# Patient Record
Sex: Female | Born: 1945 | Race: White | Hispanic: No | Smoking: Former smoker
Health system: Southern US, Community
[De-identification: ages and names within clinical notes are randomized; demographics above are authoritative.]

## PROBLEM LIST (undated history)

## (undated) DIAGNOSIS — T7840XA Allergy, unspecified, initial encounter: Secondary | ICD-10-CM

## (undated) DIAGNOSIS — M8580 Other specified disorders of bone density and structure, unspecified site: Secondary | ICD-10-CM

## (undated) DIAGNOSIS — IMO0001 Reserved for inherently not codable concepts without codable children: Secondary | ICD-10-CM

## (undated) DIAGNOSIS — E559 Vitamin D deficiency, unspecified: Secondary | ICD-10-CM

## (undated) DIAGNOSIS — C801 Malignant (primary) neoplasm, unspecified: Secondary | ICD-10-CM

## (undated) DIAGNOSIS — Z880 Allergy status to penicillin: Secondary | ICD-10-CM

## (undated) DIAGNOSIS — Z5189 Encounter for other specified aftercare: Secondary | ICD-10-CM

## (undated) DIAGNOSIS — E039 Hypothyroidism, unspecified: Secondary | ICD-10-CM

## (undated) DIAGNOSIS — E785 Hyperlipidemia, unspecified: Secondary | ICD-10-CM

## (undated) DIAGNOSIS — R809 Proteinuria, unspecified: Secondary | ICD-10-CM

## (undated) DIAGNOSIS — S9305XA Dislocation of left ankle joint, initial encounter: Secondary | ICD-10-CM

## (undated) DIAGNOSIS — I6522 Occlusion and stenosis of left carotid artery: Secondary | ICD-10-CM

## (undated) DIAGNOSIS — G629 Polyneuropathy, unspecified: Secondary | ICD-10-CM

## (undated) DIAGNOSIS — E079 Disorder of thyroid, unspecified: Secondary | ICD-10-CM

## (undated) DIAGNOSIS — Z860100 Personal history of colon polyps, unspecified: Secondary | ICD-10-CM

## (undated) DIAGNOSIS — Z87442 Personal history of urinary calculi: Secondary | ICD-10-CM

## (undated) DIAGNOSIS — B351 Tinea unguium: Secondary | ICD-10-CM

## (undated) DIAGNOSIS — L570 Actinic keratosis: Secondary | ICD-10-CM

## (undated) DIAGNOSIS — G4730 Sleep apnea, unspecified: Secondary | ICD-10-CM

## (undated) DIAGNOSIS — R112 Nausea with vomiting, unspecified: Secondary | ICD-10-CM

## (undated) DIAGNOSIS — M1990 Unspecified osteoarthritis, unspecified site: Secondary | ICD-10-CM

## (undated) DIAGNOSIS — Z9889 Other specified postprocedural states: Secondary | ICD-10-CM

## (undated) HISTORY — DX: Disorder of thyroid, unspecified: E07.9

## (undated) HISTORY — DX: Other specified disorders of bone density and structure, unspecified site: M85.80

## (undated) HISTORY — DX: Actinic keratosis: L57.0

## (undated) HISTORY — PX: COLONOSCOPY: SHX174

## (undated) HISTORY — PX: POLYPECTOMY: SHX149

## (undated) HISTORY — PX: EYE SURGERY: SHX253

## (undated) HISTORY — DX: Personal history of colon polyps, unspecified: Z86.0100

## (undated) HISTORY — DX: Unspecified osteoarthritis, unspecified site: M19.90

## (undated) HISTORY — DX: Vitamin D deficiency, unspecified: E55.9

## (undated) HISTORY — DX: Proteinuria, unspecified: R80.9

## (undated) HISTORY — DX: Hyperlipidemia, unspecified: E78.5

## (undated) HISTORY — PX: TOTAL ABDOMINAL HYSTERECTOMY W/ BILATERAL SALPINGOOPHORECTOMY: SHX83

## (undated) HISTORY — DX: Occlusion and stenosis of left carotid artery: I65.22

## (undated) HISTORY — DX: Tinea unguium: B35.1

## (undated) HISTORY — DX: Polyneuropathy, unspecified: G62.9

## (undated) HISTORY — DX: Allergy status to penicillin: Z88.0

## (undated) HISTORY — PX: LAPAROSCOPIC GASTRIC BANDING: SHX1100

## (undated) HISTORY — DX: Reserved for inherently not codable concepts without codable children: IMO0001

## (undated) HISTORY — DX: Allergy, unspecified, initial encounter: T78.40XA

## (undated) HISTORY — DX: Encounter for other specified aftercare: Z51.89

## (undated) HISTORY — PX: WISDOM TOOTH EXTRACTION: SHX21

---

## 1978-10-13 HISTORY — PX: TOTAL ABDOMINAL HYSTERECTOMY W/ BILATERAL SALPINGOOPHORECTOMY: SHX83

## 1998-08-13 ENCOUNTER — Encounter: Admission: RE | Admit: 1998-08-13 | Discharge: 1998-11-11 | Payer: Self-pay | Admitting: Internal Medicine

## 1998-09-03 ENCOUNTER — Ambulatory Visit (HOSPITAL_COMMUNITY): Admission: RE | Admit: 1998-09-03 | Discharge: 1998-09-03 | Payer: Self-pay | Admitting: Internal Medicine

## 1999-05-16 ENCOUNTER — Ambulatory Visit (HOSPITAL_COMMUNITY): Admission: RE | Admit: 1999-05-16 | Discharge: 1999-05-16 | Payer: Self-pay | Admitting: Internal Medicine

## 1999-05-16 ENCOUNTER — Encounter: Payer: Self-pay | Admitting: Internal Medicine

## 1999-09-03 ENCOUNTER — Encounter (INDEPENDENT_AMBULATORY_CARE_PROVIDER_SITE_OTHER): Payer: Self-pay

## 1999-09-03 ENCOUNTER — Ambulatory Visit (HOSPITAL_COMMUNITY): Admission: RE | Admit: 1999-09-03 | Discharge: 1999-09-03 | Payer: Self-pay | Admitting: *Deleted

## 1999-09-24 ENCOUNTER — Other Ambulatory Visit: Admission: RE | Admit: 1999-09-24 | Discharge: 1999-09-24 | Payer: Self-pay | Admitting: Internal Medicine

## 2001-08-19 ENCOUNTER — Ambulatory Visit (HOSPITAL_COMMUNITY): Admission: RE | Admit: 2001-08-19 | Discharge: 2001-08-19 | Payer: Self-pay | Admitting: Internal Medicine

## 2001-08-19 ENCOUNTER — Encounter: Payer: Self-pay | Admitting: Internal Medicine

## 2001-11-13 ENCOUNTER — Emergency Department (HOSPITAL_COMMUNITY): Admission: EM | Admit: 2001-11-13 | Discharge: 2001-11-14 | Payer: Self-pay | Admitting: Emergency Medicine

## 2001-11-14 ENCOUNTER — Emergency Department (HOSPITAL_COMMUNITY): Admission: EM | Admit: 2001-11-14 | Discharge: 2001-11-14 | Payer: Self-pay | Admitting: Emergency Medicine

## 2001-11-14 ENCOUNTER — Encounter: Payer: Self-pay | Admitting: Emergency Medicine

## 2001-11-17 ENCOUNTER — Encounter: Admission: RE | Admit: 2001-11-17 | Discharge: 2001-11-17 | Payer: Self-pay | Admitting: Urology

## 2001-11-17 ENCOUNTER — Encounter: Payer: Self-pay | Admitting: Urology

## 2001-11-18 ENCOUNTER — Ambulatory Visit (HOSPITAL_BASED_OUTPATIENT_CLINIC_OR_DEPARTMENT_OTHER): Admission: RE | Admit: 2001-11-18 | Discharge: 2001-11-18 | Payer: Self-pay | Admitting: Urology

## 2001-12-29 ENCOUNTER — Encounter: Admission: RE | Admit: 2001-12-29 | Discharge: 2002-01-03 | Payer: Self-pay | Admitting: Pediatrics

## 2002-08-30 ENCOUNTER — Ambulatory Visit (HOSPITAL_COMMUNITY): Admission: RE | Admit: 2002-08-30 | Discharge: 2002-08-30 | Payer: Self-pay | Admitting: Internal Medicine

## 2002-08-30 ENCOUNTER — Encounter: Payer: Self-pay | Admitting: Internal Medicine

## 2003-03-15 ENCOUNTER — Emergency Department (HOSPITAL_COMMUNITY): Admission: EM | Admit: 2003-03-15 | Discharge: 2003-03-15 | Payer: Self-pay | Admitting: Emergency Medicine

## 2003-03-15 ENCOUNTER — Encounter: Payer: Self-pay | Admitting: Emergency Medicine

## 2005-03-19 ENCOUNTER — Encounter: Admission: RE | Admit: 2005-03-19 | Discharge: 2005-06-17 | Payer: Self-pay | Admitting: Internal Medicine

## 2005-04-09 ENCOUNTER — Encounter: Admission: RE | Admit: 2005-04-09 | Discharge: 2005-04-09 | Payer: Self-pay | Admitting: Internal Medicine

## 2005-04-09 IMAGING — RF DG ESOPHAGUS
11 of 16 series · 15 of 24 positions shown · non-contrast
Comparison: none

CLINICAL DATA: Dysphagia.
 DOUBLE CONTRAST ESOPHAGRAM ? [DATE]:
 No prior studies.

[Series 1: run · 3 of 8 slices shown (1 of 11)]
[im 1/8]
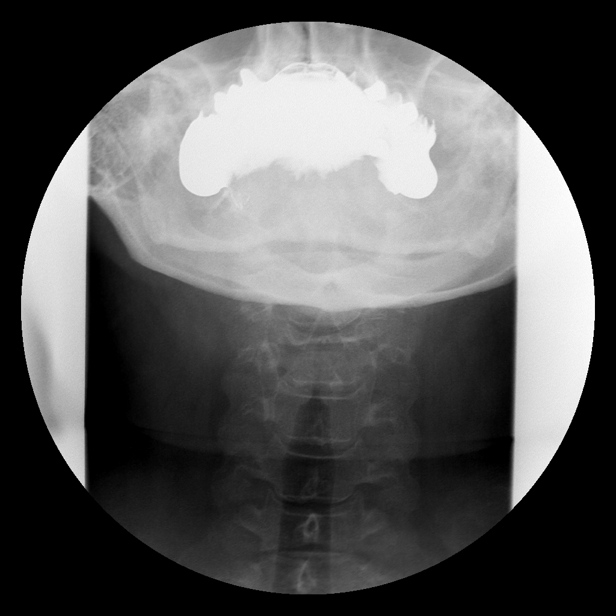
[im 4/8]
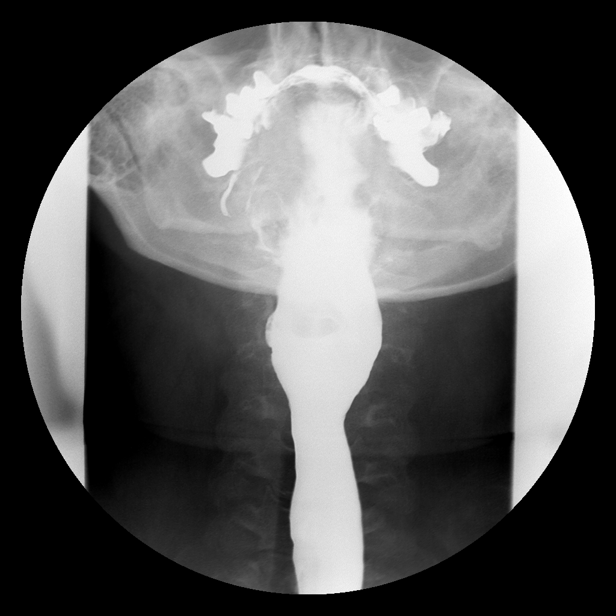
[im 8/8]
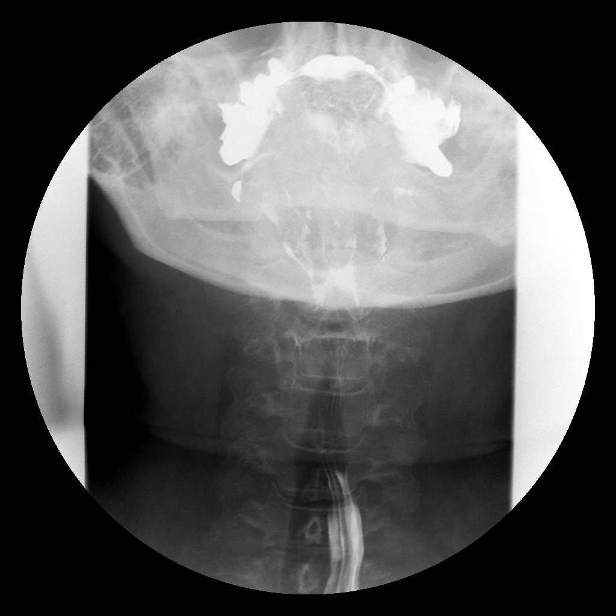

[Series 2: run · 3 of 8 slices shown (2 of 11)]
[im 1/8]
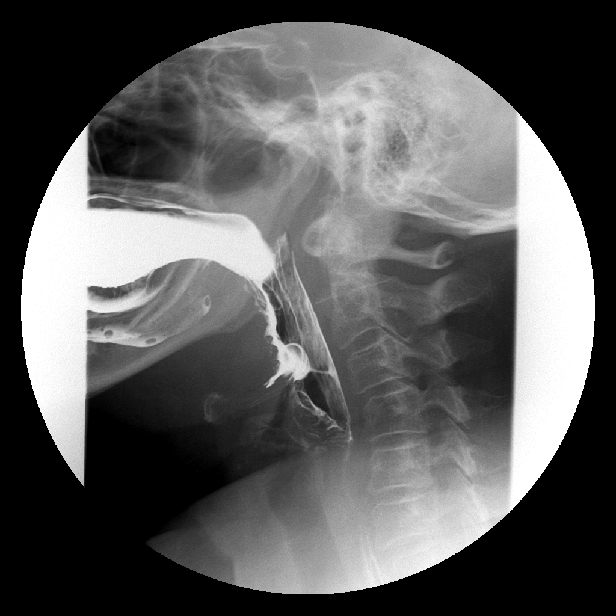
[im 4/8]
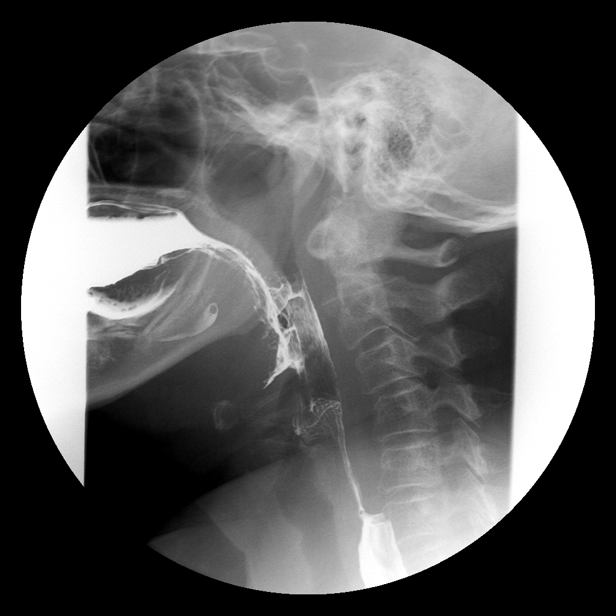
[im 6/8]
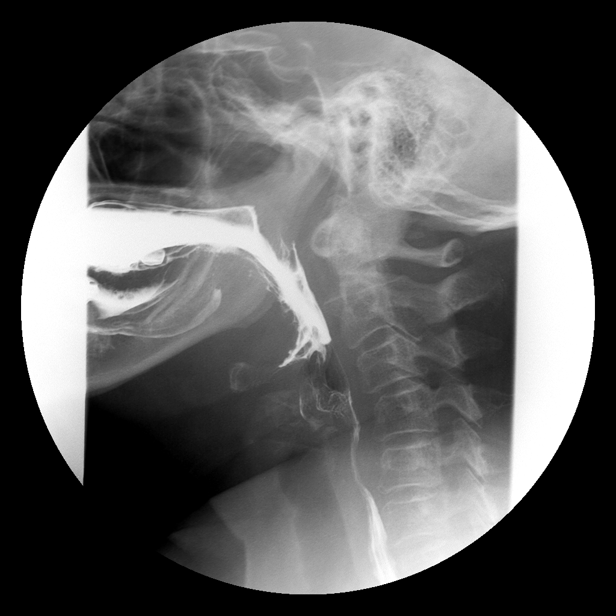

[Series 3: run · 1 of 1 slices shown (3 of 11)]
[im 1/1]
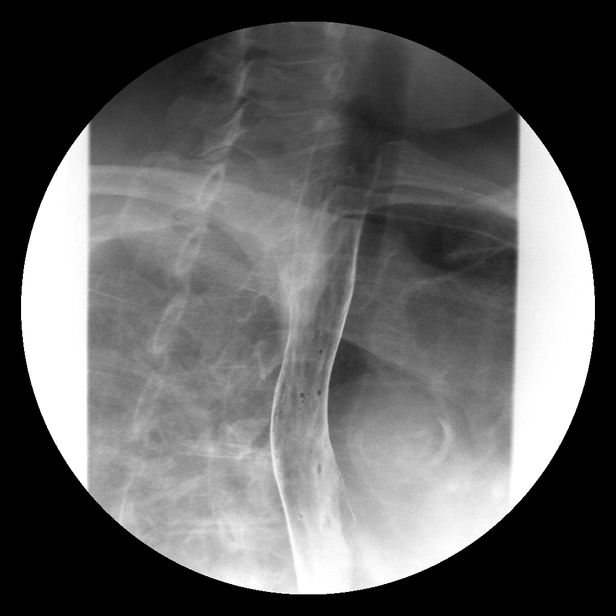

[Series 5: run · 1 of 1 slices shown (4 of 11)]
[im 1/1]
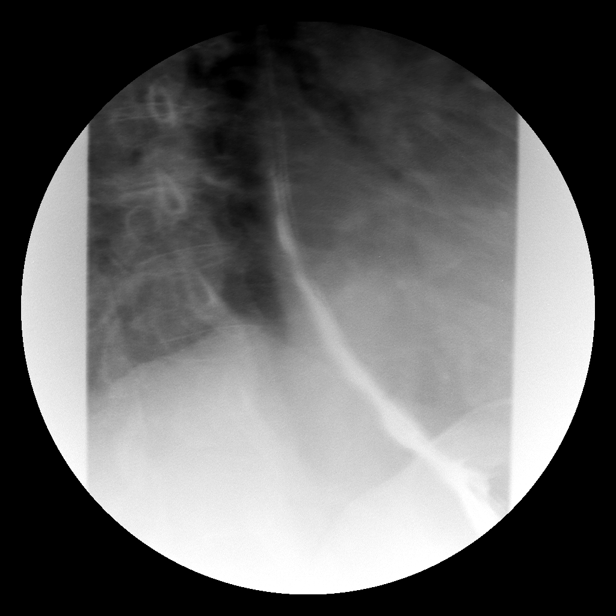

[Series 6: run · 1 of 1 slices shown (5 of 11)]
[im 1/1]
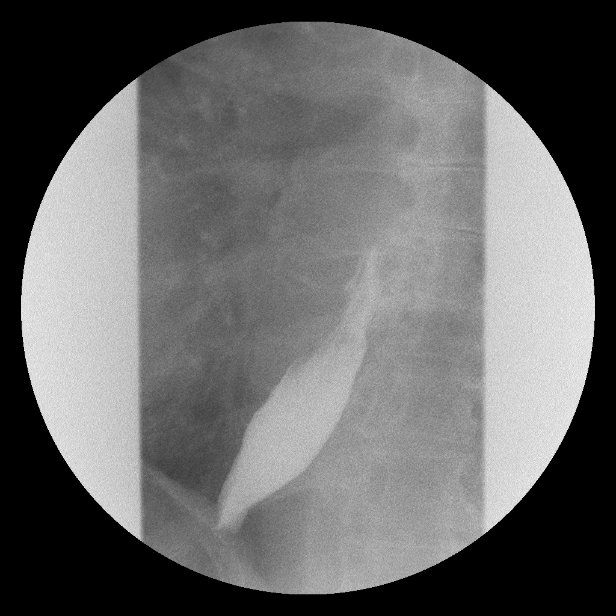

[Series 8: run · 1 of 1 slices shown (6 of 11)]
[im 1/1]
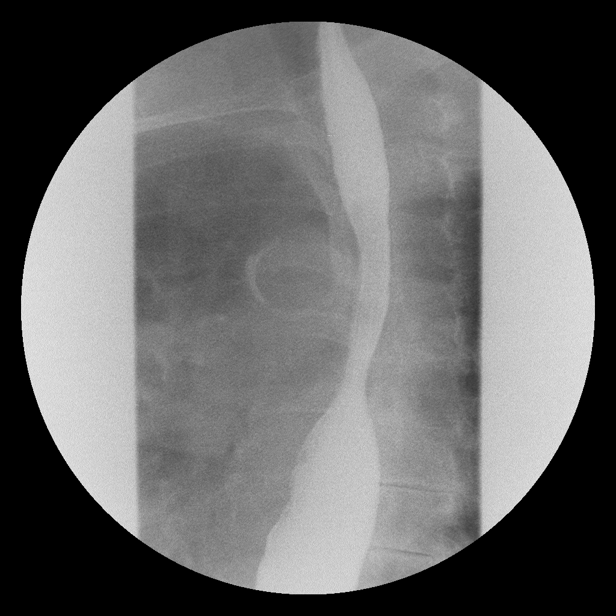

[Series 9: run · 1 of 1 slices shown (7 of 11)]
[im 1/1]
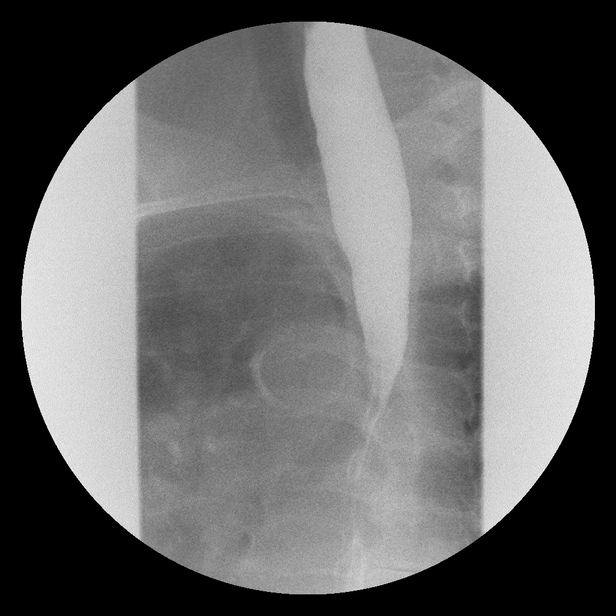

[Series 11: run · 1 of 1 slices shown (8 of 11)]
[im 1/1]
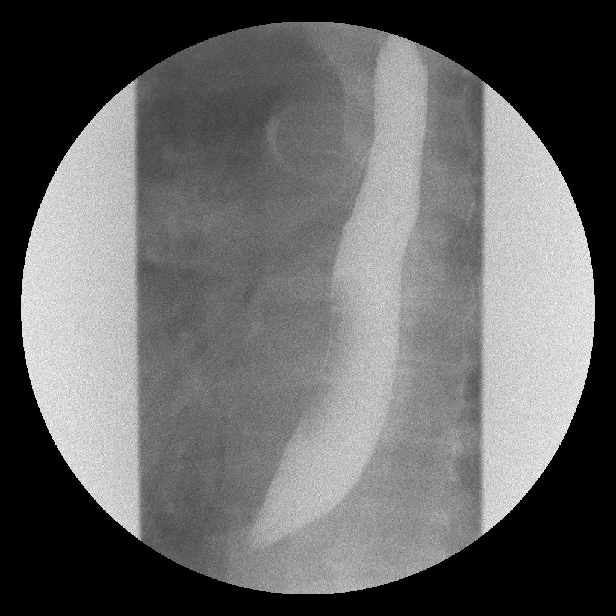

[Series 13: run · 1 of 1 slices shown (9 of 11)]
[im 1/1]
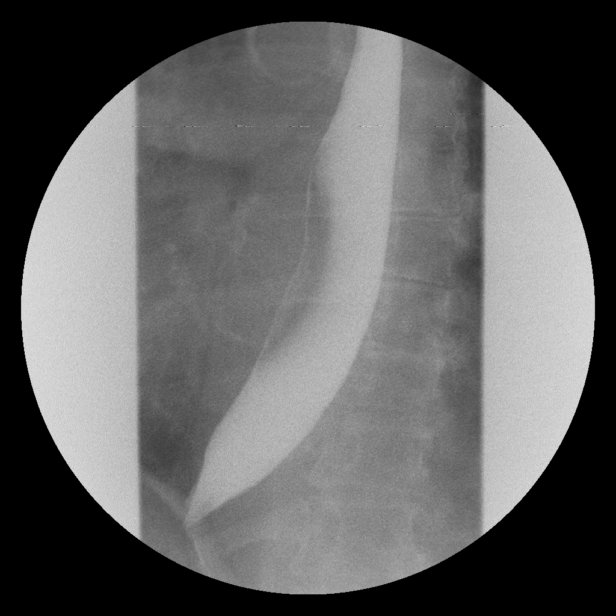

[Series 14: run · 1 of 1 slices shown (10 of 11)]
[im 1/1]
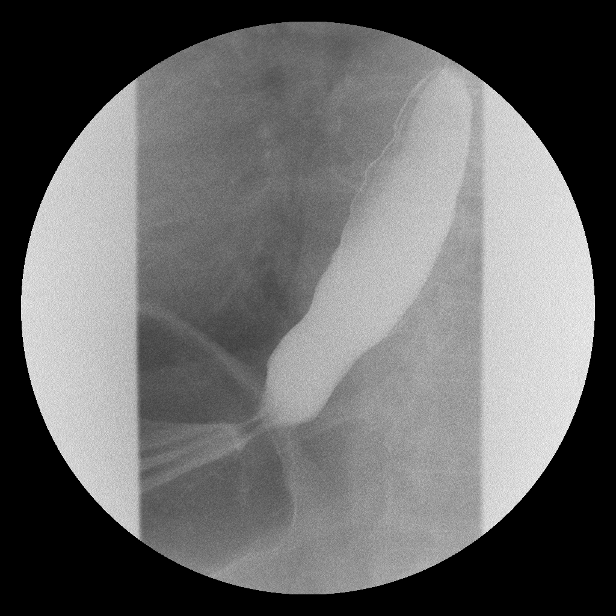

[Series 16: run · 1 of 1 slices shown (11 of 11)]
[im 1/1]
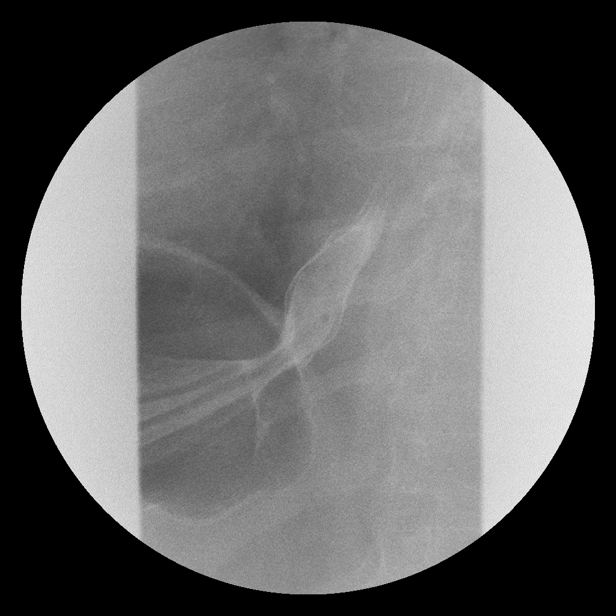

[15 of 24 positions shown; findings below may reference images not displayed]

FINDINGS: There is a moderately prominent cricopharyngeus.  The pharyngeal phase of swallowing appears otherwise unremarkable.  
 Atherosclerotic calcification of the thoracic aorta is noted.  No significant narrowing of the thoracic esophagus. Primary peristaltic waves in the esophagus were normal on four out of four swallows.  There is a very small sliding type hiatal hernia.
IMPRESSION: 1.  Very small transient hiatal hernia.

## 2005-05-19 ENCOUNTER — Ambulatory Visit (HOSPITAL_COMMUNITY): Admission: RE | Admit: 2005-05-19 | Discharge: 2005-05-19 | Payer: Self-pay | Admitting: *Deleted

## 2005-05-30 ENCOUNTER — Encounter: Admission: RE | Admit: 2005-05-30 | Discharge: 2005-08-28 | Payer: Self-pay | Admitting: *Deleted

## 2005-09-16 ENCOUNTER — Encounter (INDEPENDENT_AMBULATORY_CARE_PROVIDER_SITE_OTHER): Payer: Self-pay | Admitting: *Deleted

## 2005-09-16 ENCOUNTER — Ambulatory Visit (HOSPITAL_COMMUNITY): Admission: RE | Admit: 2005-09-16 | Discharge: 2005-09-17 | Payer: Self-pay | Admitting: *Deleted

## 2005-09-17 IMAGING — CR DG UGI W/ GASTROGRAFIN
11 series · 11 of 11 positions shown · non-contrast
Comparison: none

CLINICAL DATA: Morbid obesity. Gastric banding.  
UPPER GI WITH GASTROGRAFIN:

[run (1 of 9)]
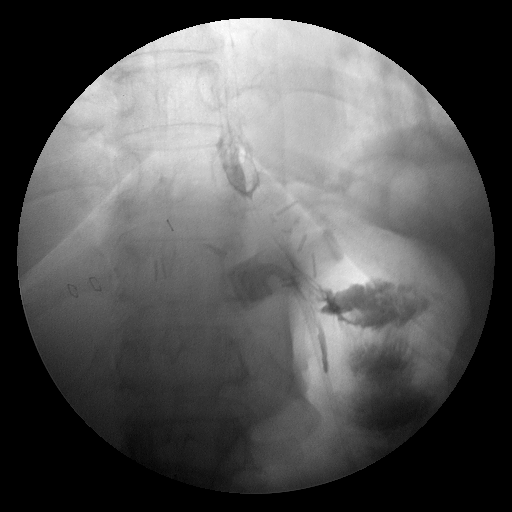

[run (2 of 9)]
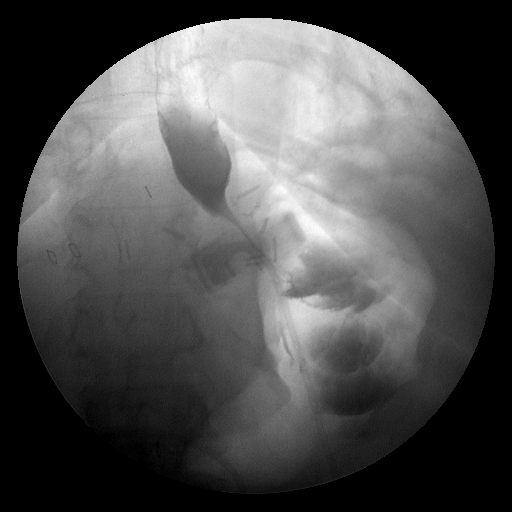

[run (3 of 9)]
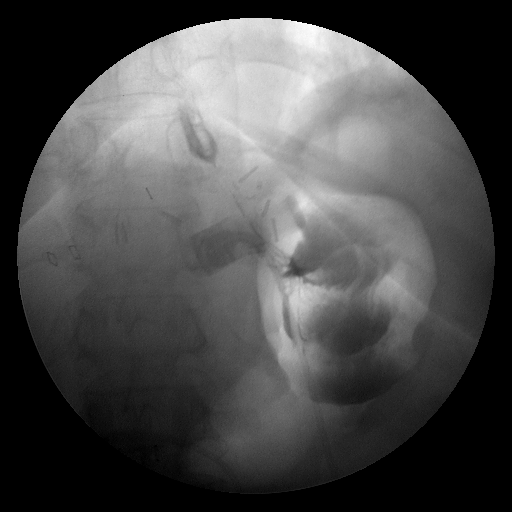

[run (4 of 9)]
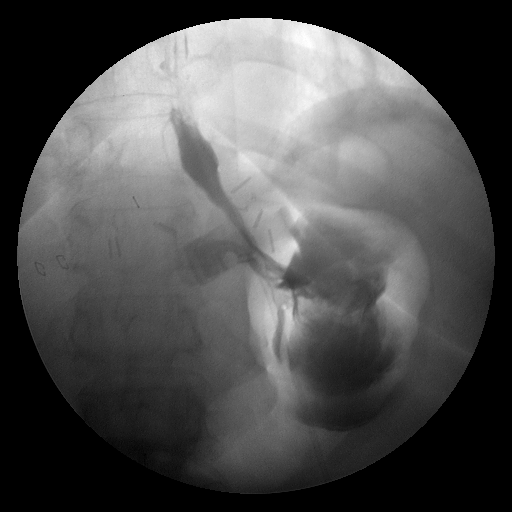

[run (5 of 9)]
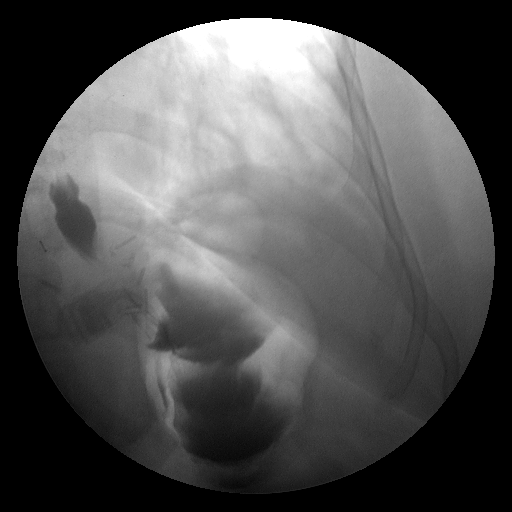

[run (6 of 9)]
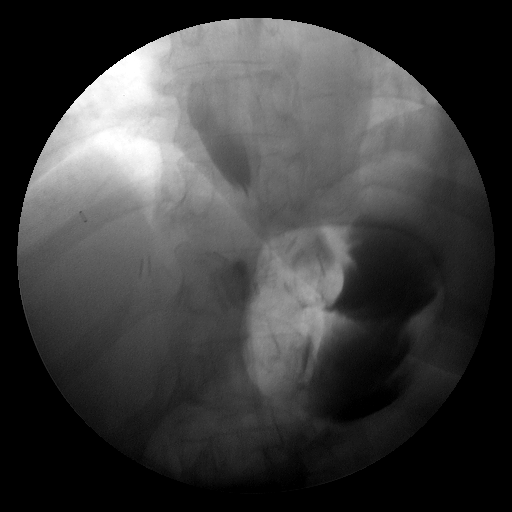

[run (7 of 9)]
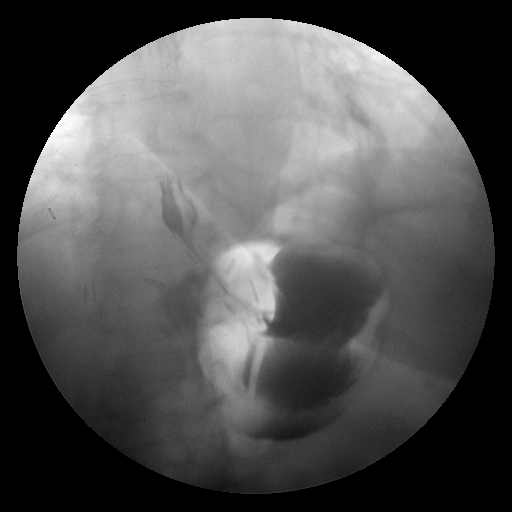

[run (8 of 9)]
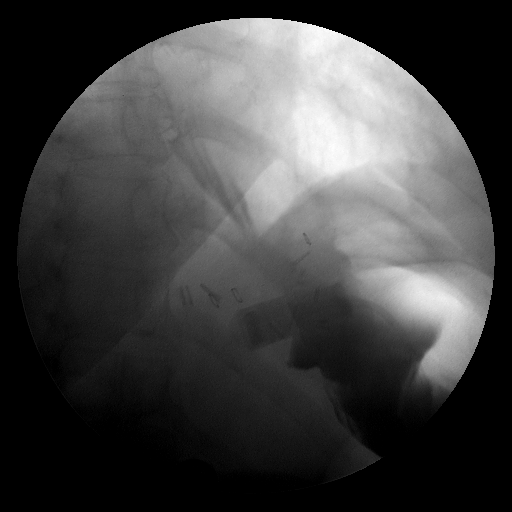

[run (9 of 9)]
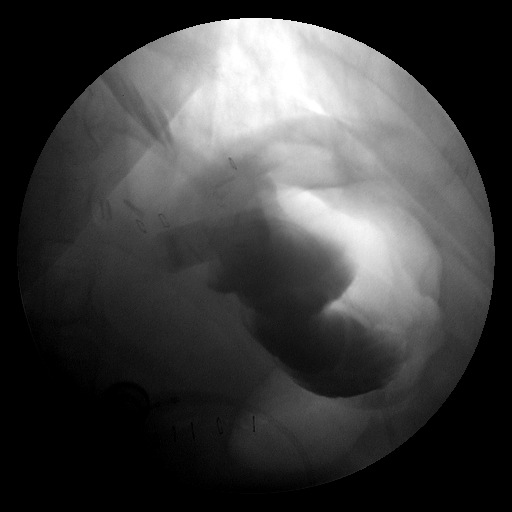

[view not recorded (1 of 2)]
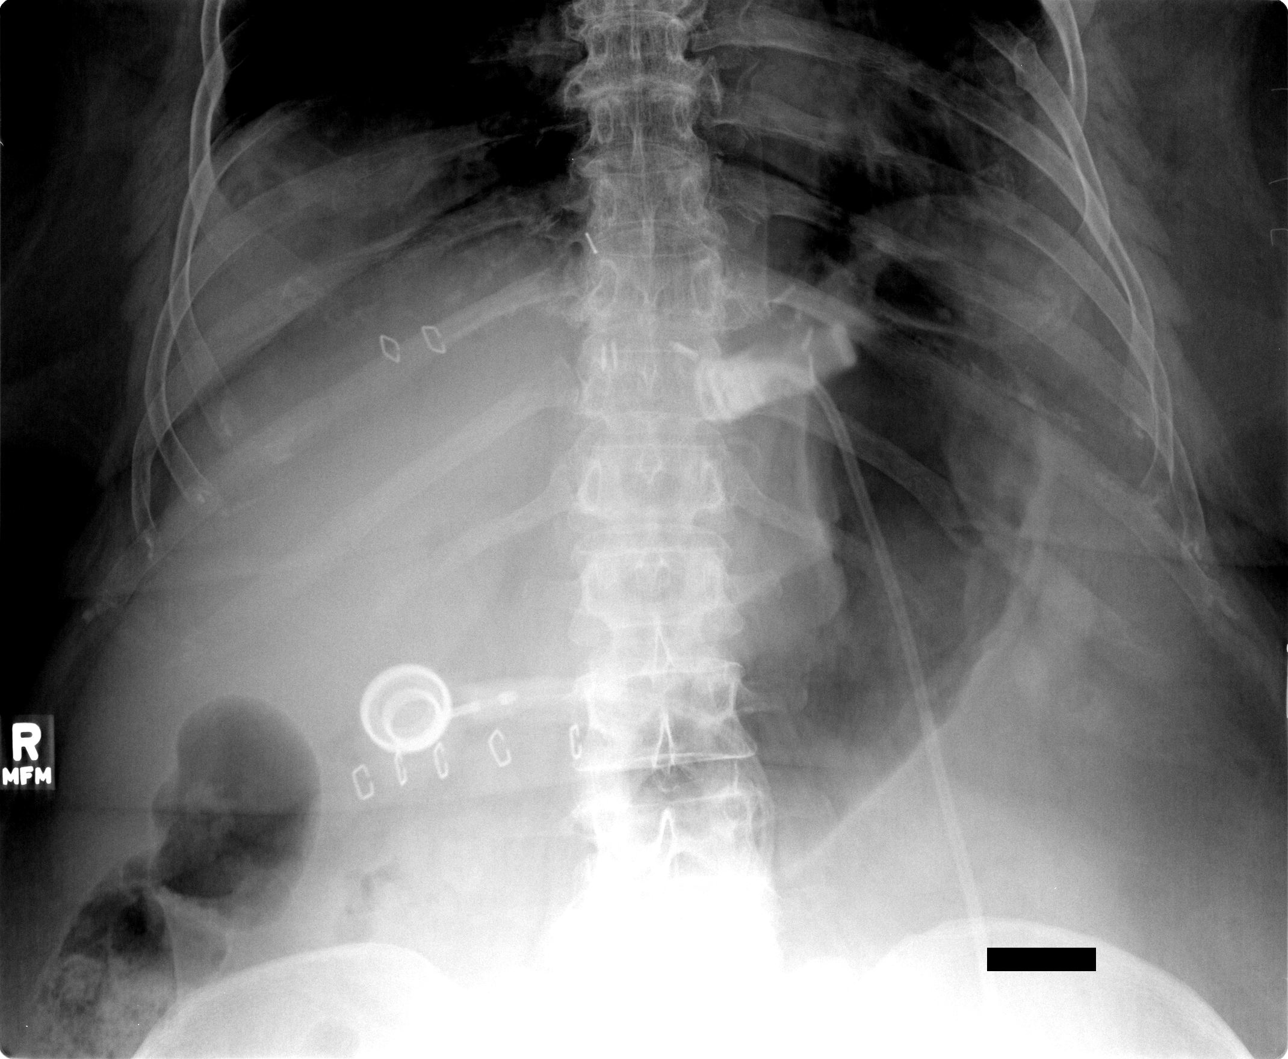

[view not recorded (2 of 2)]
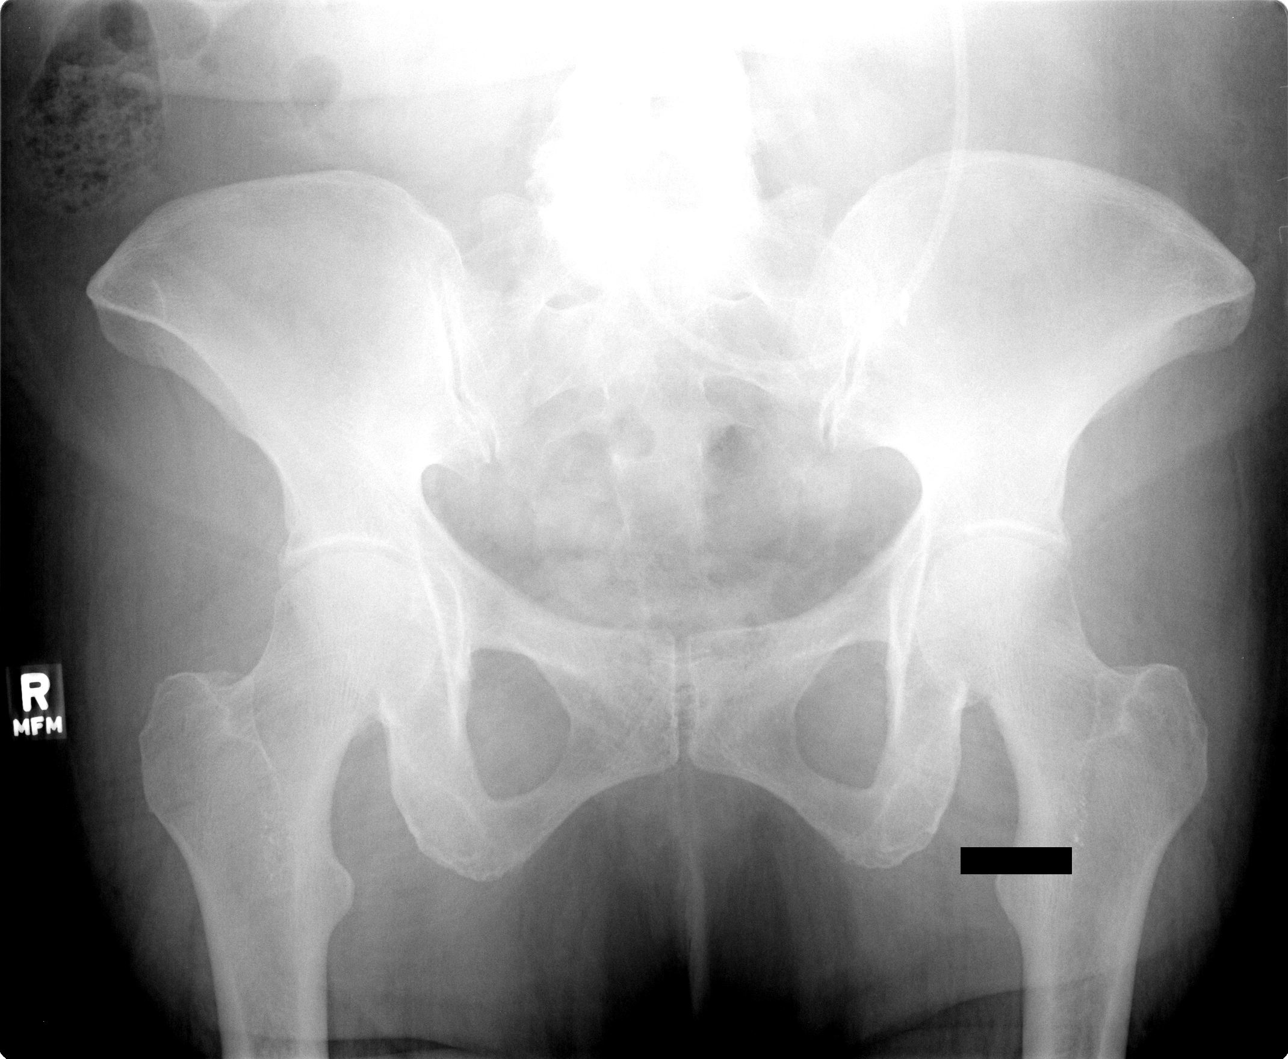

[11 of 11 positions shown; findings below may reference images not displayed]

FINDINGS: The patient was studied in semierect position under fluoroscopic control.     She is status post laparoscopic banding of the stomach.  Small and large gastric pouches are separated by gastric band.  There is no evidence of contrast extravasation or significant delay in transit of Gastrografin.
IMPRESSION: Status post gastric banding with no complications noted.

## 2005-09-26 ENCOUNTER — Encounter: Admission: RE | Admit: 2005-09-26 | Discharge: 2005-12-25 | Payer: Self-pay | Admitting: *Deleted

## 2005-10-20 ENCOUNTER — Ambulatory Visit: Payer: Self-pay | Admitting: Internal Medicine

## 2005-12-22 ENCOUNTER — Ambulatory Visit: Payer: Self-pay | Admitting: Internal Medicine

## 2006-02-10 ENCOUNTER — Encounter: Admission: RE | Admit: 2006-02-10 | Discharge: 2006-02-10 | Payer: Self-pay | Admitting: *Deleted

## 2006-07-30 ENCOUNTER — Encounter: Admission: RE | Admit: 2006-07-30 | Discharge: 2006-10-28 | Payer: Self-pay | Admitting: Internal Medicine

## 2007-11-25 ENCOUNTER — Encounter: Admission: RE | Admit: 2007-11-25 | Discharge: 2007-11-25 | Payer: Self-pay | Admitting: Internal Medicine

## 2007-11-25 IMAGING — US US SOFT TISSUE HEAD/NECK
1 series · 14 of 25 positions shown · non-contrast
Comparison: none

CLINICAL DATA: Goiter.
THYROID ULTRASOUND:
TECHNIQUE: Ultrasound examination of the thyroid gland and adjacent soft tissue structures was performed.
Right and left thyroid lobes measure 3.6 cm and 3.4 cm in length, respectively.  Right thyroid lobe measures 0.8 x 1.0 cm in transverse dimensions.  Left thyroid lobe measures 0.8 x 1.3 cm in transverse dimension.  The thyroid isthmus measures 3 mm in thickness.  Mild diffuse echotextural inhomogeneity of the thyroid gland.  The gland does not appear enlarged.  There is a small (5 x 4 x 5 mm) solid appearing nodule in the posterior aspect of the mid right thyroid lobe.  It is nearly isoechoic with the adjacent parenchyma.

[Series 1: us soft tissue head/neck · 0.08mm/px · 14 of 26 slices shown]
[im 1/26]
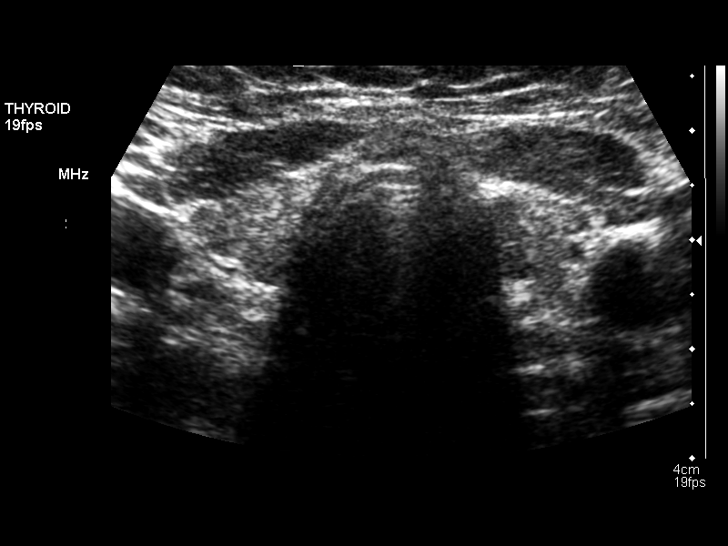
[im 3/26]
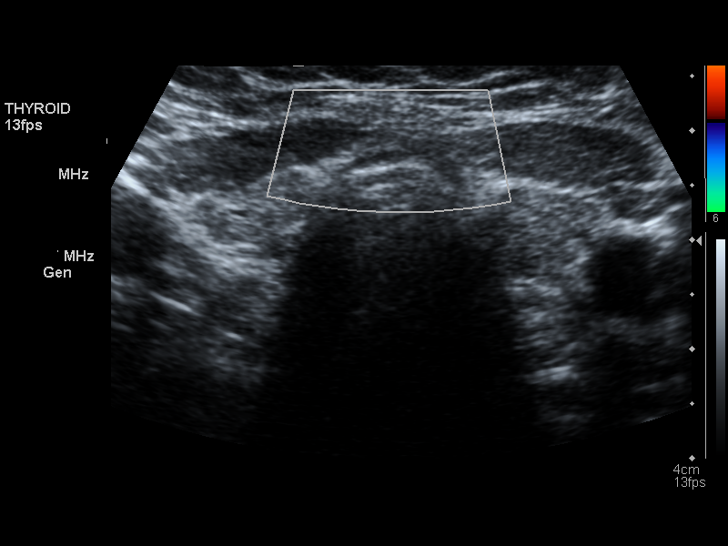
[im 5/26]
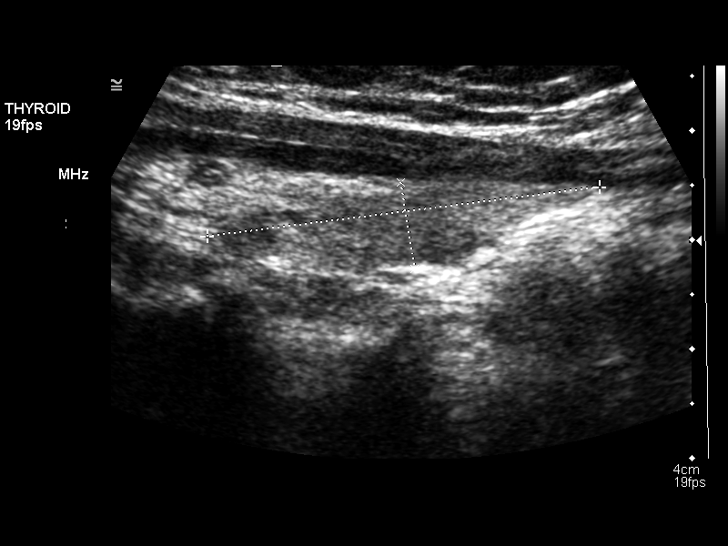
[im 7/26]
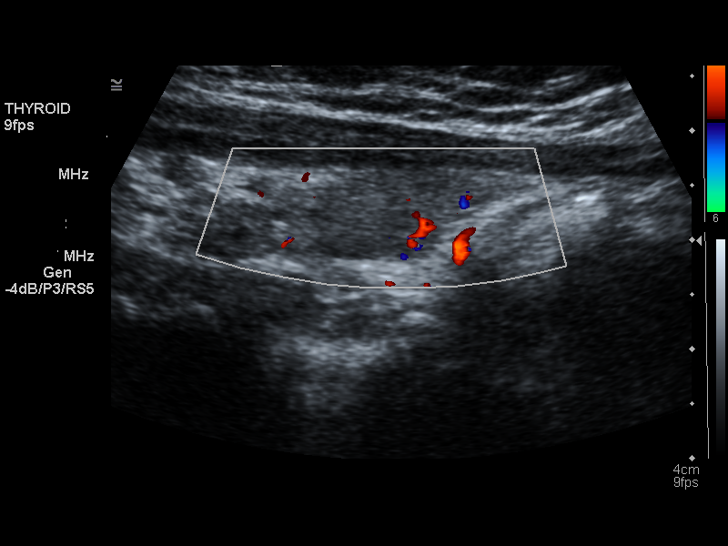
[im 9/26]
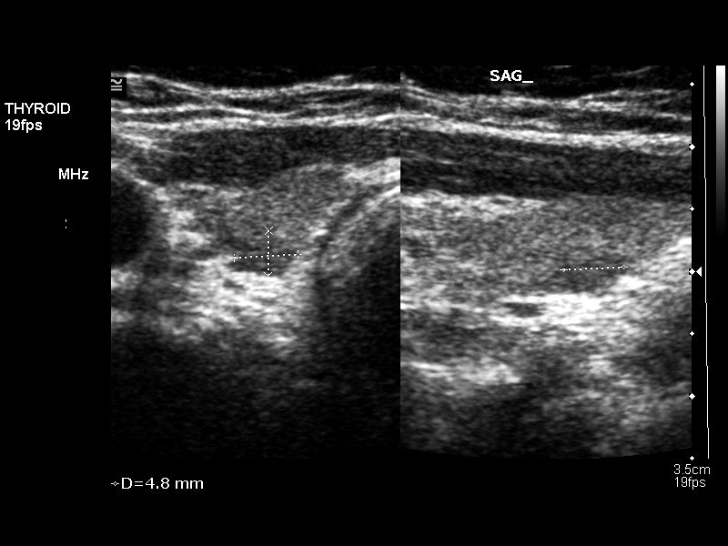
[im 10/26]
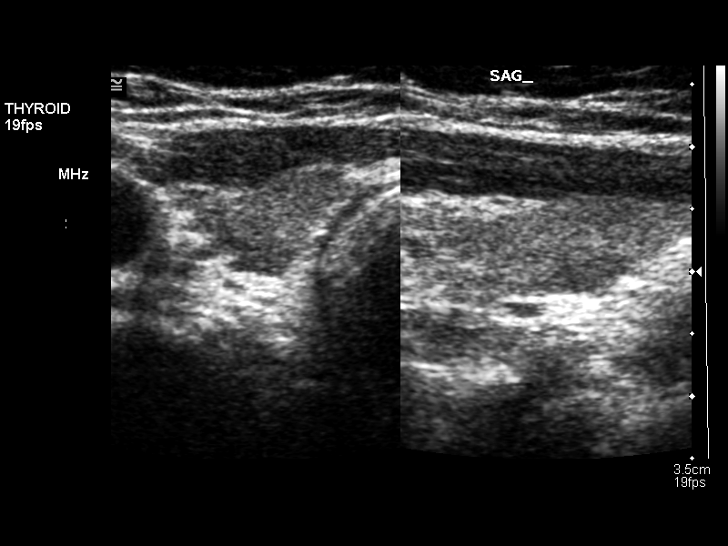
[im 12/26]
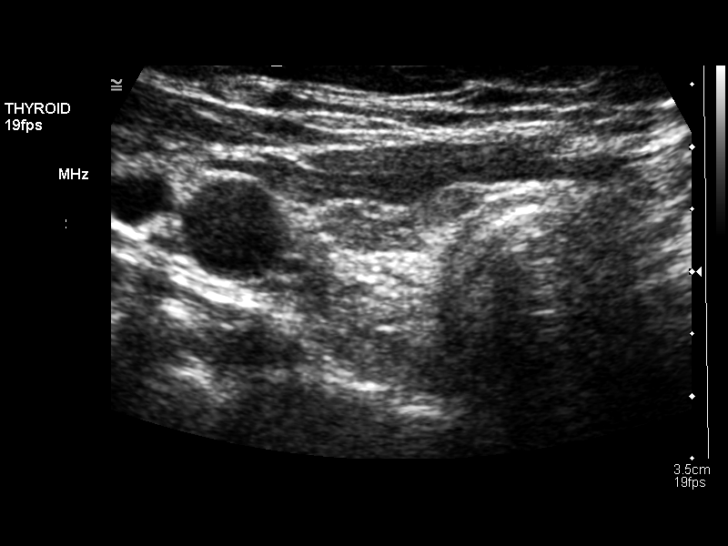
[im 14/26]
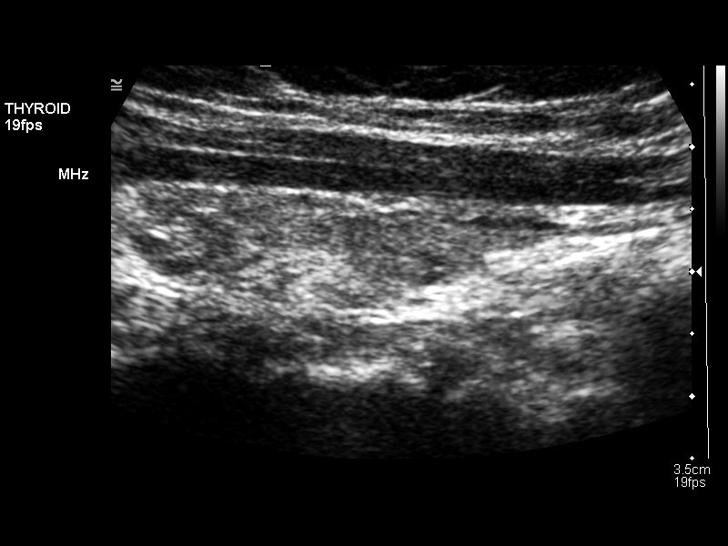
[im 16/26]
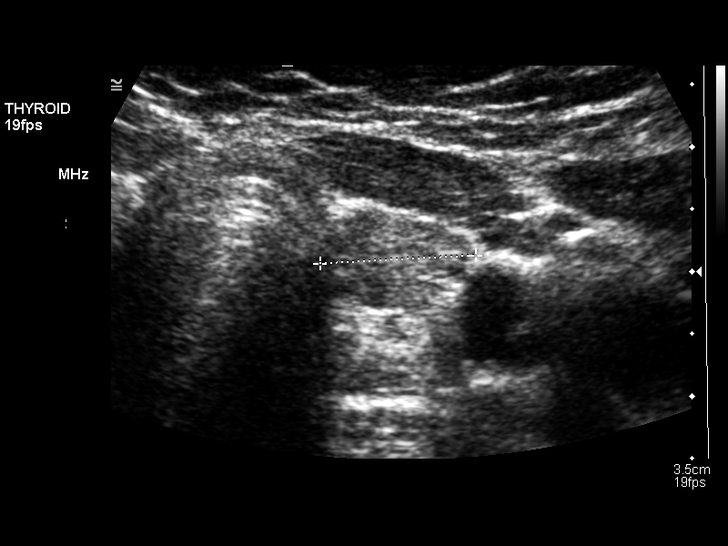
[im 17/26]
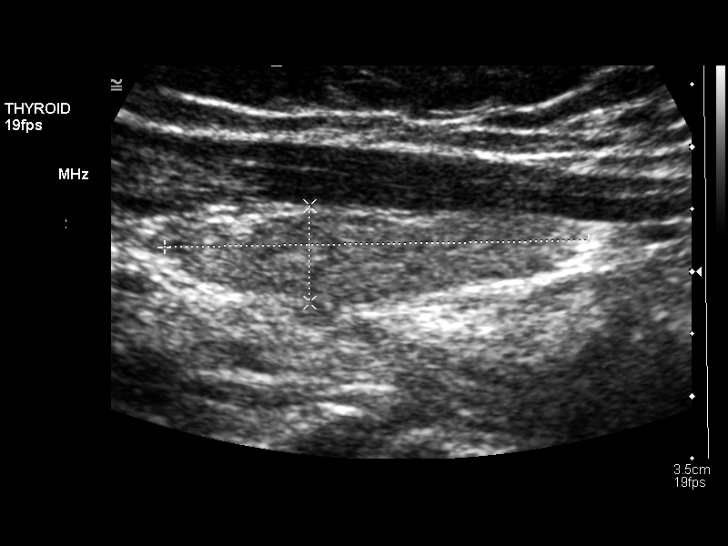
[im 19/26]
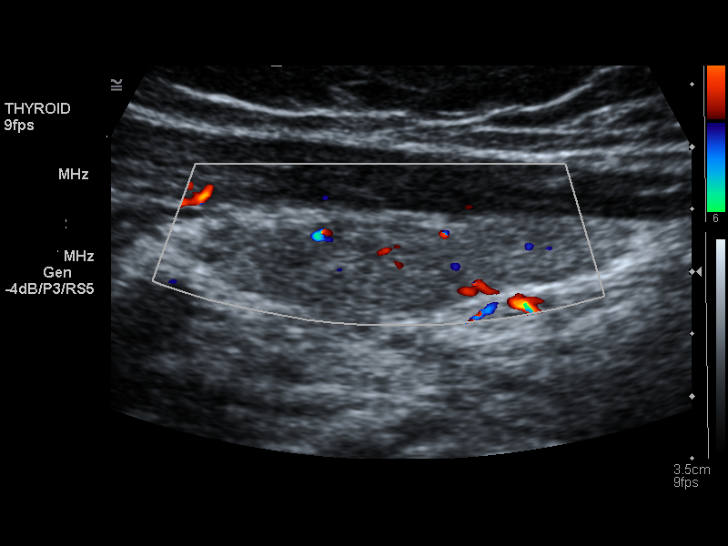
[im 21/26]
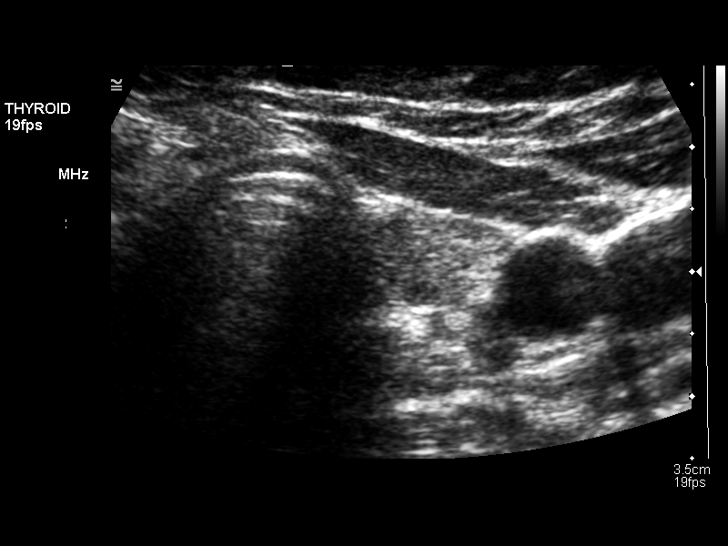
[im 23/26]
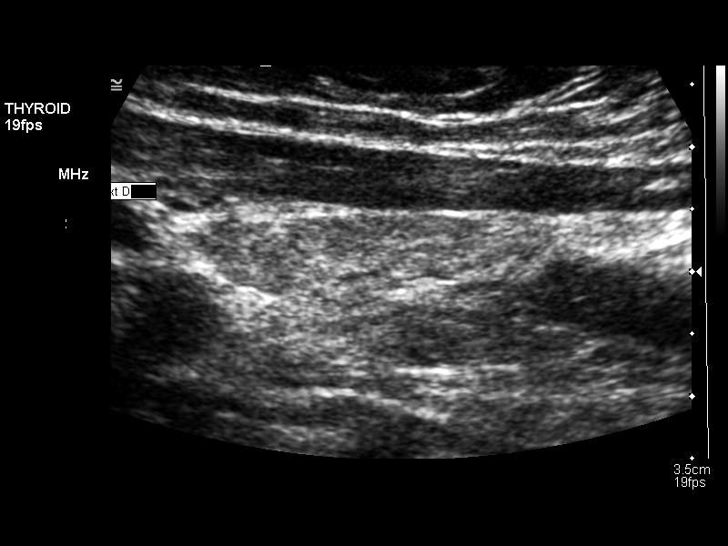
[im 26/26]
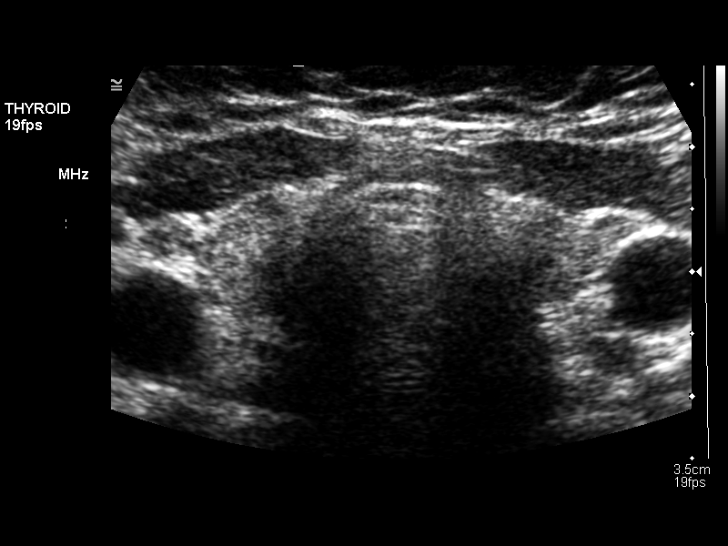

[14 of 25 positions shown; findings below may reference images not displayed]

IMPRESSION: The thyroid gland does not appear enlarged, but has a mildly diffuse inhomogeneous echotextural pattern plus there is a small right lobe nodular focus.  This may simply represent a focus of minimally altered echotexture/echogenicity as opposed to representing a true nodule.

## 2008-10-13 HISTORY — PX: LAPAROSCOPIC GASTRIC BANDING: SHX1100

## 2008-10-24 ENCOUNTER — Encounter: Admission: RE | Admit: 2008-10-24 | Discharge: 2008-10-24 | Payer: Self-pay | Admitting: Internal Medicine

## 2008-10-24 IMAGING — US US SOFT TISSUE HEAD/NECK
1 series · 14 of 25 positions shown · non-contrast
Comparison: [DATE].

CLINICAL DATA: Follow-up thyroid nodule

THYROID ULTRASOUND
TECHNIQUE: Ultrasound examination of the thyroid gland and
adjacent soft tissues was performed.

[Series 1: us soft tissue head/neck · 0.08mm/px · 14 of 31 slices shown]
[im 1/31]
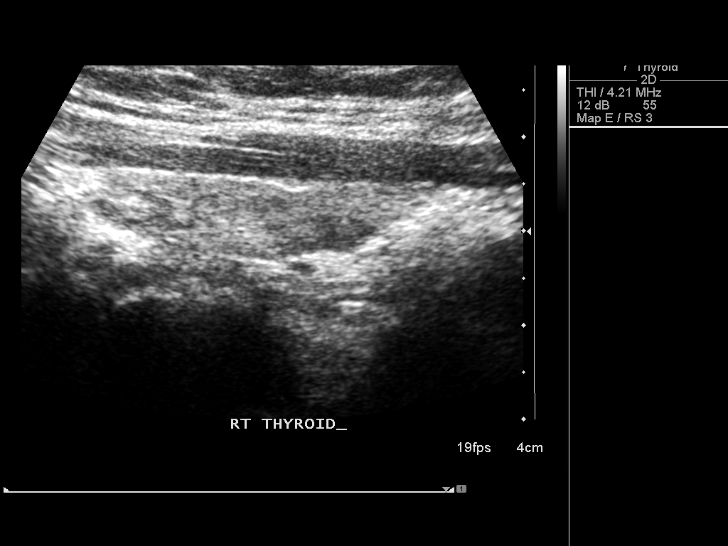
[im 3/31]
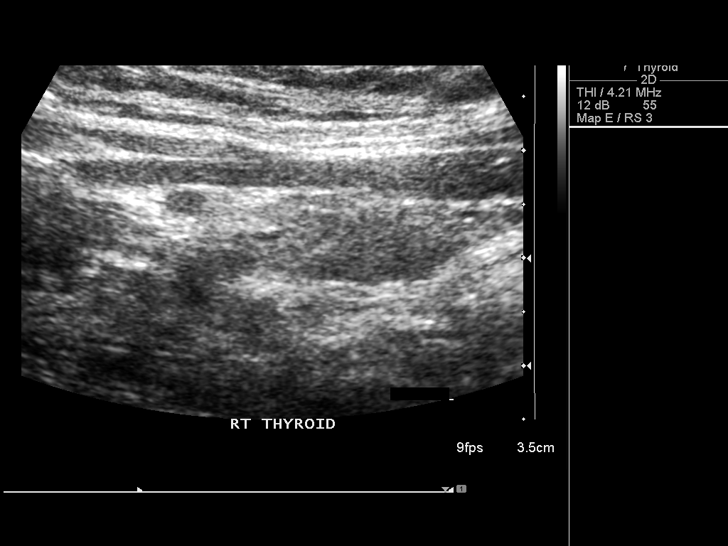
[im 6/31]
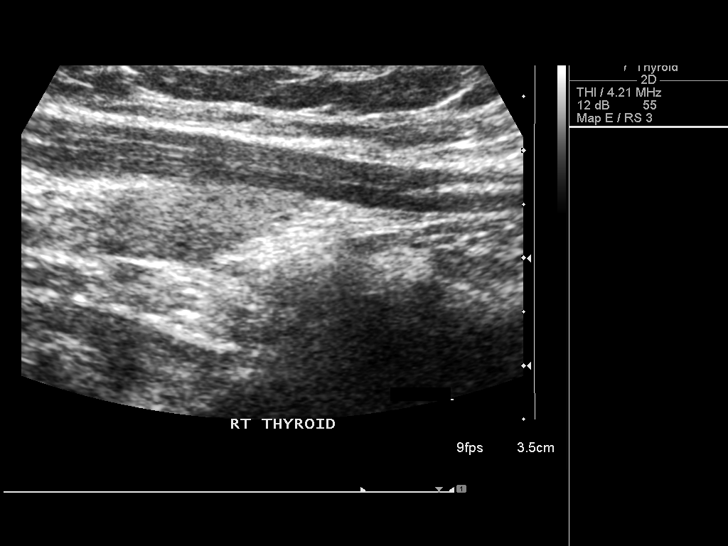
[im 8/31]
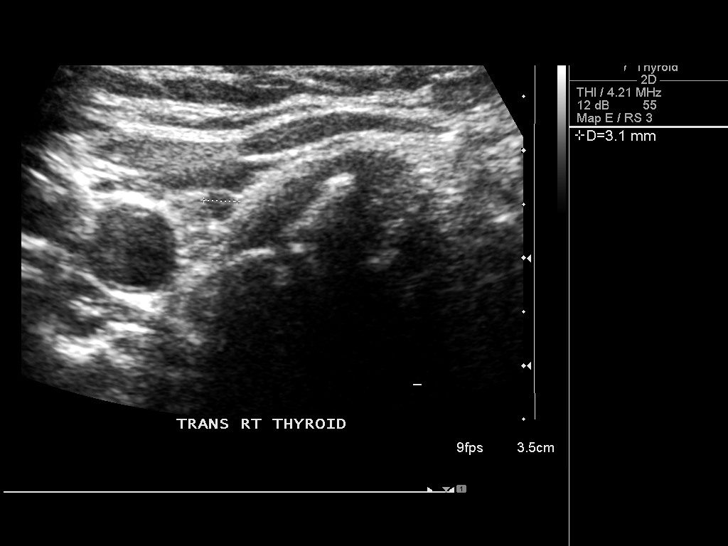
[im 11/31]
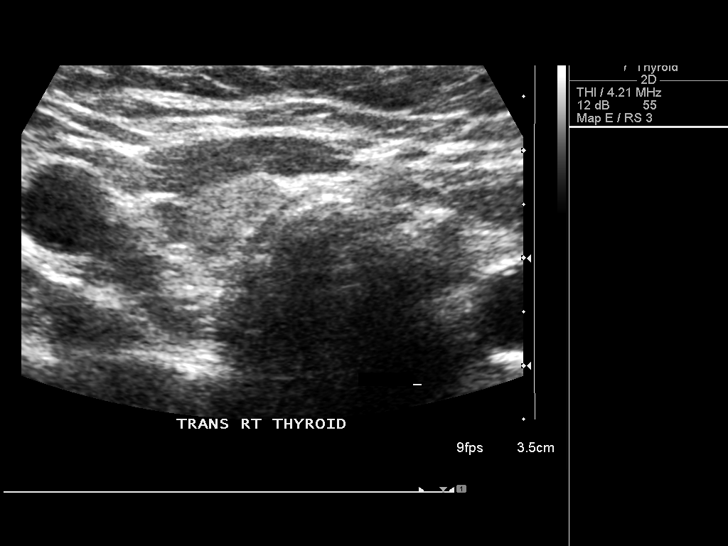
[im 12/31]
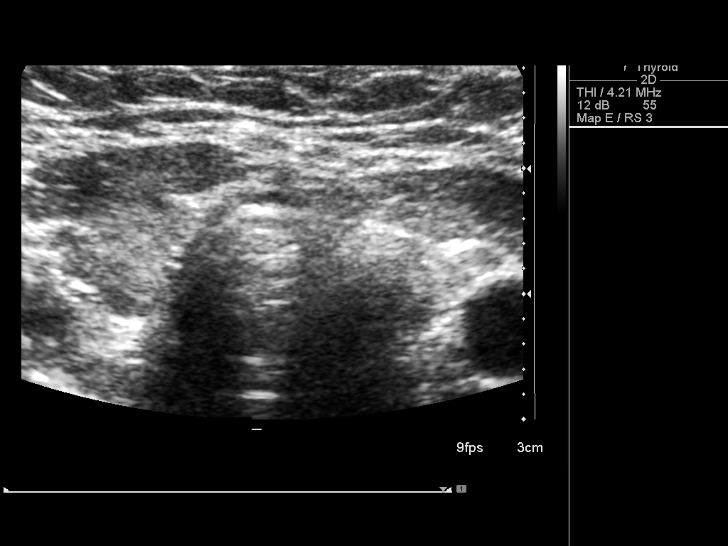
[im 14/31]
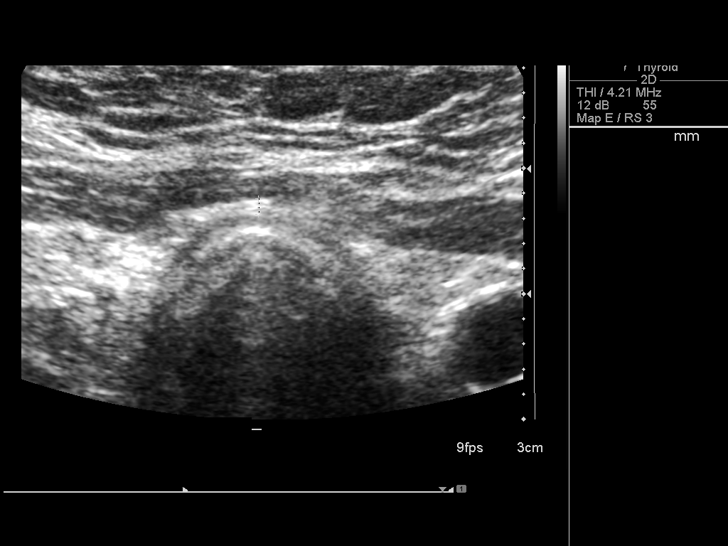
[im 17/31]
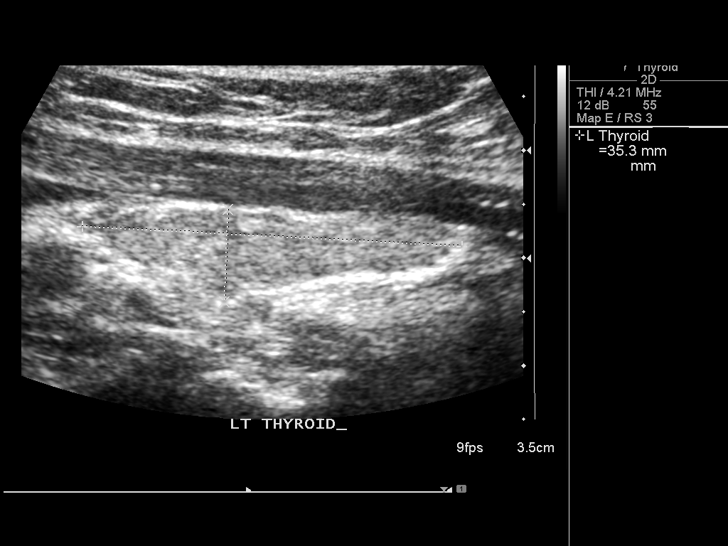
[im 19/31]
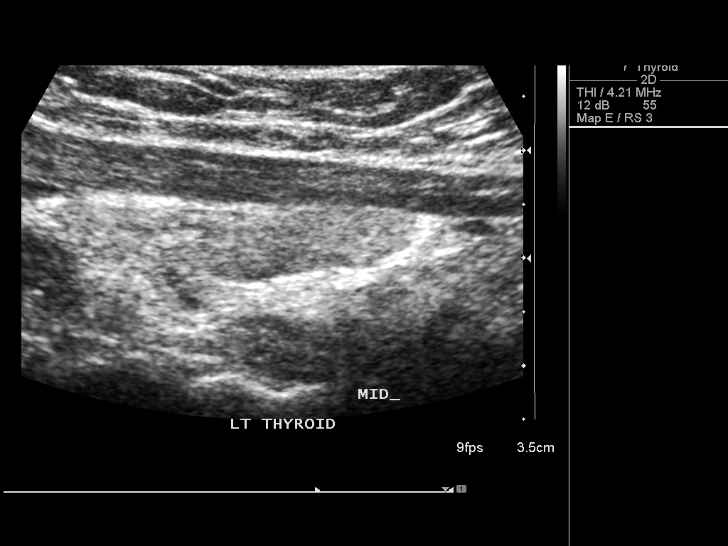
[im 21/31]
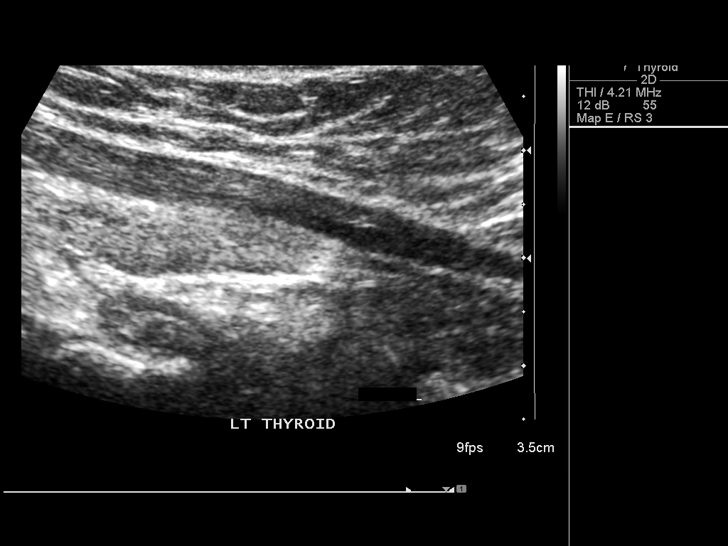
[im 23/31]
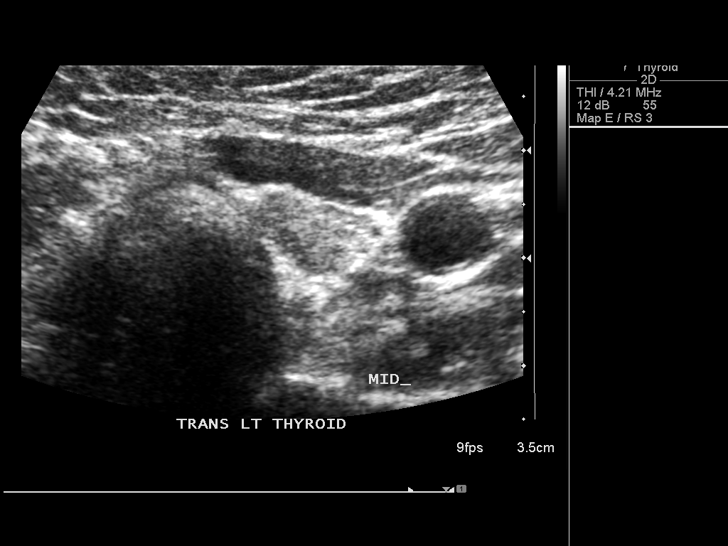
[im 26/31]
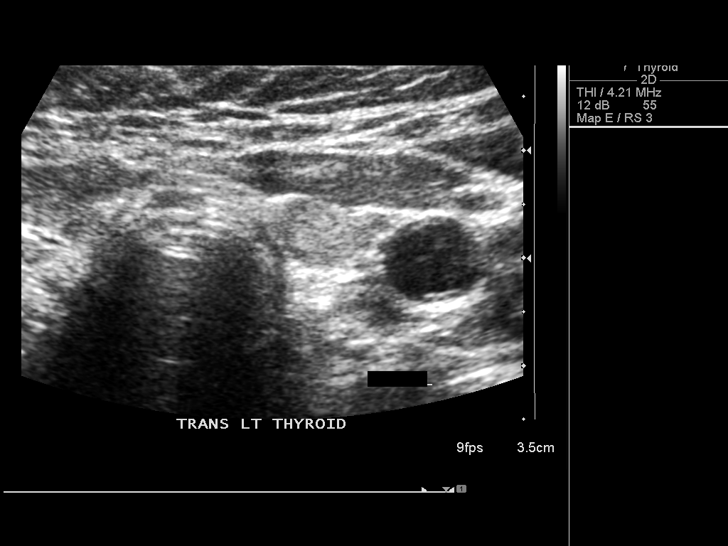
[im 28/31]
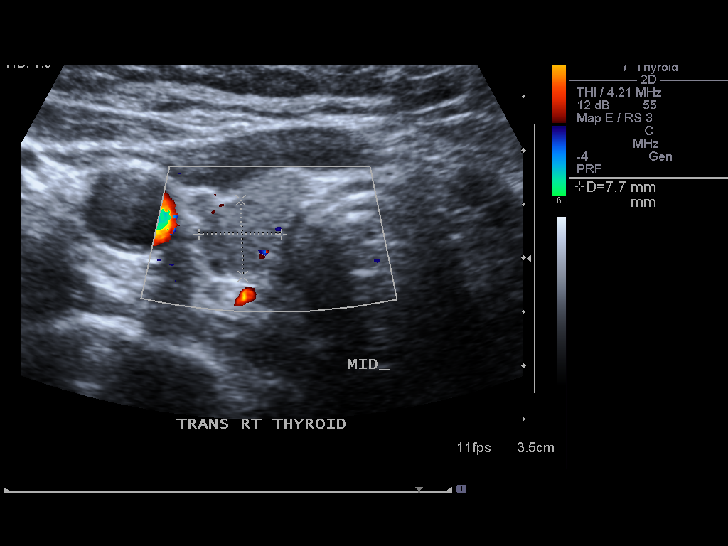
[im 31/31]
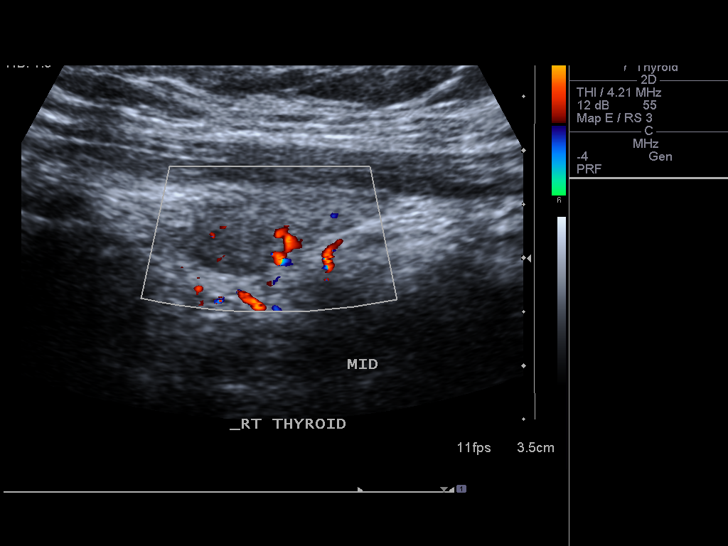

[14 of 25 positions shown; findings below may reference images not displayed]

FINDINGS: The right thyroid lobe measures 3.6 x 1.0 x 1.3 cm.  The left lobe
measures 3.50019 x 1.2 cm.  The isthmus is 2 mm in thickness.

Thyroid parenchyma is heterogeneous.  Two discrete nodules are
identified in the right thyroid lobe.  The larger of the two
measures 8 mm.  The smaller of the two is only about 4 mm, in the
upper pole.  No discrete nodules seen on the left.
IMPRESSION: Slight increase in size of the small right thyroid nodule seen
previously, measuring 8 mm today compared 5 mm previously.
Continued ultrasound surveillance could be used to assess for
continued growth.

## 2009-10-17 ENCOUNTER — Encounter: Admission: RE | Admit: 2009-10-17 | Discharge: 2009-10-17 | Payer: Self-pay | Admitting: Internal Medicine

## 2009-10-17 IMAGING — US US SOFT TISSUE HEAD/NECK
1 series · 14 of 22 positions shown · non-contrast
Comparison: Ultrasound of the thyroid of [DATE]

CLINICAL DATA: Follow up of thyroid nodules

THYROID ULTRASOUND
TECHNIQUE: Ultrasound examination of the thyroid gland and
adjacent soft tissues was performed.

[Series 1: us soft tissue head/neck · 0.05mm/px · 14 of 22 slices shown]
[im 1/22]
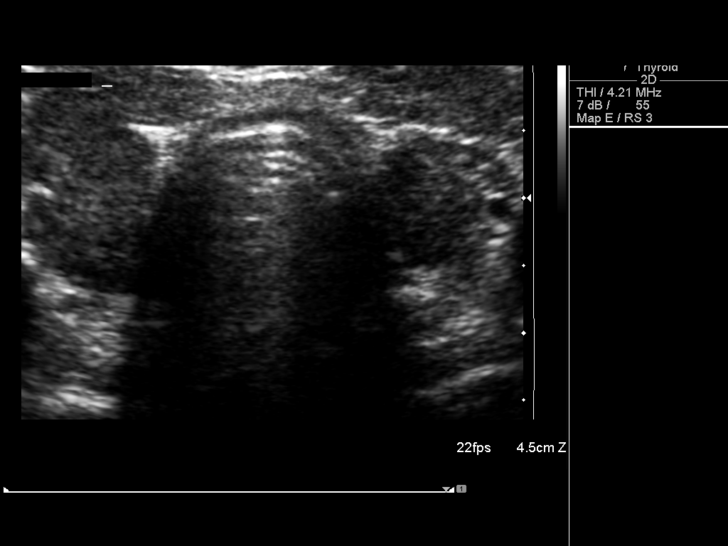
[im 3/22]
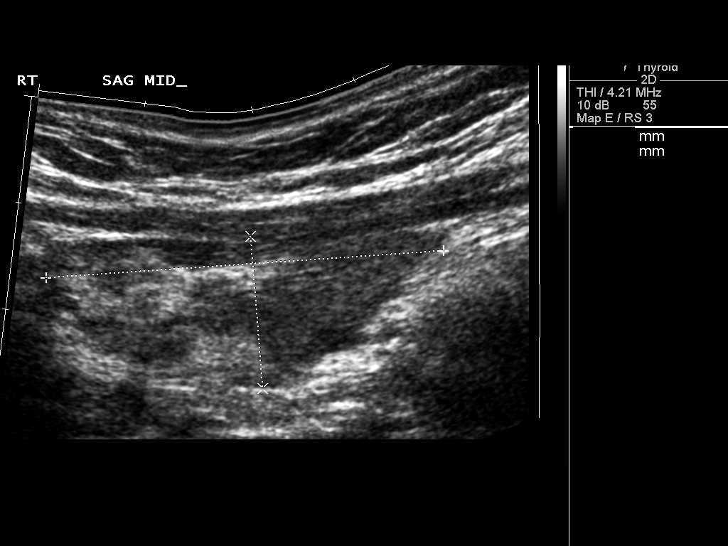
[im 4/22]
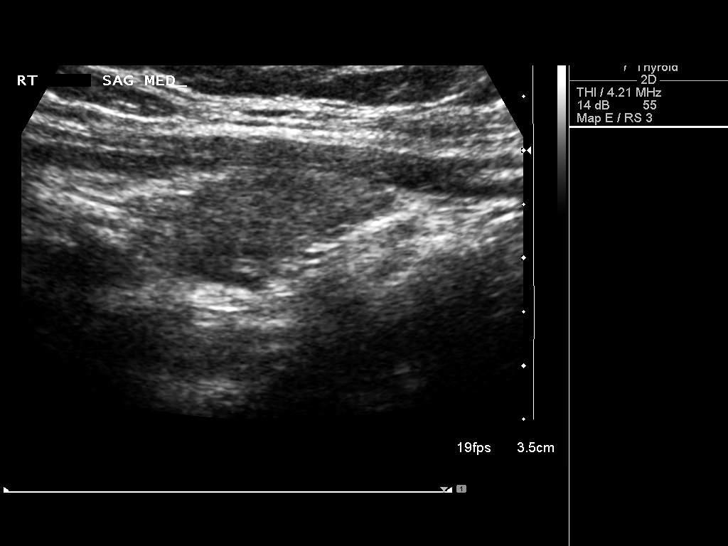
[im 6/22]
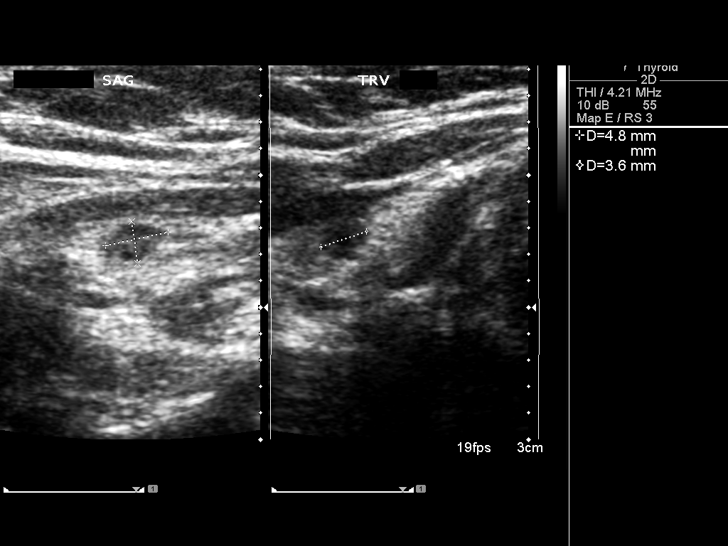
[im 8/22]
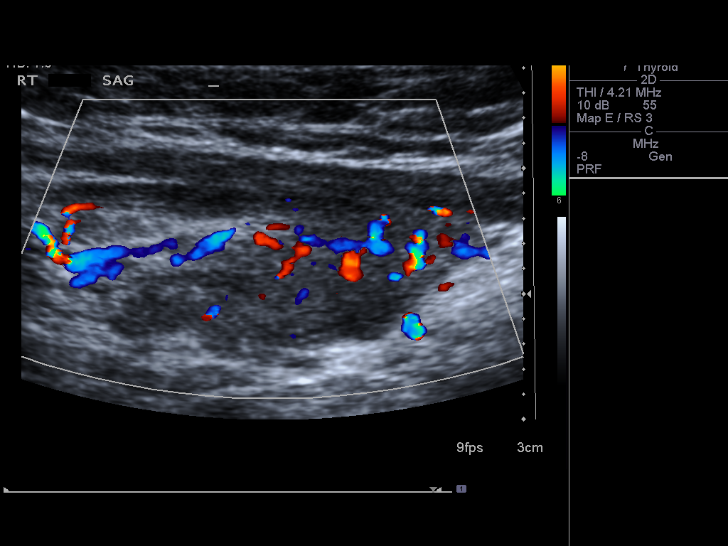
[im 9/22]
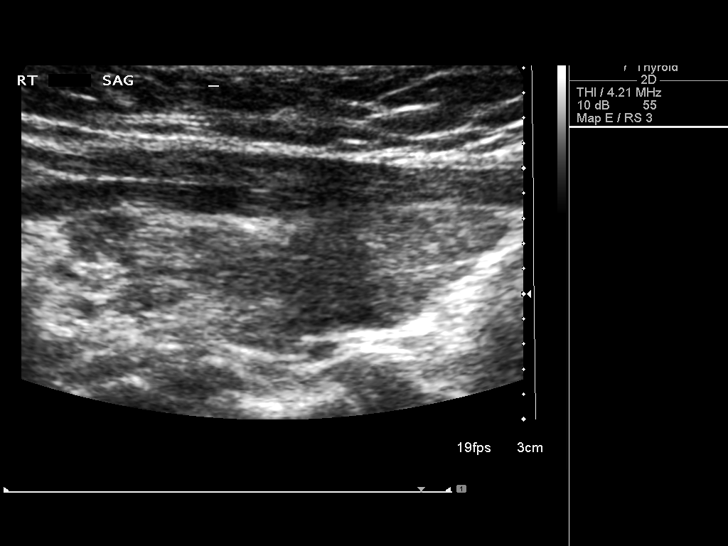
[im 11/22]
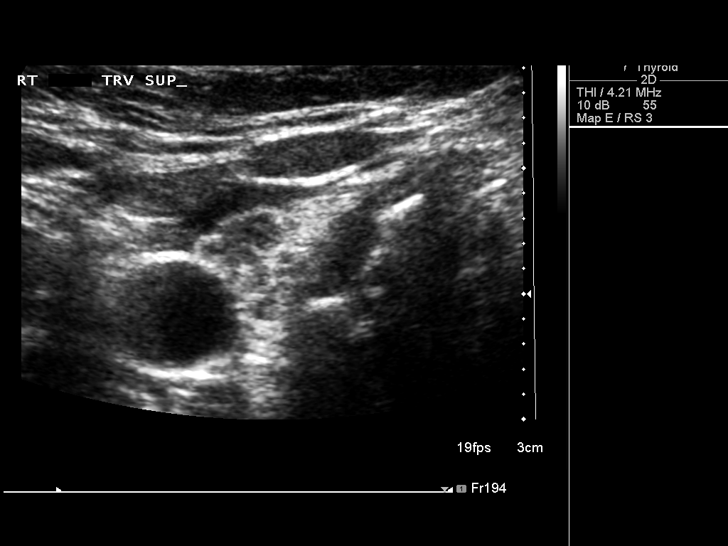
[im 12/22]
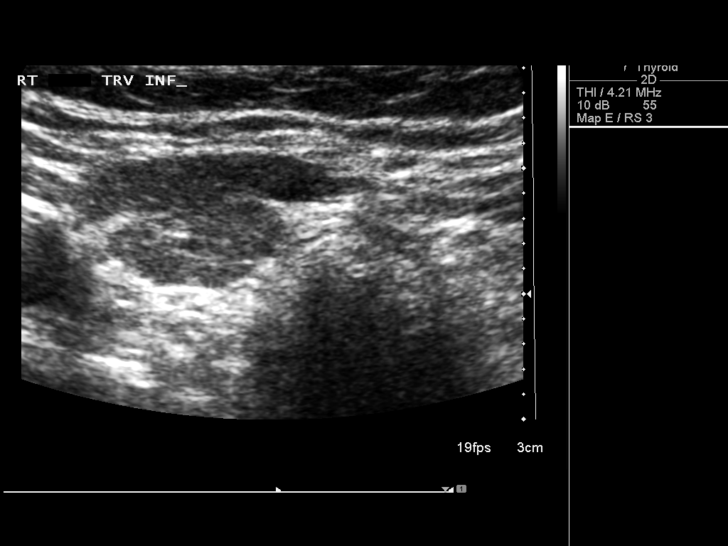
[im 14/22]
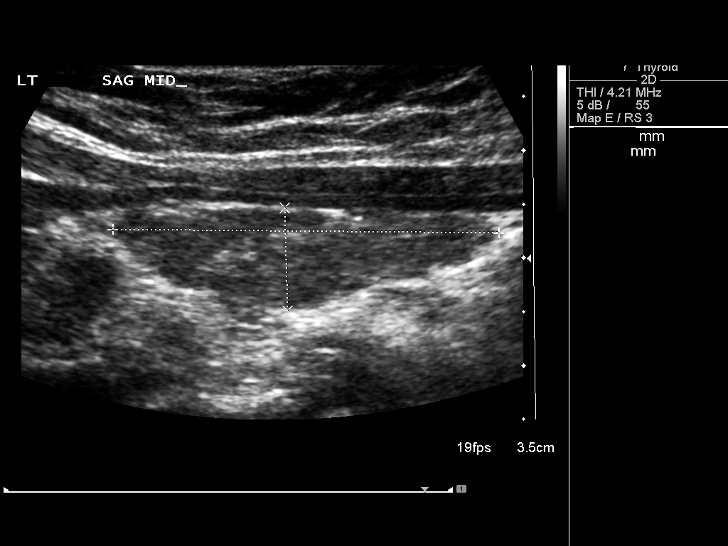
[im 15/22]
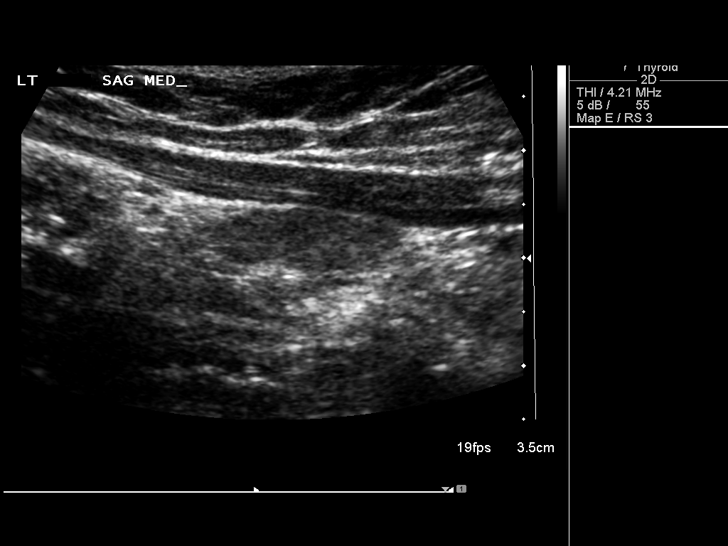
[im 17/22]
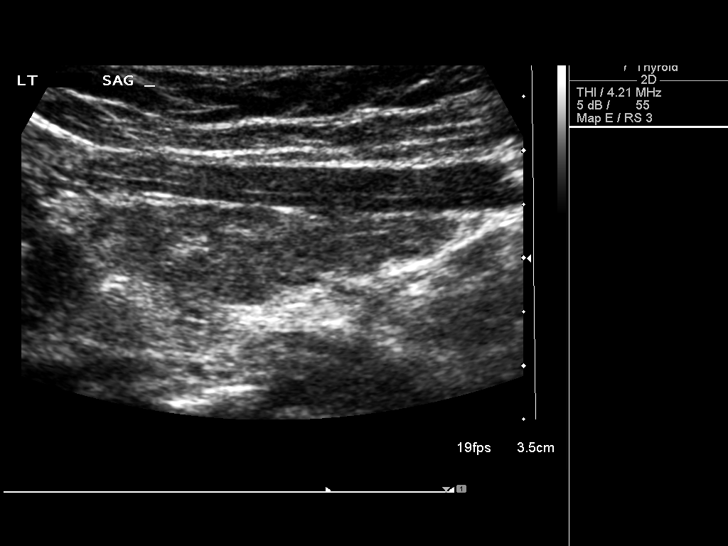
[im 19/22]
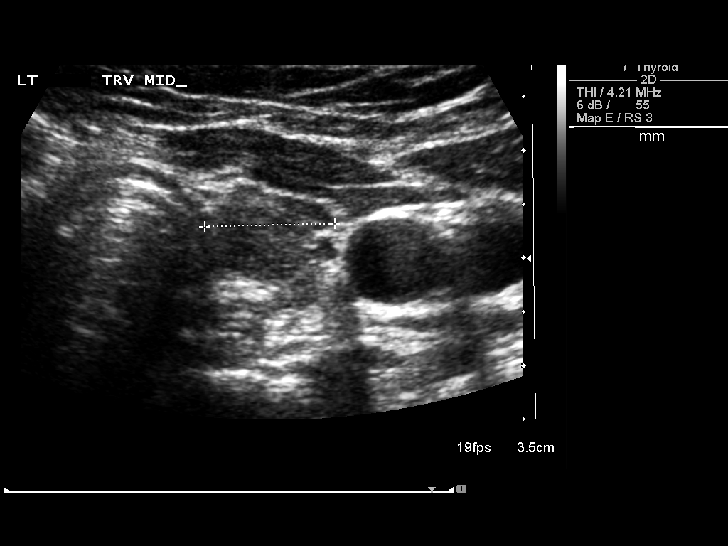
[im 20/22]
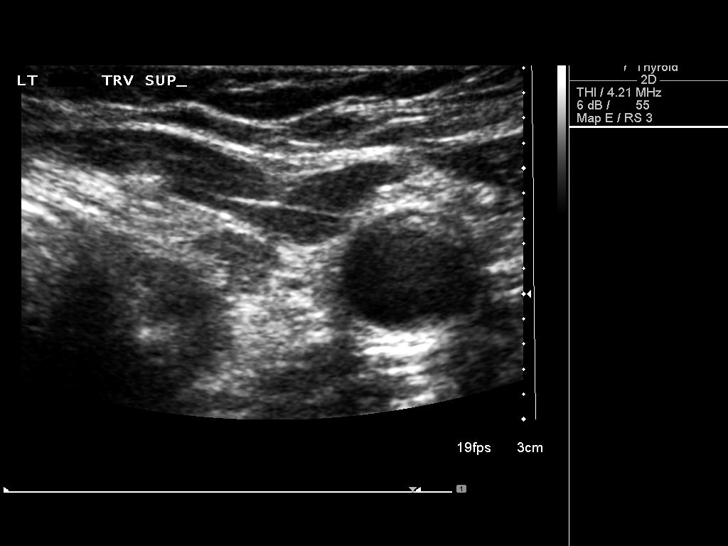
[im 22/22]
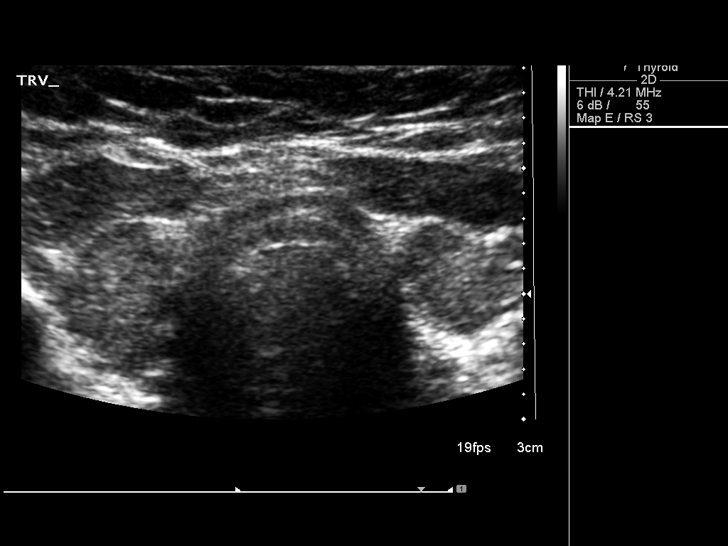

[14 of 22 positions shown; findings below may reference images not displayed]

FINDINGS: The thyroid gland is stable in size.  The right lobe
measures 3.7 cm sagittally, with a depth of 1.4 cm and width of
cm.  (Prior measurements were 3.6 x 1.0 x 1.3 cm).  The left lobe
measures 3.6 x 1.0 x 1.2 cm, with the isthmus measuring 2.1 mm.
(Prior measurements were 3.5 x 0.9 x 1.2 cm, in the isthmus
measuring 1.2 mm).

The gland remains diffusely inhomogeneous.  Only a single solid
nodule is noted in the upper pole of 5 x 3 x 4 mm.  The previously
noted indistinct nodule in the right upper lobe of thyroid is not
currently seen.
IMPRESSION: Stable ultrasound of the thyroid with only a single 5 mm nodule in
the upper pole noted.  The prior nodule in the right upper lobe is
not definitely seen on the current study.

## 2010-11-02 ENCOUNTER — Encounter: Payer: Self-pay | Admitting: Surgery

## 2010-12-27 ENCOUNTER — Other Ambulatory Visit: Payer: Self-pay | Admitting: Internal Medicine

## 2010-12-27 DIAGNOSIS — I6529 Occlusion and stenosis of unspecified carotid artery: Secondary | ICD-10-CM

## 2011-01-01 ENCOUNTER — Ambulatory Visit
Admission: RE | Admit: 2011-01-01 | Discharge: 2011-01-01 | Disposition: A | Discharge status: Home / Self Care | Payer: Medicare Other | Source: Ambulatory Visit | Attending: Internal Medicine | Admitting: Internal Medicine

## 2011-01-01 DIAGNOSIS — I6529 Occlusion and stenosis of unspecified carotid artery: Secondary | ICD-10-CM

## 2011-01-01 IMAGING — US US CAROTID DUPLEX BILAT
1 series · 13 of 24 positions shown · non-contrast
Comparison: None.

CLINICAL DATA: Evaluate for carotid stenosis.

BILATERAL CAROTID DUPLEX ULTRASOUND
TECHNIQUE: Gray scale imaging, color Doppler and duplex ultrasound
was performed of bilateral carotid and vertebral arteries in the
neck.

[Series 1: us carotid duplex bilat · 0.08mm/px · 13 of 60 slices shown]
[im 1/60]
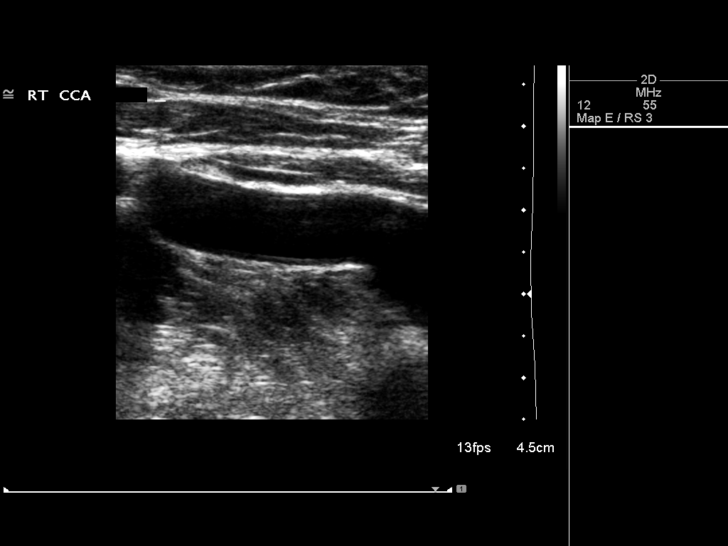
[im 6/60]
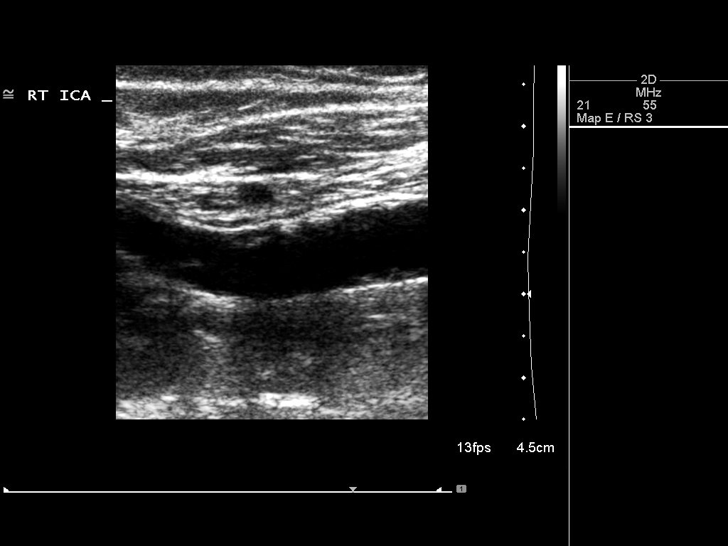
[im 11/60]
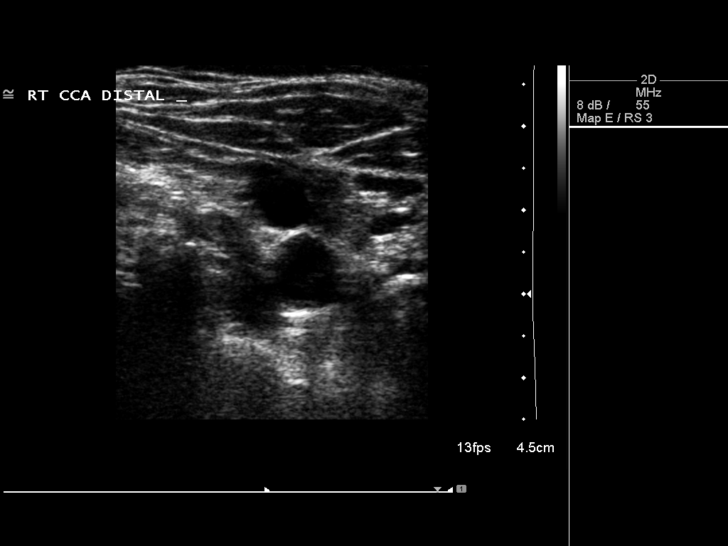
[im 16/60]
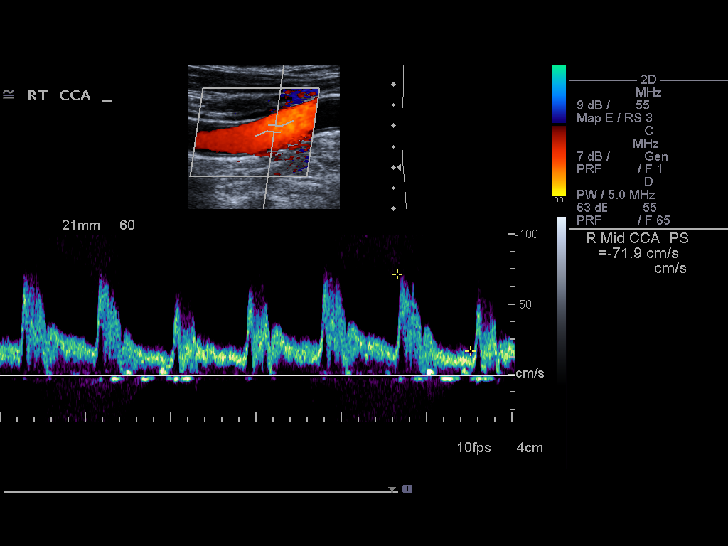
[im 21/60]
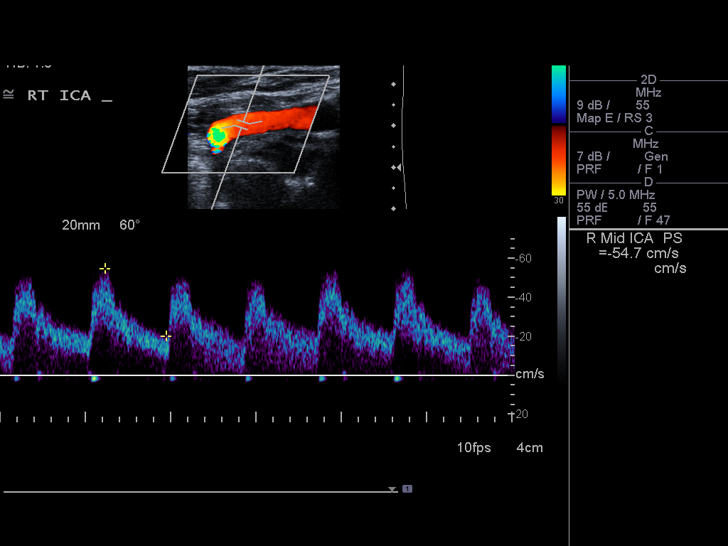
[im 26/60]
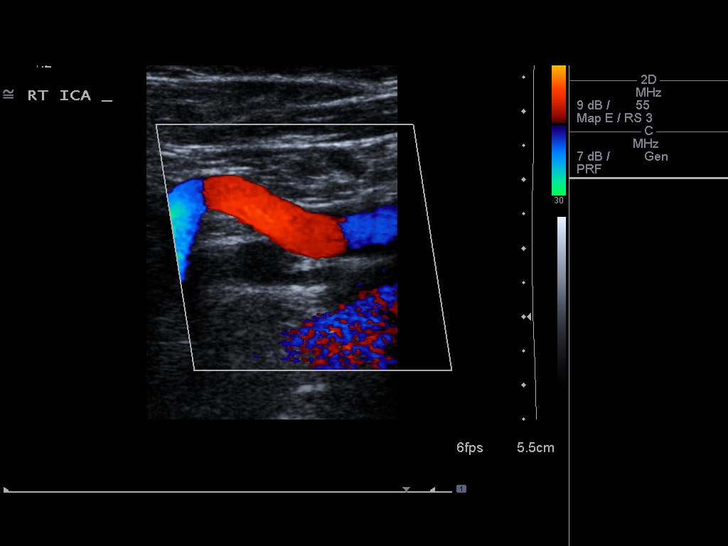
[im 31/60]
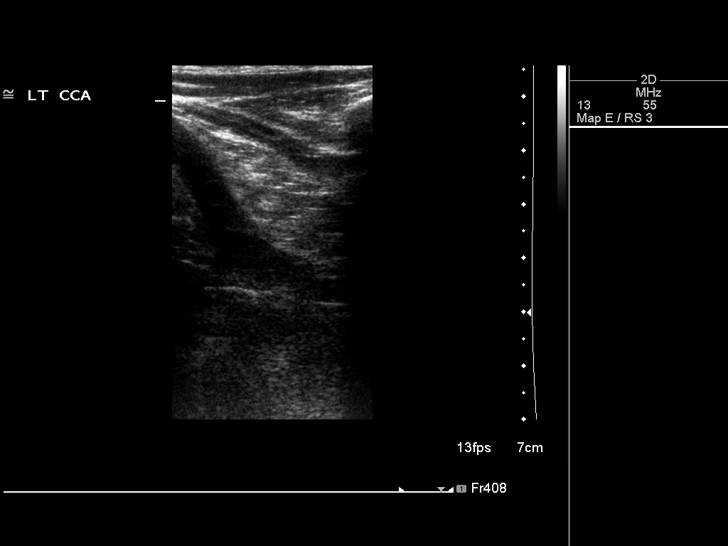
[im 34/60]
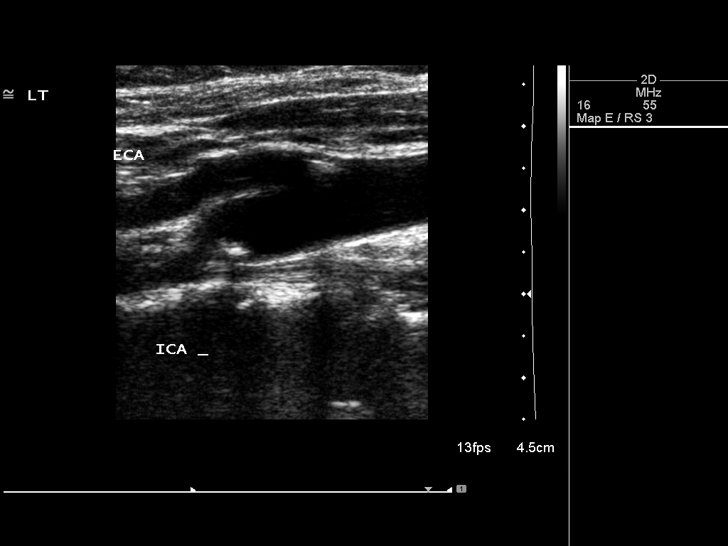
[im 39/60]
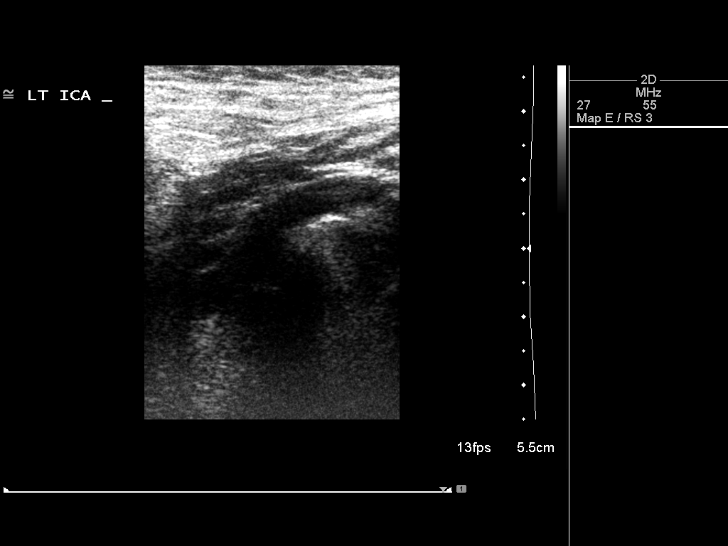
[im 44/60]
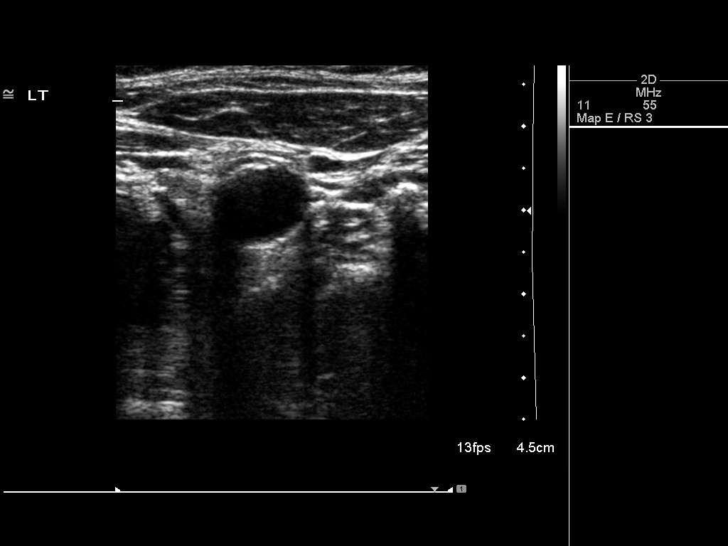
[im 49/60]
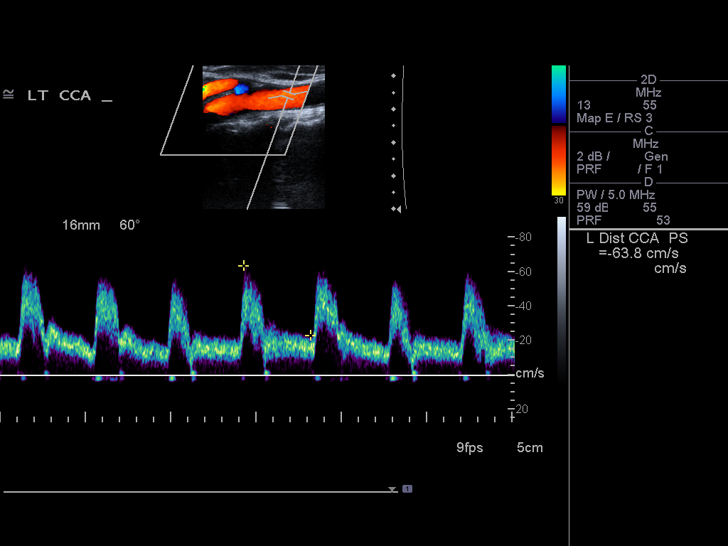
[im 54/60]
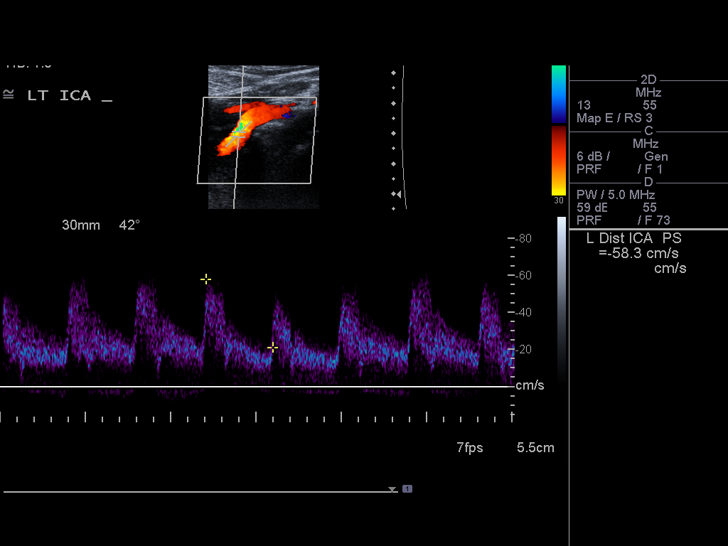
[im 60/60]
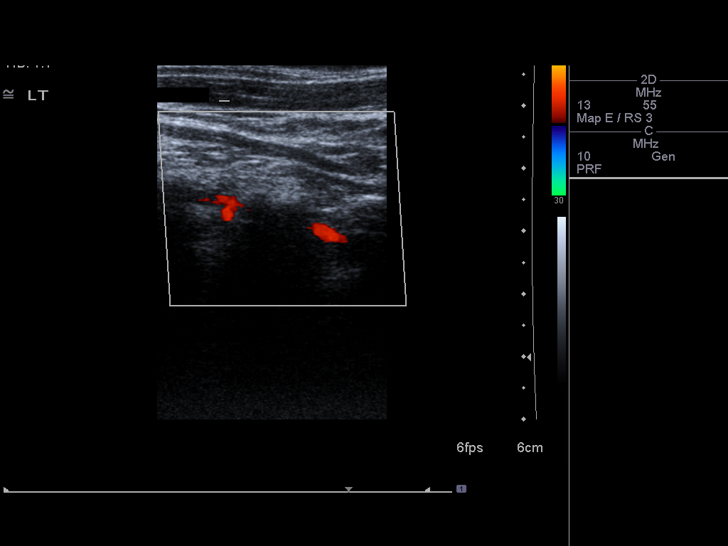

[13 of 24 positions shown; findings below may reference images not displayed]

Criteria:  Quantification of carotid stenosis is based on velocity
parameters that correlate the residual internal carotid diameter
with NASCET-based stenosis levels, using the diameter of the distal
internal carotid lumen as the denominator for stenosis measurement.

The following velocity measurements were obtained:

                 PEAK SYSTOLIC/END DIASTOLIC
RIGHT
ICA:                        72cm/sec
CCA:                        119cm/sec
SYSTOLIC ICA/CCA RATIO:
DIASTOLIC ICA/CCA RATIO:
ECA:                        80cm/sec

LEFT
ICA:                        73cm/sec
CCA:                        75cm/sec
SYSTOLIC ICA/CCA RATIO:
DIASTOLIC ICA/CCA RATIO:
ECA:                        84cm/sec
FINDINGS: RIGHT CAROTID ARTERY: Small amount of plaque and intimal thickening
in the right internal carotid artery.  No significant stenosis.
Antegrade flow in the right carotid arteries.

RIGHT VERTEBRAL ARTERY:  Antegrade flow and normal waveform in the
right vertebral artery.

LEFT CAROTID ARTERY: Small amount of echogenic plaque in the distal
left common carotid artery and proximal left internal carotid
artery. A small eccentric calcified plaque in the left internal
carotid artery without significant stenosis.  Antegrade flow in the
left carotid arteries.

LEFT VERTEBRAL ARTERY:  Antegrade flow and normal waveform in the
left vertebral artery.
IMPRESSION: Atherosclerotic disease involving the carotid arteries (left side
greater than right) without significant stenosis.  Estimated degree
of stenosis in the internal carotid arteries is less than 50%.

## 2011-02-10 ENCOUNTER — Ambulatory Visit (AMBULATORY_SURGERY_CENTER): Payer: Medicare Other | Admitting: *Deleted

## 2011-02-10 VITALS — Ht 64.0 in | Wt 250.1 lb

## 2011-02-10 DIAGNOSIS — Z1211 Encounter for screening for malignant neoplasm of colon: Secondary | ICD-10-CM

## 2011-02-10 DIAGNOSIS — Z86010 Personal history of colonic polyps: Secondary | ICD-10-CM

## 2011-02-10 MED ORDER — PEG-KCL-NACL-NASULF-NA ASC-C 100 G PO SOLR: 1.0000 | Freq: Once | ORAL | Status: AC

## 2011-02-10 NOTE — Patient Instructions (Signed)
Please pick up prep at pharmacy Please review prep instructions

## 2011-02-12 ENCOUNTER — Encounter: Payer: Self-pay | Admitting: Gastroenterology

## 2011-02-21 ENCOUNTER — Encounter: Payer: Self-pay | Admitting: Gastroenterology

## 2011-02-24 ENCOUNTER — Encounter: Payer: Self-pay | Admitting: Gastroenterology

## 2011-02-24 ENCOUNTER — Ambulatory Visit (AMBULATORY_SURGERY_CENTER): Payer: Medicare Other | Admitting: Gastroenterology

## 2011-02-24 DIAGNOSIS — Z1211 Encounter for screening for malignant neoplasm of colon: Secondary | ICD-10-CM

## 2011-02-24 DIAGNOSIS — Z86010 Personal history of colonic polyps: Secondary | ICD-10-CM

## 2011-02-24 DIAGNOSIS — K5730 Diverticulosis of large intestine without perforation or abscess without bleeding: Secondary | ICD-10-CM

## 2011-02-24 DIAGNOSIS — D126 Benign neoplasm of colon, unspecified: Secondary | ICD-10-CM

## 2011-02-24 MED ORDER — SODIUM CHLORIDE 0.9 % IV SOLN: 500.0000 mL | INTRAVENOUS | Status: DC

## 2011-02-24 NOTE — Progress Notes (Signed)
PT  STATES SHE HAS A LOW OXYGEN SATURATION ALL THE TIME, HAS SLEEP APNEA AND USES IT AT TIMES BUT NOT NIGHTLY. PT PINK WITHOUT DISTRESS AND SATS 91-89 %. EWM RN

## 2011-02-24 NOTE — Patient Instructions (Signed)
HANDOUTS GIVEN ON POLYPS, DIVERTICULOSIS, HIGH FIBER DIET  REPEAT COLONOSCOPY IN 5-10 YEARS DEPENDING ON THE PATHOLOGY REPORT- WE WILL MAIL YOU A LETTER IN 1-2 WEEKS WITH THESE RESULTS AND DR PATTERSON'S RECOMMENDATIONS  CAN USE METAMUCIL OR BENEFIBER DAILY OVER THE COUNTER TO INCREASE FIBER IN YOUR DIET AS WELL

## 2011-02-25 ENCOUNTER — Emergency Department (HOSPITAL_COMMUNITY)
Admission: EM | Admit: 2011-02-25 | Discharge: 2011-02-25 | Disposition: A | Discharge status: Home / Self Care | Payer: Medicare Other | Attending: Emergency Medicine | Admitting: Emergency Medicine

## 2011-02-25 ENCOUNTER — Telehealth: Payer: Self-pay | Admitting: *Deleted

## 2011-02-25 ENCOUNTER — Telehealth: Payer: Self-pay | Admitting: Gastroenterology

## 2011-02-25 DIAGNOSIS — Z79899 Other long term (current) drug therapy: Secondary | ICD-10-CM | POA: Insufficient documentation

## 2011-02-25 DIAGNOSIS — R1013 Epigastric pain: Secondary | ICD-10-CM | POA: Insufficient documentation

## 2011-02-25 DIAGNOSIS — I10 Essential (primary) hypertension: Secondary | ICD-10-CM | POA: Insufficient documentation

## 2011-02-25 DIAGNOSIS — R112 Nausea with vomiting, unspecified: Secondary | ICD-10-CM | POA: Insufficient documentation

## 2011-02-25 DIAGNOSIS — Z9884 Bariatric surgery status: Secondary | ICD-10-CM | POA: Insufficient documentation

## 2011-02-25 DIAGNOSIS — E119 Type 2 diabetes mellitus without complications: Secondary | ICD-10-CM | POA: Insufficient documentation

## 2011-02-25 DIAGNOSIS — N390 Urinary tract infection, site not specified: Secondary | ICD-10-CM | POA: Insufficient documentation

## 2011-02-25 DIAGNOSIS — E039 Hypothyroidism, unspecified: Secondary | ICD-10-CM | POA: Insufficient documentation

## 2011-02-25 LAB — DIFFERENTIAL
Basophils Relative: 0 % (ref 0–1)
Eosinophils Absolute: 0 10*3/uL (ref 0.0–0.7)
Eosinophils Relative: 0 % (ref 0–5)
Monocytes Relative: 4 % (ref 3–12)
Neutrophils Relative %: 69 % (ref 43–77)

## 2011-02-25 LAB — COMPREHENSIVE METABOLIC PANEL
ALT: 30 U/L (ref 0–35)
AST: 36 U/L (ref 0–37)
Albumin: 4.1 g/dL (ref 3.5–5.2)
Alkaline Phosphatase: 94 U/L (ref 39–117)
CO2: 28 mEq/L (ref 19–32)
Chloride: 98 mEq/L (ref 96–112)
GFR calc Af Amer: >60 mL/min (ref >60)
GFR calc non Af Amer: >60 mL/min (ref >60)
Potassium: 3.9 mEq/L (ref 3.5–5.1)
Sodium: 137 mEq/L (ref 135–145)
Total Bilirubin: 0.4 mg/dL (ref 0.3–1.2)

## 2011-02-25 LAB — URINALYSIS, ROUTINE W REFLEX MICROSCOPIC
Bilirubin Urine: NEGATIVE
Hgb urine dipstick: NEGATIVE
Nitrite: POSITIVE — AB
Specific Gravity, Urine: 1.02 (ref 1.005–1.030)
pH: 6.5 (ref 5.0–8.0)

## 2011-02-25 LAB — URINE MICROSCOPIC-ADD ON

## 2011-02-25 LAB — CBC
MCH: 28.4 pg (ref 26.0–34.0)
Platelets: 311 10*3/uL (ref 150–400)
RBC: 4.76 MIL/uL (ref 3.87–5.11)
RDW: 13.5 % (ref 11.5–15.5)

## 2011-02-25 NOTE — Telephone Encounter (Signed)
She paged at 1:30 this AM with abd pain that has gradually worsened since colonoscopy yesterday with DRP.  +fevers (subjectively), + nausea and vomiting.  abd feels tender to her.  I told her to go to ER for evaluation, check for complication.

## 2011-02-25 NOTE — Telephone Encounter (Signed)
Message left at home phone #.

## 2011-02-28 ENCOUNTER — Encounter: Payer: Self-pay | Admitting: Gastroenterology

## 2011-02-28 NOTE — Op Note (Signed)
[facility]  Patient:    DEALIE, KOELZER Visit Number: [id number] MRN: [id number]          Service Type: NES Location: [facility] Attending Physician:  Evlyn Clines Dictated by:   Excell Seltzer. Annabell Howells, M.D. Proc. Date: 11/18/01 Admit Date:  11/18/2001   CC:         Soyla Murphy. Renne Crigler, M.D.   Operative Report  PROCEDURE:  1. Right ureteroscopy.  2. Cystoscopy.  3. Right retrograde pyelogram with interpretation.  PREOPERATIVE DIAGNOSIS:  Right ureteral stone ______.  POSTOPERATIVE DIAGNOSIS:  Right ureteral stone ______ with interval passage of stone.  SURGEON:  Excell Seltzer. Annabell Howells, M.D.  ANESTHESIA:  General.  COMPLICATIONS:  None.  INDICATIONS FOR PROCEDURE:  Ms. Coote is a 65 year old white female who initially presented to the emergency room with severe right flank pain. She was found to have urinary infection as well as CT scan that demonstrated a small right distal ureteral stone with hydro. She had a KUB in our office the first of the week. The stone was not well visualized but she was obese and was still having symptoms at that point. We elected to schedule her for a ureteroscopy if her stone did not pass. She did not notice it passing and today presents for ureteroscopy.  FINDINGS AND PROCEDURE:  The patient was taken to the operating room where a general anesthetic was induced. She had received p.o. tequin preoperatively and was on antibiotics perioperatively. She was placed in lithotomy position, her perineum and genitalia were prepped with Betadine solution and she was draped in the usual sterile fashion. A 6 French short ureteroscope was passed per urethra under visual guidance. The right ureteral orifice was identified and was cannulated with the ureteroscope. There were some inflammatory changes of the meatus and just proximal to the meatus was a bit of scar and proximal to that there was some dilation; however, I was unable to  identify a stone. I was able to advance the scope to the level of the vessels and no stone was seen. At this point, the ureteroscope was removed, the cystoscope was inserted and the bladder was examined with the 12 and 70 degree lenses. Examination revealed a normal urethra, the bladder wall had mild trabeculation. There were some inflammatory changes around the right ureteral orifice but no other significant abnormalities were noted. The left ureteral orifice was unremarkable. She then underwent right retrograde pyelogram to confirm no proximal stones. There was narrowing of the distal ureter with mild dilation proximal to the proximal ureteral level, no filling defects suggestive of a stone were seen. Upon removal of the open end catheter, contrast drained readily into the bladder. It is my impression that she had a stone but passed it prior to the procedure. At this point, the bladder was drained, the patient was taken down from lithotomy position, her anesthetic was reversed and she was moved to the recovery room in stable condition. There were no complications. Dictated by:   Excell Seltzer. Annabell Howells, M.D. Attending Physician:  Evlyn Clines DD:  11/18/01 TD:  11/19/01 Job: [id number]

## 2011-02-28 NOTE — Op Note (Signed)
Destiny [name], Destiny [name]              ACCOUNT NO.:  [id number]   MEDICAL RECORD NO.:  [id number]          PATIENT TYPE:  OIB   LOCATION:  1516                         FACILITY:  [facility]   PHYSICIAN:  Vikki Ports, MDDATE OF BIRTH:  02-07-1946   DATE OF PROCEDURE:  09/16/2005  DATE OF DISCHARGE:                                 OPERATIVE REPORT   PREOPERATIVE DIAGNOSIS:  Morbid obesity.   POSTOPERATIVE DIAGNOSIS:  Morbid obesity.   PROCEDURE:  Laparoscopic adjustable gastric banding with truncal vagotomy.   SURGEON:  Dr. Danna Hefty   ASSISTANT:  Dr. Thornton Park. Martin   ANESTHESIA:  General.   DESCRIPTION OF PROCEDURE:  The patient was taken to the operating room,  placed in supine position.  After adequate general anesthesia was induced  using endotracheal tube, the abdomen was prepped and draped in the normal  sterile fashion.  Using an 11 mm incision in the left upper quadrant, I used  direct vision in an OptiVu technique to obtain peritoneal access.  Pneumoperitoneum was obtained.  Under direct vision, 12 mm and 11 mm trocars  were placed in the right upper quadrant.  Additional 11 mm trocar was placed  in the mid abdomen, and 5 mm trocar was placed in the left abdomen.  Nathanson liver retractor was placed in the abdomen, and the left lateral  segment liver was retracted anteriorly.  The angle of His was sharply and  bluntly dissected.  The lesser omentum was incised.  The right crus was  identified, and the retroesophageal space was identified.  Using blunt  dissection, the posterior vagus nerve was identified, clipped, and a 1 cm  segment was resected.  I then incised the peritoneum on the anterior aspect  of the esophagus and identified the anterior vagus nerve.  This was clipped  and resected.  The stomach had previously been irrigated with simethicone  and water.  Upper endoscopy was then performed, and [place] red was instilled  into the stomach.  There was  no change, and the esophageal mucosa remained a  red color.  This verified the presence of a complete truncal vagotomy.  The  endoscope was then removed.   I then resected the gastric fat pad which was overlying the distal esophagus  and stomach.  This was accomplished with Bovie electrocautery.  The lap band  passer was then passed in the retroesophageal space without any trauma and  brought out at the angle of His.  A 10 cm Innomed adjustable gastric band  was then placed through the 12 mm trocar site, passed in the retroesophageal  space by the gastric band passer and brought out.  This was closed along the  sizing balloon which was passed transorally.  It was snapped in place.  This  was fixed in place with interrupted 2-0 Ethibond sutures from the stomach to  the esophagus.  It moved easily and was in nice position.  The tubing was  brought out and connected to the port.  Nathanson liver retractor was  removed.  Pneumoperitoneum was released.  Trocars were removed.  The port was  fixed to the anterior abdominal fascia  with interrupted 2-0 Prolene sutures.  Subcutaneous tissue was closed with  interrupted 2-0 Vicryl sutures.  Skin incisions were closed with staples.  The patient tolerated the procedure well and went to PACU in good condition.      Vikki Ports, MD  Electronically Signed     KRH/MEDQ  D:  09/16/2005  T:  09/16/2005  Job:  [id number]   cc:   Soyla Murphy. Renne Crigler, M.D.  Fax: [phone]

## 2012-02-18 DIAGNOSIS — H353190 Nonexudative age-related macular degeneration, unspecified eye, stage unspecified: Secondary | ICD-10-CM | POA: Insufficient documentation

## 2012-07-23 ENCOUNTER — Other Ambulatory Visit: Payer: Self-pay | Admitting: Otolaryngology

## 2012-07-23 DIAGNOSIS — E0590 Thyrotoxicosis, unspecified without thyrotoxic crisis or storm: Secondary | ICD-10-CM

## 2012-07-27 ENCOUNTER — Ambulatory Visit
Admission: RE | Admit: 2012-07-27 | Discharge: 2012-07-27 | Disposition: A | Discharge status: Home / Self Care | Payer: Medicare Other | Source: Ambulatory Visit | Attending: Otolaryngology | Admitting: Otolaryngology

## 2012-07-27 DIAGNOSIS — E0590 Thyrotoxicosis, unspecified without thyrotoxic crisis or storm: Secondary | ICD-10-CM

## 2012-07-27 IMAGING — CT CT NECK W/ CM
4 of 5 series · 16 of 33 positions shown, 19 images · IV contrast (75CC OMNI 300)
Comparison: None.

CLINICAL DATA: Thyrotoxicosis.  Right neck mass

BUN and creatinine were obtained on site at [HOSPITAL] at
[HOSPITAL]..
Results:  BUN 14 mg/dL,  Creatinine 0.9 mg/dL.
CT NECK WITH CONTRAST
TECHNIQUE: Multidetector CT imaging of the neck was performed with
intravenous contrast.
Contrast: 75mL OMNIPAQUE IOHEXOL 300 MG/ML  SOLN

[Series 2: axial neck · axial · 0.43mm/px · z∈[-202,-139]mm · 2 of 102 slices shown]
[im 26/102  bone]
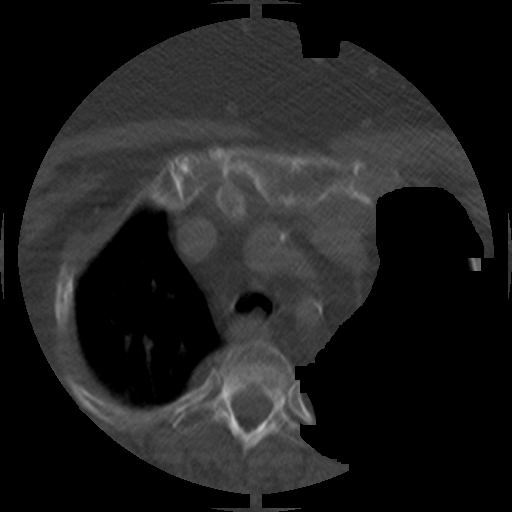
[im 51/102  bone]
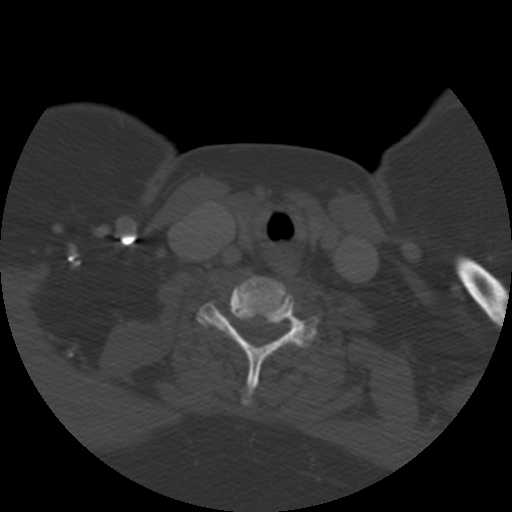

[Series 400: cor · coronal · 0.51mm/px · 3 of 123 slices shown]
[im 35/123  bone]
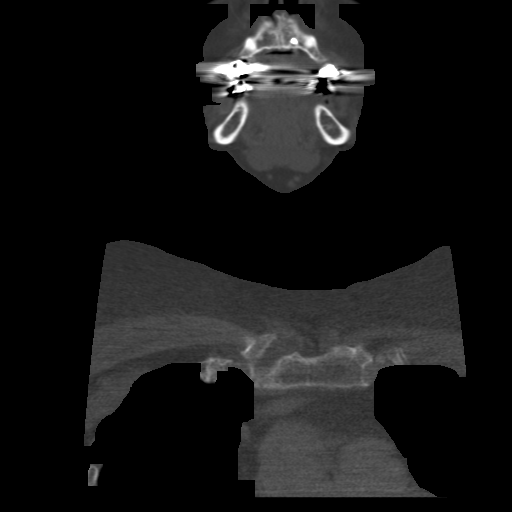
[im 53/123  bone]
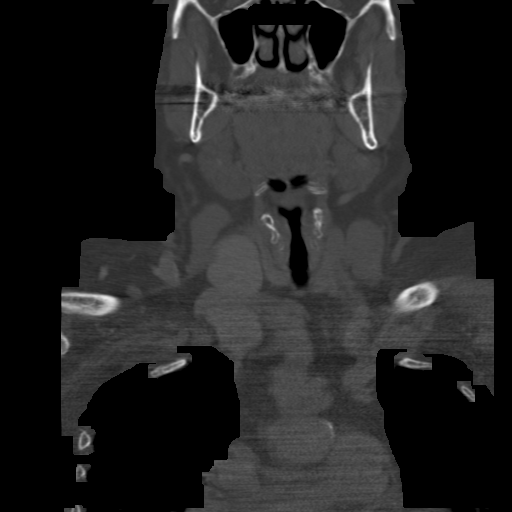
[im 71/123  bone]
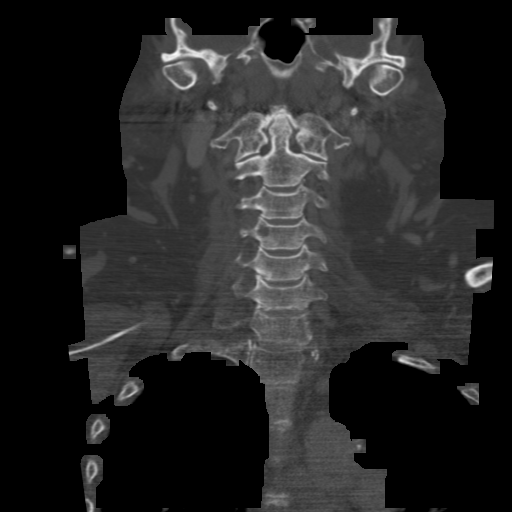

[Series 401: sag · sagittal · 0.51mm/px · 5 of 111 slices shown, 6 images]
[im 37/111  bone]
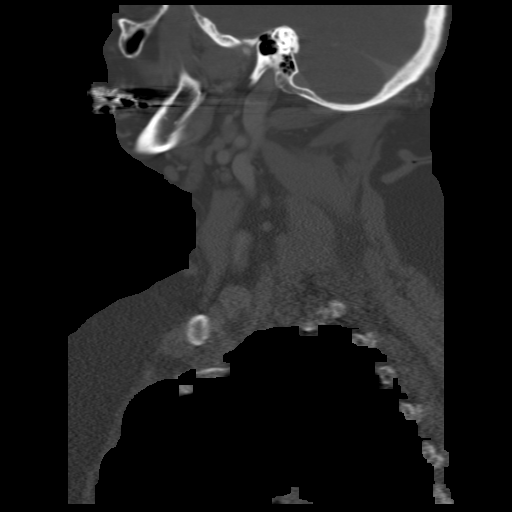
[im 46/111  bone]
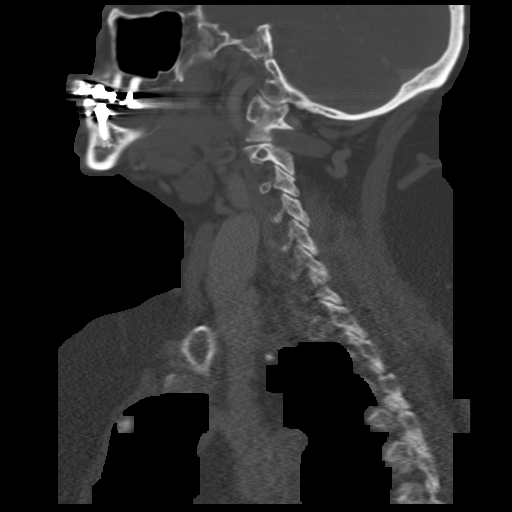
[im 56/111  soft-tissue]
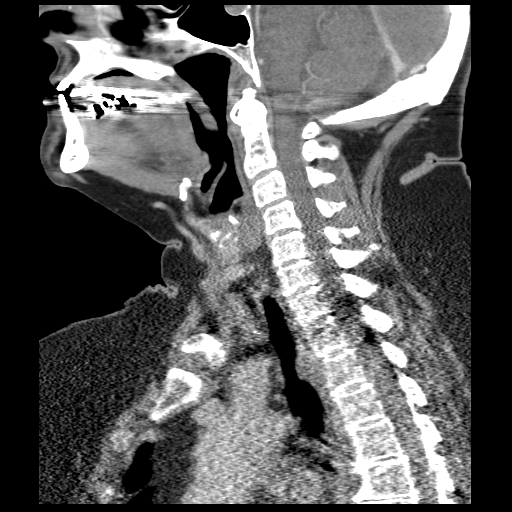
[im 56/111  bone]
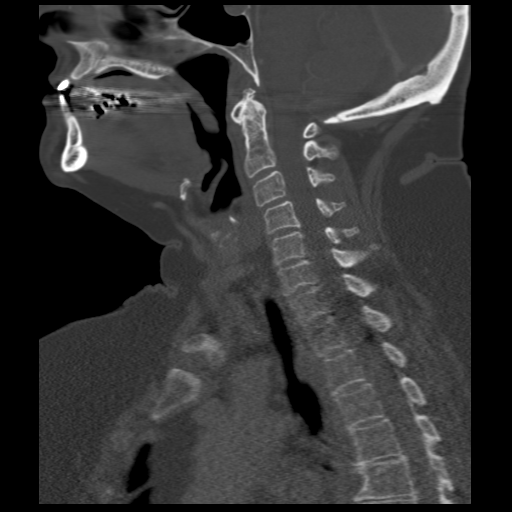
[im 65/111  bone]
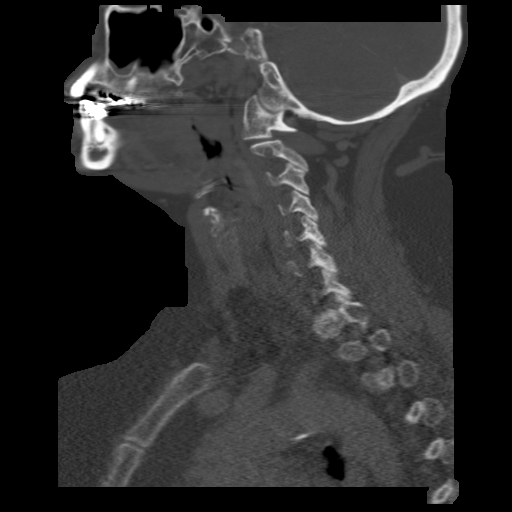
[im 74/111  bone]
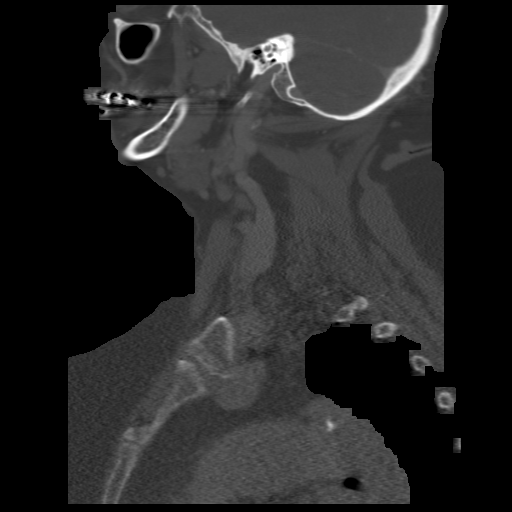

[Series 402: axial · axial · 0.43mm/px · z∈[-267,-65]mm · 6 of 146 slices shown, 8 images]
[im 21/146  soft-tissue]
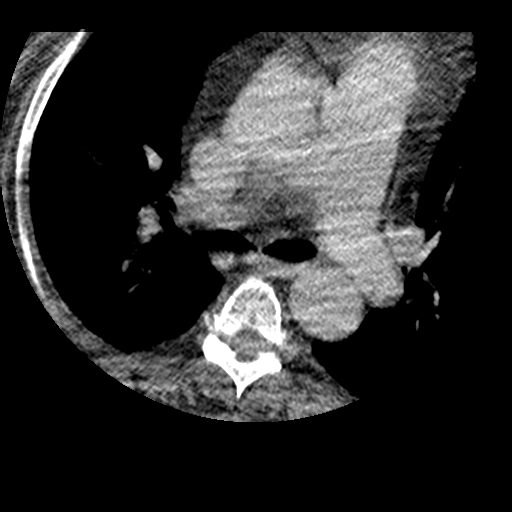
[im 21/146  bone]
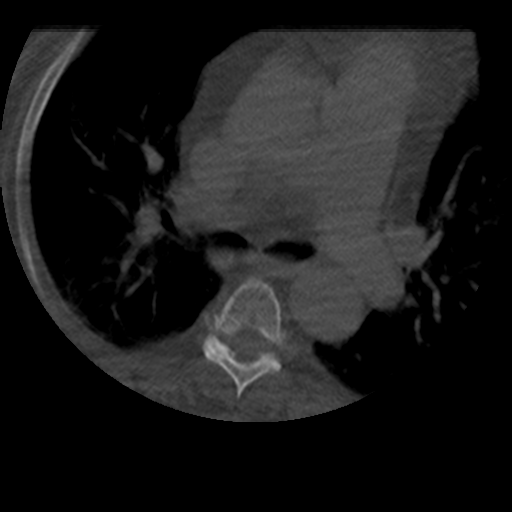
[im 42/146  bone]
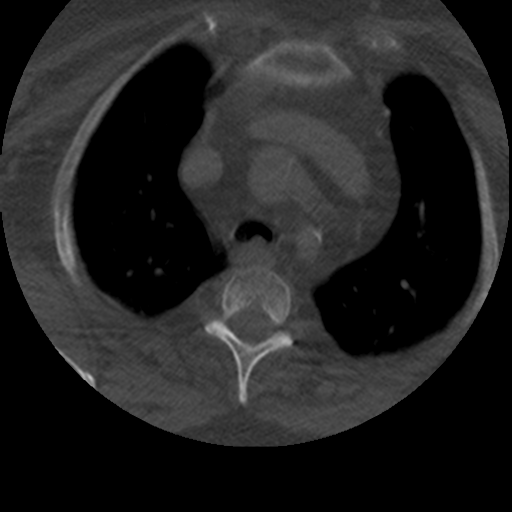
[im 63/146  bone]
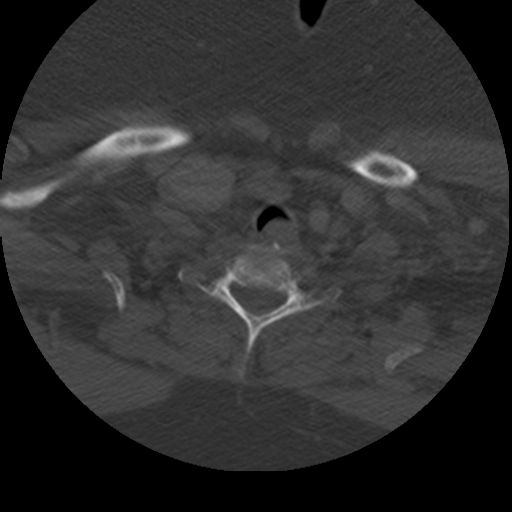
[im 83/146  bone]
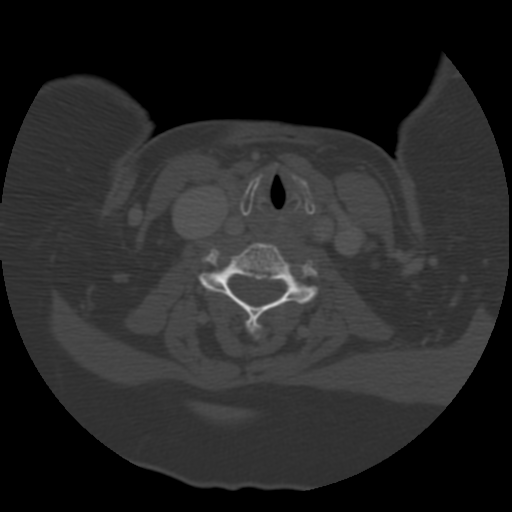
[im 104/146  soft-tissue]
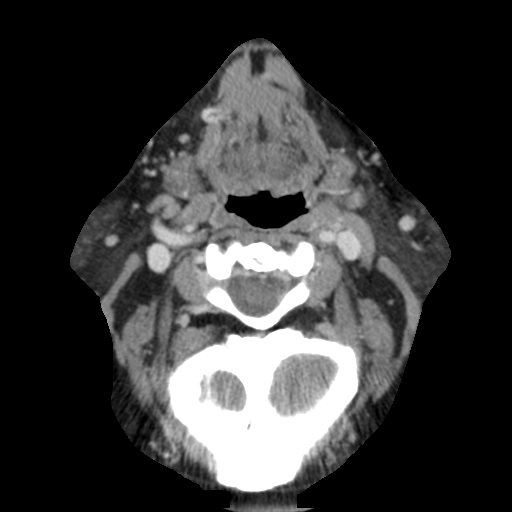
[im 104/146  bone]
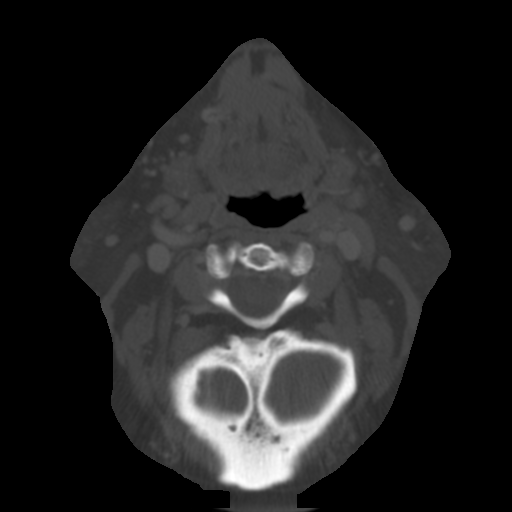
[im 125/146  bone]
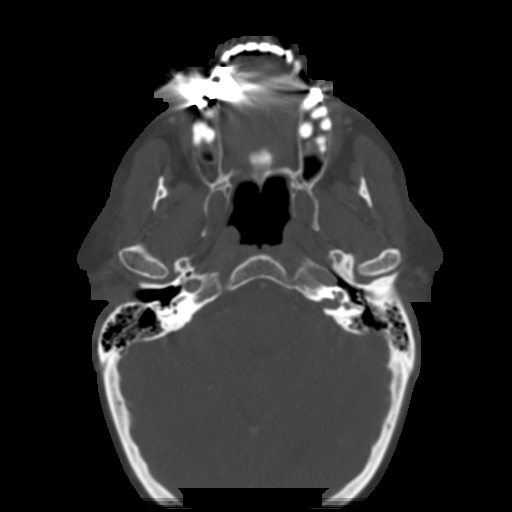

[16 of 33 positions shown; findings below may reference images not displayed]

FINDINGS: Vitamin E capsule marks a palpable abnormality in the
right mid neck at the level of the hyoid.  No mass lesion is seen
in this area.  There is no pathologic adenopathy in the neck.

Paranasal sinuses are clear.  Skull base   shows no focal bony
lesion.  The tongue is normal.  Tonsils and para pharyngeal soft
tissues are symmetric.  Epiglottis and larynx are normal.  Parotid
and submandibular glands are normal bilaterally.  Thyroid is
relatively small without focal lesion.  Lung apices are clear.
Mild cervical disc degeneration and spondylosis without focal bony
abnormality.
IMPRESSION: Negative for mass or adenopathy.  No abnormality is seen
corresponding to the palpable area on the right.

## 2012-07-27 MED ORDER — IOHEXOL 300 MG/ML  SOLN
75.0000 mL | Freq: Once | INTRAMUSCULAR | Status: AC | PRN
  Administered 2012-07-27: 75 mL via INTRAVENOUS

## 2013-11-09 ENCOUNTER — Encounter: Payer: Self-pay | Admitting: Gastroenterology

## 2013-12-16 ENCOUNTER — Encounter: Payer: Self-pay | Admitting: Gastroenterology

## 2013-12-16 ENCOUNTER — Ambulatory Visit (INDEPENDENT_AMBULATORY_CARE_PROVIDER_SITE_OTHER): Payer: Medicare Other | Admitting: Gastroenterology

## 2013-12-16 VITALS — BP 118/72 | HR 88 | Ht 64.0 in

## 2013-12-16 DIAGNOSIS — K621 Rectal polyp: Principal | ICD-10-CM

## 2013-12-16 DIAGNOSIS — K620 Anal polyp: Secondary | ICD-10-CM | POA: Insufficient documentation

## 2013-12-16 NOTE — Progress Notes (Signed)
_                                                                                                                History of Present Illness: Destiny [name] 68 year old white female referred for evaluation of possible rectal polyp.  This was noted on routine physical exam.Dr. Shelia Media was unsure whether he palpated a small polyp or an internal hemorrhoids.  The patient has no GI complaints including abdominal or rectal pain, change of bowel habits, rectal bleeding, melena or hematochezia.  Colonoscopy in 2012 demonstrated diverticulosis and a small hyperplastic polyp.    Past Medical History  Diagnosis Date  . Allergy   . Arthritis   . Blood transfusion     in 1980  . Diabetes mellitus   . Hypertension     takes Benicar as a "preventative"  . Osteoporosis     osteopenia  . Thyroid disease     hypothyroidism   Past Surgical History  Procedure Laterality Date  . Total abdominal hysterectomy w/ bilateral salpingoophorectomy    . Colonoscopy    . Polypectomy    . Wisdom tooth extraction    . Laparoscopic gastric banding     family history includes Lung cancer in her sister; Ovarian cancer in her sister; Throat cancer in her brother. Current Outpatient Prescriptions  Medication Sig Dispense Refill  . Ergocalciferol (VITAMIN D2 PO) Take 1 tablet by mouth once a week.        . ezetimibe (ZETIA) 10 MG tablet Take 10 mg by mouth daily.        Marland Kitchen levothyroxine (SYNTHROID, LEVOTHROID) 137 MCG tablet Take 137 mcg by mouth daily.        . metFORMIN (GLUCOPHAGE) 500 MG tablet Take 500 mg by mouth 2 (two) times daily at 8 am and 10 pm.       . Multiple Vitamin (MULTIVITAMIN PO) Take 1 tablet by mouth daily.        . Multiple Vitamins-Minerals (OCUVITE PO) Take 1 tablet by mouth daily.        . NON FORMULARY Inject 2 mLs as directed every 7 (seven) days. buydureon shot every week      . Olmesartan Medoxomil (BENICAR PO) Take 10 mg by mouth daily.        Marland Kitchen Specialty Vitamins Products  (VITAMINS FOR HAIR PO) Take 1 tablet by mouth daily.         No current facility-administered medications for this visit.   Allergies as of 12/16/2013  . (No Known Allergies)    reports that she has quit smoking. She does not have any smokeless tobacco history on file. She reports that she does not drink alcohol or use illicit drugs.     Review of Systems: Pertinent positive and negative review of systems were noted in the above HPI section. All other review of systems were otherwise negative.  Vital signs were reviewed in today's medical record Physical Exam: General: Well developed , well nourished, no acute distress Skin: anicteric Head:  Normocephalic and atraumatic Eyes:  sclerae anicteric, EOMI Ears: Normal auditory acuity Mouth: No deformity or lesions Neck: Supple, no masses or thyromegaly Lungs: Clear throughout to auscultation Heart: Regular rate and rhythm; no rubs or bruits.  There is a 1/6 early systolic murmur at the left sternal border Abdomen: Soft, non tender and non distended. No masses, hepatosplenomegaly or hernias noted. Normal Bowel sounds Rectal: There is a nontender nodule just inside the rectal vault in the right anterior position.  Stool is Hemoccult negative Musculoskeletal: Symmetrical with no gross deformities  Skin: No lesions on visible extremities Pulses:  Normal pulses noted Extremities: No clubbing, cyanosis, edema or deformities noted Neurological: Alert oriented x 4, grossly nonfocal Cervical Nodes:  No significant cervical adenopathy Inguinal Nodes: No significant inguinal adenopathy Psychological:  Alert and cooperative. Normal mood and affect  See Assessment and Plan under Problem List

## 2013-12-16 NOTE — Patient Instructions (Signed)
You have been scheduled for an un sedated flex sigmoidoscopy on Monday Separate instructions given

## 2013-12-16 NOTE — Assessment & Plan Note (Signed)
The small nodular area that is palpated may be do to an adenomas polyp versus a prominent rectal papilla or hemorrhoid  Recommendations #1 sigmoidoscopy

## 2013-12-19 ENCOUNTER — Encounter: Payer: Self-pay | Admitting: Gastroenterology

## 2013-12-19 ENCOUNTER — Ambulatory Visit (AMBULATORY_SURGERY_CENTER): Payer: Medicare Other | Admitting: Gastroenterology

## 2013-12-19 VITALS — BP 117/78 | HR 67 | Temp 98.1°F | Resp 16 | Ht 64.0 in | Wt 230.0 lb

## 2013-12-19 DIAGNOSIS — K620 Anal polyp: Secondary | ICD-10-CM

## 2013-12-19 DIAGNOSIS — K648 Other hemorrhoids: Secondary | ICD-10-CM

## 2013-12-19 DIAGNOSIS — K621 Rectal polyp: Principal | ICD-10-CM

## 2013-12-19 LAB — GLUCOSE, CAPILLARY
Glucose-Capillary: 106 mg/dL — ABNORMAL HIGH (ref 70–99)
Glucose-Capillary: 101 mg/dL — ABNORMAL HIGH (ref 70–99)

## 2013-12-19 MED ORDER — SODIUM CHLORIDE 0.9 % IV SOLN: 500.0000 mL | INTRAVENOUS | Status: DC

## 2013-12-19 NOTE — Progress Notes (Signed)
Transfer to recovery. Report given

## 2013-12-19 NOTE — Patient Instructions (Addendum)
YOU HAD AN ENDOSCOPIC PROCEDURE TODAY AT [facility]: Refer to the procedure report that was given to you for any specific questions about what was found during the examination.  If the procedure report does not answer your questions, please call your gastroenterologist to clarify.  If you requested that your care partner not be given the details of your procedure findings, then the procedure report has been included in a sealed envelope for you to review at your convenience later.  YOU SHOULD EXPECT: Some feelings of bloating in the abdomen. Passage of more gas than usual.  Walking can help get rid of the air that was put into your GI tract during the procedure and reduce the bloating. If you had a lower endoscopy (such as a colonoscopy or flexible sigmoidoscopy) you may notice spotting of blood in your stool or on the toilet paper. If you underwent a bowel prep for your procedure, then you may not have a normal bowel movement for a few days.  DIET: Your first meal following the procedure should be a light meal and then it is ok to progress to your normal diet.  A half-sandwich or bowl of soup is an example of a good first meal.  Heavy or fried foods are harder to digest and may make you feel nauseous or bloated.  Likewise meals heavy in dairy and vegetables can cause extra gas to form and this can also increase the bloating.  Drink plenty of fluids but you should avoid alcoholic beverages for 24 hours.  ACTIVITY: You had no sedation for your flex sigmoidoscoy today.  No restrictions on activity today.   SYMPTOMS TO REPORT IMMEDIATELY: A gastroenterologist can be reached at any hour.  During normal business hours, 8:30 AM to 5:00 PM Monday through Friday, call [phone].  After hours and on weekends, please call the GI answering service at [phone] who will take a message and have the physician on call contact you.   Following lower endoscopy (colonoscopy or flexible  sigmoidoscopy):  Excessive amounts of blood in the stool  Significant tenderness or worsening of abdominal pains  Swelling of the abdomen that is new, acute  Fever of 100F or higher    FOLLOW UP: If any biopsies were taken you will be contacted by phone or by letter within the next 1-3 weeks.  Call your gastroenterologist if you have not heard about the biopsies in 3 weeks.  Our staff will call the home number listed on your records the next business day following your procedure to check on you and address any questions or concerns that you may have at that time regarding the information given to you following your procedure. This is a courtesy call and so if there is no answer at the home number and we have not heard from you through the emergency physician on call, we will assume that you have returned to your regular daily activities without incident.  SIGNATURES/CONFIDENTIALITY: You and/or your care partner have signed paperwork which will be entered into your electronic medical record.  These signatures attest to the fact that that the information above on your After Visit Summary has been reviewed and is understood.  Full responsibility of the confidentiality of this discharge information lies with you and/or your care-partner.   A handout was given to the pt on hemorrhoids and a high fiber diet. You may resume you current medications today. Please call if any questions or concerns. Blood sugar was 101 in  the recovery room.

## 2013-12-19 NOTE — Op Note (Signed)
[facility]  [address] [place], [address]   FLEXIBLE SIGMOIDOSCOPY PROCEDURE REPORT  PATIENT: Destiny [name], Destiny [name]  MR#: [id number] BIRTHDATE: 1945-12-24 , 67  yrs. old GENDER: Female ENDOSCOPIST: Inda Castle, MD REFERRED BY: PROCEDURE DATE:  12/19/2013 PROCEDURE:   Sigmoidoscopy, diagnostic ASA CLASS:   Class II INDICATIONS: possible rectal polyp on digital exam MEDICATIONS:  DESCRIPTION OF PROCEDURE:   After the risks benefits and alternatives of the procedure were thoroughly explained, informed consent was obtained.  A small nontender nodule was palpated just inside the anal canal      The LB [id number]  endoscope was introduced through the anus  and advanced to the sigmoid colon , limited by No adverse events experienced.   The quality of the prep was    .  The instrument was then slowly withdrawn as the mucosa was fully examined.         COLON FINDINGS: Internal hemorrhoids were found. Retroflexed views revealed no abnormalities.    The scope was then withdrawn from the patient and the procedure terminated.  COMPLICATIONS: There were no complications.  ENDOSCOPIC IMPRESSION: Internal hemorrhoids  RECOMMENDATIONS: no further GI workup or therapy  REPEAT EXAM:   _______________________________ eSignedInda Castle, MD 12/19/2013 8:38 AM   CC:

## 2013-12-19 NOTE — Progress Notes (Signed)
Pt did not have IV for the flex sig.  maw

## 2013-12-19 NOTE — Progress Notes (Signed)
Per Dr Kelby Fam order, pt is not to have an IV for procedure.

## 2013-12-19 NOTE — Progress Notes (Signed)
No sewdation for flex sig.  Pt able to walk out on her feet.  No complaints noted. maw

## 2013-12-20 ENCOUNTER — Telehealth: Payer: Self-pay | Admitting: *Deleted

## 2013-12-20 NOTE — Telephone Encounter (Signed)
No answer, message left for the patient. 

## 2015-12-06 DIAGNOSIS — E785 Hyperlipidemia, unspecified: Secondary | ICD-10-CM | POA: Diagnosis not present

## 2015-12-06 DIAGNOSIS — I10 Essential (primary) hypertension: Secondary | ICD-10-CM | POA: Diagnosis not present

## 2015-12-06 DIAGNOSIS — N390 Urinary tract infection, site not specified: Secondary | ICD-10-CM | POA: Diagnosis not present

## 2015-12-06 DIAGNOSIS — E11610 Type 2 diabetes mellitus with diabetic neuropathic arthropathy: Secondary | ICD-10-CM | POA: Diagnosis not present

## 2015-12-06 DIAGNOSIS — E7800 Pure hypercholesterolemia, unspecified: Secondary | ICD-10-CM | POA: Diagnosis not present

## 2015-12-06 DIAGNOSIS — Z0000 Encounter for general adult medical examination without abnormal findings: Secondary | ICD-10-CM | POA: Diagnosis not present

## 2015-12-06 DIAGNOSIS — E039 Hypothyroidism, unspecified: Secondary | ICD-10-CM | POA: Diagnosis not present

## 2015-12-06 DIAGNOSIS — E1140 Type 2 diabetes mellitus with diabetic neuropathy, unspecified: Secondary | ICD-10-CM | POA: Diagnosis not present

## 2015-12-11 DIAGNOSIS — Z0000 Encounter for general adult medical examination without abnormal findings: Secondary | ICD-10-CM | POA: Diagnosis not present

## 2015-12-11 DIAGNOSIS — I10 Essential (primary) hypertension: Secondary | ICD-10-CM | POA: Diagnosis not present

## 2015-12-11 DIAGNOSIS — I6529 Occlusion and stenosis of unspecified carotid artery: Secondary | ICD-10-CM | POA: Diagnosis not present

## 2015-12-11 DIAGNOSIS — E7800 Pure hypercholesterolemia, unspecified: Secondary | ICD-10-CM | POA: Diagnosis not present

## 2016-01-01 DIAGNOSIS — E1140 Type 2 diabetes mellitus with diabetic neuropathy, unspecified: Secondary | ICD-10-CM | POA: Diagnosis not present

## 2016-02-12 DIAGNOSIS — E11610 Type 2 diabetes mellitus with diabetic neuropathic arthropathy: Secondary | ICD-10-CM | POA: Diagnosis not present

## 2016-04-08 DIAGNOSIS — E11610 Type 2 diabetes mellitus with diabetic neuropathic arthropathy: Secondary | ICD-10-CM | POA: Diagnosis not present

## 2016-04-10 DIAGNOSIS — E785 Hyperlipidemia, unspecified: Secondary | ICD-10-CM | POA: Diagnosis not present

## 2016-04-10 DIAGNOSIS — I10 Essential (primary) hypertension: Secondary | ICD-10-CM | POA: Diagnosis not present

## 2016-04-10 DIAGNOSIS — E11610 Type 2 diabetes mellitus with diabetic neuropathic arthropathy: Secondary | ICD-10-CM | POA: Diagnosis not present

## 2016-04-10 DIAGNOSIS — E7800 Pure hypercholesterolemia, unspecified: Secondary | ICD-10-CM | POA: Diagnosis not present

## 2016-07-01 DIAGNOSIS — E11610 Type 2 diabetes mellitus with diabetic neuropathic arthropathy: Secondary | ICD-10-CM | POA: Diagnosis not present

## 2016-07-05 ENCOUNTER — Encounter (HOSPITAL_COMMUNITY): Payer: Self-pay

## 2016-07-05 ENCOUNTER — Emergency Department (HOSPITAL_COMMUNITY)
Admission: EM | Admit: 2016-07-05 | Discharge: 2016-07-05 | Disposition: A | Discharge status: Home / Self Care | Payer: PPO | Attending: Emergency Medicine | Admitting: Emergency Medicine

## 2016-07-05 ENCOUNTER — Emergency Department (HOSPITAL_COMMUNITY): Payer: PPO

## 2016-07-05 DIAGNOSIS — Z87891 Personal history of nicotine dependence: Secondary | ICD-10-CM | POA: Insufficient documentation

## 2016-07-05 DIAGNOSIS — Y939 Activity, unspecified: Secondary | ICD-10-CM | POA: Diagnosis not present

## 2016-07-05 DIAGNOSIS — Y929 Unspecified place or not applicable: Secondary | ICD-10-CM | POA: Insufficient documentation

## 2016-07-05 DIAGNOSIS — S52571A Other intraarticular fracture of lower end of right radius, initial encounter for closed fracture: Secondary | ICD-10-CM | POA: Insufficient documentation

## 2016-07-05 DIAGNOSIS — Y999 Unspecified external cause status: Secondary | ICD-10-CM | POA: Diagnosis not present

## 2016-07-05 DIAGNOSIS — W010XXA Fall on same level from slipping, tripping and stumbling without subsequent striking against object, initial encounter: Secondary | ICD-10-CM | POA: Diagnosis not present

## 2016-07-05 DIAGNOSIS — E119 Type 2 diabetes mellitus without complications: Secondary | ICD-10-CM | POA: Diagnosis not present

## 2016-07-05 DIAGNOSIS — E039 Hypothyroidism, unspecified: Secondary | ICD-10-CM | POA: Diagnosis not present

## 2016-07-05 DIAGNOSIS — S6991XA Unspecified injury of right wrist, hand and finger(s), initial encounter: Secondary | ICD-10-CM | POA: Diagnosis not present

## 2016-07-05 DIAGNOSIS — S62101A Fracture of unspecified carpal bone, right wrist, initial encounter for closed fracture: Secondary | ICD-10-CM

## 2016-07-05 DIAGNOSIS — I10 Essential (primary) hypertension: Secondary | ICD-10-CM | POA: Diagnosis not present

## 2016-07-05 DIAGNOSIS — Z7984 Long term (current) use of oral hypoglycemic drugs: Secondary | ICD-10-CM | POA: Insufficient documentation

## 2016-07-05 DIAGNOSIS — S52501A Unspecified fracture of the lower end of right radius, initial encounter for closed fracture: Secondary | ICD-10-CM | POA: Diagnosis not present

## 2016-07-05 IMAGING — CR DG WRIST COMPLETE 3+V*R*
4 series · 4 of 4 positions shown · non-contrast
Comparison: None.

CLINICAL DATA: Right wrist pain after fall.

EXAM:
RIGHT WRIST - COMPLETE 3+ VIEW

[wrist pa]
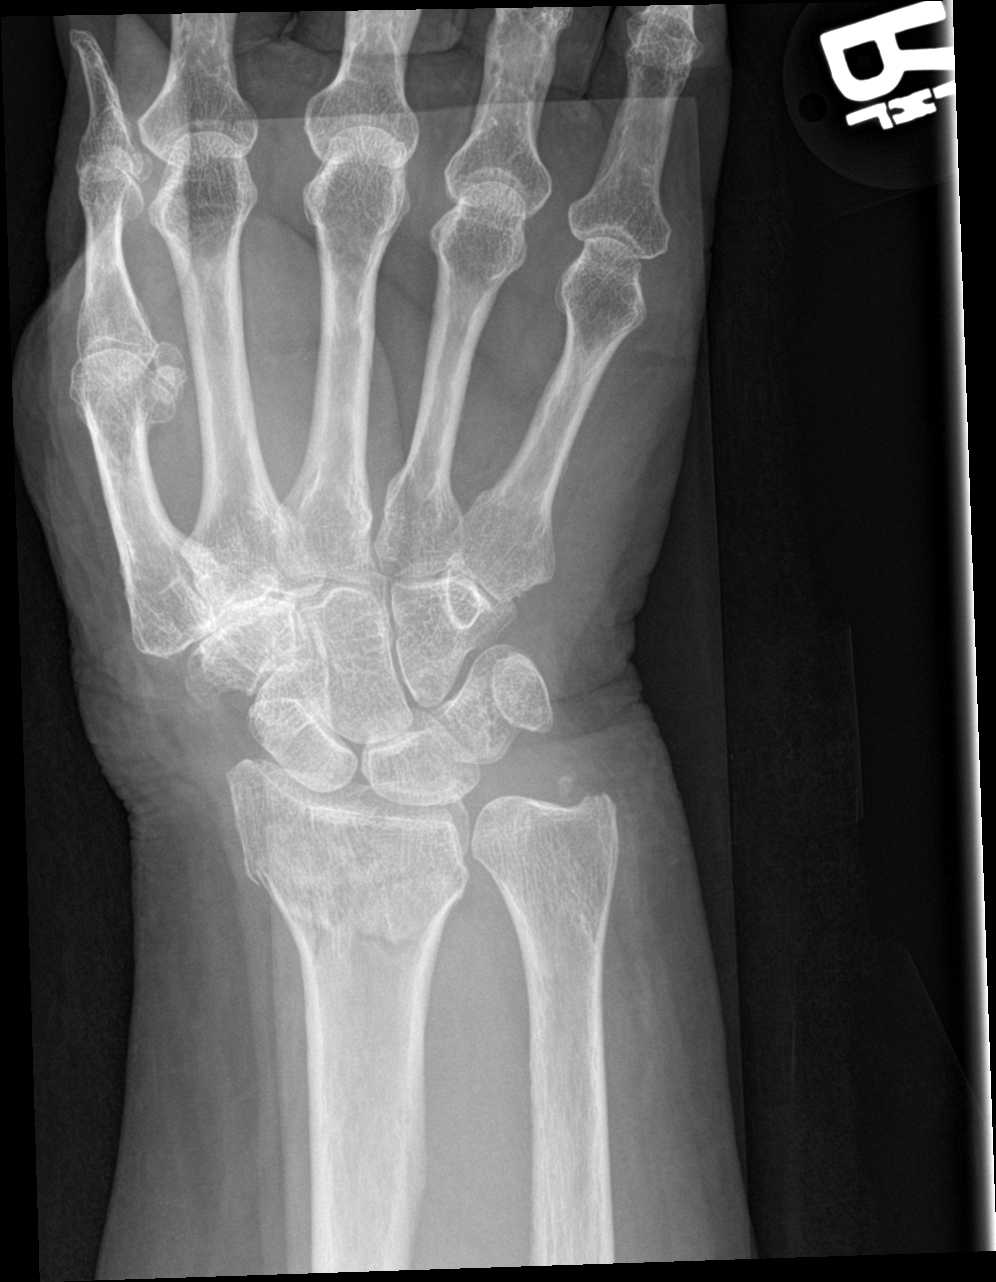

[wrist obl]
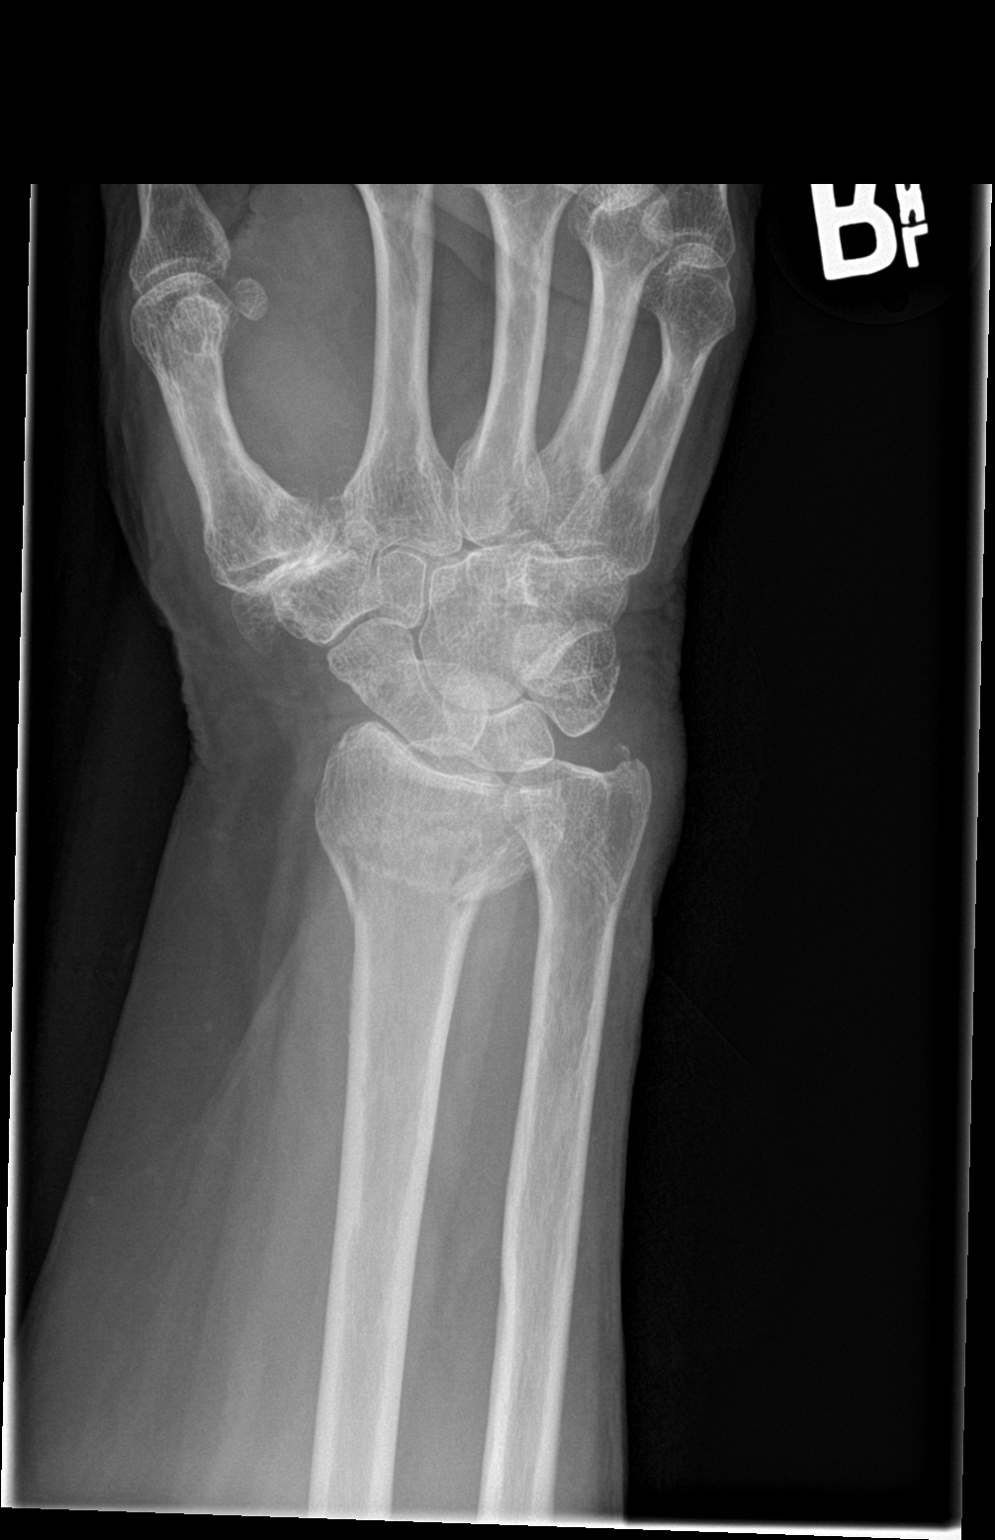

[wrist lat]
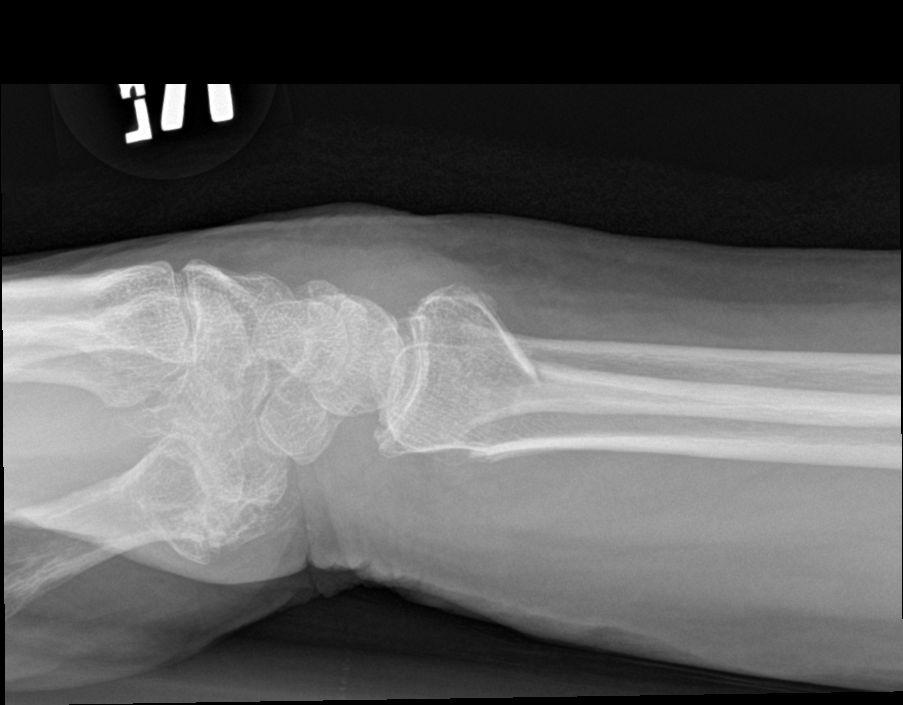

[wrist navicular]
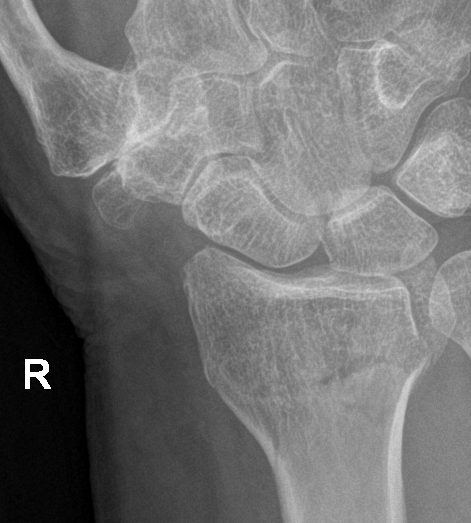

[4 of 4 positions shown; findings below may reference images not displayed]

FINDINGS: There is an acute, closed, dorsally angulated, nondisplaced,
comminuted intra-articular fracture of the distal right radial
metaphysis with extension into the distal radioulnar joint. There is
a fracture of the ulnar styloid with radial displacement of the
fracture fragment. Osteoarthritic joint space narrowing is seen at
the base of the thumb metacarpal and triscaphe articulations. Soft
tissue swelling is noted about the fracture.
IMPRESSION: Acute, closed, dorsally angulated, nondisplaced, comminuted
intra-articular fracture of the distal right radial metaphysis with
extension into the distal radioulnar joint.

Acute displaced ulnar styloid fracture.

Osteoarthritis of the first [facility] and triscaphe articulations.

## 2016-07-05 MED ORDER — ACETAMINOPHEN 500 MG PO TABS
1000.0000 mg | ORAL_TABLET | Freq: Once | ORAL | Status: AC
  Administered 2016-07-05: 1000 mg via ORAL
  Filled 2016-07-05: qty 2

## 2016-07-05 NOTE — ED Notes (Signed)
Pt A&OX4, ambulatory at d/c with steady gait, NAD 

## 2016-07-05 NOTE — Progress Notes (Signed)
Orthopedic Tech Progress Note Patient Details:  Destiny [name] August 22, 1946 [id number]  Ortho Devices Type of Ortho Device: Ace wrap, Thumb spica splint Ortho Device/Splint Interventions: Application   [name] 07/05/2016, 2:01 PM

## 2016-07-05 NOTE — ED Triage Notes (Signed)
Patient here with right wrist pain after slipping and falling this am, arrived with splint to wrist. Obvious deformity, positive distal pulse

## 2016-07-05 NOTE — ED Provider Notes (Signed)
[facility] DEPT Provider Note   CSN: [id number] Arrival date & time: 07/05/16  1009     History   Chief Complaint Chief Complaint  Patient presents with  . Fall  . Wrist Injury    HPI Destiny [name] is a 70 y.o. female with no significant past medical history who presents emergency Department with chief complaint of fall and wrist pain. Patient states that this morning she was picking peas in the field when she slipped and fell onto an outstretched right hand. She is right-hand dominant. She had immediate severe pain, deformity and swelling of the right wrist. She states that she felt like she was going to throw up. She called her friend to bring her board and the splinted the wrist. She came to the emergency department directly by personal vehicle. She denies any numbness or tingling. She has pain with movement of the fingers. She has never hurt this wrist before. In consciousness. She is not on any blood thinners  HPI  Past Medical History:  Diagnosis Date  . Allergy   . Arthritis   . Blood transfusion    in 1980  . Diabetes mellitus   . Hypertension    takes Benicar as a "preventative"  . Osteoporosis    osteopenia  . Thyroid disease    hypothyroidism    Patient Active Problem List   Diagnosis Date Noted  . Anal and rectal polyp 12/16/2013    Past Surgical History:  Procedure Laterality Date  . COLONOSCOPY    . LAPAROSCOPIC GASTRIC BANDING    . POLYPECTOMY    . TOTAL ABDOMINAL HYSTERECTOMY W/ BILATERAL SALPINGOOPHORECTOMY    . WISDOM TOOTH EXTRACTION      OB History    No data available       Home Medications    Prior to Admission medications   Medication Sig Start Date End Date Taking? Authorizing Provider  Ergocalciferol (VITAMIN D2 PO) Take 1 tablet by mouth once a week.      Historical Provider, MD  ezetimibe (ZETIA) 10 MG tablet Take 10 mg by mouth daily.      Historical Provider, MD  levothyroxine (SYNTHROID, LEVOTHROID) 137 MCG tablet Take  137 mcg by mouth daily.      Historical Provider, MD  metFORMIN (GLUCOPHAGE) 500 MG tablet Take 500 mg by mouth 2 (two) times daily at 8 am and 10 pm.     Historical Provider, MD  Multiple Vitamin (MULTIVITAMIN PO) Take 1 tablet by mouth daily.      Historical Provider, MD  Multiple Vitamins-Minerals (OCUVITE PO) Take 1 tablet by mouth daily.      Historical Provider, MD  NON FORMULARY Inject 2 mLs as directed every 7 (seven) days. buydureon shot every week    Historical Provider, MD  Olmesartan Medoxomil (BENICAR PO) Take 10 mg by mouth daily.      Historical Provider, MD  Specialty Vitamins Products (VITAMINS FOR HAIR PO) Take 1 tablet by mouth daily.      Historical Provider, MD    Family History Family History  Problem Relation Age of Onset  . Lung cancer Sister   . Ovarian cancer Sister   . Throat cancer Brother     Social History Social History  Substance Use Topics  . Smoking status: Former Research scientist (life sciences)  . Smokeless tobacco: Never Used  . Alcohol use No     Allergies   Review of patient's allergies indicates no known allergies.   Review of Systems Review of  Systems  Ten systems reviewed and are negative for acute change, except as noted in the HPI.   Physical Exam Updated Vital Signs BP 141/74   Pulse 64   Temp 98.1 F (36.7 C) (Oral)   Resp 16   SpO2 94%   Physical Exam  Constitutional: She is oriented to person, place, and time. She appears well-developed and well-nourished. No distress.  HENT:  Head: Normocephalic and atraumatic.  Eyes: Conjunctivae are normal. No scleral icterus.  Neck: Normal range of motion.  Cardiovascular: Normal rate, regular rhythm and normal heart sounds.  Exam reveals no gallop and no friction rub.   No murmur heard. Pulmonary/Chest: Effort normal and breath sounds normal. No respiratory distress.  Abdominal: Soft. Bowel sounds are normal. She exhibits no distension and no mass. There is no tenderness. There is no guarding.    Musculoskeletal:  Wrist exam: soft tissue tenderness and swelling at the wrist, deformity of wrist, sensation normal, Radial pulse is present with but thready.      Neurological: She is alert and oriented to person, place, and time.  Skin: Skin is warm and dry. She is not diaphoretic.     ED Treatments / Results  Labs (all labs ordered are listed, but only abnormal results are displayed) Labs Reviewed - No data to display  EKG  EKG Interpretation None       Radiology Dg Wrist Complete Right  Result Date: 07/05/2016 CLINICAL DATA:  Right wrist pain after fall. EXAM: RIGHT WRIST - COMPLETE 3+ VIEW COMPARISON:  None. FINDINGS: There is an acute, closed, dorsally angulated, nondisplaced, comminuted intra-articular fracture of the distal right radial metaphysis with extension into the distal radioulnar joint. There is a fracture of the ulnar styloid with radial displacement of the fracture fragment. Osteoarthritic joint space narrowing is seen at the base of the thumb metacarpal and triscaphe articulations. Soft tissue swelling is noted about the fracture. IMPRESSION: Acute, closed, dorsally angulated, nondisplaced, comminuted intra-articular fracture of the distal right radial metaphysis with extension into the distal radioulnar joint. Acute displaced ulnar styloid fracture. 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 Electronically Signed   By: Ashley Royalty M.D.   On: 07/05/2016 11:48    Procedures Procedures (including critical care time)  Medications Ordered in ED Medications  acetaminophen (TYLENOL) tablet 1,000 mg (1,000 mg Oral Given 07/05/16 [id number])   SPLINT APPLICATION Date/Time: 0000000 PM Authorized by: Margarita Mail Consent: Verbal consent obtained. Risks and benefits: risks, benefits and alternatives were discussed Consent given by: patient Splint applied by: orthopedic technician Location details: R wrist Splint type: thumb spica Supplies used:  ortho glass Post-procedure: The splinted body part was neurovascularly unchanged following the procedure. Patient tolerance: Patient tolerated the procedure well with no immediate complications.     Initial Impression / Assessment and Plan / ED Course  I have reviewed the triage vital signs and the nursing notes.  Pertinent labs & imaging results that were available during my care of the patient were reviewed by me and considered in my medical decision making (see chart for details).  Clinical Course  Comment By Time  Patient with suspected fracture. Placed in splint and sling. Xr ordered Margarita Mail, PA-C 09/23 1050    Patient with distal radius fracture. Unable to easily assess the scaphoid secondary to fracture and swelling. She'll be placed in a thumb spica splint and given a sling for comfort. She is neurovascularly intact after splint application. She is to follow with Dr. Lenon Curt on  Monday. Discussed reasons to seek immediate medical care. Tylenol for pain appears safe for discharge at this time  Final Clinical Impressions(s) / ED Diagnoses   Final diagnoses:  Wrist fracture, right, closed, initial encounter    New Prescriptions Discharge Medication List as of 07/05/2016  1:24 PM       Margarita Mail, PA-C 07/05/16 2115    Leo Grosser, MD 07/06/16 [phone]

## 2016-07-05 NOTE — ED Notes (Signed)
Pt taken to xray at this time.

## 2016-07-05 NOTE — ED Notes (Signed)
Ortho paged for thumb spica 

## 2016-07-05 NOTE — Discharge Instructions (Signed)
YOu will need to call Dr. Brennan Bailey off first thing on Monday morning. If you have an orthopedist or hand surgeon with which you have an established relationship, you may also call them.  SEEK IMMEDIATE MEDICAL CARE IF: Your hand or fingernails on the injured arm turn blue or gray, or feel cold or numb. You have decreased feeling in the fingers of your injured arm.

## 2016-07-05 NOTE — ED Notes (Signed)
Ortho at bedside.

## 2016-07-05 NOTE — Progress Notes (Signed)
Orthopedic Tech Progress Note Patient Details:  Destiny [name] [birth year] [id number]  Ortho Devices Type of Ortho Device: Arm sling Ortho Device/Splint Interventions: Application   [name] 07/05/2016, 11:06 AM

## 2016-07-07 DIAGNOSIS — S52501A Unspecified fracture of the lower end of right radius, initial encounter for closed fracture: Secondary | ICD-10-CM | POA: Diagnosis not present

## 2016-07-07 DIAGNOSIS — M25531 Pain in right wrist: Secondary | ICD-10-CM | POA: Diagnosis not present

## 2016-07-10 DIAGNOSIS — Z23 Encounter for immunization: Secondary | ICD-10-CM | POA: Diagnosis not present

## 2016-07-10 DIAGNOSIS — E11610 Type 2 diabetes mellitus with diabetic neuropathic arthropathy: Secondary | ICD-10-CM | POA: Diagnosis not present

## 2016-07-10 DIAGNOSIS — E785 Hyperlipidemia, unspecified: Secondary | ICD-10-CM | POA: Diagnosis not present

## 2016-07-14 DIAGNOSIS — S52501D Unspecified fracture of the lower end of right radius, subsequent encounter for closed fracture with routine healing: Secondary | ICD-10-CM | POA: Diagnosis not present

## 2016-07-24 DIAGNOSIS — S52501D Unspecified fracture of the lower end of right radius, subsequent encounter for closed fracture with routine healing: Secondary | ICD-10-CM | POA: Diagnosis not present

## 2016-07-25 ENCOUNTER — Other Ambulatory Visit: Payer: Self-pay

## 2016-07-25 ENCOUNTER — Encounter (HOSPITAL_BASED_OUTPATIENT_CLINIC_OR_DEPARTMENT_OTHER)
Admission: RE | Admit: 2016-07-25 | Discharge: 2016-07-25 | Disposition: A | Discharge status: Home / Self Care | Payer: PPO | Source: Ambulatory Visit | Attending: Orthopedic Surgery | Admitting: Orthopedic Surgery

## 2016-07-25 ENCOUNTER — Encounter (HOSPITAL_BASED_OUTPATIENT_CLINIC_OR_DEPARTMENT_OTHER): Payer: Self-pay | Admitting: *Deleted

## 2016-07-25 ENCOUNTER — Other Ambulatory Visit: Payer: Self-pay | Admitting: Orthopedic Surgery

## 2016-07-25 DIAGNOSIS — M1990 Unspecified osteoarthritis, unspecified site: Secondary | ICD-10-CM | POA: Diagnosis not present

## 2016-07-25 DIAGNOSIS — G4730 Sleep apnea, unspecified: Secondary | ICD-10-CM | POA: Diagnosis not present

## 2016-07-25 DIAGNOSIS — Z87891 Personal history of nicotine dependence: Secondary | ICD-10-CM | POA: Diagnosis not present

## 2016-07-25 DIAGNOSIS — M810 Age-related osteoporosis without current pathological fracture: Secondary | ICD-10-CM | POA: Diagnosis not present

## 2016-07-25 DIAGNOSIS — M8580 Other specified disorders of bone density and structure, unspecified site: Secondary | ICD-10-CM | POA: Diagnosis not present

## 2016-07-25 DIAGNOSIS — Z800 Family history of malignant neoplasm of digestive organs: Secondary | ICD-10-CM | POA: Diagnosis not present

## 2016-07-25 DIAGNOSIS — W19XXXA Unspecified fall, initial encounter: Secondary | ICD-10-CM | POA: Diagnosis not present

## 2016-07-25 DIAGNOSIS — Z801 Family history of malignant neoplasm of trachea, bronchus and lung: Secondary | ICD-10-CM | POA: Diagnosis not present

## 2016-07-25 DIAGNOSIS — Z6839 Body mass index (BMI) 39.0-39.9, adult: Secondary | ICD-10-CM | POA: Diagnosis not present

## 2016-07-25 DIAGNOSIS — S52571A Other intraarticular fracture of lower end of right radius, initial encounter for closed fracture: Secondary | ICD-10-CM | POA: Diagnosis not present

## 2016-07-25 DIAGNOSIS — Z880 Allergy status to penicillin: Secondary | ICD-10-CM | POA: Diagnosis not present

## 2016-07-25 DIAGNOSIS — Z7984 Long term (current) use of oral hypoglycemic drugs: Secondary | ICD-10-CM | POA: Diagnosis not present

## 2016-07-25 DIAGNOSIS — Z8041 Family history of malignant neoplasm of ovary: Secondary | ICD-10-CM | POA: Diagnosis not present

## 2016-07-25 DIAGNOSIS — E119 Type 2 diabetes mellitus without complications: Secondary | ICD-10-CM | POA: Diagnosis not present

## 2016-07-25 DIAGNOSIS — E039 Hypothyroidism, unspecified: Secondary | ICD-10-CM | POA: Diagnosis not present

## 2016-07-25 LAB — BASIC METABOLIC PANEL
ANION GAP: 10 (ref 5–15)
BUN: 16 mg/dL (ref 6–20)
CALCIUM: 9.1 mg/dL (ref 8.9–10.3)
CO2: 23 mmol/L (ref 22–32)
Chloride: 107 mmol/L (ref 101–111)
Creatinine, Ser: 0.85 mg/dL (ref 0.44–1.00)
Glucose, Bld: 144 mg/dL — ABNORMAL HIGH (ref 65–99)
Potassium: 4.3 mmol/L (ref 3.5–5.1)
SODIUM: 140 mmol/L (ref 135–145)

## 2016-07-29 ENCOUNTER — Ambulatory Visit (HOSPITAL_BASED_OUTPATIENT_CLINIC_OR_DEPARTMENT_OTHER): Payer: PPO | Admitting: Certified Registered"

## 2016-07-29 ENCOUNTER — Ambulatory Visit (HOSPITAL_BASED_OUTPATIENT_CLINIC_OR_DEPARTMENT_OTHER)
Admission: RE | Admit: 2016-07-29 | Discharge: 2016-07-30 | Disposition: A | Discharge status: Home / Self Care | Payer: PPO | Source: Ambulatory Visit | Attending: Orthopedic Surgery | Admitting: Orthopedic Surgery

## 2016-07-29 ENCOUNTER — Encounter (HOSPITAL_BASED_OUTPATIENT_CLINIC_OR_DEPARTMENT_OTHER): Payer: Self-pay | Admitting: *Deleted

## 2016-07-29 ENCOUNTER — Encounter (HOSPITAL_BASED_OUTPATIENT_CLINIC_OR_DEPARTMENT_OTHER)
Admission: RE | Disposition: A | Discharge status: Home / Self Care | Payer: Self-pay | Source: Ambulatory Visit | Attending: Orthopedic Surgery

## 2016-07-29 DIAGNOSIS — Z801 Family history of malignant neoplasm of trachea, bronchus and lung: Secondary | ICD-10-CM | POA: Insufficient documentation

## 2016-07-29 DIAGNOSIS — Z6839 Body mass index (BMI) 39.0-39.9, adult: Secondary | ICD-10-CM | POA: Insufficient documentation

## 2016-07-29 DIAGNOSIS — Z880 Allergy status to penicillin: Secondary | ICD-10-CM | POA: Insufficient documentation

## 2016-07-29 DIAGNOSIS — M8580 Other specified disorders of bone density and structure, unspecified site: Secondary | ICD-10-CM | POA: Diagnosis not present

## 2016-07-29 DIAGNOSIS — E039 Hypothyroidism, unspecified: Secondary | ICD-10-CM | POA: Insufficient documentation

## 2016-07-29 DIAGNOSIS — Z87891 Personal history of nicotine dependence: Secondary | ICD-10-CM | POA: Insufficient documentation

## 2016-07-29 DIAGNOSIS — M1990 Unspecified osteoarthritis, unspecified site: Secondary | ICD-10-CM | POA: Diagnosis not present

## 2016-07-29 DIAGNOSIS — Z7984 Long term (current) use of oral hypoglycemic drugs: Secondary | ICD-10-CM | POA: Insufficient documentation

## 2016-07-29 DIAGNOSIS — E119 Type 2 diabetes mellitus without complications: Secondary | ICD-10-CM | POA: Insufficient documentation

## 2016-07-29 DIAGNOSIS — M810 Age-related osteoporosis without current pathological fracture: Secondary | ICD-10-CM | POA: Diagnosis not present

## 2016-07-29 DIAGNOSIS — E139 Other specified diabetes mellitus without complications: Secondary | ICD-10-CM | POA: Diagnosis not present

## 2016-07-29 DIAGNOSIS — G4730 Sleep apnea, unspecified: Secondary | ICD-10-CM | POA: Insufficient documentation

## 2016-07-29 DIAGNOSIS — Z8041 Family history of malignant neoplasm of ovary: Secondary | ICD-10-CM | POA: Insufficient documentation

## 2016-07-29 DIAGNOSIS — S52571A Other intraarticular fracture of lower end of right radius, initial encounter for closed fracture: Secondary | ICD-10-CM | POA: Diagnosis not present

## 2016-07-29 DIAGNOSIS — W19XXXA Unspecified fall, initial encounter: Secondary | ICD-10-CM | POA: Insufficient documentation

## 2016-07-29 DIAGNOSIS — S52502A Unspecified fracture of the lower end of left radius, initial encounter for closed fracture: Secondary | ICD-10-CM | POA: Diagnosis not present

## 2016-07-29 DIAGNOSIS — G8918 Other acute postprocedural pain: Secondary | ICD-10-CM | POA: Diagnosis not present

## 2016-07-29 DIAGNOSIS — Z800 Family history of malignant neoplasm of digestive organs: Secondary | ICD-10-CM | POA: Insufficient documentation

## 2016-07-29 DIAGNOSIS — M25531 Pain in right wrist: Secondary | ICD-10-CM | POA: Diagnosis not present

## 2016-07-29 HISTORY — PX: OPEN REDUCTION INTERNAL FIXATION (ORIF) DISTAL RADIAL FRACTURE: SHX5989

## 2016-07-29 HISTORY — DX: Sleep apnea, unspecified: G47.30

## 2016-07-29 LAB — GLUCOSE, CAPILLARY
GLUCOSE-CAPILLARY: 93 mg/dL (ref 65–99)
Glucose-Capillary: 102 mg/dL — ABNORMAL HIGH (ref 65–99)

## 2016-07-29 SURGERY — OPEN REDUCTION INTERNAL FIXATION (ORIF) DISTAL RADIUS FRACTURE
Anesthesia: General | Site: Wrist | Laterality: Right

## 2016-07-29 MED ORDER — HYDROCODONE-ACETAMINOPHEN 5-325 MG PO TABS: ORAL_TABLET | ORAL | 0 refills | Status: DC

## 2016-07-29 MED ORDER — VANCOMYCIN HCL IN DEXTROSE 1-5 GM/200ML-% IV SOLN
1000.0000 mg | INTRAVENOUS | Status: AC
  Administered 2016-07-29: 1000 mg via INTRAVENOUS

## 2016-07-29 MED ORDER — DEXAMETHASONE SODIUM PHOSPHATE 10 MG/ML IJ SOLN
INTRAMUSCULAR | Status: DC | PRN
  Administered 2016-07-29: 10 mg via INTRAVENOUS

## 2016-07-29 MED ORDER — FENTANYL CITRATE (PF) 100 MCG/2ML IJ SOLN
50.0000 ug | INTRAMUSCULAR | Status: DC | PRN
  Administered 2016-07-29 (×2): 100 ug via INTRAVENOUS

## 2016-07-29 MED ORDER — METOCLOPRAMIDE HCL 5 MG/ML IJ SOLN
INTRAMUSCULAR | Status: AC
  Filled 2016-07-29: qty 2

## 2016-07-29 MED ORDER — FENTANYL CITRATE (PF) 100 MCG/2ML IJ SOLN
25.0000 ug | INTRAMUSCULAR | Status: DC | PRN
  Administered 2016-07-29: 25 ug via INTRAVENOUS

## 2016-07-29 MED ORDER — LACTATED RINGERS IV SOLN
INTRAVENOUS | Status: DC
  Administered 2016-07-29 (×2): via INTRAVENOUS

## 2016-07-29 MED ORDER — CHLORHEXIDINE GLUCONATE 4 % EX LIQD: 60.0000 mL | Freq: Once | CUTANEOUS | Status: DC

## 2016-07-29 MED ORDER — PRAVASTATIN SODIUM 20 MG PO TABS: 20.0000 mg | ORAL_TABLET | Freq: Every day | ORAL | Status: DC

## 2016-07-29 MED ORDER — MIDAZOLAM HCL 2 MG/2ML IJ SOLN
1.0000 mg | INTRAMUSCULAR | Status: DC | PRN
  Administered 2016-07-29: 1 mg via INTRAVENOUS
  Administered 2016-07-29: 2 mg via INTRAVENOUS

## 2016-07-29 MED ORDER — VANCOMYCIN HCL IN DEXTROSE 1-5 GM/200ML-% IV SOLN
1000.0000 mg | Freq: Two times a day (BID) | INTRAVENOUS | Status: AC
  Administered 2016-07-30: 1000 mg via INTRAVENOUS

## 2016-07-29 MED ORDER — MIDAZOLAM HCL 2 MG/2ML IJ SOLN
INTRAMUSCULAR | Status: AC
  Filled 2016-07-29: qty 2

## 2016-07-29 MED ORDER — FENTANYL CITRATE (PF) 100 MCG/2ML IJ SOLN
INTRAMUSCULAR | Status: AC
  Filled 2016-07-29: qty 2

## 2016-07-29 MED ORDER — SCOPOLAMINE 1 MG/3DAYS TD PT72: 1.0000 | MEDICATED_PATCH | Freq: Once | TRANSDERMAL | Status: DC | PRN

## 2016-07-29 MED ORDER — ONDANSETRON HCL 4 MG PO TABS: 4.0000 mg | ORAL_TABLET | Freq: Four times a day (QID) | ORAL | Status: DC | PRN

## 2016-07-29 MED ORDER — METOCLOPRAMIDE HCL 5 MG/ML IJ SOLN
5.0000 mg | Freq: Three times a day (TID) | INTRAMUSCULAR | Status: DC | PRN
  Administered 2016-07-29: 5 mg via INTRAVENOUS
  Filled 2016-07-29: qty 2

## 2016-07-29 MED ORDER — METOCLOPRAMIDE HCL 5 MG PO TABS: 5.0000 mg | ORAL_TABLET | Freq: Three times a day (TID) | ORAL | Status: DC | PRN

## 2016-07-29 MED ORDER — ONDANSETRON HCL 4 MG/2ML IJ SOLN: 4.0000 mg | Freq: Four times a day (QID) | INTRAMUSCULAR | Status: DC | PRN

## 2016-07-29 MED ORDER — GLYCOPYRROLATE 0.2 MG/ML IJ SOLN: 0.2000 mg | Freq: Once | INTRAMUSCULAR | Status: DC | PRN

## 2016-07-29 MED ORDER — TRAMADOL HCL 50 MG PO TABS
50.0000 mg | ORAL_TABLET | Freq: Four times a day (QID) | ORAL | Status: DC
  Administered 2016-07-29 – 2016-07-30 (×3): 50 mg via ORAL
  Filled 2016-07-29 (×3): qty 1

## 2016-07-29 MED ORDER — ZOLPIDEM TARTRATE 5 MG PO TABS: 5.0000 mg | ORAL_TABLET | Freq: Every evening | ORAL | Status: DC | PRN

## 2016-07-29 MED ORDER — LACTATED RINGERS IV SOLN
INTRAVENOUS | Status: DC
  Administered 2016-07-29 – 2016-07-30 (×2): via INTRAVENOUS

## 2016-07-29 MED ORDER — METFORMIN HCL 500 MG PO TABS: 500.0000 mg | ORAL_TABLET | Freq: Two times a day (BID) | ORAL | Status: DC

## 2016-07-29 MED ORDER — BUPIVACAINE-EPINEPHRINE (PF) 0.5% -1:200000 IJ SOLN
INTRAMUSCULAR | Status: DC | PRN
  Administered 2016-07-29: 25 mL via PERINEURAL

## 2016-07-29 MED ORDER — ONDANSETRON HCL 4 MG/2ML IJ SOLN
INTRAMUSCULAR | Status: DC | PRN
  Administered 2016-07-29: 4 mg via INTRAVENOUS

## 2016-07-29 MED ORDER — LEVOTHYROXINE SODIUM 137 MCG PO TABS: 137.0000 ug | ORAL_TABLET | Freq: Every day | ORAL | Status: DC

## 2016-07-29 MED ORDER — MEPERIDINE HCL 25 MG/ML IJ SOLN: 6.2500 mg | INTRAMUSCULAR | Status: DC | PRN

## 2016-07-29 MED ORDER — TRAMADOL HCL 50 MG PO TABS: 50.0000 mg | ORAL_TABLET | Freq: Four times a day (QID) | ORAL | 0 refills | Status: DC | PRN

## 2016-07-29 MED ORDER — PROPOFOL 10 MG/ML IV BOLUS
INTRAVENOUS | Status: DC | PRN
  Administered 2016-07-29: 150 mg via INTRAVENOUS

## 2016-07-29 MED ORDER — VANCOMYCIN HCL IN DEXTROSE 1-5 GM/200ML-% IV SOLN
INTRAVENOUS | Status: AC
  Filled 2016-07-29: qty 200

## 2016-07-29 SURGICAL SUPPLY — 72 items
BANDAGE ACE 3X5.8 VEL STRL LF (GAUZE/BANDAGES/DRESSINGS) ×2 IMPLANT
BIT DRILL 2.0 LNG QUCK RELEASE (BIT) IMPLANT
BIT DRILL 2.8 QUICK RELEASE (BIT) IMPLANT
BLADE SURG 15 STRL LF DISP TIS (BLADE) ×2 IMPLANT
BLADE SURG 15 STRL SS (BLADE) ×4
BNDG CMPR 9X4 STRL LF SNTH (GAUZE/BANDAGES/DRESSINGS) ×1
BNDG ESMARK 4X9 LF (GAUZE/BANDAGES/DRESSINGS) ×2 IMPLANT
BNDG GAUZE ELAST 4 BULKY (GAUZE/BANDAGES/DRESSINGS) ×2 IMPLANT
BNDG PLASTER X FAST 3X3 WHT LF (CAST SUPPLIES) ×20 IMPLANT
BNDG PLSTR 9X3 FST ST WHT (CAST SUPPLIES) ×10
BONE CHIP PRESERV 5CC PCAN5 (Bone Implant) ×2 IMPLANT
CHLORAPREP W/TINT 26ML (MISCELLANEOUS) ×2 IMPLANT
CORDS BIPOLAR (ELECTRODE) ×2 IMPLANT
COVER BACK TABLE 60X90IN (DRAPES) ×2 IMPLANT
COVER MAYO STAND STRL (DRAPES) ×2 IMPLANT
CUFF TOURNIQUET SINGLE 18IN (TOURNIQUET CUFF) IMPLANT
CUFF TOURNIQUET SINGLE 24IN (TOURNIQUET CUFF) ×1 IMPLANT
DRAPE EXTREMITY T 121X128X90 (DRAPE) ×2 IMPLANT
DRAPE OEC MINIVIEW 54X84 (DRAPES) ×2 IMPLANT
DRAPE SURG 17X23 STRL (DRAPES) ×2 IMPLANT
DRILL 2.0 LNG QUICK RELEASE (BIT) ×2
DRILL 2.8 QUICK RELEASE (BIT) ×2
GAUZE SPONGE 4X4 12PLY STRL (GAUZE/BANDAGES/DRESSINGS) ×2 IMPLANT
GAUZE XEROFORM 1X8 LF (GAUZE/BANDAGES/DRESSINGS) ×2 IMPLANT
GLOVE BIO SURGEON STRL SZ 6.5 (GLOVE) ×1 IMPLANT
GLOVE BIO SURGEON STRL SZ7.5 (GLOVE) ×2 IMPLANT
GLOVE BIOGEL PI IND STRL 7.0 (GLOVE) IMPLANT
GLOVE BIOGEL PI IND STRL 8 (GLOVE) ×1 IMPLANT
GLOVE BIOGEL PI IND STRL 8.5 (GLOVE) IMPLANT
GLOVE BIOGEL PI INDICATOR 7.0 (GLOVE) ×2
GLOVE BIOGEL PI INDICATOR 8 (GLOVE) ×2
GLOVE BIOGEL PI INDICATOR 8.5 (GLOVE) ×1
GLOVE SURG ORTHO 8.0 STRL STRW (GLOVE) ×1 IMPLANT
GLOVE SURG SS PI 8.0 STRL IVOR (GLOVE) ×1 IMPLANT
GOWN STRL REUS W/ TWL LRG LVL3 (GOWN DISPOSABLE) ×1 IMPLANT
GOWN STRL REUS W/ TWL XL LVL3 (GOWN DISPOSABLE) IMPLANT
GOWN STRL REUS W/TWL LRG LVL3 (GOWN DISPOSABLE) ×2
GOWN STRL REUS W/TWL XL LVL3 (GOWN DISPOSABLE) ×5 IMPLANT
GRAFT BNE CANC CHIPS 1-8 5CC (Bone Implant) IMPLANT
GUIDEWIRE ORTHO 0.054X6 (WIRE) ×3 IMPLANT
NDL HYPO 25X1 1.5 SAFETY (NEEDLE) IMPLANT
NEEDLE HYPO 25X1 1.5 SAFETY (NEEDLE) IMPLANT
NS IRRIG 1000ML POUR BTL (IV SOLUTION) ×2 IMPLANT
PACK BASIN DAY SURGERY FS (CUSTOM PROCEDURE TRAY) ×2 IMPLANT
PAD CAST 3X4 CTTN HI CHSV (CAST SUPPLIES) ×1 IMPLANT
PADDING CAST ABS 4INX4YD NS (CAST SUPPLIES) ×1
PADDING CAST ABS COTTON 4X4 ST (CAST SUPPLIES) ×1 IMPLANT
PADDING CAST COTTON 3X4 STRL (CAST SUPPLIES) ×2
PLATE R NARROW PROC VDR (Plate) ×1 IMPLANT
SCREW ACTK 2 NL HEX 3.5.11 (Screw) ×1 IMPLANT
SCREW CORT FT 20X2.3XLCK HEX (Screw) IMPLANT
SCREW CORTICAL LOCKING 2.3X20M (Screw) ×12 IMPLANT
SCREW FX20X2.3XSMTH LCK NS CRT (Screw) IMPLANT
SCREW NONLOCK HEX 3.5X12 (Screw) ×2 IMPLANT
SLEEVE SCD COMPRESS KNEE MED (MISCELLANEOUS) IMPLANT
SLING ARM FOAM STRAP LRG (SOFTGOODS) ×1 IMPLANT
STOCKINETTE 4X48 STRL (DRAPES) ×2 IMPLANT
SUCTION FRAZIER HANDLE 10FR (MISCELLANEOUS)
SUCTION TUBE FRAZIER 10FR DISP (MISCELLANEOUS) IMPLANT
SUT ETHILON 3 0 PS 1 (SUTURE) IMPLANT
SUT ETHILON 4 0 PS 2 18 (SUTURE) ×3 IMPLANT
SUT VIC AB 2-0 SH 27 (SUTURE)
SUT VIC AB 2-0 SH 27XBRD (SUTURE) IMPLANT
SUT VIC AB 3-0 PS1 18 (SUTURE)
SUT VIC AB 3-0 PS1 18XBRD (SUTURE) IMPLANT
SUT VICRYL 4-0 PS2 18IN ABS (SUTURE) ×2 IMPLANT
SYR BULB 3OZ (MISCELLANEOUS) ×2 IMPLANT
SYR CONTROL 10ML LL (SYRINGE) IMPLANT
TOWEL OR 17X24 6PK STRL BLUE (TOWEL DISPOSABLE) ×3 IMPLANT
TOWEL OR NON WOVEN STRL DISP B (DISPOSABLE) ×2 IMPLANT
TUBE CONNECTING 20X1/4 (TUBING) IMPLANT
UNDERPAD 30X30 (UNDERPADS AND DIAPERS) ×1 IMPLANT

## 2016-07-29 NOTE — Anesthesia Postprocedure Evaluation (Signed)
Anesthesia Post Note  Patient: Destiny [name]  Procedure(s) Performed: Procedure(s) (LRB): RIGHT OPEN REDUCTION INTERNAL FIXATION (ORIF) DISTAL RADIAL FRACTURE (Right)  Patient location during evaluation: PACU Anesthesia Type: General Level of consciousness: awake and alert Pain management: pain level controlled Vital Signs Assessment: post-procedure vital signs reviewed and stable Respiratory status: spontaneous breathing, nonlabored ventilation, respiratory function stable and patient connected to nasal cannula oxygen Cardiovascular status: blood pressure returned to baseline and stable Postop Assessment: no signs of nausea or vomiting Anesthetic complications: no    Last Vitals:  Vitals:   07/29/16 1630 07/29/16 1645  BP: (!) 156/94 (!) 149/90  Pulse: 83 76  Resp: 17 16  Temp:      Last Pain:  Vitals:   07/29/16 1645  TempSrc:   PainSc: Asleep                 Reginal Lutes

## 2016-07-29 NOTE — Progress Notes (Signed)
Assisted Dr. Crews with right, ultrasound guided, infraclavicular block. Side rails up, monitors on throughout procedure. See vital signs in flow sheet. Tolerated Procedure well. 

## 2016-07-29 NOTE — Op Note (Signed)
I assisted Surgeon(s) and Role:    * Leanora Cover, MD - Primary    * Daryll Brod, MD - Assisting on the Procedure(s): RIGHT OPEN REDUCTION INTERNAL FIXATION (ORIF) DISTAL RADIAL FRACTURE on 07/29/2016.  I provided assistance on this case as follows: approach, identification and debridement of the fracture, manipulation and reduction of the fracture, stabilization and bone grafting of the fracture, application of the plate and screws, closure of the wound and application of the dressing and splint. I was present for the entire case.  Electronically signed by: Wynonia Sours, MD Date: 07/29/2016 Time: 4:09 PM

## 2016-07-29 NOTE — Anesthesia Procedure Notes (Addendum)
Anesthesia Regional Block:  Infraclavicular brachial plexus block  Pre-Anesthetic Checklist: ,, timeout performed, Correct Patient, Correct Site, Correct Laterality, Correct Procedure, Correct Position, site marked, Risks and benefits discussed,  Surgical consent,  Pre-op evaluation,  At surgeon's request and post-op pain management  Laterality: Right and Upper  Prep: chloraprep       Needles:  Injection technique: Single-shot  Needle Type: Echogenic Stimulator Needle     Needle Length: 5cm 5 cm Needle Gauge: 21 and 21 G    Additional Needles:  Procedures: ultrasound guided (picture in chart) Infraclavicular brachial plexus block Narrative:  Start time: 07/29/2016 2:27 PM End time: 07/29/2016 2:32 PM Injection made incrementally with aspirations every 5 mL.  Performed by: Personally  Anesthesiologist: Akaysha Cobern      Right infraclavicular block image

## 2016-07-29 NOTE — Brief Op Note (Signed)
07/29/2016  4:10 PM  PATIENT:  Destiny [name]  70 y.o. female  PRE-OPERATIVE DIAGNOSIS:  Left Distal Radius Fracture  POST-OPERATIVE DIAGNOSIS:  Left Distal Radius Fracture  PROCEDURE:  Procedure(s): RIGHT OPEN REDUCTION INTERNAL FIXATION (ORIF) DISTAL RADIAL FRACTURE (Right)  SURGEON:  Surgeon(s) and Role:    * Leanora Cover, MD - Primary    * Daryll Brod, MD - Assisting  PHYSICIAN ASSISTANT:   ASSISTANTS: Daryll Brod, MD   ANESTHESIA:   regional and general  EBL:  Total I/O In: 1000 [I.V.:1000] Out: 5 [Blood:5]  BLOOD ADMINISTERED:none  DRAINS: none   LOCAL MEDICATIONS USED:  NONE  SPECIMEN:  No Specimen  DISPOSITION OF SPECIMEN:  N/A  COUNTS:  YES  TOURNIQUET:   Total Tourniquet Time Documented: Upper Arm (Right) - 57 minutes Total: Upper Arm (Right) - 57 minutes   DICTATION: .Note written in EPIC  PLAN OF CARE: Discharge to home after PACU  PATIENT DISPOSITION:  PACU - hemodynamically stable.

## 2016-07-29 NOTE — Anesthesia Preprocedure Evaluation (Signed)
Anesthesia Evaluation  Patient identified by MRN, date of birth, ID band Patient awake    Reviewed: Allergy & Precautions, NPO status , Patient's Chart, lab work & pertinent test results  Airway Mallampati: I  TM Distance: >3 FB Neck ROM: Full    Dental  (+) Teeth Intact, Partial Upper, Dental Advisory Given   Pulmonary sleep apnea , former smoker,    breath sounds clear to auscultation       Cardiovascular  Rhythm:Regular Rate:Normal     Neuro/Psych    GI/Hepatic   Endo/Other  diabetesMorbid obesity  Renal/GU      Musculoskeletal   Abdominal   Peds  Hematology   Anesthesia Other Findings   Reproductive/Obstetrics                             Anesthesia Physical Anesthesia Plan  ASA: III  Anesthesia Plan: General   Post-op Pain Management: GA combined w/ Regional for post-op pain   Induction: Intravenous  Airway Management Planned: LMA  Additional Equipment:   Intra-op Plan:   Post-operative Plan: Extubation in OR  Informed Consent: I have reviewed the patients History and Physical, chart, labs and discussed the procedure including the risks, benefits and alternatives for the proposed anesthesia with the patient or authorized representative who has indicated his/her understanding and acceptance.   Dental advisory given  Plan Discussed with: CRNA, Anesthesiologist and Surgeon  Anesthesia Plan Comments:         Anesthesia Quick Evaluation

## 2016-07-29 NOTE — Anesthesia Procedure Notes (Signed)
Procedure Name: LMA Insertion Date/Time: 07/29/2016 2:53 PM Performed by: Melynda Ripple D Pre-anesthesia Checklist: Patient identified, Emergency Drugs available, Suction available and Patient being monitored Patient Re-evaluated:Patient Re-evaluated prior to inductionOxygen Delivery Method: Circle system utilized Preoxygenation: Pre-oxygenation with 100% oxygen Intubation Type: IV induction Ventilation: Mask ventilation without difficulty LMA: LMA inserted LMA Size: 4.0 Number of attempts: 1 Airway Equipment and Method: Bite block Placement Confirmation: positive ETCO2 Tube secured with: Tape Dental Injury: Teeth and Oropharynx as per pre-operative assessment

## 2016-07-29 NOTE — Op Note (Signed)
07/29/2016 [facility]  Operative Note  Pre Op Diagnosis: Right comminuted intraarticular distal radius fracture  Post Op Diagnosis: Right comminuted intraarticular distal radius fracture  Procedure: ORIF Right comminuted intraarticular distal radius fracture, 2 intraarticular fragments  Surgeon: Leanora Cover, MD  Assistant: Daryll Brod, MD  Anesthesia: General and Regional  Fluids: Per anesthesia flow sheet  EBL: minimal  Complications: None  Specimen: None  Tourniquet Time:  Total Tourniquet Time Documented: Upper Arm (Right) - 57 minutes Total: Upper Arm (Right) - 57 minutes   Disposition: Stable to PACU  INDICATIONS:  Destiny [name] is a 70 y.o. female who fell and injured her right wrist 3 weeks ago.  She was initially treated non operatively in a cast and was referred to me for further care.  We discussed nonoperative and operative treatment options.  She wished to proceed with operative fixation.  Risks, benefits, and alternatives of surgery were discussed including the risk of blood loss; infection; damage to nerves, vessels, tendons, ligaments, bone; failure of surgery; need for additional surgery; complications with wound healing; continued pain; nonunion; malunion; stiffness.  We also discussed the possible need for bone graft and the benefits and risks including the possibility of disease transmission.  She voiced understanding of these risks and elected to proceed.   OPERATIVE COURSE:  After being identified preoperatively by myself, the patient and I agreed upon the procedure and site of procedure.  Surgical site was marked.  The risks, benefits and alternatives of the surgery were reviewed and she wished to proceed.  Surgical consent had been signed.  She was given IV Ancef as preoperative antibiotic prophylaxis.  She was transferred to the operating room and placed on the operating room table in supine position with the Right upper extremity on an  armboard. General and Regional anesthesia was induced by the anesthesiologist.  The Right upper extremity was prepped and draped in normal sterile orthopedic fashion.  A surgical pause was performed between the surgeons, anesthesia and operating room staff, and all were in agreement as to the patient, procedure and site of procedure.  Tourniquet at the proximal aspect of the extremity was inflated to 250 mmHg after exsanguination of the limb with an Esmarch bandage.  Standard volar Mallie Mussel approach was used.  The bipolar electrocautery was used to obtain hemostasis.  The superficial and deep portions of the FCR tendon sheath were incised, and the FCR and FPL were swept ulnarly to protect the palmar cutaneous branch of the median nerve.  The brachioradialis was released at the radial side of the radius.  The pronator quadratus was released and elevated with the periosteal elevator.  The fracture site was identified and cleared of soft tissue interposition.  There was callus volarly that was removed.  It was reduced under direct visualization.  There was intraarticular extension at the radial styloid.   The fracture site was packed with cancellous bone chips.  An AcuMed volar distal radial locking plate was selected.  It was secured to the bone with the guidepins.  C-arm was used in AP and lateral projections to ensure appropriate reduction and position of the hardware and adjustments made as necessary.  Standard AO drilling and measuring technique was used.  A single screw was placed in the slotted hole in the shaft of the plate.  The distal holes were filled with locking pegs with the exception of the styloid holes, which were filled with locking screws.  The remaining holes in the shaft of  the plate were filled with nonlocking screws.  Good purchase was obtained.  C-arm was used in AP, lateral and oblique projections to ensure appropriate reduction and position of hardware, which was the case.  There was no  intra-articular penetration.  The wound was copiously irrigated with sterile saline.  Pronator quadratus was repaired back over top of the plate using 4-0 Vicryl suture.  Vicryl suture was placed in the subcutaneous tissues in an inverted interrupted fashion and the skin was closed with 4-0 nylon in a horizontal mattress fashion.  There was good pronation and supination of the wrist without crepitance.  The wound was then dressed with sterile Xeroform, 4x4s, and wrapped with a Kerlix bandage.  A volar splint was placed and wrapped with Kerlix and Ace bandage.  Tourniquet was deflated at 57 minutes.  Fingertips were pink with brisk capillary refill after deflation of the tourniquet.  Operative drapes were broken down.  The patient was awoken from anesthesia safely.  He was transferred back to the stretcher and taken to the PACU in stable condition.  I will see him back in the office in one week for postoperative followup.  I will give her a prescription for Norco 5/325 1-2 tabs PO q6 hours prn pain, dispense #20.    Tennis Must, MD Electronically signed, 07/29/16

## 2016-07-29 NOTE — H&P (Signed)
  Destiny [name] is an 70 y.o. female.   Chief Complaint: right distal radius fracture HPI: 70 yo female states she fell approximately 3-4 weeks ago injuring right wrist.  Initially treated in a cast with subsequent displacement of fracture.  Referred to me for further care.    Allergies:  Allergies  Allergen Reactions  . Penicillins Swelling    Past Medical History:  Diagnosis Date  . Allergy   . Arthritis   . Blood transfusion    in 1980  . Diabetes mellitus   . Osteoporosis    osteopenia  . Sleep apnea    does not use cpap  . Thyroid disease    hypothyroidism    Past Surgical History:  Procedure Laterality Date  . COLONOSCOPY    . LAPAROSCOPIC GASTRIC BANDING    . POLYPECTOMY    . TOTAL ABDOMINAL HYSTERECTOMY W/ BILATERAL SALPINGOOPHORECTOMY    . WISDOM TOOTH EXTRACTION      Family History: Family History  Problem Relation Age of Onset  . Lung cancer Sister   . Ovarian cancer Sister   . Throat cancer Brother     Social History:   reports that she has quit smoking. She has never used smokeless tobacco. She reports that she does not drink alcohol or use drugs.  Medications: Medications Prior to Admission  Medication Sig Dispense Refill  . Ergocalciferol (VITAMIN D2 PO) Take 1 tablet by mouth once a week.      . levothyroxine (SYNTHROID, LEVOTHROID) 137 MCG tablet Take 137 mcg by mouth daily.      . metFORMIN (GLUCOPHAGE) 500 MG tablet Take 500 mg by mouth 2 (two) times daily at 8 am and 10 pm.     . Multiple Vitamin (MULTIVITAMIN PO) Take 1 tablet by mouth daily.      . Multiple Vitamins-Minerals (OCUVITE PO) Take 1 tablet by mouth daily.      . NON FORMULARY Inject 2 mLs as directed every 7 (seven) days. buydureon shot every week    . pravastatin (PRAVACHOL) 20 MG tablet Take 20 mg by mouth daily.      No results found for this or any previous visit (from the past 48 hour(s)).  No results found.   A comprehensive review of systems was  negative.  Blood pressure (!) 145/63, pulse 73, temperature 98.2 F (36.8 C), temperature source Oral, resp. rate 18, height 5\' 4"  (1.626 m), weight 104.8 kg (231 lb), SpO2 96 %.  General appearance: alert, cooperative and appears stated age Head: Normocephalic, without obvious abnormality, atraumatic Neck: supple, symmetrical, trachea midline Resp: clear to auscultation bilaterally Cardio: regular rate and rhythm GI: non-tender Extremities: Intact sensation and capillary refill all digits.  +epl/fpl/io.  No wounds.  Pulses: 2+ and symmetric Skin: Skin color, texture, turgor normal. No rashes or lesions Neurologic: Grossly normal Incision/Wound:none  Assessment/Plan Right distal radius fracture.  Non operative and operative treatment options were discussed with the patient and patient wishes to proceed with operative treatment. Risks, benefits, and alternatives of surgery were discussed and the patient agrees with the plan of care.   Lossie Kalp R 07/29/2016, 1:41 PM

## 2016-07-29 NOTE — Discharge Instructions (Addendum)

## 2016-07-29 NOTE — Transfer of Care (Signed)
Immediate Anesthesia Transfer of Care Note  Patient: Destiny [name]  Procedure(s) Performed: Procedure(s): RIGHT OPEN REDUCTION INTERNAL FIXATION (ORIF) DISTAL RADIAL FRACTURE (Right)  Patient Location: PACU  Anesthesia Type:General and GA combined with regional for post-op pain  Level of Consciousness: awake and alert   Airway & Oxygen Therapy: Patient Spontanous Breathing and Patient connected to face mask oxygen  Post-op Assessment: Report given to RN and Post -op Vital signs reviewed and stable  Post vital signs: Reviewed and stable  Last Vitals:  Vitals:   07/29/16 1618 07/29/16 1619  BP:    Pulse:  98  Resp: (!) 23 17  Temp:      Last Pain:  Vitals:   07/29/16 1311  TempSrc: Oral  PainSc: 3          Complications: No apparent anesthesia complications

## 2016-07-30 ENCOUNTER — Encounter (HOSPITAL_BASED_OUTPATIENT_CLINIC_OR_DEPARTMENT_OTHER): Payer: Self-pay | Admitting: Orthopedic Surgery

## 2016-07-30 DIAGNOSIS — S52571A Other intraarticular fracture of lower end of right radius, initial encounter for closed fracture: Secondary | ICD-10-CM | POA: Diagnosis not present

## 2016-07-30 MED ORDER — VANCOMYCIN HCL IN DEXTROSE 1-5 GM/200ML-% IV SOLN
INTRAVENOUS | Status: AC
  Filled 2016-07-30: qty 200

## 2016-07-30 NOTE — Addendum Note (Signed)
Addendum  created 07/30/16 [id number] by Tawni Millers, CRNA   Charge Capture section accepted

## 2016-08-06 DIAGNOSIS — S52571D Other intraarticular fracture of lower end of right radius, subsequent encounter for closed fracture with routine healing: Secondary | ICD-10-CM | POA: Diagnosis not present

## 2016-08-27 DIAGNOSIS — S52571D Other intraarticular fracture of lower end of right radius, subsequent encounter for closed fracture with routine healing: Secondary | ICD-10-CM | POA: Diagnosis not present

## 2016-09-24 DIAGNOSIS — S52571D Other intraarticular fracture of lower end of right radius, subsequent encounter for closed fracture with routine healing: Secondary | ICD-10-CM | POA: Diagnosis not present

## 2016-10-21 DIAGNOSIS — E119 Type 2 diabetes mellitus without complications: Secondary | ICD-10-CM | POA: Diagnosis not present

## 2016-10-21 DIAGNOSIS — H02831 Dermatochalasis of right upper eyelid: Secondary | ICD-10-CM | POA: Diagnosis not present

## 2016-10-21 DIAGNOSIS — H02834 Dermatochalasis of left upper eyelid: Secondary | ICD-10-CM | POA: Diagnosis not present

## 2016-10-21 DIAGNOSIS — H353131 Nonexudative age-related macular degeneration, bilateral, early dry stage: Secondary | ICD-10-CM | POA: Diagnosis not present

## 2016-10-21 DIAGNOSIS — Z961 Presence of intraocular lens: Secondary | ICD-10-CM | POA: Diagnosis not present

## 2016-10-21 DIAGNOSIS — H04123 Dry eye syndrome of bilateral lacrimal glands: Secondary | ICD-10-CM | POA: Diagnosis not present

## 2016-10-22 DIAGNOSIS — S52571D Other intraarticular fracture of lower end of right radius, subsequent encounter for closed fracture with routine healing: Secondary | ICD-10-CM | POA: Diagnosis not present

## 2016-12-16 DIAGNOSIS — E785 Hyperlipidemia, unspecified: Secondary | ICD-10-CM | POA: Diagnosis not present

## 2016-12-16 DIAGNOSIS — E039 Hypothyroidism, unspecified: Secondary | ICD-10-CM | POA: Diagnosis not present

## 2016-12-16 DIAGNOSIS — E11610 Type 2 diabetes mellitus with diabetic neuropathic arthropathy: Secondary | ICD-10-CM | POA: Diagnosis not present

## 2016-12-16 DIAGNOSIS — I10 Essential (primary) hypertension: Secondary | ICD-10-CM | POA: Diagnosis not present

## 2016-12-16 DIAGNOSIS — E559 Vitamin D deficiency, unspecified: Secondary | ICD-10-CM | POA: Diagnosis not present

## 2016-12-16 DIAGNOSIS — M8580 Other specified disorders of bone density and structure, unspecified site: Secondary | ICD-10-CM | POA: Diagnosis not present

## 2016-12-16 DIAGNOSIS — Z0000 Encounter for general adult medical examination without abnormal findings: Secondary | ICD-10-CM | POA: Diagnosis not present

## 2016-12-18 DIAGNOSIS — M810 Age-related osteoporosis without current pathological fracture: Secondary | ICD-10-CM | POA: Diagnosis not present

## 2016-12-19 DIAGNOSIS — E1140 Type 2 diabetes mellitus with diabetic neuropathy, unspecified: Secondary | ICD-10-CM | POA: Diagnosis not present

## 2016-12-19 DIAGNOSIS — Z0001 Encounter for general adult medical examination with abnormal findings: Secondary | ICD-10-CM | POA: Diagnosis not present

## 2016-12-19 DIAGNOSIS — Z880 Allergy status to penicillin: Secondary | ICD-10-CM | POA: Diagnosis not present

## 2016-12-19 DIAGNOSIS — Z1212 Encounter for screening for malignant neoplasm of rectum: Secondary | ICD-10-CM | POA: Diagnosis not present

## 2016-12-19 DIAGNOSIS — L570 Actinic keratosis: Secondary | ICD-10-CM | POA: Diagnosis not present

## 2016-12-19 DIAGNOSIS — E039 Hypothyroidism, unspecified: Secondary | ICD-10-CM | POA: Diagnosis not present

## 2017-01-12 DIAGNOSIS — M810 Age-related osteoporosis without current pathological fracture: Secondary | ICD-10-CM | POA: Diagnosis not present

## 2017-04-01 DIAGNOSIS — E11610 Type 2 diabetes mellitus with diabetic neuropathic arthropathy: Secondary | ICD-10-CM | POA: Diagnosis not present

## 2017-04-01 DIAGNOSIS — E1140 Type 2 diabetes mellitus with diabetic neuropathy, unspecified: Secondary | ICD-10-CM | POA: Diagnosis not present

## 2017-04-27 DIAGNOSIS — E1140 Type 2 diabetes mellitus with diabetic neuropathy, unspecified: Secondary | ICD-10-CM | POA: Diagnosis not present

## 2017-04-30 DIAGNOSIS — E1121 Type 2 diabetes mellitus with diabetic nephropathy: Secondary | ICD-10-CM | POA: Diagnosis not present

## 2017-08-27 DIAGNOSIS — Z23 Encounter for immunization: Secondary | ICD-10-CM | POA: Diagnosis not present

## 2017-09-23 ENCOUNTER — Inpatient Hospital Stay (HOSPITAL_COMMUNITY)
Admission: EM | Admit: 2017-09-23 | Discharge: 2017-09-30 | DRG: 493 | Disposition: A | Discharge status: Skilled Nursing Facility | Payer: PPO | Attending: Student | Admitting: Student

## 2017-09-23 ENCOUNTER — Other Ambulatory Visit: Payer: Self-pay

## 2017-09-23 ENCOUNTER — Encounter (HOSPITAL_COMMUNITY): Payer: Self-pay

## 2017-09-23 ENCOUNTER — Emergency Department (HOSPITAL_COMMUNITY): Payer: PPO

## 2017-09-23 DIAGNOSIS — S82892A Other fracture of left lower leg, initial encounter for closed fracture: Secondary | ICD-10-CM | POA: Diagnosis present

## 2017-09-23 DIAGNOSIS — S82202D Unspecified fracture of shaft of left tibia, subsequent encounter for closed fracture with routine healing: Secondary | ICD-10-CM | POA: Diagnosis not present

## 2017-09-23 DIAGNOSIS — T148XXA Other injury of unspecified body region, initial encounter: Secondary | ICD-10-CM

## 2017-09-23 DIAGNOSIS — S82402A Unspecified fracture of shaft of left fibula, initial encounter for closed fracture: Secondary | ICD-10-CM

## 2017-09-23 DIAGNOSIS — Z6841 Body Mass Index (BMI) 40.0 and over, adult: Secondary | ICD-10-CM

## 2017-09-23 DIAGNOSIS — S82409A Unspecified fracture of shaft of unspecified fibula, initial encounter for closed fracture: Secondary | ICD-10-CM

## 2017-09-23 DIAGNOSIS — L89629 Pressure ulcer of left heel, unspecified stage: Secondary | ICD-10-CM | POA: Diagnosis present

## 2017-09-23 DIAGNOSIS — S82202A Unspecified fracture of shaft of left tibia, initial encounter for closed fracture: Secondary | ICD-10-CM | POA: Diagnosis present

## 2017-09-23 DIAGNOSIS — G8911 Acute pain due to trauma: Secondary | ICD-10-CM | POA: Diagnosis not present

## 2017-09-23 DIAGNOSIS — Z7982 Long term (current) use of aspirin: Secondary | ICD-10-CM | POA: Diagnosis not present

## 2017-09-23 DIAGNOSIS — G4730 Sleep apnea, unspecified: Secondary | ICD-10-CM | POA: Diagnosis present

## 2017-09-23 DIAGNOSIS — D62 Acute posthemorrhagic anemia: Secondary | ICD-10-CM | POA: Diagnosis not present

## 2017-09-23 DIAGNOSIS — E039 Hypothyroidism, unspecified: Secondary | ICD-10-CM | POA: Diagnosis present

## 2017-09-23 DIAGNOSIS — Z111 Encounter for screening for respiratory tuberculosis: Secondary | ICD-10-CM | POA: Diagnosis not present

## 2017-09-23 DIAGNOSIS — Z87891 Personal history of nicotine dependence: Secondary | ICD-10-CM | POA: Diagnosis not present

## 2017-09-23 DIAGNOSIS — M25572 Pain in left ankle and joints of left foot: Secondary | ICD-10-CM | POA: Diagnosis not present

## 2017-09-23 DIAGNOSIS — W1830XA Fall on same level, unspecified, initial encounter: Secondary | ICD-10-CM | POA: Diagnosis present

## 2017-09-23 DIAGNOSIS — S82852A Displaced trimalleolar fracture of left lower leg, initial encounter for closed fracture: Principal | ICD-10-CM | POA: Diagnosis present

## 2017-09-23 DIAGNOSIS — E079 Disorder of thyroid, unspecified: Secondary | ICD-10-CM | POA: Diagnosis present

## 2017-09-23 DIAGNOSIS — Z7984 Long term (current) use of oral hypoglycemic drugs: Secondary | ICD-10-CM

## 2017-09-23 DIAGNOSIS — S82852D Displaced trimalleolar fracture of left lower leg, subsequent encounter for closed fracture with routine healing: Secondary | ICD-10-CM | POA: Diagnosis not present

## 2017-09-23 DIAGNOSIS — Z79899 Other long term (current) drug therapy: Secondary | ICD-10-CM

## 2017-09-23 DIAGNOSIS — W19XXXA Unspecified fall, initial encounter: Secondary | ICD-10-CM | POA: Diagnosis not present

## 2017-09-23 DIAGNOSIS — E669 Obesity, unspecified: Secondary | ICD-10-CM | POA: Diagnosis not present

## 2017-09-23 DIAGNOSIS — R110 Nausea: Secondary | ICD-10-CM | POA: Diagnosis not present

## 2017-09-23 DIAGNOSIS — S8292XA Unspecified fracture of left lower leg, initial encounter for closed fracture: Secondary | ICD-10-CM | POA: Diagnosis not present

## 2017-09-23 DIAGNOSIS — M810 Age-related osteoporosis without current pathological fracture: Secondary | ICD-10-CM | POA: Diagnosis present

## 2017-09-23 DIAGNOSIS — T148XXD Other injury of unspecified body region, subsequent encounter: Secondary | ICD-10-CM | POA: Diagnosis not present

## 2017-09-23 DIAGNOSIS — S9305XA Dislocation of left ankle joint, initial encounter: Secondary | ICD-10-CM | POA: Diagnosis present

## 2017-09-23 DIAGNOSIS — K621 Rectal polyp: Secondary | ICD-10-CM | POA: Diagnosis not present

## 2017-09-23 DIAGNOSIS — M6281 Muscle weakness (generalized): Secondary | ICD-10-CM | POA: Diagnosis not present

## 2017-09-23 DIAGNOSIS — K5900 Constipation, unspecified: Secondary | ICD-10-CM | POA: Diagnosis not present

## 2017-09-23 DIAGNOSIS — S82209A Unspecified fracture of shaft of unspecified tibia, initial encounter for closed fracture: Secondary | ICD-10-CM | POA: Insufficient documentation

## 2017-09-23 DIAGNOSIS — S82402D Unspecified fracture of shaft of left fibula, subsequent encounter for closed fracture with routine healing: Secondary | ICD-10-CM | POA: Diagnosis not present

## 2017-09-23 DIAGNOSIS — R52 Pain, unspecified: Secondary | ICD-10-CM | POA: Diagnosis not present

## 2017-09-23 DIAGNOSIS — Z419 Encounter for procedure for purposes other than remedying health state, unspecified: Secondary | ICD-10-CM

## 2017-09-23 DIAGNOSIS — A498 Other bacterial infections of unspecified site: Secondary | ICD-10-CM | POA: Diagnosis not present

## 2017-09-23 DIAGNOSIS — E119 Type 2 diabetes mellitus without complications: Secondary | ICD-10-CM

## 2017-09-23 DIAGNOSIS — E785 Hyperlipidemia, unspecified: Secondary | ICD-10-CM | POA: Diagnosis not present

## 2017-09-23 DIAGNOSIS — S92909A Unspecified fracture of unspecified foot, initial encounter for closed fracture: Secondary | ICD-10-CM | POA: Diagnosis not present

## 2017-09-23 DIAGNOSIS — S82842D Displaced bimalleolar fracture of left lower leg, subsequent encounter for closed fracture with routine healing: Secondary | ICD-10-CM | POA: Diagnosis not present

## 2017-09-23 DIAGNOSIS — E569 Vitamin deficiency, unspecified: Secondary | ICD-10-CM | POA: Diagnosis not present

## 2017-09-23 DIAGNOSIS — G8918 Other acute postprocedural pain: Secondary | ICD-10-CM | POA: Diagnosis not present

## 2017-09-23 HISTORY — DX: Dislocation of left ankle joint, initial encounter: S93.05XA

## 2017-09-23 LAB — CBC
HEMATOCRIT: 38.7 % (ref 36.0–46.0)
HEMOGLOBIN: 12.2 g/dL (ref 12.0–15.0)
MCH: 28.2 pg (ref 26.0–34.0)
MCHC: 31.5 g/dL (ref 30.0–36.0)
MCV: 89.4 fL (ref 78.0–100.0)
Platelets: 291 10*3/uL (ref 150–400)
RBC: 4.33 MIL/uL (ref 3.87–5.11)
RDW: 14.3 % (ref 11.5–15.5)
WBC: 9.8 10*3/uL (ref 4.0–10.5)

## 2017-09-23 LAB — CBC WITH DIFFERENTIAL/PLATELET
Basophils Absolute: 0 10*3/uL (ref 0.0–0.1)
Basophils Relative: 0 %
EOS ABS: 0.2 10*3/uL (ref 0.0–0.7)
Eosinophils Relative: 1 %
HCT: 38.6 % (ref 36.0–46.0)
Hemoglobin: 12.5 g/dL (ref 12.0–15.0)
LYMPHS ABS: 2.1 10*3/uL (ref 0.7–4.0)
Lymphocytes Relative: 18 %
MCH: 28.9 pg (ref 26.0–34.0)
MCHC: 32.4 g/dL (ref 30.0–36.0)
MCV: 89.1 fL (ref 78.0–100.0)
MONOS PCT: 4 %
Monocytes Absolute: 0.5 10*3/uL (ref 0.1–1.0)
Neutro Abs: 9 10*3/uL — ABNORMAL HIGH (ref 1.7–7.7)
Neutrophils Relative %: 77 %
PLATELETS: 282 10*3/uL (ref 150–400)
RBC: 4.33 MIL/uL (ref 3.87–5.11)
RDW: 14.4 % (ref 11.5–15.5)
WBC: 11.7 10*3/uL — ABNORMAL HIGH (ref 4.0–10.5)

## 2017-09-23 LAB — HEMOGLOBIN A1C
Hgb A1c MFr Bld: 6.4 % — ABNORMAL HIGH (ref 4.8–5.6)
MEAN PLASMA GLUCOSE: 136.98 mg/dL

## 2017-09-23 LAB — GLUCOSE, CAPILLARY: Glucose-Capillary: 95 mg/dL (ref 65–99)

## 2017-09-23 LAB — COMPREHENSIVE METABOLIC PANEL
ALT: 13 U/L — ABNORMAL LOW (ref 14–54)
ANION GAP: 8 (ref 5–15)
AST: 19 U/L (ref 15–41)
Albumin: 4 g/dL (ref 3.5–5.0)
Alkaline Phosphatase: 68 U/L (ref 38–126)
BUN: 24 mg/dL — ABNORMAL HIGH (ref 6–20)
CALCIUM: 9.1 mg/dL (ref 8.9–10.3)
CHLORIDE: 104 mmol/L (ref 101–111)
CO2: 27 mmol/L (ref 22–32)
Creatinine, Ser: 0.92 mg/dL (ref 0.44–1.00)
GFR calc non Af Amer: >60 mL/min (ref >60)
Glucose, Bld: 141 mg/dL — ABNORMAL HIGH (ref 65–99)
POTASSIUM: 4.2 mmol/L (ref 3.5–5.1)
SODIUM: 139 mmol/L (ref 135–145)
Total Bilirubin: 0.6 mg/dL (ref 0.3–1.2)
Total Protein: 6.8 g/dL (ref 6.5–8.1)

## 2017-09-23 LAB — CREATININE, SERUM
Creatinine, Ser: 0.8 mg/dL (ref 0.44–1.00)
GFR calc Af Amer: >60 mL/min (ref >60)

## 2017-09-23 IMAGING — DX DG ANKLE COMPLETE 3+V*L*
3 series · 3 of 3 positions shown · non-contrast
Comparison: None.

CLINICAL DATA: Status post reduction

EXAM:
LEFT ANKLE COMPLETE - 3+ VIEW

[ankle ap]
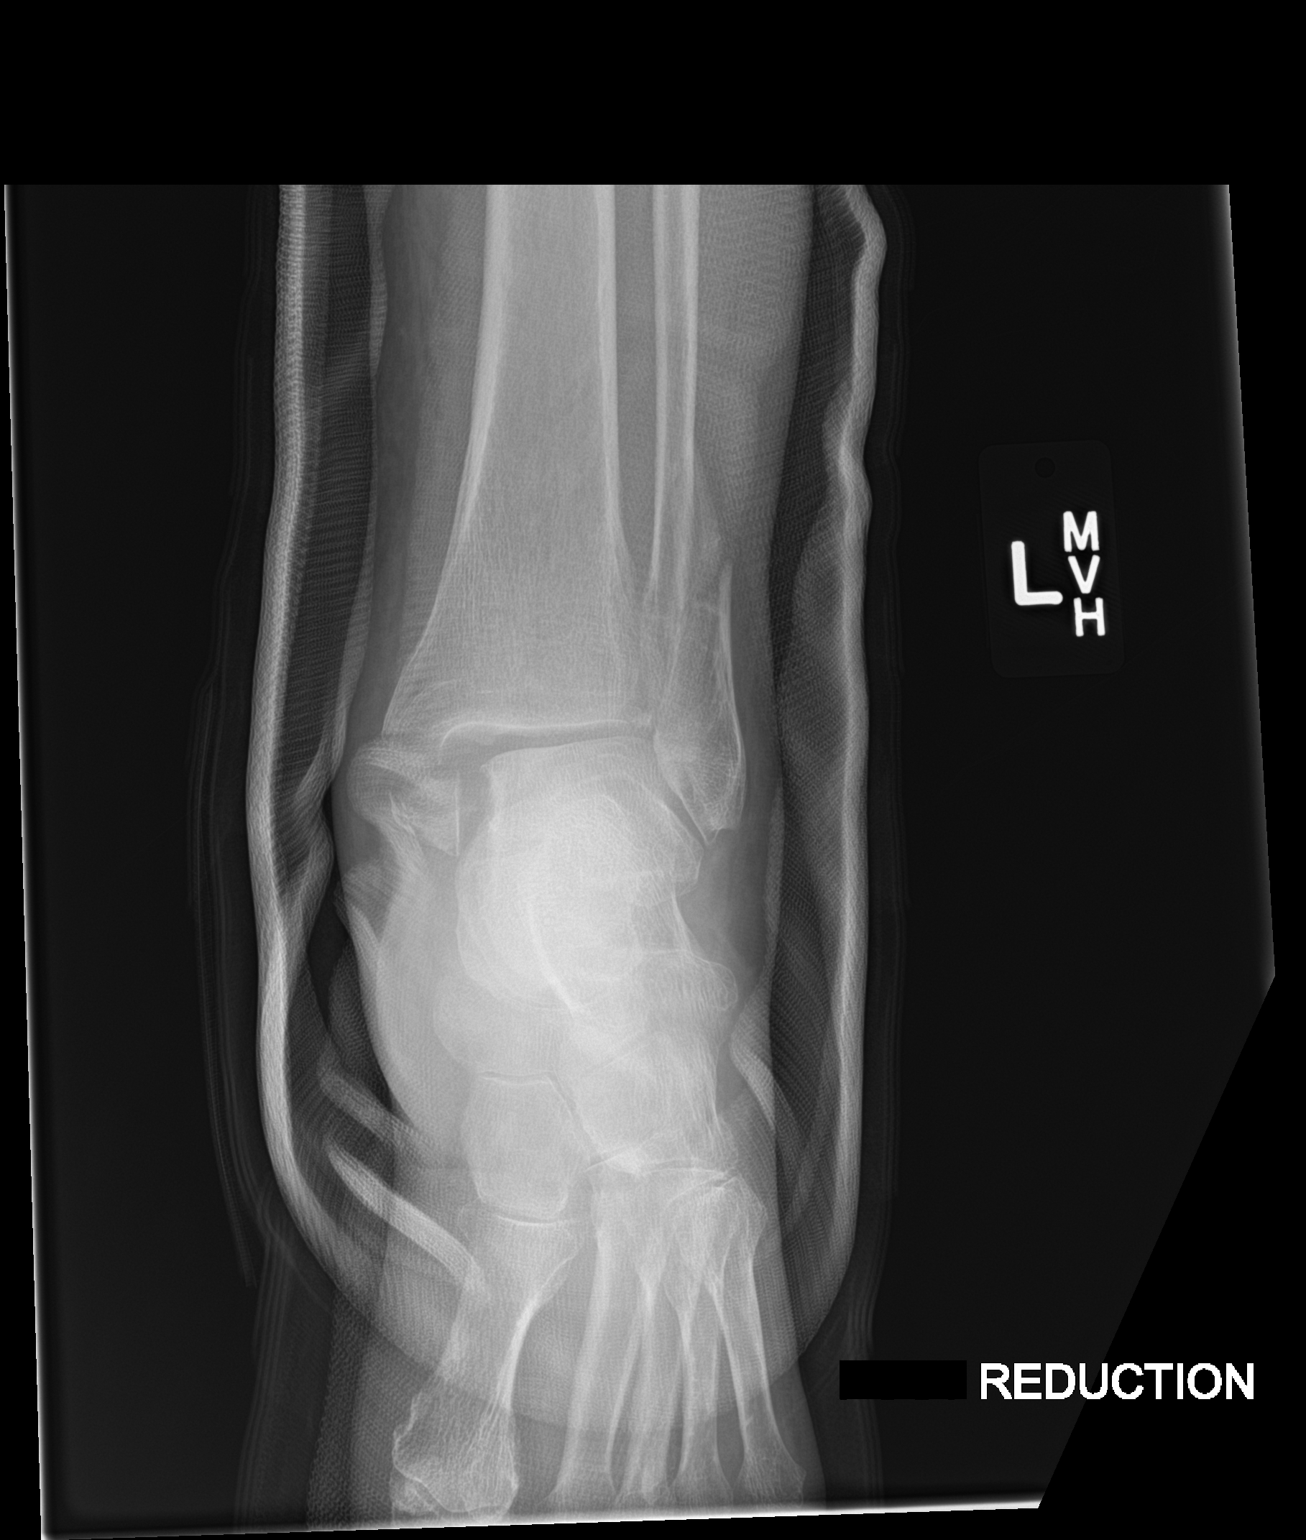

[ankle obl]
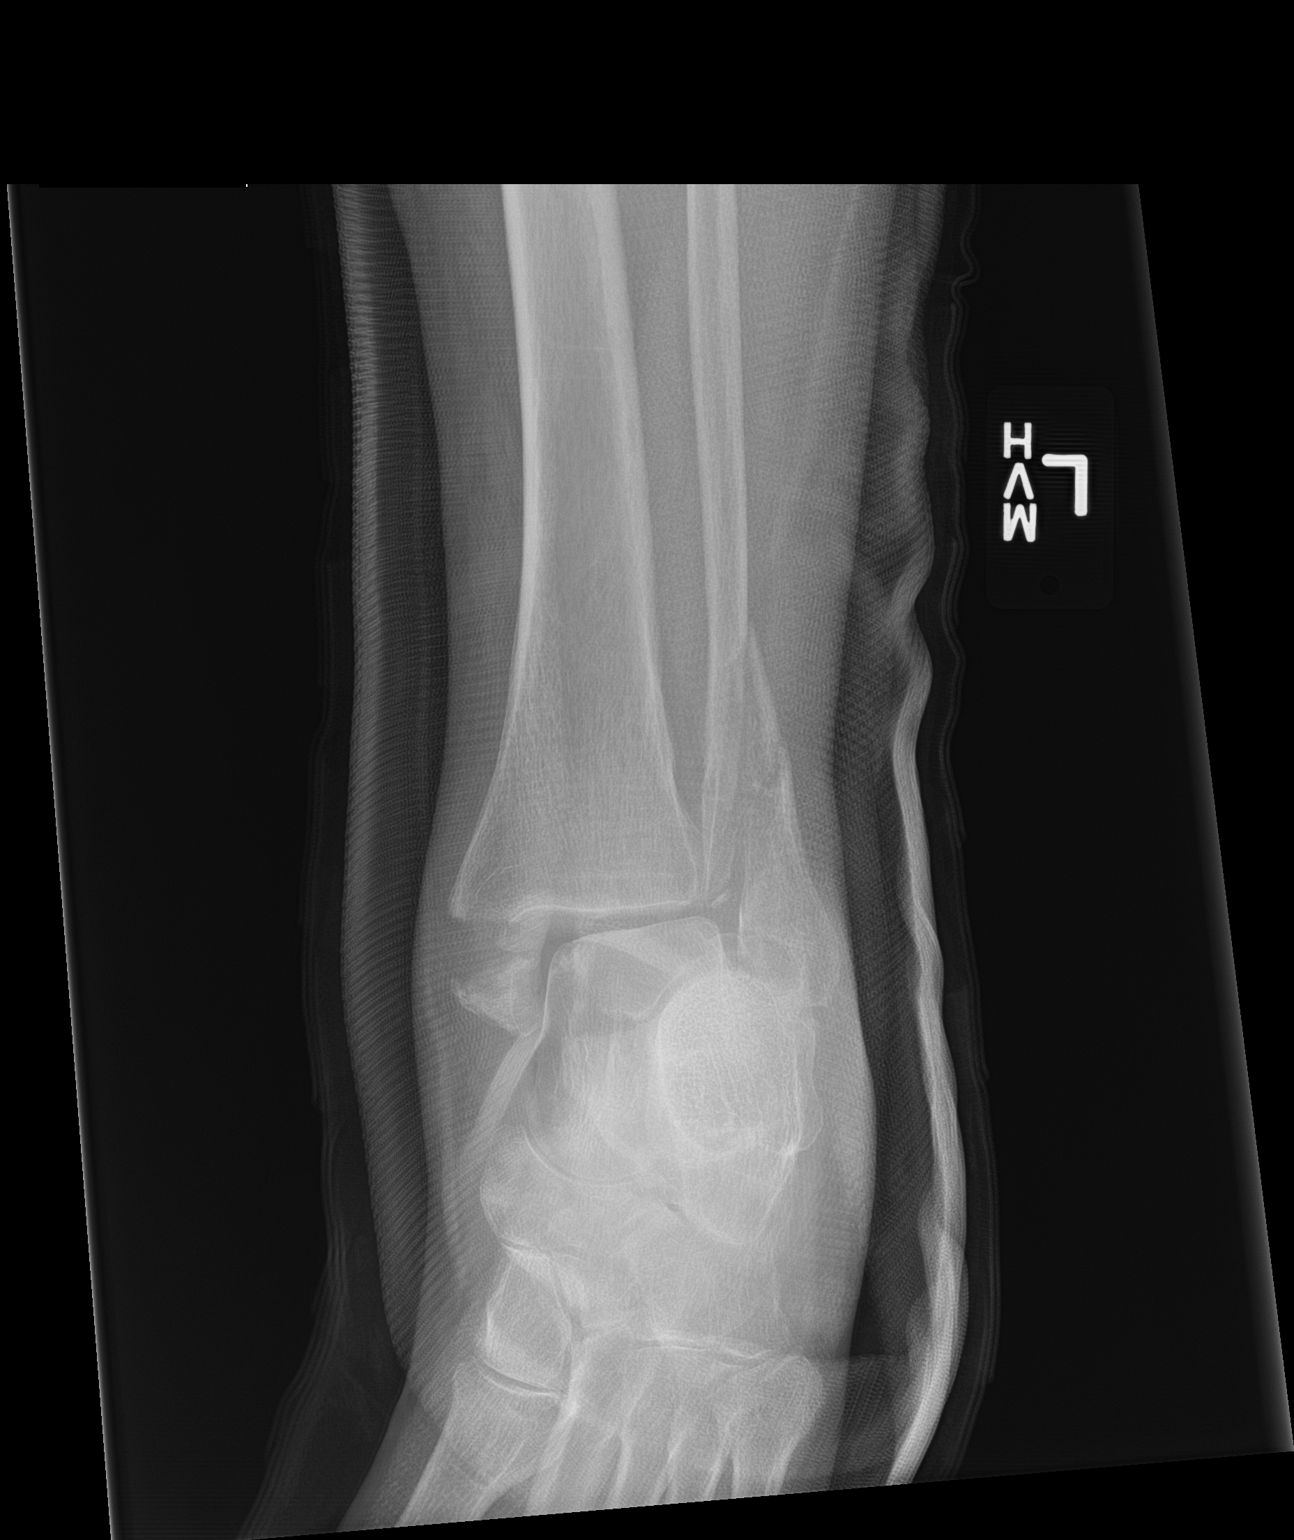

[ankle lat]
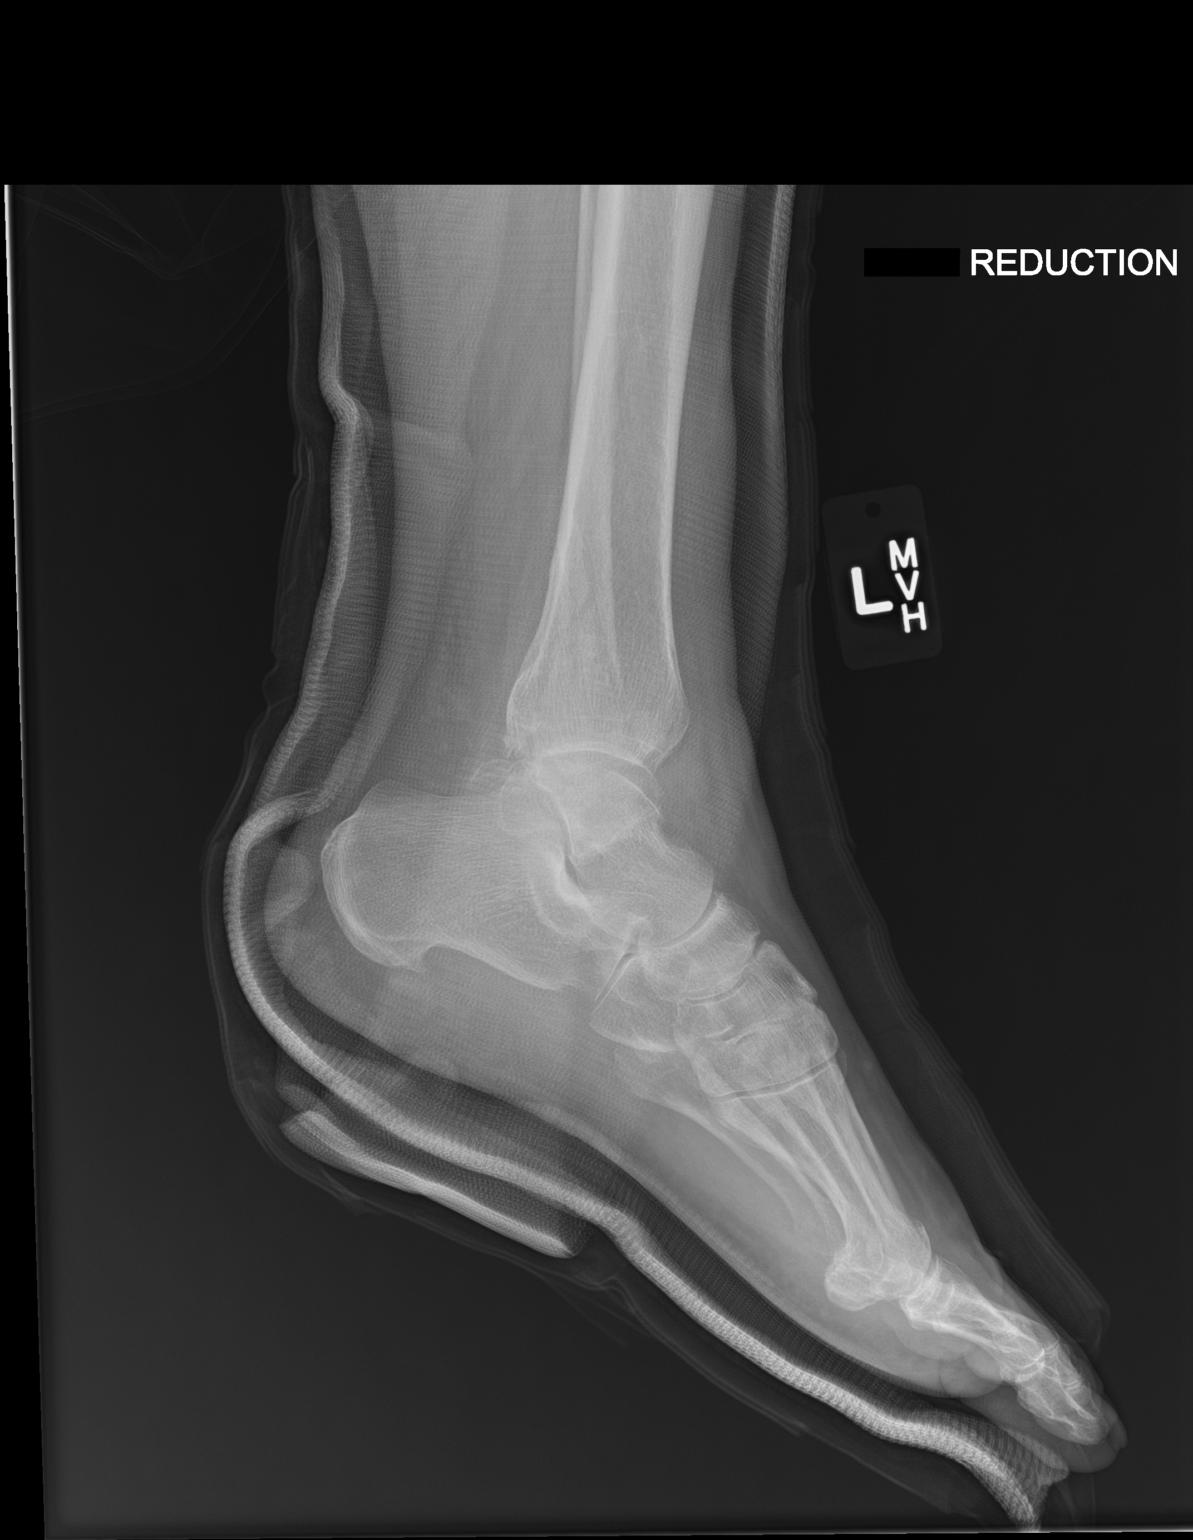

[3 of 3 positions shown; findings below may reference images not displayed]

FINDINGS: There are fractures of the distal tibia and fibula identified. The
talus is somewhat laterally displaced with respect to the distal
tibia although no anterior or posterior displacement is seen. Soft
tissue swelling is noted. No other abnormality is seen.
IMPRESSION: Distal tibial and fibular fractures. Some lateral displacement of
the talus is noted.

## 2017-09-23 MED ORDER — ENOXAPARIN SODIUM 40 MG/0.4ML ~~LOC~~ SOLN: 40.0000 mg | SUBCUTANEOUS | Status: DC

## 2017-09-23 MED ORDER — DOCUSATE SODIUM 100 MG PO CAPS
100.0000 mg | ORAL_CAPSULE | Freq: Two times a day (BID) | ORAL | Status: DC
  Administered 2017-09-24 – 2017-09-30 (×12): 100 mg via ORAL
  Filled 2017-09-23 (×12): qty 1

## 2017-09-23 MED ORDER — MORPHINE SULFATE (PF) 2 MG/ML IV SOLN
1.0000 mg | INTRAVENOUS | Status: DC | PRN
Start: 2017-09-23 — End: 2017-09-23
  Administered 2017-09-23: 2 mg via INTRAVENOUS
  Filled 2017-09-23: qty 1

## 2017-09-23 MED ORDER — MORPHINE SULFATE (PF) 4 MG/ML IV SOLN
1.0000 mg | INTRAVENOUS | Status: DC | PRN
  Administered 2017-09-23 – 2017-09-25 (×3): 1 mg via INTRAVENOUS
  Administered 2017-09-26 – 2017-09-28 (×4): 2 mg via INTRAVENOUS
  Filled 2017-09-23 (×9): qty 1

## 2017-09-23 MED ORDER — ACETAMINOPHEN 10 MG/ML IV SOLN
1000.0000 mg | Freq: Four times a day (QID) | INTRAVENOUS | Status: DC
  Administered 2017-09-23 – 2017-09-24 (×2): 1000 mg via INTRAVENOUS
  Filled 2017-09-23 (×4): qty 100

## 2017-09-23 MED ORDER — ONDANSETRON HCL 4 MG/2ML IJ SOLN: 4.0000 mg | Freq: Three times a day (TID) | INTRAMUSCULAR | Status: AC | PRN

## 2017-09-23 MED ORDER — ONDANSETRON HCL 4 MG/2ML IJ SOLN
4.0000 mg | Freq: Once | INTRAMUSCULAR | Status: AC
  Administered 2017-09-23: 4 mg via INTRAVENOUS
  Filled 2017-09-23: qty 2

## 2017-09-23 MED ORDER — OXYCODONE HCL 5 MG PO TABS: 5.0000 mg | ORAL_TABLET | ORAL | Status: DC | PRN

## 2017-09-23 MED ORDER — LEVOTHYROXINE SODIUM 25 MCG PO TABS
137.0000 ug | ORAL_TABLET | Freq: Every day | ORAL | Status: DC
  Administered 2017-09-25 – 2017-09-30 (×6): 137 ug via ORAL
  Filled 2017-09-23 (×7): qty 1

## 2017-09-23 MED ORDER — KETAMINE HCL 10 MG/ML IJ SOLN
INTRAMUSCULAR | Status: AC | PRN
  Administered 2017-09-23: 103 mg via INTRAVENOUS

## 2017-09-23 MED ORDER — HYDROCODONE-ACETAMINOPHEN 5-325 MG PO TABS: 1.0000 | ORAL_TABLET | ORAL | 0 refills | Status: DC | PRN

## 2017-09-23 MED ORDER — PRAVASTATIN SODIUM 10 MG PO TABS
20.0000 mg | ORAL_TABLET | Freq: Every day | ORAL | Status: DC
  Administered 2017-09-24 – 2017-09-30 (×7): 20 mg via ORAL
  Filled 2017-09-23 (×8): qty 2

## 2017-09-23 MED ORDER — ONDANSETRON 4 MG PO TBDP: 4.0000 mg | ORAL_TABLET | Freq: Three times a day (TID) | ORAL | 0 refills | Status: DC | PRN

## 2017-09-23 MED ORDER — INSULIN ASPART 100 UNIT/ML ~~LOC~~ SOLN
0.0000 [IU] | Freq: Three times a day (TID) | SUBCUTANEOUS | Status: DC
  Administered 2017-09-24: 2 [IU] via SUBCUTANEOUS
  Administered 2017-09-24: 4 [IU] via SUBCUTANEOUS

## 2017-09-23 MED ORDER — KETAMINE HCL-SODIUM CHLORIDE 100-0.9 MG/10ML-% IV SOSY
1.0000 mg/kg | PREFILLED_SYRINGE | Freq: Once | INTRAVENOUS | Status: AC
Start: 2017-09-23 — End: 2017-09-23
  Administered 2017-09-23: 55 mg via INTRAVENOUS
  Filled 2017-09-23: qty 20

## 2017-09-23 MED ORDER — SODIUM CHLORIDE 0.9 % IV SOLN
INTRAVENOUS | Status: AC | PRN
  Administered 2017-09-23: 1000 mL via INTRAVENOUS

## 2017-09-23 MED ORDER — PROPOFOL 10 MG/ML IV BOLUS
1.0000 mg/kg | Freq: Once | INTRAVENOUS | Status: DC
  Filled 2017-09-23: qty 20

## 2017-09-23 MED ORDER — HYDROMORPHONE HCL 1 MG/ML IJ SOLN: 0.5000 mg | INTRAMUSCULAR | Status: DC | PRN

## 2017-09-23 NOTE — ED Notes (Addendum)
Pt was up for discharge, however pt has changed mind about being discharged and would now like to be admitted. Waiting to hear back from the ED Provider.

## 2017-09-23 NOTE — Progress Notes (Signed)
Orthopedic Tech Progress Note Patient Details:  Destiny [name] May 31, 1946 [id number] Patient exceeds age limit for use of crutches.  Patient ID: LEMOYNE SCARPATI, female   DOB: [birth year], 71 y.o.   MRN: [id number]   Braulio Bosch 09/23/2017, 9:18 PM

## 2017-09-23 NOTE — Progress Notes (Signed)
Orthopaedic Trauma Service Progress Note  Discussed case with Dr. Thomasene Lot Initially we were going to see the pt in our office directly from ED. However, pt could not mobilize in ED and it was decided that admission would be best  Would anticipate that pt will need to go to OR tomorrow for External fixation   Pt will be transferred to [facility] tonight as our OR time is at the main Trauma center   Will see pt once she arrives at [facility]. We have [facility] cases [facility] which prevent [place] from getting over to [facility] at this time   Jari Pigg, PA-C [facility] [phone] (C) [phone] (O) 09/23/2017 5:45 PM

## 2017-09-23 NOTE — ED Triage Notes (Signed)
Pt brought in via [facility], Pt slipped on ice in driveway getting out of car.

## 2017-09-23 NOTE — ED Notes (Signed)
Ortho Tech called and is in route to ED.

## 2017-09-23 NOTE — H&P (Signed)
Orthopaedic Trauma Service (OTS) Consult/H&P  Patient ID: Destiny [name] MRN: [id number] DOB/AGE: 01-21-1946 71 y.o.    Reason for Consult: left ankle fracture dislocation  Referring Physician:  Zenovia Jarred, MD    HPI: Destiny [name] is an 71 y.o. white female with history of diabetes, osteoporosis, thyroid disease who sustained a ground-level fall earlier this morning resulting in an injury to her left ankle.  Patient was unable to get up and bear weight on her left leg.  She was taken to [facility] long hospital where she was found to have ankle fracture.  EDPreports that there was a fracture dislocation.  No injury films obtained initially due to gross deformity.  X-rays obtained after reduction.  We were contacted regarding patient's injury.  Initially it was thought that the patient would be able to discharge home with assistance however after a trial with therapy in the emergency department she was unable to mobilize necessitating admission.  The patient was transferred to [facility] for admission to the orthopedic trauma service.  Postreduction films showed persistent subluxation of her ankle.  Patient admitted to [address] to the orthopedic trauma service no other complaints other than left ankle pain  States that her diabetes is fairly well controlled.  Sugars typically run in the 100s  Patient denies any additional acute injuries  Past Medical History:  Diagnosis Date  . Allergy   . Ankle dislocation, left, initial encounter 09/23/2017  . Arthritis   . Blood transfusion    in 1980  . Diabetes mellitus   . Osteoporosis    osteopenia  . Sleep apnea    does not use cpap  . Thyroid disease    hypothyroidism    Past Surgical History:  Procedure Laterality Date  . COLONOSCOPY    . LAPAROSCOPIC GASTRIC BANDING    . OPEN REDUCTION INTERNAL FIXATION (ORIF) DISTAL RADIAL FRACTURE Right 07/29/2016   Procedure: RIGHT OPEN REDUCTION INTERNAL FIXATION (ORIF) DISTAL RADIAL  FRACTURE;  Surgeon: Leanora Cover, MD;  Location: [place];  Service: Orthopedics;  Laterality: Right;  . POLYPECTOMY    . TOTAL ABDOMINAL HYSTERECTOMY W/ BILATERAL SALPINGOOPHORECTOMY    . WISDOM TOOTH EXTRACTION      Family History  Problem Relation Age of Onset  . Lung cancer Sister   . Ovarian cancer Sister   . Throat cancer Brother     Social History:  reports that she has quit smoking. she has never used smokeless tobacco. She reports that she does not drink alcohol or use drugs.  Allergies:  Allergies  Allergen Reactions  . Penicillins Swelling    Has patient had a PCN reaction causing immediate rash, facial/tongue/throat swelling, SOB or lightheadedness with hypotension: Yes Has patient had a PCN reaction causing severe rash involving mucus membranes or skin necrosis: unknown Has patient had a PCN reaction that required hospitalization: No Has patient had a PCN reaction occurring within the last 10 years: No If all of the above answers are "NO", then may proceed with Cephalosporin use.     Medications: I have reviewed the patient's current medications.  Current Meds  Medication Sig  . aspirin EC 81 MG tablet Take 81 mg by mouth daily.  . Cholecalciferol (VITAMIN D-1000 MAX ST) 1000 units tablet Take 1,000 mg by mouth daily.  . Ergocalciferol (VITAMIN D2 PO) Take 1 tablet by mouth once a week.    . levothyroxine (SYNTHROID, LEVOTHROID) 137 MCG tablet Take 137 mcg by mouth daily.    Marland Kitchen  metFORMIN (GLUCOPHAGE) 500 MG tablet Take 500 mg by mouth 2 (two) times daily at 8 am and 10 pm.   . metFORMIN (GLUCOPHAGE-XR) 500 MG 24 hr tablet Take 1,000 mg by mouth 2 (two) times daily.  . Multiple Vitamins-Minerals (OCUVITE PO) Take 1 tablet by mouth daily.    . pravastatin (PRAVACHOL) 20 MG tablet Take 20 mg by mouth daily.     Results for orders placed or performed during the hospital encounter of 09/23/17 (from the past 48 hour(s))  CBC with Differential/Platelet      Status: Abnormal   Collection Time: 09/23/17 11:36 AM  Result Value Ref Range   WBC 11.7 (H) 4.0 - 10.5 K/uL   RBC 4.33 3.87 - 5.11 MIL/uL   Hemoglobin 12.5 12.0 - 15.0 g/dL   HCT 38.6 36.0 - 46.0 %   MCV 89.1 78.0 - 100.0 fL   MCH 28.9 26.0 - 34.0 pg   MCHC 32.4 30.0 - 36.0 g/dL   RDW 14.4 11.5 - 15.5 %   Platelets 282 150 - 400 K/uL   Neutrophils Relative % 77 %   Neutro Abs 9.0 (H) 1.7 - 7.7 K/uL   Lymphocytes Relative 18 %   Lymphs Abs 2.1 0.7 - 4.0 K/uL   Monocytes Relative 4 %   Monocytes Absolute 0.5 0.1 - 1.0 K/uL   Eosinophils Relative 1 %   Eosinophils Absolute 0.2 0.0 - 0.7 K/uL   Basophils Relative 0 %   Basophils Absolute 0.0 0.0 - 0.1 K/uL  Comprehensive metabolic panel     Status: Abnormal   Collection Time: 09/23/17 11:36 AM  Result Value Ref Range   Sodium 139 135 - 145 mmol/L   Potassium 4.2 3.5 - 5.1 mmol/L   Chloride 104 101 - 111 mmol/L   CO2 27 22 - 32 mmol/L   Glucose, Bld 141 (H) 65 - 99 mg/dL   BUN 24 (H) 6 - 20 mg/dL   Creatinine, Ser 0.92 0.44 - 1.00 mg/dL   Calcium 9.1 8.9 - 10.3 mg/dL   Total Protein 6.8 6.5 - 8.1 g/dL   Albumin 4.0 3.5 - 5.0 g/dL   AST 19 15 - 41 U/L   ALT 13 (L) 14 - 54 U/L   Alkaline Phosphatase 68 38 - 126 U/L   Total Bilirubin 0.6 0.3 - 1.2 mg/dL   GFR calc non Af Amer >60 >60 mL/min   GFR calc Af Amer >60 >60 mL/min    Comment: (NOTE) The eGFR has been calculated using the CKD EPI equation. This calculation has not been validated in all clinical situations. eGFR's persistently <60 mL/min signify possible Chronic Kidney Disease.    Anion gap 8 5 - 15  CBC     Status: None   Collection Time: 09/23/17  6:48 PM  Result Value Ref Range   WBC 9.8 4.0 - 10.5 K/uL   RBC 4.33 3.87 - 5.11 MIL/uL   Hemoglobin 12.2 12.0 - 15.0 g/dL   HCT 38.7 36.0 - 46.0 %   MCV 89.4 78.0 - 100.0 fL   MCH 28.2 26.0 - 34.0 pg   MCHC 31.5 30.0 - 36.0 g/dL   RDW 14.3 11.5 - 15.5 %   Platelets 291 150 - 400 K/uL  Creatinine, serum      Status: None   Collection Time: 09/23/17  6:48 PM  Result Value Ref Range   Creatinine, Ser 0.80 0.44 - 1.00 mg/dL   GFR calc non Af Amer >60 >60 mL/min  GFR calc Af Amer >60 >60 mL/min    Comment: (NOTE) The eGFR has been calculated using the CKD EPI equation. This calculation has not been validated in all clinical situations. eGFR's persistently <60 mL/min signify possible Chronic Kidney Disease.   Glucose, capillary     Status: None   Collection Time: 09/23/17  8:49 PM  Result Value Ref Range   Glucose-Capillary 95 65 - 99 mg/dL    Dg Ankle Complete Left  Result Date: 09/23/2017 CLINICAL DATA:  Status post reduction EXAM: LEFT ANKLE COMPLETE - 3+ VIEW COMPARISON:  None. FINDINGS: There are fractures of the distal tibia and fibula identified. The talus is somewhat laterally displaced with respect to the distal tibia although no anterior or posterior displacement is seen. Soft tissue swelling is noted. No other abnormality is seen. IMPRESSION: Distal tibial and fibular fractures. Some lateral displacement of the talus is noted. Electronically Signed   By: Inez Catalina M.D.   On: 09/23/2017 13:08    Review of Systems  Constitutional: Negative for chills and fever.  Respiratory: Negative for shortness of breath and wheezing.   Cardiovascular: Negative for chest pain and palpitations.  Gastrointestinal: Negative for abdominal pain, nausea and vomiting.  Musculoskeletal: Positive for joint pain (L ankle ).  Neurological: Negative for tingling and sensory change.   Blood pressure (!) 96/54, pulse (!) 54, temperature 98.1 F (36.7 C), temperature source Oral, resp. rate 18, height '5\' 3"'$  (1.6 m), weight 115.2 kg (254 lb), SpO2 94 %. Physical Exam  Constitutional: She is oriented to person, place, and time. She is cooperative.  Obese white female  Cardiovascular: Normal rate, regular rhythm, S1 normal and S2 normal.  Pulmonary/Chest: Effort normal and breath sounds normal. No  respiratory distress.  Abdominal: Soft.  Obese, + BS, NT  Musculoskeletal:  Left Lower Extremity  Inspection: Short leg posterior and stirrup splint applied Lower leg is externally rotated No acute findings about the hip   Bony eval: Knee nontender Hip nontender, no pain will axial load or log rolling of L leg  Did not remove splint to evaluate L ankle Soft tissue: Did not remove splint to evaluate L ankle soft tissue, pt is currently comfortable and plan for ex fix in am  Sensation: DPN, SPN, TN sensation grossly intact Motor: EHL, FHL, lesser toe motor intact  Vascular:  + DP pulse No pain with passive stretching of toes  Ext warm   Right Lower Extremity   No traumatic wounds, ecchymosis, or rash  Nontender  No knee or ankle effusion             Hip nontender, no pain axial loading or log rolling of hip   Knee stable to varus/ valgus and anterior/posterior stress             Ankle nontender   Sens DPN, SPN, TN intact  Motor EHL, ext, flex, evers 5/5  DP 2+, PT 2+, No significant edema  Bilateral Upper extremitis              shoulder, elbow, wrist, digits- no skin wounds, nontender, no instability, no blocks to motion  Sens  Ax/R/M/U intact  Mot   Ax/ R/ PIN/ M/ AIN/ U intact  Rad 2+    Neurological: She is alert and oriented to person, place, and time.  Psychiatric: She has a normal mood and affect. Her speech is normal. Cognition and memory are normal.  Nursing note and vitals reviewed.    Assessment/Plan:  71  y/o female s/p ground level fall with L ankle fracture dislocation   -L trimalleolar ankle fracture dislocation with persistent subluxation   OR tomorrow for Ex fix   NWB L leg   Ice and elevate to level of heart tonight    - Pain management:  Scheduled tylenol  Low dose narcotics   - ABL anemia/Hemodynamics  Stable  - Medical issues   DM   SSI    Resume metformin post op    Hypothyroidism   Home meds  - DVT/PE prophylaxis:  lovenox  post op   - ID:   periop abx  - Metabolic Bone Disease:  Check vitamin D and metabolic bone labs    - Activity:  Bed rest for now  NWB L leg   - FEN/GI prophylaxis/Foley/Lines:  NPO after MN  CHO mod diet    - Dispo:  OR tomorrow for Ex Fix L ankle    Jari Pigg, PA-C [facility] [phone] (C) [phone] (O) 09/23/2017, 10:04 PM
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
## 2017-09-23 NOTE — Anesthesia Preprocedure Evaluation (Addendum)
Anesthesia Evaluation  Patient identified by MRN, date of birth, ID band Patient awake    Reviewed: Allergy & Precautions, NPO status , Patient's Chart, lab work & pertinent test results  History of Anesthesia Complications (+) PONVNegative for: history of anesthetic complications  Airway Mallampati: II  TM Distance: >3 FB     Dental  (+) Edentulous Upper, Dental Advisory Given   Pulmonary neg pulmonary ROS, sleep apnea , former smoker,    Pulmonary exam normal breath sounds clear to auscultation       Cardiovascular negative cardio ROS Normal cardiovascular exam     Neuro/Psych negative neurological ROS  negative psych ROS   GI/Hepatic negative GI ROS, Neg liver ROS,   Endo/Other  diabetesMorbid obesity  Renal/GU negative Renal ROS     Musculoskeletal   Abdominal   Peds  Hematology   Anesthesia Other Findings   Reproductive/Obstetrics                            Anesthesia Physical  Anesthesia Plan  ASA: III  Anesthesia Plan: General   Post-op Pain Management: GA combined w/ Regional for post-op pain   Induction: Intravenous  PONV Risk Score and Plan: 4 or greater and Ondansetron, Dexamethasone, Diphenhydramine and Scopolamine patch - Pre-op  Airway Management Planned: LMA  Additional Equipment:   Intra-op Plan:   Post-operative Plan: Extubation in OR  Informed Consent: I have reviewed the patients History and Physical, chart, labs and discussed the procedure including the risks, benefits and alternatives for the proposed anesthesia with the patient or authorized representative who has indicated his/her understanding and acceptance.   Dental advisory given  Plan Discussed with: CRNA, Anesthesiologist and Surgeon  Anesthesia Plan Comments:        Anesthesia Quick Evaluation

## 2017-09-23 NOTE — ED Notes (Signed)
Called [facility] for Transfer to [facility], ride is in route.

## 2017-09-23 NOTE — Progress Notes (Signed)
Patient seen and evaluated provisionally with Ainsley Spinner, PA-C, after transfer from [facility] ED to [facility] as unable to tolerate NWB safely.  Admitted to Dr. Doreatha Martin with his full H&P attestation to follow tomorrow. Preliminary plan for external fixation, delayed ORIF, and rehabilitation plan reviewed with the patient and her son, [name], who was at the bedside.  [place], MD [facility] [phone] (p)

## 2017-09-24 ENCOUNTER — Encounter (HOSPITAL_COMMUNITY)
Admission: EM | Disposition: A | Discharge status: Skilled Nursing Facility | Payer: Self-pay | Source: Home / Self Care | Attending: Student

## 2017-09-24 ENCOUNTER — Inpatient Hospital Stay (HOSPITAL_COMMUNITY): Payer: PPO

## 2017-09-24 ENCOUNTER — Inpatient Hospital Stay (HOSPITAL_COMMUNITY): Payer: PPO | Admitting: Anesthesiology

## 2017-09-24 DIAGNOSIS — S82852A Displaced trimalleolar fracture of left lower leg, initial encounter for closed fracture: Secondary | ICD-10-CM

## 2017-09-24 HISTORY — PX: EXTERNAL FIXATION LEG: SHX1549

## 2017-09-24 LAB — CBC
HEMATOCRIT: 38 % (ref 36.0–46.0)
Hemoglobin: 11.7 g/dL — ABNORMAL LOW (ref 12.0–15.0)
MCH: 28 pg (ref 26.0–34.0)
MCHC: 30.8 g/dL (ref 30.0–36.0)
MCV: 90.9 fL (ref 78.0–100.0)
Platelets: 253 10*3/uL (ref 150–400)
RBC: 4.18 MIL/uL (ref 3.87–5.11)
RDW: 14.9 % (ref 11.5–15.5)
WBC: 8.8 10*3/uL (ref 4.0–10.5)

## 2017-09-24 LAB — COMPREHENSIVE METABOLIC PANEL
ALBUMIN: 3.6 g/dL (ref 3.5–5.0)
ALK PHOS: 76 U/L (ref 38–126)
ALT: 13 U/L — AB (ref 14–54)
ANION GAP: 8 (ref 5–15)
AST: 18 U/L (ref 15–41)
BILIRUBIN TOTAL: 0.8 mg/dL (ref 0.3–1.2)
BUN: 15 mg/dL (ref 6–20)
CALCIUM: 8.4 mg/dL — AB (ref 8.9–10.3)
CO2: 26 mmol/L (ref 22–32)
CREATININE: 0.95 mg/dL (ref 0.44–1.00)
Chloride: 104 mmol/L (ref 101–111)
GFR calc Af Amer: >60 mL/min (ref >60)
GFR calc non Af Amer: 58 mL/min — ABNORMAL LOW (ref >60)
GLUCOSE: 159 mg/dL — AB (ref 65–99)
Potassium: 4.4 mmol/L (ref 3.5–5.1)
Sodium: 138 mmol/L (ref 135–145)
TOTAL PROTEIN: 6.9 g/dL (ref 6.5–8.1)

## 2017-09-24 LAB — GLUCOSE, CAPILLARY
GLUCOSE-CAPILLARY: 136 mg/dL — AB (ref 65–99)
GLUCOSE-CAPILLARY: 170 mg/dL — AB (ref 65–99)
GLUCOSE-CAPILLARY: 150 mg/dL — AB (ref 65–99)
Glucose-Capillary: 119 mg/dL — ABNORMAL HIGH (ref 65–99)
Glucose-Capillary: 161 mg/dL — ABNORMAL HIGH (ref 65–99)

## 2017-09-24 LAB — MAGNESIUM: MAGNESIUM: 1.4 mg/dL — AB (ref 1.7–2.4)

## 2017-09-24 LAB — SURGICAL PCR SCREEN
MRSA, PCR: NEGATIVE
Staphylococcus aureus: NEGATIVE

## 2017-09-24 LAB — PREALBUMIN: Prealbumin: 23.1 mg/dL (ref 18–38)

## 2017-09-24 LAB — PHOSPHORUS: Phosphorus: 3.7 mg/dL (ref 2.5–4.6)

## 2017-09-24 LAB — TSH: TSH: 0.28 u[IU]/mL — ABNORMAL LOW (ref 0.350–4.500)

## 2017-09-24 IMAGING — RF DG C-ARM 61-120 MIN
1 series · 8 of 8 positions shown · non-contrast
Comparison: [DATE]

CLINICAL DATA: External fixation

EXAM:
LEFT ANKLE - 2 VIEW; DG C-ARM 61-120 MIN

[Series 1: run · 8 of 8 slices shown]
[im 1/8]
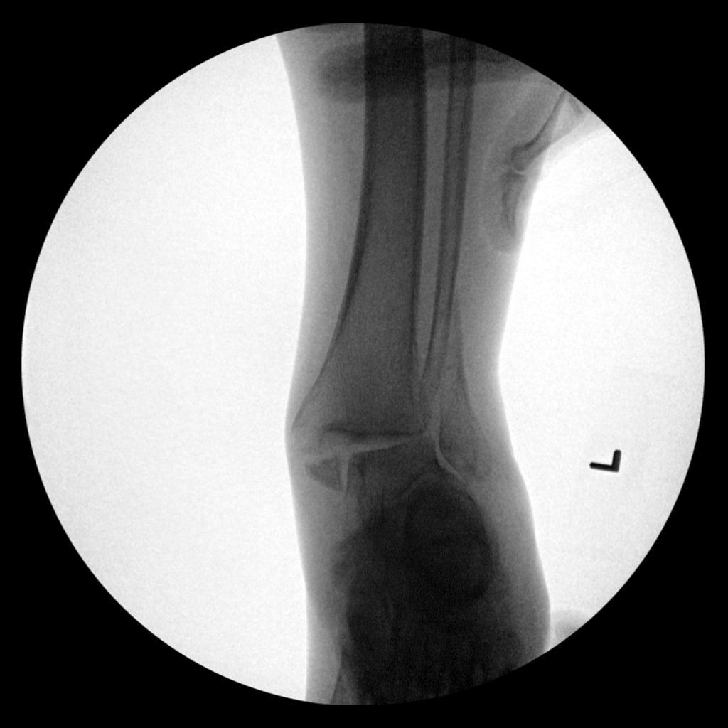
[im 2/8]
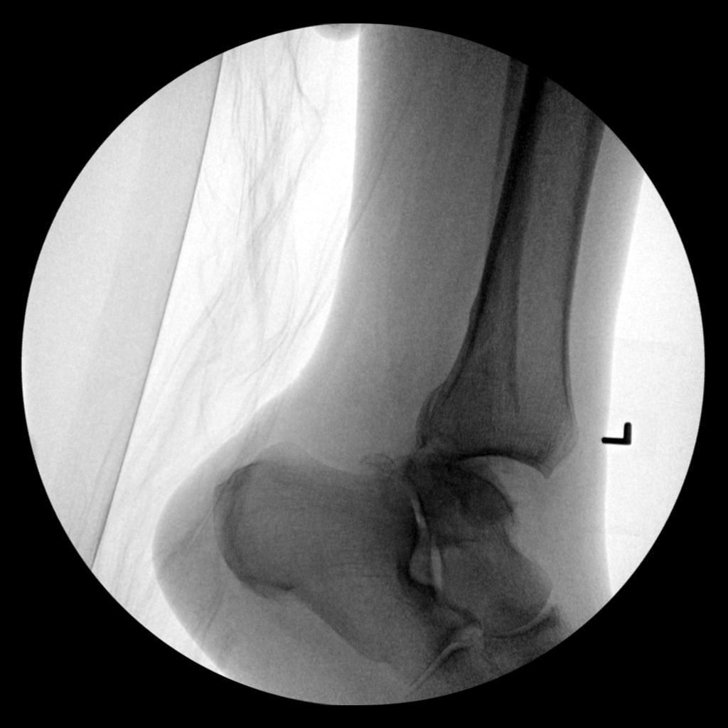
[im 3/8]
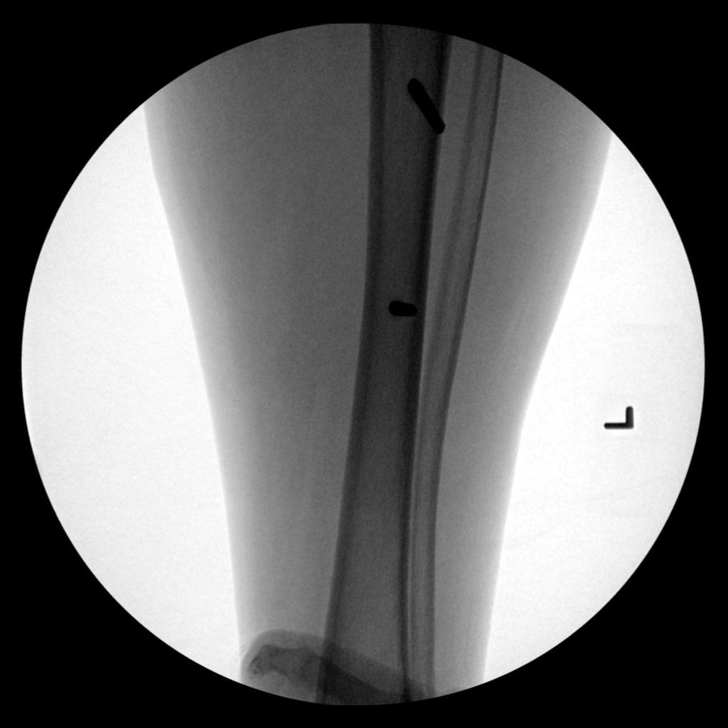
[im 4/8]
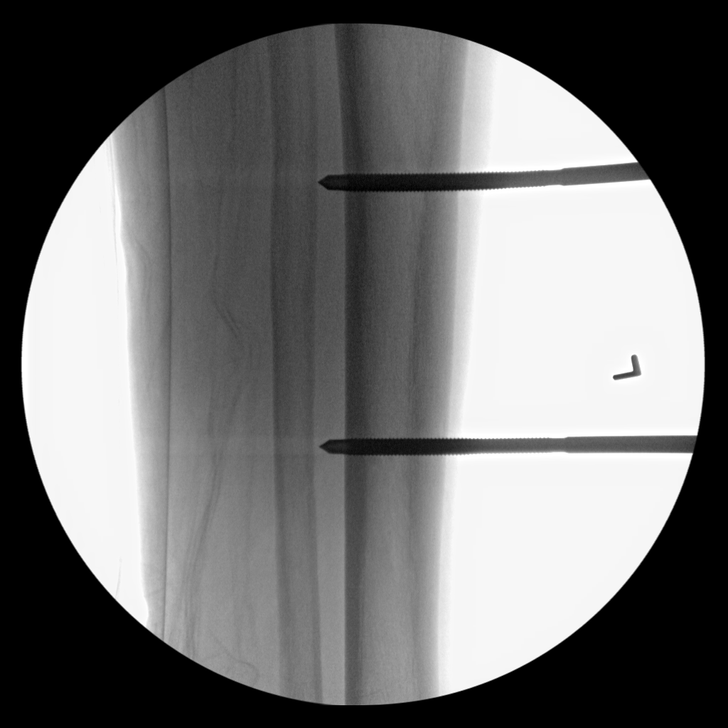
[im 5/8]
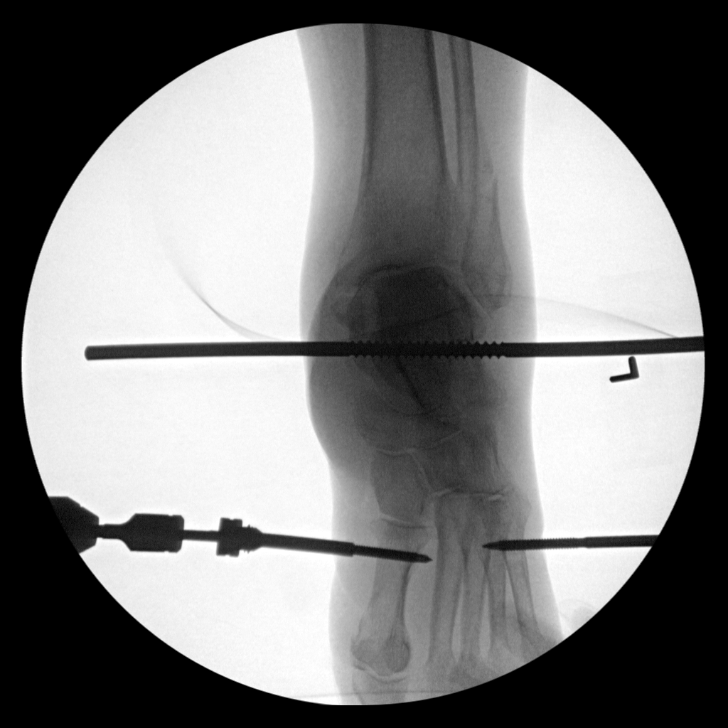
[im 6/8]
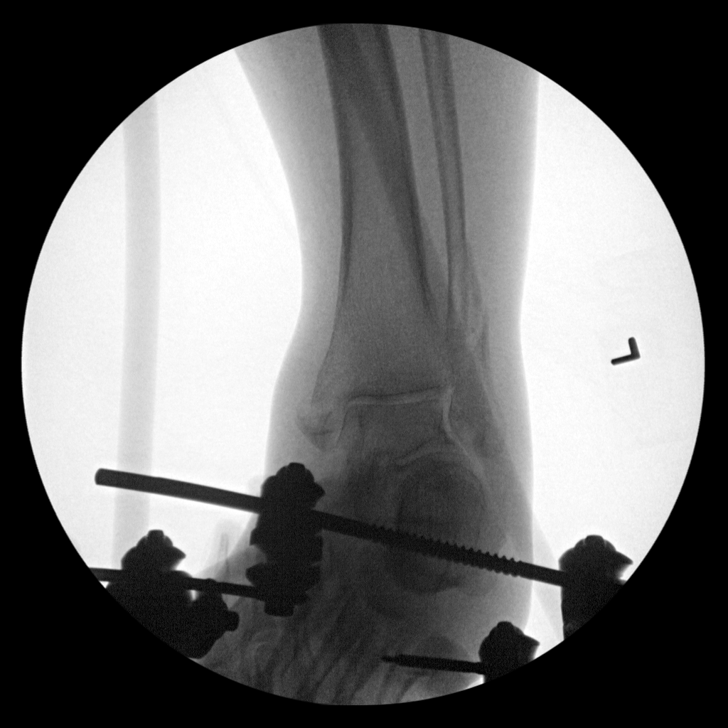
[im 7/8]
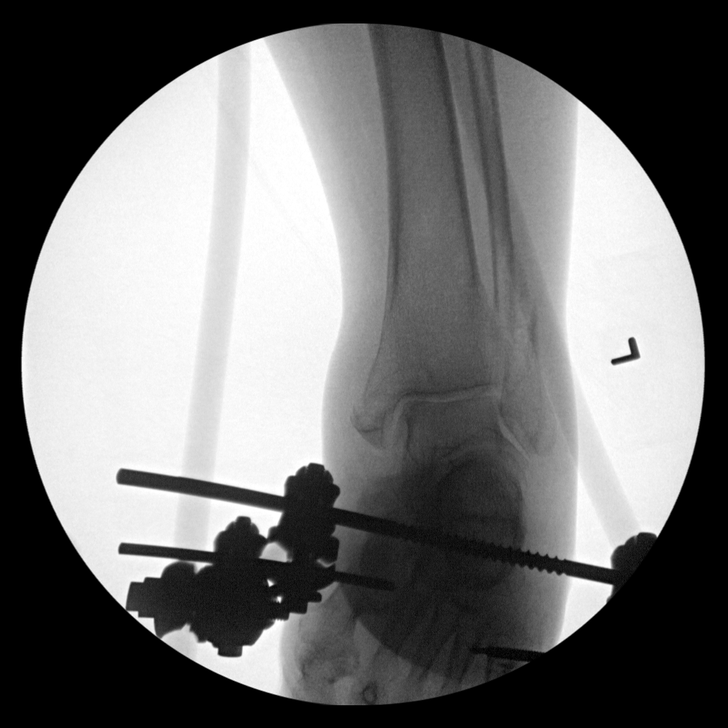
[im 8/8]
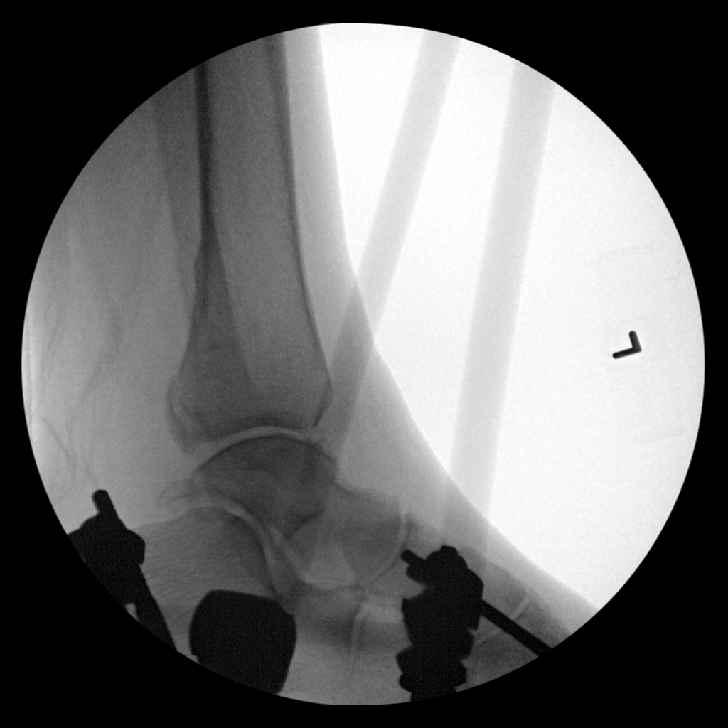

[8 of 8 positions shown; findings below may reference images not displayed]

FINDINGS: Multiple intraoperative spot images demonstrate placement of
external fixator across the left ankle fracture. Final images
demonstrate near anatomic alignment.
IMPRESSION: External fixator placement.

## 2017-09-24 SURGERY — EXTERNAL FIXATION, LOWER EXTREMITY
Anesthesia: General | Site: Ankle | Laterality: Left

## 2017-09-24 MED ORDER — 0.9 % SODIUM CHLORIDE (POUR BTL) OPTIME
TOPICAL | Status: DC | PRN
  Administered 2017-09-24: 1000 mL

## 2017-09-24 MED ORDER — MIDAZOLAM HCL 5 MG/5ML IJ SOLN
INTRAMUSCULAR | Status: DC | PRN
  Administered 2017-09-24: 1 mg via INTRAVENOUS

## 2017-09-24 MED ORDER — ENOXAPARIN SODIUM 40 MG/0.4ML ~~LOC~~ SOLN
40.0000 mg | SUBCUTANEOUS | Status: DC
  Administered 2017-09-24 – 2017-09-29 (×5): 40 mg via SUBCUTANEOUS
  Filled 2017-09-24 (×5): qty 0.4

## 2017-09-24 MED ORDER — PHENYLEPHRINE 40 MCG/ML (10ML) SYRINGE FOR IV PUSH (FOR BLOOD PRESSURE SUPPORT)
PREFILLED_SYRINGE | INTRAVENOUS | Status: AC
  Filled 2017-09-24: qty 10

## 2017-09-24 MED ORDER — OXYCODONE-ACETAMINOPHEN 5-325 MG PO TABS
2.0000 | ORAL_TABLET | ORAL | Status: DC | PRN
  Administered 2017-09-26 – 2017-09-30 (×7): 2 via ORAL
  Filled 2017-09-24 (×9): qty 2

## 2017-09-24 MED ORDER — CLINDAMYCIN PHOSPHATE 900 MG/50ML IV SOLN
900.0000 mg | INTRAVENOUS | Status: AC
  Administered 2017-09-24: 900 mg via INTRAVENOUS
  Filled 2017-09-24: qty 50

## 2017-09-24 MED ORDER — VANCOMYCIN HCL 1000 MG IV SOLR
INTRAVENOUS | Status: AC
  Filled 2017-09-24: qty 1000

## 2017-09-24 MED ORDER — SCOPOLAMINE 1 MG/3DAYS TD PT72
MEDICATED_PATCH | TRANSDERMAL | Status: AC
  Filled 2017-09-24: qty 1

## 2017-09-24 MED ORDER — PHENYLEPHRINE 40 MCG/ML (10ML) SYRINGE FOR IV PUSH (FOR BLOOD PRESSURE SUPPORT)
PREFILLED_SYRINGE | INTRAVENOUS | Status: DC | PRN
  Administered 2017-09-24 (×2): 120 ug via INTRAVENOUS

## 2017-09-24 MED ORDER — DIPHENHYDRAMINE HCL 50 MG/ML IJ SOLN
INTRAMUSCULAR | Status: DC | PRN
  Administered 2017-09-24: 25 mg via INTRAVENOUS

## 2017-09-24 MED ORDER — PHENYLEPHRINE HCL 10 MG/ML IJ SOLN
INTRAMUSCULAR | Status: DC | PRN
  Administered 2017-09-24: 50 ug/min via INTRAVENOUS

## 2017-09-24 MED ORDER — DEXAMETHASONE SODIUM PHOSPHATE 10 MG/ML IJ SOLN
INTRAMUSCULAR | Status: AC
  Filled 2017-09-24: qty 1

## 2017-09-24 MED ORDER — DEXAMETHASONE SODIUM PHOSPHATE 10 MG/ML IJ SOLN
INTRAMUSCULAR | Status: DC | PRN
  Administered 2017-09-24: 10 mg via INTRAVENOUS

## 2017-09-24 MED ORDER — LACTATED RINGERS IV SOLN
INTRAVENOUS | Status: DC | PRN
  Administered 2017-09-24: 07:00:00 via INTRAVENOUS

## 2017-09-24 MED ORDER — FENTANYL CITRATE (PF) 100 MCG/2ML IJ SOLN
INTRAMUSCULAR | Status: DC | PRN
  Administered 2017-09-24: 50 ug via INTRAVENOUS

## 2017-09-24 MED ORDER — BUPIVACAINE-EPINEPHRINE (PF) 0.5% -1:200000 IJ SOLN
INTRAMUSCULAR | Status: DC | PRN
  Administered 2017-09-24: 15 mL via PERINEURAL
  Administered 2017-09-24: 25 mL via PERINEURAL

## 2017-09-24 MED ORDER — HYDROMORPHONE HCL 1 MG/ML IJ SOLN: 0.2500 mg | INTRAMUSCULAR | Status: DC | PRN

## 2017-09-24 MED ORDER — ONDANSETRON HCL 4 MG/2ML IJ SOLN
INTRAMUSCULAR | Status: AC
  Filled 2017-09-24: qty 2

## 2017-09-24 MED ORDER — SCOPOLAMINE 1 MG/3DAYS TD PT72
MEDICATED_PATCH | TRANSDERMAL | Status: DC | PRN
  Administered 2017-09-24: 1 via TRANSDERMAL

## 2017-09-24 MED ORDER — PROPOFOL 10 MG/ML IV BOLUS
INTRAVENOUS | Status: DC | PRN
Start: 2017-09-24 — End: 2017-09-24
  Administered 2017-09-24: 50 mg via INTRAVENOUS
  Administered 2017-09-24: 150 mg via INTRAVENOUS

## 2017-09-24 MED ORDER — PROPOFOL 10 MG/ML IV BOLUS
INTRAVENOUS | Status: AC
  Filled 2017-09-24: qty 40

## 2017-09-24 MED ORDER — ARTIFICIAL TEARS OPHTHALMIC OINT
TOPICAL_OINTMENT | OPHTHALMIC | Status: AC
  Filled 2017-09-24: qty 3.5

## 2017-09-24 MED ORDER — MIDAZOLAM HCL 2 MG/2ML IJ SOLN
INTRAMUSCULAR | Status: AC
  Filled 2017-09-24: qty 2

## 2017-09-24 MED ORDER — OXYCODONE-ACETAMINOPHEN 5-325 MG PO TABS
1.0000 | ORAL_TABLET | ORAL | Status: DC | PRN
  Administered 2017-09-25: 1 via ORAL
  Administered 2017-09-25: 0.5 via ORAL
  Administered 2017-09-26 – 2017-09-30 (×5): 1 via ORAL
  Filled 2017-09-24 (×7): qty 1

## 2017-09-24 MED ORDER — DIPHENHYDRAMINE HCL 50 MG/ML IJ SOLN
INTRAMUSCULAR | Status: AC
  Filled 2017-09-24: qty 1

## 2017-09-24 MED ORDER — FENTANYL CITRATE (PF) 250 MCG/5ML IJ SOLN
INTRAMUSCULAR | Status: AC
  Filled 2017-09-24: qty 5

## 2017-09-24 MED ORDER — PROMETHAZINE HCL 25 MG/ML IJ SOLN: 6.2500 mg | INTRAMUSCULAR | Status: DC | PRN

## 2017-09-24 SURGICAL SUPPLY — 50 items
BANDAGE ACE 4X5 VEL STRL LF (GAUZE/BANDAGES/DRESSINGS) ×2 IMPLANT
BANDAGE ACE 6X5 VEL STRL LF (GAUZE/BANDAGES/DRESSINGS) ×1 IMPLANT
BAR EXFX 220X4 CNCT MRI (Rod) ×1 IMPLANT
BIT DRILL QC 3.5X195 (BIT) ×1 IMPLANT
BNDG COHESIVE 4X5 TAN STRL (GAUZE/BANDAGES/DRESSINGS) ×2 IMPLANT
BNDG GAUZE ELAST 4 BULKY (GAUZE/BANDAGES/DRESSINGS) ×3 IMPLANT
BRUSH SCRUB SURG 4.25 DISP (MISCELLANEOUS) ×4 IMPLANT
CHLORAPREP W/TINT 26ML (MISCELLANEOUS) ×2 IMPLANT
CLAMP COMBI 8.0/11.0 LRG/MED (Clamp) ×1 IMPLANT
CLAMP COMBO MED CLIP-ON SELF (Clamp) ×4 IMPLANT
CLAMP LG MULTI PIN (Clamp) ×1 IMPLANT
CLAMP ROD ATTACHMENT (Clamp) ×1 IMPLANT
COVER SURGICAL LIGHT HANDLE (MISCELLANEOUS) ×3 IMPLANT
DRAPE C-ARM 42X72 X-RAY (DRAPES) IMPLANT
DRAPE C-ARMOR (DRAPES) ×2 IMPLANT
DRAPE IMP U-DRAPE 54X76 (DRAPES) ×4 IMPLANT
DRAPE ORTHO SPLIT 77X108 STRL (DRAPES) ×4
DRAPE SURG ORHT 6 SPLT 77X108 (DRAPES) ×2 IMPLANT
DRAPE U-SHAPE 47X51 STRL (DRAPES) ×2 IMPLANT
DRSG MEPITEL 4X7.2 (GAUZE/BANDAGES/DRESSINGS) IMPLANT
ELECT REM PT RETURN 9FT ADLT (ELECTROSURGICAL) ×2
ELECTRODE REM PT RTRN 9FT ADLT (ELECTROSURGICAL) ×1 IMPLANT
GAUZE SPONGE 4X4 12PLY STRL (GAUZE/BANDAGES/DRESSINGS) ×2 IMPLANT
GLOVE BIO SURGEON STRL SZ7.5 (GLOVE) ×8 IMPLANT
GLOVE BIOGEL PI IND STRL 7.5 (GLOVE) ×1 IMPLANT
GLOVE BIOGEL PI INDICATOR 7.5 (GLOVE) ×1
GOWN STRL REUS W/ TWL LRG LVL3 (GOWN DISPOSABLE) ×2 IMPLANT
GOWN STRL REUS W/TWL LRG LVL3 (GOWN DISPOSABLE) ×4
KIT BASIN OR (CUSTOM PROCEDURE TRAY) ×2 IMPLANT
KIT ROOM TURNOVER OR (KITS) ×2 IMPLANT
MANIFOLD NEPTUNE II (INSTRUMENTS) ×2 IMPLANT
NEEDLE 22X1 1/2 (OR ONLY) (NEEDLE) IMPLANT
NS IRRIG 1000ML POUR BTL (IV SOLUTION) ×2 IMPLANT
PACK ORTHO EXTREMITY (CUSTOM PROCEDURE TRAY) ×2 IMPLANT
PAD ARMBOARD 7.5X6 YLW CONV (MISCELLANEOUS) ×4 IMPLANT
PADDING CAST COTTON 6X4 STRL (CAST SUPPLIES) ×4 IMPLANT
PIN SHANZ TRANSFIX 6.0X225 (PIN) ×1 IMPLANT
ROD CARBON FIBER 8.0X160MM (Rod) ×1 IMPLANT
ROD CRBN FBR 8.0X120 (Rod) ×1 IMPLANT
ROD CRBN FBR LRG EX-FX 11X350 (Rod) ×2 IMPLANT
SCREW SCHNZ SD 5.0 60 THRD/150 (Screw) IMPLANT
SCREW SHANZ 4.0X100 (Screw) ×1 IMPLANT
SCRW SCHANZ SD 5.0 60 THRD/150 (Screw) ×4 IMPLANT
SPONGE LAP 18X18 X RAY DECT (DISPOSABLE) ×2 IMPLANT
STAPLER VISISTAT 35W (STAPLE) ×2 IMPLANT
STRIP CLOSURE SKIN 1/2X4 (GAUZE/BANDAGES/DRESSINGS) IMPLANT
SUT PROLENE 0 CT (SUTURE) IMPLANT
TOWEL OR 17X24 6PK STRL BLUE (TOWEL DISPOSABLE) ×4 IMPLANT
TOWEL OR 17X26 10 PK STRL BLUE (TOWEL DISPOSABLE) ×4 IMPLANT
UNDERPAD 30X30 (UNDERPADS AND DIAPERS) ×2 IMPLANT

## 2017-09-24 NOTE — Anesthesia Procedure Notes (Signed)
Procedure Name: LMA Insertion Date/Time: 09/24/2017 7:35 AM Performed by: Valda Favia, CRNA Pre-anesthesia Checklist: Patient identified, Emergency Drugs available, Suction available, Patient being monitored and Timeout performed Patient Re-evaluated:Patient Re-evaluated prior to induction Oxygen Delivery Method: Circle system utilized Preoxygenation: Pre-oxygenation with 100% oxygen Induction Type: IV induction LMA: LMA inserted LMA Size: 4.0 Number of attempts: 1 Placement Confirmation: positive ETCO2 and breath sounds checked- equal and bilateral Tube secured with: Tape Dental Injury: Teeth and Oropharynx as per pre-operative assessment

## 2017-09-24 NOTE — Progress Notes (Signed)
OT Cancellation Note  Patient Details Name: Destiny [name] MRN: [id number] DOB: May 03, 1946   Cancelled Treatment:    Reason Eval/Treat Not Completed: Patient at procedure or test/ unavailable. Pt currently in surgery.  Almon Register [phone] 09/24/2017, 7:52 AM

## 2017-09-24 NOTE — Transfer of Care (Signed)
Immediate Anesthesia Transfer of Care Note  Patient: Destiny [name]  Procedure(s) Performed: EXTERNAL FIXATION ankle (Left Ankle)  Patient Location: PACU  Anesthesia Type:GA combined with regional for post-op pain  Level of Consciousness: awake and alert   Airway & Oxygen Therapy: Patient Spontanous Breathing and Patient connected to nasal cannula oxygen  Post-op Assessment: Report given to RN and Post -op Vital signs reviewed and stable  Post vital signs: Reviewed and stable  Last Vitals:  Vitals:   09/24/17 0720 09/24/17 0837  BP:    Pulse: 72 83  Resp: 13   Temp:  36.8 C  SpO2: 93%     Last Pain:  Vitals:   09/24/17 0321  TempSrc: Oral  PainSc:       Patients Stated Pain Goal: 3 (62/94/76 [id number])  Complications: No apparent anesthesia complications

## 2017-09-24 NOTE — Op Note (Signed)
[facility]edic[facility] ([id number]) Date of Surgery: 09/24/2017  Admit Date: 09/23/2017   Diagnoses: Pre-Op Diagnoses: Left trimalleolar ankle fracture dislocation  Post-Op Diagnosis: Same  Procedures: 1. CPT 20692-External fixation of left ankle 2. CPT 27818-Closed reduction of left trimalleolar ankle fracture  Surgeons: Primary: Boyce Keltner, Thomasene Lot, MD   Location:[facility] OR ROOM 12   Anesthesia: Regional and general   Antibiotics:Clindamycin 900mg   Tourniquettime:None  [id number]:57 mL   Complications:None  Specimens:None  Implants: Implant Name Type Inv. Item Serial No. Manufacturer Lot No. LRB No. Used Action  SCREW SHANZ [id number] - [id number] Screw SCREW SHANZ [id number]  [employer]  Left 1 Implanted    IndicationsforSurgery: 71 year old female with osteoporosis and type 2 diabetes.  She sustained a ground-level fall with a left trimalleolar ankle fracture dislocation.  She was brought to the emergency room where a reduction was attempted.  She was unable to mobilize and thus we were contacted for admission.  I felt that with her unstable nature of her fracture as well as potential swelling that she would be best served with an external fixation with delayed ORIF.  I discussed the risks and benefits with the patient and her son.  They agreed to proceed with surgery.  As is Risks discussed included bleeding requiring blood transfusion, bleeding causing a hematoma, infection, malunion, nonunion, damage to surrounding nerves and blood vessels, pain, hardware prominence or irritation, hardware failure, stiffness, post-traumatic arthritis, DVT/PE, compartment syndrome, and even death. Risks and benefits were extensively discussed as noted above and the patient and their family agreed to proceed with surgery and consent was obtained.  Operative Findings: 1. Unstable left trimalleolar ankle fracture s/p closed reduction and external fixation of ankle  using Synthes large and medium external fixation  Procedure: The patient was identified in the preoperative holding area. Consent was confirmed with the patient and their family and all questions were answered. The operative extremity was marked after confirmation with the patient. They were then brought back to the operating room by our anesthesia colleagues. They were carefully transferred over to a radiolucent flat top table and placed under general anesthesia.The splint was cut down and there was significant swelling and I felt that an incision would be inappropriate at this point and that external fixation would be most appropriate as how unstable his ankle joint was. The operative extremity was then prepped and draped in usual sterile fashion. A preoperative timeout was performed to verify the patient, the procedure, and the extremity. Preoperative antibiotics were dosed.  Using a 6-hole pin clamp as a guide I made two percutaneous incisions and predrilled the tibia and placed 5.2mm threaded half pins, gaining excellent purchase. A lateral fluoroscopic view was obtained to confirm adequate length through the far cortex. A small incision was made over the medial calcaneus a 6.52mm calcaneal transfixation pin was placed through the heel and lateral fluoro was used to confirm placement. Fluoroscopy was then used to visualize the base of the 1st and 5th metatarsals and a percutaneous incision was made over each. A 2.7mm drill bit was used to predrill both cortices of each bone and a 4.67mm pin was placed. Pin to bar clamps were placed and a medium ex-fix bar was placed connecting the metatarsal pins to the transfixion pin in the calcaneus on both the medial and lateral side. Large bars were used to connect the tibial pin clamp to the medium bars and a reduction maneuver was performed.   The talus was reduced underneath the plafond and all  of the clamps were tightened. A lateral view was obtained to show  adequate reduction of the talus and the clamps were final tightened. The pin sites were dressed with kerlix and the drapes were broken down. The patient was then awoken from anesthesia and taken to the PACU in stable condition.  Post Op Plan/Instructions: The patient will be nonweightbearing to the left leg. We will keep her elevated and attempted to perform her ORIF during this hospitalization. She will mobilize with physical and occupational therapy. She will be on lovenox for VTE prophylaxis.  I was present and performed the entire surgery.  Katha Hamming, MD [facility]

## 2017-09-24 NOTE — Anesthesia Procedure Notes (Signed)
Anesthesia Regional Block: Popliteal block   Pre-Anesthetic Checklist: ,, timeout performed, Correct Patient, Correct Site, Correct Laterality, Correct Procedure, Correct Position, site marked, Risks and benefits discussed,  Surgical consent,  Pre-op evaluation,  At surgeon's request and post-op pain management  Laterality: Left  Prep: chloraprep       Needles:  Injection technique: Single-shot  Needle Type: Echogenic Stimulator Needle          Additional Needles:   Narrative:  Start time: 09/24/2017 7:01 AM End time: 09/24/2017 7:14 AM Injection made incrementally with aspirations every 5 mL.  Performed by: Personally  Anesthesiologist: Duane Boston, MD  Additional Notes: A functioning IV was confirmed and monitors were applied.  Sterile prep and drape, hand hygiene and sterile gloves were used.  Negative aspiration and test dose prior to incremental administration of local anesthetic. The patient tolerated the procedure well.Ultrasound  guidance: relevant anatomy identified, needle position confirmed, local anesthetic spread visualized around nerve(s), vascular puncture avoided.  Image printed for medical record. ADC supplementation.

## 2017-09-24 NOTE — Anesthesia Postprocedure Evaluation (Signed)
Anesthesia Post Note  Patient: Destiny [name]  Procedure(s) Performed: EXTERNAL FIXATION ankle (Left Ankle)     Patient location during evaluation: PACU Anesthesia Type: General Level of consciousness: sedated Pain management: pain level controlled Vital Signs Assessment: post-procedure vital signs reviewed and stable Respiratory status: spontaneous breathing and respiratory function stable Cardiovascular status: stable Postop Assessment: no apparent nausea or vomiting Anesthetic complications: no    Last Vitals:  Vitals:   09/24/17 0926 09/24/17 0928  BP:  111/71  Pulse: 73 73  Resp: 13 12  Temp: 37 C   SpO2: 95% 95%    Last Pain:  Vitals:   09/24/17 0926  TempSrc:   PainSc: 0-No pain    LLE Motor Response: No movement due to regional block (09/24/17 0926) LLE Sensation: Decreased (09/24/17 0926)          Duane Boston DANIEL

## 2017-09-25 ENCOUNTER — Encounter (HOSPITAL_COMMUNITY): Payer: Self-pay | Admitting: Student

## 2017-09-25 LAB — GLUCOSE, CAPILLARY
GLUCOSE-CAPILLARY: 138 mg/dL — AB (ref 65–99)
GLUCOSE-CAPILLARY: 109 mg/dL — AB (ref 65–99)
GLUCOSE-CAPILLARY: 144 mg/dL — AB (ref 65–99)
Glucose-Capillary: 143 mg/dL — ABNORMAL HIGH (ref 65–99)

## 2017-09-25 LAB — VITAMIN D 25 HYDROXY (VIT D DEFICIENCY, FRACTURES): Vit D, 25-Hydroxy: 30.4 ng/mL (ref 30.0–100.0)

## 2017-09-25 LAB — CALCIUM, IONIZED: CALCIUM, IONIZED, SERUM: 4.5 mg/dL (ref 4.5–5.6)

## 2017-09-25 LAB — CALCITRIOL (1,25 DI-OH VIT D): VIT D 1 25 DIHYDROXY: 40.1 pg/mL (ref 19.9–79.3)

## 2017-09-25 MED ORDER — VITAMIN D 1000 UNITS PO TABS
1000.0000 [IU] | ORAL_TABLET | Freq: Every day | ORAL | Status: DC
  Administered 2017-09-25 – 2017-09-30 (×6): 1000 [IU] via ORAL
  Filled 2017-09-25 (×6): qty 1

## 2017-09-25 MED ORDER — METFORMIN HCL ER 500 MG PO TB24
1000.0000 mg | ORAL_TABLET | Freq: Two times a day (BID) | ORAL | Status: DC
Start: 2017-09-25 — End: 2017-09-30
  Administered 2017-09-25 – 2017-09-30 (×9): 1000 mg via ORAL
  Filled 2017-09-25 (×9): qty 2

## 2017-09-25 MED ORDER — PROSIGHT PO TABS
1.0000 | ORAL_TABLET | Freq: Every day | ORAL | Status: DC
  Administered 2017-09-25 – 2017-09-30 (×6): 1 via ORAL
  Filled 2017-09-25 (×6): qty 1

## 2017-09-25 NOTE — Evaluation (Signed)
Physical Therapy Evaluation Patient Details Name: Destiny [name] MRN: [id number] DOB: [birth year] Today's Date: 09/25/2017   History of Present Illness  ARDYN FORGE is an 71 y.o. white female with history of diabetes, osteoporosis, thyroid disease who sustained a ground-level fall earlier this morning resulting in an injury to her left ankle. Pt s/p Closed reduction of left trimalleolar ankle fracture and External fixation of left ankle. She is for ORIF of left ankle next week.    Clinical Impression  Pt admitted with above diagnosis. Pt currently with functional limitations due to the deficits listed below (see PT Problem List). PTA, patient is independent. Upon eval, pt presenting with limitations in ankle strength due to pain and is currently NWB status in preparation for surgery scheduled next week.  Educated patient on safety and practiced OOB mobility with transfers, patient currently min A x2 to protect ex fix at this time. Patient impulsive with motion but states she understands precautions at ankle. Pt will benefit from skilled PT to increase their independence and safety with mobility to allow discharge to the venue listed below.       Follow Up Recommendations SNF;DC plan and follow up therapy as arranged by surgeon;Supervision/Assistance - 24 hour    Equipment Recommendations  None recommended by PT    Recommendations for Other Services       Precautions / Restrictions Precautions Precautions: Fall Precaution Comments: external fixator Restrictions Weight Bearing Restrictions: Yes LLE Weight Bearing: Non weight bearing      Mobility  Bed Mobility Overal bed mobility: Needs Assistance Bed Mobility: Supine to Sit     Supine to sit: Min assist;HOB elevated     General bed mobility comments:  A for LLE  Transfers Overall transfer level: Needs assistance Equipment used: Rolling walker (2 wheeled) Transfers: Sit to/from [facility] Sit to  Stand: Min assist;+2 safety/equipment Stand pivot transfers: +2 safety/equipment;Min assist       General transfer comment: +2 needed to watch LLE so she did not put weight on it  Ambulation/Gait                Stairs            Wheelchair Mobility    Modified Rankin (Stroke Patients Only)       Balance Overall balance assessment: Needs assistance Sitting-balance support: Feet supported;No upper extremity supported Sitting balance-Leahy Scale: Good     Standing balance support: Bilateral upper extremity supported Standing balance-Leahy Scale: Poor Standing balance comment: Needs hands on RW at this time for balance due to [place] on LLE                             Pertinent Vitals/Pain Pain Assessment: Faces Faces Pain Scale: Hurts a little bit Pain Location: LLE (starting to feel it, block wearing off) Pain Descriptors / Indicators: Aching;Sore    Home Living Family/patient expects to be discharged to:: Skilled nursing facility Living Arrangements: Alone                    Prior Function Level of Independence: Independent               Hand Dominance   Dominant Hand: Right    Extremity/Trunk Assessment   Upper Extremity Assessment Upper Extremity Assessment: Overall WFL for tasks assessed    Lower Extremity Assessment Lower Extremity Assessment: Overall WFL for tasks assessed;LLE deficits/detail(LLE leg ankle strength limited by  ex fix)       Communication   Communication: No difficulties  Cognition Arousal/Alertness: Awake/alert Behavior During Therapy: Impulsive Overall Cognitive Status: Within Functional Limits for tasks assessed                                        General Comments General comments (skin integrity, edema, etc.): Discussion with patient and family over precautions and plan of care with rehab in light of scheduled surgery on monday.     Exercises General Exercises - Lower  Extremity Ankle Circles/Pumps: AROM;Right;15 reps Hip ABduction/ADduction: AROM;Both;10 reps Straight Leg Raises: AROM;Right;Both;10 reps   Assessment/Plan    PT Assessment Patient needs continued PT services  PT Problem List Decreased strength;Decreased range of motion;Decreased activity tolerance;Decreased balance;Decreased mobility;Decreased knowledge of use of DME;Decreased safety awareness;Pain       PT Treatment Interventions DME instruction;Gait training;Stair training;Functional mobility training;Therapeutic activities;Therapeutic exercise    PT Goals (Current goals can be found in the Care Plan section)  Acute Rehab PT Goals Patient Stated Goal: to go to rehab then home PT Goal Formulation: With patient/family Time For Goal Achievement: 10/09/17 Potential to Achieve Goals: Good    Frequency Min 5X/week   Barriers to discharge Decreased caregiver support lives alone    Co-evaluation PT/OT/SLP Co-Evaluation/Treatment: Yes Reason for Co-Treatment: For patient/therapist safety PT goals addressed during session: Mobility/safety with mobility;Balance;Proper use of DME;Strengthening/ROM         AM-PAC PT "6 Clicks" Daily Activity  Outcome Measure Difficulty turning over in bed (including adjusting bedclothes, sheets and blankets)?: Unable Difficulty moving from lying on back to sitting on the side of the bed? : Unable Difficulty sitting down on and standing up from a chair with arms (e.g., wheelchair, bedside commode, etc,.)?: Unable Help needed moving to and from a bed to chair (including a wheelchair)?: A Little Help needed walking in hospital room?: A Little Help needed climbing 3-5 steps with a railing? : A Lot 6 Click Score: 11    End of Session Equipment Utilized During Treatment: Gait belt Activity Tolerance: Patient tolerated treatment well Patient left: in chair;with call bell/phone within reach;with family/visitor present Nurse Communication: Mobility  status PT Visit Diagnosis: Unsteadiness on feet (R26.81);Other abnormalities of gait and mobility (R26.89);Pain Pain - Right/Left: Left Pain - part of body: Leg    Time: 9485-4627 PT Time Calculation (min) (ACUTE ONLY): 30 min   Charges:   PT Evaluation $PT Eval Low Complexity: 1 Low PT Treatments $Therapeutic Activity: 8-22 mins   PT G Codes:        Reinaldo Berber, PT, DPT Acute Rehab Services Pager: [phone]    Reinaldo Berber 09/25/2017, 6:59 PM

## 2017-09-25 NOTE — NC FL2 (Signed)
[place] MEDICAID FL2 LEVEL OF CARE SCREENING TOOL     IDENTIFICATION  Patient Name: Destiny [name] Birthdate: 1946-04-23 Sex: female Admission Date (Current Location): 09/23/2017  [facility] and [place] Number:  Herbalist and Address:  [facility] [place], [place] [address] [place], [place] [address]      Provider Number: [id number]  Attending Physician Name and Address:  Shona Needles, MD  Relative Name and Phone Number:  Destiny [name], [phone]    Current Level of Care: Hospital Recommended Level of Care: [place] Prior Approval Number:    Date Approved/Denied:   PASRR Number: [id number]  Discharge Plan: SNF    Current Diagnoses: Patient Active Problem List   Diagnosis Date Noted  . Trimalleolar fracture of ankle, closed, left, initial encounter 09/24/2017  . Tibia/fibula fracture 09/23/2017  . Closed left ankle fracture 09/23/2017  . Ankle dislocation, left, initial encounter 09/23/2017  . Thyroid disease   . Sleep apnea   . Osteoporosis   . Type 2 diabetes mellitus ([place])   . Oth intartic fracture of lower end of right radius, init 07/29/2016  . Anal and rectal polyp 12/16/2013    Orientation RESPIRATION BLADDER Height & Weight     Self, Time, Situation, Place  Normal External catheter Weight: 254 lb (115.2 kg) Height:  5\' 3"  (160 cm)  BEHAVIORAL SYMPTOMS/MOOD NEUROLOGICAL BOWEL NUTRITION STATUS      Continent Diet  AMBULATORY STATUS COMMUNICATION OF NEEDS Skin   Extensive Assist Verbally Surgical wounds(L Ankle )                       Personal Care Assistance Level of Assistance  Bathing, Feeding, Dressing Bathing Assistance: Maximum assistance Feeding assistance: Independent Dressing Assistance: Maximum assistance     Functional Limitations Info  Sight, Hearing, Speech Sight Info: Adequate(wears glasses) Hearing Info: Adequate Speech Info: Adequate    SPECIAL CARE FACTORS FREQUENCY  PT (By  licensed PT), OT (By licensed OT)     PT Frequency: 5x week OT Frequency: 5x week            Contractures Contractures Info: Not present    Additional Factors Info  Code Status, Allergies Code Status Info: Full Code Allergies Info: Penicillins           Current Medications (09/25/2017):  This is the current hospital active medication list Current Facility-Administered Medications  Medication Dose Route Frequency Provider Last Rate Last Dose  . cholecalciferol (VITAMIN D) tablet 1,000 Units  1,000 Units Oral Daily Haddix, Thomasene Lot, MD   1,000 Units at 09/25/17 1627  . docusate sodium (COLACE) capsule 100 mg  100 mg Oral BID Ainsley Spinner, PA-C   100 mg at 09/25/17 2032  . enoxaparin (LOVENOX) injection 40 mg  40 mg Subcutaneous Q24H Haddix, Thomasene Lot, MD   40 mg at 09/25/17 2032  . insulin aspart (novoLOG) injection 0-20 Units  0-20 Units Subcutaneous TID WC Ainsley Spinner, PA-C   2 Units at 09/24/17 1739  . levothyroxine (SYNTHROID, LEVOTHROID) tablet 137 mcg  137 mcg Oral QAC breakfast Ainsley Spinner, PA-C   137 mcg at 09/25/17 7564  . metFORMIN (GLUCOPHAGE-XR) 24 hr tablet 1,000 mg  1,000 mg Oral BID WC Haddix, Thomasene Lot, MD   1,000 mg at 09/25/17 1627  . morphine 4 MG/ML injection 1-2 mg  1-2 mg Intravenous Q2H PRN Haddix, Thomasene Lot, MD   1 mg at 09/25/17 2025  . multivitamin (PROSIGHT)  tablet 1 tablet  1 tablet Oral Daily Haddix, Thomasene Lot, MD   1 tablet at 09/25/17 1626  . oxyCODONE-acetaminophen (PERCOCET/ROXICET) 5-325 MG per tablet 1 tablet  1 tablet Oral Q4H PRN Haddix, Thomasene Lot, MD   0.5 tablet at 09/25/17 1627  . oxyCODONE-acetaminophen (PERCOCET/ROXICET) 5-325 MG per tablet 2 tablet  2 tablet Oral Q4H PRN Haddix, Thomasene Lot, MD      . pravastatin (PRAVACHOL) tablet 20 mg  20 mg Oral Daily Ainsley Spinner, PA-C   20 mg at 09/25/17 2038  . propofol (DIPRIVAN) 10 mg/mL bolus/IV push 103 mg  1 mg/kg Intravenous Once Mackuen, Courteney Lyn, MD         Discharge Medications: Please see discharge  summary for a list of discharge medications.  Relevant Imaging Results:  Relevant Lab Results:   Additional Information SS# [place], [place]

## 2017-09-25 NOTE — Plan of Care (Signed)
  Education: Knowledge of General Education information will improve 09/25/2017 0106 - Progressing by Anson Fret, RN Note POC reviewed with pt.
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
## 2017-09-25 NOTE — Evaluation (Signed)
Occupational Therapy Evaluation Patient Details Name: Destiny [name] MRN: [id number] DOB: Jan 09, 1946 Today's Date: 09/25/2017    History of Present Illness Destiny [name] is an 71 y.o. white female with history of diabetes, osteoporosis, thyroid disease who sustained a ground-level fall earlier this morning resulting in an injury to her left ankle. Pt s/p Closed reduction of left trimalleolar ankle fracture and External fixation of left ankle. She is for ORIF of left ankle next week.   Clinical Impression   This 71 yo female admitted and underwent above presents to acute OT with decreased balance, decreased WB'ing LLE, external fixator LLE, and increased pain all affecting her PLOF of being totally independent with all basic and IADLs. She will benefit from acute OT with follow up OT at SNF to get back to PLOF.     Follow Up Recommendations  SNF;Supervision/Assistance - 24 hour    Equipment Recommendations  Other (comment)(TBD at next venue)       Precautions / Restrictions Precautions Precautions: Fall Precaution Comments: external fixator Restrictions Weight Bearing Restrictions: Yes LLE Weight Bearing: Non weight bearing      Mobility Bed Mobility Overal bed mobility: Needs Assistance Bed Mobility: Supine to Sit     Supine to sit: Min assist;HOB elevated     General bed mobility comments:  A for LLE  Transfers Overall transfer level: Needs assistance Equipment used: Rolling walker (2 wheeled) Transfers: Sit to/from [facility] Sit to Stand: Min assist;+2 safety/equipment Stand pivot transfers: +2 safety/equipment;Min assist       General transfer comment: +2 needed to watch LLE so she did not put weight on it    Balance Overall balance assessment: Needs assistance Sitting-balance support: Feet supported;No upper extremity supported Sitting balance-Leahy Scale: Good     Standing balance support: Bilateral upper extremity  supported Standing balance-Leahy Scale: Poor Standing balance comment: Needs hands on RW at this time for balance due to [place] on LLE                           ADL either performed or assessed with clinical judgement   ADL Overall ADL's : Needs assistance/impaired Eating/Feeding: Independent;Sitting   Grooming: Set up;Sitting   Upper Body Bathing: Set up;Sitting   Lower Body Bathing: Minimal assistance;+2 for safety/equipment;Sit to/from stand Lower Body Bathing Details (indicate cue type and reason): one to watch LLE Upper Body Dressing : Set up;Sitting   Lower Body Dressing: Moderate assistance Lower Body Dressing Details (indicate cue type and reason): min A +2 safety sit to stand with one to watch LLE Toilet Transfer: Minimal assistance;+2 for safety/equipment;Stand-pivot Toilet Transfer Details (indicate cue type and reason): bed>recliner going to her right side Toileting- Clothing Manipulation and Hygiene: Moderate assistance Toileting - Clothing Manipulation Details (indicate cue type and reason): min  A+2 for safety (with one of those to watch LLE)             Vision Patient Visual Report: No change from baseline              Pertinent Vitals/Pain Pain Assessment: Faces Faces Pain Scale: Hurts a little bit Pain Location: LLE (starting to feel it, block wearing off) Pain Descriptors / Indicators: Aching;Sore Pain Intervention(s): Limited activity within patient's tolerance;Monitored during session;Repositioned     Hand Dominance Right   Extremity/Trunk Assessment Upper Extremity Assessment Upper Extremity Assessment: Overall WFL for tasks assessed   Lower Extremity Assessment Lower Extremity Assessment: Defer to PT  evaluation       Communication Communication Communication: No difficulties   Cognition Arousal/Alertness: Awake/alert Behavior During Therapy: Impulsive Overall Cognitive Status: Within Functional Limits for tasks assessed                                                 Home Living Family/patient expects to be discharged to:: Skilled nursing facility Living Arrangements: Alone                                      Prior Functioning/Environment Level of Independence: Independent                 OT Problem List: Decreased range of motion;Impaired balance (sitting and/or standing);Pain      OT Treatment/Interventions: Self-care/ADL training;Balance training;DME and/or AE instruction;Patient/family education    OT Goals(Current goals can be found in the care plan section) Acute Rehab OT Goals Patient Stated Goal: to go to rehab then home OT Goal Formulation: With patient Time For Goal Achievement: 10/09/17 Potential to Achieve Goals: Good  OT Frequency: Min 2X/week   Barriers to D/C: Decreased caregiver support          Co-evaluation PT/OT/SLP Co-Evaluation/Treatment: Yes Reason for Co-Treatment: For patient/therapist safety   OT goals addressed during session: ADL's and self-care;Strengthening/ROM      AM-PAC PT "6 Clicks" Daily Activity     Outcome Measure Help from another person eating meals?: None Help from another person taking care of personal grooming?: A Little Help from another person toileting, which includes using toliet, bedpan, or urinal?: A Lot Help from another person bathing (including washing, rinsing, drying)?: A Lot Help from another person to put on and taking off regular upper body clothing?: A Little Help from another person to put on and taking off regular lower body clothing?: A Lot 6 Click Score: 16   End of Session Equipment Utilized During Treatment: Gait belt;Rolling walker Nurse Communication: Mobility status(IF pt needs A with picking up LLE pick up by external fixator on both sides)  Activity Tolerance: Patient tolerated treatment well Patient left: in chair;with call bell/phone within reach;with family/visitor  present  OT Visit Diagnosis: Other abnormalities of gait and mobility (R26.89);Unsteadiness on feet (R26.81) Pain - Right/Left: Left Pain - part of body: Ankle and joints of foot                Time: 1410-1432 OT Time Calculation (min): 22 min Charges:  OT General Charges $OT Visit: 1 Visit OT Evaluation $OT Eval Moderate Complexity: [address], [place] [phone] 09/25/2017

## 2017-09-26 LAB — GLUCOSE, CAPILLARY
GLUCOSE-CAPILLARY: 127 mg/dL — AB (ref 65–99)
GLUCOSE-CAPILLARY: 152 mg/dL — AB (ref 65–99)
Glucose-Capillary: 122 mg/dL — ABNORMAL HIGH (ref 65–99)
Glucose-Capillary: 122 mg/dL — ABNORMAL HIGH (ref 65–99)

## 2017-09-26 MED ORDER — PREGABALIN 75 MG PO CAPS
75.0000 mg | ORAL_CAPSULE | Freq: Two times a day (BID) | ORAL | Status: DC
  Administered 2017-09-26 – 2017-09-30 (×9): 75 mg via ORAL
  Filled 2017-09-26 (×9): qty 1

## 2017-09-26 MED ORDER — KETOROLAC TROMETHAMINE 15 MG/ML IJ SOLN
15.0000 mg | Freq: Four times a day (QID) | INTRAMUSCULAR | Status: DC
  Administered 2017-09-26 – 2017-09-30 (×15): 15 mg via INTRAVENOUS
  Filled 2017-09-26 (×15): qty 1

## 2017-09-26 NOTE — Progress Notes (Signed)
Orthopaedic Trauma Progress Note  S: Block wore off overnight and complaining of burning pain  O:  Vitals:   09/26/17 0249 09/26/17 0500  BP: 112/64 (!) 105/50  Pulse: 76 71  Resp: 18 18  Temp: 99.2 F (37.3 C) 98.2 F (36.8 C)  SpO2: 93% 94%   Gen: NAD, awake and alert LLE: Pin sites with minimal drainage, swelling appropriate, comaprtments soft, Sensation intact and motor function intact  Labs:  CBC    Component Value Date/Time   WBC 8.8 09/24/2017 1047   RBC 4.18 09/24/2017 1047   HGB 11.7 (L) 09/24/2017 1047   HCT 38.0 09/24/2017 1047   PLT 253 09/24/2017 1047   MCV 90.9 09/24/2017 1047   MCH 28.0 09/24/2017 1047   MCHC 30.8 09/24/2017 1047   RDW 14.9 09/24/2017 1047   LYMPHSABS 2.1 09/23/2017 1136   MONOABS 0.5 09/23/2017 1136   EOSABS 0.2 09/23/2017 1136   BASOSABS 0.0 09/23/2017 11319    A/P: 71 year old female with left ankle fracture dislocation s.p external fixation  -Recommend NWB -PT/OT -Will add toradol and lyrica to pain regimen -Plan for ORIF Monday -NPO past midnight Sunday  Shona Needles, MD [facility] [phone] (phone)

## 2017-09-27 LAB — GLUCOSE, CAPILLARY
GLUCOSE-CAPILLARY: 109 mg/dL — AB (ref 65–99)
GLUCOSE-CAPILLARY: 105 mg/dL — AB (ref 65–99)
GLUCOSE-CAPILLARY: 111 mg/dL — AB (ref 65–99)
Glucose-Capillary: 106 mg/dL — ABNORMAL HIGH (ref 65–99)

## 2017-09-27 NOTE — Plan of Care (Signed)
Pt resting quietly in bed, no acute issues overnight.

## 2017-09-27 NOTE — Progress Notes (Signed)
SPORTS MEDICINE AND JOINT REPLACEMENT  Lara Mulch, MD    Carlyon Shadow, PA-C [place] [address], [place], [place]  [address]                             [phone]   PROGRESS NOTE  Subjective:  negative for Chest Pain  negative for Shortness of Breath  negative for Nausea/Vomiting   negative for Calf Pain  negative for Bowel Movement   Tolerating Diet: yes         Patient reports pain as 3 on 0-10 scale.    Objective: Vital signs in last 24 hours:    Patient Vitals for the past 24 hrs:  BP Temp Temp src Pulse Resp SpO2  09/27/17 0427 102/68 98.3 F (36.8 C) Oral 66 16 92 %  09/26/17 2112 (!) 119/49 98.8 F (37.1 C) Axillary 71 18 90 %  09/26/17 1621 (!) 116/53 97.9 F (36.6 C) Oral 63 18 93 %    @flow {1959:LAST@   Intake/Output from previous day:   12/15 0701 - 12/16 0700 In: 240 [P.O.:240] Out: -    Intake/Output this shift:   No intake/output data recorded.   Intake/Output      12/15 0701 - 12/16 0700 12/16 0701 - 12/17 0700   P.O. 240    Total Intake(mL/kg) 240 (2.1)    Urine (mL/kg/hr)     Total Output     Net +240         Urine Occurrence 4 x       LABORATORY DATA: Recent Labs    09/23/17 1136 09/23/17 1848 09/24/17 1047  WBC 11.7* 9.8 8.8  HGB 12.5 12.2 11.7*  HCT 38.6 38.7 38.0  PLT 282 291 253   Recent Labs    09/23/17 1136 09/23/17 1848 09/24/17 1047  NA 139  --  138  K 4.2  --  4.4  CL 104  --  104  CO2 27  --  26  BUN 24*  --  15  CREATININE 0.92 0.80 0.95  GLUCOSE 141*  --  159*  CALCIUM 9.1  --  8.4*   No results found for: INR, PROTIME  Examination:  General appearance: alert, cooperative and no distress Extremities: extremities normal, atraumatic, no cyanosis or edema  Wound Exam: clean, dry, intact   Drainage:  None: wound tissue dry  Motor Exam: Anterior Tibial, Posterior Tibial, Quadriceps and Hamstrings Intact  Sensory Exam: Superficial Peroneal, Deep Peroneal and Tibial normal   Assessment:    3  Days Post-Op  Procedure(s) (LRB): EXTERNAL FIXATION ankle (Left)  ADDITIONAL DIAGNOSIS:  Principal Problem:   Closed left ankle fracture Active Problems:   Ankle dislocation, left, initial encounter   Thyroid disease   Sleep apnea   Osteoporosis   Type 2 diabetes mellitus ([facility])   Trimalleolar fracture of ankle, closed, left, initial encounter     Plan: Physical Therapy as ordered Non Weight Bearing (NWB)  Patient doing very well and pain is under control. Plan for ankle ORIF tomorrow with Dr Doreatha Martin. NPO after midnight tonight        Donia Ast 09/27/2017, 9:14 AM

## 2017-09-28 ENCOUNTER — Encounter (HOSPITAL_COMMUNITY)
Admission: EM | Disposition: A | Discharge status: Skilled Nursing Facility | Payer: Self-pay | Source: Home / Self Care | Attending: Student

## 2017-09-28 ENCOUNTER — Inpatient Hospital Stay (HOSPITAL_COMMUNITY): Payer: PPO | Admitting: Certified Registered Nurse Anesthetist

## 2017-09-28 ENCOUNTER — Encounter (HOSPITAL_COMMUNITY): Payer: Self-pay | Admitting: Certified Registered Nurse Anesthetist

## 2017-09-28 ENCOUNTER — Inpatient Hospital Stay (HOSPITAL_COMMUNITY): Payer: PPO

## 2017-09-28 HISTORY — PX: EXTERNAL FIXATION REMOVAL: SHX5040

## 2017-09-28 HISTORY — PX: ORIF ANKLE FRACTURE: SHX5408

## 2017-09-28 LAB — GLUCOSE, CAPILLARY
GLUCOSE-CAPILLARY: 116 mg/dL — AB (ref 65–99)
GLUCOSE-CAPILLARY: 114 mg/dL — AB (ref 65–99)
GLUCOSE-CAPILLARY: 178 mg/dL — AB (ref 65–99)
Glucose-Capillary: 101 mg/dL — ABNORMAL HIGH (ref 65–99)
Glucose-Capillary: 84 mg/dL (ref 65–99)

## 2017-09-28 LAB — PTH, INTACT AND CALCIUM
Calcium, Total (PTH): 9.2 mg/dL (ref 8.7–10.3)
PTH: 52 pg/mL (ref 15–65)

## 2017-09-28 IMAGING — RF DG C-ARM 61-120 MIN
1 series · 4 of 4 positions shown · non-contrast
Comparison: Fluoro spot radiographs of [DATE] and in
plaster views of the left ankle dated [DATE].

CLINICAL DATA: Removal of external fixation devices and subsequent
ORIF of ankle fractures.

EXAM:
DG C-ARM 61-120 MIN

[Series 1: run · 4 of 4 slices shown]
[im 1/4]
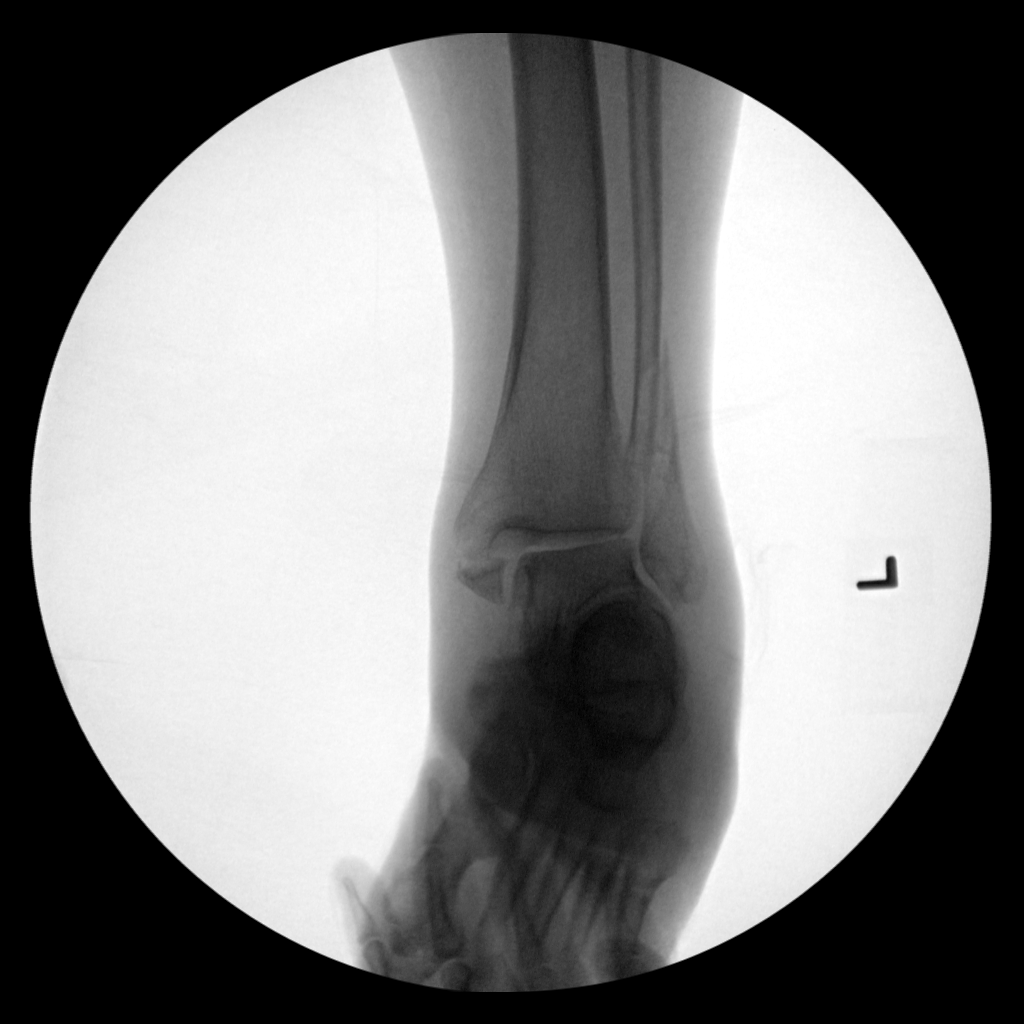
[im 2/4]
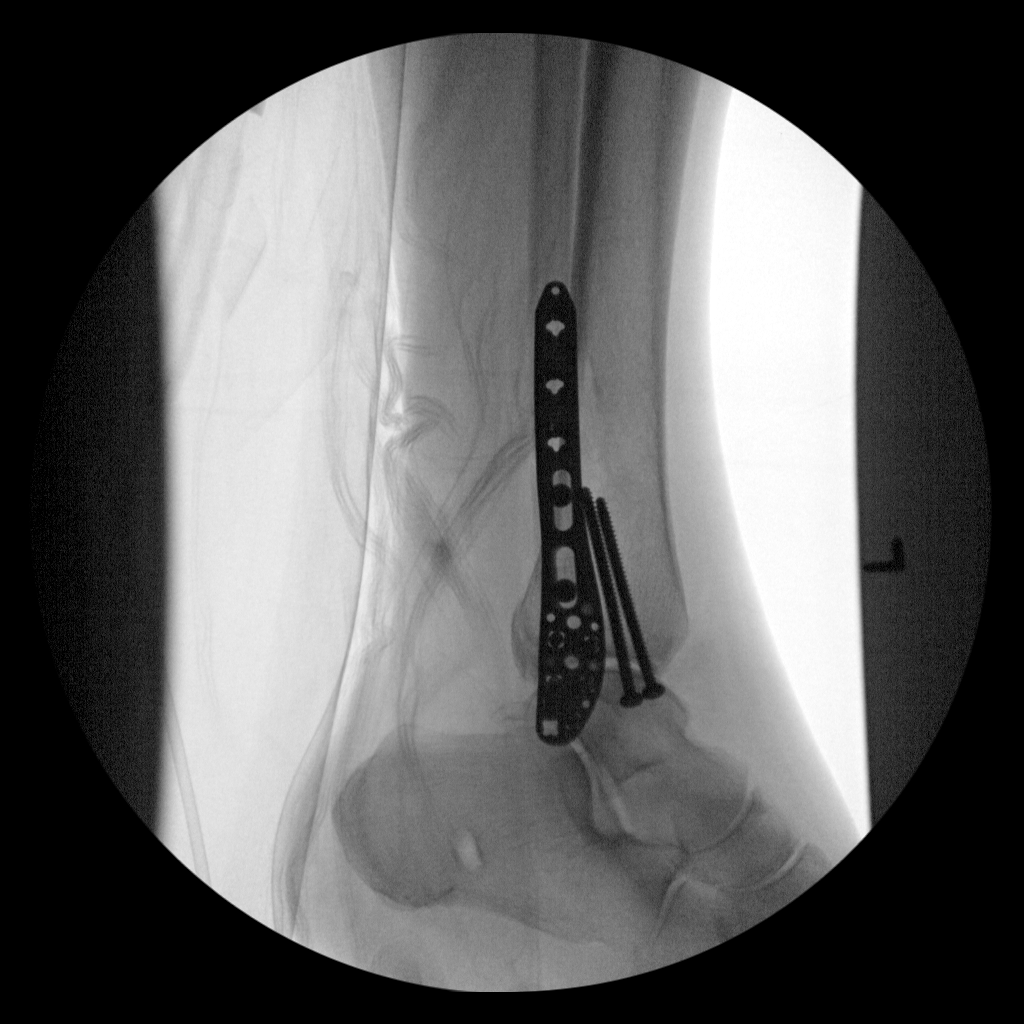
[im 3/4]
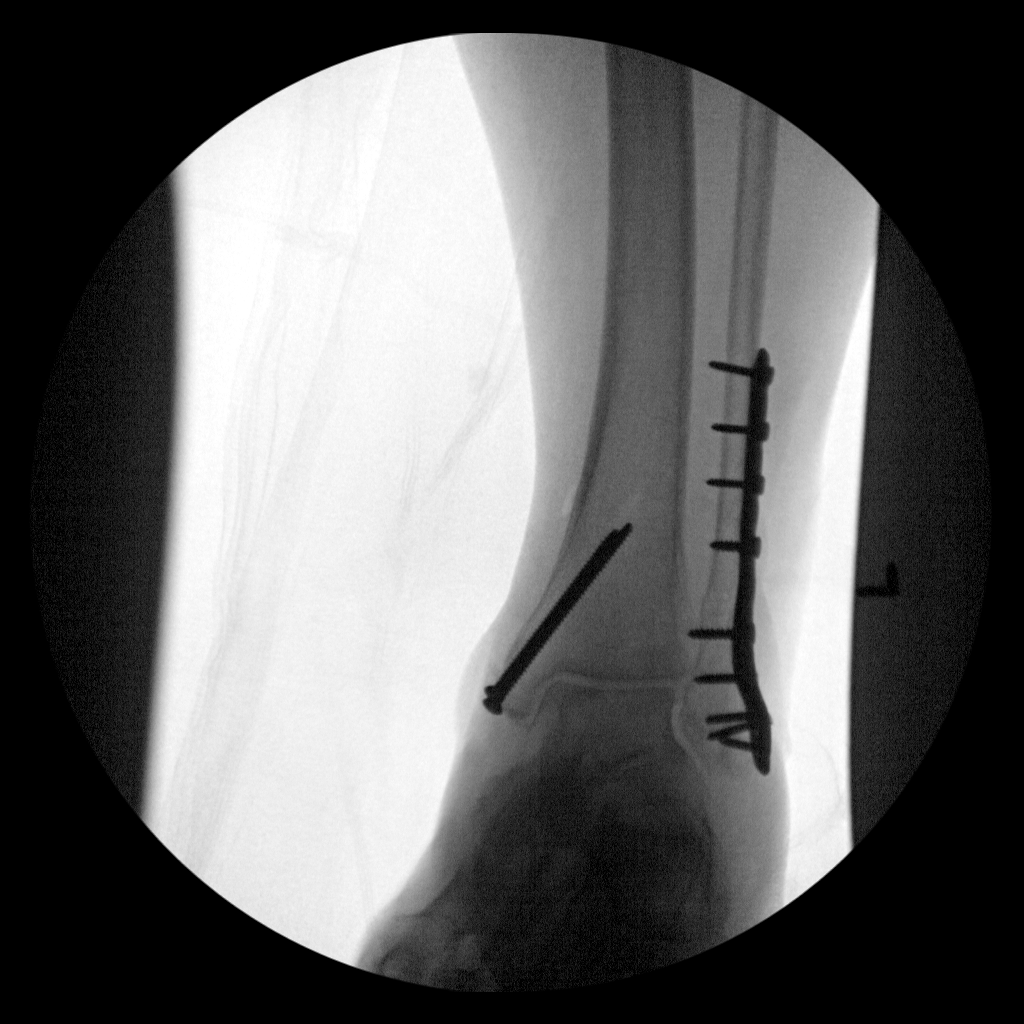
[im 4/4]
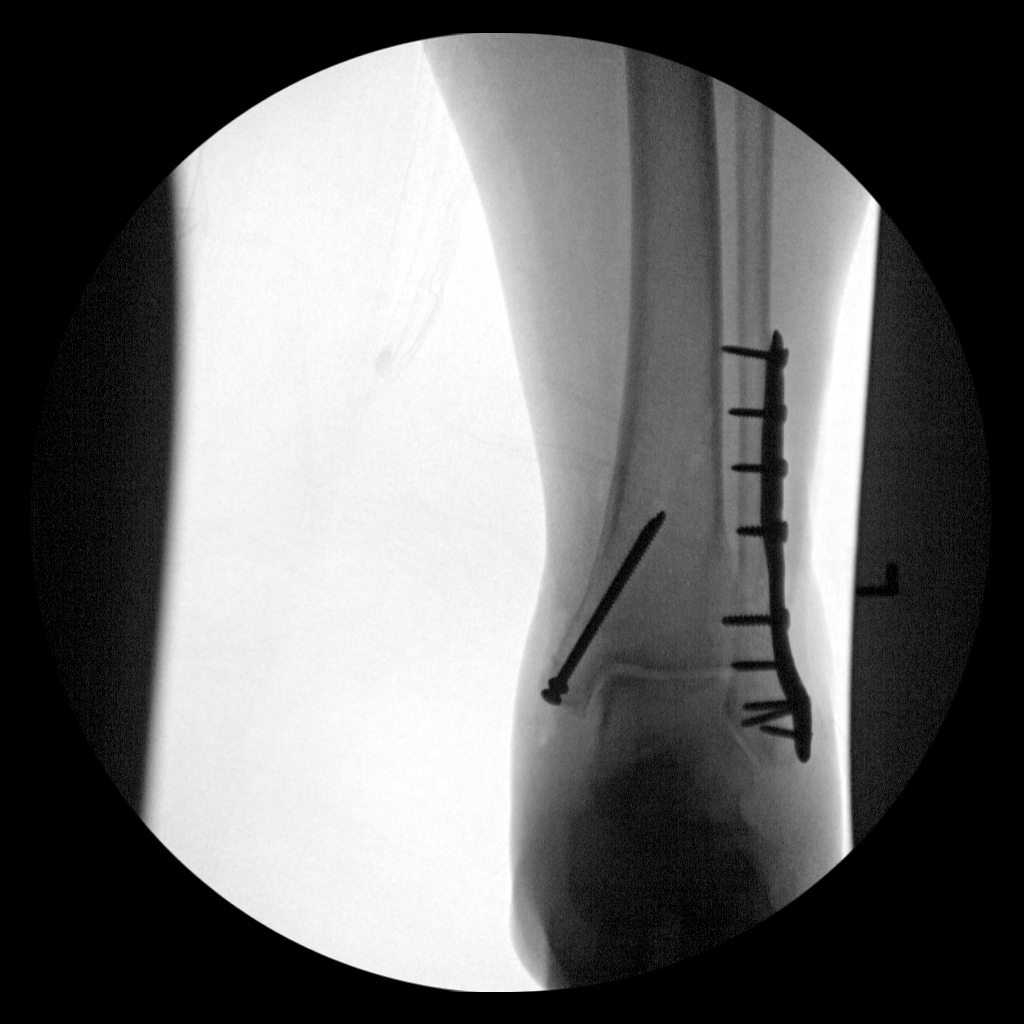

[4 of 4 positions shown; findings below may reference images not displayed]

FINDINGS: Four fluoro spot radiographs are reviewed. The reported fluoro time
is 44 seconds. The patient has undergone removal of external
fixation devices. There is mild distraction of the medial malleolar
fracture which is subsequently reduced with 2 cortical screws being
placed. The ankle joint mortise is reduced on the subsequent images.
Plate and screw fixation reduces the spiral fracture of the distal
fibular metaphysis. A probable posterior malleolar fracture is
nondisplaced.
IMPRESSION: Successful ORIF for displaced medial malleolar and distal fibular
fractures. No immediate postprocedure complication.

## 2017-09-28 IMAGING — DX DG ANKLE COMPLETE 3+V*L*
2 series · 2 of 2 positions shown · non-contrast
Comparison: [DATE]

CLINICAL DATA: Fracture ORIF

EXAM:
LEFT ANKLE COMPLETE - 3+ VIEW

[ankle ap]
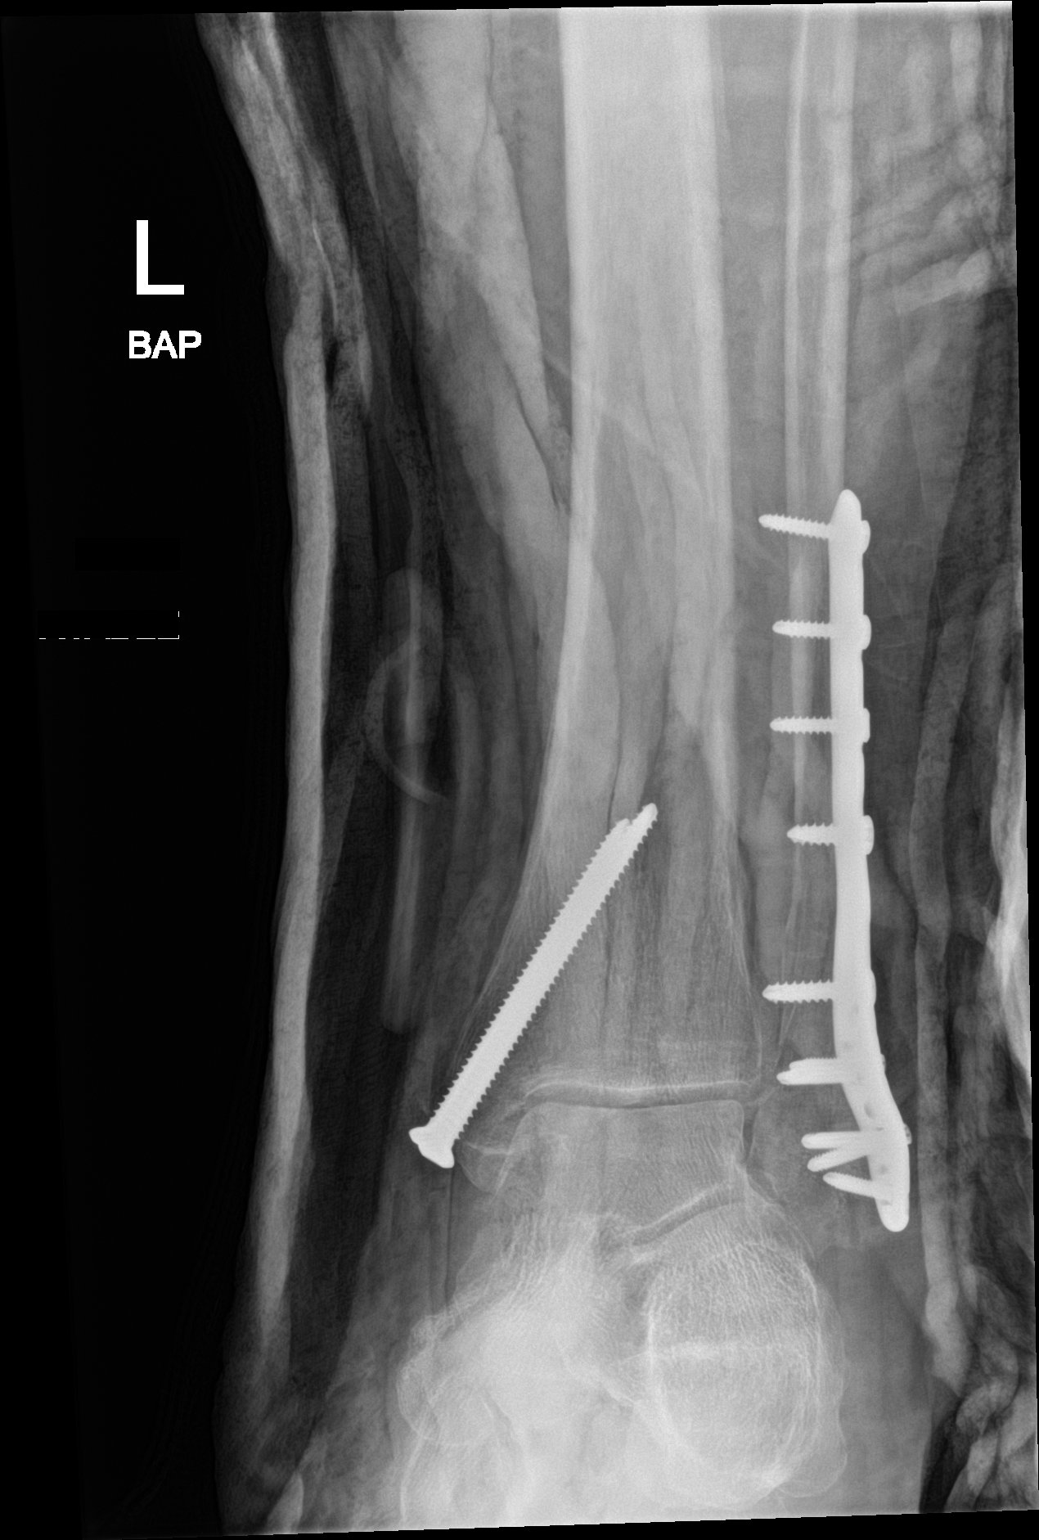

[ankle lat]
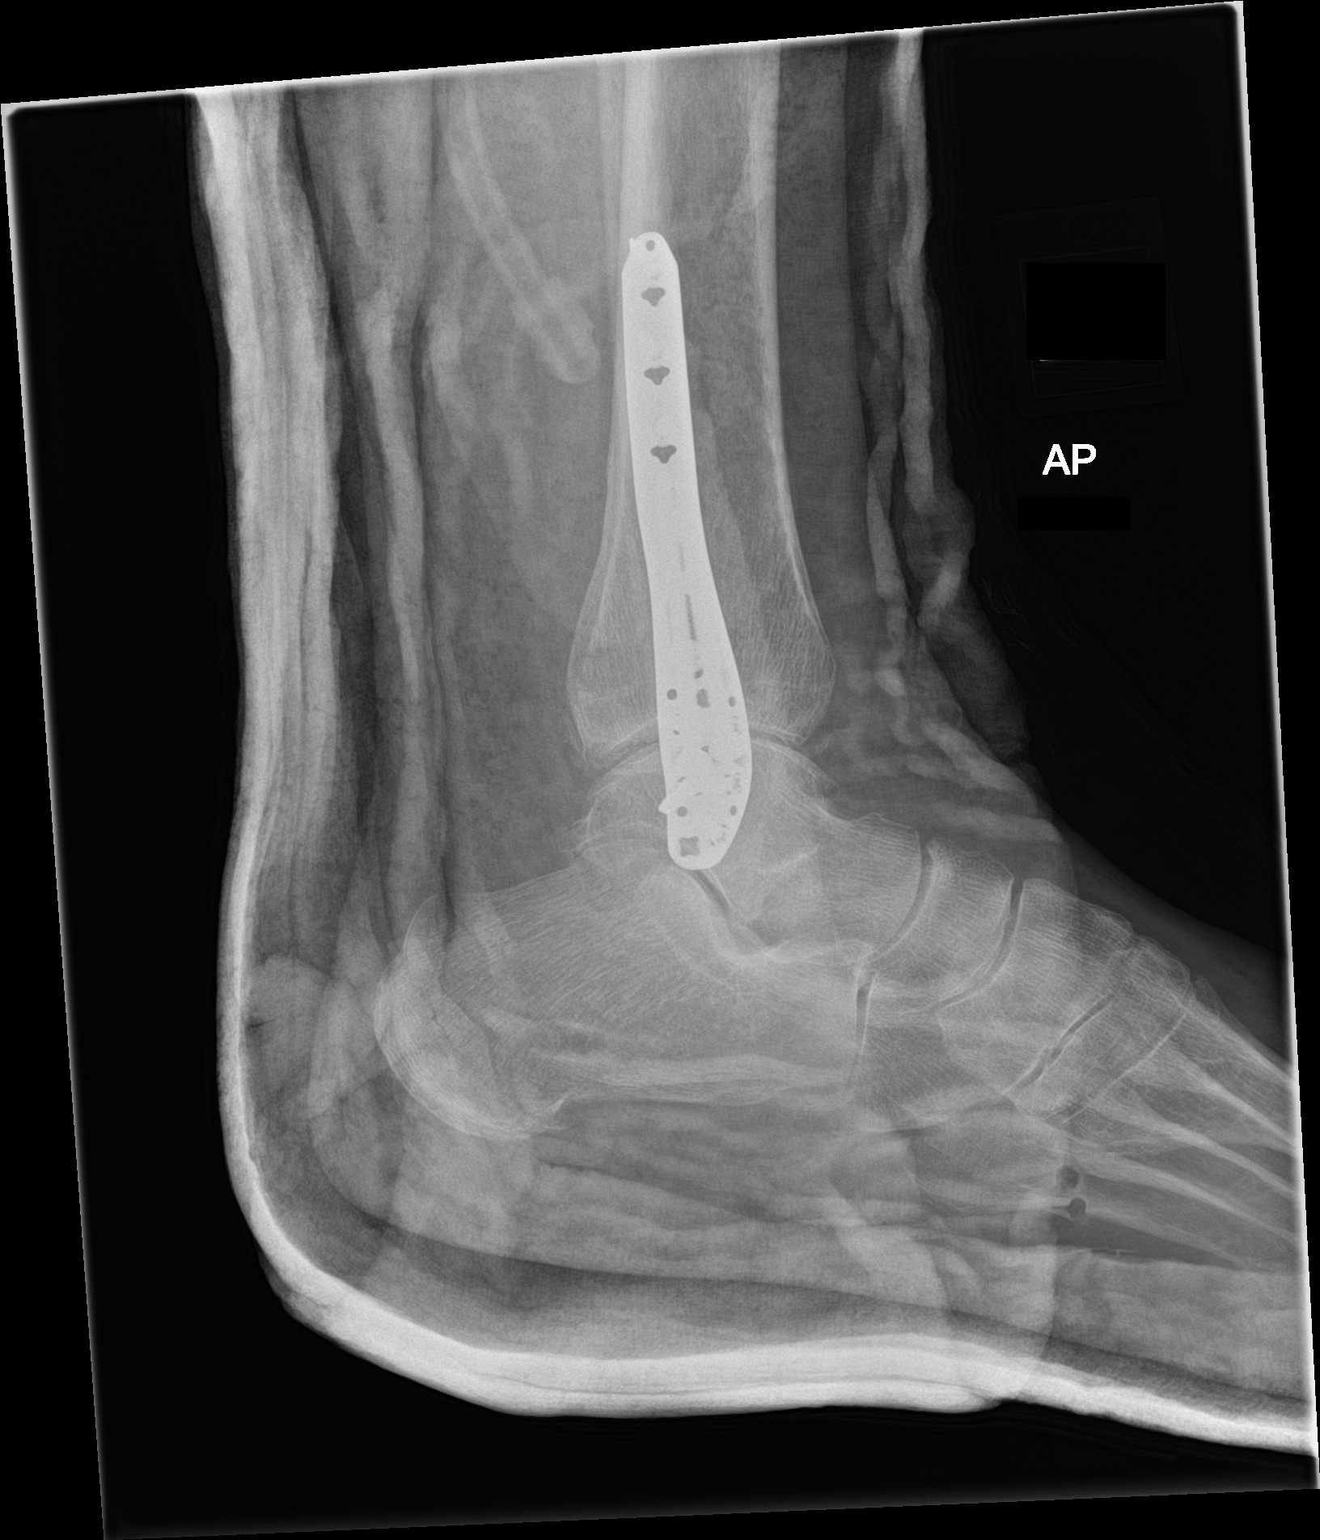

[2 of 2 positions shown; findings below may reference images not displayed]

FINDINGS: Interval reduction of ankle fracture. Plate and screw fixation
fibular fracture. Two screws across the medial malleolar fracture.
Fracture alignment satisfactory. Ankle mortise intact. Plaster
splint.
IMPRESSION: Satisfactory ORIF bimalleolar ankle fracture.

## 2017-09-28 SURGERY — OPEN REDUCTION INTERNAL FIXATION (ORIF) ANKLE FRACTURE
Anesthesia: General | Laterality: Left

## 2017-09-28 MED ORDER — PROPOFOL 10 MG/ML IV BOLUS
INTRAVENOUS | Status: DC | PRN
  Administered 2017-09-28: 100 mg via INTRAVENOUS

## 2017-09-28 MED ORDER — FENTANYL CITRATE (PF) 100 MCG/2ML IJ SOLN
50.0000 ug | Freq: Once | INTRAMUSCULAR | Status: AC
  Administered 2017-09-28: 50 ug via INTRAVENOUS

## 2017-09-28 MED ORDER — MIDAZOLAM HCL 2 MG/2ML IJ SOLN
INTRAMUSCULAR | Status: AC
  Filled 2017-09-28: qty 2

## 2017-09-28 MED ORDER — ROCURONIUM BROMIDE 100 MG/10ML IV SOLN
INTRAVENOUS | Status: DC | PRN
  Administered 2017-09-28: 40 mg via INTRAVENOUS

## 2017-09-28 MED ORDER — FENTANYL CITRATE (PF) 100 MCG/2ML IJ SOLN
INTRAMUSCULAR | Status: DC | PRN
  Administered 2017-09-28 (×2): 50 ug via INTRAVENOUS

## 2017-09-28 MED ORDER — PHENYLEPHRINE HCL 10 MG/ML IJ SOLN
INTRAVENOUS | Status: DC | PRN
  Administered 2017-09-28: 50 ug/min via INTRAVENOUS

## 2017-09-28 MED ORDER — LIDOCAINE 2% (20 MG/ML) 5 ML SYRINGE
INTRAMUSCULAR | Status: AC
  Filled 2017-09-28: qty 5

## 2017-09-28 MED ORDER — KETOROLAC TROMETHAMINE 30 MG/ML IJ SOLN: 15.0000 mg | Freq: Once | INTRAMUSCULAR | Status: DC | PRN

## 2017-09-28 MED ORDER — SUGAMMADEX SODIUM 200 MG/2ML IV SOLN
INTRAVENOUS | Status: AC
  Filled 2017-09-28: qty 2

## 2017-09-28 MED ORDER — FENTANYL CITRATE (PF) 250 MCG/5ML IJ SOLN
INTRAMUSCULAR | Status: AC
  Filled 2017-09-28: qty 5

## 2017-09-28 MED ORDER — PROMETHAZINE HCL 25 MG/ML IJ SOLN: 6.2500 mg | INTRAMUSCULAR | Status: DC | PRN

## 2017-09-28 MED ORDER — VANCOMYCIN HCL 1000 MG IV SOLR
INTRAVENOUS | Status: AC
  Filled 2017-09-28: qty 1000

## 2017-09-28 MED ORDER — LACTATED RINGERS IV SOLN
INTRAVENOUS | Status: DC | PRN
  Administered 2017-09-28 (×2): via INTRAVENOUS

## 2017-09-28 MED ORDER — LIDOCAINE HCL (CARDIAC) 20 MG/ML IV SOLN
INTRAVENOUS | Status: DC | PRN
  Administered 2017-09-28: 60 mg via INTRAVENOUS

## 2017-09-28 MED ORDER — 0.9 % SODIUM CHLORIDE (POUR BTL) OPTIME
TOPICAL | Status: DC | PRN
  Administered 2017-09-28: 1000 mL

## 2017-09-28 MED ORDER — ROPIVACAINE HCL 5 MG/ML IJ SOLN
INTRAMUSCULAR | Status: DC | PRN
  Administered 2017-09-28: 30 mL via PERINEURAL

## 2017-09-28 MED ORDER — VANCOMYCIN HCL 1000 MG IV SOLR
INTRAVENOUS | Status: DC | PRN
  Administered 2017-09-28: 1000 mg

## 2017-09-28 MED ORDER — MIDAZOLAM HCL 5 MG/5ML IJ SOLN
INTRAMUSCULAR | Status: DC | PRN
  Administered 2017-09-28 (×2): 1 mg via INTRAVENOUS

## 2017-09-28 MED ORDER — DIPHENHYDRAMINE HCL 50 MG/ML IJ SOLN
INTRAMUSCULAR | Status: AC
  Filled 2017-09-28: qty 1

## 2017-09-28 MED ORDER — CLINDAMYCIN PHOSPHATE 900 MG/50ML IV SOLN
900.0000 mg | Freq: Three times a day (TID) | INTRAVENOUS | Status: AC
  Administered 2017-09-28 – 2017-09-29 (×2): 900 mg via INTRAVENOUS
  Filled 2017-09-28 (×3): qty 50

## 2017-09-28 MED ORDER — ACETAMINOPHEN 10 MG/ML IV SOLN
INTRAVENOUS | Status: DC | PRN
  Administered 2017-09-28: 1000 mg via INTRAVENOUS

## 2017-09-28 MED ORDER — GLYCOPYRROLATE 0.2 MG/ML IJ SOLN
INTRAMUSCULAR | Status: DC | PRN
Start: 2017-09-28 — End: 2017-09-28
  Administered 2017-09-28: 0.2 mg via INTRAVENOUS
  Administered 2017-09-28: 0.1 mg via INTRAVENOUS

## 2017-09-28 MED ORDER — ONDANSETRON HCL 4 MG/2ML IJ SOLN
INTRAMUSCULAR | Status: AC
  Filled 2017-09-28: qty 2

## 2017-09-28 MED ORDER — LACTATED RINGERS IV SOLN
INTRAVENOUS | Status: DC
  Administered 2017-09-28 – 2017-09-30 (×2): via INTRAVENOUS

## 2017-09-28 MED ORDER — DEXAMETHASONE SODIUM PHOSPHATE 10 MG/ML IJ SOLN
INTRAMUSCULAR | Status: DC | PRN
  Administered 2017-09-28: 10 mg via INTRAVENOUS

## 2017-09-28 MED ORDER — ACETAMINOPHEN 10 MG/ML IV SOLN
INTRAVENOUS | Status: AC
  Filled 2017-09-28: qty 100

## 2017-09-28 MED ORDER — ONDANSETRON HCL 4 MG/2ML IJ SOLN
INTRAMUSCULAR | Status: DC | PRN
  Administered 2017-09-28: 4 mg via INTRAVENOUS

## 2017-09-28 MED ORDER — DIPHENHYDRAMINE HCL 50 MG/ML IJ SOLN
INTRAMUSCULAR | Status: DC | PRN
  Administered 2017-09-28: 25 mg via INTRAVENOUS

## 2017-09-28 MED ORDER — PROPOFOL 10 MG/ML IV BOLUS
INTRAVENOUS | Status: AC
  Filled 2017-09-28: qty 40

## 2017-09-28 MED ORDER — SCOPOLAMINE 1 MG/3DAYS TD PT72
MEDICATED_PATCH | TRANSDERMAL | Status: AC
  Filled 2017-09-28: qty 1

## 2017-09-28 MED ORDER — CLINDAMYCIN PHOSPHATE 900 MG/50ML IV SOLN
900.0000 mg | INTRAVENOUS | Status: AC
  Administered 2017-09-28: 900 mg via INTRAVENOUS
  Filled 2017-09-28 (×2): qty 50

## 2017-09-28 MED ORDER — DEXAMETHASONE SODIUM PHOSPHATE 10 MG/ML IJ SOLN
INTRAMUSCULAR | Status: AC
  Filled 2017-09-28: qty 1

## 2017-09-28 MED ORDER — ROCURONIUM BROMIDE 10 MG/ML (PF) SYRINGE
PREFILLED_SYRINGE | INTRAVENOUS | Status: AC
  Filled 2017-09-28: qty 5

## 2017-09-28 MED ORDER — HYDROMORPHONE HCL 1 MG/ML IJ SOLN: 0.2500 mg | INTRAMUSCULAR | Status: DC | PRN

## 2017-09-28 MED ORDER — SUGAMMADEX SODIUM 500 MG/5ML IV SOLN
INTRAVENOUS | Status: DC | PRN
  Administered 2017-09-28: 400 mg via INTRAVENOUS

## 2017-09-28 MED ORDER — FENTANYL CITRATE (PF) 100 MCG/2ML IJ SOLN
INTRAMUSCULAR | Status: AC
  Administered 2017-09-28: 50 ug via INTRAVENOUS
  Filled 2017-09-28: qty 2

## 2017-09-28 MED ORDER — MIDAZOLAM HCL 2 MG/2ML IJ SOLN: 2.0000 mg | Freq: Once | INTRAMUSCULAR | Status: DC

## 2017-09-28 SURGICAL SUPPLY — 71 items
BANDAGE ACE 4X5 VEL STRL LF (GAUZE/BANDAGES/DRESSINGS) ×2 IMPLANT
BANDAGE ACE 6X5 VEL STRL LF (GAUZE/BANDAGES/DRESSINGS) ×2 IMPLANT
BANDAGE ELASTIC 6 VELCRO ST LF (GAUZE/BANDAGES/DRESSINGS) ×1 IMPLANT
BANDAGE ESMARK 6X9 LF (GAUZE/BANDAGES/DRESSINGS) ×1 IMPLANT
BIT DRILL LCP QC 2X140 (BIT) ×1 IMPLANT
BNDG CMPR 9X6 STRL LF SNTH (GAUZE/BANDAGES/DRESSINGS) ×1
BNDG COHESIVE 4X5 TAN STRL (GAUZE/BANDAGES/DRESSINGS) ×1 IMPLANT
BNDG ESMARK 6X9 LF (GAUZE/BANDAGES/DRESSINGS) ×2
BNDG GAUZE ELAST 4 BULKY (GAUZE/BANDAGES/DRESSINGS) ×2 IMPLANT
BRUSH SCRUB SURG 4.25 DISP (MISCELLANEOUS) ×3 IMPLANT
CHLORAPREP W/TINT 26ML (MISCELLANEOUS) ×3 IMPLANT
COVER MAYO STAND STRL (DRAPES) ×2 IMPLANT
COVER SURGICAL LIGHT HANDLE (MISCELLANEOUS) ×3 IMPLANT
DRAPE C-ARM 42X72 X-RAY (DRAPES) ×2 IMPLANT
DRAPE C-ARMOR (DRAPES) ×2 IMPLANT
DRAPE HALF SHEET 40X57 (DRAPES) ×4 IMPLANT
DRAPE ORTHO SPLIT 77X108 STRL (DRAPES) ×4
DRAPE SURG ORHT 6 SPLT 77X108 (DRAPES) ×2 IMPLANT
DRAPE U-SHAPE 47X51 STRL (DRAPES) ×2 IMPLANT
DRESSING PREVENA PLUS CUSTOM (GAUZE/BANDAGES/DRESSINGS) IMPLANT
DRSG ADAPTIC 3X8 NADH LF (GAUZE/BANDAGES/DRESSINGS) ×1 IMPLANT
DRSG EMULSION OIL 3X3 NADH (GAUZE/BANDAGES/DRESSINGS) IMPLANT
DRSG MEPITEL 3X4 ME34 (GAUZE/BANDAGES/DRESSINGS) ×1 IMPLANT
DRSG PREVENA PLUS CUSTOM (GAUZE/BANDAGES/DRESSINGS) ×2
ELECT REM PT RETURN 9FT ADLT (ELECTROSURGICAL) ×2
ELECTRODE REM PT RTRN 9FT ADLT (ELECTROSURGICAL) ×1 IMPLANT
GAUZE SPONGE 4X4 12PLY STRL (GAUZE/BANDAGES/DRESSINGS) ×2 IMPLANT
GAUZE SPONGE 4X4 12PLY STRL LF (GAUZE/BANDAGES/DRESSINGS) ×1 IMPLANT
GLOVE BIO SURGEON STRL SZ7.5 (GLOVE) ×8 IMPLANT
GLOVE BIOGEL PI IND STRL 7.5 (GLOVE) ×1 IMPLANT
GLOVE BIOGEL PI INDICATOR 7.5 (GLOVE) ×1
GOWN STRL REUS W/ TWL LRG LVL3 (GOWN DISPOSABLE) ×2 IMPLANT
GOWN STRL REUS W/TWL LRG LVL3 (GOWN DISPOSABLE) ×4
KIT BASIN OR (CUSTOM PROCEDURE TRAY) ×2 IMPLANT
KIT PREVENA INCISION MGT 13 (CANNISTER) ×1 IMPLANT
KIT ROOM TURNOVER OR (KITS) ×2 IMPLANT
MANIFOLD NEPTUNE II (INSTRUMENTS) ×1 IMPLANT
NDL HYPO 21X1.5 SAFETY (NEEDLE) IMPLANT
NEEDLE HYPO 21X1.5 SAFETY (NEEDLE) IMPLANT
NS IRRIG 1000ML POUR BTL (IV SOLUTION) ×2 IMPLANT
PACK TOTAL JOINT (CUSTOM PROCEDURE TRAY) ×2 IMPLANT
PAD ARMBOARD 7.5X6 YLW CONV (MISCELLANEOUS) ×4 IMPLANT
PAD CAST 4YDX4 CTTN HI CHSV (CAST SUPPLIES) IMPLANT
PADDING CAST COTTON 4X4 STRL (CAST SUPPLIES)
PADDING CAST COTTON 6X4 STRL (CAST SUPPLIES) ×4 IMPLANT
PLATE 5H 2.7 LCK LAT DIST FIB (Plate) ×1 IMPLANT
SCREW CORTEX 2.7 SLF-TPNG 16MM (Screw) ×1 IMPLANT
SCREW CORTEX 3.5 55MM (Screw) ×1 IMPLANT
SCREW CORTEX 3.5 60MM (Screw) ×1 IMPLANT
SCREW LCK SLF-TPNG STRDRV 12 (Screw) ×1 IMPLANT
SCREW LOCK THRD ST TIB 3.5X16 (Screw) ×1 IMPLANT
SCREW LOCK VA ST 2.7X12 (Screw) ×1 IMPLANT
SCREW LOCK VA ST 2.7X14 (Screw) ×2 IMPLANT
SCREW LOCKING 2.7X16MM VA (Screw) ×2 IMPLANT
SCREW SELF TAP 14MM (Screw) ×2 IMPLANT
SPLINT PLASTER CAST XFAST 5X30 (CAST SUPPLIES) IMPLANT
SPLINT PLASTER XFAST SET 5X30 (CAST SUPPLIES) ×1
SPONGE LAP 18X18 X RAY DECT (DISPOSABLE) ×1 IMPLANT
STAPLER VISISTAT 35W (STAPLE) ×2 IMPLANT
SUCTION FRAZIER HANDLE 10FR (MISCELLANEOUS) ×1
SUCTION TUBE FRAZIER 10FR DISP (MISCELLANEOUS) ×1 IMPLANT
SUT ETHILON 3 0 PS 1 (SUTURE) ×4 IMPLANT
SUT MNCRL AB 3-0 PS2 18 (SUTURE) ×2 IMPLANT
SUT MON AB 2-0 CT1 36 (SUTURE) ×2 IMPLANT
SUT PROLENE 0 CT (SUTURE) IMPLANT
SUT VIC AB 2-0 CT1 27 (SUTURE) ×4
SUT VIC AB 2-0 CT1 TAPERPNT 27 (SUTURE) ×2 IMPLANT
TOWEL OR 17X24 6PK STRL BLUE (TOWEL DISPOSABLE) ×3 IMPLANT
TUBE CONNECTING 12X1/4 (SUCTIONS) ×2 IMPLANT
UNDERPAD 30X30 (UNDERPADS AND DIAPERS) ×2 IMPLANT
WND VAC CANISTER 500ML (MISCELLANEOUS) ×1 IMPLANT

## 2017-09-28 NOTE — Op Note (Signed)
OrthopaedicSurgeryOperativeNote ([id number]) Date of Surgery: 09/28/2017  Admit Date: 09/23/2017   Diagnoses: Pre-Op Diagnoses: Left trimalleolar ankle fracture dislocation   Post-Op Diagnosis: Same  Procedures: 1. CPT 27822-ORIF Left trimalleolar ankle fracture without posterior malleolus fixation 2. CPT 20694-Removal of external fixator 3. CPT 11044-Ex-fix pin site debridement and closure 4. CPT 97605-Incisional wound vac placement  Surgeons: Primary: Shona Needles, MD   Location:[facility] OR ROOM 07   AnesthesiaGeneral   Antibiotics:Clindamycin 900mg   Tourniquettime: Total Tourniquet Time Documented: Thigh (Left) - 65 minutes Total: Thigh (Left) - 65 minutes  EstimatedBloodLoss:Minimal  Complications: None  Specimens:None  Implants: Implant Name Type Inv. Item Serial No. Manufacturer Lot No. LRB No. Used Action  5 HOLE PLATE LEFT     [employer]ES [employer]  Left 1 Implanted  SCREW LOCKING 2.7X16MM VA - [id number] Screw SCREW LOCKING 2.7X16MM VA  [employer]  Left 2 Implanted  SCREW LOCKING 2.7X14MM - [id number] Screw SCREW LOCKING 2.7X14MM  [employer]  Left 2 Implanted  SCREW LOCKING 2.7X12MM - [id number] Screw SCREW LOCKING 2.7X12MM  [employer]  Left 1 Implanted  SCREW SELF TAP 14MM - [id number] Screw SCREW SELF TAP 14MM  [employer]  Left 2 Implanted  SCREW CORTEX 2.7 SLF-TPNG 16MM - [id number] Screw SCREW CORTEX 2.7 SLF-TPNG 16MM  [employer]  Left 1 Implanted  SCREW LOCKING 2.7X16MM - [id number] Screw SCREW LOCKING 2.7X16MM  [employer]  Left 1 Implanted  3.5 CORTICAL 12MM SCREW    [employer]  Left 1 Implanted  SCREW CORTEX 3.5 60MM - [id number] Screw SCREW CORTEX 3.5 60MM  [employer]  Left 1 Implanted  SCREW CORTEX 3.5 55MM - [id number] Screw SCREW CORTEX 3.5 55MM  [employer]  Left 1 Implanted    IndicationsforSurgery: This is a 71 year old female who sustained a ground-level fall and had a left closed trimalleolar ankle  fracture dislocation.  Unfortunately she was not appropriately reduced in the emergency room and was admitted for external fixation of following day as she was too swollen to proceed with definitive fixation.  She was admitted and elevated over the weekend and she is appropriate for definitive fixation.  Risks and benefits were discussed with the patient. Risks discussed included bleeding requiring blood transfusion, bleeding causing a hematoma, infection, malunion, nonunion, damage to surrounding nerves and blood vessels, pain, hardware prominence or irritation, hardware failure, stiffness, post-traumatic arthritis, DVT/PE, compartment syndrome, and even death. Risks and benefits were extensively discussed as noted above and the patient and their family agreed to proceed with surgery and consent was obtained.  Operative Findings: 1.  Successful removal of external fixation with open reduction internal fixation of left trimalleolar ankle fracture with a fixation of lateral malleolus with a Synthes distal fibular locking plate.  The medial malleolus was fixed with two, 3.5 mm lag screws. 2.  Stable syndesmosis with no widening upon external rotation stress. 3.  Incisional wound VAC placement over the medial malleolus incision.  Procedure: The patient was identified in the preoperative holding area. Consent was confirmed with the patient and their family and all questions were answered. The operative extremity was marked after confirmation with the patient. she was then brought back to the operating room by our anesthesia colleagues.  She was carefully transferred over to a radiolucent flat top table.  Here she was placed under general anesthetic.  I removed the external fixator and then proceeded to place a bump underneath her left hip. The operative extremity was then prepped and draped in usual sterile fashion. A  preoperative timeout was performed to verify the patient, the procedure, and the extremity.  Preoperative antibiotics were dosed.  I obtained fluoroscopic images to show the injury.  I then marked out a lateral incision and exsanguinate the extremity and elevate the tourniquet to 300 mmHg.  Total tourniquet time was 65 minutes.  I then made a lateral incision over the fibula.  I carried this down through skin and subcutaneous tissue.  I tried to keep as much periosteum and soft tissue to a small butterfly fragment posteriorly to prevent significant stripping.  I then performed a reduction and then used reduction forceps to hold this.  I provisionally pinned this in place and then chose an appropriate length Synthes distal fibula locking plate.  I obtained x-ray to show adequate placement on AP and lateral views.  I then placed a nonlocking screw distally and proximally to hold the plate in place.  I then placed a 5 distal locking screws into the distal fibula.  I then placed a 3 nonlocking screws into the proximal fibula gaining excellent purchase.  I then proceeded to come over to the medial side.  There was some serous fracture blisters that I unroofed.  The area of the incision was clean without any signs of blistering so I proceeded to make a small curvilinear incision over the medial malleolus carried this down through skin and subcutaneous tissue.  Took care not to damage the saphenous nerve or vein.  I then debrided the fracture made a small drill hole in the metaphysis and used a small periarticular reduction clamp to hold the reduction in place while I placed to a long 3.5 millimeter screws and holding the medial malleolus in place.  These a screws were placed under fluoroscopic guidance.  Final fluoroscopic images were obtained.  I then performed a external rotation stress view which showed no widening of the mortise.  I felt that the syndesmosis was stable and felt no further fixation was needed.  I thoroughly irrigated the wounds.  I placed 1 g of vancomycin powder between the 2 incisions.   I then performed a layered closure consisting of 2-0 Vicryl and 3-0 nylon.  I used a Praveena wound VAC over the medial side and used Mepitel over the blisters as well as over the lateral incision.  A sterile dressing consisting of 4 x 4's sterile cast padding and a well-padded short leg splint was placed.  The patient was then awoken from anesthesia and taken to the PACU in stable condition.  Post Op Plan/Instructions: The patient will be nonweightbearing to the left lower extremity.  We will keep the splint in place until postoperative day 2.  At that point we will take down the dressing and check her incision.  If everything looks good she may be discharged.  We will continue Lovenox for DVT prophylaxis and provide clindamycin for postoperative surgical prophylaxis.  I was present and performed the entire surgery.  Katha Hamming, MD [facility]

## 2017-09-28 NOTE — Consult Note (Signed)
            [facility] CM Primary Care Navigator  09/28/2017  Destiny [name] [birth year] [id number]   Went to see patient at the bedside to identify possible discharge needs but she is off the unit for surgery per staff report. (OPEN REDUCTION INTERNAL FIXATION (ORIF) ANKLE FRACTURE (Left ) REMOVAL EXTERNAL FIXATION LEG (Left ).    Will attempt to meet patient at another time when she is available in the room.     Addendum: (09/30/17):    Went back to see patient at the bedside to identify possible discharge needs. Patientreportsthatshe "slid on the ice at sister's house" that hadled to this admission/ surgery.  Patient end[facility] Deland [facility] asherprimary [facility].   [facility] [address] [facility].  Patient reportsmanagingher ownmedicationsat homewith use of "pill box" system filled once a month.  Patientverbalized that she was driving prior to admission/ surgery. Patient states that son Destiny [name]- from [place]) or friend Destiny [name]) will be able to provide transportation to herdoctors'appointments after discharge.  Patient lives alone and independent with self care prior to this admission. According to patient, she plans to stay with her sister (who has sons and daughter assisting) or with her friend if needed after returning back home.  Anticipated discharge plan is skilled nursing facility (SNF- [facility])for short term rehabilitationper therapy recommendation.  Patientvoiced understanding to callprimary care provider's officewhen shegets backhometo schedulea post discharge follow-upwithin1-2 weeksor sooner if needed. Patient letter (with PCP's contact number) was provided as areminder.  Explained to [facility] CM services available for health management at home.She reports being able to manage diabetes well so far (with  recent A1C of 6.4) on diet, medication, exercise and close follow-up with primary care provider when needed. Patientvoiced understandingto seek referral from primary care provider to [facility] care management asdeemed necessary and appropriatefor services in the future- whenshereturns back home.  [facility] care management information provided for future needs that may arise.   For additional questions please contact:  Edwena Felty A. Camellia Popescu, BSN, RN-BC [facility] PRIMARY CARE Navigator Cell: [phone]

## 2017-09-28 NOTE — Transfer of Care (Signed)
Immediate Anesthesia Transfer of Care Note  Patient: Destiny [name]  Procedure(s) Performed: OPEN REDUCTION INTERNAL FIXATION (ORIF) ANKLE FRACTURE (Left ) REMOVAL EXTERNAL FIXATION LEG (Left )  Patient Location: PACU  Anesthesia Type:GA combined with regional for post-op pain  Level of Consciousness: drowsy  Airway & Oxygen Therapy: Patient Spontanous Breathing and Patient connected to face mask oxygen  Post-op Assessment: Report given to RN and Post -op Vital signs reviewed and stable  Post vital signs: Reviewed and stable  Last Vitals:  Vitals:   09/28/17 1347 09/28/17 1638  BP:  133/88  Pulse: 61 95  Resp: (!) 21 12  Temp:    SpO2: 97% 92%    Last Pain:  Vitals:   09/28/17 1110  TempSrc:   PainSc: 2       Patients Stated Pain Goal: 3 (81/27/51 7001)  Complications: No apparent anesthesia complications

## 2017-09-28 NOTE — Anesthesia Preprocedure Evaluation (Signed)
Anesthesia Evaluation  Patient identified by MRN, date of birth, ID band Patient awake    Reviewed: Allergy & Precautions, NPO status , Patient's Chart, lab work & pertinent test results  Airway Mallampati: II  TM Distance: >3 FB Neck ROM: Full    Dental no notable dental hx.    Pulmonary sleep apnea , former smoker,    Pulmonary exam normal breath sounds clear to auscultation       Cardiovascular negative cardio ROS Normal cardiovascular exam Rhythm:Regular Rate:Normal     Neuro/Psych negative neurological ROS  negative psych ROS   GI/Hepatic negative GI ROS, Neg liver ROS,   Endo/Other  diabetesMorbid obesity  Renal/GU negative Renal ROS  negative genitourinary   Musculoskeletal negative musculoskeletal ROS (+)   Abdominal (+) + obese,   Peds negative pediatric ROS (+)  Hematology negative hematology ROS (+)   Anesthesia Other Findings   Reproductive/Obstetrics negative OB ROS                             Anesthesia Physical Anesthesia Plan  ASA: III  Anesthesia Plan: General   Post-op Pain Management:    Induction: Intravenous  PONV Risk Score and Plan: 2 and Ondansetron, Dexamethasone and Treatment may vary due to age or medical condition  Airway Management Planned: Oral ETT  Additional Equipment:   Intra-op Plan:   Post-operative Plan: Extubation in OR  Informed Consent: I have reviewed the patients History and Physical, chart, labs and discussed the procedure including the risks, benefits and alternatives for the proposed anesthesia with the patient or authorized representative who has indicated his/her understanding and acceptance.   Dental advisory given  Plan Discussed with: CRNA and Surgeon  Anesthesia Plan Comments:         Anesthesia Quick Evaluation

## 2017-09-28 NOTE — Anesthesia Procedure Notes (Signed)
Procedure Name: Intubation Date/Time: 09/28/2017 2:33 PM Performed by: Myrtie Soman, MD Pre-anesthesia Checklist: Patient identified, Emergency Drugs available, Suction available, Patient being monitored and Timeout performed Patient Re-evaluated:Patient Re-evaluated prior to induction Oxygen Delivery Method: Circle system utilized Preoxygenation: Pre-oxygenation with 100% oxygen Induction Type: IV induction and Cricoid Pressure applied Ventilation: Mask ventilation without difficulty Laryngoscope Size: Mac and 3 Grade View: Grade I Tube type: Oral Number of attempts: 1 Airway Equipment and Method: Stylet Placement Confirmation: ETT inserted through vocal cords under direct vision,  positive ETCO2 and breath sounds checked- equal and bilateral Secured at: 21 cm Tube secured with: Tape Dental Injury: Teeth and Oropharynx as per pre-operative assessment

## 2017-09-28 NOTE — Anesthesia Procedure Notes (Signed)
Anesthesia Regional Block: Popliteal block   Pre-Anesthetic Checklist: ,, timeout performed, Correct Patient, Correct Site, Correct Laterality, Correct Procedure, Correct Position, site marked, Risks and benefits discussed,  Surgical consent,  Pre-op evaluation,  At surgeon's request and post-op pain management  Laterality: Left  Prep: chloraprep       Needles:  Injection technique: Single-shot  Needle Type: Echogenic Needle     Needle Length: 9cm      Additional Needles:   Procedures:,,,, ultrasound used (permanent image in chart),,,,  Narrative:  Start time: 09/28/2017 1:30 PM End time: 09/28/2017 1:40 PM Injection made incrementally with aspirations every 5 mL.  Performed by: Personally  Anesthesiologist: Myrtie Soman, MD  Additional Notes: Patient tolerated the procedure well without complications

## 2017-09-28 NOTE — Progress Notes (Signed)
Physical Therapy Cancellation Note   09/28/17 1439  PT Visit Information  Last PT Received On 09/28/17  Reason Eval/Treat Not Completed Patient at procedure or test/unavailable; Pt in OR for ORIF. PT will continue to follow acutely.   Earney Navy, PTA Pager: [phone]

## 2017-09-28 NOTE — Care Management Important Message (Signed)
Important Message  Patient Details  Name: Destiny [name] MRN: [id number] Date of Birth: 1946/09/07   Medicare Important Message Given:  Yes    Yuvonne Lanahan Montine Circle 09/28/2017, 12:10 PM

## 2017-09-28 NOTE — Progress Notes (Signed)
Orthopaedic Trauma Progress Note  S: Continues to complain of burning type pain in her left lower extremity.  Pain medications do help.  She has been up with therapy.  O:  Vitals:   09/27/17 2054 09/28/17 0411  BP: (!) 99/50 (!) 108/57  Pulse: 66 71  Resp: 16 18  Temp: 97.6 F (36.4 C) 97.9 F (36.6 C)  SpO2: 91% 94%   Gen: NAD, awake and alert LLE: Pin sites with minimal drainage, swelling appropriate, comaprtments soft, Sensation intact and motor function intact there is a.  Sore on the back of her heel.  Likely from pressure.  The medial side is still swollen but wrinkles moderately.  There are some serous blisters anterior to a planned incision.  The lateral side appears fine.  Labs:  CBC    Component Value Date/Time   WBC 8.8 09/24/2017 1047   RBC 4.18 09/24/2017 1047   HGB 11.7 (L) 09/24/2017 1047   HCT 38.0 09/24/2017 1047   PLT 253 09/24/2017 1047   MCV 90.9 09/24/2017 1047   MCH 28.0 09/24/2017 1047   MCHC 30.8 09/24/2017 1047   RDW 14.9 09/24/2017 1047   LYMPHSABS 2.1 09/23/2017 1136   MONOABS 0.5 09/23/2017 1136   EOSABS 0.2 09/23/2017 1136   BASOSABS 0.0 09/23/2017 11334    A/P: 71 year old female with left ankle fracture dislocation s.p external fixation  -Recommend [facility]B -PT/OT -Pressure relief off the posterior heel. -Plan for ORIF today -Likely SNF postoperatively.  Shona Needles, MD [facility] [phone] (phone)

## 2017-09-29 LAB — GLUCOSE, CAPILLARY
GLUCOSE-CAPILLARY: 117 mg/dL — AB (ref 65–99)
GLUCOSE-CAPILLARY: 116 mg/dL — AB (ref 65–99)
Glucose-Capillary: 112 mg/dL — ABNORMAL HIGH (ref 65–99)
Glucose-Capillary: 90 mg/dL (ref 65–99)

## 2017-09-29 NOTE — Anesthesia Postprocedure Evaluation (Signed)
Anesthesia Post Note  Patient: Destiny [name]  Procedure(s) Performed: OPEN REDUCTION INTERNAL FIXATION (ORIF) ANKLE FRACTURE (Left ) REMOVAL EXTERNAL FIXATION LEG (Left )     Patient location during evaluation: PACU Anesthesia Type: General Level of consciousness: awake and alert Pain management: pain level controlled Vital Signs Assessment: post-procedure vital signs reviewed and stable Respiratory status: spontaneous breathing, nonlabored ventilation, respiratory function stable and patient connected to nasal cannula oxygen Cardiovascular status: blood pressure returned to baseline and stable Postop Assessment: no apparent nausea or vomiting Anesthetic complications: no    Last Vitals:  Vitals:   09/28/17 2058 09/29/17 0542  BP: (!) 110/54 124/64  Pulse: 72 60  Resp: 16   Temp: 36.5 C (!) 36.3 C  SpO2: 92% 97%    Last Pain:  Vitals:   09/29/17 0545  TempSrc:   PainSc: 0-No pain                 Monic Engelmann S

## 2017-09-29 NOTE — Social Work (Addendum)
CSW spoke with [facility] as requested by pt and pt son, [facility] still cannot offer placement due to lack of bed.  [facility] still able to offer to pt. CSW will follow up with pt/pt son to see where they have chosen for SNF placement from offers presented.   2:15pm- CSW stopped by pt room; pt had friends visiting and requested CSW stop back by in a few minutes  2:57pm- CSW spoke with pt and pt son (via speakerphone) to discuss discharge planning and SNF preference, pt niece was also present during discussion with permission from pt. CSW let pt and family know that [facility] and [facility] had not offered placement, and that [facility] would be able to offer if pt and family were amenable. CSW was informed that they are amenable. Pt had called insurance company and felt she had a better grasp on what her coverage was. Pt requested CSW confirm with [facility] placement.   3:21pm- CSW confirmed placement at [facility] with [facility] admissions, pending discharge tomorrow. [facility] is prepared for pt tomorrow.   3:42pm- [employer] has approved pt for 7 days, Auth#: [id number] they are aware pt has selected [facility].   CSW continuing to follow.   Alexander Mt, [place] Work [phone]

## 2017-09-29 NOTE — Social Work (Signed)
CSW provided pt son pkt of SNF options.  Pt was heading into surgery for her ankle and pt son stated that they would talk about their options when she was back and rested from her surgery.   CSW will continue to follow.   Alexander Mt, [place] Work [phone]

## 2017-09-29 NOTE — Progress Notes (Signed)
Physical Therapy Treatment Patient Details Name: Destiny [name] MRN: [id number] DOB: 15-Jan-1946 Today's Date: 09/29/2017    History of Present Illness Pt is a 71 y/o female with history of diabetes, osteoporosis, thyroid disease. She presents to the ED s/p a ground-level fall resulting in aL trimalleolar fracture, and is now s/p ORIF (09/28/17). She is NWB on the LLE.     PT Comments    Pt progressing towards physical therapy goals. Was able to perform transfers and ~5 feet ambulation with min assist (+2 for safety/chair follow), and maintained NWB status well. Pt anticipates d/c to SNF tomorrow. Will continue to follow.   Follow Up Recommendations  SNF;DC plan and follow up therapy as arranged by surgeon;Supervision/Assistance - 24 hour     Equipment Recommendations  Wheelchair, wheelchair cushion   Recommendations for Other Services       Precautions / Restrictions Precautions Precautions: Fall Precaution Comments: Wound VAC Restrictions Weight Bearing Restrictions: Yes LLE Weight Bearing: Non weight bearing    Mobility  Bed Mobility Overal bed mobility: Needs Assistance Bed Mobility: Supine to Sit     Supine to sit: Min guard     General bed mobility comments: Close guard for safety however no assist required for transition to EOB.   Transfers Overall transfer level: Needs assistance Equipment used: Rolling walker (2 wheeled) Transfers: Sit to/from Stand Sit to Stand: Min assist;+2 safety/equipment         General transfer comment: VC's for hand placement on seated surface for safety.   Ambulation/Gait Ambulation/Gait assistance: Min assist;+2 safety/equipment Ambulation Distance (Feet): 5 Feet Assistive device: Rolling walker (2 wheeled) Gait Pattern/deviations: Step-to pattern;Decreased stride length;Trunk flexed Gait velocity: Decreased Gait velocity interpretation: Below normal speed for age/gender General Gait Details: RW lowered to increase  leverage through UE's - youth size may be beneficial. Pt able to demonstrate a few hops forward (~5 feet total). Chair follow utilized.    Stairs            Wheelchair Mobility    Modified Rankin (Stroke Patients Only)       Balance Overall balance assessment: Needs assistance Sitting-balance support: Feet supported;No upper extremity supported Sitting balance-Leahy Scale: Good     Standing balance support: Bilateral upper extremity supported Standing balance-Leahy Scale: Poor Standing balance comment: Needs hands on RW at this time for balance due to [place] on LLE                            Cognition Arousal/Alertness: Awake/alert Behavior During Therapy: WFL for tasks assessed/performed Overall Cognitive Status: Within Functional Limits for tasks assessed                                        Exercises General Exercises - Lower Extremity Ankle Circles/Pumps: 20 reps(toe wiggles) Quad Sets: 10 reps    General Comments        Pertinent Vitals/Pain Pain Assessment: Faces Faces Pain Scale: Hurts little more Pain Location: L ankle Pain Descriptors / Indicators: Operative site guarding;Grimacing Pain Intervention(s): Limited activity within patient's tolerance;Monitored during session;Repositioned    Home Living                      Prior Function            PT Goals (current goals can now be found in the  care plan section) Acute Rehab PT Goals Patient Stated Goal: to go to rehab then home PT Goal Formulation: With patient/family Time For Goal Achievement: 10/09/17 Potential to Achieve Goals: Good Progress towards PT goals: Progressing toward goals    Frequency    Min 5X/week      PT Plan Current plan remains appropriate    Co-evaluation              AM-PAC PT "6 Clicks" Daily Activity  Outcome Measure  Difficulty turning over in bed (including adjusting bedclothes, sheets and blankets)?: A  Little Difficulty moving from lying on back to sitting on the side of the bed? : A Little Difficulty sitting down on and standing up from a chair with arms (e.g., wheelchair, bedside commode, etc,.)?: A Little Help needed moving to and from a bed to chair (including a wheelchair)?: A Little Help needed walking in hospital room?: A Little Help needed climbing 3-5 steps with a railing? : Total 6 Click Score: 16    End of Session Equipment Utilized During Treatment: Gait belt Activity Tolerance: Patient tolerated treatment well Patient left: in chair;with call bell/phone within reach;with family/visitor present Nurse Communication: Mobility status PT Visit Diagnosis: Unsteadiness on feet (R26.81);Other abnormalities of gait and mobility (R26.89);Pain Pain - Right/Left: Left Pain - part of body: Leg     Time: 3893-7342 PT Time Calculation (min) (ACUTE ONLY): 24 min  Charges:  $Gait Training: 23-37 mins                    G Codes:       Destiny [name], PT, DPT Acute Rehabilitation Services Pager: [phone]    Destiny [name] 09/29/2017, 1:00 PM

## 2017-09-29 NOTE — Progress Notes (Signed)
Orthopaedic Trauma Progress Note  S: Block working, pain controlled  O:  Vitals:   09/28/17 2058 09/29/17 0542  BP: (!) 110/54 124/64  Pulse: 72 60  Resp: 16   Temp: 97.7 F (36.5 C) (!) 97.3 F (36.3 C)  SpO2: 92% 97%   Gen: NAD, awake and alert LLE: Splint clean, dry and intact, Moves toes but sensation still diminished  Labs:  CBC    Component Value Date/Time   WBC 8.8 09/24/2017 1047   RBC 4.18 09/24/2017 1047   HGB 11.7 (L) 09/24/2017 1047   HCT 38.0 09/24/2017 1047   PLT 253 09/24/2017 1047   MCV 90.9 09/24/2017 1047   MCH 28.0 09/24/2017 1047   MCHC 30.8 09/24/2017 1047   RDW 14.9 09/24/2017 1047   LYMPHSABS 2.1 09/23/2017 1136   MONOABS 0.5 09/23/2017 1136   EOSABS 0.2 09/23/2017 1136   BASOSABS 0.0 09/23/2017 11341    A/P: 71 year old female with left ankle fracture dislocation s.p external fixation and now ORIF  -Recommend NWB -PT/OT -Incisional wound vac down tomorrow -Plan for discharge to SNF likely tomorrow  Shona Needles, MD [facility] [phone] (phone)

## 2017-09-30 ENCOUNTER — Encounter (HOSPITAL_COMMUNITY): Payer: Self-pay | Admitting: Student

## 2017-09-30 DIAGNOSIS — G8911 Acute pain due to trauma: Secondary | ICD-10-CM | POA: Diagnosis not present

## 2017-09-30 DIAGNOSIS — R52 Pain, unspecified: Secondary | ICD-10-CM | POA: Diagnosis not present

## 2017-09-30 DIAGNOSIS — Z299 Encounter for prophylactic measures, unspecified: Secondary | ICD-10-CM | POA: Diagnosis not present

## 2017-09-30 DIAGNOSIS — S9305XD Dislocation of left ankle joint, subsequent encounter: Secondary | ICD-10-CM | POA: Diagnosis not present

## 2017-09-30 DIAGNOSIS — E039 Hypothyroidism, unspecified: Secondary | ICD-10-CM | POA: Diagnosis not present

## 2017-09-30 DIAGNOSIS — R110 Nausea: Secondary | ICD-10-CM | POA: Diagnosis not present

## 2017-09-30 DIAGNOSIS — M810 Age-related osteoporosis without current pathological fracture: Secondary | ICD-10-CM | POA: Diagnosis not present

## 2017-09-30 DIAGNOSIS — K5900 Constipation, unspecified: Secondary | ICD-10-CM | POA: Diagnosis not present

## 2017-09-30 DIAGNOSIS — E785 Hyperlipidemia, unspecified: Secondary | ICD-10-CM | POA: Diagnosis not present

## 2017-09-30 DIAGNOSIS — E569 Vitamin deficiency, unspecified: Secondary | ICD-10-CM | POA: Diagnosis not present

## 2017-09-30 DIAGNOSIS — S82855A Nondisplaced trimalleolar fracture of left lower leg, initial encounter for closed fracture: Secondary | ICD-10-CM | POA: Diagnosis not present

## 2017-09-30 DIAGNOSIS — S82852D Displaced trimalleolar fracture of left lower leg, subsequent encounter for closed fracture with routine healing: Secondary | ICD-10-CM | POA: Diagnosis not present

## 2017-09-30 DIAGNOSIS — E119 Type 2 diabetes mellitus without complications: Secondary | ICD-10-CM | POA: Diagnosis not present

## 2017-09-30 DIAGNOSIS — S82202D Unspecified fracture of shaft of left tibia, subsequent encounter for closed fracture with routine healing: Secondary | ICD-10-CM | POA: Diagnosis not present

## 2017-09-30 DIAGNOSIS — Z111 Encounter for screening for respiratory tuberculosis: Secondary | ICD-10-CM | POA: Diagnosis not present

## 2017-09-30 DIAGNOSIS — S92909A Unspecified fracture of unspecified foot, initial encounter for closed fracture: Secondary | ICD-10-CM | POA: Diagnosis not present

## 2017-09-30 DIAGNOSIS — S82402D Unspecified fracture of shaft of left fibula, subsequent encounter for closed fracture with routine healing: Secondary | ICD-10-CM | POA: Diagnosis not present

## 2017-09-30 DIAGNOSIS — T148XXD Other injury of unspecified body region, subsequent encounter: Secondary | ICD-10-CM | POA: Diagnosis not present

## 2017-09-30 DIAGNOSIS — A498 Other bacterial infections of unspecified site: Secondary | ICD-10-CM | POA: Diagnosis not present

## 2017-09-30 DIAGNOSIS — M6281 Muscle weakness (generalized): Secondary | ICD-10-CM | POA: Diagnosis not present

## 2017-09-30 LAB — GLUCOSE, CAPILLARY
GLUCOSE-CAPILLARY: 106 mg/dL — AB (ref 65–99)
Glucose-Capillary: 112 mg/dL — ABNORMAL HIGH (ref 65–99)
Glucose-Capillary: 94 mg/dL (ref 65–99)

## 2017-09-30 MED ORDER — OXYCODONE-ACETAMINOPHEN 5-325 MG PO TABS: 1.0000 | ORAL_TABLET | ORAL | 0 refills | Status: DC | PRN

## 2017-09-30 MED ORDER — PREGABALIN 75 MG PO CAPS: 75.0000 mg | ORAL_CAPSULE | Freq: Two times a day (BID) | ORAL | 0 refills | Status: DC

## 2017-09-30 MED ORDER — ENOXAPARIN SODIUM 40 MG/0.4ML ~~LOC~~ SOLN: 40.0000 mg | SUBCUTANEOUS | 0 refills | Status: DC

## 2017-09-30 NOTE — Social Work (Signed)
Clinical Social Worker facilitated patient discharge including contacting patient family and facility to confirm patient discharge plans.  Clinical information faxed to facility and family agreeable with plan.  CSW arranged ambulance transport via [facility] to [facility]. RN to call [phone] with report  prior to discharge.  Clinical Social Worker will sign off for now as social work intervention is no longer needed. Please consult us again if new need arises.  Calum Cormier H Penne Rosenstock, LCSWA Clinical Social Worker  

## 2017-09-30 NOTE — Discharge Summary (Signed)
Orthopaedic Trauma Service (OTS)  Patient ID: Destiny [name] MRN: [id number] DOB/AGE: [birth year] 71 y.o.  Admit date: 09/23/2017 Discharge date: 09/30/2017  Admission Diagnoses:Trimalleolar fracture of ankle, closed, left, initial encounter  Discharge Diagnoses:  Principal Problem:   Closed left ankle fracture Active Problems:   Ankle dislocation, left, initial encounter   Thyroid disease   Sleep apnea   Osteoporosis   Type 2 diabetes mellitus ([place])   Trimalleolar fracture of ankle, closed, left, initial encounter   Procedures Performed: 09/24/17: 1. CPT 20692-External fixation of left ankle 2. CPT 27818-Closed reduction of left trimalleolar ankle fracture  09/28/17:Procedures: 1. CPT 27822-ORIF Left trimalleolar ankle fracture without posterior malleolus fixation 2. CPT 20694-Removal of external fixator 3. CPT 11044-Ex-fix pin site debridement and closure 4. CPT 97605-Incisional wound vac placement  Discharged Condition: good  Hospital Course: Patient was admitted from the [facility] emergency room.  She underwent external fixation for her left trimalleolar ankle fracture dislocation.  This was performed on 09/24/2017.  Please see procedure above.  She was elevated and mobilized with physical therapy eventually the swelling came down to where she could undergo definitive fixation.  This was performed on 09/28/2017.  An incisional wound VAC was placed and she was splinted and a wound check was performed postoperative day 2 on 09/30/2017.  The incisions looked healthy and she was cleared from the physical therapist for discharge to a skilled nursing facility.  Upon discharge her pain was well controlled with oral medications.  She was tolerating regular diet and she was voiding spontaneously.  Consults: None  Significant Diagnostic Studies: None  Treatments: surgery: As above  Discharge Exam: Vitals:   09/29/17 2153 09/30/17 0649  BP: (!) 120/52 (!) 115/50  Pulse:  70 75  Resp: 18 18  Temp: (!) 97.5 F (36.4 C) 98.5 F (36.9 C)  SpO2: 95% 92%   Gen: No acute distress.  She is awake alert and oriented x3.  Cooperative and pleasant. Left lower extremity: Splint was in place clean dry and intact.  It was cut down.  The incisional wound VAC was removed.  The incisions are clean dry and intact with no signs of erythema or drainage.  There is some blistering along the anterior ankle that appeared healthy and only superficial.  She was neurovascularly intact with intact dorsiflexion plantarflexion of the ankle as well as the great toe.  She has sensation in the superficial peroneal, deep peroneal, saphenous, sural and tibial nerve distribution.  However it was slightly diminished due to the block that was in place.  She had warm well-perfused foot with brisk cap refill less than 2 seconds.  Disposition: Skilled Nursing Facility  Allergies as of 09/30/2017      Reactions   Penicillins Swelling   Has patient had a PCN reaction causing immediate rash, facial/tongue/throat swelling, SOB or lightheadedness with hypotension: Yes Has patient had a PCN reaction causing severe rash involving mucus membranes or skin necrosis: unknown Has patient had a PCN reaction that required hospitalization: No Has patient had a PCN reaction occurring within the last 10 years: No If all of the above answers are "NO", then may proceed with Cephalosporin use.      Medication List    STOP taking these medications   traMADol 50 MG tablet Commonly known as:  ULTRAM     TAKE these medications   aspirin EC 81 MG tablet Take 81 mg by mouth daily.   enoxaparin 40 MG/0.4ML injection Commonly known as:  LOVENOX  Inject 0.4 mLs (40 mg total) into the skin daily.   HYDROcodone-acetaminophen 5-325 MG tablet Commonly known as:  NORCO/VICODIN Take 1 tablet by mouth every 4 (four) hours as needed.   levothyroxine 137 MCG tablet Commonly known as:  SYNTHROID, LEVOTHROID Take 137 mcg  by mouth daily.   metFORMIN 500 MG 24 hr tablet Commonly known as:  GLUCOPHAGE-XR Take 1,000 mg by mouth 2 (two) times daily. What changed:  Another medication with the same name was removed. Continue taking this medication, and follow the directions you see here.   OCUVITE PO Take 1 tablet by mouth daily.   ondansetron 4 MG disintegrating tablet Commonly known as:  ZOFRAN ODT Take 1 tablet (4 mg total) by mouth every 8 (eight) hours as needed for nausea or vomiting.   oxyCODONE-acetaminophen 5-325 MG tablet Commonly known as:  PERCOCET/ROXICET Take 1-2 tablets by mouth every 4 (four) hours as needed for moderate pain.   pravastatin 20 MG tablet Commonly known as:  PRAVACHOL Take 20 mg by mouth daily.   pregabalin 75 MG capsule Commonly known as:  LYRICA Take 1 capsule (75 mg total) by mouth 2 (two) times daily for 14 days.   VITAMIN D-1000 MAX ST 1000 units tablet Generic drug:  Cholecalciferol Take 1,000 mg by mouth daily.   VITAMIN D2 PO Take 1 tablet by mouth once a week.      Follow-up Information    Haddix, Thomasene Lot, MD. Schedule an appointment as soon as possible for a visit on 10/08/2017.   Specialty:  Orthopedic Surgery Contact information: [place] [address] [phone]           Discharge Instructions and Plan: The patient will be discharged to a skilled nursing facility.  She will remain nonweightbearing on the left lower extremity.  She was fitted for a boot.  This should remain in place most of the time.  She may come out of it to do range of motion exercises 3 times a day.  She should have her surgical incisions dressed daily with Adaptic, 4 x 4's, Kerlix, Ace wrap.  The incision should always be covered while in the boot.  She should return to see me on December 27 for a clinic appointment for x-rays and possible suture removal. She will be on lovenox for DVT prophylaxis.  Signed:  Shona Needles, MD [facility] [phone] (phone) 09/30/2017, 8:57 AM

## 2017-09-30 NOTE — Progress Notes (Signed)
Orthopedic Tech Progress Note Patient Details:  Destiny [name] 03-Oct-1946 [id number]  Ortho Devices Type of Ortho Device: CAM walker Ortho Device/Splint Location: lle Ortho Device/Splint Interventions: Application   Post Interventions Patient Tolerated: Well Instructions Provided: Care of device   Hildred Priest 09/30/2017, 10:07 AM

## 2017-09-30 NOTE — Addendum Note (Signed)
Addendum  created 09/30/17 1141 by Myrtie Soman, MD   Child order released for a procedure order, Intraprocedure Blocks edited, Sign clinical note

## 2017-09-30 NOTE — Progress Notes (Signed)
Physical Therapy Treatment Patient Details Name: Destiny [name] MRN: [id number] DOB: 11/23/1945 Today's Date: 09/30/2017    History of Present Illness Pt is a 71 y/o female with history of diabetes, osteoporosis, thyroid disease. She presents to the ED s/p a ground-level fall resulting in aL trimalleolar fracture, and is now s/p ORIF (09/28/17). She is NWB on the LLE.     PT Comments    Pt is positive and motivated to recover. Pt required min A for steadying during transfers. She ambulated 8 ft x2 in room with min A for steadying and chair to follow. Pt required seated rest break secondary to DOE before second gait trial. Pt would benefit from continued skilled PT to increase safety with mobility and activity tolerance. Expeted to d/c to SNF this PM.    Follow Up Recommendations  SNF;DC plan and follow up therapy as arranged by surgeon;Supervision/Assistance - 24 hour     Equipment Recommendations  None recommended by PT    Recommendations for Other Services       Precautions / Restrictions Precautions Precautions: Fall Precaution Comments: Wound VAC Restrictions Weight Bearing Restrictions: Yes LLE Weight Bearing: Non weight bearing    Mobility  Bed Mobility Overal bed mobility: Needs Assistance             General bed mobility comments: Pt sitting EOB on arrival  Transfers Overall transfer level: Needs assistance Equipment used: Rolling walker (2 wheeled) Transfers: Sit to/from Stand Sit to Stand: Min assist;+2 safety/equipment         General transfer comment: Cues for hand placement and WB status. Min A to power up and for steadying  Ambulation/Gait Ambulation/Gait assistance: Min assist;+2 safety/equipment Ambulation Distance (Feet): 8 Feet(x2) Assistive device: Rolling walker (2 wheeled) Gait Pattern/deviations: Step-to pattern;Decreased stride length(Hop to) Gait velocity: Decreased   General Gait Details: Pt with hop to pattern. Good ability to  hold of LLE for ambulation and maintain NWB.  Pt requiring cues for breathing during gait and out of breath after ambulation.   Stairs            Wheelchair Mobility    Modified Rankin (Stroke Patients Only)       Balance Overall balance assessment: Needs assistance Sitting-balance support: Feet supported;Single extremity supported;No upper extremity supported Sitting balance-Leahy Scale: Good Sitting balance - Comments: sat EOB for ~10 min to perform ADLs and ther ex.  No assist required for balance   Standing balance support: Bilateral upper extremity supported Standing balance-Leahy Scale: Poor Standing balance comment: Needs hands on RW at this time for balance due to [place] on LLE                            Cognition Arousal/Alertness: Awake/alert Behavior During Therapy: WFL for tasks assessed/performed Overall Cognitive Status: Within Functional Limits for tasks assessed                                        Exercises General Exercises - Lower Extremity Long Arc Quad: AROM;Left;10 reps;Seated Hip ABduction/ADduction: AROM;Left;10 reps;Seated Hip Flexion/Marching: AROM;Left;10 reps;Seated    General Comments        Pertinent Vitals/Pain Pain Assessment: Faces Faces Pain Scale: Hurts little more Pain Location: L ankle Pain Descriptors / Indicators: Operative site guarding;Grimacing Pain Intervention(s): Monitored during session;Limited activity within patient's tolerance;Repositioned    Home Living  Prior Function            PT Goals (current goals can now be found in the care plan section) Acute Rehab PT Goals Patient Stated Goal: to go to rehab then home PT Goal Formulation: With patient/family Time For Goal Achievement: 10/09/17 Potential to Achieve Goals: Good Progress towards PT goals: Progressing toward goals    Frequency    Min 5X/week      PT Plan Current plan remains  appropriate    Co-evaluation              AM-PAC PT "6 Clicks" Daily Activity  Outcome Measure  Difficulty turning over in bed (including adjusting bedclothes, sheets and blankets)?: A Little Difficulty moving from lying on back to sitting on the side of the bed? : A Little Difficulty sitting down on and standing up from a chair with arms (e.g., wheelchair, bedside commode, etc,.)?: A Little Help needed moving to and from a bed to chair (including a wheelchair)?: A Little Help needed walking in hospital room?: A Little Help needed climbing 3-5 steps with a railing? : Total 6 Click Score: 16    End of Session Equipment Utilized During Treatment: Gait belt Activity Tolerance: Patient tolerated treatment well Patient left: in chair;with call bell/phone within reach;with family/visitor present Nurse Communication: Mobility status PT Visit Diagnosis: Unsteadiness on feet (R26.81);Other abnormalities of gait and mobility (R26.89);Pain Pain - Right/Left: Left Pain - part of body: Leg     Time: 9784-7841 PT Time Calculation (min) (ACUTE ONLY): 28 min  Charges:  $Gait Training: 8-22 mins                    G Codes:      Benjiman Core, [place] Pager [phone] Acute Rehab   Allena Katz 09/30/2017, 12:04 PM

## 2017-09-30 NOTE — Social Work (Addendum)
CSW informed that pt room at [facility] will be ready at lunchtime, CSW will assist in discharge.   CSW informed pt of discharge timing.   12:00pm- CSW informed by RN that pt boot has been fitted by ortho and that pt is good for discharge whenever [facility] is ready.  12:30pm- CSW spoke with pt and pt son at bedside, pt son gathering pt things and will bring them to [facility], aware [facility] will come around 2:30pm.   CSW will arrange transportation via [place] continuing to follow to support discharge to [facility] today.  Alexander Mt, [place] Work [phone]

## 2017-09-30 NOTE — Progress Notes (Signed)
09/30/17 1300  OT Visit Information  Last OT Received On 09/30/17  Assistance Needed +2  PT/OT/SLP Co-Evaluation/Treatment Yes  Reason for Co-Treatment For patient/therapist safety  OT goals addressed during session ADL's and self-care  History of Present Illness Pt is a 71 y/o female with history of diabetes, osteoporosis, thyroid disease. She presents to the ED s/p a ground-level fall resulting in aL trimalleolar fracture, and is now s/p ORIF (09/28/17). She is NWB on the LLE.   Precautions  Precautions Fall  Precaution Comments wound vac discontinued  Required Braces or Orthoses Other Brace/Splint  Other Brace/Splint CAM boot on L  Pain Assessment  Pain Assessment Faces  Faces Pain Scale 4  Pain Location L ankle  Pain Descriptors / Indicators Throbbing;Grimacing  Pain Intervention(s) Monitored during session  Cognition  Arousal/Alertness Awake/alert  Behavior During Therapy WFL for tasks assessed/performed  Overall Cognitive Status Within Functional Limits for tasks assessed  ADL  Overall ADL's  Needs assistance/impaired  Upper Body Dressing  Set up;Sitting  Lower Body Dressing Maximal assistance;Sitting/lateral leans  Lower Body Dressing Details (indicate cue type and reason) CAM boot  Toilet Transfer Details (indicate cue type and reason) educated in use of 3 in 1 over toilet  Tub/Shower Transfer Details (indicate cue type and reason) educated on tub transfer bench use  Functional mobility during ADLs Minimal assistance;+2 for safety/equipment  General ADL Comments Pt's son with many questions about how pt will manage at home as she will be NWB x 8 weeks. Educated in fall prevention, making sure doors are wide, use of DME for ADL, prepared meals in freezer.  Bed Mobility  General bed mobility comments Pt sitting EOB on arrival  Balance  Overall balance assessment Needs assistance  Sitting balance-Leahy Scale Good  Standing balance-Leahy Scale Poor  Restrictions  Weight  Bearing Restrictions Yes  LLE Weight Bearing NWB  Transfers  Overall transfer level Needs assistance  Equipment used Rolling walker (2 wheeled)  Transfers Sit to/from Stand  Sit to Stand Min assist;+2 safety/equipment  General transfer comment Cues for hand placement and WB status. Min A to power up and for steadying  OT - End of Session  Equipment Utilized During Treatment Gait belt;Rolling walker  Activity Tolerance Patient tolerated treatment well  Patient left in chair;with call bell/phone within reach;with family/visitor present  OT Assessment/Plan  OT Plan Discharge plan remains appropriate  OT Visit Diagnosis Other abnormalities of gait and mobility (R26.89);Unsteadiness on feet (R26.81)  OT Frequency (ACUTE ONLY) Min 2X/week  Follow Up Recommendations SNF;Supervision/Assistance - 24 hour  AM-PAC OT "6 Clicks" Daily Activity Outcome Measure  Help from another person eating meals? 4  Help from another person taking care of personal grooming? 3  Help from another person toileting, which includes using toliet, bedpan, or urinal? 3  Help from another person bathing (including washing, rinsing, drying)? 2  Help from another person to put on and taking off regular upper body clothing? 4  Help from another person to put on and taking off regular lower body clothing? 2  6 Click Score 18  ADL G Code Conversion CK  OT Goal Progression  Progress towards OT goals Progressing toward goals  Acute Rehab OT Goals  Patient Stated Goal to go to rehab then home  OT Goal Formulation With patient  Time For Goal Achievement 10/09/17  Potential to Achieve Goals Good  ADL Goals  Additional ADL Goal #1 Pt will be S in and OOB for basic ADLs  OT Time Calculation  OT Start Time (ACUTE ONLY) 1121  OT Stop Time (ACUTE ONLY) 1149  OT Time Calculation (min) 28 min  OT General Charges  $OT Visit 1 Visit  OT Treatments  $Self Care/Home Management  8-22 mins  09/30/2017 Nestor Lewandowsky, OTR/L Pager:  [phone]

## 2017-09-30 NOTE — Discharge Instructions (Signed)
Orthopaedic Trauma Service Discharge Instructions   General Discharge Instructions  WEIGHT BEARING STATUS: Nonweightbearing to the left leg  RANGE OF MOTION/ACTIVITY: Come out of the boot three times a day to work on range of motion exercises of the ankle including dorsiflexion/plantarflexion (up-down) and ankle circles  Wound Care: Daily dressing changes to surgical incisions with adaptic, 4x4s, kerlix, and then an ACE wrap. Please keep incision covered while in boot.  DVT/PE prophylaxis: Lovenox daily for DVT prophylaxis  Diet: as you were eating previously.  Can use over the counter stool softeners and bowel preparations, such as Miralax, to help with bowel movements.  Narcotics can be constipating.  Be sure to drink plenty of fluids  PAIN MEDICATION USE AND EXPECTATIONS  You have likely been given narcotic medications to help control your pain.  After a traumatic event that results in an fracture (broken bone) with or without surgery, it is ok to use narcotic pain medications to help control one's pain.  We understand that everyone responds to pain differently and each individual patient will be evaluated on a regular basis for the continued need for narcotic medications. Ideally, narcotic medication use should last no more than 6-8 weeks (coinciding with fracture healing).   As a patient it is your responsibility as well to monitor narcotic medication use and report the amount and frequency you use these medications when you come to your office visit.   We would also advise that if you are using narcotic medications, you should take a dose prior to therapy to maximize you participation.  IF YOU ARE ON NARCOTIC MEDICATIONS IT IS NOT PERMISSIBLE TO OPERATE A MOTOR VEHICLE (MOTORCYCLE/CAR/TRUCK/MOPED) OR HEAVY MACHINERY DO NOT MIX NARCOTICS WITH OTHER CNS (CENTRAL NERVOUS SYSTEM) DEPRESSANTS SUCH AS ALCOHOL   STOP SMOKING OR USING NICOTINE PRODUCTS!!!!  As discussed nicotine severely  impairs your body's ability to heal surgical and traumatic wounds but also impairs bone healing.  Wounds and bone heal by forming microscopic blood vessels (angiogenesis) and nicotine is a vasoconstrictor (essentially, shrinks blood vessels).  Therefore, if vasoconstriction occurs to these microscopic blood vessels they essentially disappear and are unable to deliver necessary nutrients to the healing tissue.  This is one modifiable factor that you can do to dramatically increase your chances of healing your injury.    (This means no smoking, no nicotine gum, patches, etc)  DO NOT USE NONSTEROIDAL ANTI-INFLAMMATORY DRUGS (NSAID'S)  Using products such as Advil (ibuprofen), Aleve (naproxen), Motrin (ibuprofen) for additional pain control during fracture healing can delay and/or prevent the healing response.  If you would like to take over the counter (OTC) medication, Tylenol (acetaminophen) is ok.  However, some narcotic medications that are given for pain control contain acetaminophen as well. Therefore, you should not exceed more than 4000 mg of tylenol in a day if you do not have liver disease.  Also note that there are may OTC medicines, such as cold medicines and allergy medicines that my contain tylenol as well.  If you have any questions about medications and/or interactions please ask your doctor/PA or your pharmacist.   ICE AND ELEVATE INJURED/OPERATIVE EXTREMITY  Using ice and elevating the injured extremity above your heart can help with swelling and pain control.  Icing in a pulsatile fashion, such as 20 minutes on and 20 minutes off, can be followed.    Do not place ice directly on skin. Make sure there is a barrier between to skin and the ice pack.    Using  frozen items such as frozen peas works well as the conform nicely to the are that needs to be iced.  USE AN ACE WRAP OR TED HOSE FOR SWELLING CONTROL  In addition to icing and elevation, Ace wraps or TED hose are used to help limit and  resolve swelling.  It is recommended to use Ace wraps or TED hose until you are informed to stop.    When using Ace Wraps start the wrapping distally (farthest away from the body) and wrap proximally (closer to the body)   Example: If you had surgery on your leg or thing and you do not have a splint on, start the ace wrap at the toes and work your way up to the thigh        If you had surgery on your upper extremity and do not have a splint on, start the ace wrap at your fingers and work your way up to the upper arm  IF YOU ARE IN A CAM BOOT (BLACK BOOT)  You may remove boot periodically. Perform daily dressing changes as noted above.  Wash the liner of the boot regularly and wear a sock when wearing the boot. It is recommended that you sleep in the boot until told otherwise  CALL THE OFFICE WITH ANY QUESTIONS OR CONCERNS: [phone]    Discharge Wound Care Instructions  Do NOT apply any ointments, solutions or lotions to pin sites or surgical wounds.  These prevent needed drainage and even though solutions like hydrogen peroxide kill bacteria, they also damage cells lining the pin sites that help fight infection.  Applying lotions or ointments can keep the wounds moist and can cause them to breakdown and open up as well. This can increase the risk for infection. When in doubt call the office.  Surgical incisions should be dressed daily.  If any drainage is noted, use one layer of adaptic, then gauze, Kerlix, and an ace wrap.  Once the incision is completely dry and without drainage, it may be left open to air out.  Showering may begin 36-48 hours later.  Cleaning gently with soap and water.  Traumatic wounds should be dressed daily as well.    One layer of adaptic, gauze, Kerlix, then ace wrap.  The adaptic can be discontinued once the draining has ceased

## 2017-09-30 NOTE — Clinical Social Work Placement (Signed)
   CLINICAL SOCIAL WORK PLACEMENT  NOTE  [place]   Date:  09/30/2017  Patient Details  Name: Destiny [name] MRN: [id number] Date of Birth: Feb 22, 1946  Clinical Social Work is seeking post-discharge placement for this patient at the [place] level of care (*CSW will initial, date and re-position this form in  chart as items are completed):  Yes   Patient/family provided with [facility] Work Department's list of facilities offering this level of care within the geographic area requested by the patient (or if unable, by the patient's family).  Yes   Patient/family informed of their freedom to choose among providers that offer the needed level of care, that participate in Medicare, Medicaid or managed care program needed by the patient, have an available bed and are willing to accept the patient.  Yes   Patient/family informed of [facility]'s ownership interest in [facility] and [facility], as well as of the fact that they are under no obligation to receive care at these facilities.  PASRR submitted to EDS on       PASRR number received on 09/25/17     Existing PASRR number confirmed on       FL2 transmitted to all facilities in geographic area requested by pt/family on 09/25/17     FL2 transmitted to all facilities within larger geographic area on       Patient informed that his/her managed care company has contracts with or will negotiate with certain facilities, including the following:        Yes   Patient/family informed of bed offers received.  Patient chooses bed at [facility]     Physician recommends and patient chooses bed at      Patient to be transferred to [facility] on 09/30/17.  Patient to be transferred to facility by [facility]     Patient family notified on 09/30/17 of transfer.  Name of family member notified:  Sydell Axon     PHYSICIAN       Additional Comment:     _______________________________________________ Alexander [place], [place] 09/30/2017, 10:41 AM

## 2017-09-30 NOTE — Progress Notes (Signed)
1430 Patient discharged to SNF [facility]), transferred by [facility]. Report given to Lattie Haw, Therapist, art at [facility].

## 2017-09-30 NOTE — Anesthesia Procedure Notes (Signed)
Anesthesia Procedure Image    

## 2017-10-01 DIAGNOSIS — E039 Hypothyroidism, unspecified: Secondary | ICD-10-CM | POA: Diagnosis not present

## 2017-10-01 DIAGNOSIS — E569 Vitamin deficiency, unspecified: Secondary | ICD-10-CM | POA: Diagnosis not present

## 2017-10-01 DIAGNOSIS — M6281 Muscle weakness (generalized): Secondary | ICD-10-CM | POA: Diagnosis not present

## 2017-10-01 DIAGNOSIS — R52 Pain, unspecified: Secondary | ICD-10-CM | POA: Diagnosis not present

## 2017-10-01 DIAGNOSIS — Z299 Encounter for prophylactic measures, unspecified: Secondary | ICD-10-CM | POA: Diagnosis not present

## 2017-10-01 DIAGNOSIS — S82855A Nondisplaced trimalleolar fracture of left lower leg, initial encounter for closed fracture: Secondary | ICD-10-CM | POA: Diagnosis not present

## 2017-10-01 DIAGNOSIS — E119 Type 2 diabetes mellitus without complications: Secondary | ICD-10-CM | POA: Diagnosis not present

## 2017-10-01 DIAGNOSIS — S82402D Unspecified fracture of shaft of left fibula, subsequent encounter for closed fracture with routine healing: Secondary | ICD-10-CM | POA: Diagnosis not present

## 2017-10-01 DIAGNOSIS — E785 Hyperlipidemia, unspecified: Secondary | ICD-10-CM | POA: Diagnosis not present

## 2017-10-05 DIAGNOSIS — E785 Hyperlipidemia, unspecified: Secondary | ICD-10-CM | POA: Diagnosis not present

## 2017-10-05 DIAGNOSIS — S82402D Unspecified fracture of shaft of left fibula, subsequent encounter for closed fracture with routine healing: Secondary | ICD-10-CM | POA: Diagnosis not present

## 2017-10-05 DIAGNOSIS — E119 Type 2 diabetes mellitus without complications: Secondary | ICD-10-CM | POA: Diagnosis not present

## 2017-10-05 DIAGNOSIS — E039 Hypothyroidism, unspecified: Secondary | ICD-10-CM | POA: Diagnosis not present

## 2017-10-05 DIAGNOSIS — Z299 Encounter for prophylactic measures, unspecified: Secondary | ICD-10-CM | POA: Diagnosis not present

## 2017-10-05 DIAGNOSIS — M6281 Muscle weakness (generalized): Secondary | ICD-10-CM | POA: Diagnosis not present

## 2017-10-05 DIAGNOSIS — R52 Pain, unspecified: Secondary | ICD-10-CM | POA: Diagnosis not present

## 2017-10-05 DIAGNOSIS — S82855A Nondisplaced trimalleolar fracture of left lower leg, initial encounter for closed fracture: Secondary | ICD-10-CM | POA: Diagnosis not present

## 2017-10-05 DIAGNOSIS — E569 Vitamin deficiency, unspecified: Secondary | ICD-10-CM | POA: Diagnosis not present

## 2017-10-07 DIAGNOSIS — E119 Type 2 diabetes mellitus without complications: Secondary | ICD-10-CM | POA: Diagnosis not present

## 2017-10-07 DIAGNOSIS — E039 Hypothyroidism, unspecified: Secondary | ICD-10-CM | POA: Diagnosis not present

## 2017-10-07 DIAGNOSIS — M6281 Muscle weakness (generalized): Secondary | ICD-10-CM | POA: Diagnosis not present

## 2017-10-07 DIAGNOSIS — E785 Hyperlipidemia, unspecified: Secondary | ICD-10-CM | POA: Diagnosis not present

## 2017-10-07 DIAGNOSIS — S82402D Unspecified fracture of shaft of left fibula, subsequent encounter for closed fracture with routine healing: Secondary | ICD-10-CM | POA: Diagnosis not present

## 2017-10-07 DIAGNOSIS — Z299 Encounter for prophylactic measures, unspecified: Secondary | ICD-10-CM | POA: Diagnosis not present

## 2017-10-07 DIAGNOSIS — R52 Pain, unspecified: Secondary | ICD-10-CM | POA: Diagnosis not present

## 2017-10-07 DIAGNOSIS — E569 Vitamin deficiency, unspecified: Secondary | ICD-10-CM | POA: Diagnosis not present

## 2017-10-07 DIAGNOSIS — S82855A Nondisplaced trimalleolar fracture of left lower leg, initial encounter for closed fracture: Secondary | ICD-10-CM | POA: Diagnosis not present

## 2017-10-08 DIAGNOSIS — S9305XD Dislocation of left ankle joint, subsequent encounter: Secondary | ICD-10-CM | POA: Diagnosis not present

## 2017-10-08 DIAGNOSIS — S82852D Displaced trimalleolar fracture of left lower leg, subsequent encounter for closed fracture with routine healing: Secondary | ICD-10-CM | POA: Diagnosis not present

## 2017-10-14 DIAGNOSIS — M6281 Muscle weakness (generalized): Secondary | ICD-10-CM | POA: Diagnosis not present

## 2017-10-14 DIAGNOSIS — Z299 Encounter for prophylactic measures, unspecified: Secondary | ICD-10-CM | POA: Diagnosis not present

## 2017-10-14 DIAGNOSIS — E785 Hyperlipidemia, unspecified: Secondary | ICD-10-CM | POA: Diagnosis not present

## 2017-10-14 DIAGNOSIS — E039 Hypothyroidism, unspecified: Secondary | ICD-10-CM | POA: Diagnosis not present

## 2017-10-14 DIAGNOSIS — S82855A Nondisplaced trimalleolar fracture of left lower leg, initial encounter for closed fracture: Secondary | ICD-10-CM | POA: Diagnosis not present

## 2017-10-14 DIAGNOSIS — R52 Pain, unspecified: Secondary | ICD-10-CM | POA: Diagnosis not present

## 2017-10-14 DIAGNOSIS — E119 Type 2 diabetes mellitus without complications: Secondary | ICD-10-CM | POA: Diagnosis not present

## 2017-10-14 DIAGNOSIS — E569 Vitamin deficiency, unspecified: Secondary | ICD-10-CM | POA: Diagnosis not present

## 2017-10-14 DIAGNOSIS — S82402D Unspecified fracture of shaft of left fibula, subsequent encounter for closed fracture with routine healing: Secondary | ICD-10-CM | POA: Diagnosis not present

## 2017-11-10 DIAGNOSIS — S9305XD Dislocation of left ankle joint, subsequent encounter: Secondary | ICD-10-CM | POA: Diagnosis not present

## 2017-11-10 DIAGNOSIS — S82852D Displaced trimalleolar fracture of left lower leg, subsequent encounter for closed fracture with routine healing: Secondary | ICD-10-CM | POA: Diagnosis not present

## 2017-11-11 ENCOUNTER — Other Ambulatory Visit: Payer: Self-pay

## 2017-11-11 ENCOUNTER — Ambulatory Visit: Payer: PPO | Attending: Student

## 2017-11-11 DIAGNOSIS — M25572 Pain in left ankle and joints of left foot: Secondary | ICD-10-CM | POA: Diagnosis not present

## 2017-11-11 DIAGNOSIS — R2689 Other abnormalities of gait and mobility: Secondary | ICD-10-CM | POA: Insufficient documentation

## 2017-11-11 DIAGNOSIS — R600 Localized edema: Secondary | ICD-10-CM | POA: Diagnosis not present

## 2017-11-11 DIAGNOSIS — M6281 Muscle weakness (generalized): Secondary | ICD-10-CM

## 2017-11-11 DIAGNOSIS — M25672 Stiffness of left ankle, not elsewhere classified: Secondary | ICD-10-CM | POA: Diagnosis not present

## 2017-11-11 NOTE — Therapy (Signed)
[facility] [address], [place], [place], [address] Phone: [phone]   Fax:  [phone]  Physical Therapy Evaluation  Patient Details  Name: QUINTANA CANELO MRN: [id number] Date of Birth: [birth year] Referring Provider: Katha Hamming, MD   Encounter Date: 11/11/2017  PT End of Session - 11/11/17 1236    Visit Number  1    Date for PT Re-Evaluation  01/06/18    Authorization Type  Medicare    PT Start Time  1146    PT Stop Time  1250    PT Time Calculation (min)  64 min    Activity Tolerance  Patient tolerated treatment well    Behavior During Therapy  [facility] for tasks assessed/performed       Past Medical History:  Diagnosis Date  . Allergy   . Ankle dislocation, left, initial encounter 09/23/2017  . Arthritis   . Blood transfusion    in 1980  . Diabetes mellitus   . Osteoporosis    osteopenia  . Sleep apnea    does not use cpap  . Thyroid disease    hypothyroidism    Past Surgical History:  Procedure Laterality Date  . COLONOSCOPY    . EXTERNAL FIXATION LEG Left 09/24/2017   Procedure: EXTERNAL FIXATION ankle;  Surgeon: Shona Needles, MD;  Location: [facility];  Service: Orthopedics;  Laterality: Left;  . EXTERNAL FIXATION REMOVAL Left 09/28/2017   Procedure: REMOVAL EXTERNAL FIXATION LEG;  Surgeon: Shona Needles, MD;  Location: [place];  Service: Orthopedics;  Laterality: Left;  . LAPAROSCOPIC GASTRIC BANDING    . OPEN REDUCTION INTERNAL FIXATION (ORIF) DISTAL RADIAL FRACTURE Right 07/29/2016   Procedure: RIGHT OPEN REDUCTION INTERNAL FIXATION (ORIF) DISTAL RADIAL FRACTURE;  Surgeon: Leanora Cover, MD;  Location: [facility];  Service: Orthopedics;  Laterality: Right;  . ORIF ANKLE FRACTURE Left 09/28/2017   Procedure: OPEN REDUCTION INTERNAL FIXATION (ORIF) ANKLE FRACTURE;  Surgeon: Shona Needles, MD;  Location: [place];  Service: Orthopedics;  Laterality: Left;  . POLYPECTOMY    . TOTAL  ABDOMINAL HYSTERECTOMY W/ BILATERAL SALPINGOOPHORECTOMY    . WISDOM TOOTH EXTRACTION      There were no vitals filed for this visit.   Subjective Assessment - 11/11/17 1156    Subjective  Pt presents to PT s/p Lt ankle ORIF due to fracture performed 09/28/17.  Pt was NWB until yesterday.  Pt is now WBAT in CAM boot walker.      Pertinent History  Lt ankle ORIF: 09/28/17    How long can you stand comfortably?  Rt>Lt weightbearing: 5-10 minutes    How long can you walk comfortably?  antalgic in boot: not able to assess as pt just started weightbearing yesterday.      Patient Stated Goals  walk without boot, improve Lt ankle strength and A/ROM    Currently in Pain?  Yes    Pain Score  3  up to 8-9/10 at end of day    Pain Location  Ankle    Pain Orientation  Left    Pain Type  Surgical pain    Pain Onset  More than a month ago    Pain Frequency  Constant    Aggravating Factors   moving ankle, standing    Pain Relieving Factors  rest, elevation, ice         [facility] PT Assessment - 11/11/17 0001      Assessment   Medical Diagnosis  Lt ankle fracture (s/p ORIF)    Referring Provider  Katha Hamming, MD    Onset Date/Surgical Date  09/28/17    Next MD Visit  6 weeks      Precautions   Precautions  Other (comment) WBAT in boot    Precaution Comments  WBAT in boot      Restrictions   Weight Bearing Restrictions  Yes    LLE Weight Bearing  Weight bearing as tolerated    Other Position/Activity Restrictions  in boot      Balance Screen   Has the patient fallen in the past 6 months  Yes    How many times?  1 snow storm    Has the patient had a decrease in activity level because of a fear of falling?   No    Is the patient reluctant to leave their home because of a fear of falling?   No      Home Environment   Living Environment  Private residence    Living Arrangements  Alone    Type of [place] to enter    Entrance Stairs-Number of Steps  2    [place]  One level    [place] - 2 wheels;[place] - single point      Prior Function   Level of Independence  Independent    Vocation  Retired    Leisure  play cards every week,       Cognition   Overall Cognitive Status  Within Functional Limits for tasks assessed      Observation/Other Assessments   Focus on Therapeutic Outcomes (FOTO)   63% limitation      Observation/Other Assessments-Edema    Edema  Circumferential      Circumferential Edema   Circumferential - Right  10 inches mid malleolus, 8.25 inches mid foot    Circumferential - Left   mid malleolus 11 inches, mid foot 9 inches      Posture/Postural Control   Posture/Postural Control  No significant limitations      ROM / Strength   AROM / PROM / Strength  AROM;PROM;Strength      AROM   Overall AROM   Deficits    Overall AROM Comments  Rt ankle A/ROM WFLs    AROM Assessment Site  Ankle    Right/Left Ankle  Left    Left Ankle Dorsiflexion  -- lacking 5    Left Ankle Plantar Flexion  35    Left Ankle Inversion  15    Left Ankle Eversion  10      PROM   Overall PROM   Deficits    PROM Assessment Site  Ankle    Right/Left Ankle  Left    Left Ankle Dorsiflexion  0    Left Ankle Plantar Flexion  38    Left Ankle Inversion  20    Left Ankle Eversion  15      Strength   Overall Strength  Unable to assess;Due to precautions    Overall Strength Comments  Bil hip strength 4+/5, Lt knee 4+/5, Rt knee 5/5      Palpation   Palpation comment  well healing surgical incisions with scabbing present, dry skin.  Pitting edema at dorsum of foot.  Global edema-see above measures      Ambulation/Gait   Ambulation/Gait  Yes    Ambulation/Gait Assistance  6: Modified independent (Device/Increase time)  Gait Pattern  Step-through pattern;Antalgic    Ambulation Surface  Level    Gait Comments  wearing boot- antalgic without device             Objective measurements completed on examination: See above  findings.      [facility] Adult PT Treatment/Exercise - 11/11/17 0001      Modalities   Modalities  Vasopneumatic      Vasopneumatic   Number Minutes Vasopneumatic   15 minutes    Vasopnuematic Location   Ankle    Vasopneumatic Pressure  Medium    Vasopneumatic Temperature   3 snowflakes             PT Education - 11/11/17 1235    Education provided  Yes    Education Details  ankle A/ROM    Person(s) Educated  Patient    Methods  Explanation;Demonstration;Handout    Comprehension  Verbalized understanding;Returned demonstration       PT Short Term Goals - 11/11/17 1242      PT SHORT TERM GOAL #1   Title  be independent in initial HEP    Time  4    Period  Weeks    Status  New    Target Date  12/09/17      PT SHORT TERM GOAL #2   Title  demonstrate symmetry with ambulation on level surface wearing CAM boot    Time  4    Period  Weeks    Status  New    Target Date  12/09/17      PT SHORT TERM GOAL #3   Title  report < or = to 5/10 Lt ankle pain with standing and walking    Time  4    Period  Weeks    Status  New    Target Date  12/09/17      PT SHORT TERM GOAL #4   Title  demonstrate active DF on the Lt to 5 degrees to allow for foot clearance when out of boot    Time  4    Period  Weeks    Status  New    Target Date  12/09/17        PT Long Term Goals - 11/11/17 1244      PT LONG TERM GOAL #1   Title  be independent in advanced HEP    Time  8    Period  Weeks    Status  New    Target Date  01/06/18      PT LONG TERM GOAL #2   Title  reduce FOTO to < or = to 50% limitation    Time  8    Period  Weeks    Status  New    Target Date  01/06/18      PT LONG TERM GOAL #3   Title  report < or = to 3/10 Lt ankle pain with standing and walking    Time  8    Period  Weeks    Status  New    Target Date  01/06/18      PT LONG TERM GOAL #4   Title  demonstrate > or = to 4/5 Lt ankle strength to improve stability and safety with gait     Time  8     Period  Weeks    Status  New    Target Date  01/06/18      PT LONG TERM GOAL #5  Title  improve LE strength to walk for 30 minutes in the community without limitation    Time  8    Period  Weeks    Status  New    Target Date  01/06/18             Plan - 11/11/17 1237    Clinical Impression Statement  Pt presents to PT s/p Lt ankle ORIF due to fracture performed 09/28/17.  Pt was non weightbearing until yesterday.  Pt is now WBAT in CAM boot.  Pt demonstrates antalgia with gait on level surfaces.  Pt with well healing surgical incision, global edema and tenderness to palpation over medial and lateral epicondyle.  Pt with limited Lt ankle A/ROM in all directions.  Strength not tested due to post-op precautions.  Pt will benefit from skilled PT for Lt ankle flexibility, strength, proprioception, gait training and edema management.      Clinical Presentation  Stable    Clinical Presentation due to:  expected healing s/p ORIF    Clinical Decision Making  Moderate    Rehab Potential  Good    PT Frequency  2x / week    PT Duration  8 weeks    PT Treatment/Interventions  ADLs/Self Care Home Management;Cryotherapy;Electrical Stimulation;Gait training;Stair training;Functional mobility training;Therapeutic activities;Therapeutic exercise;Balance training;Patient/family education;Neuromuscular re-education;Manual techniques;Scar mobilization;Passive range of motion;Vasopneumatic Device;Taping    PT Next Visit Plan  Lt ankle ROM progression, edema management, seated baps, manual for tissue mobility    Consulted and Agree with Plan of Care  Patient       Patient will benefit from skilled therapeutic intervention in order to improve the following deficits and impairments:  Abnormal gait, Pain, Increased fascial restricitons, Impaired flexibility, Increased edema, Decreased balance, Decreased activity tolerance, Decreased endurance, Decreased range of motion, Decreased strength, Decreased scar  mobility  Visit Diagnosis: Other abnormalities of gait and mobility - Plan: PT plan of care cert/re-cert  Muscle weakness (generalized) - Plan: PT plan of care cert/re-cert  Stiffness of left ankle, not elsewhere classified - Plan: PT plan of care cert/re-cert  Pain in left ankle and joints of left foot - Plan: PT plan of care cert/re-cert  Localized edema     Problem List Patient Active Problem List   Diagnosis Date Noted  . Trimalleolar fracture of ankle, closed, left, initial encounter 09/24/2017  . Tibia/fibula fracture 09/23/2017  . Closed left ankle fracture 09/23/2017  . Ankle dislocation, left, initial encounter 09/23/2017  . Thyroid disease   . Sleep apnea   . Osteoporosis   . Type 2 diabetes mellitus ([place])   . Oth intartic fracture of lower end of right radius, init 07/29/2016  . Anal and rectal polyp 12/16/2013   Sigurd Sos, PT 11/11/17 12:49 PM  [facility] [address] [place], [place], [address] Phone: [phone]   Fax:  [phone]  Name: MERCER STALLWORTH MRN: [id number] Date of Birth: [birth year]

## 2017-11-11 NOTE — Patient Instructions (Signed)
Ankle Circles   Slowly rotate right foot and ankle clockwise then counterclockwise. Gradually increase range of motion. Avoid pain. Circle _10___ times each direction per set. Do _1___ sets per session. Do _4-5___ sessions per day.  http://orth.exer.us/30   Copyright  VHI. All rights reserved.  Long Sitting Bend   In long sitting position, bend and straighten toes. Then bend ankle, moving foot. Then bend and straighten knee. Do the moves to one leg and then the other. Repeat 10____ times. Do 4-5____ sessions per day.  http://gt2.exer.us/647   Copyright  VHI. All rights reserved.  Ankle Bend: Dorsiflexion / Plantar Flexion, Sitting   Sit with feet on floor. Point toes up, keeping both heels on floor. Then press toes to floor raising heels. Hold each position ___ seconds. Repeat _10__ times per session. Do 4-5___ sessions per day.  Copyright  VHI. All rights reserved.  Ankle Sideward Movement (Inversion / Eversion)   Sitting or lying with feet on floor, keep knee still and rock foot onto outer edge. Return to resting position. Now rock foot onto inner edge. Repeat with other foot, then with both feet. Repeat _10___ times. Do __4-5__ sessions per day.  http://gt2.exer.us/405   Copyright  VHI. All rights reserved.   Gastroc / Heel Cord Stretch - Seated With Towel   Sit on floor, towel around ball of foot. Gently pull foot in toward body, stretching heel cord and calf. Hold for _10__ seconds. Repeat on involved leg. Repeat __3-5_ times. Do 4-5___ times per day.  Copyright  VHI. All rights reserved.   [facility] [address] [place], [place] [address] Phone # [phone] Fax [phone]

## 2017-11-13 ENCOUNTER — Ambulatory Visit: Payer: PPO | Attending: Student | Admitting: Physical Therapy

## 2017-11-13 DIAGNOSIS — M25572 Pain in left ankle and joints of left foot: Secondary | ICD-10-CM | POA: Diagnosis not present

## 2017-11-13 DIAGNOSIS — R600 Localized edema: Secondary | ICD-10-CM | POA: Diagnosis not present

## 2017-11-13 DIAGNOSIS — M25672 Stiffness of left ankle, not elsewhere classified: Secondary | ICD-10-CM

## 2017-11-13 DIAGNOSIS — R2689 Other abnormalities of gait and mobility: Secondary | ICD-10-CM | POA: Diagnosis not present

## 2017-11-13 DIAGNOSIS — M6281 Muscle weakness (generalized): Secondary | ICD-10-CM | POA: Insufficient documentation

## 2017-11-13 NOTE — Therapy (Signed)
[facility] [address] [place], [place], [address] Phone: [phone]   Fax:  [phone]  Physical Therapy Treatment  Patient Details  Name: Destiny [name] MRN: [id number] Date of Birth: Jun 23, 1946 Referring Provider: Katha Hamming, MD   Encounter Date: 11/13/2017  PT End of Session - 11/13/17 1108    Visit Number  2    Date for PT Re-Evaluation  01/06/18    Authorization Type  Medicare    PT Start Time  1018    PT Stop Time  1100    PT Time Calculation (min)  42 min    Activity Tolerance  Patient tolerated treatment well    Behavior During Therapy  [facility] for tasks assessed/performed       Past Medical History:  Diagnosis Date  . Allergy   . Ankle dislocation, left, initial encounter 09/23/2017  . Arthritis   . Blood transfusion    in 1980  . Diabetes mellitus   . Osteoporosis    osteopenia  . Sleep apnea    does not use cpap  . Thyroid disease    hypothyroidism    Past Surgical History:  Procedure Laterality Date  . COLONOSCOPY    . EXTERNAL FIXATION LEG Left 09/24/2017   Procedure: EXTERNAL FIXATION ankle;  Surgeon: Shona Needles, MD;  Location: [place];  Service: Orthopedics;  Laterality: Left;  . EXTERNAL FIXATION REMOVAL Left 09/28/2017   Procedure: REMOVAL EXTERNAL FIXATION LEG;  Surgeon: Shona Needles, MD;  Location: [place];  Service: Orthopedics;  Laterality: Left;  . LAPAROSCOPIC GASTRIC BANDING    . OPEN REDUCTION INTERNAL FIXATION (ORIF) DISTAL RADIAL FRACTURE Right 07/29/2016   Procedure: RIGHT OPEN REDUCTION INTERNAL FIXATION (ORIF) DISTAL RADIAL FRACTURE;  Surgeon: Leanora Cover, MD;  Location: [place];  Service: Orthopedics;  Laterality: Right;  . ORIF ANKLE FRACTURE Left 09/28/2017   Procedure: OPEN REDUCTION INTERNAL FIXATION (ORIF) ANKLE FRACTURE;  Surgeon: Shona Needles, MD;  Location: [place];  Service: Orthopedics;  Laterality: Left;  . POLYPECTOMY    . TOTAL  ABDOMINAL HYSTERECTOMY W/ BILATERAL SALPINGOOPHORECTOMY    . WISDOM TOOTH EXTRACTION      There were no vitals filed for this visit.  Subjective Assessment - 11/13/17 1114    Subjective  Pt report she has much worse pain today.  Pt stood to cook for about 30 minutes and came to clinic today without any AD.  She states she put ice on it all night and nothing helped and had to take 2 pain pills.    Pertinent History  Lt ankle ORIF: 09/28/17    Patient Stated Goals  walk without boot, improve Lt ankle strength and A/ROM    Currently in Pain?  Yes    Pain Score  -- did not state number - severe    Pain Location  Ankle    Pain Orientation  Left    Pain Descriptors / Indicators  Sore    Pain Type  Surgical pain    Pain Onset  More than a month ago    Pain Frequency  Constant    Aggravating Factors   walking    Pain Relieving Factors  pain medicine    Multiple Pain Sites  No         [facility] PT Assessment - 11/13/17 0001      Circumferential Edema   Circumferential - Left   mid malleolus 11.75"; mid foot 9.5 inches  [place] Adult PT Treatment/Exercise - 11/13/17 0001      Self-Care   Self-Care  Other Self-Care Comments    Other Self-Care Comments   educated in cryotherapy at home and WBAT with RW to avoid too much too soon      Vasopneumatic   Number Minutes Vasopneumatic   15 minutes    Vasopnuematic Location   Ankle    Vasopneumatic Pressure  Medium    Vasopneumatic Temperature   3 snowflakes      Manual Therapy   Manual Therapy  Edema management;Passive ROM    Manual therapy comments  pt supine with LE elevated    Edema Management  effleurage to left LE around ankle and plantar surface of foot for reduced redness and swelling    Passive ROM  ankle inversion, eversion, dorsiflexion      Ankle Exercises: Stretches   Gastroc Stretch  3 reps;20 seconds in supine with strap      Ankle Exercises: Supine   Other Supine Ankle Exercises  ankle AROM - 20x  each way               PT Short Term Goals - 11/11/17 1242      PT SHORT TERM GOAL #1   Title  be independent in initial HEP    Time  4    Period  Weeks    Status  New    Target Date  12/09/17      PT SHORT TERM GOAL #2   Title  demonstrate symmetry with ambulation on level surface wearing CAM boot    Time  4    Period  Weeks    Status  New    Target Date  12/09/17      PT SHORT TERM GOAL #3   Title  report < or = to 5/10 Lt ankle pain with standing and walking    Time  4    Period  Weeks    Status  New    Target Date  12/09/17      PT SHORT TERM GOAL #4   Title  demonstrate active DF on the Lt to 5 degrees to allow for foot clearance when out of boot    Time  4    Period  Weeks    Status  New    Target Date  12/09/17        PT Long Term Goals - 11/11/17 1244      PT LONG TERM GOAL #1   Title  be independent in advanced HEP    Time  8    Period  Weeks    Status  New    Target Date  01/06/18      PT LONG TERM GOAL #2   Title  reduce FOTO to < or = to 50% limitation    Time  8    Period  Weeks    Status  New    Target Date  01/06/18      PT LONG TERM GOAL #3   Title  report < or = to 3/10 Lt ankle pain with standing and walking    Time  8    Period  Weeks    Status  New    Target Date  01/06/18      PT LONG TERM GOAL #4   Title  demonstrate > or = to 4/5 Lt ankle strength to improve stability and safety with gait     Time  8    Period  Weeks    Status  New    Target Date  01/06/18      PT LONG TERM GOAL #5   Title  improve LE strength to walk for 30 minutes in the community without limitation    Time  8    Period  Weeks    Status  New    Target Date  01/06/18            Plan - 11/13/17 1109    Clinical Impression Statement  Pt presented to clinic with antalgic gait and no AD.  Pt reports increased pain and swelling and needed to increase pain med due last night to pain.  Pt was educated in using RW in order to ease into weight  bearing.  She was educated in using ice for 20 minutes on maximum then 20 minutes off for improved circulation.  Pt responded well to STM and vasopneumatic and had reduced pain, swelling, and redness after treatment.  She was able to tolerate AROM and stretching after manual and modalities.  Pt will benefit from skilled PT to work on strength and continue to progress weight bearing in gait.    PT Treatment/Interventions  ADLs/Self Care Home Management;Cryotherapy;Electrical Stimulation;Gait training;Stair training;Functional mobility training;Therapeutic activities;Therapeutic exercise;Balance training;Patient/family education;Neuromuscular re-education;Manual techniques;Scar mobilization;Passive range of motion;Vasopneumatic Device;Taping    PT Next Visit Plan  Lt ankle ROM progression, edema management, seated baps, manual for tissue mobility    Consulted and Agree with Plan of Care  Patient       Patient will benefit from skilled therapeutic intervention in order to improve the following deficits and impairments:  Abnormal gait, Pain, Increased fascial restricitons, Impaired flexibility, Increased edema, Decreased balance, Decreased activity tolerance, Decreased endurance, Decreased range of motion, Decreased strength, Decreased scar mobility  Visit Diagnosis: Other abnormalities of gait and mobility  Muscle weakness (generalized)  Stiffness of left ankle, not elsewhere classified  Pain in left ankle and joints of left foot  Localized edema     Problem List Patient Active Problem List   Diagnosis Date Noted  . Trimalleolar fracture of ankle, closed, left, initial encounter 09/24/2017  . Tibia/fibula fracture 09/23/2017  . Closed left ankle fracture 09/23/2017  . Ankle dislocation, left, initial encounter 09/23/2017  . Thyroid disease   . Sleep apnea   . Osteoporosis   . Type 2 diabetes mellitus ([place])   . Oth intartic fracture of lower end of right radius, init 07/29/2016  . Anal  and rectal polyp 12/16/2013    Zannie Cove, PT 11/13/2017, 11:16 AM  [facility] [address] [place], [place], [address] Phone: [phone]   Fax:  [phone]  Name: CARLIN MAMONE MRN: [id number] Date of Birth: 1946/06/30

## 2017-11-17 DIAGNOSIS — S82852D Displaced trimalleolar fracture of left lower leg, subsequent encounter for closed fracture with routine healing: Secondary | ICD-10-CM | POA: Diagnosis not present

## 2017-11-17 DIAGNOSIS — S9305XD Dislocation of left ankle joint, subsequent encounter: Secondary | ICD-10-CM | POA: Diagnosis not present

## 2017-11-19 ENCOUNTER — Ambulatory Visit: Payer: PPO | Admitting: Physical Therapy

## 2017-11-19 DIAGNOSIS — H04123 Dry eye syndrome of bilateral lacrimal glands: Secondary | ICD-10-CM | POA: Diagnosis not present

## 2017-11-19 DIAGNOSIS — E119 Type 2 diabetes mellitus without complications: Secondary | ICD-10-CM | POA: Diagnosis not present

## 2017-11-19 DIAGNOSIS — H353131 Nonexudative age-related macular degeneration, bilateral, early dry stage: Secondary | ICD-10-CM | POA: Diagnosis not present

## 2017-11-19 DIAGNOSIS — R2689 Other abnormalities of gait and mobility: Secondary | ICD-10-CM

## 2017-11-19 DIAGNOSIS — H02834 Dermatochalasis of left upper eyelid: Secondary | ICD-10-CM | POA: Diagnosis not present

## 2017-11-19 DIAGNOSIS — H02831 Dermatochalasis of right upper eyelid: Secondary | ICD-10-CM | POA: Diagnosis not present

## 2017-11-19 DIAGNOSIS — Z961 Presence of intraocular lens: Secondary | ICD-10-CM | POA: Diagnosis not present

## 2017-11-19 DIAGNOSIS — H353134 Nonexudative age-related macular degeneration, bilateral, advanced atrophic with subfoveal involvement: Secondary | ICD-10-CM | POA: Diagnosis not present

## 2017-11-19 DIAGNOSIS — M25572 Pain in left ankle and joints of left foot: Secondary | ICD-10-CM

## 2017-11-19 DIAGNOSIS — R600 Localized edema: Secondary | ICD-10-CM

## 2017-11-19 DIAGNOSIS — M6281 Muscle weakness (generalized): Secondary | ICD-10-CM

## 2017-11-19 DIAGNOSIS — M25672 Stiffness of left ankle, not elsewhere classified: Secondary | ICD-10-CM

## 2017-11-19 NOTE — Therapy (Signed)
[facility] [address], [place], [place], [address] Phone: [phone]   Fax:  [phone]  Physical Therapy Treatment  Patient Details  Name: Destiny [name] MRN: [id number] Date of Birth: 01-09-1946 Referring Provider: Katha Hamming, MD   Encounter Date: 11/19/2017  PT End of Session - 11/19/17 1537    Visit Number  3    Date for PT Re-Evaluation  01/06/18    Authorization Type  Medicare    PT Start Time  1533    PT Stop Time  1623    PT Time Calculation (min)  50 min    Activity Tolerance  Patient tolerated treatment well    Behavior During Therapy  [facility] for tasks assessed/performed       Past Medical History:  Diagnosis Date  . Allergy   . Ankle dislocation, left, initial encounter 09/23/2017  . Arthritis   . Blood transfusion    in 1980  . Diabetes mellitus   . Osteoporosis    osteopenia  . Sleep apnea    does not use cpap  . Thyroid disease    hypothyroidism    Past Surgical History:  Procedure Laterality Date  . COLONOSCOPY    . EXTERNAL FIXATION LEG Left 09/24/2017   Procedure: EXTERNAL FIXATION ankle;  Surgeon: Shona Needles, MD;  Location: [place];  Service: Orthopedics;  Laterality: Left;  . EXTERNAL FIXATION REMOVAL Left 09/28/2017   Procedure: REMOVAL EXTERNAL FIXATION LEG;  Surgeon: Shona Needles, MD;  Location: [place];  Service: Orthopedics;  Laterality: Left;  . LAPAROSCOPIC GASTRIC BANDING    . OPEN REDUCTION INTERNAL FIXATION (ORIF) DISTAL RADIAL FRACTURE Right 07/29/2016   Procedure: RIGHT OPEN REDUCTION INTERNAL FIXATION (ORIF) DISTAL RADIAL FRACTURE;  Surgeon: Leanora Cover, MD;  Location: [place];  Service: Orthopedics;  Laterality: Right;  . ORIF ANKLE FRACTURE Left 09/28/2017   Procedure: OPEN REDUCTION INTERNAL FIXATION (ORIF) ANKLE FRACTURE;  Surgeon: Shona Needles, MD;  Location: [place];  Service: Orthopedics;  Laterality: Left;  . POLYPECTOMY    . TOTAL  ABDOMINAL HYSTERECTOMY W/ BILATERAL SALPINGOOPHORECTOMY    . WISDOM TOOTH EXTRACTION      There were no vitals filed for this visit.  Subjective Assessment - 11/19/17 1538    Currently in Pain?  Yes    Pain Score  5     Pain Location  Ankle    Pain Orientation  Left    Pain Descriptors / Indicators  Sore    Pain Type  Surgical pain    Pain Onset  More than a month ago    Aggravating Factors   walking    Pain Relieving Factors  pain medicine and anti-inflammatory    Multiple Pain Sites  No         [facility] PT Assessment - 11/19/17 0001      Circumferential Edema   Circumferential - Left   11.5" mid malleolus; mid foot 9.25"                   [facility] Adult PT Treatment/Exercise - 11/19/17 0001      Vasopneumatic   Number Minutes Vasopneumatic   15 minutes    Vasopnuematic Location   Ankle    Vasopneumatic Pressure  Medium    Vasopneumatic Temperature   3 snowflakes      Manual Therapy   Manual Therapy  Edema management    Manual therapy comments  pt supine with LE elevated  Edema Management  effleurage to left LE around ankle and plantar surface of foot for reduced redness and swelling      Ankle Exercises: Seated   Ankle Circles/Pumps  AROM;Left 5 reps each - educated on importance of doing at home    [employer]  5 marbles side to side on towel    BAPS  Sitting;Level 2 fwd/back and side to side    Other Seated Ankle Exercises  slider for inversion and eversion - 30x each      Ankle Exercises: Supine   Other Supine Ankle Exercises  DF and PF - yellow band  30x      Ankle Exercises: Stretches   Gastroc Stretch  1 rep;20 seconds demonstrated and educated to add to [employer]              PT Education - 11/19/17 1612    Education provided  Yes    Education Details  gastroc stretch with towel    Person(s) Educated  Patient    Methods  Explanation;Handout;Tactile cues;Verbal cues;Demonstration    Comprehension  Verbalized understanding       PT Short  Term Goals - 11/19/17 1615      PT SHORT TERM GOAL #1   Title  be independent in initial HEP    Time  4    Period  Weeks    Status  On-going      PT SHORT TERM GOAL #2   Title  demonstrate symmetry with ambulation on level surface wearing CAM boot    Baseline  needs RW for WBAT    Time  4    Period  Weeks    Status  On-going      PT SHORT TERM GOAL #3   Title  report < or = to 5/10 Lt ankle pain with standing and walking    Time  4    Period  Weeks    Status  On-going      PT SHORT TERM GOAL #4   Title  demonstrate active DF on the Lt to 5 degrees to allow for foot clearance when out of boot    Time  4    Period  Weeks    Status  On-going        PT Long Term Goals - 11/11/17 1244      PT LONG TERM GOAL #1   Title  be independent in advanced HEP    Time  8    Period  Weeks    Status  New    Target Date  01/06/18      PT LONG TERM GOAL #2   Title  reduce FOTO to < or = to 50% limitation    Time  8    Period  Weeks    Status  New    Target Date  01/06/18      PT LONG TERM GOAL #3   Title  report < or = to 3/10 Lt ankle pain with standing and walking    Time  8    Period  Weeks    Status  New    Target Date  01/06/18      PT LONG TERM GOAL #4   Title  demonstrate > or = to 4/5 Lt ankle strength to improve stability and safety with gait     Time  8    Period  Weeks    Status  New    Target Date  01/06/18  PT LONG TERM GOAL #5   Title  improve LE strength to walk for 30 minutes in the community without limitation    Time  8    Period  Weeks    Status  New    Target Date  01/06/18            Plan - 11/19/17 1613    Clinical Impression Statement  Pt had less swelling than she did at previous treatment.  She came to treatment using RW today.  She was able to tolerate exercises today without increased pain.  Pt will benefit from continued skilled PT for strength and ROM in order to progress back to walking.      PT Treatment/Interventions   ADLs/Self Care Home Management;Cryotherapy;Electrical Stimulation;Gait training;Stair training;Functional mobility training;Therapeutic activities;Therapeutic exercise;Balance training;Patient/family education;Neuromuscular re-education;Manual techniques;Scar mobilization;Passive range of motion;Vasopneumatic Device;Taping    PT Next Visit Plan  Lt ankle ROM progression, strengtening as tolerated, edema management, seated baps, manual for tissue mobility    Consulted and Agree with Plan of Care  Patient       Patient will benefit from skilled therapeutic intervention in order to improve the following deficits and impairments:  Abnormal gait, Pain, Increased fascial restricitons, Impaired flexibility, Increased edema, Decreased balance, Decreased activity tolerance, Decreased endurance, Decreased range of motion, Decreased strength, Decreased scar mobility  Visit Diagnosis: Other abnormalities of gait and mobility  Muscle weakness (generalized)  Stiffness of left ankle, not elsewhere classified  Pain in left ankle and joints of left foot  Localized edema     Problem List Patient Active Problem List   Diagnosis Date Noted  . Trimalleolar fracture of ankle, closed, left, initial encounter 09/24/2017  . Tibia/fibula fracture 09/23/2017  . Closed left ankle fracture 09/23/2017  . Ankle dislocation, left, initial encounter 09/23/2017  . Thyroid disease   . Sleep apnea   . Osteoporosis   . Type 2 diabetes mellitus ([place])   . Oth intartic fracture of lower end of right radius, init 07/29/2016  . Anal and rectal polyp 12/16/2013    Zannie Cove, PT 11/19/2017, 4:21 PM  [facility] [address], [place], [place], [address] Phone: [phone]   Fax:  [phone]  Name: GLENDIA OLSHEFSKI MRN: [id number] Date of Birth: 1946/06/08

## 2017-11-19 NOTE — Patient Instructions (Signed)
Gastroc / Heel Cord Stretch - Seated With Towel    Sit on floor, towel around ball of foot. Gently pull foot in toward body, stretching heel cord and calf. Hold for 30___ seconds. Repeat on involved leg. Repeat _3__ times. Do _1__ times per day.  Copyright  VHI. All rights reserved.   [place] [address], [place], [place] [address] Phone # [phone] Fax [phone]

## 2017-11-24 ENCOUNTER — Encounter: Payer: Self-pay | Admitting: Physical Therapy

## 2017-11-24 ENCOUNTER — Ambulatory Visit: Payer: PPO | Admitting: Physical Therapy

## 2017-11-24 DIAGNOSIS — R2689 Other abnormalities of gait and mobility: Secondary | ICD-10-CM

## 2017-11-24 DIAGNOSIS — R600 Localized edema: Secondary | ICD-10-CM

## 2017-11-24 DIAGNOSIS — M25672 Stiffness of left ankle, not elsewhere classified: Secondary | ICD-10-CM

## 2017-11-24 DIAGNOSIS — M25572 Pain in left ankle and joints of left foot: Secondary | ICD-10-CM

## 2017-11-24 DIAGNOSIS — M6281 Muscle weakness (generalized): Secondary | ICD-10-CM

## 2017-11-24 NOTE — Therapy (Signed)
[facility] [address], [place], [place], [address] Phone: [phone]   Fax:  [phone]  Physical Therapy Treatment  Patient Details  Name: Destiny [name] MRN: [id number] Date of Birth: 25-Jan-1946 Referring Provider: Katha Hamming, MD   Encounter Date: 11/24/2017  PT End of Session - 11/24/17 1229    Visit Number  4    Date for PT Re-Evaluation  01/06/18    Authorization Type  Medicare    PT Start Time  1227    PT Stop Time  1318    PT Time Calculation (min)  51 min    Activity Tolerance  Patient tolerated treatment well    Behavior During Therapy  [facility] for tasks assessed/performed       Past Medical History:  Diagnosis Date  . Allergy   . Ankle dislocation, left, initial encounter 09/23/2017  . Arthritis   . Blood transfusion    in 1980  . Diabetes mellitus   . Osteoporosis    osteopenia  . Sleep apnea    does not use cpap  . Thyroid disease    hypothyroidism    Past Surgical History:  Procedure Laterality Date  . COLONOSCOPY    . EXTERNAL FIXATION LEG Left 09/24/2017   Procedure: EXTERNAL FIXATION ankle;  Surgeon: Shona Needles, MD;  Location: [place];  Service: Orthopedics;  Laterality: Left;  . EXTERNAL FIXATION REMOVAL Left 09/28/2017   Procedure: REMOVAL EXTERNAL FIXATION LEG;  Surgeon: Shona Needles, MD;  Location: [place];  Service: Orthopedics;  Laterality: Left;  . LAPAROSCOPIC GASTRIC BANDING    . OPEN REDUCTION INTERNAL FIXATION (ORIF) DISTAL RADIAL FRACTURE Right 07/29/2016   Procedure: RIGHT OPEN REDUCTION INTERNAL FIXATION (ORIF) DISTAL RADIAL FRACTURE;  Surgeon: Leanora Cover, MD;  Location: [place];  Service: Orthopedics;  Laterality: Right;  . ORIF ANKLE FRACTURE Left 09/28/2017   Procedure: OPEN REDUCTION INTERNAL FIXATION (ORIF) ANKLE FRACTURE;  Surgeon: Shona Needles, MD;  Location: [place];  Service: Orthopedics;  Laterality: Left;  . POLYPECTOMY    . TOTAL  ABDOMINAL HYSTERECTOMY W/ BILATERAL SALPINGOOPHORECTOMY    . WISDOM TOOTH EXTRACTION      There were no vitals filed for this visit.  Subjective Assessment - 11/24/17 1236    Subjective  Patient states she has been walking without the crutches or cane, but is feeling much better after the anti-inflammatory medicine    Pertinent History  Lt ankle ORIF: 09/28/17    How long can you stand comfortably?  Rt>Lt weightbearing: 5-10 minutes    Currently in Pain?  Yes    Pain Score  2     Pain Location  Ankle    Pain Orientation  Left    Pain Descriptors / Indicators  Sore    Pain Type  Surgical pain    Pain Onset  More than a month ago    Pain Frequency  Constant    Aggravating Factors   walking and standing    Pain Relieving Factors  anti-inflammatory                      [facility] Adult PT Treatment/Exercise - 11/24/17 0001      Knee/Hip Exercises: Aerobic   Nustep  L1 x 8 min PT present for status update      Knee/Hip Exercises: Seated   Long Arc Quad  Strengthening;Left;10 reps    Long Arc Quad Weight  2 lbs.  Vasopneumatic   Number Minutes Vasopneumatic   15 minutes    Vasopnuematic Location   Ankle    Vasopneumatic Pressure  Medium    Vasopneumatic Temperature   3 snowflakes      Ankle Exercises: Seated   BAPS  Sitting;Level 2 fwd/back and side to side; circles both ways    Other Seated Ankle Exercises  DF and PF; red band 25-30 reps      Ankle Exercises: Stretches   Gastroc Stretch  3 reps;30 seconds               PT Short Term Goals - 11/19/17 1615      PT SHORT TERM GOAL #1   Title  be independent in initial HEP    Time  4    Period  Weeks    Status  On-going      PT SHORT TERM GOAL #2   Title  demonstrate symmetry with ambulation on level surface wearing CAM boot    Baseline  needs RW for WBAT    Time  4    Period  Weeks    Status  On-going      PT SHORT TERM GOAL #3   Title  report < or = to 5/10 Lt ankle pain with standing and  walking    Time  4    Period  Weeks    Status  On-going      PT SHORT TERM GOAL #4   Title  demonstrate active DF on the Lt to 5 degrees to allow for foot clearance when out of boot    Time  4    Period  Weeks    Status  On-going        PT Long Term Goals - 11/11/17 1244      PT LONG TERM GOAL #1   Title  be independent in advanced HEP    Time  8    Period  Weeks    Status  New    Target Date  01/06/18      PT LONG TERM GOAL #2   Title  reduce FOTO to < or = to 50% limitation    Time  8    Period  Weeks    Status  New    Target Date  01/06/18      PT LONG TERM GOAL #3   Title  report < or = to 3/10 Lt ankle pain with standing and walking    Time  8    Period  Weeks    Status  New    Target Date  01/06/18      PT LONG TERM GOAL #4   Title  demonstrate > or = to 4/5 Lt ankle strength to improve stability and safety with gait     Time  8    Period  Weeks    Status  New    Target Date  01/06/18      PT LONG TERM GOAL #5   Title  improve LE strength to walk for 30 minutes in the community without limitation    Time  8    Period  Weeks    Status  New    Target Date  01/06/18            Plan - 11/24/17 1231    Clinical Impression Statement  Pt did well on nustep for LE strengthening and increased WB through left limb.  Pt fatigues quickly with exercises  and began to have some increased swelling.  Pt will benefit from skilled PT to work on LE strength in order for her to return to WB in left LE and increased activity tolerance.    Rehab Potential  Good    PT Treatment/Interventions  ADLs/Self Care Home Management;Cryotherapy;Electrical Stimulation;Gait training;Stair training;Functional mobility training;Therapeutic activities;Therapeutic exercise;Balance training;Patient/family education;Neuromuscular re-education;Manual techniques;Scar mobilization;Passive range of motion;Vasopneumatic Device;Taping    PT Next Visit Plan  Lt ankle ROM progression, strengtening  as tolerated, edema management, seated baps, manual for tissue mobility    Consulted and Agree with Plan of Care  Patient       Patient will benefit from skilled therapeutic intervention in order to improve the following deficits and impairments:  Abnormal gait, Pain, Increased fascial restricitons, Impaired flexibility, Increased edema, Decreased balance, Decreased activity tolerance, Decreased endurance, Decreased range of motion, Decreased strength, Decreased scar mobility  Visit Diagnosis: Other abnormalities of gait and mobility  Muscle weakness (generalized)  Stiffness of left ankle, not elsewhere classified  Pain in left ankle and joints of left foot  Localized edema     Problem List Patient Active Problem List   Diagnosis Date Noted  . Trimalleolar fracture of ankle, closed, left, initial encounter 09/24/2017  . Tibia/fibula fracture 09/23/2017  . Closed left ankle fracture 09/23/2017  . Ankle dislocation, left, initial encounter 09/23/2017  . Thyroid disease   . Sleep apnea   . Osteoporosis   . Type 2 diabetes mellitus ([place])   . Oth intartic fracture of lower end of right radius, init 07/29/2016  . Anal and rectal polyp 12/16/2013    Zannie Cove, PT 11/24/2017, 1:12 PM  [facility] [address] [place], [place], [address] Phone: [phone]   Fax:  [phone]  Name: Destiny [name] MRN: [id number] Date of Birth: 12/20/1945

## 2017-11-26 ENCOUNTER — Ambulatory Visit: Payer: PPO

## 2017-11-26 DIAGNOSIS — R600 Localized edema: Secondary | ICD-10-CM

## 2017-11-26 DIAGNOSIS — M6281 Muscle weakness (generalized): Secondary | ICD-10-CM

## 2017-11-26 DIAGNOSIS — M25672 Stiffness of left ankle, not elsewhere classified: Secondary | ICD-10-CM

## 2017-11-26 DIAGNOSIS — M25572 Pain in left ankle and joints of left foot: Secondary | ICD-10-CM

## 2017-11-26 DIAGNOSIS — R2689 Other abnormalities of gait and mobility: Secondary | ICD-10-CM | POA: Diagnosis not present

## 2017-11-26 NOTE — Patient Instructions (Addendum)
   Inversion: Resisted   Cross legs with right leg underneath, foot in tubing loop. Hold tubing around other foot to resist and turn foot in. Repeat ____ times per set. Do ____ sets per session. Do ____ sessions per day.  http://orth.exer.us/12   Copyright  VHI. All rights reserved.  Eversion: Resisted   With right foot in tubing loop, hold tubing around other foot to resist and turn foot out. Repeat ____ times per set. Do ____ sets per session. Do ____ sessions per day.  http://orth.exer.us/14   Copyright  VHI. All rights reserved.  Plantar Flexion: Resisted   Anchor behind, tubing around left foot, press down. Repeat ____ times per set. Do ____ sets per session. Do ____ sessions per day.  http://orth.exer.us/10   Copyright  VHI. All rights reserved.  Dorsiflexion: Resisted   Facing anchor, tubing around left foot, pull toward face.  Repeat ____ times per set. Do ____ sets per session. Do ____ sessions per day.  http://orth.exer.us/8   Do 1-2x/day, 10-20 reps each  Copyright  VHI. All rights reserved.  [place] [address], [place], [place] [address] Phone # [phone] Fax [phone]

## 2017-11-26 NOTE — Therapy (Signed)
[facility] [address], [place], [place], [address] Phone: [phone]   Fax:  [phone]  Physical Therapy Treatment  Patient Details  Name: Destiny [name] MRN: [id number] Date of Birth: [birth year] Referring Provider: Katha Hamming, MD   Encounter Date: 11/26/2017  PT End of Session - 11/26/17 1316    Visit Number  5    Date for PT Re-Evaluation  01/06/18    Authorization Type  Medicare    PT Start Time  1225    PT Stop Time  1327    PT Time Calculation (min)  62 min    Activity Tolerance  Patient tolerated treatment well    Behavior During Therapy  [facility] for tasks assessed/performed       Past Medical History:  Diagnosis Date  . Allergy   . Ankle dislocation, left, initial encounter 09/23/2017  . Arthritis   . Blood transfusion    in 1980  . Diabetes mellitus   . Osteoporosis    osteopenia  . Sleep apnea    does not use cpap  . Thyroid disease    hypothyroidism    Past Surgical History:  Procedure Laterality Date  . COLONOSCOPY    . EXTERNAL FIXATION LEG Left 09/24/2017   Procedure: EXTERNAL FIXATION ankle;  Surgeon: Shona Needles, MD;  Location: [place];  Service: Orthopedics;  Laterality: Left;  . EXTERNAL FIXATION REMOVAL Left 09/28/2017   Procedure: REMOVAL EXTERNAL FIXATION LEG;  Surgeon: Shona Needles, MD;  Location: [place];  Service: Orthopedics;  Laterality: Left;  . LAPAROSCOPIC GASTRIC BANDING    . OPEN REDUCTION INTERNAL FIXATION (ORIF) DISTAL RADIAL FRACTURE Right 07/29/2016   Procedure: RIGHT OPEN REDUCTION INTERNAL FIXATION (ORIF) DISTAL RADIAL FRACTURE;  Surgeon: Leanora Cover, MD;  Location: [place];  Service: Orthopedics;  Laterality: Right;  . ORIF ANKLE FRACTURE Left 09/28/2017   Procedure: OPEN REDUCTION INTERNAL FIXATION (ORIF) ANKLE FRACTURE;  Surgeon: Shona Needles, MD;  Location: [place];  Service: Orthopedics;  Laterality: Left;  . POLYPECTOMY    . TOTAL  ABDOMINAL HYSTERECTOMY W/ BILATERAL SALPINGOOPHORECTOMY    . WISDOM TOOTH EXTRACTION      There were no vitals filed for this visit.  Subjective Assessment - 11/26/17 1227    Subjective  I try not to put weight on my Lt leg as much as home.      Patient Stated Goals  walk without boot, improve Lt ankle strength and A/ROM    Currently in Pain?  Yes    Pain Score  1     Pain Location  Ankle    Pain Orientation  Left                      [facility] Adult PT Treatment/Exercise - 11/26/17 0001      Knee/Hip Exercises: Aerobic   Nustep  L1 x 8 min PT present for status update- no boot      Knee/Hip Exercises: Seated   Long Arc Quad  Strengthening;Left;10 reps    Long Arc Quad Weight  2 lbs.      Vasopneumatic   Number Minutes Vasopneumatic   10 minutes    Vasopnuematic Location   Ankle    Vasopneumatic Pressure  Medium    Vasopneumatic Temperature   3 snowflakes      Manual Therapy   Manual Therapy  Edema management    Manual therapy comments  soft tissue and scar massage  to Lt ankle      Ankle Exercises: Seated   BAPS  Sitting;Level 2 fwd/back and side to side; circles both ways    Other Seated Ankle Exercises  DF and PF; red band 25-30 reps      Ankle Exercises: Supine   Other Supine Ankle Exercises  DF and PF, inversion and eversion - red band  30x             PT Education - 11/26/17 1304    Education provided  Yes    Education Details  ankle 4 way with band    Person(s) Educated  Patient    Methods  Explanation;Demonstration;Handout;Tactile cues    Comprehension  Verbalized understanding;Returned demonstration       PT Short Term Goals - 11/26/17 1233      PT SHORT TERM GOAL #1   Title  be independent in initial HEP    Status  Achieved        PT Long Term Goals - 11/11/17 1244      PT LONG TERM GOAL #1   Title  be independent in advanced HEP    Time  8    Period  Weeks    Status  New    Target Date  01/06/18      PT LONG TERM GOAL #2    Title  reduce FOTO to < or = to 50% limitation    Time  8    Period  Weeks    Status  New    Target Date  01/06/18      PT LONG TERM GOAL #3   Title  report < or = to 3/10 Lt ankle pain with standing and walking    Time  8    Period  Weeks    Status  New    Target Date  01/06/18      PT LONG TERM GOAL #4   Title  demonstrate > or = to 4/5 Lt ankle strength to improve stability and safety with gait     Time  8    Period  Weeks    Status  New    Target Date  01/06/18      PT LONG TERM GOAL #5   Title  improve LE strength to walk for 30 minutes in the community without limitation    Time  8    Period  Weeks    Status  New    Target Date  01/06/18            Plan - 11/26/17 1233    Clinical Impression Statement  Pt continues to ambulate with antalgia and reduced time spent on Lt in stance.  PT provided gait training with use of cane on the Rt to allow for improved symmetry when she uses her crutch at home.  Pt with weakness of the Lt LE and fatigues quickly due to reduced endurance.  Pt requires tactile cues for Baps board today.  Pt will continue to benefit from skilled PT for Lt ankle flexibility, ROM proprioception and strength to improve gait and allow for improved endurance with standing activity.      Clinical Presentation  Stable    Clinical Decision Making  Moderate    Rehab Potential  Good    PT Frequency  2x / week    PT Duration  8 weeks    PT Treatment/Interventions  ADLs/Self Care Home Management;Cryotherapy;Electrical Stimulation;Gait training;Stair training;Functional mobility training;Therapeutic activities;Therapeutic exercise;Balance training;Patient/family education;Neuromuscular  re-education;Manual techniques;Scar mobilization;Passive range of motion;Vasopneumatic Device;Taping    PT Next Visit Plan  Lt ankle ROM progression, strengtening as tolerated, edema management, seated baps, manual for tissue mobility.  weight shifting and sit to stand with boot     Recommended Other Services  initial certification is signed    Consulted and Agree with Plan of Care  Patient       Patient will benefit from skilled therapeutic intervention in order to improve the following deficits and impairments:  Abnormal gait, Pain, Increased fascial restricitons, Impaired flexibility, Increased edema, Decreased balance, Decreased activity tolerance, Decreased endurance, Decreased range of motion, Decreased strength, Decreased scar mobility  Visit Diagnosis: Other abnormalities of gait and mobility  Muscle weakness (generalized)  Stiffness of left ankle, not elsewhere classified  Pain in left ankle and joints of left foot  Localized edema     Problem List Patient Active Problem List   Diagnosis Date Noted  . Trimalleolar fracture of ankle, closed, left, initial encounter 09/24/2017  . Tibia/fibula fracture 09/23/2017  . Closed left ankle fracture 09/23/2017  . Ankle dislocation, left, initial encounter 09/23/2017  . Thyroid disease   . Sleep apnea   . Osteoporosis   . Type 2 diabetes mellitus ([place])   . Oth intartic fracture of lower end of right radius, init 07/29/2016  . Anal and rectal polyp 12/16/2013     Sigurd Sos, PT 11/26/17 1:20 PM  [facility] [address] [place], [place], [address] Phone: [phone]   Fax:  [phone]  Name: Destiny [name] MRN: [id number] Date of Birth: Sep 16, 1946

## 2017-12-01 ENCOUNTER — Ambulatory Visit: Payer: PPO

## 2017-12-01 DIAGNOSIS — R600 Localized edema: Secondary | ICD-10-CM

## 2017-12-01 DIAGNOSIS — M25572 Pain in left ankle and joints of left foot: Secondary | ICD-10-CM

## 2017-12-01 DIAGNOSIS — M25672 Stiffness of left ankle, not elsewhere classified: Secondary | ICD-10-CM

## 2017-12-01 DIAGNOSIS — R2689 Other abnormalities of gait and mobility: Secondary | ICD-10-CM | POA: Diagnosis not present

## 2017-12-01 DIAGNOSIS — M6281 Muscle weakness (generalized): Secondary | ICD-10-CM

## 2017-12-01 NOTE — Therapy (Signed)
[facility] [address], [place], [place], [address] Phone: [phone]   Fax:  [phone]  Physical Therapy Treatment  Patient Details  Name: Destiny [name] MRN: [id number] Date of Birth: [birth year] Referring Provider: Katha Hamming, MD   Encounter Date: 12/01/2017  PT End of Session - 12/01/17 1142    Visit Number  6    Date for PT Re-Evaluation  01/06/18    Authorization Type  Medicare    PT Start Time  1100    PT Stop Time  1157    PT Time Calculation (min)  57 min    Activity Tolerance  Patient tolerated treatment well    Behavior During Therapy  [facility] for tasks assessed/performed       Past Medical History:  Diagnosis Date  . Allergy   . Ankle dislocation, left, initial encounter 09/23/2017  . Arthritis   . Blood transfusion    in 1980  . Diabetes mellitus   . Osteoporosis    osteopenia  . Sleep apnea    does not use cpap  . Thyroid disease    hypothyroidism    Past Surgical History:  Procedure Laterality Date  . COLONOSCOPY    . EXTERNAL FIXATION LEG Left 09/24/2017   Procedure: EXTERNAL FIXATION ankle;  Surgeon: Shona Needles, MD;  Location: [place];  Service: Orthopedics;  Laterality: Left;  . EXTERNAL FIXATION REMOVAL Left 09/28/2017   Procedure: REMOVAL EXTERNAL FIXATION LEG;  Surgeon: Shona Needles, MD;  Location: [place];  Service: Orthopedics;  Laterality: Left;  . LAPAROSCOPIC GASTRIC BANDING    . OPEN REDUCTION INTERNAL FIXATION (ORIF) DISTAL RADIAL FRACTURE Right 07/29/2016   Procedure: RIGHT OPEN REDUCTION INTERNAL FIXATION (ORIF) DISTAL RADIAL FRACTURE;  Surgeon: Leanora Cover, MD;  Location: [place];  Service: Orthopedics;  Laterality: Right;  . ORIF ANKLE FRACTURE Left 09/28/2017   Procedure: OPEN REDUCTION INTERNAL FIXATION (ORIF) ANKLE FRACTURE;  Surgeon: Shona Needles, MD;  Location: [place];  Service: Orthopedics;  Laterality: Left;  . POLYPECTOMY    . TOTAL  ABDOMINAL HYSTERECTOMY W/ BILATERAL SALPINGOOPHORECTOMY    . WISDOM TOOTH EXTRACTION      There were no vitals filed for this visit.  Subjective Assessment - 12/01/17 1103    Subjective  I did a lot on my feet over the weekend so increased fatigue and edema.      Currently in Pain?  No/denies                      [facility] Adult PT Treatment/Exercise - 12/01/17 0001      Knee/Hip Exercises: Aerobic   Nustep  L1 x 8 min PT present for status update- no boot      Knee/Hip Exercises: Supine   Straight Leg Raises  Strengthening;Left;2 sets;10 reps    Other Supine Knee/Hip Exercises  sidelying hip abduction: 2x10      Vasopneumatic   Number Minutes Vasopneumatic   15 minutes    Vasopnuematic Location   Ankle    Vasopneumatic Pressure  Medium    Vasopneumatic Temperature   3 snowflakes      Ankle Exercises: Seated   BAPS  Sitting;Level 2 fwd/back and side to side; circles both ways    Other Seated Ankle Exercises  Prostretch x 3 minutes    Other Seated Ankle Exercises  DF and PF; red band 25-30 reps  PT Short Term Goals - 12/01/17 1110      PT SHORT TERM GOAL #2   Title  demonstrate symmetry with ambulation on level surface wearing CAM boot    Baseline  using cane, antalgia with reduced time spent in stance on Lt    Time  4    Period  Weeks    Status  On-going      PT SHORT TERM GOAL #3   Title  report < or = to 5/10 Lt ankle pain with standing and walking    Baseline  up to 7/10 with increased activity    Time  4    Period  Weeks    Status  On-going        PT Long Term Goals - 11/11/17 1244      PT LONG TERM GOAL #1   Title  be independent in advanced HEP    Time  8    Period  Weeks    Status  New    Target Date  01/06/18      PT LONG TERM GOAL #2   Title  reduce FOTO to < or = to 50% limitation    Time  8    Period  Weeks    Status  New    Target Date  01/06/18      PT LONG TERM GOAL #3   Title  report < or = to 3/10 Lt  ankle pain with standing and walking    Time  8    Period  Weeks    Status  New    Target Date  01/06/18      PT LONG TERM GOAL #4   Title  demonstrate > or = to 4/5 Lt ankle strength to improve stability and safety with gait     Time  8    Period  Weeks    Status  New    Target Date  01/06/18      PT LONG TERM GOAL #5   Title  improve LE strength to walk for 30 minutes in the community without limitation    Time  8    Period  Weeks    Status  New    Target Date  01/06/18            Plan - 12/01/17 1111    Clinical Impression Statement  Pt continues to ambulate with antalgia with reduced time spent in stance on the Lt.  Pt with improved use of cane today with better technique.  Pt with incresaed pain yesterday and more edema reported due to increased time spent in standing.  Pt with reduced Lt LE strength and endurance due to non weight bearing after surgery.  Pt requires tactile cues for use of Baps in sitting today.  Pt will continue to benefit from skilled PT for LT ankle strength, proprioception and flexibility.      Rehab Potential  Good    PT Frequency  2x / week    PT Duration  8 weeks    PT Treatment/Interventions  ADLs/Self Care Home Management;Cryotherapy;Electrical Stimulation;Gait training;Stair training;Functional mobility training;Therapeutic activities;Therapeutic exercise;Balance training;Patient/family education;Neuromuscular re-education;Manual techniques;Scar mobilization;Passive range of motion;Vasopneumatic Device;Taping    PT Next Visit Plan  Lt ankle ROM progression, strengtening as tolerated, edema management, seated baps, manual for tissue mobility.  weight shifting and sit to stand with boot    Consulted and Agree with Plan of Care  Patient       Patient  will benefit from skilled therapeutic intervention in order to improve the following deficits and impairments:  Abnormal gait, Pain, Increased fascial restricitons, Impaired flexibility, Increased  edema, Decreased balance, Decreased activity tolerance, Decreased endurance, Decreased range of motion, Decreased strength, Decreased scar mobility  Visit Diagnosis: Other abnormalities of gait and mobility  Muscle weakness (generalized)  Stiffness of left ankle, not elsewhere classified  Pain in left ankle and joints of left foot  Localized edema     Problem List Patient Active Problem List   Diagnosis Date Noted  . Trimalleolar fracture of ankle, closed, left, initial encounter 09/24/2017  . Tibia/fibula fracture 09/23/2017  . Closed left ankle fracture 09/23/2017  . Ankle dislocation, left, initial encounter 09/23/2017  . Thyroid disease   . Sleep apnea   . Osteoporosis   . Type 2 diabetes mellitus ([place])   . Oth intartic fracture of lower end of right radius, init 07/29/2016  . Anal and rectal polyp 12/16/2013     Sigurd Sos, PT 12/01/17 11:44 AM  [facility] [address], [place], [place], [address] Phone: [phone]   Fax:  [phone]  Name: Destiny [name] MRN: [id number] Date of Birth: [birth year]

## 2017-12-03 ENCOUNTER — Ambulatory Visit: Payer: PPO

## 2017-12-03 DIAGNOSIS — R2689 Other abnormalities of gait and mobility: Secondary | ICD-10-CM

## 2017-12-03 DIAGNOSIS — M25672 Stiffness of left ankle, not elsewhere classified: Secondary | ICD-10-CM

## 2017-12-03 DIAGNOSIS — M6281 Muscle weakness (generalized): Secondary | ICD-10-CM

## 2017-12-03 DIAGNOSIS — M25572 Pain in left ankle and joints of left foot: Secondary | ICD-10-CM

## 2017-12-03 DIAGNOSIS — R600 Localized edema: Secondary | ICD-10-CM

## 2017-12-03 NOTE — Therapy (Signed)
[facility] [address] [place], [place], [address] Phone: [phone]   Fax:  [phone]  Physical Therapy Treatment  Patient Details  Name: Destiny [name] MRN: [id number] Date of Birth: May 16, 1946 Referring Provider: Katha Hamming, MD   Encounter Date: 12/03/2017  PT End of Session - 12/03/17 1118    Visit Number  7    Date for PT Re-Evaluation  01/06/18    Authorization Type  Medicare    Authorization Time Period  KX at 34    PT Start Time  1021    PT Stop Time  1132    PT Time Calculation (min)  71 min    Activity Tolerance  Patient tolerated treatment well    Behavior During Therapy  [facility] for tasks assessed/performed       Past Medical History:  Diagnosis Date  . Allergy   . Ankle dislocation, left, initial encounter 09/23/2017  . Arthritis   . Blood transfusion    in 1980  . Diabetes mellitus   . Osteoporosis    osteopenia  . Sleep apnea    does not use cpap  . Thyroid disease    hypothyroidism    Past Surgical History:  Procedure Laterality Date  . COLONOSCOPY    . EXTERNAL FIXATION LEG Left 09/24/2017   Procedure: EXTERNAL FIXATION ankle;  Surgeon: Shona Needles, MD;  Location: [place];  Service: Orthopedics;  Laterality: Left;  . EXTERNAL FIXATION REMOVAL Left 09/28/2017   Procedure: REMOVAL EXTERNAL FIXATION LEG;  Surgeon: Shona Needles, MD;  Location: [place];  Service: Orthopedics;  Laterality: Left;  . LAPAROSCOPIC GASTRIC BANDING    . OPEN REDUCTION INTERNAL FIXATION (ORIF) DISTAL RADIAL FRACTURE Right 07/29/2016   Procedure: RIGHT OPEN REDUCTION INTERNAL FIXATION (ORIF) DISTAL RADIAL FRACTURE;  Surgeon: Leanora Cover, MD;  Location: [place];  Service: Orthopedics;  Laterality: Right;  . ORIF ANKLE FRACTURE Left 09/28/2017   Procedure: OPEN REDUCTION INTERNAL FIXATION (ORIF) ANKLE FRACTURE;  Surgeon: Shona Needles, MD;  Location: [place];  Service: Orthopedics;   Laterality: Left;  . POLYPECTOMY    . TOTAL ABDOMINAL HYSTERECTOMY W/ BILATERAL SALPINGOOPHORECTOMY    . WISDOM TOOTH EXTRACTION      There were no vitals filed for this visit.  Subjective Assessment - 12/03/17 1028    Subjective  I was stiff and sore when I woke up this morning.      Pertinent History  Lt ankle ORIF: 09/28/17    Currently in Pain?  Yes    Pain Score  2     Pain Location  Ankle    Pain Orientation  Left    Pain Descriptors / Indicators  Tightness;Aching                      [facility] Adult PT Treatment/Exercise - 12/03/17 0001      Knee/Hip Exercises: Standing   Hip Abduction  Stengthening;Both;2 sets;10 reps    Hip Extension  Stengthening;Both;2 sets;10 reps    Other Standing Knee Exercises  weight shifting medial/lateral and anterior/posterior x 2 minutes each    Other Standing Knee Exercises  sit to stand 2x10      Knee/Hip Exercises: Seated   Long Arc Quad  Strengthening;Left;10 reps    Long Arc Quad Weight  2 lbs.      Knee/Hip Exercises: Supine   Straight Leg Raises  Strengthening;Left;2 sets;10 reps    Other Supine Knee/Hip Exercises  sidelying hip abduction: 2x10      Vasopneumatic   Number Minutes Vasopneumatic   15 minutes    Vasopnuematic Location   Ankle    Vasopneumatic Pressure  Medium    Vasopneumatic Temperature   3 snowflakes      Manual Therapy   Manual Therapy  Edema management    Manual therapy comments  soft tissue and scar massage to Lt ankle      Ankle Exercises: Seated   BAPS  Sitting;Level 2 fwd/back and side to side; circles both ways    Other Seated Ankle Exercises  Prostretch x 3 minutes               PT Short Term Goals - 12/01/17 1110      PT SHORT TERM GOAL #2   Title  demonstrate symmetry with ambulation on level surface wearing CAM boot    Baseline  using cane, antalgia with reduced time spent in stance on Lt    Time  4    Period  Weeks    Status  On-going      PT SHORT TERM GOAL #3    Title  report < or = to 5/10 Lt ankle pain with standing and walking    Baseline  up to 7/10 with increased activity    Time  4    Period  Weeks    Status  On-going        PT Long Term Goals - 11/11/17 1244      PT LONG TERM GOAL #1   Title  be independent in advanced HEP    Time  8    Period  Weeks    Status  New    Target Date  01/06/18      PT LONG TERM GOAL #2   Title  reduce FOTO to < or = to 50% limitation    Time  8    Period  Weeks    Status  New    Target Date  01/06/18      PT LONG TERM GOAL #3   Title  report < or = to 3/10 Lt ankle pain with standing and walking    Time  8    Period  Weeks    Status  New    Target Date  01/06/18      PT LONG TERM GOAL #4   Title  demonstrate > or = to 4/5 Lt ankle strength to improve stability and safety with gait     Time  8    Period  Weeks    Status  New    Target Date  01/06/18      PT LONG TERM GOAL #5   Title  improve LE strength to walk for 30 minutes in the community without limitation    Time  8    Period  Weeks    Status  New    Target Date  01/06/18            Plan - 12/03/17 1040    Clinical Impression Statement  Pt tolerated standing activity wearing boot on the Lt to encourage hip strength and weightbearing function.  Pt requires verbal and tactile cues for hip alignment with exercise.  Pt requires tactile cues for Baps in sitting today.  Pt with moderate Lt ankle edema today and responded well to manual therapy today.  Pt will continue to benefit from skilled PT for strength, ROM, edema management and proprioception.  Rehab Potential  Good    PT Frequency  2x / week    PT Duration  8 weeks    PT Treatment/Interventions  ADLs/Self Care Home Management;Cryotherapy;Electrical Stimulation;Gait training;Stair training;Functional mobility training;Therapeutic activities;Therapeutic exercise;Balance training;Patient/family education;Neuromuscular re-education;Manual techniques;Scar mobilization;Passive  range of motion;Vasopneumatic Device;Taping    PT Next Visit Plan  Lt ankle ROM progression, strengtening as tolerated, edema management, seated baps, manual for tissue mobility.  weight shifting and sit to stand with boot    Consulted and Agree with Plan of Care  Patient       Patient will benefit from skilled therapeutic intervention in order to improve the following deficits and impairments:  Abnormal gait, Pain, Increased fascial restricitons, Impaired flexibility, Increased edema, Decreased balance, Decreased activity tolerance, Decreased endurance, Decreased range of motion, Decreased strength, Decreased scar mobility  Visit Diagnosis: Other abnormalities of gait and mobility  Muscle weakness (generalized)  Stiffness of left ankle, not elsewhere classified  Pain in left ankle and joints of left foot  Localized edema     Problem List Patient Active Problem List   Diagnosis Date Noted  . Trimalleolar fracture of ankle, closed, left, initial encounter 09/24/2017  . Tibia/fibula fracture 09/23/2017  . Closed left ankle fracture 09/23/2017  . Ankle dislocation, left, initial encounter 09/23/2017  . Thyroid disease   . Sleep apnea   . Osteoporosis   . Type 2 diabetes mellitus ([place])   . Oth intartic fracture of lower end of right radius, init 07/29/2016  . Anal and rectal polyp 12/16/2013    Sigurd Sos, PT 12/03/17 11:26 AM  [place] [facility] [address], [place], [place], [address] Phone: [phone]   Fax:  [phone]  Name: Destiny [name] MRN: [id number] Date of Birth: 05-04-1946

## 2017-12-08 ENCOUNTER — Ambulatory Visit: Payer: PPO

## 2017-12-08 DIAGNOSIS — R2689 Other abnormalities of gait and mobility: Secondary | ICD-10-CM

## 2017-12-08 DIAGNOSIS — M6281 Muscle weakness (generalized): Secondary | ICD-10-CM

## 2017-12-08 DIAGNOSIS — R600 Localized edema: Secondary | ICD-10-CM

## 2017-12-08 DIAGNOSIS — M25572 Pain in left ankle and joints of left foot: Secondary | ICD-10-CM

## 2017-12-08 DIAGNOSIS — M25672 Stiffness of left ankle, not elsewhere classified: Secondary | ICD-10-CM

## 2017-12-08 NOTE — Therapy (Signed)
[facility] [address], [place], [place], [address] Phone: [phone]   Fax:  [phone]  Physical Therapy Treatment  Patient Details  Name: Destiny [name] MRN: [id number] Date of Birth: 11-15-1945 Referring Provider: Katha Hamming, MD   Encounter Date: 12/08/2017  PT End of Session - 12/08/17 1150    Visit Number  8    Date for PT Re-Evaluation  01/06/18    Authorization Type  Medicare    Authorization Time Period  KX at 26    PT Start Time  1100    PT Stop Time  1206    PT Time Calculation (min)  66 min    Activity Tolerance  Patient tolerated treatment well    Behavior During Therapy  [facility] for tasks assessed/performed       Past Medical History:  Diagnosis Date  . Allergy   . Ankle dislocation, left, initial encounter 09/23/2017  . Arthritis   . Blood transfusion    in 1980  . Diabetes mellitus   . Osteoporosis    osteopenia  . Sleep apnea    does not use cpap  . Thyroid disease    hypothyroidism    Past Surgical History:  Procedure Laterality Date  . COLONOSCOPY    . EXTERNAL FIXATION LEG Left 09/24/2017   Procedure: EXTERNAL FIXATION ankle;  Surgeon: Shona Needles, MD;  Location: [place];  Service: Orthopedics;  Laterality: Left;  . EXTERNAL FIXATION REMOVAL Left 09/28/2017   Procedure: REMOVAL EXTERNAL FIXATION LEG;  Surgeon: Shona Needles, MD;  Location: [place];  Service: Orthopedics;  Laterality: Left;  . LAPAROSCOPIC GASTRIC BANDING    . OPEN REDUCTION INTERNAL FIXATION (ORIF) DISTAL RADIAL FRACTURE Right 07/29/2016   Procedure: RIGHT OPEN REDUCTION INTERNAL FIXATION (ORIF) DISTAL RADIAL FRACTURE;  Surgeon: Leanora Cover, MD;  Location: [place];  Service: Orthopedics;  Laterality: Right;  . ORIF ANKLE FRACTURE Left 09/28/2017   Procedure: OPEN REDUCTION INTERNAL FIXATION (ORIF) ANKLE FRACTURE;  Surgeon: Shona Needles, MD;  Location: [place];  Service: Orthopedics;   Laterality: Left;  . POLYPECTOMY    . TOTAL ABDOMINAL HYSTERECTOMY W/ BILATERAL SALPINGOOPHORECTOMY    . WISDOM TOOTH EXTRACTION      There were no vitals filed for this visit.  Subjective Assessment - 12/08/17 1102    Subjective  I am doing better.  I still have swelling but it is less than before.      Pain Score  1     Pain Location  Ankle    Pain Orientation  Left    Pain Descriptors / Indicators  Aching;Tightness    Pain Onset  More than a month ago    Pain Frequency  Constant    Aggravating Factors   standing too much    Pain Relieving Factors  rest, ice                      [facility] Adult PT Treatment/Exercise - 12/08/17 0001      Knee/Hip Exercises: Aerobic   Nustep  L1 x 8 min PT present for status update- no boot      Knee/Hip Exercises: Standing   Hip Abduction  Stengthening;Both;2 sets;10 reps    Hip Extension  Stengthening;Both;2 sets;10 reps    Other Standing Knee Exercises  weight shifting medial/lateral and anterior/posterior x 2 minutes each    Other Standing Knee Exercises  sit to stand 2x10  Knee/Hip Exercises: Seated   Long Arc Quad  Strengthening;Left;10 reps    Long Arc Quad Weight  2 lbs.      Vasopneumatic   Number Minutes Vasopneumatic   15 minutes    Vasopnuematic Location   Ankle    Vasopneumatic Pressure  Medium    Vasopneumatic Temperature   3 snowflakes      Manual Therapy   Manual Therapy  Edema management    Manual therapy comments  soft tissue and scar massage to Lt ankle      Ankle Exercises: Seated   BAPS  Sitting;Level 2 fwd/back and side to side; circles both ways    Other Seated Ankle Exercises  Prostretch x 3 minutes               PT Short Term Goals - 12/01/17 1110      PT SHORT TERM GOAL #2   Title  demonstrate symmetry with ambulation on level surface wearing CAM boot    Baseline  using cane, antalgia with reduced time spent in stance on Lt    Time  4    Period  Weeks    Status  On-going       PT SHORT TERM GOAL #3   Title  report < or = to 5/10 Lt ankle pain with standing and walking    Baseline  up to 7/10 with increased activity    Time  4    Period  Weeks    Status  On-going        PT Long Term Goals - 11/11/17 1244      PT LONG TERM GOAL #1   Title  be independent in advanced HEP    Time  8    Period  Weeks    Status  New    Target Date  01/06/18      PT LONG TERM GOAL #2   Title  reduce FOTO to < or = to 50% limitation    Time  8    Period  Weeks    Status  New    Target Date  01/06/18      PT LONG TERM GOAL #3   Title  report < or = to 3/10 Lt ankle pain with standing and walking    Time  8    Period  Weeks    Status  New    Target Date  01/06/18      PT LONG TERM GOAL #4   Title  demonstrate > or = to 4/5 Lt ankle strength to improve stability and safety with gait     Time  8    Period  Weeks    Status  New    Target Date  01/06/18      PT LONG TERM GOAL #5   Title  improve LE strength to walk for 30 minutes in the community without limitation    Time  8    Period  Weeks    Status  New    Target Date  01/06/18            Plan - 12/08/17 1105    Clinical Impression Statement  Pt has improved weightbearing in the Lt LE with standing activities today.  Pt is able to self-correct her Lt hip alignment today.  Pt requires tactile cues for Baps in sitting today.  Pt with moderate ankle edema and responded well to manual therapy.  Pt will continue to benefit from skilled  PT for Lt ankle A/ROM, flexibility, strength, gait and edema management.      Rehab Potential  Good    PT Frequency  2x / week    PT Duration  8 weeks    PT Treatment/Interventions  ADLs/Self Care Home Management;Cryotherapy;Electrical Stimulation;Gait training;Stair training;Functional mobility training;Therapeutic activities;Therapeutic exercise;Balance training;Patient/family education;Neuromuscular re-education;Manual techniques;Scar mobilization;Passive range of  motion;Vasopneumatic Device;Taping    PT Next Visit Plan  Lt ankle ROM progression, strengtening as tolerated, edema management, seated baps, manual for tissue mobility.  weight shifting and sit to stand with boot    Consulted and Agree with Plan of Care  Patient       Patient will benefit from skilled therapeutic intervention in order to improve the following deficits and impairments:  Abnormal gait, Pain, Increased fascial restricitons, Impaired flexibility, Increased edema, Decreased balance, Decreased activity tolerance, Decreased endurance, Decreased range of motion, Decreased strength, Decreased scar mobility  Visit Diagnosis: Other abnormalities of gait and mobility  Muscle weakness (generalized)  Stiffness of left ankle, not elsewhere classified  Pain in left ankle and joints of left foot  Localized edema     Problem List Patient Active Problem List   Diagnosis Date Noted  . Trimalleolar fracture of ankle, closed, left, initial encounter 09/24/2017  . Tibia/fibula fracture 09/23/2017  . Closed left ankle fracture 09/23/2017  . Ankle dislocation, left, initial encounter 09/23/2017  . Thyroid disease   . Sleep apnea   . Osteoporosis   . Type 2 diabetes mellitus ([place])   . Oth intartic fracture of lower end of right radius, init 07/29/2016  . Anal and rectal polyp 12/16/2013    Sigurd Sos, PT 12/08/17 11:52 AM  [facility] [address], [place], [place], [address] Phone: [phone]   Fax:  [phone]  Name: VENISHA BOEHNING MRN: [id number] Date of Birth: [birth year]

## 2017-12-10 ENCOUNTER — Ambulatory Visit: Payer: PPO

## 2017-12-10 DIAGNOSIS — R600 Localized edema: Secondary | ICD-10-CM

## 2017-12-10 DIAGNOSIS — R2689 Other abnormalities of gait and mobility: Secondary | ICD-10-CM

## 2017-12-10 DIAGNOSIS — M25572 Pain in left ankle and joints of left foot: Secondary | ICD-10-CM

## 2017-12-10 DIAGNOSIS — M6281 Muscle weakness (generalized): Secondary | ICD-10-CM

## 2017-12-10 DIAGNOSIS — M25672 Stiffness of left ankle, not elsewhere classified: Secondary | ICD-10-CM

## 2017-12-10 NOTE — Therapy (Signed)
[facility] [address] [place], [place], [address] Phone: [phone]   Fax:  [phone]  Physical Therapy Treatment  Patient Details  Name: Destiny [name] MRN: [id number] Date of Birth: 10-06-1946 Referring Provider: Katha Hamming, MD   Encounter Date: 12/10/2017  PT End of Session - 12/10/17 1146    Visit Number  9    Date for PT Re-Evaluation  01/06/18    Authorization Type  Medicare    Authorization Time Period  KX at 28    PT Start Time  1104    PT Stop Time  1203    PT Time Calculation (min)  59 min    Activity Tolerance  Patient tolerated treatment well    Behavior During Therapy  [facility] for tasks assessed/performed       Past Medical History:  Diagnosis Date  . Allergy   . Ankle dislocation, left, initial encounter 09/23/2017  . Arthritis   . Blood transfusion    in 1980  . Diabetes mellitus   . Osteoporosis    osteopenia  . Sleep apnea    does not use cpap  . Thyroid disease    hypothyroidism    Past Surgical History:  Procedure Laterality Date  . COLONOSCOPY    . EXTERNAL FIXATION LEG Left 09/24/2017   Procedure: EXTERNAL FIXATION ankle;  Surgeon: Shona Needles, MD;  Location: [place];  Service: Orthopedics;  Laterality: Left;  . EXTERNAL FIXATION REMOVAL Left 09/28/2017   Procedure: REMOVAL EXTERNAL FIXATION LEG;  Surgeon: Shona Needles, MD;  Location: [place];  Service: Orthopedics;  Laterality: Left;  . LAPAROSCOPIC GASTRIC BANDING    . OPEN REDUCTION INTERNAL FIXATION (ORIF) DISTAL RADIAL FRACTURE Right 07/29/2016   Procedure: RIGHT OPEN REDUCTION INTERNAL FIXATION (ORIF) DISTAL RADIAL FRACTURE;  Surgeon: Leanora Cover, MD;  Location: [place];  Service: Orthopedics;  Laterality: Right;  . ORIF ANKLE FRACTURE Left 09/28/2017   Procedure: OPEN REDUCTION INTERNAL FIXATION (ORIF) ANKLE FRACTURE;  Surgeon: Shona Needles, MD;  Location: [place];  Service: Orthopedics;   Laterality: Left;  . POLYPECTOMY    . TOTAL ABDOMINAL HYSTERECTOMY W/ BILATERAL SALPINGOOPHORECTOMY    . WISDOM TOOTH EXTRACTION      There were no vitals filed for this visit.  Subjective Assessment - 12/10/17 1103    Subjective  I'm getting better every day.    Currently in Pain?  No/denies                      [facility] Adult PT Treatment/Exercise - 12/10/17 0001      Knee/Hip Exercises: Aerobic   Nustep  L1 x 8 min PT present for status update- no boot      Knee/Hip Exercises: Standing   Hip Abduction  Stengthening;Both;2 sets;10 reps    Hip Extension  Stengthening;Both;2 sets;10 reps    Other Standing Knee Exercises  weight shifting medial/lateral and anterior/posterior x 2 minutes each    Other Standing Knee Exercises  sit to stand 2x10      Knee/Hip Exercises: Seated   Long Arc Quad  Strengthening;Left;10 reps    Long Arc Quad Weight  2 lbs.      Vasopneumatic   Number Minutes Vasopneumatic   15 minutes    Vasopnuematic Location   Ankle    Vasopneumatic Pressure  Medium    Vasopneumatic Temperature   3 snowflakes      Manual Therapy   Manual Therapy  Edema management    Manual therapy comments  soft tissue and scar massage to Lt ankle      Ankle Exercises: Seated   BAPS  Sitting;Level 2 fwd/back and side to side; circles both ways             PT Education - 12/10/17 1115    Education provided  Yes    Education Details  sit to stand, standing hip abduction and extension    Person(s) Educated  Patient    Methods  Explanation;Demonstration;Handout    Comprehension  Verbalized understanding;Returned demonstration       PT Short Term Goals - 12/01/17 1110      PT SHORT TERM GOAL #2   Title  demonstrate symmetry with ambulation on level surface wearing CAM boot    Baseline  using cane, antalgia with reduced time spent in stance on Lt    Time  4    Period  Weeks    Status  On-going      PT SHORT TERM GOAL #3   Title  report < or = to 5/10  Lt ankle pain with standing and walking    Baseline  up to 7/10 with increased activity    Time  4    Period  Weeks    Status  On-going        PT Long Term Goals - 11/11/17 1244      PT LONG TERM GOAL #1   Title  be independent in advanced HEP    Time  8    Period  Weeks    Status  New    Target Date  01/06/18      PT LONG TERM GOAL #2   Title  reduce FOTO to < or = to 50% limitation    Time  8    Period  Weeks    Status  New    Target Date  01/06/18      PT LONG TERM GOAL #3   Title  report < or = to 3/10 Lt ankle pain with standing and walking    Time  8    Period  Weeks    Status  New    Target Date  01/06/18      PT LONG TERM GOAL #4   Title  demonstrate > or = to 4/5 Lt ankle strength to improve stability and safety with gait     Time  8    Period  Weeks    Status  New    Target Date  01/06/18      PT LONG TERM GOAL #5   Title  improve LE strength to walk for 30 minutes in the community without limitation    Time  8    Period  Weeks    Status  New    Target Date  01/06/18            Plan - 12/10/17 1106    Clinical Impression Statement  Pt demonstrates improved weightbearing on the Lt LE with standing activity today.  Pt is making frequent corrections to Lt hip alignment with activity.  Pt requires mild tactile cues for Baps in sitting today. Mild edema remains in the Lt ankle.  Pt will continue to benefit from skilled PT for Lt ankle A/ROM, flexibility, strength, gait and edema management.      Rehab Potential  Good    PT Frequency  2x / week    PT Duration  8  weeks    PT Treatment/Interventions  ADLs/Self Care Home Management;Cryotherapy;Electrical Stimulation;Gait training;Stair training;Functional mobility training;Therapeutic activities;Therapeutic exercise;Balance training;Patient/family education;Neuromuscular re-education;Manual techniques;Scar mobilization;Passive range of motion;Vasopneumatic Device;Taping    PT Next Visit Plan  Lt ankle ROM  progression, strengtening as tolerated, edema management, seated baps, manual for tissue mobility.  weight shifting and sit to stand with boot    Consulted and Agree with Plan of Care  Patient       Patient will benefit from skilled therapeutic intervention in order to improve the following deficits and impairments:  Abnormal gait, Pain, Increased fascial restricitons, Impaired flexibility, Increased edema, Decreased balance, Decreased activity tolerance, Decreased endurance, Decreased range of motion, Decreased strength, Decreased scar mobility  Visit Diagnosis: Other abnormalities of gait and mobility  Muscle weakness (generalized)  Stiffness of left ankle, not elsewhere classified  Pain in left ankle and joints of left foot  Localized edema     Problem List Patient Active Problem List   Diagnosis Date Noted  . Trimalleolar fracture of ankle, closed, left, initial encounter 09/24/2017  . Tibia/fibula fracture 09/23/2017  . Closed left ankle fracture 09/23/2017  . Ankle dislocation, left, initial encounter 09/23/2017  . Thyroid disease   . Sleep apnea   . Osteoporosis   . Type 2 diabetes mellitus ([place])   . Oth intartic fracture of lower end of right radius, init 07/29/2016  . Anal and rectal polyp 12/16/2013     Sigurd Sos, PT 12/10/17 11:48 AM  [facility] [address] [place], [place], [address] Phone: [phone]   Fax:  [phone]  Name: NARGIS ABRAMS MRN: [id number] Date of Birth: 12/09/1945

## 2017-12-10 NOTE — Patient Instructions (Addendum)
  ABDUCTION: Standing (Active)   Stand, feet flat. Lift right leg out to side. Use _0__ lbs. Complete __10_ repetitions. Perform __2_ sessions per day.    EXTENSION: Standing (Active)  Stand, both feet flat. Draw right leg behind body as far as possible. Use 0___ lbs. Complete 10 repetitions. Perform __2_ sessions per day.  Copyright  VHI. All rights reserved.   HIP / KNEE: Extension - Sit to Stand    Sitting, lean chest forward, raise hips up from surface. Straighten hips and knees. Weight bear equally on left and right sides. Backs of legs should not push off surface. _10__ reps per set, _2 sets per day.  Copyright  VHI. All rights reserved.  [facility] [address] [place], [place] [address] Phone # [phone] Fax [phone]

## 2017-12-15 ENCOUNTER — Ambulatory Visit: Payer: PPO | Attending: Student

## 2017-12-15 DIAGNOSIS — R600 Localized edema: Secondary | ICD-10-CM | POA: Diagnosis not present

## 2017-12-15 DIAGNOSIS — M25572 Pain in left ankle and joints of left foot: Secondary | ICD-10-CM | POA: Insufficient documentation

## 2017-12-15 DIAGNOSIS — R2689 Other abnormalities of gait and mobility: Secondary | ICD-10-CM | POA: Diagnosis not present

## 2017-12-15 DIAGNOSIS — M25672 Stiffness of left ankle, not elsewhere classified: Secondary | ICD-10-CM

## 2017-12-15 DIAGNOSIS — M6281 Muscle weakness (generalized): Secondary | ICD-10-CM | POA: Diagnosis not present

## 2017-12-15 NOTE — Therapy (Signed)
[facility] [address] [place], [place], [address] Phone: [phone]   Fax:  [phone]  Physical Therapy Treatment  Patient Details  Name: Destiny [name] MRN: [id number] Date of Birth: 1946-09-13 Referring Provider: Katha Hamming, MD   Encounter Date: 12/15/2017  PT End of Session - 12/15/17 1320    Visit Number  10    Date for PT Re-Evaluation  01/06/18    Authorization Type  Medicare    Authorization Time Period  KX at 64    PT Start Time  1233    PT Stop Time  1330    PT Time Calculation (min)  57 min    Behavior During Therapy  [facility] for tasks assessed/performed       Past Medical History:  Diagnosis Date  . Allergy   . Ankle dislocation, left, initial encounter 09/23/2017  . Arthritis   . Blood transfusion    in 1980  . Diabetes mellitus   . Osteoporosis    osteopenia  . Sleep apnea    does not use cpap  . Thyroid disease    hypothyroidism    Past Surgical History:  Procedure Laterality Date  . COLONOSCOPY    . EXTERNAL FIXATION LEG Left 09/24/2017   Procedure: EXTERNAL FIXATION ankle;  Surgeon: Shona Needles, MD;  Location: [place];  Service: Orthopedics;  Laterality: Left;  . EXTERNAL FIXATION REMOVAL Left 09/28/2017   Procedure: REMOVAL EXTERNAL FIXATION LEG;  Surgeon: Shona Needles, MD;  Location: [place];  Service: Orthopedics;  Laterality: Left;  . LAPAROSCOPIC GASTRIC BANDING    . OPEN REDUCTION INTERNAL FIXATION (ORIF) DISTAL RADIAL FRACTURE Right 07/29/2016   Procedure: RIGHT OPEN REDUCTION INTERNAL FIXATION (ORIF) DISTAL RADIAL FRACTURE;  Surgeon: Leanora Cover, MD;  Location: [place];  Service: Orthopedics;  Laterality: Right;  . ORIF ANKLE FRACTURE Left 09/28/2017   Procedure: OPEN REDUCTION INTERNAL FIXATION (ORIF) ANKLE FRACTURE;  Surgeon: Shona Needles, MD;  Location: [place];  Service: Orthopedics;  Laterality: Left;  . POLYPECTOMY    . TOTAL ABDOMINAL  HYSTERECTOMY W/ BILATERAL SALPINGOOPHORECTOMY    . WISDOM TOOTH EXTRACTION      There were no vitals filed for this visit.  Subjective Assessment - 12/15/17 1244    Subjective  I am about ready to let go of my cane.      Pertinent History  Lt ankle ORIF: 09/28/17    Currently in Pain?  No/denies    Pain Score  -- yesterday 3/10                      [facility] Adult PT Treatment/Exercise - 12/15/17 0001      Knee/Hip Exercises: Aerobic   Nustep  L1 x 8 min PT present for status update- no boot      Knee/Hip Exercises: Standing   Hip Abduction  Stengthening;Both;2 sets;10 reps    Hip Extension  Stengthening;Both;2 sets;10 reps    Rebounder  weight shifting in boot 3 directions x 1 min each    Other Standing Knee Exercises  --    Other Standing Knee Exercises  sit to stand 2x10      Vasopneumatic   Number Minutes Vasopneumatic   15 minutes    Vasopnuematic Location   Ankle    Vasopneumatic Pressure  Medium    Vasopneumatic Temperature   3 snowflakes      Manual Therapy   Manual Therapy  Edema management  Manual therapy comments  soft tissue and scar massage to Lt ankle      Ankle Exercises: Seated   BAPS  Sitting;Level 2 fwd/back and side to side; circles both ways               PT Short Term Goals - 12/15/17 1247      PT SHORT TERM GOAL #2   Title  demonstrate symmetry with ambulation on level surface wearing CAM boot    Baseline  mild assymmetry without cane    Time  4    Period  Weeks    Status  On-going      PT SHORT TERM GOAL #3   Title  report < or = to 5/10 Lt ankle pain with standing and walking    Baseline  up to 5/10 with increased activity    Status  Achieved        PT Long Term Goals - 11/11/17 1244      PT LONG TERM GOAL #1   Title  be independent in advanced HEP    Time  8    Period  Weeks    Status  New    Target Date  01/06/18      PT LONG TERM GOAL #2   Title  reduce FOTO to < or = to 50% limitation    Time  8     Period  Weeks    Status  New    Target Date  01/06/18      PT LONG TERM GOAL #3   Title  report < or = to 3/10 Lt ankle pain with standing and walking    Time  8    Period  Weeks    Status  New    Target Date  01/06/18      PT LONG TERM GOAL #4   Title  demonstrate > or = to 4/5 Lt ankle strength to improve stability and safety with gait     Time  8    Period  Weeks    Status  New    Target Date  01/06/18      PT LONG TERM GOAL #5   Title  improve LE strength to walk for 30 minutes in the community without limitation    Time  8    Period  Weeks    Status  New    Target Date  01/06/18            Plan - 12/15/17 1248    Clinical Impression Statement  Pt with improved symmetry with gait on level surface with boot.  Pt is making corrections to Lt hip alignment with activity.  Pt requires fewer tactile cues with seated Baps today.  Pt continues to report intermittent increase in Lt ankle pain with standing longer periods.  Pt will see MD next week for follow-up.  Pt will continue to benefit from skilled PT for LE strength, gait, proprioception and edema management as pt weans from boot as allowed by MD.    Rehab Potential  Good    PT Frequency  2x / week    PT Duration  8 weeks    PT Treatment/Interventions  ADLs/Self Care Home Management;Cryotherapy;Electrical Stimulation;Gait training;Stair training;Functional mobility training;Therapeutic activities;Therapeutic exercise;Balance training;Patient/family education;Neuromuscular re-education;Manual techniques;Scar mobilization;Passive range of motion;Vasopneumatic Device;Taping    PT Next Visit Plan  Lt ankle ROM progression, strengtening as tolerated, edema management, seated baps, manual for tissue mobility.  weight shifting and sit to  stand with boot    Consulted and Agree with Plan of Care  Patient       Patient will benefit from skilled therapeutic intervention in order to improve the following deficits and impairments:   Abnormal gait, Pain, Increased fascial restricitons, Impaired flexibility, Increased edema, Decreased balance, Decreased activity tolerance, Decreased endurance, Decreased range of motion, Decreased strength, Decreased scar mobility  Visit Diagnosis: Other abnormalities of gait and mobility  Muscle weakness (generalized)  Stiffness of left ankle, not elsewhere classified  Pain in left ankle and joints of left foot  Localized edema     Problem List Patient Active Problem List   Diagnosis Date Noted  . Trimalleolar fracture of ankle, closed, left, initial encounter 09/24/2017  . Tibia/fibula fracture 09/23/2017  . Closed left ankle fracture 09/23/2017  . Ankle dislocation, left, initial encounter 09/23/2017  . Thyroid disease   . Sleep apnea   . Osteoporosis   . Type 2 diabetes mellitus ([place])   . Oth intartic fracture of lower end of right radius, init 07/29/2016  . Anal and rectal polyp 12/16/2013     Sigurd Sos, PT 12/15/17 1:25 PM  [facility] [address], [place], [place], [address] Phone: [phone]   Fax:  [phone]  Name: Destiny [name] MRN: [id number] Date of Birth: May 20, 1946

## 2017-12-17 ENCOUNTER — Ambulatory Visit: Payer: PPO

## 2017-12-17 DIAGNOSIS — M6281 Muscle weakness (generalized): Secondary | ICD-10-CM

## 2017-12-17 DIAGNOSIS — R600 Localized edema: Secondary | ICD-10-CM

## 2017-12-17 DIAGNOSIS — M25672 Stiffness of left ankle, not elsewhere classified: Secondary | ICD-10-CM

## 2017-12-17 DIAGNOSIS — R2689 Other abnormalities of gait and mobility: Secondary | ICD-10-CM | POA: Diagnosis not present

## 2017-12-17 DIAGNOSIS — M25572 Pain in left ankle and joints of left foot: Secondary | ICD-10-CM

## 2017-12-17 NOTE — Therapy (Signed)
[facility] [address] [place], [place], [address] Phone: [phone]   Fax:  [phone]  Physical Therapy Treatment  Patient Details  Name: Destiny [name] MRN: [id number] Date of Birth: 1946/08/22 Referring Provider: Katha Hamming, MD   Encounter Date: 12/17/2017  PT End of Session - 12/17/17 1301    Visit Number  11    Date for PT Re-Evaluation  01/06/18    Authorization Type  Medicare    Authorization Time Period  KX at 58    PT Start Time  1229 pt needed to leave early    PT Stop Time  1317    PT Time Calculation (min)  48 min    Activity Tolerance  Patient tolerated treatment well    Behavior During Therapy  [facility] for tasks assessed/performed       Past Medical History:  Diagnosis Date  . Allergy   . Ankle dislocation, left, initial encounter 09/23/2017  . Arthritis   . Blood transfusion    in 1980  . Diabetes mellitus   . Osteoporosis    osteopenia  . Sleep apnea    does not use cpap  . Thyroid disease    hypothyroidism    Past Surgical History:  Procedure Laterality Date  . COLONOSCOPY    . EXTERNAL FIXATION LEG Left 09/24/2017   Procedure: EXTERNAL FIXATION ankle;  Surgeon: Shona Needles, MD;  Location: [place];  Service: Orthopedics;  Laterality: Left;  . EXTERNAL FIXATION REMOVAL Left 09/28/2017   Procedure: REMOVAL EXTERNAL FIXATION LEG;  Surgeon: Shona Needles, MD;  Location: [place];  Service: Orthopedics;  Laterality: Left;  . LAPAROSCOPIC GASTRIC BANDING    . OPEN REDUCTION INTERNAL FIXATION (ORIF) DISTAL RADIAL FRACTURE Right 07/29/2016   Procedure: RIGHT OPEN REDUCTION INTERNAL FIXATION (ORIF) DISTAL RADIAL FRACTURE;  Surgeon: Leanora Cover, MD;  Location: [place];  Service: Orthopedics;  Laterality: Right;  . ORIF ANKLE FRACTURE Left 09/28/2017   Procedure: OPEN REDUCTION INTERNAL FIXATION (ORIF) ANKLE FRACTURE;  Surgeon: Shona Needles, MD;  Location: [place];   Service: Orthopedics;  Laterality: Left;  . POLYPECTOMY    . TOTAL ABDOMINAL HYSTERECTOMY W/ BILATERAL SALPINGOOPHORECTOMY    . WISDOM TOOTH EXTRACTION      There were no vitals filed for this visit.  Subjective Assessment - 12/17/17 1237    Subjective  My sister is in the hospital and I will need to leave a little early.  50-75% overall improvement since the start of care.    Pertinent History  Lt ankle ORIF: 09/28/17    Patient Stated Goals  walk without boot, improve Lt ankle strength and A/ROM    Currently in Pain?  No/denies         [facility] PT Assessment - 12/17/17 0001      Observation/Other Assessments   Focus on Therapeutic Outcomes (FOTO)   54% limitation                  [facility] Adult PT Treatment/Exercise - 12/17/17 0001      Knee/Hip Exercises: Aerobic   Nustep  L1 x 8 min PT present for status update- no boot      Knee/Hip Exercises: Standing   Rebounder  weight shifting in boot 3 directions x 1 min each      Vasopneumatic   Number Minutes Vasopneumatic   15 minutes    Vasopnuematic Location   Ankle    Vasopneumatic Pressure  Medium  Vasopneumatic Temperature   3 snowflakes               PT Short Term Goals - 12/15/17 1247      PT SHORT TERM GOAL #2   Title  demonstrate symmetry with ambulation on level surface wearing CAM boot    Baseline  mild assymmetry without cane    Time  4    Period  Weeks    Status  On-going      PT SHORT TERM GOAL #3   Title  report < or = to 5/10 Lt ankle pain with standing and walking    Baseline  up to 5/10 with increased activity    Status  Achieved        PT Long Term Goals - 12/17/17 1256      PT LONG TERM GOAL #2   Title  reduce FOTO to < or = to 50% limitation    Baseline  54% limitation    Time  8    Period  Weeks    Status  On-going      PT LONG TERM GOAL #3   Title  report < or = to 3/10 Lt ankle pain with standing and walking    Status  Achieved            Plan - 12/17/17  1245    Clinical Impression Statement  Pt with improved symmetry on level surface while wearing boot.  Pt will see MD next week for check-up.  Pt required verbal cues to control motion with Baps.  Pt with limited Lt ankle proprioception and ednurance and will continue to benefot from skilled PT for LT ankle strength, gait, endurance and edema management.      Rehab Potential  Good    PT Frequency  2x / week    PT Duration  8 weeks    PT Treatment/Interventions  ADLs/Self Care Home Management;Cryotherapy;Electrical Stimulation;Gait training;Stair training;Functional mobility training;Therapeutic activities;Therapeutic exercise;Balance training;Patient/family education;Neuromuscular re-education;Manual techniques;Scar mobilization;Passive range of motion;Vasopneumatic Device;Taping    PT Next Visit Plan  Lt ankle ROM progression, strengtening as tolerated, edema management, seated baps, manual for tissue mobility.  weight shifting and sit to stand with boot.  MD note next    Consulted and Agree with Plan of Care  Patient       Patient will benefit from skilled therapeutic intervention in order to improve the following deficits and impairments:  Abnormal gait, Pain, Increased fascial restricitons, Impaired flexibility, Increased edema, Decreased balance, Decreased activity tolerance, Decreased endurance, Decreased range of motion, Decreased strength, Decreased scar mobility  Visit Diagnosis: Other abnormalities of gait and mobility  Muscle weakness (generalized)  Stiffness of left ankle, not elsewhere classified  Pain in left ankle and joints of left foot  Localized edema     Problem List Patient Active Problem List   Diagnosis Date Noted  . Trimalleolar fracture of ankle, closed, left, initial encounter 09/24/2017  . Tibia/fibula fracture 09/23/2017  . Closed left ankle fracture 09/23/2017  . Ankle dislocation, left, initial encounter 09/23/2017  . Thyroid disease   . Sleep apnea    . Osteoporosis   . Type 2 diabetes mellitus ([place])   . Oth intartic fracture of lower end of right radius, init 07/29/2016  . Anal and rectal polyp 12/16/2013    TAKACS,KELLY 12/17/2017, 1:03 PM  [facility] [address] [place], [place], [address] Phone: [phone]   Fax:  [phone]  Name: Ardelia Mems  ADDYSIN PORCO MRN: [id number] Date of Birth: Jun 01, 1946

## 2017-12-22 ENCOUNTER — Ambulatory Visit: Payer: PPO

## 2017-12-22 DIAGNOSIS — R600 Localized edema: Secondary | ICD-10-CM

## 2017-12-22 DIAGNOSIS — R2689 Other abnormalities of gait and mobility: Secondary | ICD-10-CM | POA: Diagnosis not present

## 2017-12-22 DIAGNOSIS — E1140 Type 2 diabetes mellitus with diabetic neuropathy, unspecified: Secondary | ICD-10-CM | POA: Diagnosis not present

## 2017-12-22 DIAGNOSIS — M25672 Stiffness of left ankle, not elsewhere classified: Secondary | ICD-10-CM

## 2017-12-22 DIAGNOSIS — E785 Hyperlipidemia, unspecified: Secondary | ICD-10-CM | POA: Diagnosis not present

## 2017-12-22 DIAGNOSIS — M25572 Pain in left ankle and joints of left foot: Secondary | ICD-10-CM

## 2017-12-22 DIAGNOSIS — I10 Essential (primary) hypertension: Secondary | ICD-10-CM | POA: Diagnosis not present

## 2017-12-22 DIAGNOSIS — S82852D Displaced trimalleolar fracture of left lower leg, subsequent encounter for closed fracture with routine healing: Secondary | ICD-10-CM | POA: Diagnosis not present

## 2017-12-22 DIAGNOSIS — M6281 Muscle weakness (generalized): Secondary | ICD-10-CM

## 2017-12-22 NOTE — Therapy (Signed)
[facility] [address] [place], [place], [address] Phone: [phone]   Fax:  [phone]  Physical Therapy Treatment  Patient Details  Name: Destiny [name] MRN: [id number] Date of Birth: 20-Oct-1945 Referring Provider: Katha Hamming, MD   Encounter Date: 12/22/2017  PT End of Session - 12/22/17 1215    Visit Number  12    Date for PT Re-Evaluation  02/16/18    Authorization Type  Medicare    Authorization Time Period  KX at 76    PT Start Time  1130    PT Stop Time  1224    PT Time Calculation (min)  54 min    Activity Tolerance  Patient tolerated treatment well    Behavior During Therapy  [facility] for tasks assessed/performed       Past Medical History:  Diagnosis Date  . Allergy   . Ankle dislocation, left, initial encounter 09/23/2017  . Arthritis   . Blood transfusion    in 1980  . Diabetes mellitus   . Osteoporosis    osteopenia  . Sleep apnea    does not use cpap  . Thyroid disease    hypothyroidism    Past Surgical History:  Procedure Laterality Date  . COLONOSCOPY    . EXTERNAL FIXATION LEG Left 09/24/2017   Procedure: EXTERNAL FIXATION ankle;  Surgeon: Shona Needles, MD;  Location: [place];  Service: Orthopedics;  Laterality: Left;  . EXTERNAL FIXATION REMOVAL Left 09/28/2017   Procedure: REMOVAL EXTERNAL FIXATION LEG;  Surgeon: Shona Needles, MD;  Location: [place];  Service: Orthopedics;  Laterality: Left;  . LAPAROSCOPIC GASTRIC BANDING    . OPEN REDUCTION INTERNAL FIXATION (ORIF) DISTAL RADIAL FRACTURE Right 07/29/2016   Procedure: RIGHT OPEN REDUCTION INTERNAL FIXATION (ORIF) DISTAL RADIAL FRACTURE;  Surgeon: Leanora Cover, MD;  Location: [place];  Service: Orthopedics;  Laterality: Right;  . ORIF ANKLE FRACTURE Left 09/28/2017   Procedure: OPEN REDUCTION INTERNAL FIXATION (ORIF) ANKLE FRACTURE;  Surgeon: Shona Needles, MD;  Location: [place];  Service: Orthopedics;   Laterality: Left;  . POLYPECTOMY    . TOTAL ABDOMINAL HYSTERECTOMY W/ BILATERAL SALPINGOOPHORECTOMY    . WISDOM TOOTH EXTRACTION      There were no vitals filed for this visit.  Subjective Assessment - 12/22/17 1143    Subjective  Going to the MD today.      Patient Stated Goals  walk without boot, improve Lt ankle strength and A/ROM    Currently in Pain?  No/denies         [facility] PT Assessment - 12/22/17 0001      Assessment   Medical Diagnosis  Lt ankle fracture (s/p ORIF)      Home Environment   Living Environment  Private residence      Prior Function   Level of Independence  Independent      Observation/Other Assessments   Focus on Therapeutic Outcomes (FOTO)   54% limitation      Circumferential Edema   Circumferential - Left   11 inches mid malleolus      ROM / Strength   AROM / PROM / Strength  AROM;PROM;Strength      AROM   Overall AROM   Deficits    Right/Left Ankle  Left    Left Ankle Dorsiflexion  0    Left Ankle Plantar Flexion  40    Left Ankle Inversion  20    Left Ankle Eversion  17      Strength   Overall Strength  Deficits    Strength Assessment Site  Ankle    Right/Left Ankle  Left    Left Ankle Dorsiflexion  4/5    Left Ankle Inversion  4-/5    Left Ankle Eversion  4/5      Palpation   Palpation comment  surgical incision are healed      Ambulation/Gait   Ambulation/Gait  Yes    Gait Comments  wearing CAM boot.  Good symmetry with boot.  Cane used for longer distances.                  [facility] Adult PT Treatment/Exercise - 12/22/17 0001      Knee/Hip Exercises: Aerobic   Nustep  L1 x 8 min PT present for status update- no boot      Knee/Hip Exercises: Standing   Rebounder  weight shifting in boot 3 directions x 1 min each      Knee/Hip Exercises: Seated   Long Arc Quad  Strengthening;Left;10 reps;2 sets;Weights    Long Arc Quad Weight  3 lbs.      Vasopneumatic   Number Minutes Vasopneumatic   15 minutes     Vasopnuematic Location   Ankle    Vasopneumatic Pressure  Medium    Vasopneumatic Temperature   3 snowflakes      Ankle Exercises: Seated   Marble Pickup  10 marbles- difficult for patient    BAPS  Sitting;Level 2 fwd/back and side to side; circles both ways               PT Short Term Goals - 12/22/17 1159      PT SHORT TERM GOAL #2   Title  demonstrate symmetry with ambulation on level surface wearing CAM boot    Status  Achieved      PT SHORT TERM GOAL #3   Title  report < or = to 5/10 Lt ankle pain with standing and walking    Status  Achieved      PT SHORT TERM GOAL #4   Title  demonstrate active DF on the Lt to 5 degrees to allow for foot clearance when out of boot    Baseline  0    Time  4    Period  Weeks    Status  On-going    Target Date  01/12/18        PT Long Term Goals - 12/22/17 1159      PT LONG TERM GOAL #1   Title  be independent in advanced HEP    Time  8    Period  Weeks    Status  On-going    Target Date  02/16/18      PT LONG TERM GOAL #2   Title  reduce FOTO to < or = to 50% limitation    Baseline  54% limitation    Time  8    Period  Weeks    Status  On-going    Target Date  02/16/18      PT LONG TERM GOAL #4   Title  demonstrate > or = to 4+/5 Lt ankle strength to improve stability and safety with gait     Baseline  4/5 DF, inversion 4-/5, eversion 4/5    Time  8    Period  Weeks    Status  Revised    Target Date  02/16/18      PT  LONG TERM GOAL #5   Title  improve LE strength to walk for 45 minutes without boot  in the community without limitation    Baseline  in the grocery store x 30 minutes max in boot    Time  8    Status  Revised    Target Date  02/16/18      Additional Long Term Goals   Additional Long Term Goals  Yes      PT LONG TERM GOAL #6   Title  improve Lt ankle strength and proprioception to perform single leg stance x 10 seconds on the Lt    Time  8    Period  Weeks    Status  New    Target Date   02/16/18            Plan - 12/22/17 1204    Clinical Impression Statement  Pt is making steady progress with Lt ankle strength and ROM s/p Lt ankle ORIF.  Pt continues to wear boot and will see MD today for follow-up appointment.  Pt with improved Lt ankle strength and A/ROM.  Edema is also improved since the start of care.  Pt with weak Lt foot intrinsics and has difficulty with marble pick- up today.  Pt will continue to benefit from skilled PT for safe progression of gait and strength/AROM after pt weans from her boot.      Rehab Potential  Good    PT Frequency  2x / week    PT Duration  8 weeks    PT Treatment/Interventions  ADLs/Self Care Home Management;Cryotherapy;Electrical Stimulation;Gait training;Stair training;Functional mobility training;Therapeutic activities;Therapeutic exercise;Balance training;Patient/family education;Neuromuscular re-education;Manual techniques;Scar mobilization;Passive range of motion;Vasopneumatic Device;Taping    PT Next Visit Plan  See what MD says.      Recommended Other Services  initial certification and recert sent 4/70/96    Consulted and Agree with Plan of Care  Patient       Patient will benefit from skilled therapeutic intervention in order to improve the following deficits and impairments:  Abnormal gait, Pain, Increased fascial restricitons, Impaired flexibility, Increased edema, Decreased balance, Decreased activity tolerance, Decreased endurance, Decreased range of motion, Decreased strength, Decreased scar mobility  Visit Diagnosis: Other abnormalities of gait and mobility - Plan: PT plan of care cert/re-cert  Muscle weakness (generalized) - Plan: PT plan of care cert/re-cert  Stiffness of left ankle, not elsewhere classified - Plan: PT plan of care cert/re-cert  Pain in left ankle and joints of left foot - Plan: PT plan of care cert/re-cert  Localized edema - Plan: PT plan of care cert/re-cert     Problem List Patient Active  Problem List   Diagnosis Date Noted  . Trimalleolar fracture of ankle, closed, left, initial encounter 09/24/2017  . Tibia/fibula fracture 09/23/2017  . Closed left ankle fracture 09/23/2017  . Ankle dislocation, left, initial encounter 09/23/2017  . Thyroid disease   . Sleep apnea   . Osteoporosis   . Type 2 diabetes mellitus ([place])   . Oth intartic fracture of lower end of right radius, init 07/29/2016  . Anal and rectal polyp 12/16/2013     Sigurd Sos, PT 12/22/17 12:19 PM  [facility] [address] [place], [place], [address] Phone: [phone]   Fax:  [phone]  Name: Destiny [name] MRN: [id number] Date of Birth: 1946-05-20

## 2017-12-24 ENCOUNTER — Ambulatory Visit: Payer: PPO

## 2017-12-24 DIAGNOSIS — R2689 Other abnormalities of gait and mobility: Secondary | ICD-10-CM | POA: Diagnosis not present

## 2017-12-24 DIAGNOSIS — M6281 Muscle weakness (generalized): Secondary | ICD-10-CM

## 2017-12-24 DIAGNOSIS — M25672 Stiffness of left ankle, not elsewhere classified: Secondary | ICD-10-CM

## 2017-12-24 DIAGNOSIS — R600 Localized edema: Secondary | ICD-10-CM

## 2017-12-24 DIAGNOSIS — M25572 Pain in left ankle and joints of left foot: Secondary | ICD-10-CM

## 2017-12-24 NOTE — Patient Instructions (Addendum)
Single Leg - Eyes Open    Holding support, lift right leg while maintaining balance over other leg. Progress to removing hands from support surface for longer periods of time. Hold_10___ seconds. Repeat __5__ times per session. Do __2-3__ sessions per day.  Copyright  VHI. All rights reserved.  Heel Raises    Stand with support. With knees straight, raise heels off ground. Repeat _10__ times. Do _3__ times a day.  Copyright  VHI. All rights reserved.  [facility] [address], [place],  [address] Phone # [phone] Fax [phone]

## 2017-12-24 NOTE — Therapy (Signed)
[facility] [address] [place], [place], [address] Phone: [phone]   Fax:  [phone]  Physical Therapy Treatment  Patient Details  Name: Destiny [name] MRN: [id number] Date of Birth: [birth year] Referring Provider: Katha Hamming, MD   Encounter Date: 12/24/2017  PT End of Session - 12/24/17 1310    Visit Number  13    Date for PT Re-Evaluation  02/16/18    Authorization Type  Medicare    Authorization Time Period  KX at 74    PT Start Time  1235    PT Stop Time  1325    PT Time Calculation (min)  50 min    Activity Tolerance  Patient tolerated treatment well    Behavior During Therapy  [facility] for tasks assessed/performed       Past Medical History:  Diagnosis Date  . Allergy   . Ankle dislocation, left, initial encounter 09/23/2017  . Arthritis   . Blood transfusion    in 1980  . Diabetes mellitus   . Osteoporosis    osteopenia  . Sleep apnea    does not use cpap  . Thyroid disease    hypothyroidism    Past Surgical History:  Procedure Laterality Date  . COLONOSCOPY    . EXTERNAL FIXATION LEG Left 09/24/2017   Procedure: EXTERNAL FIXATION ankle;  Surgeon: Shona Needles, MD;  Location: [place];  Service: Orthopedics;  Laterality: Left;  . EXTERNAL FIXATION REMOVAL Left 09/28/2017   Procedure: REMOVAL EXTERNAL FIXATION LEG;  Surgeon: Shona Needles, MD;  Location: [place];  Service: Orthopedics;  Laterality: Left;  . LAPAROSCOPIC GASTRIC BANDING    . OPEN REDUCTION INTERNAL FIXATION (ORIF) DISTAL RADIAL FRACTURE Right 07/29/2016   Procedure: RIGHT OPEN REDUCTION INTERNAL FIXATION (ORIF) DISTAL RADIAL FRACTURE;  Surgeon: Leanora Cover, MD;  Location: [place];  Service: Orthopedics;  Laterality: Right;  . ORIF ANKLE FRACTURE Left 09/28/2017   Procedure: OPEN REDUCTION INTERNAL FIXATION (ORIF) ANKLE FRACTURE;  Surgeon: Shona Needles, MD;  Location: [place];  Service: Orthopedics;   Laterality: Left;  . POLYPECTOMY    . TOTAL ABDOMINAL HYSTERECTOMY W/ BILATERAL SALPINGOOPHORECTOMY    . WISDOM TOOTH EXTRACTION      There were no vitals filed for this visit.  Subjective Assessment - 12/24/17 1240    Subjective  Pt is now out of boot, wearing regular shoes and does not have any restrictions per MD.    Pertinent History  Lt ankle ORIF: 09/28/17    Currently in Pain?  No/denies                      [facility] Adult PT Treatment/Exercise - 12/24/17 0001      Knee/Hip Exercises: Aerobic   Nustep  L1 x 8 min PT present for status update- no boot      Knee/Hip Exercises: Standing   Heel Raises  Both;2 sets;10 reps    Rocker Board  3 minutes    SLS  5x 10 seconds    Rebounder  weight shifting without boot 3 directions x 1 min each      Vasopneumatic   Number Minutes Vasopneumatic   15 minutes    Vasopnuematic Location   Ankle    Vasopneumatic Pressure  Medium    Vasopneumatic Temperature   3 snowflakes      Ankle Exercises: Seated   BAPS  Sitting;Level 2 fwd/back and side to side; circles both ways  PT Education - 12/24/17 1301    Education provided  Yes    Education Details  standing: SLS and heel raises    Person(s) Educated  Patient    Methods  Explanation;Demonstration;Handout    Comprehension  Verbalized understanding;Returned demonstration       PT Short Term Goals - 12/22/17 1159      PT SHORT TERM GOAL #2   Title  demonstrate symmetry with ambulation on level surface wearing CAM boot    Status  Achieved      PT SHORT TERM GOAL #3   Title  report < or = to 5/10 Lt ankle pain with standing and walking    Status  Achieved      PT SHORT TERM GOAL #4   Title  demonstrate active DF on the Lt to 5 degrees to allow for foot clearance when out of boot    Baseline  0    Time  4    Period  Weeks    Status  On-going    Target Date  01/12/18        PT Long Term Goals - 12/22/17 1159      PT LONG TERM GOAL #1    Title  be independent in advanced HEP    Time  8    Period  Weeks    Status  On-going    Target Date  02/16/18      PT LONG TERM GOAL #2   Title  reduce FOTO to < or = to 50% limitation    Baseline  54% limitation    Time  8    Period  Weeks    Status  On-going    Target Date  02/16/18      PT LONG TERM GOAL #4   Title  demonstrate > or = to 4+/5 Lt ankle strength to improve stability and safety with gait     Baseline  4/5 DF, inversion 4-/5, eversion 4/5    Time  8    Period  Weeks    Status  Revised    Target Date  02/16/18      PT LONG TERM GOAL #5   Title  improve LE strength to walk for 45 minutes without boot  in the community without limitation    Baseline  in the grocery store x 30 minutes max in boot    Time  8    Status  Revised    Target Date  02/16/18      Additional Long Term Goals   Additional Long Term Goals  Yes      PT LONG TERM GOAL #6   Title  improve Lt ankle strength and proprioception to perform single leg stance x 10 seconds on the Lt    Time  8    Period  Weeks    Status  New    Target Date  02/16/18            Plan - 12/24/17 1251    Clinical Impression Statement  Pt is now out of boot.  Session today focused on proprioception and balance exercises in standing.  Pt was able to perform all exercises without increased pain.  PT discussed footwear with pt as she is wearing [employer]ko shoes with a rocker bottom and narrow heel.  Pt with limited Lt ankle proprioception and foot intrinsic strength and will benefit from skilled PT for progression of strength, proprioception and edema management now that she is out  of the boot.      Rehab Potential  Good    PT Frequency  2x / week    PT Duration  8 weeks    PT Treatment/Interventions  ADLs/Self Care Home Management;Cryotherapy;Electrical Stimulation;Gait training;Stair training;Functional mobility training;Therapeutic activities;Therapeutic exercise;Balance training;Patient/family  education;Neuromuscular re-education;Manual techniques;Scar mobilization;Passive range of motion;Vasopneumatic Device;Taping    PT Next Visit Plan  Lt ankle proprioception, strength and edema management    Recommended Other Services  initial certification and recertification are signed    Consulted and Agree with Plan of Care  Patient       Patient will benefit from skilled therapeutic intervention in order to improve the following deficits and impairments:  Abnormal gait, Pain, Increased fascial restricitons, Impaired flexibility, Increased edema, Decreased balance, Decreased activity tolerance, Decreased endurance, Decreased range of motion, Decreased strength, Decreased scar mobility  Visit Diagnosis: Other abnormalities of gait and mobility  Muscle weakness (generalized)  Stiffness of left ankle, not elsewhere classified  Pain in left ankle and joints of left foot  Localized edema     Problem List Patient Active Problem List   Diagnosis Date Noted  . Trimalleolar fracture of ankle, closed, left, initial encounter 09/24/2017  . Tibia/fibula fracture 09/23/2017  . Closed left ankle fracture 09/23/2017  . Ankle dislocation, left, initial encounter 09/23/2017  . Thyroid disease   . Sleep apnea   . Osteoporosis   . Type 2 diabetes mellitus ([place])   . Oth intartic fracture of lower end of right radius, init 07/29/2016  . Anal and rectal polyp 12/16/2013     Sigurd Sos, PT 12/24/17 1:16 PM  [facility] [address] [place], [place], [address] Phone: [phone]   Fax:  [phone]  Name: Destiny [name] MRN: [id number] Date of Birth: November 03, 1945

## 2017-12-25 DIAGNOSIS — M810 Age-related osteoporosis without current pathological fracture: Secondary | ICD-10-CM | POA: Diagnosis not present

## 2017-12-25 DIAGNOSIS — E1121 Type 2 diabetes mellitus with diabetic nephropathy: Secondary | ICD-10-CM | POA: Diagnosis not present

## 2017-12-25 DIAGNOSIS — B351 Tinea unguium: Secondary | ICD-10-CM | POA: Diagnosis not present

## 2017-12-25 DIAGNOSIS — E785 Hyperlipidemia, unspecified: Secondary | ICD-10-CM | POA: Diagnosis not present

## 2017-12-25 DIAGNOSIS — E039 Hypothyroidism, unspecified: Secondary | ICD-10-CM | POA: Diagnosis not present

## 2017-12-25 DIAGNOSIS — E1140 Type 2 diabetes mellitus with diabetic neuropathy, unspecified: Secondary | ICD-10-CM | POA: Diagnosis not present

## 2017-12-25 DIAGNOSIS — E042 Nontoxic multinodular goiter: Secondary | ICD-10-CM | POA: Diagnosis not present

## 2017-12-25 DIAGNOSIS — E237 Disorder of pituitary gland, unspecified: Secondary | ICD-10-CM | POA: Diagnosis not present

## 2017-12-25 DIAGNOSIS — E559 Vitamin D deficiency, unspecified: Secondary | ICD-10-CM | POA: Diagnosis not present

## 2017-12-25 DIAGNOSIS — I10 Essential (primary) hypertension: Secondary | ICD-10-CM | POA: Diagnosis not present

## 2017-12-25 DIAGNOSIS — Z6841 Body Mass Index (BMI) 40.0 and over, adult: Secondary | ICD-10-CM | POA: Diagnosis not present

## 2017-12-25 DIAGNOSIS — E11610 Type 2 diabetes mellitus with diabetic neuropathic arthropathy: Secondary | ICD-10-CM | POA: Diagnosis not present

## 2017-12-29 ENCOUNTER — Ambulatory Visit: Payer: PPO

## 2017-12-29 DIAGNOSIS — R2689 Other abnormalities of gait and mobility: Secondary | ICD-10-CM

## 2017-12-29 DIAGNOSIS — M25672 Stiffness of left ankle, not elsewhere classified: Secondary | ICD-10-CM

## 2017-12-29 DIAGNOSIS — M25572 Pain in left ankle and joints of left foot: Secondary | ICD-10-CM

## 2017-12-29 DIAGNOSIS — M6281 Muscle weakness (generalized): Secondary | ICD-10-CM

## 2017-12-29 DIAGNOSIS — R600 Localized edema: Secondary | ICD-10-CM

## 2017-12-29 NOTE — Therapy (Addendum)
[facility] [address] [place], [place], [address] Phone: [phone]   Fax:  [phone]  Physical Therapy Treatment  Patient Details  Name: Destiny [name] MRN: [id number] Date of Birth: 1945/11/17 Referring Provider: Katha Hamming, MD   Encounter Date: 12/29/2017  PT End of Session - 12/29/17 0917    Visit Number  14    Date for PT Re-Evaluation  02/16/18    Authorization Type  Medicare    Authorization Time Period  KX at 65    PT Start Time  0848    PT Stop Time  0926    PT Time Calculation (min)  38 min    Activity Tolerance  Patient tolerated treatment well    Behavior During Therapy  [facility] for tasks assessed/performed       Past Medical History:  Diagnosis Date  . Allergy   . Ankle dislocation, left, initial encounter 09/23/2017  . Arthritis   . Blood transfusion    in 1980  . Diabetes mellitus   . Osteoporosis    osteopenia  . Sleep apnea    does not use cpap  . Thyroid disease    hypothyroidism    Past Surgical History:  Procedure Laterality Date  . COLONOSCOPY    . EXTERNAL FIXATION LEG Left 09/24/2017   Procedure: EXTERNAL FIXATION ankle;  Surgeon: Shona Needles, MD;  Location: [place];  Service: Orthopedics;  Laterality: Left;  . EXTERNAL FIXATION REMOVAL Left 09/28/2017   Procedure: REMOVAL EXTERNAL FIXATION LEG;  Surgeon: Shona Needles, MD;  Location: [place];  Service: Orthopedics;  Laterality: Left;  . LAPAROSCOPIC GASTRIC BANDING    . OPEN REDUCTION INTERNAL FIXATION (ORIF) DISTAL RADIAL FRACTURE Right 07/29/2016   Procedure: RIGHT OPEN REDUCTION INTERNAL FIXATION (ORIF) DISTAL RADIAL FRACTURE;  Surgeon: Leanora Cover, MD;  Location: [place];  Service: Orthopedics;  Laterality: Right;  . ORIF ANKLE FRACTURE Left 09/28/2017   Procedure: OPEN REDUCTION INTERNAL FIXATION (ORIF) ANKLE FRACTURE;  Surgeon: Shona Needles, MD;  Location: [place];  Service: Orthopedics;   Laterality: Left;  . POLYPECTOMY    . TOTAL ABDOMINAL HYSTERECTOMY W/ BILATERAL SALPINGOOPHORECTOMY    . WISDOM TOOTH EXTRACTION      There were no vitals filed for this visit.  Subjective Assessment - 12/29/17 0853    Subjective  I did really well over the weekend.  The doctor prescribed Neurotin and I think it is messing with my stomach.      Currently in Pain?  No/denies                      [facility] Adult PT Treatment/Exercise - 12/29/17 0001      Knee/Hip Exercises: Aerobic   Nustep  L3 x 8 min pt tolerated increased resistance well      Knee/Hip Exercises: Standing   Heel Raises  Both;2 sets;10 reps    Rocker Board  3 minutes    SLS  5x 10 seconds    Rebounder  weight shifting without boot 3 directions x 1 min each    Walking with Sports Cord  20# forward x 10 with CGA by PT for safety      Ankle Exercises: Seated   BAPS  Sitting;Level 2 fwd/back and side to side; circles both ways               PT Short Term Goals - 12/22/17 1159      PT  SHORT TERM GOAL #2   Title  demonstrate symmetry with ambulation on level surface wearing CAM boot    Status  Achieved      PT SHORT TERM GOAL #3   Title  report < or = to 5/10 Lt ankle pain with standing and walking    Status  Achieved      PT SHORT TERM GOAL #4   Title  demonstrate active DF on the Lt to 5 degrees to allow for foot clearance when out of boot    Baseline  0    Time  4    Period  Weeks    Status  On-going    Target Date  01/12/18        PT Long Term Goals - 12/29/17 0853      PT LONG TERM GOAL #5   Title  improve LE strength to walk for 45 minutes without boot  in the community without limitation    Baseline  I haven't tested this    Time  8    Period  Weeks    Status  On-going            Plan - 12/29/17 0854    Clinical Impression Statement  Pt has been doing well out of the boot.  Pt is not sure how long she can walk as she has not tested it.  Pt demonstrates challenge  with proprioception and balance exercises.  Pt is working on Lt ankle strength and propriocetion of the Lt ankle to improve gait, balance and endurance to improve function in the community.      Rehab Potential  Good    PT Frequency  2x / week    PT Duration  8 weeks    PT Treatment/Interventions  ADLs/Self Care Home Management;Cryotherapy;Electrical Stimulation;Gait training;Stair training;Functional mobility training;Therapeutic activities;Therapeutic exercise;Balance training;Patient/family education;Neuromuscular re-education;Manual techniques;Scar mobilization;Passive range of motion;Vasopneumatic Device;Taping    PT Next Visit Plan  Lt ankle proprioception, strength and edema management    Consulted and Agree with Plan of Care  Patient       Patient will benefit from skilled therapeutic intervention in order to improve the following deficits and impairments:  Abnormal gait, Pain, Increased fascial restricitons, Impaired flexibility, Increased edema, Decreased balance, Decreased activity tolerance, Decreased endurance, Decreased range of motion, Decreased strength, Decreased scar mobility  Visit Diagnosis: Other abnormalities of gait and mobility  Muscle weakness (generalized)  Stiffness of left ankle, not elsewhere classified  Pain in left ankle and joints of left foot  Localized edema     Problem List Patient Active Problem List   Diagnosis Date Noted  . Trimalleolar fracture of ankle, closed, left, initial encounter 09/24/2017  . Tibia/fibula fracture 09/23/2017  . Closed left ankle fracture 09/23/2017  . Ankle dislocation, left, initial encounter 09/23/2017  . Thyroid disease   . Sleep apnea   . Osteoporosis   . Type 2 diabetes mellitus ([place])   . Oth intartic fracture of lower end of right radius, init 07/29/2016  . Anal and rectal polyp 12/16/2013    Sigurd Sos, PT 12/29/17 9:21 AM  [facility] [address] [place], [place], [address] Phone: [phone]   Fax:  [phone]  Name: Destiny [name] MRN: [id number] Date of Birth: 23-Jul-1946

## 2018-01-05 ENCOUNTER — Ambulatory Visit: Payer: PPO

## 2018-01-05 DIAGNOSIS — M25672 Stiffness of left ankle, not elsewhere classified: Secondary | ICD-10-CM

## 2018-01-05 DIAGNOSIS — M6281 Muscle weakness (generalized): Secondary | ICD-10-CM

## 2018-01-05 DIAGNOSIS — R600 Localized edema: Secondary | ICD-10-CM

## 2018-01-05 DIAGNOSIS — R2689 Other abnormalities of gait and mobility: Secondary | ICD-10-CM

## 2018-01-05 DIAGNOSIS — M25572 Pain in left ankle and joints of left foot: Secondary | ICD-10-CM

## 2018-01-05 NOTE — Therapy (Signed)
[facility] [address] [place], [place], [address] Phone: [phone]   Fax:  [phone]  Physical Therapy Treatment  Patient Details  Name: Destiny [name] MRN: [id number] Date of Birth: 06-10-1946 Referring Provider: Katha Hamming, MD   Encounter Date: 01/05/2018  PT End of Session - 01/05/18 1053    Visit Number  15    Date for PT Re-Evaluation  02/16/18    Authorization Type  Medicare    Authorization Time Period  KX at 83    PT Start Time  1012    PT Stop Time  1110    PT Time Calculation (min)  58 min    Activity Tolerance  Patient tolerated treatment well    Behavior During Therapy  [facility] for tasks assessed/performed       Past Medical History:  Diagnosis Date  . Allergy   . Ankle dislocation, left, initial encounter 09/23/2017  . Arthritis   . Blood transfusion    in 1980  . Diabetes mellitus   . Osteoporosis    osteopenia  . Sleep apnea    does not use cpap  . Thyroid disease    hypothyroidism    Past Surgical History:  Procedure Laterality Date  . COLONOSCOPY    . EXTERNAL FIXATION LEG Left 09/24/2017   Procedure: EXTERNAL FIXATION ankle;  Surgeon: Shona Needles, MD;  Location: [place];  Service: Orthopedics;  Laterality: Left;  . EXTERNAL FIXATION REMOVAL Left 09/28/2017   Procedure: REMOVAL EXTERNAL FIXATION LEG;  Surgeon: Shona Needles, MD;  Location: [place];  Service: Orthopedics;  Laterality: Left;  . LAPAROSCOPIC GASTRIC BANDING    . OPEN REDUCTION INTERNAL FIXATION (ORIF) DISTAL RADIAL FRACTURE Right 07/29/2016   Procedure: RIGHT OPEN REDUCTION INTERNAL FIXATION (ORIF) DISTAL RADIAL FRACTURE;  Surgeon: Leanora Cover, MD;  Location: [place];  Service: Orthopedics;  Laterality: Right;  . ORIF ANKLE FRACTURE Left 09/28/2017   Procedure: OPEN REDUCTION INTERNAL FIXATION (ORIF) ANKLE FRACTURE;  Surgeon: Shona Needles, MD;  Location: [place];  Service: Orthopedics;   Laterality: Left;  . POLYPECTOMY    . TOTAL ABDOMINAL HYSTERECTOMY W/ BILATERAL SALPINGOOPHORECTOMY    . WISDOM TOOTH EXTRACTION      There were no vitals filed for this visit.  Subjective Assessment - 01/05/18 1014    Subjective  Pt reports that she has some pain with single leg stance.      Currently in Pain?  Yes    Pain Score  1     Pain Location  Ankle    Pain Orientation  Left    Pain Descriptors / Indicators  Burning    Pain Type  Surgical pain    Pain Onset  More than a month ago    Pain Frequency  Intermittent    Aggravating Factors   standing too long, single leg stance on Lt    Pain Relieving Factors  rest, ice                No data recorded       [facility] Adult PT Treatment/Exercise - 01/05/18 0001      Knee/Hip Exercises: Aerobic   Nustep  L2 x 8 min no arms      Knee/Hip Exercises: Standing   Heel Raises  Both;2 sets;10 reps    Step Down  Left;10 reps;Step Height: 6";Hand Hold: 2    Rocker Board  3 minutes    SLS  5x 10  seconds    Rebounder  weight shifting without boot 3 directions x 1 min each    Walking with Sports Cord  20# forward and reverse x 10 with CGA by PT for safety      Vasopneumatic   Number Minutes Vasopneumatic   15 minutes    Vasopnuematic Location   Ankle    Vasopneumatic Pressure  Medium    Vasopneumatic Temperature   3 snowflakes      Ankle Exercises: Seated   BAPS  Sitting;Level 2 fwd/back and side to side; circles both ways      Ankle Exercises: Standing   Balance Beam  4x 20 feet               PT Short Term Goals - 12/22/17 1159      PT SHORT TERM GOAL #2   Title  demonstrate symmetry with ambulation on level surface wearing CAM boot    Status  Achieved      PT SHORT TERM GOAL #3   Title  report < or = to 5/10 Lt ankle pain with standing and walking    Status  Achieved      PT SHORT TERM GOAL #4   Title  demonstrate active DF on the Lt to 5 degrees to allow for foot clearance when out of boot     Baseline  0    Time  4    Period  Weeks    Status  On-going    Target Date  01/12/18        PT Long Term Goals - 01/05/18 1015      PT LONG TERM GOAL #2   Title  reduce FOTO to < or = to 50% limitation    Baseline  54% limitation    Time  8    Period  Weeks    Status  On-going      PT LONG TERM GOAL #5   Title  improve LE strength to walk for 45 minutes without boot  in the community without limitation    Baseline  able to do grocery story 30 minutes    Time  8    Period  Weeks    Status  On-going            Plan - 01/05/18 1017    Clinical Impression Statement  Pt is making progress regarding strength and stability now that she is out of the boot.  Pt reports that Neurotin is helping with nerve pain and she is able to sleep better.  Pt demonstrates challenge wiht prioprioception and balance exercises.  Pt is working on Lt ankle strength and proprioception of the Lt ankle to improve gait, balance and endurance to improve funcition in the community.      Rehab Potential  Good    PT Frequency  2x / week    PT Duration  8 weeks    PT Treatment/Interventions  ADLs/Self Care Home Management;Cryotherapy;Electrical Stimulation;Gait training;Stair training;Functional mobility training;Therapeutic activities;Therapeutic exercise;Balance training;Patient/family education;Neuromuscular re-education;Manual techniques;Scar mobilization;Passive range of motion;Vasopneumatic Device;Taping    PT Next Visit Plan  Lt ankle proprioception, strength and edema management    Consulted and Agree with Plan of Care  Patient       Patient will benefit from skilled therapeutic intervention in order to improve the following deficits and impairments:  Abnormal gait, Pain, Increased fascial restricitons, Impaired flexibility, Increased edema, Decreased balance, Decreased activity tolerance, Decreased endurance, Decreased range of motion, Decreased strength, Decreased scar  mobility  Visit  Diagnosis: Other abnormalities of gait and mobility  Muscle weakness (generalized)  Stiffness of left ankle, not elsewhere classified  Pain in left ankle and joints of left foot  Localized edema     Problem List Patient Active Problem List   Diagnosis Date Noted  . Trimalleolar fracture of ankle, closed, left, initial encounter 09/24/2017  . Tibia/fibula fracture 09/23/2017  . Closed left ankle fracture 09/23/2017  . Ankle dislocation, left, initial encounter 09/23/2017  . Thyroid disease   . Sleep apnea   . Osteoporosis   . Type 2 diabetes mellitus ([place])   . Oth intartic fracture of lower end of right radius, init 07/29/2016  . Anal and rectal polyp 12/16/2013     Sigurd Sos, PT 01/05/18 10:55 AM  [facility] [address] [place], [place], [address] Phone: [phone]   Fax:  [phone]  Name: Destiny [name] MRN: [id number] Date of Birth: 25-Oct-1945

## 2018-01-07 ENCOUNTER — Ambulatory Visit: Payer: PPO

## 2018-01-07 DIAGNOSIS — M6281 Muscle weakness (generalized): Secondary | ICD-10-CM

## 2018-01-07 DIAGNOSIS — M25572 Pain in left ankle and joints of left foot: Secondary | ICD-10-CM

## 2018-01-07 DIAGNOSIS — R2689 Other abnormalities of gait and mobility: Secondary | ICD-10-CM

## 2018-01-07 DIAGNOSIS — R600 Localized edema: Secondary | ICD-10-CM

## 2018-01-07 DIAGNOSIS — M25672 Stiffness of left ankle, not elsewhere classified: Secondary | ICD-10-CM

## 2018-01-07 NOTE — Therapy (Signed)
[facility] [address], [place], [place], [address] Phone: [phone]   Fax:  [phone]  Physical Therapy Treatment  Patient Details  Name: Destiny [name] MRN: [id number] Date of Birth: 1946/06/06 Referring Provider: Katha Hamming, MD   Encounter Date: 01/07/2018  PT End of Session - 01/07/18 1255    Visit Number  16    Date for PT Re-Evaluation  02/16/18    Authorization Type  Medicare    Authorization Time Period  KX at 24    PT Start Time  1228    PT Stop Time  1314    PT Time Calculation (min)  46 min    Activity Tolerance  Patient tolerated treatment well    Behavior During Therapy  [facility] for tasks assessed/performed       Past Medical History:  Diagnosis Date  . Allergy   . Ankle dislocation, left, initial encounter 09/23/2017  . Arthritis   . Blood transfusion    in 1980  . Diabetes mellitus   . Osteoporosis    osteopenia  . Sleep apnea    does not use cpap  . Thyroid disease    hypothyroidism    Past Surgical History:  Procedure Laterality Date  . COLONOSCOPY    . EXTERNAL FIXATION LEG Left 09/24/2017   Procedure: EXTERNAL FIXATION ankle;  Surgeon: Shona Needles, MD;  Location: [place];  Service: Orthopedics;  Laterality: Left;  . EXTERNAL FIXATION REMOVAL Left 09/28/2017   Procedure: REMOVAL EXTERNAL FIXATION LEG;  Surgeon: Shona Needles, MD;  Location: [place];  Service: Orthopedics;  Laterality: Left;  . LAPAROSCOPIC GASTRIC BANDING    . OPEN REDUCTION INTERNAL FIXATION (ORIF) DISTAL RADIAL FRACTURE Right 07/29/2016   Procedure: RIGHT OPEN REDUCTION INTERNAL FIXATION (ORIF) DISTAL RADIAL FRACTURE;  Surgeon: Leanora Cover, MD;  Location: [place];  Service: Orthopedics;  Laterality: Right;  . ORIF ANKLE FRACTURE Left 09/28/2017   Procedure: OPEN REDUCTION INTERNAL FIXATION (ORIF) ANKLE FRACTURE;  Surgeon: Shona Needles, MD;  Location: [place];  Service: Orthopedics;   Laterality: Left;  . POLYPECTOMY    . TOTAL ABDOMINAL HYSTERECTOMY W/ BILATERAL SALPINGOOPHORECTOMY    . WISDOM TOOTH EXTRACTION      There were no vitals filed for this visit.             No data recorded       [facility] Adult PT Treatment/Exercise - 01/07/18 0001      Knee/Hip Exercises: Aerobic   Nustep  L2 x 8 min no arms      Knee/Hip Exercises: Standing   Heel Raises  Both;2 sets;10 reps    Step Down  Left;10 reps;Step Height: 6";Hand Hold: 2    Rocker Board  3 minutes    SLS  5x 10 seconds    Rebounder  weight shifting without boot 3 directions x 1 min each    Walking with Sports Cord  20# forward and reverse x 10, sidestepping 15# x 10 each with CGA by PT for safety      Ankle Exercises: Seated   BAPS  Sitting;Level 2 fwd/back and side to side; circles both ways      Ankle Exercises: Standing   Balance Beam  4x 20 feet               PT Short Term Goals - 12/22/17 1159      PT SHORT TERM GOAL #2   Title  demonstrate symmetry  with ambulation on level surface wearing CAM boot    Status  Achieved      PT SHORT TERM GOAL #3   Title  report < or = to 5/10 Lt ankle pain with standing and walking    Status  Achieved      PT SHORT TERM GOAL #4   Title  demonstrate active DF on the Lt to 5 degrees to allow for foot clearance when out of boot    Baseline  0    Time  4    Period  Weeks    Status  On-going    Target Date  01/12/18        PT Long Term Goals - 01/05/18 1015      PT LONG TERM GOAL #2   Title  reduce FOTO to < or = to 50% limitation    Baseline  54% limitation    Time  8    Period  Weeks    Status  On-going      PT LONG TERM GOAL #5   Title  improve LE strength to walk for 45 minutes without boot  in the community without limitation    Baseline  able to do grocery story 30 minutes    Time  8    Period  Weeks    Status  On-going            Plan - 01/07/18 1251    Clinical Impression Statement  Pt is making steady  progress with strength and stability.  Pt was able to advance to sidestepping with resisted walking.  Pt continues to have nerve pain and is taking Neurotin for this.  Pt is working on LT ankle strength and proprioception to imrpove gait, balance and endurance for community function.      Rehab Potential  Good    PT Frequency  2x / week    PT Duration  8 weeks    PT Treatment/Interventions  ADLs/Self Care Home Management;Cryotherapy;Electrical Stimulation;Gait training;Stair training;Functional mobility training;Therapeutic activities;Therapeutic exercise;Balance training;Patient/family education;Neuromuscular re-education;Manual techniques;Scar mobilization;Passive range of motion;Vasopneumatic Device;Taping    PT Next Visit Plan  Lt ankle proprioception, strength and edema management    Consulted and Agree with Plan of Care  Patient       Patient will benefit from skilled therapeutic intervention in order to improve the following deficits and impairments:  Abnormal gait, Pain, Increased fascial restricitons, Impaired flexibility, Increased edema, Decreased balance, Decreased activity tolerance, Decreased endurance, Decreased range of motion, Decreased strength, Decreased scar mobility  Visit Diagnosis: Other abnormalities of gait and mobility  Muscle weakness (generalized)  Stiffness of left ankle, not elsewhere classified  Pain in left ankle and joints of left foot  Localized edema     Problem List Patient Active Problem List   Diagnosis Date Noted  . Trimalleolar fracture of ankle, closed, left, initial encounter 09/24/2017  . Tibia/fibula fracture 09/23/2017  . Closed left ankle fracture 09/23/2017  . Ankle dislocation, left, initial encounter 09/23/2017  . Thyroid disease   . Sleep apnea   . Osteoporosis   . Type 2 diabetes mellitus ([place])   . Oth intartic fracture of lower end of right radius, init 07/29/2016  . Anal and rectal polyp 12/16/2013   Sigurd Sos,  PT 01/07/18 1:18 PM  [facility] [address], [place], [place], [address] Phone: [phone]   Fax:  [phone]  Name: Destiny [name] MRN: [id number] Date of Birth: 28-Aug-1946

## 2018-01-12 ENCOUNTER — Ambulatory Visit: Payer: PPO | Attending: Student

## 2018-01-12 DIAGNOSIS — M25572 Pain in left ankle and joints of left foot: Secondary | ICD-10-CM

## 2018-01-12 DIAGNOSIS — M6281 Muscle weakness (generalized): Secondary | ICD-10-CM | POA: Diagnosis not present

## 2018-01-12 DIAGNOSIS — R2689 Other abnormalities of gait and mobility: Secondary | ICD-10-CM | POA: Diagnosis not present

## 2018-01-12 DIAGNOSIS — M25672 Stiffness of left ankle, not elsewhere classified: Secondary | ICD-10-CM | POA: Diagnosis not present

## 2018-01-12 DIAGNOSIS — R600 Localized edema: Secondary | ICD-10-CM | POA: Diagnosis not present

## 2018-01-12 NOTE — Therapy (Signed)
[facility] [address] [place], [place], [address] Phone: [phone]   Fax:  [phone]  Physical Therapy Treatment  Patient Details  Name: Destiny [name] MRN: [id number] Date of Birth: 11/12/1945 Referring Provider: Katha Hamming, MD   Encounter Date: 01/12/2018  PT End of Session - 01/12/18 1012    Visit Number  17    Date for PT Re-Evaluation  02/16/18    Authorization Type  Medicare    Authorization Time Period  KX at 45    PT Start Time  0927    PT Stop Time  1010    PT Time Calculation (min)  43 min    Activity Tolerance  Patient tolerated treatment well    Behavior During Therapy  [facility] for tasks assessed/performed       Past Medical History:  Diagnosis Date  . Allergy   . Ankle dislocation, left, initial encounter 09/23/2017  . Arthritis   . Blood transfusion    in 1980  . Diabetes mellitus   . Osteoporosis    osteopenia  . Sleep apnea    does not use cpap  . Thyroid disease    hypothyroidism    Past Surgical History:  Procedure Laterality Date  . COLONOSCOPY    . EXTERNAL FIXATION LEG Left 09/24/2017   Procedure: EXTERNAL FIXATION ankle;  Surgeon: Shona Needles, MD;  Location: [place];  Service: Orthopedics;  Laterality: Left;  . EXTERNAL FIXATION REMOVAL Left 09/28/2017   Procedure: REMOVAL EXTERNAL FIXATION LEG;  Surgeon: Shona Needles, MD;  Location: [place];  Service: Orthopedics;  Laterality: Left;  . LAPAROSCOPIC GASTRIC BANDING    . OPEN REDUCTION INTERNAL FIXATION (ORIF) DISTAL RADIAL FRACTURE Right 07/29/2016   Procedure: RIGHT OPEN REDUCTION INTERNAL FIXATION (ORIF) DISTAL RADIAL FRACTURE;  Surgeon: Leanora Cover, MD;  Location: [place];  Service: Orthopedics;  Laterality: Right;  . ORIF ANKLE FRACTURE Left 09/28/2017   Procedure: OPEN REDUCTION INTERNAL FIXATION (ORIF) ANKLE FRACTURE;  Surgeon: Shona Needles, MD;  Location: [place];  Service: Orthopedics;   Laterality: Left;  . POLYPECTOMY    . TOTAL ABDOMINAL HYSTERECTOMY W/ BILATERAL SALPINGOOPHORECTOMY    . WISDOM TOOTH EXTRACTION      There were no vitals filed for this visit.  Subjective Assessment - 01/12/18 0940    Subjective  Doing well.  Getting stronger.    Patient Stated Goals  walk without boot, improve Lt ankle strength and A/ROM                       [facility] Adult PT Treatment/Exercise - 01/12/18 0001      Knee/Hip Exercises: Aerobic   Nustep  L2 x 8 min no arms      Knee/Hip Exercises: Standing   Heel Raises  Both;2 sets;10 reps on mini tramp    Step Down  Left;10 reps;Step Height: 6";Hand Hold: 2    Rocker Board  3 minutes    SLS  5x 10 seconds    Rebounder  weight shifting without boot 3 directions x 1 min each    Walking with Sports Cord  20# forward and reverse x 10, sidestepping 15# x 10 each with CGA by PT for safety      Ankle Exercises: Seated   BAPS  Sitting;Level 2 fwd/back and side to side; circles both ways      Ankle Exercises: Standing   Balance Beam  4x 20 feet  PT Short Term Goals - 12/22/17 1159      PT SHORT TERM GOAL #2   Title  demonstrate symmetry with ambulation on level surface wearing CAM boot    Status  Achieved      PT SHORT TERM GOAL #3   Title  report < or = to 5/10 Lt ankle pain with standing and walking    Status  Achieved      PT SHORT TERM GOAL #4   Title  demonstrate active DF on the Lt to 5 degrees to allow for foot clearance when out of boot    Baseline  0    Time  4    Period  Weeks    Status  On-going    Target Date  01/12/18        PT Long Term Goals - 01/12/18 0942      PT LONG TERM GOAL #5   Title  improve LE strength to walk for 45 minutes without boot  in the community without limitation    Status  Achieved      PT LONG TERM GOAL #6   Title  improve Lt ankle strength and proprioception to perform single leg stance x 10 seconds on the Lt    Baseline  2.5 seconds     Time  8    Period  Weeks    Status  On-going            Plan - 01/12/18 0945    Clinical Impression Statement  Pt demonstrates mild antalgia today.  Pt is limited to standing x 2.5 seconds on Lt LE with single leg stance.  Pt is able to walk for 1 hour in the community.  Pt demonstrates continued Lt ankle instabilty with eccentric movement and strength deficits with endurance exercises.  Pt will continue to benefit from skilled PT for Lt LE strength, proprioception and gait training.      Rehab Potential  Good    PT Frequency  2x / week    PT Duration  8 weeks    PT Treatment/Interventions  ADLs/Self Care Home Management;Cryotherapy;Electrical Stimulation;Gait training;Stair training;Functional mobility training;Therapeutic activities;Therapeutic exercise;Balance training;Patient/family education;Neuromuscular re-education;Manual techniques;Scar mobilization;Passive range of motion;Vasopneumatic Device;Taping    PT Next Visit Plan  Lt ankle proprioception, strength and edema management    Consulted and Agree with Plan of Care  Patient       Patient will benefit from skilled therapeutic intervention in order to improve the following deficits and impairments:  Abnormal gait, Pain, Increased fascial restricitons, Impaired flexibility, Increased edema, Decreased balance, Decreased activity tolerance, Decreased endurance, Decreased range of motion, Decreased strength, Decreased scar mobility  Visit Diagnosis: Other abnormalities of gait and mobility  Muscle weakness (generalized)  Stiffness of left ankle, not elsewhere classified  Pain in left ankle and joints of left foot  Localized edema     Problem List Patient Active Problem List   Diagnosis Date Noted  . Trimalleolar fracture of ankle, closed, left, initial encounter 09/24/2017  . Tibia/fibula fracture 09/23/2017  . Closed left ankle fracture 09/23/2017  . Ankle dislocation, left, initial encounter 09/23/2017  . Thyroid  disease   . Sleep apnea   . Osteoporosis   . Type 2 diabetes mellitus ([place])   . Oth intartic fracture of lower end of right radius, init 07/29/2016  . Anal and rectal polyp 12/16/2013    Sigurd Sos, PT 01/12/18 10:14 AM  [facility] [address]  [place], [place], [address] Phone: [phone]   Fax:  [phone]  Name: Destiny [name] MRN: [id number] Date of Birth: [birth year]

## 2018-01-14 ENCOUNTER — Ambulatory Visit: Payer: PPO

## 2018-01-14 DIAGNOSIS — M6281 Muscle weakness (generalized): Secondary | ICD-10-CM

## 2018-01-14 DIAGNOSIS — M25672 Stiffness of left ankle, not elsewhere classified: Secondary | ICD-10-CM

## 2018-01-14 DIAGNOSIS — R600 Localized edema: Secondary | ICD-10-CM

## 2018-01-14 DIAGNOSIS — R2689 Other abnormalities of gait and mobility: Secondary | ICD-10-CM

## 2018-01-14 DIAGNOSIS — M25572 Pain in left ankle and joints of left foot: Secondary | ICD-10-CM

## 2018-01-14 NOTE — Therapy (Signed)
[facility] [address], [place], [place], [address] Phone: [phone]   Fax:  [phone]  Physical Therapy Treatment  Patient Details  Name: Destiny [name] MRN: [id number] Date of Birth: [birth year] Referring Provider: Katha Hamming, MD   Encounter Date: 01/14/2018  PT End of Session - 01/14/18 0930    Visit Number  18    Date for PT Re-Evaluation  02/16/18    Authorization Type  Medicare    Authorization Time Period  KX at 29    PT Start Time  0846    PT Stop Time  0928    PT Time Calculation (min)  42 min    Activity Tolerance  Patient tolerated treatment well    Behavior During Therapy  [facility] for tasks assessed/performed       Past Medical History:  Diagnosis Date  . Allergy   . Ankle dislocation, left, initial encounter 09/23/2017  . Arthritis   . Blood transfusion    in 1980  . Diabetes mellitus   . Osteoporosis    osteopenia  . Sleep apnea    does not use cpap  . Thyroid disease    hypothyroidism    Past Surgical History:  Procedure Laterality Date  . COLONOSCOPY    . EXTERNAL FIXATION LEG Left 09/24/2017   Procedure: EXTERNAL FIXATION ankle;  Surgeon: Shona Needles, MD;  Location: [place];  Service: Orthopedics;  Laterality: Left;  . EXTERNAL FIXATION REMOVAL Left 09/28/2017   Procedure: REMOVAL EXTERNAL FIXATION LEG;  Surgeon: Shona Needles, MD;  Location: [place];  Service: Orthopedics;  Laterality: Left;  . LAPAROSCOPIC GASTRIC BANDING    . OPEN REDUCTION INTERNAL FIXATION (ORIF) DISTAL RADIAL FRACTURE Right 07/29/2016   Procedure: RIGHT OPEN REDUCTION INTERNAL FIXATION (ORIF) DISTAL RADIAL FRACTURE;  Surgeon: Leanora Cover, MD;  Location: [place];  Service: Orthopedics;  Laterality: Right;  . ORIF ANKLE FRACTURE Left 09/28/2017   Procedure: OPEN REDUCTION INTERNAL FIXATION (ORIF) ANKLE FRACTURE;  Surgeon: Shona Needles, MD;  Location: [place];  Service: Orthopedics;   Laterality: Left;  . POLYPECTOMY    . TOTAL ABDOMINAL HYSTERECTOMY W/ BILATERAL SALPINGOOPHORECTOMY    . WISDOM TOOTH EXTRACTION      There were no vitals filed for this visit.  Subjective Assessment - 01/14/18 0857    Subjective  I'm doing good.  My ankle was sore yesterday due to stiffness this morning.      Patient Stated Goals  walk without boot, improve Lt ankle strength and A/ROM    Currently in Pain?  No/denies                       [facility] Adult PT Treatment/Exercise - 01/14/18 0001      Knee/Hip Exercises: Aerobic   Nustep  L2 x 8 min no arms      Knee/Hip Exercises: Standing   Heel Raises  Both;2 sets;10 reps on mini tramp    Step Down  Left;10 reps;Step Height: 6";Hand Hold: 2    Rocker Board  3 minutes    SLS  5x 10 seconds    Rebounder  weight shifting without boot 3 directions x 1 min each    Walking with Sports Cord  20# forward and reverse x 10, sidestepping 15# x 10 each with CGA by PT for safety      Ankle Exercises: Seated   BAPS  Sitting;Level 2 fwd/back and side to side; circles  both ways      Ankle Exercises: Standing   Balance Beam  4x 20 feet               PT Short Term Goals - 12/22/17 1159      PT SHORT TERM GOAL #2   Title  demonstrate symmetry with ambulation on level surface wearing CAM boot    Status  Achieved      PT SHORT TERM GOAL #3   Title  report < or = to 5/10 Lt ankle pain with standing and walking    Status  Achieved      PT SHORT TERM GOAL #4   Title  demonstrate active DF on the Lt to 5 degrees to allow for foot clearance when out of boot    Baseline  0    Time  4    Period  Weeks    Status  On-going    Target Date  01/12/18        PT Long Term Goals - 01/12/18 0942      PT LONG TERM GOAL #5   Title  improve LE strength to walk for 45 minutes without boot  in the community without limitation    Status  Achieved      PT LONG TERM GOAL #6   Title  improve Lt ankle strength and proprioception to  perform single leg stance x 10 seconds on the Lt    Baseline  2.5 seconds    Time  8    Period  Weeks    Status  On-going            Plan - 01/14/18 0859    Clinical Impression Statement  Pt continues to have intermittent edema related to activity.  Pt is limited to standing <5 seconds on the Lt with single leg stance.  Pt has improved her ability to walk longer distances in the community.  Pt will continue to benefit from skilled PT for Lt ankle strength, proprioception and gait.    Rehab Potential  Good    PT Frequency  2x / week    PT Duration  8 weeks    PT Treatment/Interventions  ADLs/Self Care Home Management;Cryotherapy;Electrical Stimulation;Gait training;Stair training;Functional mobility training;Therapeutic activities;Therapeutic exercise;Balance training;Patient/family education;Neuromuscular re-education;Manual techniques;Scar mobilization;Passive range of motion;Vasopneumatic Device;Taping    PT Next Visit Plan  Lt ankle proprioception, strength and edema management    Consulted and Agree with Plan of Care  Patient       Patient will benefit from skilled therapeutic intervention in order to improve the following deficits and impairments:  Abnormal gait, Pain, Increased fascial restricitons, Impaired flexibility, Increased edema, Decreased balance, Decreased activity tolerance, Decreased endurance, Decreased range of motion, Decreased strength, Decreased scar mobility  Visit Diagnosis: Other abnormalities of gait and mobility  Muscle weakness (generalized)  Stiffness of left ankle, not elsewhere classified  Pain in left ankle and joints of left foot  Localized edema     Problem List Patient Active Problem List   Diagnosis Date Noted  . Trimalleolar fracture of ankle, closed, left, initial encounter 09/24/2017  . Tibia/fibula fracture 09/23/2017  . Closed left ankle fracture 09/23/2017  . Ankle dislocation, left, initial encounter 09/23/2017  . Thyroid  disease   . Sleep apnea   . Osteoporosis   . Type 2 diabetes mellitus ([place])   . Oth intartic fracture of lower end of right radius, init 07/29/2016  . Anal and rectal polyp 12/16/2013    Claiborne Billings  Garnetta Buddy, PT 01/14/18 9:31 AM  [place] [address], [place], [place], [address] Phone: [phone]   Fax:  [phone]  Name: Destiny [name] MRN: [id number] Date of Birth: 1946/09/16

## 2018-01-19 ENCOUNTER — Ambulatory Visit: Payer: PPO

## 2018-01-19 DIAGNOSIS — M25572 Pain in left ankle and joints of left foot: Secondary | ICD-10-CM

## 2018-01-19 DIAGNOSIS — R2689 Other abnormalities of gait and mobility: Secondary | ICD-10-CM | POA: Diagnosis not present

## 2018-01-19 DIAGNOSIS — M25672 Stiffness of left ankle, not elsewhere classified: Secondary | ICD-10-CM

## 2018-01-19 DIAGNOSIS — M6281 Muscle weakness (generalized): Secondary | ICD-10-CM

## 2018-01-19 NOTE — Therapy (Signed)
[facility] [address], [place], [place], [address] Phone: [phone]   Fax:  [phone]  Physical Therapy Treatment  Patient Details  Name: Destiny [name] MRN: [id number] Date of Birth: Jun 30, 1946 Referring Provider: Katha Hamming, MD   Encounter Date: 01/19/2018  PT End of Session - 01/19/18 1310    Visit Number  19    Date for PT Re-Evaluation  02/16/18    Authorization Type  Medicare    Authorization Time Period  KX now    PT Start Time  1228    PT Stop Time  1308    PT Time Calculation (min)  40 min    Activity Tolerance  Patient tolerated treatment well    Behavior During Therapy  [facility] for tasks assessed/performed       Past Medical History:  Diagnosis Date  . Allergy   . Ankle dislocation, left, initial encounter 09/23/2017  . Arthritis   . Blood transfusion    in 1980  . Diabetes mellitus   . Osteoporosis    osteopenia  . Sleep apnea    does not use cpap  . Thyroid disease    hypothyroidism    Past Surgical History:  Procedure Laterality Date  . COLONOSCOPY    . EXTERNAL FIXATION LEG Left 09/24/2017   Procedure: EXTERNAL FIXATION ankle;  Surgeon: Shona Needles, MD;  Location: [place];  Service: Orthopedics;  Laterality: Left;  . EXTERNAL FIXATION REMOVAL Left 09/28/2017   Procedure: REMOVAL EXTERNAL FIXATION LEG;  Surgeon: Shona Needles, MD;  Location: [place];  Service: Orthopedics;  Laterality: Left;  . LAPAROSCOPIC GASTRIC BANDING    . OPEN REDUCTION INTERNAL FIXATION (ORIF) DISTAL RADIAL FRACTURE Right 07/29/2016   Procedure: RIGHT OPEN REDUCTION INTERNAL FIXATION (ORIF) DISTAL RADIAL FRACTURE;  Surgeon: Leanora Cover, MD;  Location: [place];  Service: Orthopedics;  Laterality: Right;  . ORIF ANKLE FRACTURE Left 09/28/2017   Procedure: OPEN REDUCTION INTERNAL FIXATION (ORIF) ANKLE FRACTURE;  Surgeon: Shona Needles, MD;  Location: [place];  Service: Orthopedics;   Laterality: Left;  . POLYPECTOMY    . TOTAL ABDOMINAL HYSTERECTOMY W/ BILATERAL SALPINGOOPHORECTOMY    . WISDOM TOOTH EXTRACTION      There were no vitals filed for this visit.  Subjective Assessment - 01/19/18 1233    Subjective  I wasnt able to get a regular shoe on due to swelling.      Currently in Pain?  No/denies                       [facility] Adult PT Treatment/Exercise - 01/19/18 0001      Knee/Hip Exercises: Aerobic   Nustep  L2 x 8 min no arms      Knee/Hip Exercises: Standing   Heel Raises  Both;2 sets;10 reps on mini tramp    Step Down  Left;10 reps;Step Height: 6";Hand Hold: 2    Rocker Board  3 minutes    SLS  5x 10 seconds    Rebounder  weight shifting without boot 3 directions x 1 min each    Walking with Sports Cord  20# forward and reverse x 10, sidestepping 20# x 10 each with CGA by PT for safety      Ankle Exercises: Seated   BAPS  Sitting;Level 2 fwd/back and side to side; circles both ways      Ankle Exercises: Standing   Balance Beam  4x 20 feet  PT Short Term Goals - 12/22/17 1159      PT SHORT TERM GOAL #2   Title  demonstrate symmetry with ambulation on level surface wearing CAM boot    Status  Achieved      PT SHORT TERM GOAL #3   Title  report < or = to 5/10 Lt ankle pain with standing and walking    Status  Achieved      PT SHORT TERM GOAL #4   Title  demonstrate active DF on the Lt to 5 degrees to allow for foot clearance when out of boot    Baseline  0    Time  4    Period  Weeks    Status  On-going    Target Date  01/12/18        PT Long Term Goals - 01/12/18 0942      PT LONG TERM GOAL #5   Title  improve LE strength to walk for 45 minutes without boot  in the community without limitation    Status  Achieved      PT LONG TERM GOAL #6   Title  improve Lt ankle strength and proprioception to perform single leg stance x 10 seconds on the Lt    Baseline  2.5 seconds    Time  8    Period   Weeks    Status  On-going            Plan - 01/19/18 1234    Clinical Impression Statement  Pt is having difficulty with shoes due to ankle inflammation.  Pt needs uses rest, ice and elevation to reduce swelling after long periods of standing.  Pt is now walking longer distances in the community due to improved endurance.  Pt requires verbal cues for heel strike when she is fatigued.  Pt demonstrates Lt ankle instability with higher level balance exercises.  Pt requires SBA for safety with balance activities.  Pt will continue to benefit from skilled PT for Lt ankle strength, proprioception and gait.      PT Frequency  2x / week    PT Duration  8 weeks    PT Treatment/Interventions  ADLs/Self Care Home Management;Cryotherapy;Electrical Stimulation;Gait training;Stair training;Functional mobility training;Therapeutic activities;Therapeutic exercise;Balance training;Patient/family education;Neuromuscular re-education;Manual techniques;Scar mobilization;Passive range of motion;Vasopneumatic Device;Taping    PT Next Visit Plan  Lt ankle proprioception, strength and edema management    Consulted and Agree with Plan of Care  Patient       Patient will benefit from skilled therapeutic intervention in order to improve the following deficits and impairments:  Abnormal gait, Pain, Increased fascial restricitons, Impaired flexibility, Increased edema, Decreased balance, Decreased activity tolerance, Decreased endurance, Decreased range of motion, Decreased strength, Decreased scar mobility  Visit Diagnosis: Other abnormalities of gait and mobility  Muscle weakness (generalized)  Stiffness of left ankle, not elsewhere classified  Pain in left ankle and joints of left foot     Problem List Patient Active Problem List   Diagnosis Date Noted  . Trimalleolar fracture of ankle, closed, left, initial encounter 09/24/2017  . Tibia/fibula fracture 09/23/2017  . Closed left ankle fracture  09/23/2017  . Ankle dislocation, left, initial encounter 09/23/2017  . Thyroid disease   . Sleep apnea   . Osteoporosis   . Type 2 diabetes mellitus ([place])   . Oth intartic fracture of lower end of right radius, init 07/29/2016  . Anal and rectal polyp 12/16/2013   Sigurd Sos, PT 01/19/18 1:13 PM  [facility] [address], [place], [place], [address] Phone: [phone]   Fax:  [phone]  Name: Destiny [name] MRN: [id number] Date of Birth: 29-Oct-1945

## 2018-01-21 ENCOUNTER — Ambulatory Visit: Payer: PPO

## 2018-01-21 DIAGNOSIS — M6281 Muscle weakness (generalized): Secondary | ICD-10-CM

## 2018-01-21 DIAGNOSIS — R2689 Other abnormalities of gait and mobility: Secondary | ICD-10-CM | POA: Diagnosis not present

## 2018-01-21 DIAGNOSIS — M25672 Stiffness of left ankle, not elsewhere classified: Secondary | ICD-10-CM

## 2018-01-21 DIAGNOSIS — M25572 Pain in left ankle and joints of left foot: Secondary | ICD-10-CM

## 2018-01-21 NOTE — Therapy (Signed)
[facility] [address] [place], [place], [address] Phone: [phone]   Fax:  [phone]  Physical Therapy Treatment  Patient Details  Name: Destiny [name] MRN: [id number] Date of Birth: Jan 05, 1946 Referring Provider: Katha Hamming, MD   Encounter Date: 01/21/2018  PT End of Session - 01/21/18 1009    Visit Number  20    Date for PT Re-Evaluation  02/16/18    Authorization Type  Medicare    Authorization Time Period  KX now    PT Start Time  0925    PT Stop Time  1011    PT Time Calculation (min)  46 min    Activity Tolerance  Patient tolerated treatment well    Behavior During Therapy  [facility] for tasks assessed/performed       Past Medical History:  Diagnosis Date  . Allergy   . Ankle dislocation, left, initial encounter 09/23/2017  . Arthritis   . Blood transfusion    in 1980  . Diabetes mellitus   . Osteoporosis    osteopenia  . Sleep apnea    does not use cpap  . Thyroid disease    hypothyroidism    Past Surgical History:  Procedure Laterality Date  . COLONOSCOPY    . EXTERNAL FIXATION LEG Left 09/24/2017   Procedure: EXTERNAL FIXATION ankle;  Surgeon: Shona Needles, MD;  Location: [place];  Service: Orthopedics;  Laterality: Left;  . EXTERNAL FIXATION REMOVAL Left 09/28/2017   Procedure: REMOVAL EXTERNAL FIXATION LEG;  Surgeon: Shona Needles, MD;  Location: [place];  Service: Orthopedics;  Laterality: Left;  . LAPAROSCOPIC GASTRIC BANDING    . OPEN REDUCTION INTERNAL FIXATION (ORIF) DISTAL RADIAL FRACTURE Right 07/29/2016   Procedure: RIGHT OPEN REDUCTION INTERNAL FIXATION (ORIF) DISTAL RADIAL FRACTURE;  Surgeon: Leanora Cover, MD;  Location: [place];  Service: Orthopedics;  Laterality: Right;  . ORIF ANKLE FRACTURE Left 09/28/2017   Procedure: OPEN REDUCTION INTERNAL FIXATION (ORIF) ANKLE FRACTURE;  Surgeon: Shona Needles, MD;  Location: [place];  Service: Orthopedics;   Laterality: Left;  . POLYPECTOMY    . TOTAL ABDOMINAL HYSTERECTOMY W/ BILATERAL SALPINGOOPHORECTOMY    . WISDOM TOOTH EXTRACTION      There were no vitals filed for this visit.  Subjective Assessment - 01/21/18 0940    Subjective  I noticed the other day that I can see the bones in my foot when I move my foot.      Pertinent History  Lt ankle ORIF: 09/28/17    Currently in Pain?  No/denies                       [facility] Adult PT Treatment/Exercise - 01/21/18 0001      Knee/Hip Exercises: Aerobic   Nustep  L2 x 8 min no arms      Knee/Hip Exercises: Standing   Heel Raises  Both;2 sets;10 reps on mini tramp    Step Down  Left;10 reps;Step Height: 6";Hand Hold: 2    Rocker Board  3 minutes    SLS  5x 10 seconds    Rebounder  weight shifting without boot 3 directions x 1 min each, tandem stance on mini tramp 3x20 seconds    Walking with Sports Cord  20# forward and reverse x 10, sidestepping 20# x 10 each with CGA by PT for safety      Ankle Exercises: Seated   BAPS  Sitting;Level 2 fwd/back  and side to side; circles both ways      Ankle Exercises: Standing   Balance Beam  4x 20 feet               PT Short Term Goals - 12/22/17 1159      PT SHORT TERM GOAL #2   Title  demonstrate symmetry with ambulation on level surface wearing CAM boot    Status  Achieved      PT SHORT TERM GOAL #3   Title  report < or = to 5/10 Lt ankle pain with standing and walking    Status  Achieved      PT SHORT TERM GOAL #4   Title  demonstrate active DF on the Lt to 5 degrees to allow for foot clearance when out of boot    Baseline  0    Time  4    Period  Weeks    Status  On-going    Target Date  01/12/18        PT Long Term Goals - 01/12/18 0942      PT LONG TERM GOAL #5   Title  improve LE strength to walk for 45 minutes without boot  in the community without limitation    Status  Achieved      PT LONG TERM GOAL #6   Title  improve Lt ankle strength and  proprioception to perform single leg stance x 10 seconds on the Lt    Baseline  2.5 seconds    Time  8    Period  Weeks    Status  On-going            Plan - 01/21/18 0943    Clinical Impression Statement  Pt is making steady progress regarding Lt ankle endurance and stability.  Pt able to perform tandem stance on the mini tramp x 20 seconds without UE support.  Pt requires verbal cues for heel strike when she is fatigued.  Pt requires SBA for safety with balance activities.  Pt will continue to benefit from skilled PT for Lt ankle strength, proprioception and gait.      Rehab Potential  Good    PT Frequency  2x / week    PT Duration  8 weeks    PT Treatment/Interventions  ADLs/Self Care Home Management;Cryotherapy;Electrical Stimulation;Gait training;Stair training;Functional mobility training;Therapeutic activities;Therapeutic exercise;Balance training;Patient/family education;Neuromuscular re-education;Manual techniques;Scar mobilization;Passive range of motion;Vasopneumatic Device;Taping    PT Next Visit Plan  Lt ankle proprioception, strength and edema management    Consulted and Agree with Plan of Care  Patient       Patient will benefit from skilled therapeutic intervention in order to improve the following deficits and impairments:  Abnormal gait, Pain, Increased fascial restricitons, Impaired flexibility, Increased edema, Decreased balance, Decreased activity tolerance, Decreased endurance, Decreased range of motion, Decreased strength, Decreased scar mobility  Visit Diagnosis: Other abnormalities of gait and mobility  Muscle weakness (generalized)  Stiffness of left ankle, not elsewhere classified  Pain in left ankle and joints of left foot     Problem List Patient Active Problem List   Diagnosis Date Noted  . Trimalleolar fracture of ankle, closed, left, initial encounter 09/24/2017  . Tibia/fibula fracture 09/23/2017  . Closed left ankle fracture 09/23/2017  .  Ankle dislocation, left, initial encounter 09/23/2017  . Thyroid disease   . Sleep apnea   . Osteoporosis   . Type 2 diabetes mellitus ([place])   . Oth intartic fracture of lower end  of right radius, init 07/29/2016  . Anal and rectal polyp 12/16/2013    Sigurd Sos, PT 01/21/18 10:10 AM  [facility] [address], [place], [place], [address] Phone: [phone]   Fax:  [phone]  Name: LISSETTE SCHENK MRN: [id number] Date of Birth: Dec 03, 1945

## 2018-01-26 ENCOUNTER — Ambulatory Visit: Payer: PPO

## 2018-01-26 DIAGNOSIS — M25672 Stiffness of left ankle, not elsewhere classified: Secondary | ICD-10-CM

## 2018-01-26 DIAGNOSIS — M25572 Pain in left ankle and joints of left foot: Secondary | ICD-10-CM

## 2018-01-26 DIAGNOSIS — R2689 Other abnormalities of gait and mobility: Secondary | ICD-10-CM | POA: Diagnosis not present

## 2018-01-26 DIAGNOSIS — M6281 Muscle weakness (generalized): Secondary | ICD-10-CM

## 2018-01-26 NOTE — Therapy (Signed)
[facility] [address] [place], [place], [address] Phone: [phone]   Fax:  [phone]  Physical Therapy Treatment  Patient Details  Name: Destiny [name] MRN: [id number] Date of Birth: Oct 20, 1945 Referring Provider: Katha Hamming, MD   Encounter Date: 01/26/2018  PT End of Session - 01/26/18 1307    Visit Number  21    Date for PT Re-Evaluation  02/16/18    Authorization Type  Medicare    Authorization Time Period  KX now    PT Start Time  1229    PT Stop Time  1311    PT Time Calculation (min)  42 min    Activity Tolerance  Patient tolerated treatment well    Behavior During Therapy  [facility] for tasks assessed/performed       Past Medical History:  Diagnosis Date  . Allergy   . Ankle dislocation, left, initial encounter 09/23/2017  . Arthritis   . Blood transfusion    in 1980  . Diabetes mellitus   . Osteoporosis    osteopenia  . Sleep apnea    does not use cpap  . Thyroid disease    hypothyroidism    Past Surgical History:  Procedure Laterality Date  . COLONOSCOPY    . EXTERNAL FIXATION LEG Left 09/24/2017   Procedure: EXTERNAL FIXATION ankle;  Surgeon: Shona Needles, MD;  Location: [place];  Service: Orthopedics;  Laterality: Left;  . EXTERNAL FIXATION REMOVAL Left 09/28/2017   Procedure: REMOVAL EXTERNAL FIXATION LEG;  Surgeon: Shona Needles, MD;  Location: [place];  Service: Orthopedics;  Laterality: Left;  . LAPAROSCOPIC GASTRIC BANDING    . OPEN REDUCTION INTERNAL FIXATION (ORIF) DISTAL RADIAL FRACTURE Right 07/29/2016   Procedure: RIGHT OPEN REDUCTION INTERNAL FIXATION (ORIF) DISTAL RADIAL FRACTURE;  Surgeon: Leanora Cover, MD;  Location: [place];  Service: Orthopedics;  Laterality: Right;  . ORIF ANKLE FRACTURE Left 09/28/2017   Procedure: OPEN REDUCTION INTERNAL FIXATION (ORIF) ANKLE FRACTURE;  Surgeon: Shona Needles, MD;  Location: [place];  Service: Orthopedics;   Laterality: Left;  . POLYPECTOMY    . TOTAL ABDOMINAL HYSTERECTOMY W/ BILATERAL SALPINGOOPHORECTOMY    . WISDOM TOOTH EXTRACTION      There were no vitals filed for this visit.  Subjective Assessment - 01/26/18 1235    Subjective  I am doing well.  Better each day.     Currently in Pain?  No/denies                       [facility] Adult PT Treatment/Exercise - 01/26/18 0001      Knee/Hip Exercises: Aerobic   Nustep  L2 x 8 min no arms.      Knee/Hip Exercises: Standing   Heel Raises  Both;2 sets;10 reps on mini tramp    Step Down  Left;10 reps;Step Height: 6";Hand Hold: 2    Rocker Board  3 minutes    SLS  5x 10 seconds    Rebounder  weight shifting without boot 3 directions x 1 min each, tandem stance on mini tramp 3x20 seconds    Walking with Sports Cord  20# forward and reverse x 10, sidestepping 20# x 10 each with CGA by PT for safety      Ankle Exercises: Seated   BAPS  Level 2;Standing fwd/back and side to side; circles both ways      Ankle Exercises: Standing   Balance Beam  4x 20  feet               PT Short Term Goals - 12/22/17 1159      PT SHORT TERM GOAL #2   Title  demonstrate symmetry with ambulation on level surface wearing CAM boot    Status  Achieved      PT SHORT TERM GOAL #3   Title  report < or = to 5/10 Lt ankle pain with standing and walking    Status  Achieved      PT SHORT TERM GOAL #4   Title  demonstrate active DF on the Lt to 5 degrees to allow for foot clearance when out of boot    Baseline  0    Time  4    Period  Weeks    Status  On-going    Target Date  01/12/18        PT Long Term Goals - 01/12/18 0942      PT LONG TERM GOAL #5   Title  improve LE strength to walk for 45 minutes without boot  in the community without limitation    Status  Achieved      PT LONG TERM GOAL #6   Title  improve Lt ankle strength and proprioception to perform single leg stance x 10 seconds on the Lt    Baseline  2.5 seconds     Time  8    Period  Weeks    Status  On-going            Plan - 01/26/18 1240    Clinical Impression Statement  Pt continues to demonstrate improved ankle strength and endurance.  Pt with improved proprioception and able to perform tandem stance x 20 seconds without UE support.  Pt requires SBA for safety with balance and high level proprioception activities.  Pt will continue to benefit from skilled PT for Lt ankle strength, proprioception and gait.    Rehab Potential  Good    PT Frequency  2x / week    PT Duration  8 weeks    PT Treatment/Interventions  ADLs/Self Care Home Management;Cryotherapy;Electrical Stimulation;Gait training;Stair training;Functional mobility training;Therapeutic activities;Therapeutic exercise;Balance training;Patient/family education;Neuromuscular re-education;Manual techniques;Scar mobilization;Passive range of motion;Vasopneumatic Device;Taping    PT Next Visit Plan  Lt ankle proprioception, strength and edema management    Consulted and Agree with Plan of Care  Patient       Patient will benefit from skilled therapeutic intervention in order to improve the following deficits and impairments:  Abnormal gait, Pain, Increased fascial restricitons, Impaired flexibility, Increased edema, Decreased balance, Decreased activity tolerance, Decreased endurance, Decreased range of motion, Decreased strength, Decreased scar mobility  Visit Diagnosis: Other abnormalities of gait and mobility  Muscle weakness (generalized)  Stiffness of left ankle, not elsewhere classified  Pain in left ankle and joints of left foot     Problem List Patient Active Problem List   Diagnosis Date Noted  . Trimalleolar fracture of ankle, closed, left, initial encounter 09/24/2017  . Tibia/fibula fracture 09/23/2017  . Closed left ankle fracture 09/23/2017  . Ankle dislocation, left, initial encounter 09/23/2017  . Thyroid disease   . Sleep apnea   . Osteoporosis   . Type 2  diabetes mellitus ([place])   . Oth intartic fracture of lower end of right radius, init 07/29/2016  . Anal and rectal polyp 12/16/2013     Sigurd Sos, PT 01/26/18 1:19 PM  [facility] [address]  [place], [place], [address] Phone: [phone]   Fax:  [phone]  Name: Destiny [name] MRN: [id number] Date of Birth: 1946/02/10

## 2018-01-28 ENCOUNTER — Telehealth: Payer: Self-pay

## 2018-01-28 ENCOUNTER — Ambulatory Visit: Payer: PPO

## 2018-01-28 NOTE — Telephone Encounter (Signed)
PT called pt due to missed appointment today.  Pt thought her appointment was at a later time.  PT confirmed appt for next week.

## 2018-02-02 ENCOUNTER — Ambulatory Visit: Payer: PPO

## 2018-02-02 DIAGNOSIS — M25672 Stiffness of left ankle, not elsewhere classified: Secondary | ICD-10-CM

## 2018-02-02 DIAGNOSIS — M6281 Muscle weakness (generalized): Secondary | ICD-10-CM

## 2018-02-02 DIAGNOSIS — M25572 Pain in left ankle and joints of left foot: Secondary | ICD-10-CM

## 2018-02-02 DIAGNOSIS — R2689 Other abnormalities of gait and mobility: Secondary | ICD-10-CM

## 2018-02-02 NOTE — Therapy (Signed)
[facility] [address], [place], [place], [address] Phone: [phone]   Fax:  [phone]  Physical Therapy Treatment  Patient Details  Name: Destiny [name] MRN: [id number] Date of Birth: 1946/04/01 Referring Provider: Katha Hamming, MD   Encounter Date: 02/02/2018  PT End of Session - 02/02/18 1301    Visit Number  22    Date for PT Re-Evaluation  02/16/18    Authorization Type  Medicare    Authorization Time Period  KX now    PT Start Time  1225    PT Stop Time  1307    PT Time Calculation (min)  42 min    Activity Tolerance  Patient tolerated treatment well    Behavior During Therapy  [facility] for tasks assessed/performed       Past Medical History:  Diagnosis Date  . Allergy   . Ankle dislocation, left, initial encounter 09/23/2017  . Arthritis   . Blood transfusion    in 1980  . Diabetes mellitus   . Osteoporosis    osteopenia  . Sleep apnea    does not use cpap  . Thyroid disease    hypothyroidism    Past Surgical History:  Procedure Laterality Date  . COLONOSCOPY    . EXTERNAL FIXATION LEG Left 09/24/2017   Procedure: EXTERNAL FIXATION ankle;  Surgeon: Shona Needles, MD;  Location: [place];  Service: Orthopedics;  Laterality: Left;  . EXTERNAL FIXATION REMOVAL Left 09/28/2017   Procedure: REMOVAL EXTERNAL FIXATION LEG;  Surgeon: Shona Needles, MD;  Location: [place];  Service: Orthopedics;  Laterality: Left;  . LAPAROSCOPIC GASTRIC BANDING    . OPEN REDUCTION INTERNAL FIXATION (ORIF) DISTAL RADIAL FRACTURE Right 07/29/2016   Procedure: RIGHT OPEN REDUCTION INTERNAL FIXATION (ORIF) DISTAL RADIAL FRACTURE;  Surgeon: Leanora Cover, MD;  Location: [place];  Service: Orthopedics;  Laterality: Right;  . ORIF ANKLE FRACTURE Left 09/28/2017   Procedure: OPEN REDUCTION INTERNAL FIXATION (ORIF) ANKLE FRACTURE;  Surgeon: Shona Needles, MD;  Location: [place];  Service: Orthopedics;   Laterality: Left;  . POLYPECTOMY    . TOTAL ABDOMINAL HYSTERECTOMY W/ BILATERAL SALPINGOOPHORECTOMY    . WISDOM TOOTH EXTRACTION      There were no vitals filed for this visit.  Subjective Assessment - 02/02/18 1232    Subjective  No pain today.  Last night, i had a lot of pain.  Pain was up to 7/10- I was on it a lot.    Pertinent History  Lt ankle ORIF: 09/28/17    Patient Stated Goals  walk without boot, improve Lt ankle strength and A/ROM    Currently in Pain?  No/denies         [facility] PT Assessment - 02/02/18 0001      Observation/Other Assessments   Focus on Therapeutic Outcomes (FOTO)   30% limitation      Ambulation/Gait   Pre-Gait Activities  single leg stance x 8 seconds without UE support                   [facility] Adult PT Treatment/Exercise - 02/02/18 0001      Knee/Hip Exercises: Aerobic   Nustep  L2 x 8 min no arms.      Knee/Hip Exercises: Standing   Heel Raises  Both;2 sets;10 reps on mini tramp    Step Down  Left;10 reps;Step Height: 6";Hand Hold: 2    Rocker Board  3 minutes  SLS  5x 10 seconds    Rebounder  weight shifting without boot 3 directions x 1 min each, tandem stance on mini tramp 3x20 seconds    Walking with Sports Cord  25# forward and reverse x 10, sidestepping 20# x 10 each with CGA by PT for safety      Ankle Exercises: Seated   BAPS  Level 2;Standing fwd/back and side to side; circles both ways      Ankle Exercises: Standing   Balance Beam  4x 20 feet               PT Short Term Goals - 12/22/17 1159      PT SHORT TERM GOAL #2   Title  demonstrate symmetry with ambulation on level surface wearing CAM boot    Status  Achieved      PT SHORT TERM GOAL #3   Title  report < or = to 5/10 Lt ankle pain with standing and walking    Status  Achieved      PT SHORT TERM GOAL #4   Title  demonstrate active DF on the Lt to 5 degrees to allow for foot clearance when out of boot    Baseline  0    Time  4    Period   Weeks    Status  On-going    Target Date  01/12/18        PT Long Term Goals - 02/02/18 1235      PT LONG TERM GOAL #2   Title  reduce FOTO to < or = to 50% limitation      PT LONG TERM GOAL #3   Title  report < or = to 3/10 Lt ankle pain with standing and walking    Baseline  can be up to 7/10    Time  8    Period  Weeks    Status  On-going      PT LONG TERM GOAL #5   Title  improve LE strength to walk for 45 minutes without boot  in the community without limitation    Status  Achieved      PT LONG TERM GOAL #6   Title  improve Lt ankle strength and proprioception to perform single leg stance x 10 seconds on the Lt    Baseline  8 seconds    Time  8    Period  Weeks    Status  On-going            Plan - 02/02/18 1245    Clinical Impression Statement  Pt is making stead progress toward goals.  FOTO is 30% limitation today (54% last time tested).  Pt is able to perform single leg stance x 8 seconds without upper extremity support.  Pt will continue to benefit from skilled PT for strength, endurance and proproiception of Lt ankle.    Rehab Potential  Good    PT Frequency  2x / week    PT Duration  8 weeks    PT Treatment/Interventions  ADLs/Self Care Home Management;Cryotherapy;Electrical Stimulation;Gait training;Stair training;Functional mobility training;Therapeutic activities;Therapeutic exercise;Balance training;Patient/family education;Neuromuscular re-education;Manual techniques;Scar mobilization;Passive range of motion;Vasopneumatic Device;Taping    PT Next Visit Plan  Lt ankle proprioception, strength and edema management    Consulted and Agree with Plan of Care  Patient       Patient will benefit from skilled therapeutic intervention in order to improve the following deficits and impairments:  Abnormal gait, Pain, Increased fascial restricitons,  Impaired flexibility, Increased edema, Decreased balance, Decreased activity tolerance, Decreased endurance, Decreased  range of motion, Decreased strength, Decreased scar mobility  Visit Diagnosis: Other abnormalities of gait and mobility  Muscle weakness (generalized)  Stiffness of left ankle, not elsewhere classified  Pain in left ankle and joints of left foot     Problem List Patient Active Problem List   Diagnosis Date Noted  . Trimalleolar fracture of ankle, closed, left, initial encounter 09/24/2017  . Tibia/fibula fracture 09/23/2017  . Closed left ankle fracture 09/23/2017  . Ankle dislocation, left, initial encounter 09/23/2017  . Thyroid disease   . Sleep apnea   . Osteoporosis   . Type 2 diabetes mellitus ([place])   . Oth intartic fracture of lower end of right radius, init 07/29/2016  . Anal and rectal polyp 12/16/2013     Sigurd Sos, PT 02/02/18 1:14 PM  [facility] [address] [place], [place], [address] Phone: [phone]   Fax:  [phone]  Name: Destiny [name] MRN: [id number] Date of Birth: [birth year]

## 2018-02-09 ENCOUNTER — Ambulatory Visit: Payer: PPO

## 2018-02-09 DIAGNOSIS — R2689 Other abnormalities of gait and mobility: Secondary | ICD-10-CM

## 2018-02-09 DIAGNOSIS — M25672 Stiffness of left ankle, not elsewhere classified: Secondary | ICD-10-CM

## 2018-02-09 DIAGNOSIS — M6281 Muscle weakness (generalized): Secondary | ICD-10-CM

## 2018-02-09 NOTE — Therapy (Signed)
[facility] [address], [place], [place], [address] Phone: [phone]   Fax:  [phone]  Physical Therapy Treatment  Patient Details  Name: Destiny [name] MRN: [id number] Date of Birth: 27-Feb-1946 Referring Provider: Katha Hamming, MD   Encounter Date: 02/09/2018  PT End of Session - 02/09/18 1306    Visit Number  23    Date for PT Re-Evaluation  02/16/18    Authorization Type  Medicare    Authorization Time Period  KX now    PT Start Time  1224    PT Stop Time  1307    PT Time Calculation (min)  43 min    Activity Tolerance  Patient tolerated treatment well    Behavior During Therapy  [facility] for tasks assessed/performed       Past Medical History:  Diagnosis Date  . Allergy   . Ankle dislocation, left, initial encounter 09/23/2017  . Arthritis   . Blood transfusion    in 1980  . Diabetes mellitus   . Osteoporosis    osteopenia  . Sleep apnea    does not use cpap  . Thyroid disease    hypothyroidism    Past Surgical History:  Procedure Laterality Date  . COLONOSCOPY    . EXTERNAL FIXATION LEG Left 09/24/2017   Procedure: EXTERNAL FIXATION ankle;  Surgeon: Shona Needles, MD;  Location: [place];  Service: Orthopedics;  Laterality: Left;  . EXTERNAL FIXATION REMOVAL Left 09/28/2017   Procedure: REMOVAL EXTERNAL FIXATION LEG;  Surgeon: Shona Needles, MD;  Location: [place];  Service: Orthopedics;  Laterality: Left;  . LAPAROSCOPIC GASTRIC BANDING    . OPEN REDUCTION INTERNAL FIXATION (ORIF) DISTAL RADIAL FRACTURE Right 07/29/2016   Procedure: RIGHT OPEN REDUCTION INTERNAL FIXATION (ORIF) DISTAL RADIAL FRACTURE;  Surgeon: Leanora Cover, MD;  Location: [place];  Service: Orthopedics;  Laterality: Right;  . ORIF ANKLE FRACTURE Left 09/28/2017   Procedure: OPEN REDUCTION INTERNAL FIXATION (ORIF) ANKLE FRACTURE;  Surgeon: Shona Needles, MD;  Location: [place];  Service: Orthopedics;   Laterality: Left;  . POLYPECTOMY    . TOTAL ABDOMINAL HYSTERECTOMY W/ BILATERAL SALPINGOOPHORECTOMY    . WISDOM TOOTH EXTRACTION      There were no vitals filed for this visit.  Subjective Assessment - 02/09/18 1228    Subjective  I had increased pain last night due to overdoing it.  Pain was up to 7/10.  I took a pain pill and it went away quickly.    Pertinent History  Lt ankle ORIF: 09/28/17    Currently in Pain?  No/denies                       [facility] Adult PT Treatment/Exercise - 02/09/18 0001      Knee/Hip Exercises: Aerobic   Nustep  L2 x 8 min no arms.      Knee/Hip Exercises: Standing   Heel Raises  Both;2 sets;10 reps on mini tramp    Rocker Board  3 minutes    SLS  5x 10 seconds    Rebounder  weight shifting without boot 3 directions x 1 min each, tandem stance on mini tramp 3x20 seconds    Walking with Sports Cord  25# forward and reverse x 10, sidestepping 20# x 10 each with CGA by PT for safety      Ankle Exercises: Seated   BAPS  Level 2;Standing fwd/back and side to side; circles both  ways      Ankle Exercises: Standing   SLS  on green pod 3x20 seconds               PT Short Term Goals - 12/22/17 1159      PT SHORT TERM GOAL #2   Title  demonstrate symmetry with ambulation on level surface wearing CAM boot    Status  Achieved      PT SHORT TERM GOAL #3   Title  report < or = to 5/10 Lt ankle pain with standing and walking    Status  Achieved      PT SHORT TERM GOAL #4   Title  demonstrate active DF on the Lt to 5 degrees to allow for foot clearance when out of boot    Baseline  0    Time  4    Period  Weeks    Status  On-going    Target Date  01/12/18        PT Long Term Goals - 02/02/18 1235      PT LONG TERM GOAL #2   Title  reduce FOTO to < or = to 50% limitation      PT LONG TERM GOAL #3   Title  report < or = to 3/10 Lt ankle pain with standing and walking    Baseline  can be up to 7/10    Time  8    Period   Weeks    Status  On-going      PT LONG TERM GOAL #5   Title  improve LE strength to walk for 45 minutes without boot  in the community without limitation    Status  Achieved      PT LONG TERM GOAL #6   Title  improve Lt ankle strength and proprioception to perform single leg stance x 10 seconds on the Lt    Baseline  8 seconds    Time  8    Period  Weeks    Status  On-going            Plan - 02/09/18 1228    Clinical Impression Statement  Pt continues to make steady progress regarding gait, proprioception and mobility.   Single leg stance has improved to 8 seconds without UE support.  FOTO is improved to 30% limitation, an improve ment from 54% most previously.  Pt sometimes has to limit her activity due to endurance deficits and edema with increased standing.  Pt with fewer guarding and verbal cues with exercise in the clinic today.  Pt will continue to beneift from skilled PT for Lt LE strength, endurance, proprioception and flexibility.      Rehab Potential  Good    PT Frequency  2x / week    PT Duration  8 weeks    PT Treatment/Interventions  ADLs/Self Care Home Management;Cryotherapy;Electrical Stimulation;Gait training;Stair training;Functional mobility training;Therapeutic activities;Therapeutic exercise;Balance training;Patient/family education;Neuromuscular re-education;Manual techniques;Scar mobilization;Passive range of motion;Vasopneumatic Device;Taping    PT Next Visit Plan  Lt ankle proprioception, strength and edema management    Consulted and Agree with Plan of Care  Patient       Patient will benefit from skilled therapeutic intervention in order to improve the following deficits and impairments:  Abnormal gait, Pain, Increased fascial restricitons, Impaired flexibility, Increased edema, Decreased balance, Decreased activity tolerance, Decreased endurance, Decreased range of motion, Decreased strength, Decreased scar mobility  Visit Diagnosis: Other abnormalities  of gait and mobility  Muscle weakness (generalized)  Stiffness of left ankle, not elsewhere classified     Problem List Patient Active Problem List   Diagnosis Date Noted  . Trimalleolar fracture of ankle, closed, left, initial encounter 09/24/2017  . Tibia/fibula fracture 09/23/2017  . Closed left ankle fracture 09/23/2017  . Ankle dislocation, left, initial encounter 09/23/2017  . Thyroid disease   . Sleep apnea   . Osteoporosis   . Type 2 diabetes mellitus ([place])   . Oth intartic fracture of lower end of right radius, init 07/29/2016  . Anal and rectal polyp 12/16/2013    Sigurd Sos, PT 02/09/18 1:10 PM  [facility] [address], [place], [place], [address] Phone: [phone]   Fax:  [phone]  Name: Destiny [name] MRN: [id number] Date of Birth: 1946/02/03

## 2018-02-11 ENCOUNTER — Ambulatory Visit: Payer: PPO | Attending: Student

## 2018-02-11 DIAGNOSIS — M25672 Stiffness of left ankle, not elsewhere classified: Secondary | ICD-10-CM

## 2018-02-11 DIAGNOSIS — R2689 Other abnormalities of gait and mobility: Secondary | ICD-10-CM | POA: Insufficient documentation

## 2018-02-11 DIAGNOSIS — R600 Localized edema: Secondary | ICD-10-CM | POA: Diagnosis not present

## 2018-02-11 DIAGNOSIS — M25572 Pain in left ankle and joints of left foot: Secondary | ICD-10-CM | POA: Diagnosis not present

## 2018-02-11 DIAGNOSIS — M6281 Muscle weakness (generalized): Secondary | ICD-10-CM | POA: Insufficient documentation

## 2018-02-11 NOTE — Therapy (Signed)
[facility] [address], [place], [place], [address] Phone: [phone]   Fax:  [phone]  Physical Therapy Treatment  Patient Details  Name: Destiny [name] MRN: [id number] Date of Birth: 08-15-1946 Referring Provider: Katha Hamming, MD   Encounter Date: 02/11/2018  PT End of Session - 02/11/18 1252    Visit Number  24    Date for PT Re-Evaluation  02/16/18    Authorization Type  Medicare    Authorization Time Period  KX now    PT Start Time  1220    PT Stop Time  1301    PT Time Calculation (min)  41 min    Activity Tolerance  Patient tolerated treatment well    Behavior During Therapy  [facility] for tasks assessed/performed       Past Medical History:  Diagnosis Date  . Allergy   . Ankle dislocation, left, initial encounter 09/23/2017  . Arthritis   . Blood transfusion    in 1980  . Diabetes mellitus   . Osteoporosis    osteopenia  . Sleep apnea    does not use cpap  . Thyroid disease    hypothyroidism    Past Surgical History:  Procedure Laterality Date  . COLONOSCOPY    . EXTERNAL FIXATION LEG Left 09/24/2017   Procedure: EXTERNAL FIXATION ankle;  Surgeon: Shona Needles, MD;  Location: [place];  Service: Orthopedics;  Laterality: Left;  . EXTERNAL FIXATION REMOVAL Left 09/28/2017   Procedure: REMOVAL EXTERNAL FIXATION LEG;  Surgeon: Shona Needles, MD;  Location: [place];  Service: Orthopedics;  Laterality: Left;  . LAPAROSCOPIC GASTRIC BANDING    . OPEN REDUCTION INTERNAL FIXATION (ORIF) DISTAL RADIAL FRACTURE Right 07/29/2016   Procedure: RIGHT OPEN REDUCTION INTERNAL FIXATION (ORIF) DISTAL RADIAL FRACTURE;  Surgeon: Leanora Cover, MD;  Location: [place];  Service: Orthopedics;  Laterality: Right;  . ORIF ANKLE FRACTURE Left 09/28/2017   Procedure: OPEN REDUCTION INTERNAL FIXATION (ORIF) ANKLE FRACTURE;  Surgeon: Shona Needles, MD;  Location: [place];  Service: Orthopedics;   Laterality: Left;  . POLYPECTOMY    . TOTAL ABDOMINAL HYSTERECTOMY W/ BILATERAL SALPINGOOPHORECTOMY    . WISDOM TOOTH EXTRACTION      There were no vitals filed for this visit.  Subjective Assessment - 02/11/18 1227    Subjective  My ankle has been good since last session.  No pain flare ups.    Patient Stated Goals  walk without boot, improve Lt ankle strength and A/ROM    Currently in Pain?  No/denies                       [facility] Adult PT Treatment/Exercise - 02/11/18 0001      Knee/Hip Exercises: Aerobic   Nustep  L2 x 8 min no arms.      Knee/Hip Exercises: Standing   Heel Raises  Both;2 sets;10 reps on mini tramp    Rocker Board  3 minutes    SLS  5x 10 seconds    Rebounder  weight shifting without boot 3 directions x 1 min each, tandem stance on mini tramp 3x20 seconds    Walking with Sports Cord  25# forward and reverse x 10, sidestepping 25# x 10 each with CGA by PT for safety      Ankle Exercises: Seated   BAPS  Level 2;Standing fwd/back and side to side; circles both ways      Ankle Exercises:  Standing   SLS  on green pod 3x20 seconds               PT Short Term Goals - 12/22/17 1159      PT SHORT TERM GOAL #2   Title  demonstrate symmetry with ambulation on level surface wearing CAM boot    Status  Achieved      PT SHORT TERM GOAL #3   Title  report < or = to 5/10 Lt ankle pain with standing and walking    Status  Achieved      PT SHORT TERM GOAL #4   Title  demonstrate active DF on the Lt to 5 degrees to allow for foot clearance when out of boot    Baseline  0    Time  4    Period  Weeks    Status  On-going    Target Date  01/12/18        PT Long Term Goals - 02/02/18 1235      PT LONG TERM GOAL #2   Title  reduce FOTO to < or = to 50% limitation      PT LONG TERM GOAL #3   Title  report < or = to 3/10 Lt ankle pain with standing and walking    Baseline  can be up to 7/10    Time  8    Period  Weeks    Status   On-going      PT LONG TERM GOAL #5   Title  improve LE strength to walk for 45 minutes without boot  in the community without limitation    Status  Achieved      PT LONG TERM GOAL #6   Title  improve Lt ankle strength and proprioception to perform single leg stance x 10 seconds on the Lt    Baseline  8 seconds    Time  8    Period  Weeks    Status  On-going            Plan - 02/11/18 1232    Clinical Impression Statement  Pt reports no pain increases over the past few days.  Edema fluctuates related to activity.  Pt with improved proprioception on the Lt LE and continues to work on this at home.  Pt continues to increase her activitiy with standing and walking in the community.  Pt requires assistance with standing Baps board today for stability.  Pt will continue to benefit from skilled PT for Lt ankle strength, stability and proprioception.      PT Frequency  2x / week    PT Duration  8 weeks    PT Treatment/Interventions  ADLs/Self Care Home Management;Cryotherapy;Electrical Stimulation;Gait training;Stair training;Functional mobility training;Therapeutic activities;Therapeutic exercise;Balance training;Patient/family education;Neuromuscular re-education;Manual techniques;Scar mobilization;Passive range of motion;Vasopneumatic Device;Taping    PT Next Visit Plan  Lt ankle proprioception, strength and edema management    Consulted and Agree with Plan of Care  Patient       Patient will benefit from skilled therapeutic intervention in order to improve the following deficits and impairments:  Abnormal gait, Pain, Increased fascial restricitons, Impaired flexibility, Increased edema, Decreased balance, Decreased activity tolerance, Decreased endurance, Decreased range of motion, Decreased strength, Decreased scar mobility  Visit Diagnosis: Other abnormalities of gait and mobility  Muscle weakness (generalized)  Stiffness of left ankle, not elsewhere classified  Pain in left  ankle and joints of left foot  Localized edema     Problem List  Patient Active Problem List   Diagnosis Date Noted  . Trimalleolar fracture of ankle, closed, left, initial encounter 09/24/2017  . Tibia/fibula fracture 09/23/2017  . Closed left ankle fracture 09/23/2017  . Ankle dislocation, left, initial encounter 09/23/2017  . Thyroid disease   . Sleep apnea   . Osteoporosis   . Type 2 diabetes mellitus ([place])   . Oth intartic fracture of lower end of right radius, init 07/29/2016  . Anal and rectal polyp 12/16/2013     Sigurd Sos, PT 02/11/18 1:02 PM  [facility] [address], [place], [place], [address] Phone: [phone]   Fax:  [phone]  Name: Destiny [name] MRN: [id number] Date of Birth: 17-Nov-1945

## 2018-02-16 ENCOUNTER — Ambulatory Visit: Payer: PPO

## 2018-02-16 DIAGNOSIS — M25672 Stiffness of left ankle, not elsewhere classified: Secondary | ICD-10-CM

## 2018-02-16 DIAGNOSIS — M6281 Muscle weakness (generalized): Secondary | ICD-10-CM

## 2018-02-16 DIAGNOSIS — R2689 Other abnormalities of gait and mobility: Secondary | ICD-10-CM | POA: Diagnosis not present

## 2018-02-16 NOTE — Therapy (Signed)
[facility] [address], [place], [place], [address] Phone: [phone]   Fax:  [phone]  Physical Therapy Treatment  Patient Details  Name: Destiny [name] MRN: [id number] Date of Birth: [birth year] Referring Provider: Katha Hamming, MD   Encounter Date: 02/16/2018  PT End of Session - 02/16/18 1303    Visit Number  25    Authorization Type  Medicare    PT Start Time  1225    PT Stop Time  1304    PT Time Calculation (min)  39 min    Activity Tolerance  Patient tolerated treatment well    Behavior During Therapy  [facility] for tasks assessed/performed       Past Medical History:  Diagnosis Date  . Allergy   . Ankle dislocation, left, initial encounter 09/23/2017  . Arthritis   . Blood transfusion    in 1980  . Diabetes mellitus   . Osteoporosis    osteopenia  . Sleep apnea    does not use cpap  . Thyroid disease    hypothyroidism    Past Surgical History:  Procedure Laterality Date  . COLONOSCOPY    . EXTERNAL FIXATION LEG Left 09/24/2017   Procedure: EXTERNAL FIXATION ankle;  Surgeon: Shona Needles, MD;  Location: [place];  Service: Orthopedics;  Laterality: Left;  . EXTERNAL FIXATION REMOVAL Left 09/28/2017   Procedure: REMOVAL EXTERNAL FIXATION LEG;  Surgeon: Shona Needles, MD;  Location: [place];  Service: Orthopedics;  Laterality: Left;  . LAPAROSCOPIC GASTRIC BANDING    . OPEN REDUCTION INTERNAL FIXATION (ORIF) DISTAL RADIAL FRACTURE Right 07/29/2016   Procedure: RIGHT OPEN REDUCTION INTERNAL FIXATION (ORIF) DISTAL RADIAL FRACTURE;  Surgeon: Leanora Cover, MD;  Location: [place];  Service: Orthopedics;  Laterality: Right;  . ORIF ANKLE FRACTURE Left 09/28/2017   Procedure: OPEN REDUCTION INTERNAL FIXATION (ORIF) ANKLE FRACTURE;  Surgeon: Shona Needles, MD;  Location: [place];  Service: Orthopedics;  Laterality: Left;  . POLYPECTOMY    . TOTAL ABDOMINAL HYSTERECTOMY W/ BILATERAL  SALPINGOOPHORECTOMY    . WISDOM TOOTH EXTRACTION      There were no vitals filed for this visit.  Subjective Assessment - 02/16/18 1241    Subjective  Ready for D/C to HEP    Pertinent History  Lt ankle ORIF: 09/28/17    Currently in Pain?  No/denies         [facility] PT Assessment - 02/16/18 0001      Assessment   Medical Diagnosis  Lt ankle fracture (s/p ORIF)      Cognition   Overall Cognitive Status  Within Functional Limits for tasks assessed      Observation/Other Assessments   Focus on Therapeutic Outcomes (FOTO)   25% limitation      Strength   Strength Assessment Site  Ankle    Right/Left Ankle  Left    Left Ankle Dorsiflexion  5/5    Left Ankle Inversion  4+/5    Left Ankle Eversion  4+/5      Ambulation/Gait   Pre-Gait Activities  single leg stance x 8 seconds                   [facility] Adult PT Treatment/Exercise - 02/16/18 0001      Knee/Hip Exercises: Aerobic   Nustep  L2 x 8 min no arms.      Knee/Hip Exercises: Standing   Heel Raises  Both;2 sets;10 reps on mini tramp  Rocker Board  3 minutes    Rebounder  weight shifting without boot 3 directions x 1 min each, tandem stance on mini tramp 3x20 seconds    Walking with Sports Cord  25# forward and reverse x 10, sidestepping 25# x 10 each with CGA by PT for safety      Ankle Exercises: Standing   SLS  on green pod 3x20 seconds               PT Short Term Goals - 12/22/17 1159      PT SHORT TERM GOAL #2   Title  demonstrate symmetry with ambulation on level surface wearing CAM boot    Status  Achieved      PT SHORT TERM GOAL #3   Title  report < or = to 5/10 Lt ankle pain with standing and walking    Status  Achieved      PT SHORT TERM GOAL #4   Title  demonstrate active DF on the Lt to 5 degrees to allow for foot clearance when out of boot    Baseline  0    Time  4    Period  Weeks    Status  On-going    Target Date  01/12/18        PT Long Term Goals - 02/16/18 1244       PT LONG TERM GOAL #1   Title  be independent in advanced HEP    Status  Achieved      PT LONG TERM GOAL #2   Title  reduce FOTO to < or = to 50% limitation    Baseline  25% limitation    Status  Achieved      PT LONG TERM GOAL #3   Title  report < or = to 3/10 Lt ankle pain with standing and walking    Baseline  no pain regularly    Status  Achieved      PT LONG TERM GOAL #4   Title  demonstrate > or = to 4+/5 Lt ankle strength to improve stability and safety with gait     Status  Achieved      PT LONG TERM GOAL #5   Title  improve LE strength to walk for 45 minutes without boot  in the community without limitation    Baseline  no limitations     Status  Achieved      PT LONG TERM GOAL #6   Title  improve Lt ankle strength and proprioception to perform single leg stance x 10 seconds on the Lt    Baseline  8 seconds    Status  Partially Met            Plan - 02/16/18 1300    Clinical Impression Statement  Pt will be discharged to HEP.  Pt has improved Lt ankle strength and endurance for standing and walking.  Pt reports that she doesn't have significant limitations in her endurance or strength at this time.  Pt will D/C to HEP at this time and follow-up with MD as needed.      PT Next Visit Plan  D/C PT to HEP    Consulted and Agree with Plan of Care  Patient       Patient will benefit from skilled therapeutic intervention in order to improve the following deficits and impairments:     Visit Diagnosis: Other abnormalities of gait and mobility  Muscle weakness (generalized)  Stiffness of left  ankle, not elsewhere classified     Problem List Patient Active Problem List   Diagnosis Date Noted  . Trimalleolar fracture of ankle, closed, left, initial encounter 09/24/2017  . Tibia/fibula fracture 09/23/2017  . Closed left ankle fracture 09/23/2017  . Ankle dislocation, left, initial encounter 09/23/2017  . Thyroid disease   . Sleep apnea   . Osteoporosis    . Type 2 diabetes mellitus ([place])   . Oth intartic fracture of lower end of right radius, init 07/29/2016  . Anal and rectal polyp 12/16/2013   PHYSICAL THERAPY DISCHARGE SUMMARY  Visits from Start of Care: 25  Current functional level related to goals / functional outcomes: See above for current status.     Remaining deficits: No significant deficits remain at this time.     Education / Equipment: HEP Plan: Patient agrees to discharge.  Patient goals were met. Patient is being discharged due to meeting the stated rehab goals.  ?????         Sigurd Sos, PT 02/16/18 1:07 PM  [facility] [address], [place], [place], [address] Phone: [phone]   Fax:  [phone]  Name: Destiny [name] MRN: [id number] Date of Birth: December 18, 1945

## 2018-03-23 DIAGNOSIS — S82852D Displaced trimalleolar fracture of left lower leg, subsequent encounter for closed fracture with routine healing: Secondary | ICD-10-CM | POA: Diagnosis not present

## 2018-03-24 DIAGNOSIS — G629 Polyneuropathy, unspecified: Secondary | ICD-10-CM | POA: Diagnosis not present

## 2018-03-24 DIAGNOSIS — E11610 Type 2 diabetes mellitus with diabetic neuropathic arthropathy: Secondary | ICD-10-CM | POA: Diagnosis not present

## 2018-03-24 DIAGNOSIS — Z6841 Body Mass Index (BMI) 40.0 and over, adult: Secondary | ICD-10-CM | POA: Diagnosis not present

## 2018-03-24 DIAGNOSIS — Z01419 Encounter for gynecological examination (general) (routine) without abnormal findings: Secondary | ICD-10-CM | POA: Diagnosis not present

## 2018-03-24 DIAGNOSIS — E039 Hypothyroidism, unspecified: Secondary | ICD-10-CM | POA: Diagnosis not present

## 2018-03-24 DIAGNOSIS — E785 Hyperlipidemia, unspecified: Secondary | ICD-10-CM | POA: Diagnosis not present

## 2018-03-24 DIAGNOSIS — Z1212 Encounter for screening for malignant neoplasm of rectum: Secondary | ICD-10-CM | POA: Diagnosis not present

## 2018-06-24 DIAGNOSIS — E11610 Type 2 diabetes mellitus with diabetic neuropathic arthropathy: Secondary | ICD-10-CM | POA: Diagnosis not present

## 2018-06-24 DIAGNOSIS — E785 Hyperlipidemia, unspecified: Secondary | ICD-10-CM | POA: Diagnosis not present

## 2018-07-06 DIAGNOSIS — E039 Hypothyroidism, unspecified: Secondary | ICD-10-CM | POA: Diagnosis not present

## 2018-07-06 DIAGNOSIS — Z23 Encounter for immunization: Secondary | ICD-10-CM | POA: Diagnosis not present

## 2018-07-06 DIAGNOSIS — G629 Polyneuropathy, unspecified: Secondary | ICD-10-CM | POA: Diagnosis not present

## 2018-07-06 DIAGNOSIS — Z6841 Body Mass Index (BMI) 40.0 and over, adult: Secondary | ICD-10-CM | POA: Diagnosis not present

## 2018-07-06 DIAGNOSIS — E785 Hyperlipidemia, unspecified: Secondary | ICD-10-CM | POA: Diagnosis not present

## 2018-07-06 DIAGNOSIS — E11610 Type 2 diabetes mellitus with diabetic neuropathic arthropathy: Secondary | ICD-10-CM | POA: Diagnosis not present

## 2018-07-06 DIAGNOSIS — C44612 Basal cell carcinoma of skin of right upper limb, including shoulder: Secondary | ICD-10-CM | POA: Diagnosis not present

## 2018-07-07 DIAGNOSIS — C44612 Basal cell carcinoma of skin of right upper limb, including shoulder: Secondary | ICD-10-CM | POA: Diagnosis not present

## 2018-07-22 DIAGNOSIS — Z85828 Personal history of other malignant neoplasm of skin: Secondary | ICD-10-CM | POA: Diagnosis not present

## 2018-07-22 DIAGNOSIS — C44612 Basal cell carcinoma of skin of right upper limb, including shoulder: Secondary | ICD-10-CM | POA: Diagnosis not present

## 2018-08-09 DIAGNOSIS — C44612 Basal cell carcinoma of skin of right upper limb, including shoulder: Secondary | ICD-10-CM | POA: Diagnosis not present

## 2018-08-09 DIAGNOSIS — Z85828 Personal history of other malignant neoplasm of skin: Secondary | ICD-10-CM | POA: Diagnosis not present

## 2018-12-23 DIAGNOSIS — H02834 Dermatochalasis of left upper eyelid: Secondary | ICD-10-CM | POA: Diagnosis not present

## 2018-12-23 DIAGNOSIS — Z961 Presence of intraocular lens: Secondary | ICD-10-CM | POA: Diagnosis not present

## 2018-12-23 DIAGNOSIS — H353132 Nonexudative age-related macular degeneration, bilateral, intermediate dry stage: Secondary | ICD-10-CM | POA: Diagnosis not present

## 2018-12-23 DIAGNOSIS — E119 Type 2 diabetes mellitus without complications: Secondary | ICD-10-CM | POA: Diagnosis not present

## 2018-12-23 DIAGNOSIS — H16213 Exposure keratoconjunctivitis, bilateral: Secondary | ICD-10-CM | POA: Diagnosis not present

## 2018-12-23 DIAGNOSIS — H04123 Dry eye syndrome of bilateral lacrimal glands: Secondary | ICD-10-CM | POA: Diagnosis not present

## 2018-12-23 DIAGNOSIS — H02831 Dermatochalasis of right upper eyelid: Secondary | ICD-10-CM | POA: Diagnosis not present

## 2018-12-24 DIAGNOSIS — I10 Essential (primary) hypertension: Secondary | ICD-10-CM | POA: Diagnosis not present

## 2018-12-24 DIAGNOSIS — E039 Hypothyroidism, unspecified: Secondary | ICD-10-CM | POA: Diagnosis not present

## 2018-12-24 DIAGNOSIS — E11610 Type 2 diabetes mellitus with diabetic neuropathic arthropathy: Secondary | ICD-10-CM | POA: Diagnosis not present

## 2018-12-24 DIAGNOSIS — E785 Hyperlipidemia, unspecified: Secondary | ICD-10-CM | POA: Diagnosis not present

## 2018-12-24 DIAGNOSIS — M810 Age-related osteoporosis without current pathological fracture: Secondary | ICD-10-CM | POA: Diagnosis not present

## 2018-12-28 DIAGNOSIS — I10 Essential (primary) hypertension: Secondary | ICD-10-CM | POA: Diagnosis not present

## 2018-12-28 DIAGNOSIS — E1140 Type 2 diabetes mellitus with diabetic neuropathy, unspecified: Secondary | ICD-10-CM | POA: Diagnosis not present

## 2018-12-28 DIAGNOSIS — Z0001 Encounter for general adult medical examination with abnormal findings: Secondary | ICD-10-CM | POA: Diagnosis not present

## 2018-12-28 DIAGNOSIS — E785 Hyperlipidemia, unspecified: Secondary | ICD-10-CM | POA: Diagnosis not present

## 2018-12-28 DIAGNOSIS — E11610 Type 2 diabetes mellitus with diabetic neuropathic arthropathy: Secondary | ICD-10-CM | POA: Diagnosis not present

## 2018-12-28 DIAGNOSIS — I6529 Occlusion and stenosis of unspecified carotid artery: Secondary | ICD-10-CM | POA: Diagnosis not present

## 2018-12-28 DIAGNOSIS — G4733 Obstructive sleep apnea (adult) (pediatric): Secondary | ICD-10-CM | POA: Diagnosis not present

## 2018-12-28 DIAGNOSIS — E039 Hypothyroidism, unspecified: Secondary | ICD-10-CM | POA: Diagnosis not present

## 2018-12-28 DIAGNOSIS — J309 Allergic rhinitis, unspecified: Secondary | ICD-10-CM | POA: Diagnosis not present

## 2019-05-03 DIAGNOSIS — M9905 Segmental and somatic dysfunction of pelvic region: Secondary | ICD-10-CM | POA: Diagnosis not present

## 2019-05-03 DIAGNOSIS — M5116 Intervertebral disc disorders with radiculopathy, lumbar region: Secondary | ICD-10-CM | POA: Diagnosis not present

## 2019-05-03 DIAGNOSIS — M9914 Subluxation complex (vertebral) of sacral region: Secondary | ICD-10-CM | POA: Diagnosis not present

## 2019-05-03 DIAGNOSIS — M9903 Segmental and somatic dysfunction of lumbar region: Secondary | ICD-10-CM | POA: Diagnosis not present

## 2019-05-04 DIAGNOSIS — M9905 Segmental and somatic dysfunction of pelvic region: Secondary | ICD-10-CM | POA: Diagnosis not present

## 2019-05-04 DIAGNOSIS — M9903 Segmental and somatic dysfunction of lumbar region: Secondary | ICD-10-CM | POA: Diagnosis not present

## 2019-05-04 DIAGNOSIS — M9914 Subluxation complex (vertebral) of sacral region: Secondary | ICD-10-CM | POA: Diagnosis not present

## 2019-05-04 DIAGNOSIS — M5116 Intervertebral disc disorders with radiculopathy, lumbar region: Secondary | ICD-10-CM | POA: Diagnosis not present

## 2019-05-05 DIAGNOSIS — M5116 Intervertebral disc disorders with radiculopathy, lumbar region: Secondary | ICD-10-CM | POA: Diagnosis not present

## 2019-05-05 DIAGNOSIS — M9903 Segmental and somatic dysfunction of lumbar region: Secondary | ICD-10-CM | POA: Diagnosis not present

## 2019-05-05 DIAGNOSIS — M9905 Segmental and somatic dysfunction of pelvic region: Secondary | ICD-10-CM | POA: Diagnosis not present

## 2019-05-05 DIAGNOSIS — M9914 Subluxation complex (vertebral) of sacral region: Secondary | ICD-10-CM | POA: Diagnosis not present

## 2019-05-10 DIAGNOSIS — M9903 Segmental and somatic dysfunction of lumbar region: Secondary | ICD-10-CM | POA: Diagnosis not present

## 2019-05-10 DIAGNOSIS — M9914 Subluxation complex (vertebral) of sacral region: Secondary | ICD-10-CM | POA: Diagnosis not present

## 2019-05-10 DIAGNOSIS — M5116 Intervertebral disc disorders with radiculopathy, lumbar region: Secondary | ICD-10-CM | POA: Diagnosis not present

## 2019-05-10 DIAGNOSIS — M9905 Segmental and somatic dysfunction of pelvic region: Secondary | ICD-10-CM | POA: Diagnosis not present

## 2019-05-11 DIAGNOSIS — M5116 Intervertebral disc disorders with radiculopathy, lumbar region: Secondary | ICD-10-CM | POA: Diagnosis not present

## 2019-05-11 DIAGNOSIS — M9903 Segmental and somatic dysfunction of lumbar region: Secondary | ICD-10-CM | POA: Diagnosis not present

## 2019-05-11 DIAGNOSIS — M9914 Subluxation complex (vertebral) of sacral region: Secondary | ICD-10-CM | POA: Diagnosis not present

## 2019-05-11 DIAGNOSIS — M9905 Segmental and somatic dysfunction of pelvic region: Secondary | ICD-10-CM | POA: Diagnosis not present

## 2019-05-12 DIAGNOSIS — M9905 Segmental and somatic dysfunction of pelvic region: Secondary | ICD-10-CM | POA: Diagnosis not present

## 2019-05-12 DIAGNOSIS — M9914 Subluxation complex (vertebral) of sacral region: Secondary | ICD-10-CM | POA: Diagnosis not present

## 2019-05-12 DIAGNOSIS — M9903 Segmental and somatic dysfunction of lumbar region: Secondary | ICD-10-CM | POA: Diagnosis not present

## 2019-05-12 DIAGNOSIS — M5116 Intervertebral disc disorders with radiculopathy, lumbar region: Secondary | ICD-10-CM | POA: Diagnosis not present

## 2019-05-13 ENCOUNTER — Other Ambulatory Visit: Payer: Self-pay

## 2019-05-17 DIAGNOSIS — M9905 Segmental and somatic dysfunction of pelvic region: Secondary | ICD-10-CM | POA: Diagnosis not present

## 2019-05-17 DIAGNOSIS — M9914 Subluxation complex (vertebral) of sacral region: Secondary | ICD-10-CM | POA: Diagnosis not present

## 2019-05-17 DIAGNOSIS — M9903 Segmental and somatic dysfunction of lumbar region: Secondary | ICD-10-CM | POA: Diagnosis not present

## 2019-05-17 DIAGNOSIS — M5116 Intervertebral disc disorders with radiculopathy, lumbar region: Secondary | ICD-10-CM | POA: Diagnosis not present

## 2019-05-18 DIAGNOSIS — M9914 Subluxation complex (vertebral) of sacral region: Secondary | ICD-10-CM | POA: Diagnosis not present

## 2019-05-18 DIAGNOSIS — M9905 Segmental and somatic dysfunction of pelvic region: Secondary | ICD-10-CM | POA: Diagnosis not present

## 2019-05-18 DIAGNOSIS — M9903 Segmental and somatic dysfunction of lumbar region: Secondary | ICD-10-CM | POA: Diagnosis not present

## 2019-05-18 DIAGNOSIS — M5116 Intervertebral disc disorders with radiculopathy, lumbar region: Secondary | ICD-10-CM | POA: Diagnosis not present

## 2019-05-19 DIAGNOSIS — M9914 Subluxation complex (vertebral) of sacral region: Secondary | ICD-10-CM | POA: Diagnosis not present

## 2019-05-19 DIAGNOSIS — M9903 Segmental and somatic dysfunction of lumbar region: Secondary | ICD-10-CM | POA: Diagnosis not present

## 2019-05-19 DIAGNOSIS — M5116 Intervertebral disc disorders with radiculopathy, lumbar region: Secondary | ICD-10-CM | POA: Diagnosis not present

## 2019-05-19 DIAGNOSIS — M9905 Segmental and somatic dysfunction of pelvic region: Secondary | ICD-10-CM | POA: Diagnosis not present

## 2019-05-24 DIAGNOSIS — M5116 Intervertebral disc disorders with radiculopathy, lumbar region: Secondary | ICD-10-CM | POA: Diagnosis not present

## 2019-05-24 DIAGNOSIS — M9903 Segmental and somatic dysfunction of lumbar region: Secondary | ICD-10-CM | POA: Diagnosis not present

## 2019-05-24 DIAGNOSIS — M9914 Subluxation complex (vertebral) of sacral region: Secondary | ICD-10-CM | POA: Diagnosis not present

## 2019-05-24 DIAGNOSIS — M9905 Segmental and somatic dysfunction of pelvic region: Secondary | ICD-10-CM | POA: Diagnosis not present

## 2019-05-25 DIAGNOSIS — M9914 Subluxation complex (vertebral) of sacral region: Secondary | ICD-10-CM | POA: Diagnosis not present

## 2019-05-25 DIAGNOSIS — M9905 Segmental and somatic dysfunction of pelvic region: Secondary | ICD-10-CM | POA: Diagnosis not present

## 2019-05-25 DIAGNOSIS — M9903 Segmental and somatic dysfunction of lumbar region: Secondary | ICD-10-CM | POA: Diagnosis not present

## 2019-05-25 DIAGNOSIS — M5116 Intervertebral disc disorders with radiculopathy, lumbar region: Secondary | ICD-10-CM | POA: Diagnosis not present

## 2019-05-26 DIAGNOSIS — M9903 Segmental and somatic dysfunction of lumbar region: Secondary | ICD-10-CM | POA: Diagnosis not present

## 2019-05-26 DIAGNOSIS — M5116 Intervertebral disc disorders with radiculopathy, lumbar region: Secondary | ICD-10-CM | POA: Diagnosis not present

## 2019-05-26 DIAGNOSIS — M9905 Segmental and somatic dysfunction of pelvic region: Secondary | ICD-10-CM | POA: Diagnosis not present

## 2019-05-26 DIAGNOSIS — M9914 Subluxation complex (vertebral) of sacral region: Secondary | ICD-10-CM | POA: Diagnosis not present

## 2019-05-31 DIAGNOSIS — M9903 Segmental and somatic dysfunction of lumbar region: Secondary | ICD-10-CM | POA: Diagnosis not present

## 2019-05-31 DIAGNOSIS — M5116 Intervertebral disc disorders with radiculopathy, lumbar region: Secondary | ICD-10-CM | POA: Diagnosis not present

## 2019-05-31 DIAGNOSIS — M9914 Subluxation complex (vertebral) of sacral region: Secondary | ICD-10-CM | POA: Diagnosis not present

## 2019-05-31 DIAGNOSIS — M9905 Segmental and somatic dysfunction of pelvic region: Secondary | ICD-10-CM | POA: Diagnosis not present

## 2019-06-02 DIAGNOSIS — M9905 Segmental and somatic dysfunction of pelvic region: Secondary | ICD-10-CM | POA: Diagnosis not present

## 2019-06-02 DIAGNOSIS — M9903 Segmental and somatic dysfunction of lumbar region: Secondary | ICD-10-CM | POA: Diagnosis not present

## 2019-06-02 DIAGNOSIS — M5116 Intervertebral disc disorders with radiculopathy, lumbar region: Secondary | ICD-10-CM | POA: Diagnosis not present

## 2019-06-02 DIAGNOSIS — M9914 Subluxation complex (vertebral) of sacral region: Secondary | ICD-10-CM | POA: Diagnosis not present

## 2019-06-07 DIAGNOSIS — M9905 Segmental and somatic dysfunction of pelvic region: Secondary | ICD-10-CM | POA: Diagnosis not present

## 2019-06-07 DIAGNOSIS — M5116 Intervertebral disc disorders with radiculopathy, lumbar region: Secondary | ICD-10-CM | POA: Diagnosis not present

## 2019-06-07 DIAGNOSIS — M9903 Segmental and somatic dysfunction of lumbar region: Secondary | ICD-10-CM | POA: Diagnosis not present

## 2019-06-07 DIAGNOSIS — M9914 Subluxation complex (vertebral) of sacral region: Secondary | ICD-10-CM | POA: Diagnosis not present

## 2019-06-14 DIAGNOSIS — M5116 Intervertebral disc disorders with radiculopathy, lumbar region: Secondary | ICD-10-CM | POA: Diagnosis not present

## 2019-06-14 DIAGNOSIS — M9905 Segmental and somatic dysfunction of pelvic region: Secondary | ICD-10-CM | POA: Diagnosis not present

## 2019-06-14 DIAGNOSIS — M9903 Segmental and somatic dysfunction of lumbar region: Secondary | ICD-10-CM | POA: Diagnosis not present

## 2019-06-14 DIAGNOSIS — M9914 Subluxation complex (vertebral) of sacral region: Secondary | ICD-10-CM | POA: Diagnosis not present

## 2019-06-21 DIAGNOSIS — M9905 Segmental and somatic dysfunction of pelvic region: Secondary | ICD-10-CM | POA: Diagnosis not present

## 2019-06-21 DIAGNOSIS — M9914 Subluxation complex (vertebral) of sacral region: Secondary | ICD-10-CM | POA: Diagnosis not present

## 2019-06-21 DIAGNOSIS — M9903 Segmental and somatic dysfunction of lumbar region: Secondary | ICD-10-CM | POA: Diagnosis not present

## 2019-06-21 DIAGNOSIS — M5116 Intervertebral disc disorders with radiculopathy, lumbar region: Secondary | ICD-10-CM | POA: Diagnosis not present

## 2019-08-24 DIAGNOSIS — Z23 Encounter for immunization: Secondary | ICD-10-CM | POA: Diagnosis not present

## 2019-08-30 DIAGNOSIS — Z20828 Contact with and (suspected) exposure to other viral communicable diseases: Secondary | ICD-10-CM | POA: Diagnosis not present

## 2019-08-30 DIAGNOSIS — U071 COVID-19: Secondary | ICD-10-CM | POA: Diagnosis not present

## 2019-08-30 DIAGNOSIS — Z7189 Other specified counseling: Secondary | ICD-10-CM | POA: Diagnosis not present

## 2019-10-20 DIAGNOSIS — D1801 Hemangioma of skin and subcutaneous tissue: Secondary | ICD-10-CM | POA: Diagnosis not present

## 2019-10-20 DIAGNOSIS — D485 Neoplasm of uncertain behavior of skin: Secondary | ICD-10-CM | POA: Diagnosis not present

## 2019-10-20 DIAGNOSIS — L570 Actinic keratosis: Secondary | ICD-10-CM | POA: Diagnosis not present

## 2019-10-20 DIAGNOSIS — L820 Inflamed seborrheic keratosis: Secondary | ICD-10-CM | POA: Diagnosis not present

## 2019-10-20 DIAGNOSIS — L812 Freckles: Secondary | ICD-10-CM | POA: Diagnosis not present

## 2019-10-20 DIAGNOSIS — Z85828 Personal history of other malignant neoplasm of skin: Secondary | ICD-10-CM | POA: Diagnosis not present

## 2019-10-20 DIAGNOSIS — D0462 Carcinoma in situ of skin of left upper limb, including shoulder: Secondary | ICD-10-CM | POA: Diagnosis not present

## 2019-10-20 DIAGNOSIS — L821 Other seborrheic keratosis: Secondary | ICD-10-CM | POA: Diagnosis not present

## 2019-11-11 ENCOUNTER — Ambulatory Visit: Payer: PPO

## 2019-11-19 ENCOUNTER — Ambulatory Visit: Payer: PPO | Attending: Internal Medicine

## 2019-11-19 DIAGNOSIS — Z23 Encounter for immunization: Secondary | ICD-10-CM | POA: Insufficient documentation

## 2019-11-19 NOTE — Progress Notes (Signed)
   Covid-19 Vaccination Clinic  Name:  Destiny [name]    MRN: [id number] DOB: [birth year]  11/19/2019  [name] was observed post Covid-19 immunization for 30 minutes based on pre-vaccination screening without incidence. She was provided with Vaccine Information Sheet and instruction to access the V-Safe system.   [name] was instructed to call 911 with any severe reactions post vaccine: Marland Kitchen Difficulty breathing  . Swelling of your face and throat  . A fast heartbeat  . A bad rash all over your body  . Dizziness and weakness    Immunizations Administered    Name Date Dose VIS Date Route   Pfizer COVID-19 Vaccine 11/19/2019  3:04 PM 0.3 mL 09/23/2019 Intramuscular   Manufacturer: [place]   Lot: [id number]   [place]: [id number]

## 2019-12-14 ENCOUNTER — Ambulatory Visit: Payer: PPO | Attending: Internal Medicine

## 2019-12-14 DIAGNOSIS — Z23 Encounter for immunization: Secondary | ICD-10-CM

## 2019-12-14 NOTE — Progress Notes (Signed)
   Covid-19 Vaccination Clinic  Name:  Destiny [name]    MRN: [id number] DOB: 11-24-1945  12/14/2019  Destiny [name] was observed post Covid-19 immunization for 30 minutes based on pre-vaccination screening without incident. She was provided with Vaccine Information Sheet and instruction to access the V-Safe system.   Destiny [name] was instructed to call 911 with any severe reactions post vaccine: Marland Kitchen Difficulty breathing  . Swelling of face and throat  . A fast heartbeat  . A bad rash all over body  . Dizziness and weakness   Immunizations Administered    Name Date Dose VIS Date Route   Pfizer COVID-19 Vaccine 12/14/2019 10:39 AM 0.3 mL 09/23/2019 Intramuscular   Manufacturer: [place]   Lot: [id number]   [place]: [id number]

## 2019-12-22 DIAGNOSIS — H02831 Dermatochalasis of right upper eyelid: Secondary | ICD-10-CM | POA: Diagnosis not present

## 2019-12-22 DIAGNOSIS — H353132 Nonexudative age-related macular degeneration, bilateral, intermediate dry stage: Secondary | ICD-10-CM | POA: Diagnosis not present

## 2019-12-22 DIAGNOSIS — H02834 Dermatochalasis of left upper eyelid: Secondary | ICD-10-CM | POA: Diagnosis not present

## 2019-12-22 DIAGNOSIS — H04123 Dry eye syndrome of bilateral lacrimal glands: Secondary | ICD-10-CM | POA: Diagnosis not present

## 2019-12-22 DIAGNOSIS — H16213 Exposure keratoconjunctivitis, bilateral: Secondary | ICD-10-CM | POA: Diagnosis not present

## 2019-12-22 DIAGNOSIS — E119 Type 2 diabetes mellitus without complications: Secondary | ICD-10-CM | POA: Diagnosis not present

## 2019-12-22 DIAGNOSIS — Z961 Presence of intraocular lens: Secondary | ICD-10-CM | POA: Diagnosis not present

## 2019-12-29 DIAGNOSIS — I10 Essential (primary) hypertension: Secondary | ICD-10-CM | POA: Diagnosis not present

## 2019-12-29 DIAGNOSIS — E785 Hyperlipidemia, unspecified: Secondary | ICD-10-CM | POA: Diagnosis not present

## 2019-12-29 DIAGNOSIS — E11610 Type 2 diabetes mellitus with diabetic neuropathic arthropathy: Secondary | ICD-10-CM | POA: Diagnosis not present

## 2019-12-29 DIAGNOSIS — E039 Hypothyroidism, unspecified: Secondary | ICD-10-CM | POA: Diagnosis not present

## 2020-01-03 DIAGNOSIS — G4733 Obstructive sleep apnea (adult) (pediatric): Secondary | ICD-10-CM | POA: Diagnosis not present

## 2020-01-03 DIAGNOSIS — R221 Localized swelling, mass and lump, neck: Secondary | ICD-10-CM | POA: Diagnosis not present

## 2020-01-03 DIAGNOSIS — Z0000 Encounter for general adult medical examination without abnormal findings: Secondary | ICD-10-CM | POA: Diagnosis not present

## 2020-01-03 DIAGNOSIS — M810 Age-related osteoporosis without current pathological fracture: Secondary | ICD-10-CM | POA: Diagnosis not present

## 2020-01-03 DIAGNOSIS — E039 Hypothyroidism, unspecified: Secondary | ICD-10-CM | POA: Diagnosis not present

## 2020-01-03 DIAGNOSIS — E11610 Type 2 diabetes mellitus with diabetic neuropathic arthropathy: Secondary | ICD-10-CM | POA: Diagnosis not present

## 2020-01-03 DIAGNOSIS — E042 Nontoxic multinodular goiter: Secondary | ICD-10-CM | POA: Diagnosis not present

## 2020-01-03 DIAGNOSIS — Z86010 Personal history of colonic polyps: Secondary | ICD-10-CM | POA: Diagnosis not present

## 2020-01-03 DIAGNOSIS — I6529 Occlusion and stenosis of unspecified carotid artery: Secondary | ICD-10-CM | POA: Diagnosis not present

## 2020-01-03 DIAGNOSIS — J309 Allergic rhinitis, unspecified: Secondary | ICD-10-CM | POA: Diagnosis not present

## 2020-01-03 DIAGNOSIS — E785 Hyperlipidemia, unspecified: Secondary | ICD-10-CM | POA: Diagnosis not present

## 2020-01-12 DIAGNOSIS — E7800 Pure hypercholesterolemia, unspecified: Secondary | ICD-10-CM | POA: Diagnosis not present

## 2020-01-12 DIAGNOSIS — E039 Hypothyroidism, unspecified: Secondary | ICD-10-CM | POA: Diagnosis not present

## 2020-01-12 DIAGNOSIS — E11610 Type 2 diabetes mellitus with diabetic neuropathic arthropathy: Secondary | ICD-10-CM | POA: Diagnosis not present

## 2020-01-12 DIAGNOSIS — M810 Age-related osteoporosis without current pathological fracture: Secondary | ICD-10-CM | POA: Diagnosis not present

## 2020-01-12 DIAGNOSIS — E1121 Type 2 diabetes mellitus with diabetic nephropathy: Secondary | ICD-10-CM | POA: Diagnosis not present

## 2020-01-19 DIAGNOSIS — I6529 Occlusion and stenosis of unspecified carotid artery: Secondary | ICD-10-CM | POA: Diagnosis not present

## 2020-01-19 DIAGNOSIS — I6523 Occlusion and stenosis of bilateral carotid arteries: Secondary | ICD-10-CM | POA: Diagnosis not present

## 2020-02-07 DIAGNOSIS — R221 Localized swelling, mass and lump, neck: Secondary | ICD-10-CM | POA: Diagnosis not present

## 2020-02-08 ENCOUNTER — Other Ambulatory Visit: Payer: Self-pay | Admitting: Otolaryngology

## 2020-02-08 DIAGNOSIS — R221 Localized swelling, mass and lump, neck: Secondary | ICD-10-CM

## 2020-02-14 ENCOUNTER — Encounter: Payer: Self-pay | Admitting: Internal Medicine

## 2020-02-21 DIAGNOSIS — E11621 Type 2 diabetes mellitus with foot ulcer: Secondary | ICD-10-CM | POA: Diagnosis not present

## 2020-02-21 DIAGNOSIS — L97421 Non-pressure chronic ulcer of left heel and midfoot limited to breakdown of skin: Secondary | ICD-10-CM | POA: Diagnosis not present

## 2020-02-23 ENCOUNTER — Ambulatory Visit
Admission: RE | Admit: 2020-02-23 | Discharge: 2020-02-23 | Disposition: A | Discharge status: Home / Self Care | Payer: PPO | Source: Ambulatory Visit | Attending: Otolaryngology | Admitting: Otolaryngology

## 2020-02-23 ENCOUNTER — Other Ambulatory Visit: Payer: Self-pay

## 2020-02-23 DIAGNOSIS — R221 Localized swelling, mass and lump, neck: Secondary | ICD-10-CM

## 2020-02-23 IMAGING — CT CT NECK W/ CM
2 of 4 series · 5 of 14 positions shown, 6 images · IV contrast (iopamidol)
Comparison: [DATE]

CLINICAL DATA: Right lateral neck mass

EXAM:
CT NECK WITH CONTRAST
TECHNIQUE: Multidetector CT imaging of the neck was performed using the
standard protocol following the bolus administration of intravenous
contrast.
CONTRAST:  75mL [G7] IOPAMIDOL ([G7]) INJECTION 61%

[Series 3: neck · axial · 0.51mm/px · z∈[-226,-156]mm · 2 of 107 slices shown]
[im 36/107  bone]
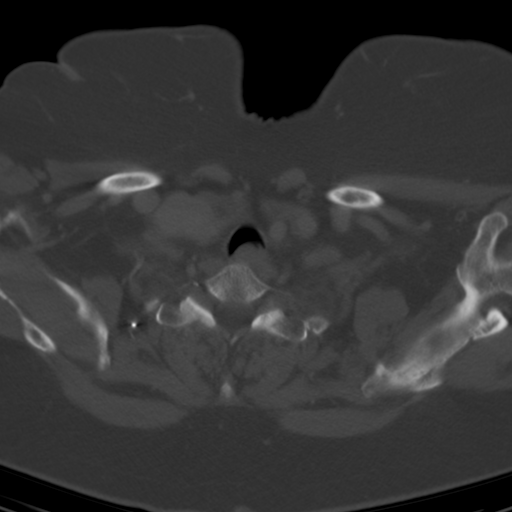
[im 71/107  bone]
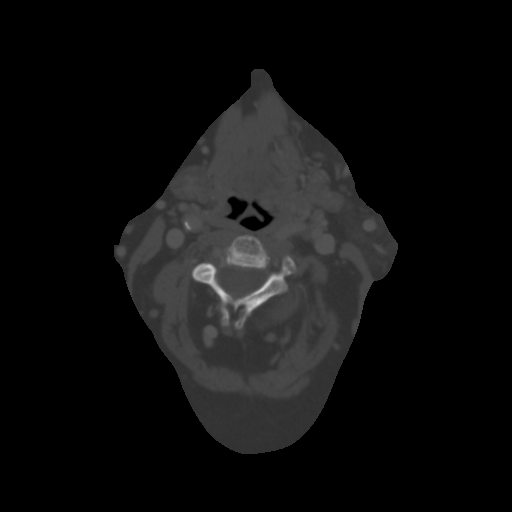

[Series 8: angled axial-oropharynx · axial · 0.52mm/px · z∈[-280,-170]mm · 3 of 115 slices shown, 4 images]
[im 29/115  soft-tissue]
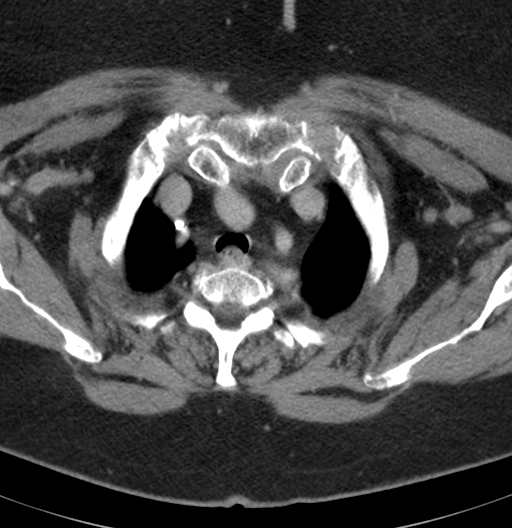
[im 29/115  bone]
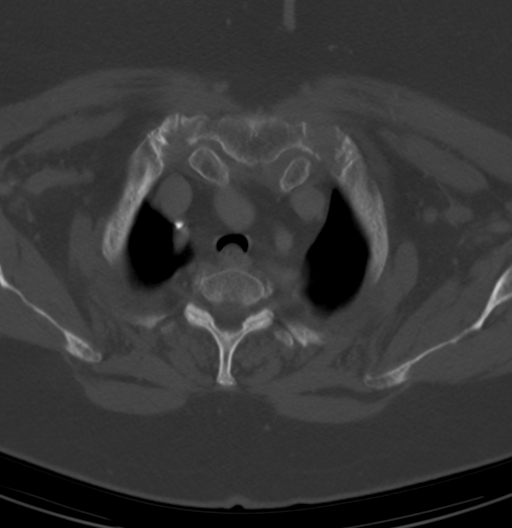
[im 58/115  bone]
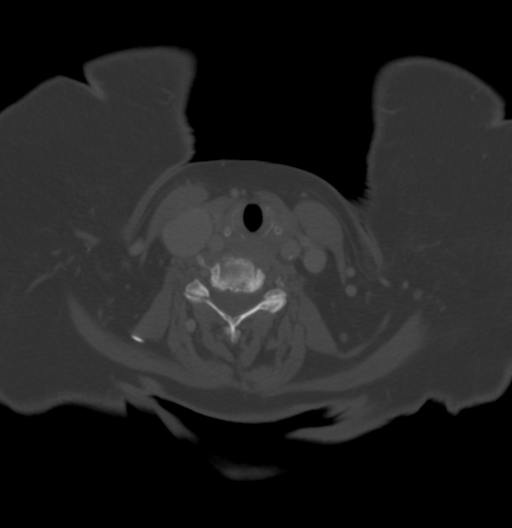
[im 86/115  bone]
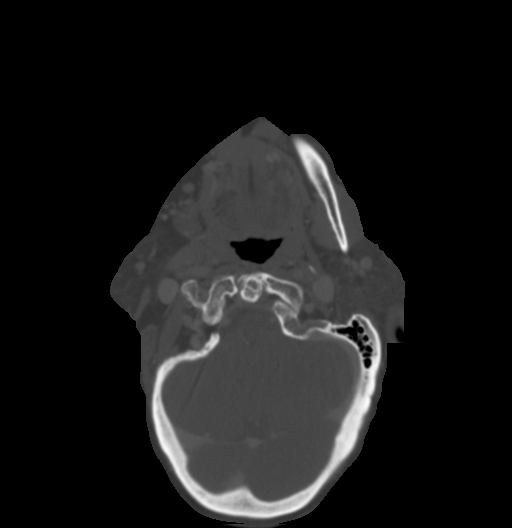

[5 of 14 positions shown; findings below may reference images not displayed]

FINDINGS: Pharynx and larynx: No evidence of mass or swelling.

Salivary glands: No inflammation, mass, or stone. Equivocal greater
fatty infiltration of the right parotid.

Thyroid: Normal.

Lymph nodes: None enlarged or abnormal density, including at the
level of palpable marker over the lateral right neck.

Vascular: Atheromatous plaque.

Limited intracranial: Negative

Visualized orbits: Not covered

Mastoids and visualized paranasal sinuses: Clear

Skeleton: Ordinary degenerative changes in the cervical spine.

Upper chest: Negative
IMPRESSION: No adenopathy or mass to correlate with palpable complaint.

## 2020-02-23 MED ORDER — IOPAMIDOL (ISOVUE-300) INJECTION 61%
75.0000 mL | Freq: Once | INTRAVENOUS | Status: AC | PRN
  Administered 2020-02-23: 75 mL via INTRAVENOUS

## 2020-02-24 DIAGNOSIS — L97421 Non-pressure chronic ulcer of left heel and midfoot limited to breakdown of skin: Secondary | ICD-10-CM | POA: Diagnosis not present

## 2020-02-24 DIAGNOSIS — E11621 Type 2 diabetes mellitus with foot ulcer: Secondary | ICD-10-CM | POA: Diagnosis not present

## 2020-02-29 ENCOUNTER — Ambulatory Visit: Payer: PPO | Admitting: Podiatrist

## 2020-02-29 ENCOUNTER — Other Ambulatory Visit: Payer: Self-pay

## 2020-02-29 ENCOUNTER — Ambulatory Visit (INDEPENDENT_AMBULATORY_CARE_PROVIDER_SITE_OTHER): Payer: PPO

## 2020-02-29 ENCOUNTER — Other Ambulatory Visit: Payer: Self-pay | Admitting: Podiatrist

## 2020-02-29 DIAGNOSIS — L97421 Non-pressure chronic ulcer of left heel and midfoot limited to breakdown of skin: Secondary | ICD-10-CM

## 2020-02-29 DIAGNOSIS — M79671 Pain in right foot: Secondary | ICD-10-CM

## 2020-02-29 NOTE — Progress Notes (Signed)
  Chief Complaint  Patient presents with  . Foot Pain    pt is here for left foot pain possible wound to the bottom of the left heel, going on for 2 weeks     HPI: Patient is 74 y.o. female who presents today for the concerns as listed above. She has had a heel wound for approximately 2 weeks.  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  She noticed blood and saw her primary care doctor and was started on clindamycin and has been applying vaseline to the wound with improvement in the appearance of the wound.     Review of Systems No fevers, chills, nausea, muscle aches, no difficulty breathing, no calf pain, no chest pain or shortness of breath.   Physical Exam  GENERAL APPEARANCE: Alert, conversant. Appropriately groomed. No acute distress.   VASCULAR: Pedal pulses palpable DP and PT bilateral.  Capillary refill time is immediate to all digits,  Proximal to distal cooling it warm to warm.  Digital hair growth is present bilateral   NEUROLOGIC: sensation is decreased with peripheral neuropathy noted. Protective sensation decreased to 5.07 monofilament at 2/5 sites bilateral.  Light touch is decreased bilateral, vibratory sensation decreased bilateral,  MUSCULOSKELETAL: acceptable muscle strength, tone and stability bilateral.  No gross boney pedal deformities noted.  No pain, crepitus or limitation noted with foot and ankle range of motion bilateral.   DERMATOLOGIC: skin is warm, supple, and dry.   7x39mm ulceration posterior left heel with a beefy red granular base and intact margins with no undermining or tunneling.  The lesion has a v notch and appears to be from a skin tear.     No periwound redness or streaking, no pus or pirulence expressed from the wound.    Radiographic exam:  Normal osseous mineralization.  No fracture or dislocation or acute osseous abnormalities present.  Joint spaces are normal.  No emphysema within the soft tissues, no boney changes to the posterior heel left.     Assessment     ICD-10-CM   1. Foot pain, right  M79.671 CANCELED: DG Foot Complete Right  2. Lower limb ulcer, heel or midfoot, left, limited to breakdown of skin ([place])  L97.421      Plan  Discussed ulcerations and the importance of keeping weight off the area and wound care instructions were given.  I cleansed the ulcer and applied iodosorb and a dsd.  The patient was instructed for continued use of vaseline and a dsd at home as the wound appears to be improving with this regimine. She will return in a week for follow up of the ulcer and will call if any signs or symptoms of infection are to arise.

## 2020-02-29 NOTE — Patient Instructions (Signed)
Instructions for Wound Care  The most important step to healing a foot wound is to reduce the pressure on your foot - it is extremely important to stay off your foot as much as possible and wear the shoe/boot as instructed.  Cleanse your foot with saline wash or warm soapy water (dial antibacterial soap or similar).  Blot dry.  Apply vaseline to youur wound and cover with a bandaid or gauze and a bandage.    If you notice any foul odor, increase in pain, pus, increased swelling, red streaks or generalized redness occurring in your foot or leg-Call our office immediately to be seen.  This may be a sign of a limb or life threatening infection that will need prompt attention.  [name], [place]  [phone] [facility]

## 2020-03-06 ENCOUNTER — Encounter: Payer: Self-pay | Admitting: Podiatrist

## 2020-03-09 DIAGNOSIS — E039 Hypothyroidism, unspecified: Secondary | ICD-10-CM | POA: Diagnosis not present

## 2020-03-15 DIAGNOSIS — E1121 Type 2 diabetes mellitus with diabetic nephropathy: Secondary | ICD-10-CM | POA: Diagnosis not present

## 2020-03-15 DIAGNOSIS — E11610 Type 2 diabetes mellitus with diabetic neuropathic arthropathy: Secondary | ICD-10-CM | POA: Diagnosis not present

## 2020-03-15 DIAGNOSIS — E039 Hypothyroidism, unspecified: Secondary | ICD-10-CM | POA: Diagnosis not present

## 2020-03-15 DIAGNOSIS — M810 Age-related osteoporosis without current pathological fracture: Secondary | ICD-10-CM | POA: Diagnosis not present

## 2020-03-15 DIAGNOSIS — E7800 Pure hypercholesterolemia, unspecified: Secondary | ICD-10-CM | POA: Diagnosis not present

## 2020-03-19 ENCOUNTER — Other Ambulatory Visit: Payer: Self-pay

## 2020-03-19 ENCOUNTER — Ambulatory Visit: Payer: PPO | Admitting: Podiatry

## 2020-03-19 DIAGNOSIS — L97421 Non-pressure chronic ulcer of left heel and midfoot limited to breakdown of skin: Secondary | ICD-10-CM | POA: Diagnosis not present

## 2020-03-19 NOTE — Progress Notes (Signed)
Subjective: 74 year old female presents the office today for follow-up evaluation of a wound on the left foot, heel.  Is been getting Vaseline on the area daily and she thinks that overall is getting better although slowly.  Start about 2 months ago.  She was originally seen by her primary care physician was prescribed antibiotics before being referred to Dr. Valentina Lucks saw her last appointment.  She, denies any swelling or redness or any drainage. Denies any systemic complaints such as fevers, chills, nausea, vomiting. No acute changes since last appointment, and no other complaints at this time.   Objective: AAO x3, NAD DP/PT pulses palpable bilaterally, CRT less than 3 seconds On the posterior aspect of the left calcaneus the hyperkeratotic tissue.  There is a 4 x 4 millimeter ulceration which is superficial starting to scab over and there is no probing, undermining or tunneling.  There is no surrounding erythema, ascending cellulitis.  No fluctuation crepitation there is no malodor. No open lesions or pre-ulcerative lesions.  No pain with calf compression, swelling, warmth, erythema  Assessment: 74 year old female left heel ulceration  Plan: -All treatment options discussed with the patient including all alternatives, risks, complications.  -Debrided the wound lightly without any complications.  We will switch to Iodosorb dressing changes daily.  Offloading pads were also dispensed. -Monitor for any clinical signs or symptoms of infection and directed to call the office immediately should any occur or go to the ER. -Patient encouraged to call the office with any questions, concerns, change in symptoms.   Trula Slade DPM

## 2020-03-19 NOTE — Patient Instructions (Signed)
You can wash the wound with soap and water. Dry very well. Apply a small amount of iodosorb to the wound daily and cover with a bandage.

## 2020-03-22 DIAGNOSIS — M810 Age-related osteoporosis without current pathological fracture: Secondary | ICD-10-CM | POA: Diagnosis not present

## 2020-04-09 ENCOUNTER — Ambulatory Visit (INDEPENDENT_AMBULATORY_CARE_PROVIDER_SITE_OTHER): Payer: PPO | Admitting: Podiatry

## 2020-04-09 ENCOUNTER — Other Ambulatory Visit: Payer: Self-pay

## 2020-04-09 DIAGNOSIS — L97421 Non-pressure chronic ulcer of left heel and midfoot limited to breakdown of skin: Secondary | ICD-10-CM | POA: Diagnosis not present

## 2020-04-11 DIAGNOSIS — L97421 Non-pressure chronic ulcer of left heel and midfoot limited to breakdown of skin: Secondary | ICD-10-CM | POA: Insufficient documentation

## 2020-04-11 NOTE — Progress Notes (Signed)
Subjective: 74 year old female presents the office today for follow-up evaluation of a wound on the left posterior heel.  She states that she is doing better she thinks and she is been keeping Vaseline on it.  She states the area is smaller and is not causing any pain.  Denies any swelling redness or any drainage. Denies any systemic complaints such as fevers, chills, nausea, vomiting. No acute changes since last appointment, and no other complaints at this time.   Objective: AAO x3, NAD DP/PT pulses palpable bilaterally, CRT less than 3 seconds To the posterior aspect of the left calcaneus is a hyperkeratotic lesion.  It appears that the wound is scabbed over and there is no drainage or pus there is no definitive open sore.  There is no surrounding erythema, ascending cellulitis there is no fluctuation or crepitation but there is no malodor. No open lesions or pre-ulcerative lesions.  No pain with calf compression, swelling, warmth, erythema  Assessment: Healing ulceration left heel  Plan: -All treatment options discussed with the patient including all alternatives, risks, complications.  -While he debrided some of the hyperkeratotic tissue the any complications or bleeding.  I want her to continue with daily dressing changes.  I would not apply Vaseline in the bandage.  She can keep just Vaseline on the area but leave it open. -Patient encouraged to call the office with any questions, concerns, change in symptoms.   Trula Slade DPM

## 2020-04-26 DIAGNOSIS — E039 Hypothyroidism, unspecified: Secondary | ICD-10-CM | POA: Diagnosis not present

## 2020-05-08 ENCOUNTER — Ambulatory Visit: Payer: PPO | Admitting: Podiatry

## 2020-06-12 DIAGNOSIS — E039 Hypothyroidism, unspecified: Secondary | ICD-10-CM | POA: Diagnosis not present

## 2020-06-12 DIAGNOSIS — R5383 Other fatigue: Secondary | ICD-10-CM | POA: Diagnosis not present

## 2020-07-06 DIAGNOSIS — Z23 Encounter for immunization: Secondary | ICD-10-CM | POA: Diagnosis not present

## 2020-11-15 ENCOUNTER — Encounter: Payer: Self-pay | Admitting: Podiatry

## 2020-11-15 ENCOUNTER — Ambulatory Visit (INDEPENDENT_AMBULATORY_CARE_PROVIDER_SITE_OTHER): Payer: PPO | Admitting: Podiatry

## 2020-11-15 ENCOUNTER — Other Ambulatory Visit: Payer: Self-pay

## 2020-11-15 DIAGNOSIS — L97421 Non-pressure chronic ulcer of left heel and midfoot limited to breakdown of skin: Secondary | ICD-10-CM | POA: Diagnosis not present

## 2020-11-15 DIAGNOSIS — L97409 Non-pressure chronic ulcer of unspecified heel and midfoot with unspecified severity: Secondary | ICD-10-CM

## 2020-11-15 MED ORDER — DOXYCYCLINE HYCLATE 100 MG PO TABS
100.0000 mg | ORAL_TABLET | Freq: Two times a day (BID) | ORAL | 0 refills | Status: DC
Start: 2020-11-15 — End: 2021-03-26

## 2020-11-18 NOTE — Progress Notes (Signed)
Subjective: 75 year old female presents the office today for evaluation of wound on left heel.  She says it never fully went away.  She does keep a dressing on the area.  Denies any drainage or pus or swelling or redness.  Said no treatment for the last saw her which was in June 2021. Denies any systemic complaints such as fevers, chills, nausea, vomiting. No acute changes since last appointment, and no other complaints at this time.   Objective: AAO x3, NAD DP/PT pulses palpable bilaterally, CRT less than 3 seconds To the plantar aspect of the heel proximally granular ulceration with minimal hyperkeratotic tissue periwound.  Mild macerated periwound.  There is no probing, undermining or tunneling.  Measures approximate 0.7 x 0.6 cm.  No pain with calf compression, swelling, warmth, erythema      Assessment: Chronic left heel ulceration  Plan: -All treatment options discussed with the patient including all alternatives, risks, complications.  -Although she is unable to palpate pulses given the nonhealing wound will order arterial studies. -Likely debrided the wound today without any complications or bleeding utilizing the 312 scalpel to help promote wound healing to debride nonviable devitalized tissue. -Iodosorb dressing changes daily. -Given the longevity of the wound I also prescribe doxycycline. -She came back to the office with surgical shoes that she has.  I was going to apply a pegassist in the shoe but it did not fit the style she has.  She has one that will fit at home.  Dispensed the PegAssist to her after I offloaded the ulcer.  I discussed with her foot inside her surgical shoe at home.  Monitor for any irritation or new issues.  If there is any irritation stop wearing this and to let me know immediately. -Patient encouraged to call the office with any questions, concerns, change in symptoms.   I spent 30 minutes with the patient coordinating patient care and the majority was in  face-to-face contact.  Trula Slade DPM

## 2020-11-20 ENCOUNTER — Other Ambulatory Visit (HOSPITAL_COMMUNITY): Payer: Self-pay | Admitting: Podiatry

## 2020-11-20 DIAGNOSIS — L97529 Non-pressure chronic ulcer of other part of left foot with unspecified severity: Secondary | ICD-10-CM

## 2020-11-27 ENCOUNTER — Other Ambulatory Visit: Payer: Self-pay

## 2020-11-27 ENCOUNTER — Ambulatory Visit (HOSPITAL_COMMUNITY)
Admission: RE | Admit: 2020-11-27 | Discharge: 2020-11-27 | Disposition: A | Discharge status: Home / Self Care | Payer: PPO | Source: Ambulatory Visit | Attending: Cardiology | Admitting: Cardiology

## 2020-11-27 DIAGNOSIS — L97529 Non-pressure chronic ulcer of other part of left foot with unspecified severity: Secondary | ICD-10-CM | POA: Insufficient documentation

## 2020-11-27 DIAGNOSIS — L97421 Non-pressure chronic ulcer of left heel and midfoot limited to breakdown of skin: Secondary | ICD-10-CM | POA: Insufficient documentation

## 2020-11-29 ENCOUNTER — Ambulatory Visit: Payer: PPO | Admitting: Podiatry

## 2020-11-29 ENCOUNTER — Other Ambulatory Visit: Payer: Self-pay

## 2020-11-29 DIAGNOSIS — L97421 Non-pressure chronic ulcer of left heel and midfoot limited to breakdown of skin: Secondary | ICD-10-CM | POA: Diagnosis not present

## 2020-11-29 DIAGNOSIS — L97409 Non-pressure chronic ulcer of unspecified heel and midfoot with unspecified severity: Secondary | ICD-10-CM

## 2020-11-30 ENCOUNTER — Telehealth: Payer: Self-pay

## 2020-11-30 DIAGNOSIS — L97412 Non-pressure chronic ulcer of right heel and midfoot with fat layer exposed: Secondary | ICD-10-CM | POA: Diagnosis not present

## 2020-11-30 NOTE — Telephone Encounter (Signed)
Wound care supplies form for heel wound 0.8 x 0.6 x 0.62faxed with office note and demographics for the following supplies to be used daily: Collagen Conforming roll gauze Sterile gauze 4 x 4

## 2020-11-30 NOTE — Progress Notes (Signed)
Subjective: 76 year old female presents the office today for evaluation of wound on left heel.  She previously Iodosorb on the wound daily.  Denies any drainage or pus coming from the area denies any swelling or redness.  Currently denies any fevers, chills, nausea, vomiting.  No calf pain, chest pain, shortness of breath.  She did have her circulation test performed as well.  She been using the offloading pad/shoe.  Objective: AAO x3, NAD DP/PT pulses palpable bilaterally, CRT less than 3 seconds To the plantar aspect of the heel proximally granular ulceration with minimal hyperkeratotic tissue periwound.  Macerated periwound has resolved there is no probing, undermining or tunneling.  Measures approximate 0.8 x 0.6 cm.  Wound is superficial without any probing, undermining or tunneling. No pain with calf compression, swelling, warmth, erythema  Assessment: Chronic left heel ulceration  Plan: -All treatment options discussed with the patient including all alternatives, risks, complications.  -Reviewed arterial studies with her.  Circulation should be adequate to heal the wound.  Decreased TBI but otherwise triphasic waveforms. -There is not significant tissue debrided today.  Recommend switch dressings.  For now recommend antibiotic ointment from when I also order her a silver collagen dressing to apply to the wound daily.  I have sent this over to [facility]. -Continue offloading. -Monitor for any clinical signs or symptoms of infection and directed to call the office immediately should any occur or go to the ER.  *If no improvement referral to the wound care center  Return for heel ulcer.  Trula Slade DPM

## 2020-12-20 ENCOUNTER — Ambulatory Visit (INDEPENDENT_AMBULATORY_CARE_PROVIDER_SITE_OTHER): Payer: PPO | Admitting: Podiatry

## 2020-12-20 ENCOUNTER — Other Ambulatory Visit: Payer: Self-pay

## 2020-12-20 DIAGNOSIS — L97401 Non-pressure chronic ulcer of unspecified heel and midfoot limited to breakdown of skin: Secondary | ICD-10-CM | POA: Diagnosis not present

## 2020-12-20 DIAGNOSIS — L97409 Non-pressure chronic ulcer of unspecified heel and midfoot with unspecified severity: Secondary | ICD-10-CM | POA: Diagnosis not present

## 2020-12-20 DIAGNOSIS — E08621 Diabetes mellitus due to underlying condition with foot ulcer: Secondary | ICD-10-CM

## 2020-12-25 NOTE — Progress Notes (Signed)
Subjective: 75 year old female presents the office today for evaluation of wound on left heel.  She has been using a collagen dressing.  She has not seen significant improvement.  Denies any surrounding redness or drainage or any swelling.  No fevers, chills, nausea, vomiting.  No other concerns today.   Objective: AAO x3, NAD DP/PT pulses palpable bilaterally, CRT less than 3 seconds To the plantar aspect of the heel proximally granular ulceration with minimal hyperkeratotic tissue periwound.   Measures approximate 0.7 x 0.5 cm.  Wound is superficial without any probing, undermining or tunneling.  No surrounding erythema, ascending cellulitis there is no fluctuation or crepitation but is noted. No pain with calf compression, swelling, warmth, erythema  Assessment: Chronic left heel ulceration  Plan: -All treatment options discussed with the patient including all alternatives, risks, complications.  -There is a mild improvement in the wound although minimal.  We discussed treatment options.  Would continue the collagen for now.  I am also try to get a graft ordered for her as well through [facility].  We discussed wound care referral as well today but decided to hold off on that which she is in agreement for.  Trula Slade DPM

## 2021-01-01 DIAGNOSIS — I10 Essential (primary) hypertension: Secondary | ICD-10-CM | POA: Diagnosis not present

## 2021-01-01 DIAGNOSIS — E039 Hypothyroidism, unspecified: Secondary | ICD-10-CM | POA: Diagnosis not present

## 2021-01-01 DIAGNOSIS — E785 Hyperlipidemia, unspecified: Secondary | ICD-10-CM | POA: Diagnosis not present

## 2021-01-01 DIAGNOSIS — E11610 Type 2 diabetes mellitus with diabetic neuropathic arthropathy: Secondary | ICD-10-CM | POA: Diagnosis not present

## 2021-01-03 ENCOUNTER — Ambulatory Visit: Payer: PPO | Admitting: Podiatry

## 2021-01-03 ENCOUNTER — Telehealth: Payer: Self-pay | Admitting: Podiatry

## 2021-01-03 ENCOUNTER — Other Ambulatory Visit: Payer: Self-pay

## 2021-01-03 DIAGNOSIS — L97401 Non-pressure chronic ulcer of unspecified heel and midfoot limited to breakdown of skin: Secondary | ICD-10-CM | POA: Diagnosis not present

## 2021-01-03 DIAGNOSIS — E08621 Diabetes mellitus due to underlying condition with foot ulcer: Secondary | ICD-10-CM | POA: Diagnosis not present

## 2021-01-03 DIAGNOSIS — L97409 Non-pressure chronic ulcer of unspecified heel and midfoot with unspecified severity: Secondary | ICD-10-CM

## 2021-01-03 NOTE — Telephone Encounter (Signed)
Patient has called her insurance as previously discuss.Would like to inform Dr. Jacqualyn Posey

## 2021-01-06 NOTE — Progress Notes (Signed)
Subjective: 75 year old female presents the office today for evaluation of wound on left heel.  She feels the wound is about the same.  She is continue with a collagen dressing.  It appears that the graft that I ordered has been approved by insurance however there is something with about the deductible or coinsurance that she is responsible for some of the cost but unsure this.   Objective: AAO x3, NAD DP/PT pulses palpable bilaterally, CRT less than 3 seconds To the plantar aspect of the heel proximally granular ulceration with minimal hyperkeratotic tissue periwound.   Measures approximate 0.7 x 0.5 cm.  Wound is superficial without any probing, undermining or tunneling.  No surrounding erythema, ascending cellulitis there is no fluctuation or crepitation but is noted.  The wound is unchanged. No pain with calf compression, swelling, warmth, erythema  Assessment: Chronic left heel ulceration  Plan: -All treatment options discussed with the patient including all alternatives, risks, complications.  -Today the wound is about the same.  Continue the collagen for now.  We will try to work with her insurance about the graft that we ordered through [facility]. -Discussed MRI of the wound but she was hold off.  She does not feel there is any infection.  Trula Slade DPM

## 2021-01-08 DIAGNOSIS — E039 Hypothyroidism, unspecified: Secondary | ICD-10-CM | POA: Diagnosis not present

## 2021-01-08 DIAGNOSIS — J309 Allergic rhinitis, unspecified: Secondary | ICD-10-CM | POA: Diagnosis not present

## 2021-01-08 DIAGNOSIS — E1140 Type 2 diabetes mellitus with diabetic neuropathy, unspecified: Secondary | ICD-10-CM | POA: Diagnosis not present

## 2021-01-08 DIAGNOSIS — E875 Hyperkalemia: Secondary | ICD-10-CM | POA: Diagnosis not present

## 2021-01-08 DIAGNOSIS — L97429 Non-pressure chronic ulcer of left heel and midfoot with unspecified severity: Secondary | ICD-10-CM | POA: Diagnosis not present

## 2021-01-08 DIAGNOSIS — Z23 Encounter for immunization: Secondary | ICD-10-CM | POA: Diagnosis not present

## 2021-01-08 DIAGNOSIS — E785 Hyperlipidemia, unspecified: Secondary | ICD-10-CM | POA: Diagnosis not present

## 2021-01-08 DIAGNOSIS — E11621 Type 2 diabetes mellitus with foot ulcer: Secondary | ICD-10-CM | POA: Diagnosis not present

## 2021-01-08 DIAGNOSIS — M810 Age-related osteoporosis without current pathological fracture: Secondary | ICD-10-CM | POA: Diagnosis not present

## 2021-01-08 DIAGNOSIS — Z0000 Encounter for general adult medical examination without abnormal findings: Secondary | ICD-10-CM | POA: Diagnosis not present

## 2021-01-08 DIAGNOSIS — I6529 Occlusion and stenosis of unspecified carotid artery: Secondary | ICD-10-CM | POA: Diagnosis not present

## 2021-01-15 DIAGNOSIS — M810 Age-related osteoporosis without current pathological fracture: Secondary | ICD-10-CM | POA: Diagnosis not present

## 2021-01-31 ENCOUNTER — Ambulatory Visit: Payer: PPO | Admitting: Podiatry

## 2021-01-31 ENCOUNTER — Other Ambulatory Visit: Payer: Self-pay

## 2021-01-31 DIAGNOSIS — G629 Polyneuropathy, unspecified: Secondary | ICD-10-CM | POA: Insufficient documentation

## 2021-01-31 DIAGNOSIS — K5730 Diverticulosis of large intestine without perforation or abscess without bleeding: Secondary | ICD-10-CM | POA: Insufficient documentation

## 2021-01-31 DIAGNOSIS — E042 Nontoxic multinodular goiter: Secondary | ICD-10-CM | POA: Insufficient documentation

## 2021-01-31 DIAGNOSIS — E08621 Diabetes mellitus due to underlying condition with foot ulcer: Secondary | ICD-10-CM | POA: Diagnosis not present

## 2021-01-31 DIAGNOSIS — Z9989 Dependence on other enabling machines and devices: Secondary | ICD-10-CM | POA: Insufficient documentation

## 2021-01-31 DIAGNOSIS — E785 Hyperlipidemia, unspecified: Secondary | ICD-10-CM | POA: Insufficient documentation

## 2021-01-31 DIAGNOSIS — J309 Allergic rhinitis, unspecified: Secondary | ICD-10-CM | POA: Insufficient documentation

## 2021-01-31 DIAGNOSIS — E7800 Pure hypercholesterolemia, unspecified: Secondary | ICD-10-CM | POA: Insufficient documentation

## 2021-01-31 DIAGNOSIS — Z6841 Body Mass Index (BMI) 40.0 and over, adult: Secondary | ICD-10-CM | POA: Insufficient documentation

## 2021-01-31 DIAGNOSIS — Z860100 Personal history of colon polyps, unspecified: Secondary | ICD-10-CM | POA: Insufficient documentation

## 2021-01-31 DIAGNOSIS — Z8781 Personal history of (healed) traumatic fracture: Secondary | ICD-10-CM | POA: Insufficient documentation

## 2021-01-31 DIAGNOSIS — E11610 Type 2 diabetes mellitus with diabetic neuropathic arthropathy: Secondary | ICD-10-CM | POA: Insufficient documentation

## 2021-01-31 DIAGNOSIS — L97402 Non-pressure chronic ulcer of unspecified heel and midfoot with fat layer exposed: Secondary | ICD-10-CM | POA: Diagnosis not present

## 2021-01-31 DIAGNOSIS — H9319 Tinnitus, unspecified ear: Secondary | ICD-10-CM | POA: Insufficient documentation

## 2021-01-31 DIAGNOSIS — N200 Calculus of kidney: Secondary | ICD-10-CM | POA: Insufficient documentation

## 2021-01-31 DIAGNOSIS — Z0001 Encounter for general adult medical examination with abnormal findings: Secondary | ICD-10-CM | POA: Insufficient documentation

## 2021-01-31 DIAGNOSIS — M6530 Trigger finger, unspecified finger: Secondary | ICD-10-CM | POA: Insufficient documentation

## 2021-01-31 DIAGNOSIS — L97421 Non-pressure chronic ulcer of left heel and midfoot limited to breakdown of skin: Secondary | ICD-10-CM

## 2021-01-31 DIAGNOSIS — B351 Tinea unguium: Secondary | ICD-10-CM | POA: Insufficient documentation

## 2021-01-31 DIAGNOSIS — Z880 Allergy status to penicillin: Secondary | ICD-10-CM | POA: Insufficient documentation

## 2021-01-31 DIAGNOSIS — I10 Essential (primary) hypertension: Secondary | ICD-10-CM | POA: Insufficient documentation

## 2021-01-31 DIAGNOSIS — I6529 Occlusion and stenosis of unspecified carotid artery: Secondary | ICD-10-CM | POA: Insufficient documentation

## 2021-01-31 DIAGNOSIS — E559 Vitamin D deficiency, unspecified: Secondary | ICD-10-CM | POA: Insufficient documentation

## 2021-01-31 DIAGNOSIS — Z8659 Personal history of other mental and behavioral disorders: Secondary | ICD-10-CM | POA: Insufficient documentation

## 2021-02-06 NOTE — Progress Notes (Signed)
Subjective: 75 year old female presents the office today for evaluation of wound on left heel.  She presents today for application of synthetic graft, PuraPly.  States the wound is doing about the same.  Denies any swelling or redness or any significant drainage.  She denies any fevers, chills, nausea, vomiting.  No calf pain, chest pain, shortness of breath.  Objective: AAO x3, NAD DP/PT pulses palpable bilaterally, CRT less than 3 seconds To the plantar aspect of the heel proximally granular ulceration with minimal hyperkeratotic tissue periwound.   Measures approximate 0.7 x 0.5 x 0.1 cm.  There is no probing, undermining or tunneling.  No surrounding erythema, ascending cellulitis there is no fluctuation or crepitation but is noted.  The wound is unchanged. No pain with calf compression, swelling, warmth, erythema  Assessment: Chronic left heel ulceration  Plan: -All treatment options discussed with the patient including all alternatives, risks, complications.  -1st graft applied- PuraPly   Application of synthetic skin graft/substitute  Name: PuraPly 3.76 x 3.76cm (Lot [id number] Exp 2023-03-24) Usage: 3.76 x 3.76 cm Graft was applied directly to the listed above wound site and secured with Adaptic, kerlix, Ace bandage. Waste: No graft material was wasted and was applied in its entirety as it was packed into the ulcer.  Blood loss: none Tolerated well, no complications  Trula Slade DPM

## 2021-02-07 ENCOUNTER — Ambulatory Visit: Payer: PPO | Admitting: Podiatry

## 2021-02-07 ENCOUNTER — Other Ambulatory Visit: Payer: Self-pay

## 2021-02-07 DIAGNOSIS — E785 Hyperlipidemia, unspecified: Secondary | ICD-10-CM | POA: Diagnosis not present

## 2021-02-07 DIAGNOSIS — L97421 Non-pressure chronic ulcer of left heel and midfoot limited to breakdown of skin: Secondary | ICD-10-CM | POA: Diagnosis not present

## 2021-02-07 DIAGNOSIS — E08621 Diabetes mellitus due to underlying condition with foot ulcer: Secondary | ICD-10-CM

## 2021-02-07 DIAGNOSIS — L97402 Non-pressure chronic ulcer of unspecified heel and midfoot with fat layer exposed: Secondary | ICD-10-CM | POA: Diagnosis not present

## 2021-02-07 DIAGNOSIS — E11610 Type 2 diabetes mellitus with diabetic neuropathic arthropathy: Secondary | ICD-10-CM | POA: Diagnosis not present

## 2021-02-07 DIAGNOSIS — I6529 Occlusion and stenosis of unspecified carotid artery: Secondary | ICD-10-CM | POA: Diagnosis not present

## 2021-02-07 DIAGNOSIS — E039 Hypothyroidism, unspecified: Secondary | ICD-10-CM | POA: Diagnosis not present

## 2021-02-09 NOTE — Progress Notes (Signed)
Subjective: 75 year old female presents the office today for evaluation of wound on left heel.  She presents today for second application of PuraPly.  She has not had any new issues that she reports.  She is contracting off the foot she is been using offloading shoe.  She denies any fevers, chills, nausea, vomiting.  No calf pain, chest pain, shortness of breath.  Objective: AAO x3, NAD DP/PT pulses palpable bilaterally, CRT less than 3 seconds To the plantar aspect of the heel proximally granular ulceration with minimal hyperkeratotic tissue periwound.   Measures the same at 0.7 x 0.5 x 0.1 cm.  The cath will be some budding present within the wound.  There is no probing, undermining or tunneling.  No surrounding erythema, ascending cellulitis there is no fluctuation or crepitation but is noted.  The wound is unchanged. No pain with calf compression, swelling, warmth, erythema  Assessment: Chronic left heel ulceration  Plan: -All treatment options discussed with the patient including all alternatives, risks, complications.  -Second graft applied- PuraPly   Application of synthetic skin graft/substitute  Name: PuraPly 3.76 x 3.76cm (see clinical log for lot and expiration) Usage: 3.76 x 3.76 cm Graft was applied directly to the listed above wound site and secured with Adaptic, kerlix, Ace bandage. Waste: No graft material was wasted and was applied in its entirety as it was packed into the ulcer.  Prior to debridement the wound was debrided with a curette and hemostasis achieved. Blood loss: none Tolerated well, no complications We will try to switch to Apligraf or other graft which was approved next appointment.  Trula Slade DPM

## 2021-02-14 ENCOUNTER — Other Ambulatory Visit: Payer: Self-pay

## 2021-02-14 ENCOUNTER — Ambulatory Visit: Payer: PPO | Admitting: Podiatry

## 2021-02-14 DIAGNOSIS — E08621 Diabetes mellitus due to underlying condition with foot ulcer: Secondary | ICD-10-CM

## 2021-02-14 DIAGNOSIS — L97401 Non-pressure chronic ulcer of unspecified heel and midfoot limited to breakdown of skin: Secondary | ICD-10-CM

## 2021-02-14 DIAGNOSIS — L97402 Non-pressure chronic ulcer of unspecified heel and midfoot with fat layer exposed: Secondary | ICD-10-CM

## 2021-02-20 NOTE — Progress Notes (Signed)
Subjective: 75 year old female presents the office today for evaluation of wound on left heel.  Is been a chronic issue and she has had 2 applications of PuraPly.  She presented to today for possible further graft application.  Denies any fevers, chills, nausea, vomiting.  No calf pain, chest pain, shortness of breath.    Objective: AAO x3, NAD DP/PT pulses palpable bilaterally, CRT less than 3 seconds To the plantar aspect of the heel proximally granular ulceration with minimal hyperkeratotic tissue periwound.   Measures the same at 0.7 x 0.5 x 0.1 cm.  There is macerated tissue on the periwound.  There is bloody drainage on the bandage.  There is no purulence.  Ulcer continues to be superficial without any probing, undermining or tunneling.  No pain with calf compression, swelling, warmth, erythema  Assessment: Chronic left heel ulceration  Plan: -All treatment options discussed with the patient including all alternatives, risks, complications.  -As the wound has not changed has not applied for the graft today.  There is also macerated periwound.  We will switch to daily dressing changes. *CONISDER BIOPSY NEXT APPOINTMENT GIVEN THE CHRONIC NATURE OF THE ULCER   Trula Slade DPM

## 2021-02-21 ENCOUNTER — Other Ambulatory Visit: Payer: Self-pay

## 2021-02-21 ENCOUNTER — Ambulatory Visit: Payer: PPO | Admitting: Podiatry

## 2021-02-21 ENCOUNTER — Other Ambulatory Visit: Payer: Self-pay | Admitting: Podiatry

## 2021-02-21 DIAGNOSIS — C4372 Malignant melanoma of left lower limb, including hip: Secondary | ICD-10-CM | POA: Diagnosis not present

## 2021-02-21 DIAGNOSIS — E08621 Diabetes mellitus due to underlying condition with foot ulcer: Secondary | ICD-10-CM

## 2021-02-21 DIAGNOSIS — L97402 Non-pressure chronic ulcer of unspecified heel and midfoot with fat layer exposed: Secondary | ICD-10-CM

## 2021-02-26 NOTE — Progress Notes (Signed)
Subjective: 75 year old female presents the office today for evaluation of wound on left heel.  The wound has been a chronic issue for her.  She states that she changed her pain as 1 times in the last saw her but she felt the skin was peeling so she did not change it again.  She denies any fevers or chills.  No nausea or vomiting.  No other concerns.  Objective: AAO x3, NAD DP/PT pulses palpable bilaterally, CRT less than 3 seconds To the plantar aspect of the heel proximally granular ulceration with minimal hyperkeratotic tissue periwound.   Measures the same at 0.7 x 0.5 x 0.1 cm.  The periphery of the wound does appear to have some increased bloating, skin growth but overall the wound is about the same size.  There is slight macerated tissue from where she is with the bandage on.  There is no surrounding erythema, ascending cellulitis.  No fluctuation crepitation.  No malodor.  No pain with calf compression, swelling, warmth, erythema  Assessment: Chronic left heel ulceration  Plan: -All treatment options discussed with the patient including all alternatives, risks, complications.  -At this point the wound has not changed much has been a chronic issue.  Due to this, recommended biopsy.  Discussed risks and benefits of this and she wishes to proceed.  The skin was prepped with alcohol and a mixture of 2 cc of lidocaine, Marcaine plain was infiltrated subdermally underneath the ulcer.  Skin was then prepped with Betadine.  I then utilized a 4 mm punch biopsy and removed a piece of tissue from the wound.  This was sent to pathology for evaluation.  Hemostasis was achieved and the incision was irrigated.  Antibiotic ointment and dressing applied.  Discussed with her dressing changes.  She tolerated the procedure well and complications.  Monitoring signs or symptoms of infection. -If benign we will continue grafting versus surgical excision.  Trula Slade DPM

## 2021-02-27 ENCOUNTER — Telehealth: Payer: Self-pay | Admitting: *Deleted

## 2021-02-27 ENCOUNTER — Other Ambulatory Visit: Payer: Self-pay | Admitting: Podiatry

## 2021-02-27 DIAGNOSIS — C4370 Malignant melanoma of unspecified lower limb, including hip: Secondary | ICD-10-CM

## 2021-02-27 LAB — PATHOLOGY REPORT

## 2021-02-27 LAB — TISSUE SPECIMEN

## 2021-02-27 NOTE — Telephone Encounter (Signed)
I have called the patient to go over the results, which show melanoma. I have put in a referral to [facility]. Lattie Haw- can you please fax it over and please call that office to see if we can get the patient in ASAP? Barbaraann Rondo- can you please also help follow up on this? The patient has my cell phone number but when we hear about an appointment date, please let the patient know.Thanks!

## 2021-02-27 NOTE — Telephone Encounter (Signed)
Spoke with [facility] representative yesterday (Destiny) and the biopsy is still pending. Destiny [name]

## 2021-02-27 NOTE — Telephone Encounter (Signed)
Ivin Booty w/ [facility] is calling to make physician aware and to make sure report was received of the Biopsy w/ significant results. Test results are in epic for reviewing.Any questions,may be reached at: [phone].

## 2021-02-28 ENCOUNTER — Telehealth: Payer: Self-pay | Admitting: Podiatry

## 2021-02-28 ENCOUNTER — Telehealth: Payer: Self-pay | Admitting: *Deleted

## 2021-02-28 NOTE — Telephone Encounter (Signed)
Yes, the patient is aware. I called her last night and told her.

## 2021-02-28 NOTE — Telephone Encounter (Signed)
[facility] calling to inform Dr. Jacqualyn Posey that Dr. Barry Dienes will treat melanoma on left foot. Would like to know is the patient aware of the diagnosis? If so patient is ready for scheduling.

## 2021-02-28 NOTE — Telephone Encounter (Signed)
Faxed referral and most chart notes,biopsy results to [facility](Dr Byerly),received confirmation success.

## 2021-02-28 NOTE — Telephone Encounter (Signed)
Spoke with [name] at [facility] and the earliest appointment is for June 10th arrival time is 11:00 am for a 11:30 am appointment and I have called and left a message for the patient and to call if any concerns or questions. Destiny [name]

## 2021-02-28 NOTE — Telephone Encounter (Signed)
I have been in touch with [name] at the [facility] today and we have been playing phone tag and I have left two messages and I did give the [place] office phone number [phone] and to call me back to schedule the patient. Destiny [name]

## 2021-02-28 NOTE — Telephone Encounter (Signed)
[facility] for patient referral, stated that they do not treat anything dealing with feet,hands and face. Please advise

## 2021-03-07 ENCOUNTER — Ambulatory Visit: Payer: PPO | Admitting: Podiatry

## 2021-03-21 ENCOUNTER — Other Ambulatory Visit: Payer: Self-pay | Admitting: General Surgery

## 2021-03-21 DIAGNOSIS — C4372 Malignant melanoma of left lower limb, including hip: Secondary | ICD-10-CM

## 2021-03-22 ENCOUNTER — Other Ambulatory Visit: Payer: Self-pay | Admitting: General Surgery

## 2021-03-22 DIAGNOSIS — C4372 Malignant melanoma of left lower limb, including hip: Secondary | ICD-10-CM | POA: Diagnosis not present

## 2021-03-25 ENCOUNTER — Encounter (HOSPITAL_COMMUNITY): Payer: Self-pay | Admitting: General Surgery

## 2021-03-25 NOTE — Anesthesia Preprocedure Evaluation (Addendum)
Anesthesia Evaluation  Patient identified by MRN, date of birth, ID band Patient awake    Reviewed: Allergy & Precautions, Patient's Chart, lab work & pertinent test results  History of Anesthesia Complications (+) PONV and history of anesthetic complications  Airway Mallampati: II  TM Distance: >3 FB Neck ROM: Full    Dental  (+) Upper Dentures   Pulmonary sleep apnea , former smoker,    Pulmonary exam normal        Cardiovascular hypertension,  Rhythm:Regular Rate:Normal     Neuro/Psych negative neurological ROS  negative psych ROS   GI/Hepatic negative GI ROS, Neg liver ROS,   Endo/Other  diabetes, Type 2, Oral Hypoglycemic AgentsHypothyroidism Morbid obesity  Renal/GU negative Renal ROS  negative genitourinary   Musculoskeletal negative musculoskeletal ROS (+)   Abdominal (+)  Abdomen: soft. Bowel sounds: normal.  Peds  Hematology negative hematology ROS (+)   Anesthesia Other Findings Day of surgery medications reviewed with patient.  Reproductive/Obstetrics negative OB ROS                            Anesthesia Physical Anesthesia Plan  ASA: 3  Anesthesia Plan: General   Post-op Pain Management: GA combined w/ Regional for post-op pain   Induction: Intravenous  PONV Risk Score and Plan: 4 or greater and Treatment may vary due to age or medical condition, Dexamethasone, Ondansetron, Aprepitant and Scopolamine patch - Pre-op  Airway Management Planned: LMA and Mask  Additional Equipment: None  Intra-op Plan:   Post-operative Plan: Extubation in OR  Informed Consent: I have reviewed the patients History and Physical, chart, labs and discussed the procedure including the risks, benefits and alternatives for the proposed anesthesia with the patient or authorized representative who has indicated his/her understanding and acceptance.     Dental advisory given  Plan  Discussed with: CRNA  Anesthesia Plan Comments:       Anesthesia Quick Evaluation

## 2021-03-25 NOTE — H&P (View-Only) (Signed)
Destiny [name] Appointment: 03/22/2021 11:30 AM Location: [facility] Patient #: [id number] DOB: 1946-07-18 Divorced / Language: Destiny [name] / Race: White Female   History of Present Illness Destiny Klein MD; 03/21/2021 12:09 PM) The patient is a 75 year old female who presents with malignant melanoma. Pt is a 75 yo F referred by Dr. 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  When it did not heal, a biopsy was performed and was positive for melanoma, 3.1 mm breslow thickness, + ulceration, positive peripheral margins, 2 mit/mm2, no LVI, satellitosis, or perineural invasion.  This was at least pT3b.    She has no prior dx of cancer before the melanoma.  She had a Destiny [name] with ovarian cancer and lung cancer, and a Destiny [name] with throat cancer.  She has DM.   Past Surgical History (Destiny Little, CNA; 03/22/2021 12:10 PM) Foot Surgery   Left. Lap Band    Diagnostic Studies History (Destiny Little, CNA; 03/22/2021 12:10 PM) Mammogram   1-3 years ago Pap Smear   >5 years ago  Allergies (Destiny Little, CNA; 03/22/2021 12:11 PM) Allergies Reconciled    Medication History (Destiny Little, CNA; 03/22/2021 12:11 PM) Cyclobenzaprine HCl  (10MG  Tablet, Oral) Active. Doxycycline Hyclate  (100MG  Tablet, Oral) Active. Ezetimibe  (10MG  Tablet, Oral) Active. Levothyroxine Sodium  (100MCG Tablet, Oral) Active. Levothyroxine Sodium  (125MCG Capsule, Oral) Active. Levothyroxine Sodium  (112MCG Tablet, Oral) Active. metFORMIN HCl ER  (500MG  Tablet ER 24HR, Oral) Active. Ozempic (1 MG/DOSE)  (4MG /3ML Soln Pen-inj, Subcutaneous) Active. Ozempic (1 MG/DOSE)  (2MG /1.5ML Soln Pen-inj, Subcutaneous) Active. Pravastatin Sodium  (40MG  Tablet, Oral) Active. Medications Reconciled   Social History (Destiny Little, CNA; 03/22/2021 12:10 PM) Caffeine use   Carbonated beverages. No alcohol use   No drug use    Tobacco use   Former smoker.  Family History (Destiny Little, CNA; 03/22/2021 12:10 PM) Alcohol Abuse   Destiny [name], Father. Anesthetic complications   Family Members In General. Arthritis   Mother. Cancer   Destiny [name]. Cerebrovascular Accident   Mother. Diabetes Mellitus   Destiny [name], Family Members In [place], Mother. Hypertension   Destiny [name]. Kidney Disease   Destiny [name]. Ovarian Cancer   Destiny [name]. Respiratory Condition   Destiny [name]. Thyroid problems   Family Members In [place], Son.  Pregnancy / Birth History (Destiny Little, CNA; 03/22/2021 12:10 PM) Age at menarche   35 years. Age of menopause   <43 Contraceptive History   Intrauterine device. Gravida   1 Irregular periods   Maternal age   42-25 Para   26  Other Problems (Destiny Little, CNA; 03/22/2021 12:10 PM) Back Pain   Diabetes Mellitus   Hemorrhoids   Kidney Stone   Sleep Apnea   Thyroid Disease   Transfusion history      Review of Systems (Destiny Little CNA; 03/22/2021 12:10 PM) General Not Present- Appetite Loss, Chills, Fatigue, Fever, Night Sweats, Weight Gain and Weight Loss. Skin Present- Non-Healing Wounds and Ulcer. Not Present- Change in Wart/Mole, Dryness, Hives, Jaundice, New Lesions and Rash. HEENT Present- Hearing Loss and Ringing in the Ears. Not Present- Earache, Hoarseness, Nose Bleed, Oral Ulcers, Seasonal Allergies, Sinus Pain, Sore Throat, Visual Disturbances, Wears glasses/contact lenses and Yellow Eyes. Respiratory Present- Snoring. Not Present- Bloody sputum, Chronic Cough, Difficulty Breathing and Wheezing. Breast Not Present- Breast Mass, Breast Pain, Nipple Discharge and Skin Changes. Cardiovascular Not Present- Chest Pain, Difficulty Breathing Lying Down, Leg Cramps,  Palpitations, Rapid Heart Rate, Shortness of Breath and Swelling of Extremities. Gastrointestinal Present- Chronic diarrhea. Not Present- Abdominal Pain, Bloating, Bloody Stool, Change in Bowel Habits, Constipation, Difficulty Swallowing,  Excessive gas, Gets full quickly at meals, Hemorrhoids, Indigestion, Nausea, Rectal Pain and Vomiting. Female Genitourinary Not Present- Frequency, Nocturia, Painful Urination, Pelvic Pain and Urgency. Musculoskeletal Not Present- Back Pain, Joint Pain, Joint Stiffness, Muscle Pain, Muscle Weakness and Swelling of Extremities. Neurological Not Present- Decreased Memory, Fainting, Headaches, Numbness, Seizures, Tingling, Tremor, Trouble walking and Weakness. Psychiatric Not Present- Anxiety, Bipolar, Change in Sleep Pattern, Depression, Fearful and Frequent crying. Endocrine Not Present- Cold Intolerance, Excessive Hunger, Hair Changes, Heat Intolerance, Hot flashes and New Diabetes. Hematology Present- Easy Bruising. Not Present- Blood Thinners, Excessive bleeding, Gland problems, HIV and Persistent Infections.  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 I will plan excision with temporary closure and sentinel node biopsy.  I discussed the procedure with the patient including risks and post op expectations. I reviewed risk of bleeding, infection, chronic numbness, difficulty healing, seroma of groin incision, possible recurrent cancer, possible need for additional surgery or treatment.  She agrees to proceed. I will likely get plastics involved for assistance with wound once I know margins are negative.  I discussed that if nodes are positive, I will order a PET and send to oncology.  She also will need a skin check with dermatology.  I discussed the possibility of drain placement as well. Current  Plans You are being scheduled for surgery - Our schedulers will call you.   You should hear from our office's scheduling department within 5 working days about the location, date, and time of surgery.  We try to make accommodations for patient's preferences in scheduling surgery, but sometimes the OR schedule or the surgeon's schedule prevents [place] from making those accommodations.  If you have not heard from our office [phone]) in 5 working days, call the office and ask for your surgeon's nurse.  If you have other questions about your diagnosis, plan, or surgery, call the office and ask for your surgeon's nurse.  Pt Education - Melanoma: skin cancer Referred to Dermatology (Dermatology). Routine. Referred to Surgery - Plastic, for evaluation and follow up (Plastic Surgery). Routine.   Signed electronically by Destiny Klein, MD (03/22/2021 2:02 PM)

## 2021-03-25 NOTE — H&P (Signed)
Aviva Kluver Appointment: 03/22/2021 11:30 AM Location: [facility] Patient #: [id number] DOB: 1946/02/23 Divorced / Language: Cleophus Molt / Race: White Female   History of Present Illness [name] MD; 03/21/2021 12:09 PM) The patient is a 75 year old female who presents with malignant melanoma. Pt is a 75 yo F referred by Dr. 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  When it did not heal, a biopsy was performed and was positive for melanoma, 3.1 mm breslow thickness, + ulceration, positive peripheral margins, 2 mit/mm2, no LVI, satellitosis, or perineural invasion.  This was at least pT3b.    She has no prior dx of cancer before the melanoma.  She had a sister with ovarian cancer and lung cancer, and a brother with throat cancer.  She has DM.   Past Surgical History (Charmella Little, CNA; 03/22/2021 12:10 PM) Foot Surgery   Left. Lap Band    Diagnostic Studies History (Charmella Little, CNA; 03/22/2021 12:10 PM) Mammogram   1-3 years ago Pap Smear   >5 years ago  Allergies (Charmella Little, CNA; 03/22/2021 12:11 PM) Allergies Reconciled    Medication History (Charmella Little, CNA; 03/22/2021 12:11 PM) Cyclobenzaprine HCl  (10MG  Tablet, Oral) Active. Doxycycline Hyclate  (100MG  Tablet, Oral) Active. Ezetimibe  (10MG  Tablet, Oral) Active. Levothyroxine Sodium  (100MCG Tablet, Oral) Active. Levothyroxine Sodium  (125MCG Capsule, Oral) Active. Levothyroxine Sodium  (112MCG Tablet, Oral) Active. metFORMIN HCl ER  (500MG  Tablet ER 24HR, Oral) Active. Ozempic (1 MG/DOSE)  (4MG /3ML Soln Pen-inj, Subcutaneous) Active. Ozempic (1 MG/DOSE)  (2MG /1.5ML Soln Pen-inj, Subcutaneous) Active. Pravastatin Sodium  (40MG  Tablet, Oral) Active. Medications Reconciled   Social History (Charmella Little, CNA; 03/22/2021 12:10 PM) Caffeine use   Carbonated beverages. No alcohol use   No drug use    Tobacco use   Former smoker.  Family History (Charmella Little, CNA; 03/22/2021 12:10 PM) Alcohol Abuse   Brother, Father. Anesthetic complications   Family Members In General. Arthritis   Mother. Cancer   Sister. Cerebrovascular Accident   Mother. Diabetes Mellitus   Brother, Family Members In [place], Mother. Hypertension   Sister. Kidney Disease   Sister. Ovarian Cancer   Sister. Respiratory Condition   Sister. Thyroid problems   Family Members In [place], Son.  Pregnancy / Birth History (Charmella Little, CNA; 03/22/2021 12:10 PM) Age at menarche   49 years. Age of menopause   <43 Contraceptive History   Intrauterine device. Gravida   1 Irregular periods   Maternal age   71-25 Para   56  Other Problems (Charmella Little, CNA; 03/22/2021 12:10 PM) Back Pain   Diabetes Mellitus   Hemorrhoids   Kidney Stone   Sleep Apnea   Thyroid Disease   Transfusion history      Review of Systems (Charmella Little CNA; 03/22/2021 12:10 PM) General Not Present- Appetite Loss, Chills, Fatigue, Fever, Night Sweats, Weight Gain and Weight Loss. Skin Present- Non-Healing Wounds and Ulcer. Not Present- Change in Wart/Mole, Dryness, Hives, Jaundice, New Lesions and Rash. HEENT Present- Hearing Loss and Ringing in the Ears. Not Present- Earache, Hoarseness, Nose Bleed, Oral Ulcers, Seasonal Allergies, Sinus Pain, Sore Throat, Visual Disturbances, Wears glasses/contact lenses and Yellow Eyes. Respiratory Present- Snoring. Not Present- Bloody sputum, Chronic Cough, Difficulty Breathing and Wheezing. Breast Not Present- Breast Mass, Breast Pain, Nipple Discharge and Skin Changes. Cardiovascular Not Present- Chest Pain, Difficulty Breathing Lying Down, Leg Cramps,  Palpitations, Rapid Heart Rate, Shortness of Breath and Swelling of Extremities. Gastrointestinal Present- Chronic diarrhea. Not Present- Abdominal Pain, Bloating, Bloody Stool, Change in Bowel Habits, Constipation, Difficulty Swallowing,  Excessive gas, Gets full quickly at meals, Hemorrhoids, Indigestion, Nausea, Rectal Pain and Vomiting. Female Genitourinary Not Present- Frequency, Nocturia, Painful Urination, Pelvic Pain and Urgency. Musculoskeletal Not Present- Back Pain, Joint Pain, Joint Stiffness, Muscle Pain, Muscle Weakness and Swelling of Extremities. Neurological Not Present- Decreased Memory, Fainting, Headaches, Numbness, Seizures, Tingling, Tremor, Trouble walking and Weakness. Psychiatric Not Present- Anxiety, Bipolar, Change in Sleep Pattern, Depression, Fearful and Frequent crying. Endocrine Not Present- Cold Intolerance, Excessive Hunger, Hair Changes, Heat Intolerance, Hot flashes and New Diabetes. Hematology Present- Easy Bruising. Not Present- Blood Thinners, Excessive bleeding, Gland problems, HIV and Persistent Infections.  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 I will plan excision with temporary closure and sentinel node biopsy.  I discussed the procedure with the patient including risks and post op expectations. I reviewed risk of bleeding, infection, chronic numbness, difficulty healing, seroma of groin incision, possible recurrent cancer, possible need for additional surgery or treatment.  She agrees to proceed. I will likely get plastics involved for assistance with wound once I know margins are negative.  I discussed that if nodes are positive, I will order a PET and send to oncology.  She also will need a skin check with dermatology.  I discussed the possibility of drain placement as well. Current  Plans You are being scheduled for surgery - Our schedulers will call you.   You should hear from our office's scheduling department within 5 working days about the location, date, and time of surgery.  We try to make accommodations for patient's preferences in scheduling surgery, but sometimes the OR schedule or the surgeon's schedule prevents [place] from making those accommodations.  If you have not heard from our office [phone]) in 5 working days, call the office and ask for your surgeon's nurse.  If you have other questions about your diagnosis, plan, or surgery, call the office and ask for your surgeon's nurse.  Pt Education - Melanoma: skin cancer Referred to Dermatology (Dermatology). Routine. Referred to Surgery - Plastic, for evaluation and follow up (Plastic Surgery). Routine.   Signed electronically by [name], MD (03/22/2021 2:02 PM)

## 2021-03-25 NOTE — Progress Notes (Signed)
PCP - Dr. Deland Pretty Cardiologist - denies  PPM/ICD - denies  Chest x-ray - denies EKG - requested but DOS Stress Test - denies ECHO - denies Cardiac Cath - denies  Sleep Study - yes but does not wear CPAP CPAP - does not wear  Fasting Blood Sugar - 118-120 Checks Blood Sugar on occasion  Patient instructed to check blood sugars q2 hrs until arrival to hospital.  Patient verbalizes understanding.  Patient instructed to drink 1/2 cup of clear juice if blood sugar is less than 70 and then to recheck blood sugar after 15 minutes.   Aspirin Instructions: last dose was 03/22/21  ERAS Protcol - clears until 1230  COVID TEST- ambulatory surgery   Anesthesia review: no  Patient denies shortness of breath, fever, cough and chest pain at PAT appointment   All instructions explained to the patient, with a verbal understanding of the material.  Patient also instructed to self quarantine after being tested for COVID-19. The opportunity to ask questions was provided.

## 2021-03-26 ENCOUNTER — Encounter (HOSPITAL_COMMUNITY): Payer: Self-pay | Admitting: General Surgery

## 2021-03-26 ENCOUNTER — Ambulatory Visit (HOSPITAL_COMMUNITY): Payer: PPO | Admitting: Anesthesiology

## 2021-03-26 ENCOUNTER — Ambulatory Visit (HOSPITAL_COMMUNITY)
Admission: RE | Admit: 2021-03-26 | Discharge: 2021-03-26 | Disposition: A | Discharge status: Home / Self Care | Payer: PPO | Source: Ambulatory Visit | Attending: General Surgery | Admitting: General Surgery

## 2021-03-26 ENCOUNTER — Ambulatory Visit (HOSPITAL_COMMUNITY)
Admission: RE | Admit: 2021-03-26 | Discharge: 2021-03-26 | Disposition: A | Discharge status: Home / Self Care | Payer: PPO | Attending: General Surgery | Admitting: General Surgery

## 2021-03-26 ENCOUNTER — Encounter (HOSPITAL_COMMUNITY)
Admission: RE | Disposition: A | Discharge status: Home / Self Care | Payer: Self-pay | Source: Home / Self Care | Attending: General Surgery

## 2021-03-26 ENCOUNTER — Other Ambulatory Visit: Payer: Self-pay

## 2021-03-26 DIAGNOSIS — D360 Benign neoplasm of lymph nodes: Secondary | ICD-10-CM | POA: Insufficient documentation

## 2021-03-26 DIAGNOSIS — E119 Type 2 diabetes mellitus without complications: Secondary | ICD-10-CM | POA: Diagnosis not present

## 2021-03-26 DIAGNOSIS — Z9884 Bariatric surgery status: Secondary | ICD-10-CM | POA: Diagnosis not present

## 2021-03-26 DIAGNOSIS — Z833 Family history of diabetes mellitus: Secondary | ICD-10-CM | POA: Insufficient documentation

## 2021-03-26 DIAGNOSIS — R590 Localized enlarged lymph nodes: Secondary | ICD-10-CM | POA: Diagnosis not present

## 2021-03-26 DIAGNOSIS — Z8041 Family history of malignant neoplasm of ovary: Secondary | ICD-10-CM | POA: Diagnosis not present

## 2021-03-26 DIAGNOSIS — C4372 Malignant melanoma of left lower limb, including hip: Secondary | ICD-10-CM | POA: Diagnosis not present

## 2021-03-26 DIAGNOSIS — Z8719 Personal history of other diseases of the digestive system: Secondary | ICD-10-CM | POA: Diagnosis not present

## 2021-03-26 DIAGNOSIS — Z801 Family history of malignant neoplasm of trachea, bronchus and lung: Secondary | ICD-10-CM | POA: Insufficient documentation

## 2021-03-26 DIAGNOSIS — Z87891 Personal history of nicotine dependence: Secondary | ICD-10-CM | POA: Insufficient documentation

## 2021-03-26 DIAGNOSIS — Z808 Family history of malignant neoplasm of other organs or systems: Secondary | ICD-10-CM | POA: Insufficient documentation

## 2021-03-26 DIAGNOSIS — G4730 Sleep apnea, unspecified: Secondary | ICD-10-CM | POA: Insufficient documentation

## 2021-03-26 DIAGNOSIS — E039 Hypothyroidism, unspecified: Secondary | ICD-10-CM | POA: Diagnosis not present

## 2021-03-26 DIAGNOSIS — E7800 Pure hypercholesterolemia, unspecified: Secondary | ICD-10-CM | POA: Diagnosis not present

## 2021-03-26 HISTORY — DX: Malignant (primary) neoplasm, unspecified: C80.1

## 2021-03-26 HISTORY — PX: EXCISION MELANOMA WITH SENTINEL LYMPH NODE BIOPSY: SHX5628

## 2021-03-26 HISTORY — DX: Other specified postprocedural states: R11.2

## 2021-03-26 HISTORY — DX: Hypothyroidism, unspecified: E03.9

## 2021-03-26 HISTORY — DX: Other specified postprocedural states: Z98.890

## 2021-03-26 LAB — CBC WITH DIFFERENTIAL/PLATELET
Abs Immature Granulocytes: 0.02 10*3/uL (ref 0.00–0.07)
Basophils Absolute: 0 10*3/uL (ref 0.0–0.1)
Basophils Relative: 0 %
Eosinophils Absolute: 0.2 10*3/uL (ref 0.0–0.5)
Eosinophils Relative: 3 %
HCT: 40.5 % (ref 36.0–46.0)
Hemoglobin: 12.6 g/dL (ref 12.0–15.0)
Immature Granulocytes: 0 %
Lymphocytes Relative: 39 %
Lymphs Abs: 2.4 10*3/uL (ref 0.7–4.0)
MCH: 28.6 pg (ref 26.0–34.0)
MCHC: 31.1 g/dL (ref 30.0–36.0)
MCV: 92 fL (ref 80.0–100.0)
Monocytes Absolute: 0.4 10*3/uL (ref 0.1–1.0)
Monocytes Relative: 6 %
Neutro Abs: 3.1 10*3/uL (ref 1.7–7.7)
Neutrophils Relative %: 52 %
Platelets: 247 10*3/uL (ref 150–400)
RBC: 4.4 MIL/uL (ref 3.87–5.11)
RDW: 14.5 % (ref 11.5–15.5)
WBC: 6.1 10*3/uL (ref 4.0–10.5)
nRBC: 0 % (ref 0.0–0.2)

## 2021-03-26 LAB — COMPREHENSIVE METABOLIC PANEL
ALT: 9 U/L (ref 0–44)
AST: 18 U/L (ref 15–41)
Albumin: 3.8 g/dL (ref 3.5–5.0)
Alkaline Phosphatase: 55 U/L (ref 38–126)
Anion gap: 7 (ref 5–15)
BUN: 12 mg/dL (ref 8–23)
CO2: 26 mmol/L (ref 22–32)
Calcium: 8.4 mg/dL — ABNORMAL LOW (ref 8.9–10.3)
Chloride: 106 mmol/L (ref 98–111)
Creatinine, Ser: 0.91 mg/dL (ref 0.44–1.00)
GFR, Estimated: >60 mL/min (ref >60)
Glucose, Bld: 78 mg/dL (ref 70–99)
Potassium: 4.1 mmol/L (ref 3.5–5.1)
Sodium: 139 mmol/L (ref 135–145)
Total Bilirubin: 0.7 mg/dL (ref 0.3–1.2)
Total Protein: 6.4 g/dL — ABNORMAL LOW (ref 6.5–8.1)

## 2021-03-26 LAB — PROTIME-INR
INR: 1 (ref 0.8–1.2)
Prothrombin Time: 13.5 seconds (ref 11.4–15.2)

## 2021-03-26 LAB — GLUCOSE, CAPILLARY
Glucose-Capillary: 92 mg/dL (ref 70–99)
Glucose-Capillary: 88 mg/dL (ref 70–99)

## 2021-03-26 SURGERY — EXCISION, MELANOMA, WITH SENTINEL LYMPH NODE BIOPSY
Anesthesia: General | Site: Heel | Laterality: Left

## 2021-03-26 MED ORDER — CHLORHEXIDINE GLUCONATE 0.12 % MT SOLN
15.0000 mL | Freq: Once | OROMUCOSAL | Status: AC
  Administered 2021-03-26: 15 mL via OROMUCOSAL

## 2021-03-26 MED ORDER — ACETAMINOPHEN 500 MG PO TABS
1000.0000 mg | ORAL_TABLET | Freq: Once | ORAL | Status: AC
  Administered 2021-03-26: 1000 mg via ORAL
  Filled 2021-03-26: qty 2

## 2021-03-26 MED ORDER — CHLORHEXIDINE GLUCONATE CLOTH 2 % EX PADS: 6.0000 | MEDICATED_PAD | Freq: Once | CUTANEOUS | Status: DC

## 2021-03-26 MED ORDER — BUPIVACAINE-EPINEPHRINE (PF) 0.25% -1:200000 IJ SOLN
INTRAMUSCULAR | Status: AC
  Filled 2021-03-26: qty 30

## 2021-03-26 MED ORDER — DEXAMETHASONE SODIUM PHOSPHATE 10 MG/ML IJ SOLN
4.0000 mg | INTRAMUSCULAR | Status: DC
  Filled 2021-03-26: qty 1

## 2021-03-26 MED ORDER — FENTANYL CITRATE (PF) 100 MCG/2ML IJ SOLN
INTRAMUSCULAR | Status: DC | PRN
  Administered 2021-03-26: 50 ug via INTRAVENOUS

## 2021-03-26 MED ORDER — PHENYLEPHRINE 40 MCG/ML (10ML) SYRINGE FOR IV PUSH (FOR BLOOD PRESSURE SUPPORT)
PREFILLED_SYRINGE | INTRAVENOUS | Status: DC | PRN
  Administered 2021-03-26: 80 ug via INTRAVENOUS
  Administered 2021-03-26: 40 ug via INTRAVENOUS

## 2021-03-26 MED ORDER — 0.9 % SODIUM CHLORIDE (POUR BTL) OPTIME
TOPICAL | Status: DC | PRN
  Administered 2021-03-26: 1000 mL

## 2021-03-26 MED ORDER — METHYLENE BLUE 1 % INJ SOLN
INTRAMUSCULAR | Status: DC | PRN
  Administered 2021-03-26: 1 mL

## 2021-03-26 MED ORDER — ACETAMINOPHEN 500 MG PO TABS: 1000.0000 mg | ORAL_TABLET | ORAL | Status: DC

## 2021-03-26 MED ORDER — ONDANSETRON HCL 4 MG/2ML IJ SOLN
INTRAMUSCULAR | Status: DC | PRN
  Administered 2021-03-26: 4 mg via INTRAVENOUS

## 2021-03-26 MED ORDER — EPHEDRINE 5 MG/ML INJ
INTRAVENOUS | Status: AC
  Filled 2021-03-26: qty 10

## 2021-03-26 MED ORDER — TECHNETIUM TC 99M TILMANOCEPT KIT
1.0000 | PACK | Freq: Once | INTRAVENOUS | Status: AC | PRN
  Administered 2021-03-26: 1 via INTRADERMAL

## 2021-03-26 MED ORDER — FENTANYL CITRATE (PF) 250 MCG/5ML IJ SOLN
INTRAMUSCULAR | Status: AC
  Filled 2021-03-26: qty 5

## 2021-03-26 MED ORDER — FENTANYL CITRATE (PF) 100 MCG/2ML IJ SOLN
25.0000 ug | INTRAMUSCULAR | Status: DC | PRN
  Administered 2021-03-26: 50 ug via INTRAVENOUS

## 2021-03-26 MED ORDER — DEXAMETHASONE SODIUM PHOSPHATE 10 MG/ML IJ SOLN
INTRAMUSCULAR | Status: DC | PRN
  Administered 2021-03-26: 5 mg via INTRAVENOUS

## 2021-03-26 MED ORDER — CIPROFLOXACIN IN D5W 400 MG/200ML IV SOLN
400.0000 mg | INTRAVENOUS | Status: DC
  Filled 2021-03-26: qty 200

## 2021-03-26 MED ORDER — LIDOCAINE HCL (PF) 2 % IJ SOLN
INTRAMUSCULAR | Status: DC | PRN
  Administered 2021-03-26: 60 mg via INTRADERMAL

## 2021-03-26 MED ORDER — EPHEDRINE SULFATE 50 MG/ML IJ SOLN
INTRAMUSCULAR | Status: DC | PRN
  Administered 2021-03-26 (×2): 5 mg via INTRAVENOUS

## 2021-03-26 MED ORDER — OXYCODONE HCL 5 MG PO TABS
5.0000 mg | ORAL_TABLET | Freq: Four times a day (QID) | ORAL | 0 refills | Status: DC | PRN
Start: 2021-03-26 — End: 2021-04-09

## 2021-03-26 MED ORDER — ONDANSETRON HCL 4 MG/2ML IJ SOLN
INTRAMUSCULAR | Status: AC
  Filled 2021-03-26: qty 2

## 2021-03-26 MED ORDER — DEXAMETHASONE SODIUM PHOSPHATE 10 MG/ML IJ SOLN
INTRAMUSCULAR | Status: AC
  Filled 2021-03-26: qty 1

## 2021-03-26 MED ORDER — CIPROFLOXACIN IN D5W 400 MG/200ML IV SOLN
400.0000 mg | INTRAVENOUS | Status: AC
Start: 2021-03-27 — End: 2021-03-26
  Administered 2021-03-26: 400 mg via INTRAVENOUS
  Filled 2021-03-26: qty 200

## 2021-03-26 MED ORDER — OXYCODONE HCL 5 MG PO TABS: 5.0000 mg | ORAL_TABLET | Freq: Four times a day (QID) | ORAL | 0 refills | Status: DC | PRN

## 2021-03-26 MED ORDER — CHLORHEXIDINE GLUCONATE CLOTH 2 % EX PADS
6.0000 | MEDICATED_PAD | Freq: Once | CUTANEOUS | Status: DC
Start: 2021-03-26 — End: 2021-03-27

## 2021-03-26 MED ORDER — LIDOCAINE HCL (PF) 1 % IJ SOLN
INTRAMUSCULAR | Status: AC
  Filled 2021-03-26: qty 30

## 2021-03-26 MED ORDER — LIDOCAINE HCL 1 % IJ SOLN
INTRAMUSCULAR | Status: DC | PRN
  Administered 2021-03-26: 7 mL via SUBCUTANEOUS
  Administered 2021-03-26: 3 mL via SUBCUTANEOUS

## 2021-03-26 MED ORDER — PROPOFOL 10 MG/ML IV BOLUS
INTRAVENOUS | Status: DC | PRN
  Administered 2021-03-26: 170 mg via INTRAVENOUS

## 2021-03-26 MED ORDER — PROMETHAZINE HCL 25 MG/ML IJ SOLN
6.2500 mg | INTRAMUSCULAR | Status: DC | PRN
  Administered 2021-03-26: 6.25 mg via INTRAVENOUS

## 2021-03-26 MED ORDER — OXYCODONE HCL 5 MG PO TABS
5.0000 mg | ORAL_TABLET | Freq: Once | ORAL | Status: DC | PRN
Start: 2021-03-26 — End: 2021-03-27

## 2021-03-26 MED ORDER — OXYCODONE HCL 5 MG/5ML PO SOLN: 5.0000 mg | Freq: Once | ORAL | Status: DC | PRN

## 2021-03-26 MED ORDER — PROPOFOL 10 MG/ML IV BOLUS
INTRAVENOUS | Status: AC
  Filled 2021-03-26: qty 20

## 2021-03-26 MED ORDER — PHENYLEPHRINE 40 MCG/ML (10ML) SYRINGE FOR IV PUSH (FOR BLOOD PRESSURE SUPPORT)
PREFILLED_SYRINGE | INTRAVENOUS | Status: AC
  Filled 2021-03-26: qty 10

## 2021-03-26 MED ORDER — SCOPOLAMINE 1 MG/3DAYS TD PT72
1.0000 | MEDICATED_PATCH | TRANSDERMAL | Status: DC
  Administered 2021-03-26: 1.5 mg via TRANSDERMAL
  Filled 2021-03-26: qty 1

## 2021-03-26 MED ORDER — APREPITANT 40 MG PO CAPS
40.0000 mg | ORAL_CAPSULE | Freq: Once | ORAL | Status: AC
  Administered 2021-03-26: 40 mg via ORAL
  Filled 2021-03-26: qty 1

## 2021-03-26 MED ORDER — PROMETHAZINE HCL 25 MG/ML IJ SOLN
INTRAMUSCULAR | Status: AC
  Filled 2021-03-26: qty 1

## 2021-03-26 MED ORDER — FENTANYL CITRATE (PF) 100 MCG/2ML IJ SOLN
INTRAMUSCULAR | Status: AC
  Filled 2021-03-26: qty 2

## 2021-03-26 MED ORDER — LACTATED RINGERS IV SOLN: INTRAVENOUS | Status: DC

## 2021-03-26 MED ORDER — METHYLENE BLUE 0.5 % INJ SOLN
INTRAVENOUS | Status: AC
  Filled 2021-03-26: qty 10

## 2021-03-26 MED ORDER — ORAL CARE MOUTH RINSE: 15.0000 mL | Freq: Once | OROMUCOSAL | Status: AC

## 2021-03-26 MED ORDER — LIDOCAINE HCL (PF) 2 % IJ SOLN
INTRAMUSCULAR | Status: AC
  Filled 2021-03-26: qty 5

## 2021-03-26 SURGICAL SUPPLY — 62 items
ADH SKN CLS APL DERMABOND .7 (GAUZE/BANDAGES/DRESSINGS) ×1
APL PRP STRL LF DISP 70% ISPRP (MISCELLANEOUS) ×4
BLADE SURG 10 STRL SS (BLADE) ×2 IMPLANT
BNDG COHESIVE 4X5 TAN STRL (GAUZE/BANDAGES/DRESSINGS) ×1 IMPLANT
BNDG GAUZE ELAST 4 BULKY (GAUZE/BANDAGES/DRESSINGS) ×2 IMPLANT
CANISTER SUCT 3000ML PPV (MISCELLANEOUS) ×2 IMPLANT
CHLORAPREP W/TINT 26 (MISCELLANEOUS) ×5 IMPLANT
CLIP VESOCCLUDE MED 24/CT (CLIP) ×2 IMPLANT
CLIP VESOCCLUDE SM WIDE 24/CT (CLIP) ×2 IMPLANT
CNTNR URN SCR LID CUP LEK RST (MISCELLANEOUS) ×3 IMPLANT
CONT SPEC 4OZ STRL OR WHT (MISCELLANEOUS) ×6
COVER MAYO STAND STRL (DRAPES) IMPLANT
COVER PROBE W GEL 5X96 (DRAPES) ×2 IMPLANT
COVER SURGICAL LIGHT HANDLE (MISCELLANEOUS) ×2 IMPLANT
COVER WAND RF STERILE (DRAPES) ×2 IMPLANT
DECANTER SPIKE VIAL GLASS SM (MISCELLANEOUS) ×1 IMPLANT
DERMABOND ADVANCED (GAUZE/BANDAGES/DRESSINGS) ×1
DERMABOND ADVANCED .7 DNX12 (GAUZE/BANDAGES/DRESSINGS) IMPLANT
DRAPE HALF SHEET 40X57 (DRAPES) ×2 IMPLANT
DRAPE LAPAROSCOPIC ABDOMINAL (DRAPES) ×2 IMPLANT
DRSG HYDROCOLLOID 4X4 (GAUZE/BANDAGES/DRESSINGS) ×1 IMPLANT
DRSG TEGADERM 4X4.75 (GAUZE/BANDAGES/DRESSINGS) ×2 IMPLANT
ELECT REM PT RETURN 9FT ADLT (ELECTROSURGICAL) ×2
ELECTRODE REM PT RTRN 9FT ADLT (ELECTROSURGICAL) ×1 IMPLANT
GAUZE SPONGE 2X2 8PLY STRL LF (GAUZE/BANDAGES/DRESSINGS) ×1 IMPLANT
GAUZE SPONGE 4X4 12PLY STRL (GAUZE/BANDAGES/DRESSINGS) ×1 IMPLANT
GLOVE BIO SURGEON STRL SZ 6 (GLOVE) ×2 IMPLANT
GLOVE SURG UNDER LTX SZ6.5 (GLOVE) ×2 IMPLANT
GOWN STRL REUS W/ TWL LRG LVL3 (GOWN DISPOSABLE) ×2 IMPLANT
GOWN STRL REUS W/TWL 2XL LVL3 (GOWN DISPOSABLE) ×4 IMPLANT
GOWN STRL REUS W/TWL LRG LVL3 (GOWN DISPOSABLE) ×4
KIT BASIN OR (CUSTOM PROCEDURE TRAY) ×2 IMPLANT
KIT TURNOVER KIT B (KITS) ×2 IMPLANT
MARKER SKIN DUAL TIP RULER LAB (MISCELLANEOUS) ×2 IMPLANT
NDL 18GX1X1/2 (RX/OR ONLY) (NEEDLE) ×1 IMPLANT
NDL FILTER BLUNT 18X1 1/2 (NEEDLE) IMPLANT
NDL HYPO 25GX1X1/2 BEV (NEEDLE) ×1 IMPLANT
NEEDLE 18GX1X1/2 (RX/OR ONLY) (NEEDLE) ×2 IMPLANT
NEEDLE 22X1 1/2 (OR ONLY) (NEEDLE) ×2 IMPLANT
NEEDLE FILTER BLUNT 18X 1/2SAF (NEEDLE)
NEEDLE FILTER BLUNT 18X1 1/2 (NEEDLE) IMPLANT
NEEDLE HYPO 25GX1X1/2 BEV (NEEDLE) ×2 IMPLANT
NS IRRIG 1000ML POUR BTL (IV SOLUTION) ×2 IMPLANT
PACK GENERAL/GYN (CUSTOM PROCEDURE TRAY) ×2 IMPLANT
PACK UNIVERSAL I (CUSTOM PROCEDURE TRAY) IMPLANT
PAD ARMBOARD 7.5X6 YLW CONV (MISCELLANEOUS) ×4 IMPLANT
PENCIL SMOKE EVACUATOR (MISCELLANEOUS) ×2 IMPLANT
SPECIMEN JAR MEDIUM (MISCELLANEOUS) ×2 IMPLANT
SPONGE GAUZE 2X2 STER 10/PKG (GAUZE/BANDAGES/DRESSINGS) ×1
STOCKINETTE IMPERVIOUS 9X36 MD (GAUZE/BANDAGES/DRESSINGS) ×2 IMPLANT
STRIP CLOSURE SKIN 1/2X4 (GAUZE/BANDAGES/DRESSINGS) ×2 IMPLANT
SUT ETHILON 2 0 FS 18 (SUTURE) ×1 IMPLANT
SUT MNCRL AB 3-0 PS2 18 (SUTURE) ×2 IMPLANT
SUT MNCRL AB 4-0 PS2 18 (SUTURE) ×2 IMPLANT
SUT SILK 2 0 PERMA HAND 18 BK (SUTURE) ×2 IMPLANT
SUT VIC AB 2-0 SH 27 (SUTURE) ×2
SUT VIC AB 2-0 SH 27XBRD (SUTURE) ×2 IMPLANT
SUT VIC AB 3-0 SH 27 (SUTURE) ×2
SUT VIC AB 3-0 SH 27X BRD (SUTURE) ×2 IMPLANT
SYR CONTROL 10ML LL (SYRINGE) ×4 IMPLANT
TOWEL GREEN STERILE (TOWEL DISPOSABLE) ×2 IMPLANT
TOWEL GREEN STERILE FF (TOWEL DISPOSABLE) ×2 IMPLANT

## 2021-03-26 NOTE — Progress Notes (Signed)
Orthopedic Tech Progress Note Patient Details:  Destiny [name] 09-24-1946 [id number]  Ortho Devices Type of Ortho Device: Postop shoe/boot Ortho Device/Splint Location: LLE Ortho Device/Splint Interventions: Ordered, Application, Adjustment   Post Interventions Patient Tolerated: Well Instructions Provided: Care of device  [name] 03/26/2021, 7:08 PM

## 2021-03-26 NOTE — Transfer of Care (Signed)
Immediate Anesthesia Transfer of Care Note  Patient: Destiny [name]  Procedure(s) Performed: WIDE LOCAL EXCISION MELANOMA LEFT HEEL, SENTINEL LYMPH NODE MAPPING AND BIOPSY (Left: Heel)  Patient Location: PACU  Anesthesia Type:General  Level of Consciousness: awake  Airway & Oxygen Therapy: Patient Spontanous Breathing and Patient connected to face mask oxygen  Post-op Assessment: Report given to RN and Post -op Vital signs reviewed and stable  Post vital signs: Reviewed and stable  Last Vitals:  Vitals Value Taken Time  BP 146/82 03/26/21 1718  Temp    Pulse 73 03/26/21 1721  Resp 17 03/26/21 1721  SpO2 97 % 03/26/21 1721  Vitals shown include unvalidated device data.  Last Pain:  Vitals:   03/26/21 1331  TempSrc:   PainSc: 0-No pain         Complications: No notable events documented.

## 2021-03-26 NOTE — Discharge Instructions (Addendum)
[place] Office Phone Number [phone]   POST OP INSTRUCTIONS  Always review your discharge instruction sheet given to you by the facility where your surgery was performed.  IF YOU HAVE DISABILITY OR FAMILY LEAVE FORMS, YOU MUST BRING THEM TO THE OFFICE FOR PROCESSING.  DO NOT GIVE THEM TO YOUR DOCTOR.  A prescription for pain medication may be given to you upon discharge.  Take your pain medication as prescribed, if needed.  If narcotic pain medicine is not needed, then you may take acetaminophen (Tylenol) or ibuprofen (Advil) as needed. Take your usually prescribed medications unless otherwise directed If you need a refill on your pain medication, please contact your pharmacy.  They will contact our office to request authorization.  Prescriptions will not be filled after 5pm or on week-ends. You should eat very light the first 24 hours after surgery, such as soup, crackers, pudding, etc.  Resume your normal diet the day after surgery It is common to experience some constipation if taking pain medication after surgery.  Increasing fluid intake and taking a stool softener will usually help or prevent this problem from occurring.  A mild laxative (Milk of Magnesia or Miralax) should be taken according to package directions if there are no bowel movements after 48 hours. You may shower in 48 hours.  The surgical glue will flake off in 2-3 weeks. Change the outer dressing every 1-2 days with dry gauze and ace wrap. Wear post op shoe.    ACTIVITIES:  No strenuous activity or heavy lifting for 1 week.   You may drive when you no longer are taking prescription pain medication, you can comfortably wear a seatbelt, and you can safely maneuver your car and apply brakes. RETURN TO WORK:  __________n/a_______________ Dennis Bast should see your doctor in the office for a follow-up appointment approximately three-four weeks after your surgery.    WHEN TO CALL YOUR DOCTOR: Fever over  101.0 Nausea and/or vomiting. Extreme swelling or bruising. Continued bleeding from incision. Increased pain, redness, or drainage from the incision.  The clinic staff is available to answer your questions during regular business hours.  Please don't hesitate to call and ask to speak to one of the nurses for clinical concerns.  If you have a medical emergency, go to the nearest emergency room or call 911.  A surgeon from [facility] is always on call at the hospital.  For further questions, please visit [email or web address]

## 2021-03-26 NOTE — Interval H&P Note (Signed)
History and Physical Interval Note:  03/26/2021 1:36 PM  Destiny [name]  has presented today for surgery, with the diagnosis of LEFT HEEL MELANOMA.  The various methods of treatment have been discussed with the patient and family. After consideration of risks, benefits and other options for treatment, the patient has consented to  Procedure(s): WIDE LOCAL EXCISION MELANOMA LEFT HEEL, SENTINEL LYMPH NODE MAPPING AND BIOPSY (Left) as a surgical intervention.  The patient's history has been reviewed, patient examined, no change in status, stable for surgery.  I have reviewed the patient's chart and labs.  Questions were answered to the patient's satisfaction.     [name]

## 2021-03-26 NOTE — Anesthesia Procedure Notes (Signed)
Procedure Name: LMA Insertion Date/Time: 03/26/2021 3:33 PM Performed by: Kathryne Hitch, CRNA Pre-anesthesia Checklist: Patient identified, Emergency Drugs available, Suction available and Patient being monitored Patient Re-evaluated:Patient Re-evaluated prior to induction Oxygen Delivery Method: Circle system utilized Preoxygenation: Pre-oxygenation with 100% oxygen Induction Type: IV induction Ventilation: Mask ventilation without difficulty LMA: LMA inserted LMA Size: 4.0 Number of attempts: 1 Placement Confirmation: positive ETCO2 and breath sounds checked- equal and bilateral Tube secured with: Tape Dental Injury: Teeth and Oropharynx as per pre-operative assessment

## 2021-03-26 NOTE — Op Note (Signed)
PRE-OPERATIVE DIAGNOSIS: cT3bN0 left heel melanoma  POST-OPERATIVE DIAGNOSIS:  Same  PROCEDURE:  Procedure(s): Wide local excision 2 cm margins, temporary closure for defect 3.9 x 4.1 cm, left inguinal sentinel lymph node mapping and biopsy  SURGEON:  Surgeon(s): [name], MD  ASSIST:  Ewell Poe, RNFA  ANESTHESIA:   local and general  DRAINS: none   LOCAL MEDICATIONS USED:  MARCAINE    and XYLOCAINE   SPECIMEN:  Source of Specimen:  two deep left inguinal sentinel lymph nodes, wide local excision left heel melanoma   FINDINGS:  no gross residual disease. SLN #1 cps 1410, SLN #2 cps 150, background 90  DISPOSITION OF SPECIMEN:  PATHOLOGY  COUNTS:  YES  PLAN OF CARE: Discharge to home after PACU  PATIENT DISPOSITION:  PACU - hemodynamically stable.    PROCEDURE:   Pt was identified in the holding area, taken to the OR, and placed supine on the OR table.  General anesthesia was induced.  Time out was performed according to the surgical safety checklist.  When all was correct, we continued.  One mL methylene blue was injected intradermally around the melanoma biopsy site.    The patient was placed into the supine position.  The left lower extremity and groin were prepped and draped in sterile fashion.   The point of maximum signal intensity was identified with the neoprobe in the groin.  A 4 cm incision was made with a #15 blade.  The subcutaneous tissues were divided with the cautery.  A Weitlaner retractor was used to assist with visualization.  The tonsil clamp was used to bluntly dissect the fat pad.  Two deep inguinal sentinel lymph nodes were identified as described above.  The lymphovascular channels were clipped with hemoclips.  The nodes were passed off as specimens.  Hemostasis was achieved with the cautery.  The axilla was irrigated and closed with 3-0 Vicryl deep dermal interrupted sutures and 4-0 Monocryl running subcuticular suture.  The axilla was dressed with  dermabond.     The melanoma was identified and 2 cm margins were marked out.  Local was administered under the melanoma and the adjacent tissue.  A #10 blade was used to incise the skin around the melanoma.  The cautery was used to take the dissection down to the fascia.  The skin was marked in situ with orientation sutures.  The cautery was used to take the specimen off the fascia, and it was passed off the table.    Duoderm was cut to the appropriate size and was placed on the left heel wound.  This was secured with interrupted 3-0 monocryl sutures.    Needle, sponge, and instrument counts were correct.  The patient was awakened from anesthesia and taken to the PACU in stable condition.

## 2021-03-27 ENCOUNTER — Encounter (HOSPITAL_COMMUNITY): Payer: Self-pay | Admitting: General Surgery

## 2021-03-27 NOTE — Anesthesia Postprocedure Evaluation (Signed)
Anesthesia Post Note  Patient: Destiny [name]  Procedure(s) Performed: WIDE LOCAL EXCISION MELANOMA LEFT HEEL, SENTINEL LYMPH NODE MAPPING AND BIOPSY (Left: Heel)     Patient location during evaluation: PACU Anesthesia Type: General Level of consciousness: awake Pain management: pain level controlled Vital Signs Assessment: post-procedure vital signs reviewed and stable Respiratory status: spontaneous breathing Cardiovascular status: stable Postop Assessment: no apparent nausea or vomiting Anesthetic complications: no   No notable events documented.  Last Vitals:  Vitals:   03/26/21 1820 03/26/21 1850  BP: 115/62 119/82  Pulse: 66 64  Resp: 12 14  Temp:    SpO2: 92% 93%    Last Pain:  Vitals:   03/26/21 1850  TempSrc:   PainSc: 3                  Daryus Sowash

## 2021-03-28 ENCOUNTER — Telehealth: Payer: Self-pay | Admitting: *Deleted

## 2021-03-28 NOTE — Telephone Encounter (Signed)
Daryl w/ [employer] is calling to request biopsy results and final diagnosis , cpt code, precedural code. Please call([phone]-ref# [id number]).

## 2021-04-01 ENCOUNTER — Telehealth: Payer: Self-pay | Admitting: *Deleted

## 2021-04-03 LAB — SURGICAL PATHOLOGY

## 2021-04-04 DIAGNOSIS — C4372 Malignant melanoma of left lower limb, including hip: Secondary | ICD-10-CM | POA: Diagnosis not present

## 2021-04-05 ENCOUNTER — Telehealth: Payer: Self-pay | Admitting: General Surgery

## 2021-04-05 NOTE — Telephone Encounter (Signed)
Discussed pathology with Destiny [name].  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  Will hopefully be able to do this in conjunction with Dr. Marla Roe.

## 2021-04-09 ENCOUNTER — Other Ambulatory Visit: Payer: Self-pay

## 2021-04-09 ENCOUNTER — Encounter: Payer: Self-pay | Admitting: Plastic Surgery

## 2021-04-09 ENCOUNTER — Ambulatory Visit (INDEPENDENT_AMBULATORY_CARE_PROVIDER_SITE_OTHER): Payer: PPO | Admitting: Plastic Surgery

## 2021-04-09 DIAGNOSIS — C439 Malignant melanoma of skin, unspecified: Secondary | ICD-10-CM

## 2021-04-09 NOTE — Progress Notes (Signed)
Patient ID: Destiny [name], female    DOB: 1946-08-19, 75 y.o.   MRN: [id number]   Chief Complaint  Patient presents with   Advice Only   Skin Problem    The patient is a 75 year old female here for evaluation of her left foot.  She was being seen by podiatry for a wound on the left foot.  It did not seem to be healing.  Finally a biopsy was done and she was found to have melanoma.  She underwent excision 6/14 by Dr. Barry Dienes with 2 cm margins and the resulting defect that was approximately 4 x 4 cm.  She had inguinal sentinel lymph node biopsy as well.  The pathology showed 2 negative sentinel lymph nodes.  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  The wound is approximately 4 x 4 cm.  It is clean and does not appear to be infected.  I removed the DuoDERM.   Review of Systems  Constitutional: Negative.   HENT: Negative.    Eyes: Negative.   Respiratory: Negative.    Cardiovascular: Negative.   Gastrointestinal: Negative.   Endocrine: Negative.   Genitourinary: Negative.   Neurological: Negative.   Hematological: Negative.    Past Medical History:  Diagnosis Date   Allergy    Ankle dislocation, left, initial encounter 09/23/2017   Arthritis    Blood transfusion    in 1980   Cancer ([place])    left heel melanoma   Diabetes mellitus    Hypothyroidism    Osteoporosis    osteopenia   PONV (postoperative nausea and vomiting)    Sleep apnea    does not use cpap   Thyroid disease    hypothyroidism    Past Surgical History:  Procedure Laterality Date   COLONOSCOPY     EXCISION MELANOMA WITH SENTINEL LYMPH NODE BIOPSY Left 03/26/2021   Procedure: WIDE LOCAL EXCISION MELANOMA LEFT HEEL, SENTINEL LYMPH NODE MAPPING AND BIOPSY;  Surgeon: [name], MD;  Location: [facility];  Service: General;  Laterality: Left;   EXTERNAL FIXATION LEG Left 09/24/2017   Procedure: EXTERNAL FIXATION ankle;  Surgeon:  Shona Needles, MD;  Location: [facility];  Service: Orthopedics;  Laterality: Left;   EXTERNAL FIXATION REMOVAL Left 09/28/2017   Procedure: REMOVAL EXTERNAL FIXATION LEG;  Surgeon: Shona Needles, MD;  Location: [facility];  Service: Orthopedics;  Laterality: Left;   EYE SURGERY Bilateral    cataract   LAPAROSCOPIC GASTRIC BANDING     OPEN REDUCTION INTERNAL FIXATION (ORIF) DISTAL RADIAL FRACTURE Right 07/29/2016   Procedure: RIGHT OPEN REDUCTION INTERNAL FIXATION (ORIF) DISTAL RADIAL FRACTURE;  Surgeon: Leanora Cover, MD;  Location: [facility];  Service: Orthopedics;  Laterality: Right;   ORIF ANKLE FRACTURE Left 09/28/2017   Procedure: OPEN REDUCTION INTERNAL FIXATION (ORIF) ANKLE FRACTURE;  Surgeon: Shona Needles, MD;  Location: [facility];  Service: Orthopedics;  Laterality: Left;   POLYPECTOMY     TOTAL ABDOMINAL HYSTERECTOMY W/ BILATERAL SALPINGOOPHORECTOMY     WISDOM TOOTH EXTRACTION        Current Outpatient Medications:    acetaminophen (TYLENOL) 500 MG tablet, Take 1,000 mg by mouth every 6 (six) hours as needed (pain)., Disp: , Rfl:    aspirin EC 81 MG tablet, Take 81 mg by mouth in the morning., Disp: , Rfl:    Cholecalciferol (VITAMIN D3) 50 MCG (2000 UT) TABS, Take 2,000  Units by mouth in the morning., Disp: , Rfl:    denosumab (PROLIA) 60 MG/ML SOSY injection, Inject 60 mg into the skin every 6 (six) months., Disp: , Rfl:    ezetimibe (ZETIA) 10 MG tablet, Take 10 mg by mouth in the morning., Disp: , Rfl:    levothyroxine (SYNTHROID) 100 MCG tablet, Take 100 mcg by mouth every morning., Disp: , Rfl:    metFORMIN (GLUCOPHAGE-XR) 500 MG 24 hr tablet, Take 1,000 mg by mouth 2 (two) times daily., Disp: , Rfl: 2   Multiple Vitamins-Minerals (OCUVITE EXTRA) TABS, Take 1 tablet by mouth in the morning and at bedtime., Disp: , Rfl:    OZEMPIC, 1 MG/DOSE, 4 MG/3ML SOPN, Inject 1 mg into the skin every Friday., Disp: , Rfl:    pravastatin (PRAVACHOL) 40 MG tablet, Take 40 mg by  mouth every evening., Disp: , Rfl:    vitamin B-12 (CYANOCOBALAMIN) 1000 MCG tablet, Take 1,000 mcg by mouth in the morning., Disp: , Rfl:    Objective:   Vitals:   04/09/21 1325  BP: 137/85  Pulse: 85  SpO2: 93%    Physical Exam Vitals and nursing note reviewed.  Constitutional:      Appearance: Normal appearance.  HENT:     Head: Normocephalic and atraumatic.  Cardiovascular:     Rate and Rhythm: Normal rate.     Pulses: Normal pulses.  Pulmonary:     Effort: Pulmonary effort is normal.  Musculoskeletal:        General: Swelling and deformity present.  Skin:    General: Skin is warm.     Capillary Refill: Capillary refill takes less than 2 seconds.     Coloration: Skin is not jaundiced.     Findings: Lesion present. No bruising.  Neurological:     General: No focal deficit present.     Mental Status: She is alert and oriented to person, place, and time.    Assessment & Plan:  Melanoma of skin ([place])  We will work with Dr. Barry Dienes to do the excision and reconstruction at the same time.  Depending on the way that it looks we will either do a skin graft or place acell and then a skin graft.  The patient would like the least amount of surgery as possible.  I discussed the above information with Dr. Barry Dienes.  Donated ACell was applied today.  Risks and complications of the surgery were discussed.  Pictures were obtained of the patient and placed in the chart with the patient's or guardian's permission.   [place], DO

## 2021-04-11 ENCOUNTER — Telehealth: Payer: Self-pay

## 2021-04-11 NOTE — Telephone Encounter (Signed)
Patient called to say that she is supposed to change her bandage today and she needs some instruction on what to do.  Please call.

## 2021-04-12 NOTE — Telephone Encounter (Signed)
Called and spoke with the patient on (04/11/21) regarding the message below.  Patient stated that she was trying to change the dressing on her foot, but the dressing was stuck on the wound.  So she was not sure what dressing to put back on the wound.    She stated that she was finally was able to get the old dressing off and she was then able to see how she was to redress the wound.//AB/CMA

## 2021-04-17 NOTE — Progress Notes (Signed)
Surgical Instructions    Your procedure is scheduled on 04/24/21.  Report to [facility] Main Entrance "A" at 1:00 P.M., then check in with the Admitting office.  Call this number if you have problems the morning of surgery:  [phone]   If you have any questions prior to your surgery date call [phone]: Open Monday-Friday 8am-4pm    Remember:  Do not eat after midnight the night before your surgery  You may drink clear liquids until 12:00 PM the morning of your surgery.   Clear liquids allowed are: Water, Non-Citrus Juices (without pulp), Carbonated Beverages, Clear Tea, Black Coffee Only, and Gatorade    Take these medicines the morning of surgery with A SIP OF WATER  acetaminophen (TYLENOL) if needed ezetimibe (ZETIA)  levothyroxine (SYNTHROID)     As of today, STOP taking any Aspirin (unless otherwise instructed by your surgeon) Aleve, Naproxen, Ibuprofen, Motrin, Advil, Goody's, BC's, all herbal medications, fish oil, and all vitamins.  WHAT DO I DO ABOUT MY DIABETES MEDICATION?   Do not take oral diabetes medicines (pills) the morning of surgery.      THE MORNING OF SURGERY, do not take metFORMIN (GLUCOPHAGE-XR) or OZEMPIC.  The day of surgery, do not take other diabetes injectables, including Byetta (exenatide), Bydureon (exenatide ER), Victoza (liraglutide), or Trulicity (dulaglutide).  If your CBG is greater than 220 mg/dL, you may take  of your sliding scale (correction) dose of insulin.   HOW TO MANAGE YOUR DIABETES BEFORE AND AFTER SURGERY  Why is it important to control my blood sugar before and after surgery? Improving blood sugar levels before and after surgery helps healing and can limit problems. A way of improving blood sugar control is eating a healthy diet by:  Eating less sugar and carbohydrates  Increasing activity/exercise  Talking with your doctor about reaching your blood sugar goals High blood sugars (greater than 180 mg/dL) can raise  your risk of infections and slow your recovery, so you will need to focus on controlling your diabetes during the weeks before surgery. Make sure that the doctor who takes care of your diabetes knows about your planned surgery including the date and location.  How do I manage my blood sugar before surgery? Check your blood sugar at least 4 times a day, starting 2 days before surgery, to make sure that the level is not too high or low.  Check your blood sugar the morning of your surgery when you wake up and every 2 hours until you get to the Short Stay unit.  If your blood sugar is less than 70 mg/dL, you will need to treat for low blood sugar: Do not take insulin. Treat a low blood sugar (less than 70 mg/dL) with  cup of clear juice (cranberry or apple), 4 glucose tablets, OR glucose gel. Recheck blood sugar in 15 minutes after treatment (to make sure it is greater than 70 mg/dL). If your blood sugar is not greater than 70 mg/dL on recheck, call [phone] for further instructions. Report your blood sugar to the short stay nurse when you get to Short Stay.  If you are admitted to the hospital after surgery: Your blood sugar will be checked by the staff and you will probably be given insulin after surgery (instead of oral diabetes medicines) to make sure you have good blood sugar levels. The goal for blood sugar control after surgery is 80-180 mg/dL.           Do not wear jewelry or  makeup Do not wear lotions, powders, perfumes/colognes, or deodorant. Do not shave 48 hours prior to surgery.  Men may shave face and neck. Do not bring valuables to the hospital.  DO Not wear nail polish, gel polish, artificial nails, or any other type of covering on natural nails  including finger and toenails. If patients have artificial nails, gel coating, etc. that need to be removed by a nail salon please have this removed prior to surgery or surgery may need to be canceled/delayed if the surgeon/  anesthesia feels like the patient is unable to be adequately monitored.             [facility] is not responsible for any belongings or valuables.  Do NOT Smoke (Tobacco/Vaping) or drink Alcohol 24 hours prior to your procedure If you use a CPAP at night, you may bring all equipment for your overnight stay.   Contacts, glasses, dentures or bridgework may not be worn into surgery, please bring cases for these belongings   For patients admitted to the hospital, discharge time will be determined by your treatment team.   Patients discharged the day of surgery will not be allowed to drive home, and someone needs to stay with them for 24 hours.  ONLY 1 SUPPORT PERSON MAY BE PRESENT WHILE YOU ARE IN SURGERY. IF YOU ARE TO BE ADMITTED ONCE YOU ARE IN YOUR ROOM YOU WILL BE ALLOWED TWO (2) VISITORS.  Minor children may have two parents present. Special consideration for safety and communication needs will be reviewed on a case by case basis.  Special instructions:    Oral Hygiene is also important to reduce your risk of infection.  Remember - BRUSH YOUR TEETH THE MORNING OF SURGERY WITH YOUR REGULAR TOOTHPASTE   [facility]- Preparing For Surgery  Before surgery, you can play an important role. Because skin is not sterile, your skin needs to be as free of germs as possible. You can reduce the number of germs on your skin by washing with CHG (chlorahexidine gluconate) Soap before surgery.  CHG is an antiseptic cleaner which kills germs and bonds with the skin to continue killing germs even after washing.     Please do not use if you have an allergy to CHG or antibacterial soaps. If your skin becomes reddened/irritated stop using the CHG.  Do not shave (including legs and underarms) for at least 48 hours prior to first CHG shower. It is OK to shave your face.  Please follow these instructions carefully.     Shower the NIGHT BEFORE SURGERY and the MORNING OF SURGERY with CHG Soap.   If you chose  to wash your hair, wash your hair first as usual with your normal shampoo. After you shampoo, rinse your hair and body thoroughly to remove the shampoo.  Then [employer] and genitals (private parts) with your normal soap and rinse thoroughly to remove soap.  After that Use CHG Soap as you would any other liquid soap. You can apply CHG directly to the skin and wash gently with a scrungie or a clean washcloth.   Apply the CHG Soap to your body ONLY FROM THE NECK DOWN.  Do not use on open wounds or open sores. Avoid contact with your eyes, ears, mouth and genitals (private parts). Wash Face and genitals (private parts)  with your normal soap.   Wash thoroughly, paying special attention to the area where your surgery will be performed.  Thoroughly rinse your body with warm water  from the neck down.  DO NOT shower/wash with your normal soap after using and rinsing off the CHG Soap.  Pat yourself dry with a CLEAN TOWEL.  Wear CLEAN PAJAMAS to bed the night before surgery  Place CLEAN SHEETS on your bed the night before your surgery  DO NOT SLEEP WITH PETS.   Day of Surgery: Take a shower with CHG soap. Wear Clean/Comfortable clothing the morning of surgery Do not apply any deodorants/lotions.   Remember to brush your teeth WITH YOUR REGULAR TOOTHPASTE.   Please read over the following fact sheets that you were given.

## 2021-04-18 ENCOUNTER — Encounter (HOSPITAL_COMMUNITY)
Admission: RE | Admit: 2021-04-18 | Discharge: 2021-04-18 | Disposition: A | Discharge status: Home / Self Care | Payer: PPO | Source: Ambulatory Visit | Attending: General Surgery | Admitting: General Surgery

## 2021-04-18 ENCOUNTER — Other Ambulatory Visit: Payer: Self-pay | Admitting: General Surgery

## 2021-04-18 ENCOUNTER — Encounter (HOSPITAL_COMMUNITY): Payer: Self-pay

## 2021-04-18 ENCOUNTER — Other Ambulatory Visit: Payer: Self-pay

## 2021-04-18 DIAGNOSIS — Z79899 Other long term (current) drug therapy: Secondary | ICD-10-CM | POA: Insufficient documentation

## 2021-04-18 DIAGNOSIS — Z01812 Encounter for preprocedural laboratory examination: Secondary | ICD-10-CM | POA: Insufficient documentation

## 2021-04-18 DIAGNOSIS — Z87891 Personal history of nicotine dependence: Secondary | ICD-10-CM | POA: Diagnosis not present

## 2021-04-18 DIAGNOSIS — C439 Malignant melanoma of skin, unspecified: Secondary | ICD-10-CM | POA: Diagnosis not present

## 2021-04-18 HISTORY — DX: Personal history of urinary calculi: Z87.442

## 2021-04-18 LAB — GLUCOSE, CAPILLARY: Glucose-Capillary: 113 mg/dL — ABNORMAL HIGH (ref 70–99)

## 2021-04-18 LAB — HEMOGLOBIN A1C
Hgb A1c MFr Bld: 5.9 % — ABNORMAL HIGH (ref 4.8–5.6)
Mean Plasma Glucose: 122.63 mg/dL

## 2021-04-18 NOTE — Progress Notes (Signed)
PCP - Deland Pretty, MD Cardiologist - denies  PPM/ICD - denies Device Orders - N/A Rep Notified - N/A  Chest x-ray - N/A EKG - 03/26/2021 Stress Test - more than 5 years per patient ECHO - denies Cardiac Cath - denies  Sleep Study - yes, positive CPAP - pt is not wearing  Fasting Blood Sugar - 124-130 Checks Blood Sugar 2-3 times/week CBG today - 113 A1C - done in PAT on 04/18/2021  Blood Thinner Instructions: N/A Aspirin Instructions: patient stopped taking Aspirin more than 2 weeks ago, before the previous surgery  ERAS Protcol - yes PRE-SURGERY  G2- yes  COVID TEST- ambulatory surgery   Anesthesia review: No  Patient denies shortness of breath, fever, cough and chest pain at PAT appointment   All instructions explained to the patient, with a verbal understanding of the material. Patient agrees to go over the instructions while at home for a better understanding. Patient also instructed to self quarantine after being tested for COVID-19. The opportunity to ask questions was provided.

## 2021-04-18 NOTE — Progress Notes (Signed)
Surgical Instructions    Your procedure is scheduled on 04/24/21.  Report to [facility] Main Entrance "A" at 1:00 P.M., then check in with the Admitting office.  Call this number if you have problems the morning of surgery:  [phone]   If you have any questions prior to your surgery date call [phone]: Open Monday-Friday 8am-4pm    Remember:  Do not eat after midnight the night before your surgery  You may drink clear liquids until 12:00 PM the morning of your surgery.   Clear liquids allowed are: Water, Non-Citrus Juices (without pulp), Carbonated Beverages, Clear Tea, Black Coffee Only, and Gatorade  Patient Instructions   The day of surgery (if you have diabetes): Drink ONE (1) 12 oz G2 given to you in your pre admission testing appointment by 12:00 the day of surgery. Drink in one sitting. Do not sip.  This drink was given to you during your hospital  pre-op appointment visit.  Nothing else to drink after completing the  12 oz bottle of G2.         If you have questions, please contact your surgeon's office.     Take these medicines the morning of surgery with A SIP OF WATER   acetaminophen (TYLENOL) if needed ezetimibe (ZETIA)  levothyroxine (SYNTHROID)     As of today, STOP taking any Aspirin (unless otherwise instructed by your surgeon) Aleve, Naproxen, Ibuprofen, Motrin, Advil, Goody's, BC's, all herbal medications, fish oil, and all vitamins.  WHAT DO I DO ABOUT MY DIABETES MEDICATION?   Do not take oral diabetes medicines (pills) the morning of surgery.      THE MORNING OF SURGERY, do not take metFORMIN (GLUCOPHAGE-XR) or OZEMPIC.  The day of surgery, do not take other diabetes injectables, including Byetta (exenatide), Bydureon (exenatide ER), Victoza (liraglutide), or Trulicity (dulaglutide).  If your CBG is greater than 220 mg/dL, you may take  of your sliding scale (correction) dose of insulin.   HOW TO MANAGE YOUR DIABETES BEFORE AND AFTER  SURGERY  Why is it important to control my blood sugar before and after surgery? Improving blood sugar levels before and after surgery helps healing and can limit problems. A way of improving blood sugar control is eating a healthy diet by:  Eating less sugar and carbohydrates  Increasing activity/exercise  Talking with your doctor about reaching your blood sugar goals High blood sugars (greater than 180 mg/dL) can raise your risk of infections and slow your recovery, so you will need to focus on controlling your diabetes during the weeks before surgery. Make sure that the doctor who takes care of your diabetes knows about your planned surgery including the date and location.  How do I manage my blood sugar before surgery? Check your blood sugar at least 4 times a day, starting 2 days before surgery, to make sure that the level is not too high or low.  Check your blood sugar the morning of your surgery when you wake up and every 2 hours until you get to the Short Stay unit.  If your blood sugar is less than 70 mg/dL, you will need to treat for low blood sugar: Do not take insulin. Treat a low blood sugar (less than 70 mg/dL) with  cup of clear juice (cranberry or apple), 4 glucose tablets, OR glucose gel. Recheck blood sugar in 15 minutes after treatment (to make sure it is greater than 70 mg/dL). If your blood sugar is not greater than 70 mg/dL on recheck,  call [phone] for further instructions. Report your blood sugar to the short stay nurse when you get to Short Stay.  If you are admitted to the hospital after surgery: Your blood sugar will be checked by the staff and you will probably be given insulin after surgery (instead of oral diabetes medicines) to make sure you have good blood sugar levels. The goal for blood sugar control after surgery is 80-180 mg/dL.           Do not wear jewelry or makeup Do not wear lotions, powders, perfumes/colognes, or deodorant. Do not shave 48  hours prior to surgery.  Men may shave face and neck. Do not bring valuables to the hospital.  DO Not wear nail polish, gel polish, artificial nails, or any other type of covering on natural nails  including finger and toenails. If patients have artificial nails, gel coating, etc. that need to be removed by a nail salon please have this removed prior to surgery or surgery may need to be canceled/delayed if the surgeon/ anesthesia feels like the patient is unable to be adequately monitored.             [facility] is not responsible for any belongings or valuables.  Do NOT Smoke (Tobacco/Vaping) or drink Alcohol 24 hours prior to your procedure If you use a CPAP at night, you may bring all equipment for your overnight stay.   Contacts, glasses, dentures or bridgework may not be worn into surgery, please bring cases for these belongings   For patients admitted to the hospital, discharge time will be determined by your treatment team.   Patients discharged the day of surgery will not be allowed to drive home, and someone needs to stay with them for 24 hours.  ONLY 1 SUPPORT PERSON MAY BE PRESENT WHILE YOU ARE IN SURGERY. IF YOU ARE TO BE ADMITTED ONCE YOU ARE IN YOUR ROOM YOU WILL BE ALLOWED TWO (2) VISITORS.  Minor children may have two parents present. Special consideration for safety and communication needs will be reviewed on a case by case basis.  Special instructions:    Oral Hygiene is also important to reduce your risk of infection.  Remember - BRUSH YOUR TEETH THE MORNING OF SURGERY WITH YOUR REGULAR TOOTHPASTE   - Preparing For Surgery  Before surgery, you can play an important role. Because skin is not sterile, your skin needs to be as free of germs as possible. You can reduce the number of germs on your skin by washing with CHG (chlorahexidine gluconate) Soap before surgery.  CHG is an antiseptic cleaner which kills germs and bonds with the skin to continue killing  germs even after washing.     Please do not use if you have an allergy to CHG or antibacterial soaps. If your skin becomes reddened/irritated stop using the CHG.  Do not shave (including legs and underarms) for at least 48 hours prior to first CHG shower. It is OK to shave your face.  Please follow these instructions carefully.     Shower the NIGHT BEFORE SURGERY and the MORNING OF SURGERY with CHG Soap.   If you chose to wash your hair, wash your hair first as usual with your normal shampoo. After you shampoo, rinse your hair and body thoroughly to remove the shampoo.  Then [employer] and genitals (private parts) with your normal soap and rinse thoroughly to remove soap.  After that Use CHG Soap as you would any other liquid soap.  You can apply CHG directly to the skin and wash gently with a scrungie or a clean washcloth.   Apply the CHG Soap to your body ONLY FROM THE NECK DOWN.  Do not use on open wounds or open sores. Avoid contact with your eyes, ears, mouth and genitals (private parts). Wash Face and genitals (private parts)  with your normal soap.   Wash thoroughly, paying special attention to the area where your surgery will be performed.  Thoroughly rinse your body with warm water from the neck down.  DO NOT shower/wash with your normal soap after using and rinsing off the CHG Soap.  Pat yourself dry with a CLEAN TOWEL.  Wear CLEAN PAJAMAS to bed the night before surgery  Place CLEAN SHEETS on your bed the night before your surgery  DO NOT SLEEP WITH PETS.   Day of Surgery: Take a shower with CHG soap. Wear Clean/Comfortable clothing the morning of surgery Do not apply any deodorants/lotions.   Remember to brush your teeth WITH YOUR REGULAR TOOTHPASTE.   Please read over the following fact sheets that you were given.

## 2021-04-19 ENCOUNTER — Ambulatory Visit (INDEPENDENT_AMBULATORY_CARE_PROVIDER_SITE_OTHER): Payer: PPO | Admitting: Surgical

## 2021-04-19 ENCOUNTER — Encounter: Payer: Self-pay | Admitting: Surgical

## 2021-04-19 VITALS — BP 131/70 | HR 79 | Ht 63.0 in

## 2021-04-19 DIAGNOSIS — C439 Malignant melanoma of skin, unspecified: Secondary | ICD-10-CM

## 2021-04-19 MED ORDER — TRAMADOL HCL 50 MG PO TABS: 50.0000 mg | ORAL_TABLET | Freq: Three times a day (TID) | ORAL | 0 refills | Status: AC | PRN

## 2021-04-19 MED ORDER — ONDANSETRON HCL 4 MG PO TABS: 4.0000 mg | ORAL_TABLET | Freq: Three times a day (TID) | ORAL | 0 refills | Status: DC | PRN

## 2021-04-19 MED ORDER — CIPROFLOXACIN HCL 500 MG PO TABS: 500.0000 mg | ORAL_TABLET | Freq: Two times a day (BID) | ORAL | 0 refills | Status: DC

## 2021-04-19 NOTE — Progress Notes (Signed)
Patient ID: Destiny [name], female    DOB: 08/09/1946, 75 y.o.   MRN: [id number]  Chief Complaint  Patient presents with   Follow-up      ICD-10-CM   1. Melanoma of skin ([place])  C43.9       History of Present Illness: Destiny [name] is a 75 y.o.  female  with a history of melanoma of left foot.  Patient is scheduled for upcoming procedure, reexcision of left heel melanoma by Dr. Barry Dienes followed by left heel reconstruction with possible skin graft and possible application of ACell, scheduled for 04/24/2021 with Dr. Marla Roe.   She does have a history of nausea with anesthesia but no other complications. No history of DVT/PE.  No family history of DVT/PE.  No family or personal history of bleeding or clotting disorders.  Patient is not currently taking any blood thinners.  No history of CVA/MI.   PMH Significant for: Left heel melanoma, diabetes mellitus, hypothyroidism, postoperative nausea vomiting. Hemoglobin A1c 1 day ago 5.9. Patient was seen by preadmission testing yesterday.  Patient reports no recent changes in her health status.  She is here with her son today.  She has been doing dressing changes to the left lower extremity.  Past Medical History: Allergies: Allergies  Allergen Reactions   Aspirin Other (See Comments)    Stomach upset   Atorvastatin Other (See Comments)    cramps   Fentanyl Nausea Only   Lisinopril Other (See Comments)    dizziness   Penicillins Swelling    Has patient had a PCN reaction causing immediate rash, facial/tongue/throat swelling, SOB or lightheadedness with hypotension: Yes Has patient had a PCN reaction causing severe rash involving mucus membranes or skin necrosis: unknown Has patient had a PCN reaction that required hospitalization: No Has patient had a PCN reaction occurring within the last 10 years: No If all of the above answers are "NO", then may proceed with Cephalosporin use.    Hydrocodone Other (See Comments)    "Does  not like how it makes me feel    Current Medications:  Current Outpatient Medications:    acetaminophen (TYLENOL) 500 MG tablet, Take 1,000 mg by mouth every 6 (six) hours as needed (pain)., Disp: , Rfl:    aspirin EC 81 MG tablet, Take 81 mg by mouth in the morning., Disp: , Rfl:    Cholecalciferol (VITAMIN D3) 50 MCG (2000 UT) TABS, Take 2,000 Units by mouth in the morning., Disp: , Rfl:    denosumab (PROLIA) 60 MG/ML SOSY injection, Inject 60 mg into the skin every 6 (six) months., Disp: , Rfl:    ezetimibe (ZETIA) 10 MG tablet, Take 10 mg by mouth in the morning., Disp: , Rfl:    levothyroxine (SYNTHROID) 100 MCG tablet, Take 100 mcg by mouth every morning., Disp: , Rfl:    metFORMIN (GLUCOPHAGE-XR) 500 MG 24 hr tablet, Take 1,000 mg by mouth 2 (two) times daily., Disp: , Rfl: 2   Multiple Vitamins-Minerals (OCUVITE EXTRA) TABS, Take 1 tablet by mouth in the morning and at bedtime., Disp: , Rfl:    OZEMPIC, 1 MG/DOSE, 4 MG/3ML SOPN, Inject 1 mg into the skin every Friday., Disp: , Rfl:    pravastatin (PRAVACHOL) 40 MG tablet, Take 40 mg by mouth every evening., Disp: , Rfl:    vitamin B-12 (CYANOCOBALAMIN) 1000 MCG tablet, Take 1,000 mcg by mouth in the morning., Disp: , Rfl:   Past Medical Problems: Past Medical History:  Diagnosis  Date   Allergy    Ankle dislocation, left, initial encounter 09/23/2017   Arthritis    Blood transfusion    in 1980   Cancer ([place])    left heel melanoma   Diabetes mellitus    History of kidney stones    Hypothyroidism    Osteoporosis    osteopenia   PONV (postoperative nausea and vomiting)    Sleep apnea    does not use cpap   Thyroid disease    hypothyroidism    Past Surgical History: Past Surgical History:  Procedure Laterality Date   COLONOSCOPY     EXCISION MELANOMA WITH SENTINEL LYMPH NODE BIOPSY Left 03/26/2021   Procedure: WIDE LOCAL EXCISION MELANOMA LEFT HEEL, SENTINEL LYMPH NODE MAPPING AND BIOPSY;  Surgeon: [name], MD;   Location: [place];  Service: General;  Laterality: Left;   EXTERNAL FIXATION LEG Left 09/24/2017   Procedure: EXTERNAL FIXATION ankle;  Surgeon: Shona Needles, MD;  Location: [place];  Service: Orthopedics;  Laterality: Left;   EXTERNAL FIXATION REMOVAL Left 09/28/2017   Procedure: REMOVAL EXTERNAL FIXATION LEG;  Surgeon: Shona Needles, MD;  Location: [place];  Service: Orthopedics;  Laterality: Left;   EYE SURGERY Bilateral    cataract   LAPAROSCOPIC GASTRIC BANDING     OPEN REDUCTION INTERNAL FIXATION (ORIF) DISTAL RADIAL FRACTURE Right 07/29/2016   Procedure: RIGHT OPEN REDUCTION INTERNAL FIXATION (ORIF) DISTAL RADIAL FRACTURE;  Surgeon: Leanora Cover, MD;  Location: [place];  Service: Orthopedics;  Laterality: Right;   ORIF ANKLE FRACTURE Left 09/28/2017   Procedure: OPEN REDUCTION INTERNAL FIXATION (ORIF) ANKLE FRACTURE;  Surgeon: Shona Needles, MD;  Location: [place];  Service: Orthopedics;  Laterality: Left;   POLYPECTOMY     TOTAL ABDOMINAL HYSTERECTOMY W/ BILATERAL SALPINGOOPHORECTOMY     WISDOM TOOTH EXTRACTION      Social History: Social History   Socioeconomic History   Marital status: Divorced    Spouse name: Not on file   Number of children: Not on file   Years of education: Not on file   Highest education level: Not on file  Occupational History   Not on file  Tobacco Use   Smoking status: Former    Pack years: 0.00   Smokeless tobacco: Never  Vaping Use   Vaping Use: Never used  Substance and Sexual Activity   Alcohol use: No   Drug use: No   Sexual activity: Never  Other Topics Concern   Not on file  Social History Narrative   Not on file   Social Determinants of Health   Financial Resource Strain: Not on file  Food Insecurity: Not on file  Transportation Needs: Not on file  Physical Activity: Not on file  Stress: Not on file  Social Connections: Not on file  Intimate Partner Violence: Not on file    Family History: Family History   Problem Relation Age of Onset   Lung cancer Sister    Ovarian cancer Sister    Throat cancer Brother     Review of Systems: Review of Systems  Constitutional: Negative.   Respiratory: Negative.    Cardiovascular: Negative.   Gastrointestinal: Negative.    Physical Exam: Vital Signs BP 131/70 (BP Location: Left Arm, Patient Position: Sitting, Cuff Size: Large)   Pulse 79   Ht 5\' 3"  (1.6 m)   SpO2 95%   BMI 35.25 kg/m   Physical Exam  Constitutional:      General: Not in acute  distress.    Appearance: Normal appearance. Not ill-appearing.  HENT:     Head: Normocephalic and atraumatic.  Eyes:     Pupils: Pupils are equal, round Neck:     Musculoskeletal: Normal range of motion.  Cardiovascular:     Rate and Rhythm: Normal rate    Pulses: Normal pulses.  Pulmonary:     Effort: Pulmonary effort is normal. No respiratory distress.  Abdominal:     General: Abdomen is flat. There is no distension.  Musculoskeletal: Normal range of motion.  Skin:    General: Skin is warm and dry.     Findings: No erythema or rash.  Left foot wound noted Neurological:     General: No focal deficit present.     Mental Status: Alert and oriented to person, place, and time. Mental status is at baseline.     Motor: No weakness.  Psychiatric:        Mood and Affect: Mood normal.        Behavior: Behavior normal.    Assessment/Plan: The patient is scheduled for left heel reconstruction with possible skin graft and possible application of ACell with Dr. Marla Roe after reexcision of left heel melanoma by general surgeon Dr. Barry Dienes.  Risks, benefits, and alternatives of procedure discussed, questions answered and consent obtained.    Caprini Score: 7, high; Risk Factors include: Age, BMI greater than 25, recent surgery and length of planned surgery. Recommendation for mechanical and pharmacological prophylaxis. Encourage early ambulation.   Pictures obtained: 04/09/2021  Post-op Rx sent to  pharmacy: Tramadol, Cipro, Zofran  Patient was provided with the General Surgical Risk consent document and Pain Medication Agreement prior to their appointment.  They had adequate time to read through the risk consent documents and Pain Medication Agreement. We also discussed them in person together during this preop appointment. All of their questions were answered to their satisfaction.  Recommended calling if they have any further questions.  Risk consent form and Pain Medication Agreement to be scanned into patient's chart.  The risks that can be encountered with and after a skin graft were discussed and include the following but not limited to these: bleeding, infection, delayed healing, anesthesia risks, skin sensation changes, injury to structures including nerves, blood vessels, and muscles which may be temporary or permanent, allergies to tape, suture materials and glues, blood products, topical preparations or injected agents, skin contour irregularities, skin discoloration and swelling, deep vein thrombosis, cardiac and pulmonary complications, pain, which may persist, failure of the graft and possible need for revisional surgery or staged procedures.  The risks that can be encountered with and after a skin excision and Acell placement were discussed and include the following but not limited to these: bleeding, infection, delayed healing, anesthesia risks, skin sensation changes, injury to structures including nerves, blood vessels, and muscles which may be temporary or permanent, allergies to tape, suture materials and glues, blood products, topical preparations or injected agents, skin contour irregularities, skin discoloration and swelling, deep vein thrombosis, cardiac and pulmonary complications, pain, which may persist, failure of the graft and possible need for revisional surgery or staged procedures.     Electronically signed by: Carola Rhine Kaitlan Bin, PA-C 04/19/2021 1:12 PM

## 2021-04-19 NOTE — H&P (View-Only) (Signed)
Patient ID: Destiny [name], female    DOB: [birth year], 75 y.o.   MRN: [id number]  Chief Complaint  Patient presents with   Follow-up      ICD-10-CM   1. Melanoma of skin ([place])  C43.9       History of Present Illness: Destiny [name] is a 75 y.o.  female  with a history of melanoma of left foot.  Patient is scheduled for upcoming procedure, reexcision of left heel melanoma by Dr. Barry Dienes followed by left heel reconstruction with possible skin graft and possible application of ACell, scheduled for 04/24/2021 with Dr. Marla Roe.   She does have a history of nausea with anesthesia but no other complications. No history of DVT/PE.  No family history of DVT/PE.  No family or personal history of bleeding or clotting disorders.  Patient is not currently taking any blood thinners.  No history of CVA/MI.   PMH Significant for: Left heel melanoma, diabetes mellitus, hypothyroidism, postoperative nausea vomiting. Hemoglobin A1c 1 day ago 5.9. Patient was seen by preadmission testing yesterday.  Patient reports no recent changes in her health status.  She is here with her son today.  She has been doing dressing changes to the left lower extremity.  Past Medical History: Allergies: Allergies  Allergen Reactions   Aspirin Other (See Comments)    Stomach upset   Atorvastatin Other (See Comments)    cramps   Fentanyl Nausea Only   Lisinopril Other (See Comments)    dizziness   Penicillins Swelling    Has patient had a PCN reaction causing immediate rash, facial/tongue/throat swelling, SOB or lightheadedness with hypotension: Yes Has patient had a PCN reaction causing severe rash involving mucus membranes or skin necrosis: unknown Has patient had a PCN reaction that required hospitalization: No Has patient had a PCN reaction occurring within the last 10 years: No If all of the above answers are "NO", then may proceed with Cephalosporin use.    Hydrocodone Other (See Comments)    "Does  not like how it makes me feel    Current Medications:  Current Outpatient Medications:    acetaminophen (TYLENOL) 500 MG tablet, Take 1,000 mg by mouth every 6 (six) hours as needed (pain)., Disp: , Rfl:    aspirin EC 81 MG tablet, Take 81 mg by mouth in the morning., Disp: , Rfl:    Cholecalciferol (VITAMIN D3) 50 MCG (2000 UT) TABS, Take 2,000 Units by mouth in the morning., Disp: , Rfl:    denosumab (PROLIA) 60 MG/ML SOSY injection, Inject 60 mg into the skin every 6 (six) months., Disp: , Rfl:    ezetimibe (ZETIA) 10 MG tablet, Take 10 mg by mouth in the morning., Disp: , Rfl:    levothyroxine (SYNTHROID) 100 MCG tablet, Take 100 mcg by mouth every morning., Disp: , Rfl:    metFORMIN (GLUCOPHAGE-XR) 500 MG 24 hr tablet, Take 1,000 mg by mouth 2 (two) times daily., Disp: , Rfl: 2   Multiple Vitamins-Minerals (OCUVITE EXTRA) TABS, Take 1 tablet by mouth in the morning and at bedtime., Disp: , Rfl:    OZEMPIC, 1 MG/DOSE, 4 MG/3ML SOPN, Inject 1 mg into the skin every Friday., Disp: , Rfl:    pravastatin (PRAVACHOL) 40 MG tablet, Take 40 mg by mouth every evening., Disp: , Rfl:    vitamin B-12 (CYANOCOBALAMIN) 1000 MCG tablet, Take 1,000 mcg by mouth in the morning., Disp: , Rfl:   Past Medical Problems: Past Medical History:  Diagnosis  Date   Allergy    Ankle dislocation, left, initial encounter 09/23/2017   Arthritis    Blood transfusion    in 1980   Cancer ([place])    left heel melanoma   Diabetes mellitus    History of kidney stones    Hypothyroidism    Osteoporosis    osteopenia   PONV (postoperative nausea and vomiting)    Sleep apnea    does not use cpap   Thyroid disease    hypothyroidism    Past Surgical History: Past Surgical History:  Procedure Laterality Date   COLONOSCOPY     EXCISION MELANOMA WITH SENTINEL LYMPH NODE BIOPSY Left 03/26/2021   Procedure: WIDE LOCAL EXCISION MELANOMA LEFT HEEL, SENTINEL LYMPH NODE MAPPING AND BIOPSY;  Surgeon: [name], MD;   Location: [facility];  Service: General;  Laterality: Left;   EXTERNAL FIXATION LEG Left 09/24/2017   Procedure: EXTERNAL FIXATION ankle;  Surgeon: Shona Needles, MD;  Location: [facility];  Service: Orthopedics;  Laterality: Left;   EXTERNAL FIXATION REMOVAL Left 09/28/2017   Procedure: REMOVAL EXTERNAL FIXATION LEG;  Surgeon: Shona Needles, MD;  Location: [facility];  Service: Orthopedics;  Laterality: Left;   EYE SURGERY Bilateral    cataract   LAPAROSCOPIC GASTRIC BANDING     OPEN REDUCTION INTERNAL FIXATION (ORIF) DISTAL RADIAL FRACTURE Right 07/29/2016   Procedure: RIGHT OPEN REDUCTION INTERNAL FIXATION (ORIF) DISTAL RADIAL FRACTURE;  Surgeon: Leanora Cover, MD;  Location: [facility];  Service: Orthopedics;  Laterality: Right;   ORIF ANKLE FRACTURE Left 09/28/2017   Procedure: OPEN REDUCTION INTERNAL FIXATION (ORIF) ANKLE FRACTURE;  Surgeon: Shona Needles, MD;  Location: [facility];  Service: Orthopedics;  Laterality: Left;   POLYPECTOMY     TOTAL ABDOMINAL HYSTERECTOMY W/ BILATERAL SALPINGOOPHORECTOMY     WISDOM TOOTH EXTRACTION      Social History: Social History   Socioeconomic History   Marital status: Divorced    Spouse name: Not on file   Number of children: Not on file   Years of education: Not on file   Highest education level: Not on file  Occupational History   Not on file  Tobacco Use   Smoking status: Former    Pack years: 0.00   Smokeless tobacco: Never  Vaping Use   Vaping Use: Never used  Substance and Sexual Activity   Alcohol use: No   Drug use: No   Sexual activity: Never  Other Topics Concern   Not on file  Social History Narrative   Not on file   Social Determinants of Health   Financial Resource Strain: Not on file  Food Insecurity: Not on file  Transportation Needs: Not on file  Physical Activity: Not on file  Stress: Not on file  Social Connections: Not on file  Intimate Partner Violence: Not on file    Family History: Family History   Problem Relation Age of Onset   Lung cancer Sister    Ovarian cancer Sister    Throat cancer Brother     Review of Systems: Review of Systems  Constitutional: Negative.   Respiratory: Negative.    Cardiovascular: Negative.   Gastrointestinal: Negative.    Physical Exam: Vital Signs BP 131/70 (BP Location: Left Arm, Patient Position: Sitting, Cuff Size: Large)   Pulse 79   Ht 5\' 3"  (1.6 m)   SpO2 95%   BMI 35.25 kg/m   Physical Exam  Constitutional:      General: Not in acute  distress.    Appearance: Normal appearance. Not ill-appearing.  HENT:     Head: Normocephalic and atraumatic.  Eyes:     Pupils: Pupils are equal, round Neck:     Musculoskeletal: Normal range of motion.  Cardiovascular:     Rate and Rhythm: Normal rate    Pulses: Normal pulses.  Pulmonary:     Effort: Pulmonary effort is normal. No respiratory distress.  Abdominal:     General: Abdomen is flat. There is no distension.  Musculoskeletal: Normal range of motion.  Skin:    General: Skin is warm and dry.     Findings: No erythema or rash.  Left foot wound noted Neurological:     General: No focal deficit present.     Mental Status: Alert and oriented to person, place, and time. Mental status is at baseline.     Motor: No weakness.  Psychiatric:        Mood and Affect: Mood normal.        Behavior: Behavior normal.    Assessment/Plan: The patient is scheduled for left heel reconstruction with possible skin graft and possible application of ACell with Dr. Marla Roe after reexcision of left heel melanoma by general surgeon Dr. Barry Dienes.  Risks, benefits, and alternatives of procedure discussed, questions answered and consent obtained.    Caprini Score: 7, high; Risk Factors include: Age, BMI greater than 25, recent surgery and length of planned surgery. Recommendation for mechanical and pharmacological prophylaxis. Encourage early ambulation.   Pictures obtained: 04/09/2021  Post-op Rx sent to  pharmacy: Tramadol, Cipro, Zofran  Patient was provided with the General Surgical Risk consent document and Pain Medication Agreement prior to their appointment.  They had adequate time to read through the risk consent documents and Pain Medication Agreement. We also discussed them in person together during this preop appointment. All of their questions were answered to their satisfaction.  Recommended calling if they have any further questions.  Risk consent form and Pain Medication Agreement to be scanned into patient's chart.  The risks that can be encountered with and after a skin graft were discussed and include the following but not limited to these: bleeding, infection, delayed healing, anesthesia risks, skin sensation changes, injury to structures including nerves, blood vessels, and muscles which may be temporary or permanent, allergies to tape, suture materials and glues, blood products, topical preparations or injected agents, skin contour irregularities, skin discoloration and swelling, deep vein thrombosis, cardiac and pulmonary complications, pain, which may persist, failure of the graft and possible need for revisional surgery or staged procedures.  The risks that can be encountered with and after a skin excision and Acell placement were discussed and include the following but not limited to these: bleeding, infection, delayed healing, anesthesia risks, skin sensation changes, injury to structures including nerves, blood vessels, and muscles which may be temporary or permanent, allergies to tape, suture materials and glues, blood products, topical preparations or injected agents, skin contour irregularities, skin discoloration and swelling, deep vein thrombosis, cardiac and pulmonary complications, pain, which may persist, failure of the graft and possible need for revisional surgery or staged procedures.     Electronically signed by: Carola Rhine Tarik Teixeira, PA-C 04/19/2021 1:12 PM

## 2021-04-24 ENCOUNTER — Encounter (HOSPITAL_COMMUNITY)
Admission: RE | Disposition: A | Discharge status: Home / Self Care | Payer: Self-pay | Source: Home / Self Care | Attending: General Surgery

## 2021-04-24 ENCOUNTER — Ambulatory Visit (HOSPITAL_COMMUNITY)
Admission: RE | Admit: 2021-04-24 | Discharge: 2021-04-24 | Disposition: A | Discharge status: Home / Self Care | Payer: PPO | Attending: General Surgery | Admitting: General Surgery

## 2021-04-24 ENCOUNTER — Other Ambulatory Visit: Payer: Self-pay

## 2021-04-24 ENCOUNTER — Ambulatory Visit (HOSPITAL_COMMUNITY): Payer: PPO | Admitting: Anesthesiology

## 2021-04-24 ENCOUNTER — Encounter (HOSPITAL_COMMUNITY): Payer: Self-pay | Admitting: General Surgery

## 2021-04-24 DIAGNOSIS — G4730 Sleep apnea, unspecified: Secondary | ICD-10-CM | POA: Diagnosis not present

## 2021-04-24 DIAGNOSIS — C4372 Malignant melanoma of left lower limb, including hip: Secondary | ICD-10-CM | POA: Insufficient documentation

## 2021-04-24 DIAGNOSIS — Z888 Allergy status to other drugs, medicaments and biological substances status: Secondary | ICD-10-CM | POA: Insufficient documentation

## 2021-04-24 DIAGNOSIS — Z885 Allergy status to narcotic agent status: Secondary | ICD-10-CM | POA: Insufficient documentation

## 2021-04-24 DIAGNOSIS — E119 Type 2 diabetes mellitus without complications: Secondary | ICD-10-CM | POA: Insufficient documentation

## 2021-04-24 DIAGNOSIS — Z7984 Long term (current) use of oral hypoglycemic drugs: Secondary | ICD-10-CM | POA: Diagnosis not present

## 2021-04-24 DIAGNOSIS — Z79899 Other long term (current) drug therapy: Secondary | ICD-10-CM | POA: Insufficient documentation

## 2021-04-24 DIAGNOSIS — Z880 Allergy status to penicillin: Secondary | ICD-10-CM | POA: Insufficient documentation

## 2021-04-24 DIAGNOSIS — Z79890 Hormone replacement therapy (postmenopausal): Secondary | ICD-10-CM | POA: Diagnosis not present

## 2021-04-24 DIAGNOSIS — Z886 Allergy status to analgesic agent status: Secondary | ICD-10-CM | POA: Diagnosis not present

## 2021-04-24 DIAGNOSIS — E7800 Pure hypercholesterolemia, unspecified: Secondary | ICD-10-CM | POA: Diagnosis not present

## 2021-04-24 DIAGNOSIS — E559 Vitamin D deficiency, unspecified: Secondary | ICD-10-CM | POA: Diagnosis not present

## 2021-04-24 DIAGNOSIS — Z87891 Personal history of nicotine dependence: Secondary | ICD-10-CM | POA: Insufficient documentation

## 2021-04-24 DIAGNOSIS — C439 Malignant melanoma of skin, unspecified: Secondary | ICD-10-CM | POA: Diagnosis not present

## 2021-04-24 DIAGNOSIS — E039 Hypothyroidism, unspecified: Secondary | ICD-10-CM | POA: Diagnosis not present

## 2021-04-24 HISTORY — PX: MELANOMA EXCISION: SHX5266

## 2021-04-24 HISTORY — PX: SKIN FULL THICKNESS GRAFT: SHX442

## 2021-04-24 HISTORY — PX: APPLICATION OF A-CELL OF CHEST/ABDOMEN: SHX6302

## 2021-04-24 LAB — GLUCOSE, CAPILLARY
Glucose-Capillary: 104 mg/dL — ABNORMAL HIGH (ref 70–99)
Glucose-Capillary: 111 mg/dL — ABNORMAL HIGH (ref 70–99)

## 2021-04-24 SURGERY — EXCISION, MELANOMA
Anesthesia: General | Site: Foot | Laterality: Left

## 2021-04-24 MED ORDER — ORAL CARE MOUTH RINSE: 15.0000 mL | Freq: Once | OROMUCOSAL | Status: AC

## 2021-04-24 MED ORDER — EPHEDRINE SULFATE-NACL 50-0.9 MG/10ML-% IV SOSY
PREFILLED_SYRINGE | INTRAVENOUS | Status: DC | PRN
  Administered 2021-04-24: 10 mg via INTRAVENOUS
  Administered 2021-04-24: 5 mg via INTRAVENOUS
  Administered 2021-04-24: 10 mg via INTRAVENOUS

## 2021-04-24 MED ORDER — 0.9 % SODIUM CHLORIDE (POUR BTL) OPTIME
TOPICAL | Status: DC | PRN
  Administered 2021-04-24: 1000 mL

## 2021-04-24 MED ORDER — ONDANSETRON HCL 4 MG/2ML IJ SOLN
4.0000 mg | Freq: Once | INTRAMUSCULAR | Status: AC | PRN
  Administered 2021-04-24: 4 mg via INTRAVENOUS

## 2021-04-24 MED ORDER — ACETAMINOPHEN 500 MG PO TABS
1000.0000 mg | ORAL_TABLET | Freq: Once | ORAL | Status: AC
  Administered 2021-04-24: 1000 mg via ORAL
  Filled 2021-04-24: qty 2

## 2021-04-24 MED ORDER — ACETAMINOPHEN 325 MG PO TABS
650.0000 mg | ORAL_TABLET | ORAL | Status: DC | PRN
Start: 2021-04-24 — End: 2021-04-24
  Administered 2021-04-24: 650 mg via ORAL

## 2021-04-24 MED ORDER — CHLORHEXIDINE GLUCONATE 0.12 % MT SOLN
15.0000 mL | Freq: Once | OROMUCOSAL | Status: AC
  Administered 2021-04-24: 15 mL via OROMUCOSAL
  Filled 2021-04-24: qty 15

## 2021-04-24 MED ORDER — PROPOFOL 10 MG/ML IV BOLUS
INTRAVENOUS | Status: AC
  Filled 2021-04-24: qty 20

## 2021-04-24 MED ORDER — FENTANYL CITRATE (PF) 250 MCG/5ML IJ SOLN
INTRAMUSCULAR | Status: DC | PRN
  Administered 2021-04-24: 50 ug via INTRAVENOUS

## 2021-04-24 MED ORDER — ACETAMINOPHEN 325 MG PO TABS
ORAL_TABLET | ORAL | Status: AC
  Filled 2021-04-24: qty 2

## 2021-04-24 MED ORDER — ONDANSETRON HCL 4 MG/2ML IJ SOLN
INTRAMUSCULAR | Status: AC
  Filled 2021-04-24: qty 2

## 2021-04-24 MED ORDER — SODIUM CHLORIDE 0.9 % IV SOLN: 250.0000 mL | INTRAVENOUS | Status: DC | PRN

## 2021-04-24 MED ORDER — LIDOCAINE-EPINEPHRINE 1 %-1:100000 IJ SOLN
INTRAMUSCULAR | Status: AC
  Filled 2021-04-24: qty 1

## 2021-04-24 MED ORDER — CELECOXIB 200 MG PO CAPS
ORAL_CAPSULE | ORAL | Status: AC
  Filled 2021-04-24: qty 1

## 2021-04-24 MED ORDER — FENTANYL CITRATE (PF) 100 MCG/2ML IJ SOLN
25.0000 ug | INTRAMUSCULAR | Status: DC | PRN
  Administered 2021-04-24: 50 ug via INTRAVENOUS
  Administered 2021-04-24 (×2): 25 ug via INTRAVENOUS
  Administered 2021-04-24: 50 ug via INTRAVENOUS

## 2021-04-24 MED ORDER — ONDANSETRON HCL 4 MG/2ML IJ SOLN
INTRAMUSCULAR | Status: DC | PRN
  Administered 2021-04-24: 4 mg via INTRAVENOUS

## 2021-04-24 MED ORDER — ENSURE PRE-SURGERY PO LIQD: 296.0000 mL | Freq: Once | ORAL | Status: DC

## 2021-04-24 MED ORDER — SODIUM CHLORIDE 0.9% FLUSH: 3.0000 mL | Freq: Two times a day (BID) | INTRAVENOUS | Status: DC

## 2021-04-24 MED ORDER — LACTATED RINGERS IV SOLN: INTRAVENOUS | Status: DC

## 2021-04-24 MED ORDER — FENTANYL CITRATE (PF) 250 MCG/5ML IJ SOLN
INTRAMUSCULAR | Status: AC
  Filled 2021-04-24: qty 5

## 2021-04-24 MED ORDER — AMISULPRIDE (ANTIEMETIC) 5 MG/2ML IV SOLN
INTRAVENOUS | Status: AC
  Filled 2021-04-24: qty 4

## 2021-04-24 MED ORDER — DEXAMETHASONE SODIUM PHOSPHATE 10 MG/ML IJ SOLN
INTRAMUSCULAR | Status: AC
  Filled 2021-04-24: qty 1

## 2021-04-24 MED ORDER — LIDOCAINE 2% (20 MG/ML) 5 ML SYRINGE
INTRAMUSCULAR | Status: DC | PRN
  Administered 2021-04-24: 80 mg via INTRAVENOUS

## 2021-04-24 MED ORDER — BUPIVACAINE HCL (PF) 0.25 % IJ SOLN
INTRAMUSCULAR | Status: AC
  Filled 2021-04-24: qty 30

## 2021-04-24 MED ORDER — PHENYLEPHRINE HCL-NACL 10-0.9 MG/250ML-% IV SOLN
INTRAVENOUS | Status: DC | PRN
  Administered 2021-04-24: 40 ug/min via INTRAVENOUS

## 2021-04-24 MED ORDER — CHLORHEXIDINE GLUCONATE CLOTH 2 % EX PADS: 6.0000 | MEDICATED_PAD | Freq: Once | CUTANEOUS | Status: DC

## 2021-04-24 MED ORDER — DEXAMETHASONE SODIUM PHOSPHATE 10 MG/ML IJ SOLN
INTRAMUSCULAR | Status: DC | PRN
  Administered 2021-04-24: 5 mg via INTRAVENOUS

## 2021-04-24 MED ORDER — EPHEDRINE 5 MG/ML INJ
INTRAVENOUS | Status: AC
  Filled 2021-04-24: qty 10

## 2021-04-24 MED ORDER — ACETAMINOPHEN 650 MG RE SUPP: 650.0000 mg | RECTAL | Status: DC | PRN

## 2021-04-24 MED ORDER — SODIUM CHLORIDE 0.9% FLUSH
3.0000 mL | INTRAVENOUS | Status: DC | PRN
Start: 2021-04-24 — End: 2021-04-24

## 2021-04-24 MED ORDER — SCOPOLAMINE 1 MG/3DAYS TD PT72
1.0000 | MEDICATED_PATCH | TRANSDERMAL | Status: DC
  Administered 2021-04-24: 1.5 mg via TRANSDERMAL

## 2021-04-24 MED ORDER — AMISULPRIDE (ANTIEMETIC) 5 MG/2ML IV SOLN
10.0000 mg | Freq: Once | INTRAVENOUS | Status: AC
  Administered 2021-04-24: 10 mg via INTRAVENOUS

## 2021-04-24 MED ORDER — FENTANYL CITRATE (PF) 100 MCG/2ML IJ SOLN
INTRAMUSCULAR | Status: AC
  Filled 2021-04-24: qty 2

## 2021-04-24 MED ORDER — CIPROFLOXACIN IN D5W 400 MG/200ML IV SOLN
400.0000 mg | INTRAVENOUS | Status: AC
  Administered 2021-04-24: 400 mg via INTRAVENOUS
  Filled 2021-04-24: qty 200

## 2021-04-24 MED ORDER — SCOPOLAMINE 1 MG/3DAYS TD PT72
MEDICATED_PATCH | TRANSDERMAL | Status: AC
  Filled 2021-04-24: qty 1

## 2021-04-24 MED ORDER — BACITRACIN ZINC 500 UNIT/GM EX OINT
TOPICAL_OINTMENT | CUTANEOUS | Status: AC
  Filled 2021-04-24: qty 28.35

## 2021-04-24 MED ORDER — CELECOXIB 200 MG PO CAPS
200.0000 mg | ORAL_CAPSULE | ORAL | Status: AC
  Administered 2021-04-24: 200 mg via ORAL

## 2021-04-24 MED ORDER — PROPOFOL 10 MG/ML IV BOLUS
INTRAVENOUS | Status: DC | PRN
  Administered 2021-04-24: 150 mg via INTRAVENOUS

## 2021-04-24 SURGICAL SUPPLY — 67 items
ADH SKN CLS APL DERMABOND .7 (GAUZE/BANDAGES/DRESSINGS)
APL PRP STRL LF DISP 70% ISPRP (MISCELLANEOUS) ×1
APL SKNCLS STERI-STRIP NONHPOA (GAUZE/BANDAGES/DRESSINGS) ×1
BAG COUNTER SPONGE SURGICOUNT (BAG) ×4 IMPLANT
BAG SPNG CNTER NS LX DISP (BAG) ×2
BAG SURGICOUNT SPONGE COUNTING (BAG) ×2
BENZOIN TINCTURE PRP APPL 2/3 (GAUZE/BANDAGES/DRESSINGS) ×3 IMPLANT
BNDG ELASTIC 4X5.8 VLCR STR LF (GAUZE/BANDAGES/DRESSINGS) ×2 IMPLANT
BNDG ELASTIC 6X5.8 VLCR STR LF (GAUZE/BANDAGES/DRESSINGS) ×2 IMPLANT
BNDG GAUZE ELAST 4 BULKY (GAUZE/BANDAGES/DRESSINGS) ×2 IMPLANT
CANISTER SUCT 3000ML PPV (MISCELLANEOUS) ×3 IMPLANT
CHLORAPREP W/TINT 26 (MISCELLANEOUS) ×3 IMPLANT
CLOSURE WOUND 1/2 X4 (GAUZE/BANDAGES/DRESSINGS)
CNTNR URN SCR LID CUP LEK RST (MISCELLANEOUS) IMPLANT
CONT SPEC 4OZ STRL OR WHT (MISCELLANEOUS) ×3
COVER SURGICAL LIGHT HANDLE (MISCELLANEOUS) ×6 IMPLANT
DERMABOND ADVANCED (GAUZE/BANDAGES/DRESSINGS)
DERMABOND ADVANCED .7 DNX12 (GAUZE/BANDAGES/DRESSINGS) IMPLANT
DRAPE LAPAROTOMY 100X72 PEDS (DRAPES) IMPLANT
DRSG CUTIMED SORBACT 7X9 (GAUZE/BANDAGES/DRESSINGS) ×2 IMPLANT
DRSG PAD ABDOMINAL 8X10 ST (GAUZE/BANDAGES/DRESSINGS) ×2 IMPLANT
DRSG TEGADERM 4X4.75 (GAUZE/BANDAGES/DRESSINGS) IMPLANT
ELECT CAUTERY BLADE 6.4 (BLADE) ×3 IMPLANT
ELECT REM PT RETURN 9FT ADLT (ELECTROSURGICAL) ×6
ELECTRODE REM PT RTRN 9FT ADLT (ELECTROSURGICAL) ×2 IMPLANT
GAUZE 4X4 16PLY ~~LOC~~+RFID DBL (SPONGE) ×6 IMPLANT
GAUZE SPONGE 4X4 12PLY STRL (GAUZE/BANDAGES/DRESSINGS) IMPLANT
GLOVE SURG ENC MOIS LTX SZ6 (GLOVE) ×3 IMPLANT
GLOVE SURG ENC MOIS LTX SZ6.5 (GLOVE) ×3 IMPLANT
GLOVE SURG UNDER LTX SZ6.5 (GLOVE) ×3 IMPLANT
GOWN STRL REUS W/ TWL LRG LVL3 (GOWN DISPOSABLE) ×3 IMPLANT
GOWN STRL REUS W/TWL 2XL LVL3 (GOWN DISPOSABLE) ×3 IMPLANT
GOWN STRL REUS W/TWL LRG LVL3 (GOWN DISPOSABLE) ×9
KIT BASIN OR (CUSTOM PROCEDURE TRAY) ×6 IMPLANT
KIT TURNOVER KIT B (KITS) ×6 IMPLANT
MARKER SKIN DUAL TIP RULER LAB (MISCELLANEOUS) ×3 IMPLANT
MATRIX WOUND 3-LAYER 5X5 (Tissue) ×1 IMPLANT
MICROMATRIX 500MG (Tissue) ×3 IMPLANT
NDL HYPO 25GX1X1/2 BEV (NEEDLE) ×2 IMPLANT
NEEDLE HYPO 25GX1X1/2 BEV (NEEDLE) ×6 IMPLANT
NS IRRIG 1000ML POUR BTL (IV SOLUTION) ×6 IMPLANT
PACK GENERAL/GYN (CUSTOM PROCEDURE TRAY) ×3 IMPLANT
PACK SURGICAL SETUP 50X90 (CUSTOM PROCEDURE TRAY) ×3 IMPLANT
PAD ARMBOARD 7.5X6 YLW CONV (MISCELLANEOUS) ×9 IMPLANT
PENCIL BUTTON HOLSTER BLD 10FT (ELECTRODE) ×3 IMPLANT
PENCIL SMOKE EVACUATOR (MISCELLANEOUS) ×3 IMPLANT
SOLUTION PARTIC MCRMTRX 500MG (Tissue) IMPLANT
SPECIMEN JAR SMALL (MISCELLANEOUS) ×3 IMPLANT
SPLINT FIBERGLASS 4X30 (CAST SUPPLIES) ×2 IMPLANT
STRIP CLOSURE SKIN 1/2X4 (GAUZE/BANDAGES/DRESSINGS) IMPLANT
SUT CHROMIC 4 0 P 3 18 (SUTURE) IMPLANT
SUT ETHILON 2 0 FS 18 (SUTURE) ×3 IMPLANT
SUT ETHILON 4 0 PS 2 18 (SUTURE) IMPLANT
SUT ETHILON 5 0 P 3 18 (SUTURE)
SUT MNCRL AB 4-0 PS2 18 (SUTURE) ×3 IMPLANT
SUT NYLON ETHILON 5-0 P-3 1X18 (SUTURE) IMPLANT
SUT SILK 2 0 PERMA HAND 18 BK (SUTURE) IMPLANT
SUT VIC AB 2-0 SH 27 (SUTURE) ×3
SUT VIC AB 2-0 SH 27XBRD (SUTURE) ×1 IMPLANT
SUT VIC AB 3-0 SH 27 (SUTURE) ×3
SUT VIC AB 3-0 SH 27X BRD (SUTURE) ×1 IMPLANT
SUT VIC AB 5-0 PS2 18 (SUTURE) ×2 IMPLANT
SYR BULB EAR ULCER 3OZ GRN STR (SYRINGE) IMPLANT
SYR CONTROL 10ML LL (SYRINGE) ×6 IMPLANT
TOWEL GREEN STERILE (TOWEL DISPOSABLE) ×3 IMPLANT
TOWEL GREEN STERILE FF (TOWEL DISPOSABLE) ×6 IMPLANT
WOUND MATRIX 3-LAYER 5X5 (Tissue) ×1 IMPLANT

## 2021-04-24 NOTE — Discharge Instructions (Addendum)
Wound Care with Acell  Guide to Wound Care  Proper wound care may reduce the risk of infection, improve healing rates, and limit scarring.  This is a general guide to help care for and manage wounds treated with ACell MicroMatrix?or Cytal Wound Matrix.   Dressing Changes The frequency of dressing changes can vary based on which product was applied, the size of the wound, or the amount of wound drainage. Dressing inspections are recommended, at least weekly.   Place KY gel on the wound daily and cover with gauze starting after the follow up appointment in the office which should be in one week.  Dressing Types Primary Dressing:  Non-adherent dressing goes directly over wounds being treated with the powder or sheet (MicroMatrix and/or Cytal).  Secondary Dressing:  Secures the primary dressing in place and provides extra protection, compression, and absorption.  1. Wash Hands - To help decrease the risk of infection, caregivers should wash their hands for a minimum of 20 seconds and may use medical gloves.   2. Remove the Dressings - Avoid removing product from the wound by carefully removing the applicable dressing(s) at the time points recommended above, or as recommended by the treating physician.  Expected Color and Odor:  It is entirely normal for the wound to have an unpleasant odor and to form a caramel-colored gel as the product absorbs into the wound. It is  important to leave this gel on the wound site.  3. Clean the Wound - Use clean water or saline to gently rinse around the wound surface and remove any excess discharge that may be present on the wound. Do not wipe off any of the caramel-colored gel on the wound.   What to look out for:  Large or increased amount of drainage   Surrounding skin has worsening redness or hot to touch   Increased pain in or around the wound   Flu-like symptoms, fatigue, decreased appetite, fever   Hard, crusty wound surface with black or brown  coloring  4. Apply New Dressings - Dressings should cover the entire wound and be suitable for maintaining a moist wound environment.  The non-adherent mesh dressing should be left in place.  New dressing should consist of KY Jelly to keep the wound moist and soft gauze secured with a wrap or tape.   Maintain a Hydrated Wound Area It is important to keep the wound area moist throughout the healing process. If the wound appears to be dry during dressing changes, select a dressing that will hydrate the wound and maintain that ideal moist environment. If you are unsure what to do, ask the treating physician.  Remodeling Process Every patient heals differently, and no two cases are the same. The size and location of the wound, product type and layering configurations, and general patient health all contribute to how quickly a wound will heal.  While many factors can influence the rate at which the product absorbs, the following can be used as a general guide.   THINGS TO DO: Refrain from smoking High protein diet with plenty of vegetables and some fruit  Limit simple processed carbohydrates and sugar Protect the wound from trauma Protect the dressing  Micromatrix powder       Cytal Sheet            Sorbact dressing

## 2021-04-24 NOTE — Op Note (Signed)
DATE OF OPERATION: 04/24/2021  LOCATION: [facility]  PREOPERATIVE DIAGNOSIS: Melanoma left foot  POSTOPERATIVE DIAGNOSIS: Same  PROCEDURE: preparation of left foot wound for placement of Acell powder 500 mg and sheet 5 x 5 cm.  SURGEON: Nyxon Strupp Sanger Camiah Humm, DO  ASSISTANT: Roetta Sessions, PA  EBL: none  CONDITION: Stable  COMPLICATIONS: None  INDICATION: The patient, Destiny [name], is a 75 y.o. female born on 05-17-1946, is here for treatment of the left foot melanoma.   PROCEDURE DETAILS:  The patient was seen prior to surgery and marked.  The IV antibiotics were given. The patient was taken to the operating room and given a general anesthetic. A standard time out was performed and all information was confirmed by those in the room. SCD was placed on the right leg.   The Melanoma margins were excised by general surgery.  The patient was then rendered to the plastic surgery team.  The wound was 4.5 x 4.5 cm.  Hemostasis was achieved with electrocautery.  All of the Acell powder and sheet was applied and secured with the 5-0 Vicryl.  The sorbact was applied secured with the vicryl.   A sterile dressing was applied and a plantar blocking splint.  The patient was allowed to wake up and taken to recovery room in stable condition at the end of the case. The family was notified at the end of the case.   The advanced practice practitioner (APP) assisted throughout the case.  The APP was essential in retraction and counter traction when needed to make the case progress smoothly.  This retraction and assistance made it possible to see the tissue plans for the procedure.  The assistance was needed for blood control, tissue re-approximation and assisted with closure of the incision site.

## 2021-04-24 NOTE — Interval H&P Note (Signed)
History and Physical Interval Note:  04/24/2021 2:04 PM  Destiny [name]  has presented today for surgery, with the diagnosis of LEFT HEEL MELANOMA.  The various methods of treatment have been discussed with the patient and family. After consideration of risks, benefits and other options for treatment, the patient has consented to  Procedure(s): REEXCISION MARGIN LEFT HEEL MELANOMA (Left) RECONSTRUCTION OF LEFT HEEL, POSSIBLE SKIN GRAFT (Left) POSSIBLE APPLICATION OF A-CELL LEFT FOOT (Left) as a surgical intervention.  The patient's history has been reviewed, patient examined, no change in status, stable for surgery.  I have reviewed the patient's chart and labs.  Questions were answered to the patient's satisfaction.     Loel Lofty Daylee Delahoz

## 2021-04-24 NOTE — Anesthesia Procedure Notes (Signed)
Procedure Name: LMA Insertion Date/Time: 04/24/2021 2:15 PM Performed by: Merryl Hacker, RN Pre-anesthesia Checklist: Patient identified, Emergency Drugs available, Suction available and Patient being monitored Patient Re-evaluated:Patient Re-evaluated prior to induction Oxygen Delivery Method: Circle system utilized Preoxygenation: Pre-oxygenation with 100% oxygen Induction Type: IV induction Ventilation: Mask ventilation without difficulty LMA: LMA inserted LMA Size: 4.0 Tube type: Oral Number of attempts: 1 Placement Confirmation: ETT inserted through vocal cords under direct vision, positive ETCO2 and breath sounds checked- equal and bilateral Tube secured with: Tape Dental Injury: Teeth and Oropharynx as per pre-operative assessment

## 2021-04-24 NOTE — Op Note (Addendum)
PRE-OPERATIVE DIAGNOSIS: left heel melanoma  POST-OPERATIVE DIAGNOSIS:  Same  PROCEDURE:  Procedure(s): Reexcision of left heel melanoma  SURGEON:  Surgeon(s): [name], MD  ASSIST: Ewell Poe, RNFA  ANESTHESIA:   general  DRAINS: none   LOCAL MEDICATIONS USED:  NONE  SPECIMEN:  Source of Specimen:  reexcision left heel: 12 o'clock margin to 6 o'clock margin.  Labelled with single stitch at 12 o'clock (proximal), double short stitch at 3 o'clock (medial), and double long stitch at 6 o'clock (distal)  DISPOSITION OF SPECIMEN:  PATHOLOGY  COUNTS:  YES  DICTATION: .Dragon Dictation  PLAN OF CARE: Discharge to home after PACU  PATIENT DISPOSITION:   Pt remains in the OR with Dr. Marla Roe  FINDINGS:  wound granulating   EBL: min  PROCEDURE:  Patient was identified in the holding area and then was taken to the operating room where she was placed supine on the operating room table.  General anesthesia was induced.  The previous DuoDERM was removed from the foot along with the Adaptic.  The patient's foot, ankle, and lower leg were prepped and draped in sterile fashion.  A timeout was performed according to the surgical safety checklist.  When all was correct, we continued.  The pathology report was carefully examined to evaluate the prior margins that were positive.  The margins that were positive were 12:00 which is proximal and 3:00 which is medial.  A rim of tissue approximately 1 cm in width was taken from around 11:00 to 630.  This was labeled as described above.  The resulting defect was 4.5 x 4.5 cm.  Hemostasis was achieved with cautery.  Pressure was held for approximately 4 minutes as well.  There is no evidence of bleeding.  The patient was then turned over to Dr. Marla Roe to complete closure of the wound.  This is dictated separately.  After her portion of the case, the patient was allowed to emerge from anesthesia and was taken to the PACU in stable  condition.  Needle, sponge, and instrument counts were correct x 2.

## 2021-04-24 NOTE — Transfer of Care (Signed)
Immediate Anesthesia Transfer of Care Note  Patient: Destiny [name]  Procedure(s) Performed: REEXCISION MARGIN LEFT HEEL MELANOMA (Left: Foot) RECONSTRUCTION OF LEFT HEEL (Left: Foot) APPLICATION OF A-CELL LEFT FOOT (Left: Foot)  Patient Location: PACU  Anesthesia Type:General  Level of Consciousness: awake, alert  and oriented  Airway & Oxygen Therapy: Patient Spontanous Breathing and Patient connected to face mask oxygen  Post-op Assessment: Report given to RN, Post -op Vital signs reviewed and stable and Patient moving all extremities  Post vital signs: Reviewed and stable  Last Vitals:  Vitals Value Taken Time  BP 128/64 04/24/21 1515  Temp    Pulse 77 04/24/21 1516  Resp 22 04/24/21 1516  SpO2 97 % 04/24/21 1516  Vitals shown include unvalidated device data.  Last Pain:  Vitals:   04/24/21 1358  TempSrc:   PainSc: 0-No pain      Patients Stated Pain Goal: 5 (08/06/84 2778)  Complications: No notable events documented.

## 2021-04-24 NOTE — Anesthesia Preprocedure Evaluation (Addendum)
Anesthesia Evaluation  Patient identified by MRN, date of birth, ID band Patient awake    Reviewed: Allergy & Precautions, NPO status , Patient's Chart, lab work & pertinent test results  History of Anesthesia Complications (+) PONV and history of anesthetic complications  Airway Mallampati: II  TM Distance: >3 FB Neck ROM: Full    Dental  (+) Dental Advisory Given, Upper Dentures   Pulmonary sleep apnea , former smoker,    Pulmonary exam normal breath sounds clear to auscultation       Cardiovascular hypertension, Normal cardiovascular exam Rhythm:Regular Rate:Normal     Neuro/Psych  Neuromuscular disease negative psych ROS   GI/Hepatic negative GI ROS, Neg liver ROS,   Endo/Other  diabetes, Type 2, Oral Hypoglycemic AgentsHypothyroidism Obesity   Renal/GU negative Renal ROS     Musculoskeletal  (+) Arthritis ,   Abdominal   Peds  Hematology negative hematology ROS (+)   Anesthesia Other Findings Day of surgery medications reviewed with the patient.  left heel melanoma   Reproductive/Obstetrics                            Anesthesia Physical Anesthesia Plan  ASA: 3  Anesthesia Plan: General   Post-op Pain Management:    Induction: Intravenous  PONV Risk Score and Plan: 4 or greater and Dexamethasone, Ondansetron, Scopolamine patch - Pre-op and Treatment may vary due to age or medical condition  Airway Management Planned: LMA  Additional Equipment:   Intra-op Plan:   Post-operative Plan: Extubation in OR  Informed Consent: I have reviewed the patients History and Physical, chart, labs and discussed the procedure including the risks, benefits and alternatives for the proposed anesthesia with the patient or authorized representative who has indicated his/her understanding and acceptance.     Dental advisory given  Plan Discussed with: CRNA  Anesthesia Plan Comments:         Anesthesia Quick Evaluation

## 2021-04-24 NOTE — Interval H&P Note (Signed)
History and Physical Interval Note:  04/24/2021 1:44 PM  Destiny [name]  has presented today for surgery, with the diagnosis of LEFT HEEL MELANOMA.  The various methods of treatment have been discussed with the patient and family. After consideration of risks, benefits and other options for treatment, the patient has consented to  Procedure(s): REEXCISION MARGIN LEFT HEEL MELANOMA (Left) RECONSTRUCTION OF LEFT HEEL, POSSIBLE SKIN GRAFT (Left) POSSIBLE APPLICATION OF A-CELL LEFT FOOT (Left) as a surgical intervention.  The patient's history has been reviewed, patient examined, no change in status, stable for surgery.  I have reviewed the patient's chart and labs.  Questions were answered to the patient's satisfaction.     [name]

## 2021-04-25 ENCOUNTER — Encounter (HOSPITAL_COMMUNITY): Payer: Self-pay | Admitting: General Surgery

## 2021-04-25 NOTE — Anesthesia Postprocedure Evaluation (Signed)
Anesthesia Post Note  Patient: KAILANA BENNINGER  Procedure(s) Performed: REEXCISION MARGIN LEFT HEEL MELANOMA (Left: Foot) RECONSTRUCTION OF LEFT HEEL (Left: Foot) APPLICATION OF A-CELL LEFT FOOT (Left: Foot)     Patient location during evaluation: PACU Anesthesia Type: General Level of consciousness: awake and alert Pain management: pain level controlled Vital Signs Assessment: post-procedure vital signs reviewed and stable Respiratory status: spontaneous breathing, nonlabored ventilation, respiratory function stable and patient connected to nasal cannula oxygen Cardiovascular status: blood pressure returned to baseline and stable Postop Assessment: no apparent nausea or vomiting Anesthetic complications: no   No notable events documented.  Last Vitals:  Vitals:   04/24/21 1600 04/24/21 1615  BP: 129/83 (!) 148/66  Pulse: 92 72  Resp: 14 16  Temp:    SpO2: 97% 98%    Last Pain:  Vitals:   04/24/21 1600  TempSrc:   PainSc: [place]

## 2021-04-30 ENCOUNTER — Encounter (HOSPITAL_COMMUNITY): Payer: Self-pay | Admitting: General Surgery

## 2021-04-30 LAB — SURGICAL PATHOLOGY

## 2021-05-03 ENCOUNTER — Ambulatory Visit (INDEPENDENT_AMBULATORY_CARE_PROVIDER_SITE_OTHER): Payer: PPO | Admitting: Plastic Surgery

## 2021-05-03 ENCOUNTER — Other Ambulatory Visit: Payer: Self-pay

## 2021-05-03 ENCOUNTER — Encounter: Payer: Self-pay | Admitting: Plastic Surgery

## 2021-05-03 DIAGNOSIS — C439 Malignant melanoma of skin, unspecified: Secondary | ICD-10-CM

## 2021-05-03 NOTE — Progress Notes (Signed)
   Subjective:    Patient ID: Destiny [name], female    DOB: [birth year], 75 y.o.   MRN: [id number]  The patient is a 75 year old female here for follow-up on her left foot wound.  She had a reexcision of her melanoma margins 7/13.  ACell was applied to help with laying down some granulation tissue.  The sorbact was removed.  There is no sign of infection.  It was a little bit dry but has some adherence and incorporation.  She has an appointment with Dr. Barry Dienes next week.  She was able to get a scooter and that seems to have helped to keep her off of that area of her foot.     Review of Systems  Constitutional: Negative.   HENT: Negative.    Eyes: Negative.   Respiratory: Negative.    Cardiovascular: Negative.  Negative for leg swelling.  Gastrointestinal: Negative.   Endocrine: Negative.   Genitourinary: Negative.       Objective:   Physical Exam Vitals and nursing note reviewed.  Constitutional:      Appearance: Normal appearance.  Cardiovascular:     Rate and Rhythm: Normal rate.     Pulses: Normal pulses.  Pulmonary:     Effort: Pulmonary effort is normal.  Skin:    Capillary Refill: Capillary refill takes less than 2 seconds.     Coloration: Skin is not jaundiced.     Findings: Lesion present.  Neurological:     Mental Status: She is alert. Mental status is at baseline.        Assessment & Plan:     ICD-10-CM   1. Melanoma of skin ([place])  C43.9     Continue [place] dressings daily.  We will see her back on 10 days.   Pictures were obtained of the patient and placed in the chart with the patient's or guardian's permission.

## 2021-05-06 ENCOUNTER — Other Ambulatory Visit: Payer: Self-pay | Admitting: General Surgery

## 2021-05-13 NOTE — Progress Notes (Signed)
Surgical Instructions    Your procedure is scheduled on Wednesday August 10.  Report to [facility] Main Entrance "A" at 10 A.M., then check in with the Admitting office.  Call this number if you have problems the morning of surgery:  [phone]   If you have any questions prior to your surgery date call [phone]: Open Monday-Friday 8am-4pm    Remember:  Do not eat after midnight the night before your surgery  You may drink clear liquids until 9am the morning of your surgery.   Clear liquids allowed are: Water, Non-Citrus Juices (without pulp), Carbonated Beverages, Clear Tea, Black Coffee Only, and Gatorade    Take these medicines the morning of surgery with A SIP OF WATER:             acetaminophen (TYLENOL) if needed             ezetimibe (ZETIA)             levothyroxine (SYNTHROID)             ondansetron (ZOFRAN) if needed   WHAT DO I DO ABOUT MY DIABETES MEDICATION?  Do not take oral diabetes medicines (pills) the morning of surgery. DO NOT take metFORMIN (GLUCOPHAGE-XR) the morning of surgery.    HOW TO MANAGE YOUR DIABETES BEFORE AND AFTER SURGERY  Why is it important to control my blood sugar before and after surgery? Improving blood sugar levels before and after surgery helps healing and can limit problems. A way of improving blood sugar control is eating a healthy diet by:  Eating less sugar and carbohydrates  Increasing activity/exercise  Talking with your doctor about reaching your blood sugar goals High blood sugars (greater than 180 mg/dL) can raise your risk of infections and slow your recovery, so you will need to focus on controlling your diabetes during the weeks before surgery. Make sure that the doctor who takes care of your diabetes knows about your planned surgery including the date and location.  How do I manage my blood sugar before surgery? Check your blood sugar at least 4 times a day, starting 2 days before surgery, to make sure that the  level is not too high or low.  Check your blood sugar the morning of your surgery when you wake up and every 2 hours until you get to the Short Stay unit.  If your blood sugar is less than 70 mg/dL, you will need to treat for low blood sugar: Do not take insulin. Treat a low blood sugar (less than 70 mg/dL) with  cup of clear juice (cranberry or apple), 4 glucose tablets, OR glucose gel. Recheck blood sugar in 15 minutes after treatment (to make sure it is greater than 70 mg/dL). If your blood sugar is not greater than 70 mg/dL on recheck, call [phone] for further instructions. Report your blood sugar to the short stay nurse when you get to Short Stay.  If you are admitted to the hospital after surgery: Your blood sugar will be checked by the staff and you will probably be given insulin after surgery (instead of oral diabetes medicines) to make sure you have good blood sugar levels. The goal for blood sugar control after surgery is 80-180 mg/dL.  As of today, STOP taking any Aspirin (unless otherwise instructed by your surgeon) Aleve, Naproxen, Ibuprofen, Motrin, Advil, Goody's, BC's, all herbal medications, fish oil, and all vitamins.          Do not wear jewelry or makeup Do  not wear lotions, powders, perfumes/colognes, or deodorant. Do not shave 48 hours prior to surgery.  Men may shave face and neck. Do not bring valuables to the hospital. DO Not wear nail polish, gel polish, artificial nails, or any other type of covering on  natural nails including finger and toenails. If patients have artificial nails, gel coating, etc. that need to be removed by a nail salon please have this removed prior to surgery or surgery may need to be canceled/delayed if the surgeon/ anesthesia feels like the patient is unable to be adequately monitored.             [facility] is not responsible for any belongings or valuables.  Do NOT Smoke (Tobacco/Vaping) or drink Alcohol 24 hours prior to your  procedure If you use a CPAP at night, you may bring all equipment for your overnight stay.   Contacts, glasses, dentures or bridgework may not be worn into surgery, please bring cases for these belongings   For patients admitted to the hospital, discharge time will be determined by your treatment team.   Patients discharged the day of surgery will not be allowed to drive home, and someone needs to stay with them for 24 hours.  ONLY 1 SUPPORT PERSON MAY BE PRESENT WHILE YOU ARE IN SURGERY. IF YOU ARE TO BE ADMITTED ONCE YOU ARE IN YOUR ROOM YOU WILL BE ALLOWED TWO (2) VISITORS.  Minor children may have two parents present. Special consideration for safety and communication needs will be reviewed on a case by case basis.  Special instructions:    Oral Hygiene is also important to reduce your risk of infection.  Remember - BRUSH YOUR TEETH THE MORNING OF SURGERY WITH YOUR REGULAR TOOTHPASTE   [facility]- Preparing For Surgery  Before surgery, you can play an important role. Because skin is not sterile, your skin needs to be as free of germs as possible. You can reduce the number of germs on your skin by washing with CHG (chlorahexidine gluconate) Soap before surgery.  CHG is an antiseptic cleaner which kills germs and bonds with the skin to continue killing germs even after washing.     Please do not use if you have an allergy to CHG or antibacterial soaps. If your skin becomes reddened/irritated stop using the CHG.  Do not shave (including legs and underarms) for at least 48 hours prior to first CHG shower. It is OK to shave your face.  Please follow these instructions carefully.     Shower the NIGHT BEFORE SURGERY and the MORNING OF SURGERY with CHG Soap.   If you chose to wash your hair, wash your hair first as usual with your normal shampoo. After you shampoo, rinse your hair and body thoroughly to remove the shampoo.  Then [employer] and genitals (private parts) with your normal soap  and rinse thoroughly to remove soap.  After that Use CHG Soap as you would any other liquid soap. You can apply CHG directly to the skin and wash gently with a scrungie or a clean washcloth.   Apply the CHG Soap to your body ONLY FROM THE NECK DOWN.  Do not use on open wounds or open sores. Avoid contact with your eyes, ears, mouth and genitals (private parts). Wash Face and genitals (private parts)  with your normal soap.   Wash thoroughly, paying special attention to the area where your surgery will be performed.  Thoroughly rinse your body with warm water from the neck  down.  DO NOT shower/wash with your normal soap after using and rinsing off the CHG Soap.  Pat yourself dry with a CLEAN TOWEL.  Wear CLEAN PAJAMAS to bed the night before surgery  Place CLEAN SHEETS on your bed the night before your surgery  DO NOT SLEEP WITH PETS.   Day of Surgery:  Take a shower with CHG soap. Wear Clean/Comfortable clothing the morning of surgery Do not apply any deodorants/lotions.   Remember to brush your teeth WITH YOUR REGULAR TOOTHPASTE.   Please read over the following fact sheets that you were given.

## 2021-05-14 ENCOUNTER — Encounter (HOSPITAL_COMMUNITY)
Admission: RE | Admit: 2021-05-14 | Discharge: 2021-05-14 | Disposition: A | Discharge status: Home / Self Care | Payer: PPO | Source: Ambulatory Visit | Attending: General Surgery | Admitting: General Surgery

## 2021-05-14 ENCOUNTER — Other Ambulatory Visit: Payer: Self-pay

## 2021-05-14 ENCOUNTER — Encounter (HOSPITAL_COMMUNITY): Payer: Self-pay

## 2021-05-14 DIAGNOSIS — Z01812 Encounter for preprocedural laboratory examination: Secondary | ICD-10-CM | POA: Diagnosis not present

## 2021-05-14 LAB — BASIC METABOLIC PANEL
Anion gap: 10 (ref 5–15)
BUN: 12 mg/dL (ref 8–23)
CO2: 28 mmol/L (ref 22–32)
Calcium: 8.6 mg/dL — ABNORMAL LOW (ref 8.9–10.3)
Chloride: 103 mmol/L (ref 98–111)
Creatinine, Ser: 0.9 mg/dL (ref 0.44–1.00)
GFR, Estimated: >60 mL/min (ref >60)
Glucose, Bld: 87 mg/dL (ref 70–99)
Potassium: 4.3 mmol/L (ref 3.5–5.1)
Sodium: 141 mmol/L (ref 135–145)

## 2021-05-14 LAB — GLUCOSE, CAPILLARY: Glucose-Capillary: 95 mg/dL (ref 70–99)

## 2021-05-14 NOTE — Progress Notes (Signed)
PCP: Deland Pretty, MD Cardiologist: denies  EKG: 03/26/21 CXR: na ECHO: denies Stress Test: denies Cardiac Cath: denies  Fasting Blood Sugar- 120's Checks Blood Sugar_1__ times a week  OSA: Yes CPAP: No  ASA/Blood Thinner: No  Covid test not needed  Anesthesia Review: No  Patient denies shortness of breath, fever, cough, and chest pain at PAT appointment.  Patient verbalized understanding of instructions provided today at the PAT appointment.  Patient asked to review instructions at home and day of surgery.

## 2021-05-17 ENCOUNTER — Other Ambulatory Visit: Payer: Self-pay

## 2021-05-17 ENCOUNTER — Encounter: Payer: Self-pay | Admitting: Surgical

## 2021-05-17 ENCOUNTER — Ambulatory Visit (INDEPENDENT_AMBULATORY_CARE_PROVIDER_SITE_OTHER): Payer: PPO | Admitting: Surgical

## 2021-05-17 VITALS — BP 129/73 | HR 76 | Ht 63.0 in | Wt 198.0 lb

## 2021-05-17 DIAGNOSIS — C439 Malignant melanoma of skin, unspecified: Secondary | ICD-10-CM

## 2021-05-17 MED ORDER — TRAMADOL HCL 50 MG PO TABS: 50.0000 mg | ORAL_TABLET | Freq: Four times a day (QID) | ORAL | 0 refills | Status: AC | PRN

## 2021-05-17 MED ORDER — CIPROFLOXACIN HCL 500 MG PO TABS: 500.0000 mg | ORAL_TABLET | Freq: Two times a day (BID) | ORAL | 0 refills | Status: DC

## 2021-05-17 MED ORDER — ONDANSETRON HCL 4 MG PO TABS: 4.0000 mg | ORAL_TABLET | Freq: Three times a day (TID) | ORAL | 0 refills | Status: DC | PRN

## 2021-05-17 NOTE — H&P (View-Only) (Signed)
Patient ID: Destiny [name], female    DOB: October 10, 1946, 75 y.o.   MRN: [id number]  Chief Complaint  Patient presents with   Post-op Follow-up      ICD-10-CM   1. 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  female  She presents for preoperative evaluation for upcoming procedure, left heel reconstruction with application of wound matrix and possible skin graft with Dr. Marla Roe after reexcision of left heel melanoma by general surgery Dr. Barry Dienes, scheduled for 05/22/2021  History of nausea with anesthesia, no other complications. No history of DVT/PE.  No family history of DVT/PE.  No family or personal history of bleeding or clotting disorders.  Patient is not currently taking any blood thinners.  No history of CVA/MI.   PMH Significant for: Diabetes mellitus with most recent A1c 5.9, hypothyroidism, sleep apnea  Patient completed preadmission testing on 05/14/2021.  Patient reports she is doing well.  No recent changes in her health. She reports she has not been taking aspirin.  Past Medical History: Allergies: Allergies  Allergen Reactions   Aspirin Other (See Comments)    Stomach upset   Atorvastatin Other (See Comments)    cramps   Fentanyl Nausea Only   Lisinopril Other (See Comments)    dizziness   Penicillins Swelling    Has patient had a PCN reaction causing immediate rash, facial/tongue/throat swelling, SOB or lightheadedness with hypotension: Yes Has patient had a PCN reaction causing severe rash involving mucus membranes or skin necrosis: unknown Has patient had a PCN reaction that required hospitalization: No Has patient had a PCN reaction occurring within the last 10 years: No If all of the above answers are "NO", then may proceed with Cephalosporin use.    Hydrocodone Other (See Comments)    "Does not like how it makes me feel    Current Medications:  Current Outpatient Medications:    aspirin EC 81  MG tablet, Take 81 mg by mouth in the morning., Disp: , Rfl:    Cholecalciferol (VITAMIN D3) 50 MCG (2000 UT) TABS, Take 2,000 Units by mouth in the morning., Disp: , Rfl:    ciprofloxacin (CIPRO) 500 MG tablet, Take 1 tablet (500 mg total) by mouth 2 (two) times daily., Disp: 6 tablet, Rfl: 0   denosumab (PROLIA) 60 MG/ML SOSY injection, Inject 60 mg into the skin every 6 (six) months., Disp: , Rfl:    ezetimibe (ZETIA) 10 MG tablet, Take 10 mg by mouth in the morning., Disp: , Rfl:    levothyroxine (SYNTHROID) 100 MCG tablet, Take 100 mcg by mouth every morning., Disp: , Rfl:    metFORMIN (GLUCOPHAGE-XR) 500 MG 24 hr tablet, Take 1,000 mg by mouth 2 (two) times daily., Disp: , Rfl: 2   Multiple Vitamins-Minerals (OCUVITE EXTRA) TABS, Take 1 tablet by mouth in the morning and at bedtime., Disp: , Rfl:    ondansetron (ZOFRAN) 4 MG tablet, Take 1 tablet (4 mg total) by mouth every 8 (eight) hours as needed for nausea or vomiting., Disp: 20 tablet, Rfl: 0   OZEMPIC, 1 MG/DOSE, 4 MG/3ML SOPN, Inject 1 mg into the skin every Friday., Disp: , Rfl:    pravastatin (PRAVACHOL) 40 MG tablet, Take 40 mg by mouth every evening., Disp: , Rfl:    vitamin B-12 (CYANOCOBALAMIN) 1000 MCG tablet, Take 1,000 mcg by mouth in the morning., Disp: , Rfl:  acetaminophen (TYLENOL) 500 MG tablet, Take 1,000 mg by mouth every 6 (six) hours as needed for moderate pain. (Patient not taking: Reported on 05/17/2021), Disp: , Rfl:   Past Medical Problems: Past Medical History:  Diagnosis Date   Allergy    Ankle dislocation, left, initial encounter 09/23/2017   Arthritis    Blood transfusion    in 1980   Cancer ([place])    left heel melanoma   Diabetes mellitus    History of kidney stones    Hypothyroidism    Osteoporosis    osteopenia   PONV (postoperative nausea and vomiting)    Sleep apnea    does not use cpap   Thyroid disease    hypothyroidism    Past Surgical History: Past Surgical History:  Procedure  Laterality Date   APPLICATION OF A-CELL OF CHEST/ABDOMEN Left 04/24/2021   Procedure: APPLICATION OF A-CELL LEFT FOOT;  Surgeon: Wallace Going, DO;  Location: [facility];  Service: Plastics;  Laterality: Left;   COLONOSCOPY     EXCISION MELANOMA WITH SENTINEL LYMPH NODE BIOPSY Left 03/26/2021   Procedure: WIDE LOCAL EXCISION MELANOMA LEFT HEEL, SENTINEL LYMPH NODE MAPPING AND BIOPSY;  Surgeon: [name], MD;  Location: [facility];  Service: General;  Laterality: Left;   EXTERNAL FIXATION LEG Left 09/24/2017   Procedure: EXTERNAL FIXATION ankle;  Surgeon: Shona Needles, MD;  Location: [facility];  Service: Orthopedics;  Laterality: Left;   EXTERNAL FIXATION REMOVAL Left 09/28/2017   Procedure: REMOVAL EXTERNAL FIXATION LEG;  Surgeon: Shona Needles, MD;  Location: [facility];  Service: Orthopedics;  Laterality: Left;   EYE SURGERY Bilateral    cataract   LAPAROSCOPIC GASTRIC BANDING     MELANOMA EXCISION Left 04/24/2021   Procedure: REEXCISION MARGIN LEFT HEEL MELANOMA;  Surgeon: [name], MD;  Location: [facility] [facility];  Service: General;  Laterality: Left;   OPEN REDUCTION INTERNAL FIXATION (ORIF) DISTAL RADIAL FRACTURE Right 07/29/2016   Procedure: RIGHT OPEN REDUCTION INTERNAL FIXATION (ORIF) DISTAL RADIAL FRACTURE;  Surgeon: Leanora Cover, MD;  Location: [facility];  Service: Orthopedics;  Laterality: Right;   ORIF ANKLE FRACTURE Left 09/28/2017   Procedure: OPEN REDUCTION INTERNAL FIXATION (ORIF) ANKLE FRACTURE;  Surgeon: Shona Needles, MD;  Location: [facility];  Service: Orthopedics;  Laterality: Left;   POLYPECTOMY     SKIN FULL THICKNESS GRAFT Left 04/24/2021   Procedure: RECONSTRUCTION OF LEFT HEEL;  Surgeon: Wallace Going, DO;  Location: [facility];  Service: Plastics;  Laterality: Left;   TOTAL ABDOMINAL HYSTERECTOMY W/ BILATERAL SALPINGOOPHORECTOMY     WISDOM TOOTH EXTRACTION      Social History: Social History   Socioeconomic History   Marital status: Divorced    Spouse  name: Not on file   Number of children: Not on file   Years of education: Not on file   Highest education level: Not on file  Occupational History   Not on file  Tobacco Use   Smoking status: Former   Smokeless tobacco: Never  Vaping Use   Vaping Use: Never used  Substance and Sexual Activity   Alcohol use: No   Drug use: No   Sexual activity: Never  Other Topics Concern   Not on file  Social History Narrative   Not on file   Social Determinants of Health   Financial Resource Strain: Not on file  Food Insecurity: Not on file  Transportation Needs: Not on file  Physical Activity: Not on file  Stress: Not  on file  Social Connections: Not on file  Intimate Partner Violence: Not on file    Family History: Family History  Problem Relation Age of Onset   Lung cancer Sister    Ovarian cancer Sister    Throat cancer Brother     Review of Systems: Review of Systems  Constitutional: Negative.   Respiratory: Negative.    Cardiovascular: Negative.   Gastrointestinal: Negative.   Neurological: Negative.    Physical Exam: Vital Signs BP 129/73 (BP Location: Left Arm, Patient Position: Sitting, Cuff Size: Large)   Pulse 76   Ht '5\' 3"'$  (1.6 m)   Wt 198 lb (89.8 kg)   SpO2 95%   BMI 35.07 kg/m   Physical Exam Constitutional:      General: Not in acute distress.    Appearance: Normal appearance. Not ill-appearing.  HENT:     Head: Normocephalic and atraumatic.  Eyes:     Pupils: Pupils are equal, round Neck:     Musculoskeletal: Normal range of motion.  Cardiovascular:     Rate and Rhythm: Normal rate    Pulses: Normal pulses.  Pulmonary:     Effort: Pulmonary effort is normal. No respiratory distress.  Abdominal:     General: Abdomen is flat. There is no distension.  Musculoskeletal: Normal range of motion.  Skin:    General: Skin is warm and dry.     Findings: No erythema or rash.  Neurological:     General: No focal deficit present.     Mental Status:  Alert and oriented to person, place, and time. Mental status is at baseline.     Motor: No weakness.  Psychiatric:        Mood and Affect: Mood normal.        Behavior: Behavior normal.    Assessment/Plan: The patient is scheduled for reexcision of left heel melanoma by general surgery followed by left heel reconstruction with possible placement of wound matrix, possible placement of skin graft with Dr. Marla Roe.  Risks, benefits, and alternatives of procedure discussed, questions answered and consent obtained.    Caprini Score: 7, high; Risk Factors include: Age, BMI greater than 25, recent surgery and length of planned surgery. Recommendation for mechanical and prophylaxis. Encourage early ambulation.   Pictures obtained: 05/03/2021  Post-op Rx sent to pharmacy:  Zofran, Cipro , tramadol  Patient with previous excision of left heel melanoma and left heel reconstruction.  We have previously reviewed the general surgical risk consent document and pain medication agreement.  Patient voiced understanding of the risks are unchanged for the scheduled procedure.  We discussed risks in person together during today's preop appointment. All of her questions were answered to their satisfaction.  Recommended calling if they have any further questions.  Risk consent form and Pain Medication Agreement to be scanned into patient's chart.  Clearances per general surgery  The risks that can be encountered with and after a skin graft were discussed and include the following but not limited to these: bleeding, infection, delayed healing, anesthesia risks, skin sensation changes, injury to structures including nerves, blood vessels, and muscles which may be temporary or permanent, allergies to tape, suture materials and glues, blood products, topical preparations or injected agents, skin contour irregularities, skin discoloration and swelling, deep vein thrombosis, cardiac and pulmonary complications, pain,  which may persist, failure of the graft and possible need for revisional surgery or staged procedures.  The risks that can be encountered with and after a skin excision and  wound matrix placement were discussed and include the following but not limited to these: bleeding, infection, delayed healing, anesthesia risks, skin sensation changes, injury to structures including nerves, blood vessels, and muscles which may be temporary or permanent, allergies to tape, suture materials and glues, blood products, topical preparations or injected agents, skin contour irregularities, skin discoloration and swelling, deep vein thrombosis, cardiac and pulmonary complications, pain, which may persist, failure of the graft and possible need for revisional surgery or staged procedures.    Electronically signed by: Carola Rhine Ezra Marquess, PA-C 05/17/2021 9:19 AM

## 2021-05-17 NOTE — Progress Notes (Signed)
Patient ID: Destiny [name], female    DOB: 24-Feb-1946, 75 y.o.   MRN: [id number]  Chief Complaint  Patient presents with   Post-op Follow-up      ICD-10-CM   1. 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  female  She presents for preoperative evaluation for upcoming procedure, left heel reconstruction with application of wound matrix and possible skin graft with Dr. Marla Roe after reexcision of left heel melanoma by general surgery Dr. Barry Dienes, scheduled for 05/22/2021  History of nausea with anesthesia, no other complications. No history of DVT/PE.  No family history of DVT/PE.  No family or personal history of bleeding or clotting disorders.  Patient is not currently taking any blood thinners.  No history of CVA/MI.   PMH Significant for: Diabetes mellitus with most recent A1c 5.9, hypothyroidism, sleep apnea  Patient completed preadmission testing on 05/14/2021.  Patient reports she is doing well.  No recent changes in her health. She reports she has not been taking aspirin.  Past Medical History: Allergies: Allergies  Allergen Reactions   Aspirin Other (See Comments)    Stomach upset   Atorvastatin Other (See Comments)    cramps   Fentanyl Nausea Only   Lisinopril Other (See Comments)    dizziness   Penicillins Swelling    Has patient had a PCN reaction causing immediate rash, facial/tongue/throat swelling, SOB or lightheadedness with hypotension: Yes Has patient had a PCN reaction causing severe rash involving mucus membranes or skin necrosis: unknown Has patient had a PCN reaction that required hospitalization: No Has patient had a PCN reaction occurring within the last 10 years: No If all of the above answers are "NO", then may proceed with Cephalosporin use.    Hydrocodone Other (See Comments)    "Does not like how it makes me feel    Current Medications:  Current Outpatient Medications:    aspirin EC 81  MG tablet, Take 81 mg by mouth in the morning., Disp: , Rfl:    Cholecalciferol (VITAMIN D3) 50 MCG (2000 UT) TABS, Take 2,000 Units by mouth in the morning., Disp: , Rfl:    ciprofloxacin (CIPRO) 500 MG tablet, Take 1 tablet (500 mg total) by mouth 2 (two) times daily., Disp: 6 tablet, Rfl: 0   denosumab (PROLIA) 60 MG/ML SOSY injection, Inject 60 mg into the skin every 6 (six) months., Disp: , Rfl:    ezetimibe (ZETIA) 10 MG tablet, Take 10 mg by mouth in the morning., Disp: , Rfl:    levothyroxine (SYNTHROID) 100 MCG tablet, Take 100 mcg by mouth every morning., Disp: , Rfl:    metFORMIN (GLUCOPHAGE-XR) 500 MG 24 hr tablet, Take 1,000 mg by mouth 2 (two) times daily., Disp: , Rfl: 2   Multiple Vitamins-Minerals (OCUVITE EXTRA) TABS, Take 1 tablet by mouth in the morning and at bedtime., Disp: , Rfl:    ondansetron (ZOFRAN) 4 MG tablet, Take 1 tablet (4 mg total) by mouth every 8 (eight) hours as needed for nausea or vomiting., Disp: 20 tablet, Rfl: 0   OZEMPIC, 1 MG/DOSE, 4 MG/3ML SOPN, Inject 1 mg into the skin every Friday., Disp: , Rfl:    pravastatin (PRAVACHOL) 40 MG tablet, Take 40 mg by mouth every evening., Disp: , Rfl:    vitamin B-12 (CYANOCOBALAMIN) 1000 MCG tablet, Take 1,000 mcg by mouth in the morning., Disp: , Rfl:  acetaminophen (TYLENOL) 500 MG tablet, Take 1,000 mg by mouth every 6 (six) hours as needed for moderate pain. (Patient not taking: Reported on 05/17/2021), Disp: , Rfl:   Past Medical Problems: Past Medical History:  Diagnosis Date   Allergy    Ankle dislocation, left, initial encounter 09/23/2017   Arthritis    Blood transfusion    in 1980   Cancer ([place])    left heel melanoma   Diabetes mellitus    History of kidney stones    Hypothyroidism    Osteoporosis    osteopenia   PONV (postoperative nausea and vomiting)    Sleep apnea    does not use cpap   Thyroid disease    hypothyroidism    Past Surgical History: Past Surgical History:  Procedure  Laterality Date   APPLICATION OF A-CELL OF CHEST/ABDOMEN Left 04/24/2021   Procedure: APPLICATION OF A-CELL LEFT FOOT;  Surgeon: Wallace Going, DO;  Location: [place];  Service: Plastics;  Laterality: Left;   COLONOSCOPY     EXCISION MELANOMA WITH SENTINEL LYMPH NODE BIOPSY Left 03/26/2021   Procedure: WIDE LOCAL EXCISION MELANOMA LEFT HEEL, SENTINEL LYMPH NODE MAPPING AND BIOPSY;  Surgeon: [name], MD;  Location: [place];  Service: General;  Laterality: Left;   EXTERNAL FIXATION LEG Left 09/24/2017   Procedure: EXTERNAL FIXATION ankle;  Surgeon: Shona Needles, MD;  Location: [place];  Service: Orthopedics;  Laterality: Left;   EXTERNAL FIXATION REMOVAL Left 09/28/2017   Procedure: REMOVAL EXTERNAL FIXATION LEG;  Surgeon: Shona Needles, MD;  Location: [place];  Service: Orthopedics;  Laterality: Left;   EYE SURGERY Bilateral    cataract   LAPAROSCOPIC GASTRIC BANDING     MELANOMA EXCISION Left 04/24/2021   Procedure: REEXCISION MARGIN LEFT HEEL MELANOMA;  Surgeon: [name], MD;  Location: [place];  Service: General;  Laterality: Left;   OPEN REDUCTION INTERNAL FIXATION (ORIF) DISTAL RADIAL FRACTURE Right 07/29/2016   Procedure: RIGHT OPEN REDUCTION INTERNAL FIXATION (ORIF) DISTAL RADIAL FRACTURE;  Surgeon: Leanora Cover, MD;  Location: [place];  Service: Orthopedics;  Laterality: Right;   ORIF ANKLE FRACTURE Left 09/28/2017   Procedure: OPEN REDUCTION INTERNAL FIXATION (ORIF) ANKLE FRACTURE;  Surgeon: Shona Needles, MD;  Location: [place];  Service: Orthopedics;  Laterality: Left;   POLYPECTOMY     SKIN FULL THICKNESS GRAFT Left 04/24/2021   Procedure: RECONSTRUCTION OF LEFT HEEL;  Surgeon: Wallace Going, DO;  Location: [place];  Service: Plastics;  Laterality: Left;   TOTAL ABDOMINAL HYSTERECTOMY W/ BILATERAL SALPINGOOPHORECTOMY     WISDOM TOOTH EXTRACTION      Social History: Social History   Socioeconomic History   Marital status: Divorced    Spouse  name: Not on file   Number of children: Not on file   Years of education: Not on file   Highest education level: Not on file  Occupational History   Not on file  Tobacco Use   Smoking status: Former   Smokeless tobacco: Never  Vaping Use   Vaping Use: Never used  Substance and Sexual Activity   Alcohol use: No   Drug use: No   Sexual activity: Never  Other Topics Concern   Not on file  Social History Narrative   Not on file   Social Determinants of Health   Financial Resource Strain: Not on file  Food Insecurity: Not on file  Transportation Needs: Not on file  Physical Activity: Not on file  Stress: Not  on file  Social Connections: Not on file  Intimate Partner Violence: Not on file    Family History: Family History  Problem Relation Age of Onset   Lung cancer Sister    Ovarian cancer Sister    Throat cancer Brother     Review of Systems: Review of Systems  Constitutional: Negative.   Respiratory: Negative.    Cardiovascular: Negative.   Gastrointestinal: Negative.   Neurological: Negative.    Physical Exam: Vital Signs BP 129/73 (BP Location: Left Arm, Patient Position: Sitting, Cuff Size: Large)   Pulse 76   Ht '5\' 3"'$  (1.6 m)   Wt 198 lb (89.8 kg)   SpO2 95%   BMI 35.07 kg/m   Physical Exam Constitutional:      General: Not in acute distress.    Appearance: Normal appearance. Not ill-appearing.  HENT:     Head: Normocephalic and atraumatic.  Eyes:     Pupils: Pupils are equal, round Neck:     Musculoskeletal: Normal range of motion.  Cardiovascular:     Rate and Rhythm: Normal rate    Pulses: Normal pulses.  Pulmonary:     Effort: Pulmonary effort is normal. No respiratory distress.  Abdominal:     General: Abdomen is flat. There is no distension.  Musculoskeletal: Normal range of motion.  Skin:    General: Skin is warm and dry.     Findings: No erythema or rash.  Neurological:     General: No focal deficit present.     Mental Status:  Alert and oriented to person, place, and time. Mental status is at baseline.     Motor: No weakness.  Psychiatric:        Mood and Affect: Mood normal.        Behavior: Behavior normal.    Assessment/Plan: The patient is scheduled for reexcision of left heel melanoma by general surgery followed by left heel reconstruction with possible placement of wound matrix, possible placement of skin graft with Dr. Marla Roe.  Risks, benefits, and alternatives of procedure discussed, questions answered and consent obtained.    Caprini Score: 7, high; Risk Factors include: Age, BMI greater than 25, recent surgery and length of planned surgery. Recommendation for mechanical and prophylaxis. Encourage early ambulation.   Pictures obtained: 05/03/2021  Post-op Rx sent to pharmacy:  Zofran, Cipro , tramadol  Patient with previous excision of left heel melanoma and left heel reconstruction.  We have previously reviewed the general surgical risk consent document and pain medication agreement.  Patient voiced understanding of the risks are unchanged for the scheduled procedure.  We discussed risks in person together during today's preop appointment. All of her questions were answered to their satisfaction.  Recommended calling if they have any further questions.  Risk consent form and Pain Medication Agreement to be scanned into patient's chart.  Clearances per general surgery  The risks that can be encountered with and after a skin graft were discussed and include the following but not limited to these: bleeding, infection, delayed healing, anesthesia risks, skin sensation changes, injury to structures including nerves, blood vessels, and muscles which may be temporary or permanent, allergies to tape, suture materials and glues, blood products, topical preparations or injected agents, skin contour irregularities, skin discoloration and swelling, deep vein thrombosis, cardiac and pulmonary complications, pain,  which may persist, failure of the graft and possible need for revisional surgery or staged procedures.  The risks that can be encountered with and after a skin excision and  wound matrix placement were discussed and include the following but not limited to these: bleeding, infection, delayed healing, anesthesia risks, skin sensation changes, injury to structures including nerves, blood vessels, and muscles which may be temporary or permanent, allergies to tape, suture materials and glues, blood products, topical preparations or injected agents, skin contour irregularities, skin discoloration and swelling, deep vein thrombosis, cardiac and pulmonary complications, pain, which may persist, failure of the graft and possible need for revisional surgery or staged procedures.    Electronically signed by: Carola Rhine Yarrow Linhart, PA-C 05/17/2021 9:19 AM

## 2021-05-21 NOTE — H&P (Signed)
Interval History:   Pt is s/p wide local excision and reexcision of left heel melanoma. There was a tiny bit of non invasive cancer present on the reexcision. Dr. Marla Roe did a temporary closure with A-cell. She hasn't had any pain. She is doing the dressing daily on the A-cell with adaptic, surgilube, telfa, kerlix, a posterior splint with webril and ace wrap.   Pathology 03/26/21 A.   LYMPH NODE, LEFT GROIN #1, SENTINEL, EXCISION: 0/1 SENTINEL LYMPH  NODE, NEGATIVE FOR MELANOMA   B.   LYMPH NODE, LEFT GROIN #2, SENTINEL, EXCISION: 0/1 SENTINEL LYMPH  NODE, NEGATIVE FOR MELANOMA   COMMENT: BOTH LYMPH NODES WERE EVALUATED BY H AND E, HMB45, SOX-10, AND  MELAN-A AND ARE NEGATIVE FOR MELANOMA. THIS IS 0/2 SENTINEL LYMPH NODES,  NEGATIVE FOR MELANOMA.   C.   SKIN, LEFT HEEL, EXCISION: RESIDUAL INVASIVE ACRAL MELANOMA, 2.5 MM  IN EXCISIONAL SPECIMEN, STILL PT3B LESION (PRIOR WAS 3.1 MM, NO  UPSTAGE); NEUTROTROPISM IDENTIFIED, POSITIVE MARGINS BELOW:   -POSITIVE AT 3 O'CLOCK MARGIN (BLOCK C6,C7) FOR MELANOMA IN SITU  -LESS THAN 0.05 MM FROM  12 O'CLOCK MARGIN (BLOCK C10) FOR MELANOMA IN  SITU  REMAINING MARGINS NEGATIVE, TABLE REPEATED FOR COMPLETENESS  RECOMMEND RE-EXCISION OF 3 AND 12 O'CLOCK MARGINS   reexcision pathology 04/24/21 A.   SKIN, LEFT HEEL, RE-EXCISION: RESIDUAL MELANOMA IN SITU, NO  RESIDUAL INVASIVE MELANOMA (STAGE UNCHANGED FROM [id number] AS PT3B N0  MX), 6 O'CLOCK MARGIN INVOLVED BY MELANOMA IN SITU   Physical Examination:   The left heel wound is granulating under the acell.   Assessment and Plan:   Destiny [name] is a 75 y.o. female who underwent wide local excision left heel melanoma with SLN bx on 03/26/21, then had takeback 04/24/21.  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 Given small amount of non invasive cancer, will see if we can do at the time of skin  graft.   Dr. Marla Roe has appt to recheck wound.   She still needs to see dermatology.    Return for surgery.  The plan was discussed in detail with the patient today, who expressed understanding. The patient has my contact information, and understands to call me with any additional questions or concerns in the interval. I would be happy to see the patient back sooner if the need arises.   Georgianne Fick, MD

## 2021-05-22 ENCOUNTER — Ambulatory Visit (HOSPITAL_COMMUNITY): Payer: PPO | Admitting: Anesthesiology

## 2021-05-22 ENCOUNTER — Encounter (HOSPITAL_COMMUNITY): Payer: Self-pay | Admitting: General Surgery

## 2021-05-22 ENCOUNTER — Ambulatory Visit (HOSPITAL_COMMUNITY)
Admission: RE | Admit: 2021-05-22 | Discharge: 2021-05-22 | Disposition: A | Discharge status: Home / Self Care | Payer: PPO | Attending: General Surgery | Admitting: General Surgery

## 2021-05-22 ENCOUNTER — Encounter (HOSPITAL_COMMUNITY)
Admission: RE | Disposition: A | Discharge status: Home / Self Care | Payer: Self-pay | Source: Home / Self Care | Attending: General Surgery

## 2021-05-22 ENCOUNTER — Other Ambulatory Visit: Payer: Self-pay

## 2021-05-22 DIAGNOSIS — Z880 Allergy status to penicillin: Secondary | ICD-10-CM | POA: Insufficient documentation

## 2021-05-22 DIAGNOSIS — C439 Malignant melanoma of skin, unspecified: Secondary | ICD-10-CM | POA: Diagnosis not present

## 2021-05-22 DIAGNOSIS — E119 Type 2 diabetes mellitus without complications: Secondary | ICD-10-CM | POA: Insufficient documentation

## 2021-05-22 DIAGNOSIS — Z79890 Hormone replacement therapy (postmenopausal): Secondary | ICD-10-CM | POA: Diagnosis not present

## 2021-05-22 DIAGNOSIS — C4372 Malignant melanoma of left lower limb, including hip: Secondary | ICD-10-CM | POA: Diagnosis not present

## 2021-05-22 DIAGNOSIS — Z79899 Other long term (current) drug therapy: Secondary | ICD-10-CM | POA: Insufficient documentation

## 2021-05-22 DIAGNOSIS — Z7984 Long term (current) use of oral hypoglycemic drugs: Secondary | ICD-10-CM | POA: Insufficient documentation

## 2021-05-22 DIAGNOSIS — E039 Hypothyroidism, unspecified: Secondary | ICD-10-CM | POA: Diagnosis not present

## 2021-05-22 DIAGNOSIS — Z886 Allergy status to analgesic agent status: Secondary | ICD-10-CM | POA: Insufficient documentation

## 2021-05-22 DIAGNOSIS — D0372 Melanoma in situ of left lower limb, including hip: Secondary | ICD-10-CM | POA: Diagnosis not present

## 2021-05-22 DIAGNOSIS — Z87891 Personal history of nicotine dependence: Secondary | ICD-10-CM | POA: Insufficient documentation

## 2021-05-22 DIAGNOSIS — Z888 Allergy status to other drugs, medicaments and biological substances status: Secondary | ICD-10-CM | POA: Insufficient documentation

## 2021-05-22 DIAGNOSIS — Z7982 Long term (current) use of aspirin: Secondary | ICD-10-CM | POA: Diagnosis not present

## 2021-05-22 DIAGNOSIS — E7800 Pure hypercholesterolemia, unspecified: Secondary | ICD-10-CM | POA: Diagnosis not present

## 2021-05-22 DIAGNOSIS — G4730 Sleep apnea, unspecified: Secondary | ICD-10-CM | POA: Diagnosis not present

## 2021-05-22 DIAGNOSIS — E11610 Type 2 diabetes mellitus with diabetic neuropathic arthropathy: Secondary | ICD-10-CM | POA: Diagnosis not present

## 2021-05-22 HISTORY — PX: SKIN DEBRIDEMENT: SHX5235

## 2021-05-22 HISTORY — PX: MELANOMA EXCISION: SHX5266

## 2021-05-22 HISTORY — PX: SKIN FULL THICKNESS GRAFT: SHX442

## 2021-05-22 HISTORY — PX: APPLICATION OF A-CELL OF CHEST/ABDOMEN: SHX6302

## 2021-05-22 LAB — GLUCOSE, CAPILLARY
Glucose-Capillary: 92 mg/dL (ref 70–99)
Glucose-Capillary: 81 mg/dL (ref 70–99)
Glucose-Capillary: 96 mg/dL (ref 70–99)

## 2021-05-22 SURGERY — EXCISION, MELANOMA
Anesthesia: General | Site: Heel | Laterality: Left

## 2021-05-22 MED ORDER — SODIUM CHLORIDE 0.9% FLUSH: 3.0000 mL | Freq: Two times a day (BID) | INTRAVENOUS | Status: DC

## 2021-05-22 MED ORDER — ACETAMINOPHEN 500 MG PO TABS
1000.0000 mg | ORAL_TABLET | ORAL | Status: AC
  Administered 2021-05-22: 1000 mg via ORAL
  Filled 2021-05-22: qty 2

## 2021-05-22 MED ORDER — METOCLOPRAMIDE HCL 5 MG/ML IJ SOLN
INTRAMUSCULAR | Status: DC | PRN
  Administered 2021-05-22: 10 mg via INTRAVENOUS

## 2021-05-22 MED ORDER — PHENYLEPHRINE HCL-NACL 20-0.9 MG/250ML-% IV SOLN
INTRAVENOUS | Status: DC | PRN
  Administered 2021-05-22: 50 ug/min via INTRAVENOUS

## 2021-05-22 MED ORDER — LIDOCAINE 2% (20 MG/ML) 5 ML SYRINGE
INTRAMUSCULAR | Status: AC
  Filled 2021-05-22: qty 5

## 2021-05-22 MED ORDER — LACTATED RINGERS IV SOLN: INTRAVENOUS | Status: DC

## 2021-05-22 MED ORDER — METOCLOPRAMIDE HCL 5 MG/ML IJ SOLN
INTRAMUSCULAR | Status: AC
  Filled 2021-05-22: qty 2

## 2021-05-22 MED ORDER — FENTANYL CITRATE (PF) 100 MCG/2ML IJ SOLN
INTRAMUSCULAR | Status: DC | PRN
  Administered 2021-05-22: 100 ug via INTRAVENOUS

## 2021-05-22 MED ORDER — ONDANSETRON HCL 4 MG/2ML IJ SOLN
INTRAMUSCULAR | Status: DC | PRN
  Administered 2021-05-22: 4 mg via INTRAVENOUS

## 2021-05-22 MED ORDER — SODIUM CHLORIDE 0.9 % IV SOLN: 250.0000 mL | INTRAVENOUS | Status: DC | PRN

## 2021-05-22 MED ORDER — LIDOCAINE HCL 1 % IJ SOLN
INTRAMUSCULAR | Status: DC | PRN
  Administered 2021-05-22: 4 mL

## 2021-05-22 MED ORDER — CHLORHEXIDINE GLUCONATE CLOTH 2 % EX PADS: 6.0000 | MEDICATED_PAD | Freq: Once | CUTANEOUS | Status: DC

## 2021-05-22 MED ORDER — PROPOFOL 10 MG/ML IV BOLUS
INTRAVENOUS | Status: DC | PRN
  Administered 2021-05-22: 170 mg via INTRAVENOUS

## 2021-05-22 MED ORDER — FENTANYL CITRATE (PF) 250 MCG/5ML IJ SOLN
INTRAMUSCULAR | Status: AC
  Filled 2021-05-22: qty 5

## 2021-05-22 MED ORDER — SCOPOLAMINE 1 MG/3DAYS TD PT72
1.0000 | MEDICATED_PATCH | TRANSDERMAL | Status: DC
  Administered 2021-05-22: 1.5 mg via TRANSDERMAL

## 2021-05-22 MED ORDER — MIDAZOLAM HCL 2 MG/2ML IJ SOLN
INTRAMUSCULAR | Status: AC
  Filled 2021-05-22: qty 2

## 2021-05-22 MED ORDER — SODIUM CHLORIDE 0.9% FLUSH: 3.0000 mL | INTRAVENOUS | Status: DC | PRN

## 2021-05-22 MED ORDER — DEXAMETHASONE SODIUM PHOSPHATE 10 MG/ML IJ SOLN
INTRAMUSCULAR | Status: AC
  Filled 2021-05-22: qty 1

## 2021-05-22 MED ORDER — PHENYLEPHRINE 40 MCG/ML (10ML) SYRINGE FOR IV PUSH (FOR BLOOD PRESSURE SUPPORT)
PREFILLED_SYRINGE | INTRAVENOUS | Status: AC
  Filled 2021-05-22: qty 10

## 2021-05-22 MED ORDER — DEXAMETHASONE SODIUM PHOSPHATE 10 MG/ML IJ SOLN
INTRAMUSCULAR | Status: DC | PRN
  Administered 2021-05-22: 5 mg via INTRAVENOUS

## 2021-05-22 MED ORDER — CIPROFLOXACIN IN D5W 400 MG/200ML IV SOLN
400.0000 mg | INTRAVENOUS | Status: AC
  Administered 2021-05-22: 400 mg via INTRAVENOUS
  Filled 2021-05-22: qty 200

## 2021-05-22 MED ORDER — ACETAMINOPHEN 650 MG RE SUPP: 650.0000 mg | RECTAL | Status: DC | PRN

## 2021-05-22 MED ORDER — SCOPOLAMINE 1 MG/3DAYS TD PT72
MEDICATED_PATCH | TRANSDERMAL | Status: AC
  Filled 2021-05-22: qty 1

## 2021-05-22 MED ORDER — PROPOFOL 1000 MG/100ML IV EMUL
INTRAVENOUS | Status: AC
  Filled 2021-05-22: qty 100

## 2021-05-22 MED ORDER — PROPOFOL 500 MG/50ML IV EMUL
INTRAVENOUS | Status: DC | PRN
Start: 2021-05-22 — End: 2021-05-22
  Administered 2021-05-22: 50 ug/kg/min via INTRAVENOUS

## 2021-05-22 MED ORDER — CIPROFLOXACIN IN D5W 400 MG/200ML IV SOLN: 400.0000 mg | INTRAVENOUS | Status: DC

## 2021-05-22 MED ORDER — LIDOCAINE HCL (CARDIAC) PF 100 MG/5ML IV SOSY
PREFILLED_SYRINGE | INTRAVENOUS | Status: DC | PRN
  Administered 2021-05-22: 60 mg via INTRAVENOUS

## 2021-05-22 MED ORDER — ORAL CARE MOUTH RINSE: 15.0000 mL | Freq: Once | OROMUCOSAL | Status: AC

## 2021-05-22 MED ORDER — CHLORHEXIDINE GLUCONATE 0.12 % MT SOLN
15.0000 mL | Freq: Once | OROMUCOSAL | Status: AC
  Administered 2021-05-22: 15 mL via OROMUCOSAL
  Filled 2021-05-22: qty 15

## 2021-05-22 MED ORDER — ONDANSETRON HCL 4 MG/2ML IJ SOLN
INTRAMUSCULAR | Status: AC
  Filled 2021-05-22: qty 2

## 2021-05-22 MED ORDER — BUPIVACAINE-EPINEPHRINE (PF) 0.25% -1:200000 IJ SOLN
INTRAMUSCULAR | Status: AC
  Filled 2021-05-22: qty 30

## 2021-05-22 MED ORDER — ACETAMINOPHEN 325 MG PO TABS: 650.0000 mg | ORAL_TABLET | ORAL | Status: DC | PRN

## 2021-05-22 MED ORDER — SODIUM CHLORIDE 0.9 % IR SOLN
Status: DC | PRN
  Administered 2021-05-22: 1000 mL

## 2021-05-22 MED ORDER — PHENYLEPHRINE 40 MCG/ML (10ML) SYRINGE FOR IV PUSH (FOR BLOOD PRESSURE SUPPORT)
PREFILLED_SYRINGE | INTRAVENOUS | Status: DC | PRN
  Administered 2021-05-22: 40 ug via INTRAVENOUS
  Administered 2021-05-22 (×2): 80 ug via INTRAVENOUS

## 2021-05-22 MED ORDER — BACITRACIN ZINC 500 UNIT/GM EX OINT
TOPICAL_OINTMENT | CUTANEOUS | Status: AC
  Filled 2021-05-22: qty 28.35

## 2021-05-22 MED ORDER — MIDAZOLAM HCL 5 MG/5ML IJ SOLN
INTRAMUSCULAR | Status: DC | PRN
  Administered 2021-05-22: 2 mg via INTRAVENOUS

## 2021-05-22 MED ORDER — LIDOCAINE HCL (PF) 1 % IJ SOLN
INTRAMUSCULAR | Status: AC
  Filled 2021-05-22: qty 30

## 2021-05-22 MED ORDER — CHLORHEXIDINE GLUCONATE CLOTH 2 % EX PADS
6.0000 | MEDICATED_PAD | Freq: Once | CUTANEOUS | Status: DC
Start: 2021-05-22 — End: 2021-05-22

## 2021-05-22 SURGICAL SUPPLY — 69 items
ADH SKN CLS APL DERMABOND .7 (GAUZE/BANDAGES/DRESSINGS)
APL PRP STRL LF DISP 70% ISPRP (MISCELLANEOUS) ×2
APL SKNCLS STERI-STRIP NONHPOA (GAUZE/BANDAGES/DRESSINGS) ×2
BAG COUNTER SPONGE SURGICOUNT (BAG) ×6 IMPLANT
BAG SPNG CNTER NS LX DISP (BAG) ×4
BENZOIN TINCTURE PRP APPL 2/3 (GAUZE/BANDAGES/DRESSINGS) ×3 IMPLANT
BLADE CLIPPER SURG (BLADE) IMPLANT
BNDG ELASTIC 4X5.8 VLCR STR LF (GAUZE/BANDAGES/DRESSINGS) ×1 IMPLANT
BNDG ELASTIC 6X5.8 VLCR STR LF (GAUZE/BANDAGES/DRESSINGS) IMPLANT
BNDG GAUZE ELAST 4 BULKY (GAUZE/BANDAGES/DRESSINGS) ×1 IMPLANT
CANISTER SUCT 3000ML PPV (MISCELLANEOUS) ×6 IMPLANT
CHLORAPREP W/TINT 26 (MISCELLANEOUS) ×3 IMPLANT
COVER SURGICAL LIGHT HANDLE (MISCELLANEOUS) ×6 IMPLANT
DERMABOND ADVANCED (GAUZE/BANDAGES/DRESSINGS)
DERMABOND ADVANCED .7 DNX12 (GAUZE/BANDAGES/DRESSINGS) IMPLANT
DRAPE HALF SHEET 40X57 (DRAPES) IMPLANT
DRAPE INCISE IOBAN 66X45 STRL (DRAPES) IMPLANT
DRAPE LAPAROTOMY 100X72 PEDS (DRAPES) IMPLANT
DRAPE ORTHO SPLIT 77X108 STRL (DRAPES)
DRAPE SURG ORHT 6 SPLT 77X108 (DRAPES) IMPLANT
DRSG ADAPTIC 3X8 NADH LF (GAUZE/BANDAGES/DRESSINGS) IMPLANT
DRSG CUTIMED SORBACT 7X9 (GAUZE/BANDAGES/DRESSINGS) ×1 IMPLANT
DRSG EMULSION OIL 3X3 NADH (GAUZE/BANDAGES/DRESSINGS) IMPLANT
DRSG OPSITE 6X11 MED (GAUZE/BANDAGES/DRESSINGS) IMPLANT
DRSG PAD ABDOMINAL 8X10 ST (GAUZE/BANDAGES/DRESSINGS) ×1 IMPLANT
DRSG TEGADERM 4X4.75 (GAUZE/BANDAGES/DRESSINGS) IMPLANT
DRSG TELFA 3X8 NADH (GAUZE/BANDAGES/DRESSINGS) IMPLANT
ELECT CAUTERY BLADE 6.4 (BLADE) ×3 IMPLANT
ELECT REM PT RETURN 9FT ADLT (ELECTROSURGICAL) ×6
ELECTRODE REM PT RTRN 9FT ADLT (ELECTROSURGICAL) ×4 IMPLANT
GAUZE 4X4 16PLY ~~LOC~~+RFID DBL (SPONGE) ×6 IMPLANT
GAUZE SPONGE 4X4 12PLY STRL (GAUZE/BANDAGES/DRESSINGS) ×1 IMPLANT
GAUZE XEROFORM 5X9 LF (GAUZE/BANDAGES/DRESSINGS) IMPLANT
GLOVE SURG ENC MOIS LTX SZ6 (GLOVE) ×3 IMPLANT
GLOVE SURG ENC MOIS LTX SZ6.5 (GLOVE) ×3 IMPLANT
GLOVE SURG UNDER LTX SZ6.5 (GLOVE) ×3 IMPLANT
GOWN STRL REUS W/ TWL LRG LVL3 (GOWN DISPOSABLE) ×6 IMPLANT
GOWN STRL REUS W/TWL 2XL LVL3 (GOWN DISPOSABLE) ×3 IMPLANT
GOWN STRL REUS W/TWL LRG LVL3 (GOWN DISPOSABLE) ×9
KIT BASIN OR (CUSTOM PROCEDURE TRAY) ×6 IMPLANT
KIT TURNOVER KIT B (KITS) ×6 IMPLANT
MARKER SKIN DUAL TIP RULER LAB (MISCELLANEOUS) ×3 IMPLANT
MATRIX WOUND 3-LAYER 5X5 (Tissue) ×1 IMPLANT
MICROMATRIX 1000MG (Tissue) ×3 IMPLANT
NDL HYPO 25GX1X1/2 BEV (NEEDLE) ×4 IMPLANT
NEEDLE HYPO 25GX1X1/2 BEV (NEEDLE) ×6 IMPLANT
NS IRRIG 1000ML POUR BTL (IV SOLUTION) ×6 IMPLANT
PACK GENERAL/GYN (CUSTOM PROCEDURE TRAY) ×6 IMPLANT
PACK SURGICAL SETUP 50X90 (CUSTOM PROCEDURE TRAY) ×3 IMPLANT
PAD ARMBOARD 7.5X6 YLW CONV (MISCELLANEOUS) ×12 IMPLANT
PAD DRESSING TELFA 3X8 NADH (GAUZE/BANDAGES/DRESSINGS) IMPLANT
PADDING CAST COTTON 6X4 STRL (CAST SUPPLIES) IMPLANT
PENCIL BUTTON HOLSTER BLD 10FT (ELECTRODE) ×3 IMPLANT
PENCIL SMOKE EVACUATOR (MISCELLANEOUS) ×3 IMPLANT
SOLUTION PARTIC MCRMTRX 1000MG (Tissue) IMPLANT
SPECIMEN JAR SMALL (MISCELLANEOUS) ×3 IMPLANT
STAPLER VISISTAT 35W (STAPLE) IMPLANT
STRIP CLOSURE SKIN 1/2X4 (GAUZE/BANDAGES/DRESSINGS) IMPLANT
SUT ETHILON 2 0 FS 18 (SUTURE) ×3 IMPLANT
SUT MNCRL AB 4-0 PS2 18 (SUTURE) ×2 IMPLANT
SUT SILK 2 0 PERMA HAND 18 BK (SUTURE) ×1 IMPLANT
SUT VIC AB 5-0 P-3 18XBRD (SUTURE) IMPLANT
SUT VIC AB 5-0 P3 18 (SUTURE) ×6
SYR BULB EAR ULCER 3OZ GRN STR (SYRINGE) IMPLANT
SYR CONTROL 10ML LL (SYRINGE) ×6 IMPLANT
TOWEL GREEN STERILE (TOWEL DISPOSABLE) ×3 IMPLANT
TOWEL GREEN STERILE FF (TOWEL DISPOSABLE) ×6 IMPLANT
UNDERPAD 30X36 HEAVY ABSORB (UNDERPADS AND DIAPERS) ×3 IMPLANT
WATER STERILE IRR 1000ML POUR (IV SOLUTION) IMPLANT

## 2021-05-22 NOTE — Discharge Instructions (Signed)
Wound Care with Acell  Guide to Wound Care  Proper wound care may reduce the risk of infection, improve healing rates, and limit scarring.  This is a general guide to help care for and manage wounds treated with ACell MicroMatrix?or Cytal Wound Matrix.   Dressing Changes The frequency of dressing changes can vary based on which product was applied, the size of the wound, or the amount of wound drainage. Dressing inspections are recommended, at least weekly.   Place KY gel on the wound daily and cover with gauze.  Dressing Types Primary Dressing:  Non-adherent dressing goes directly over wounds being treated with the powder or sheet (MicroMatrix and/or Cytal).  Secondary Dressing:  Secures the primary dressing in place and provides extra protection, compression, and absorption.  1. Wash Hands - To help decrease the risk of infection, caregivers should wash their hands for a minimum of 20 seconds and may use medical gloves.   2. Remove the Dressings - Avoid removing product from the wound by carefully removing the applicable dressing(s) at the time points recommended above, or as recommended by the treating physician.  Expected Color and Odor:  It is entirely normal for the wound to have an unpleasant odor and to form a caramel-colored gel as the product absorbs into the wound. It is  important to leave this gel on the wound site.  3. Clean the Wound - Use clean water or saline to gently rinse around the wound surface and remove any excess discharge that may be present on the wound. Do not wipe off any of the caramel-colored gel on the wound.   What to look out for:  Large or increased amount of drainage   Surrounding skin has worsening redness or hot to touch   Increased pain in or around the wound   Flu-like symptoms, fatigue, decreased appetite, fever   Hard, crusty wound surface with black or brown coloring  4. Apply New Dressings - Dressings should cover the entire wound and be  suitable for maintaining a moist wound environment.  The non-adherent mesh dressing should be left in place.  New dressing should consist of KY Jelly to keep the wound moist and soft gauze secured with a wrap or tape.   Maintain a Hydrated Wound Area It is important to keep the wound area moist throughout the healing process. If the wound appears to be dry during dressing changes, select a dressing that will hydrate the wound and maintain that ideal moist environment. If you are unsure what to do, ask the treating physician.  Remodeling Process Every patient heals differently, and no two cases are the same. The size and location of the wound, product type and layering configurations, and general patient health all contribute to how quickly a wound will heal.  While many factors can influence the rate at which the product absorbs, the following can be used as a general guide.   THINGS TO DO: Refrain from smoking High protein diet with plenty of vegetables and some fruit  Limit simple processed carbohydrates and sugar Protect the wound from trauma Protect the dressing  Micromatrix powder       Cytal Sheet            Sorbact dressing

## 2021-05-22 NOTE — Anesthesia Postprocedure Evaluation (Signed)
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) RECONSTRUCTION LEFT HEEL (Left) POSSIBLE APPLICATION OF A-CELL (Left) POSSIBLE SKIN GRAFT (Left)     Patient location during evaluation: PACU Anesthesia Type: General Level of consciousness: awake and alert Pain management: pain level controlled Vital Signs Assessment: post-procedure vital signs reviewed and stable Respiratory status: spontaneous breathing, nonlabored ventilation, respiratory function stable and patient connected to nasal cannula oxygen Cardiovascular status: blood pressure returned to baseline and stable Postop Assessment: no apparent nausea or vomiting Anesthetic complications: no   No notable events documented.  Last Vitals:  Vitals:   05/22/21 1255 05/22/21 1310  BP: 116/70 120/70  Pulse: 73 72  Resp: 16 19  Temp:  36.4 C  SpO2: 93% 93%    Last Pain:  Vitals:   05/22/21 1310  TempSrc:   PainSc: 0-No pain                 Marcy Bogosian

## 2021-05-22 NOTE — Transfer of Care (Signed)
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) RECONSTRUCTION LEFT HEEL (Left) POSSIBLE APPLICATION OF A-CELL (Left) POSSIBLE SKIN GRAFT (Left)  Patient Location: PACU  Anesthesia Type:General  Level of Consciousness: awake, alert , oriented and patient cooperative  Airway & Oxygen Therapy: Patient Spontanous Breathing  Post-op Assessment: Report given to RN, Post -op Vital signs reviewed and stable and Patient moving all extremities X 4  Post vital signs: Reviewed and stable  Last Vitals:  Vitals Value Taken Time  BP 115/68 05/22/21 1241  Temp 36.1 C 05/22/21 1240  Pulse 81 05/22/21 1246  Resp 13 05/22/21 1246  SpO2 93 % 05/22/21 1246  Vitals shown include unvalidated device data.  Last Pain:  Vitals:   05/22/21 1240  TempSrc:   PainSc: 0-No pain         Complications: No notable events documented.

## 2021-05-22 NOTE — Interval H&P Note (Signed)
History and Physical Interval Note:  05/22/2021 11:36 AM  Destiny [name]  has presented today for surgery, with the diagnosis of LEFT HEEL MELANOMA.  The various methods of treatment have been discussed with the patient and family. After consideration of risks, benefits and other options for treatment, the patient has consented to  Procedure(s): RE-EXCISION OF LEFT HEEL MELANOMA (Left) RECONSTRUCTION LEFT HEEL (Left) POSSIBLE APPLICATION OF A-CELL (Left) POSSIBLE SKIN GRAFT (Left) as a surgical intervention.  The patient's history has been reviewed, patient examined, no change in status, stable for surgery.  I have reviewed the patient's chart and labs.  Questions were answered to the patient's satisfaction.     Loel Lofty [name]

## 2021-05-22 NOTE — Op Note (Signed)
PRE-OPERATIVE DIAGNOSIS: left heel melanoma  POST-OPERATIVE DIAGNOSIS:  Same  PROCEDURE:  Procedure(s): Reexcision left heel melanoma  SURGEON:  Surgeon(s): [name], MD  ANESTHESIA:   general  DRAINS: none   LOCAL MEDICATIONS USED:  BUPIVICAINE  and LIDOCAINE   SPECIMEN:  Source of Specimen:  reexcision of heel melanoma 6 o'clock (distal) margin  DISPOSITION OF SPECIMEN:  PATHOLOGY  COUNTS:  YES  DICTATION: .Dragon Dictation  PLAN OF CARE: Discharge to home after PACU  PATIENT DISPOSITION:   remains in OR with Dr. Marla Roe  FINDINGS:  no gross residual disease.  EBL: minimal  PROCEDURE:  Pt was identified in the holding area and was taken to the OR where she was intubated on the stretcher.  She was then placed prone on the OR table with appropriate padding.  The foot was prepped and draped in sterile fashion.  A timeout was performed according to the surgical safety checklist.  When all was correct, we continued.  The distal margin was examined.  Local anesthetic was infiltrated into the skin.  An additional crescent of tissue was taken.  Hemostasis was achieved with cautery.  The patient was turned over to Dr. Marla Roe.

## 2021-05-22 NOTE — Interval H&P Note (Signed)
History and Physical Interval Note:  05/22/2021 10:49 AM  [name]  has presented today for surgery, with the diagnosis of LEFT HEEL MELANOMA.  The various methods of treatment have been discussed with the patient and family. After consideration of risks, benefits and other options for treatment, the patient has consented to  Procedure(s): RE-EXCISION OF LEFT HEEL MELANOMA (Left) as a surgical intervention.  The patient's history has been reviewed, patient examined, no change in status, stable for surgery.  I have reviewed the patient's chart and labs.  Questions were answered to the patient's satisfaction.     [name]

## 2021-05-22 NOTE — Anesthesia Procedure Notes (Addendum)
Procedure Name: LMA Insertion Date/Time: 05/22/2021 11:44 AM Performed by: Jonna Munro, CRNA Pre-anesthesia Checklist: Emergency Drugs available, Patient identified, Suction available, Patient being monitored and Timeout performed Patient Re-evaluated:Patient Re-evaluated prior to induction Oxygen Delivery Method: Circle system utilized Preoxygenation: Pre-oxygenation with 100% oxygen Induction Type: IV induction Ventilation: Mask ventilation without difficulty LMA: LMA inserted LMA Size: 4.0 Number of attempts: 1 Placement Confirmation: breath sounds checked- equal and bilateral and positive ETCO2

## 2021-05-22 NOTE — Op Note (Signed)
DATE OF OPERATION: 05/22/2021  LOCATION: [facility]  PREOPERATIVE DIAGNOSIS: left foot melanoma  POSTOPERATIVE DIAGNOSIS: Same  PROCEDURE: Preparation of left foot wound 5 x 5 cm for placement of Acell powder 1 gm and sheet 5 x 5 cm  SURGEON: Zonnie Landen [facility] [place] Vandergriff, DO  ASSISTANT: Roetta Sessions, PA  EBL: none  CONDITION: Stable  COMPLICATIONS: None  INDICATION: The patient, Destiny [name], is a 75 y.o. female born on [birth year], is here for treatment of a left foot melanoma.   PROCEDURE DETAILS:  The patient was seen prior to surgery and marked.  The IV antibiotics were given. The patient was taken to the operating room and given a general anesthetic. A standard time out was performed and all information was confirmed by those in the room. SCD was placed on the right leg.   The patient underwent excision of the melanoma by Dr. Barry Dienes.  The 5 x 5 cm wound was debrided of fibrous tissue.  All of the Acell powder and sheet were applied and secured with the 5-0 Vicryl. Sorbact was applied and the leg was wrapped with ky gel, gauze and kerlex.  The patient was allowed to wake up and taken to recovery room in stable condition at the end of the case. The family was notified at the end of the case.   The advanced practice practitioner (APP) assisted throughout the case.  The APP was essential in retraction and counter traction when needed to make the case progress smoothly.  This retraction and assistance made it possible to see the tissue plans for the procedure.  The assistance was needed for blood control, tissue re-approximation and assisted with closure of the incision site.

## 2021-05-22 NOTE — Anesthesia Preprocedure Evaluation (Signed)
Anesthesia Evaluation  Patient identified by MRN, date of birth, ID band Patient awake    Reviewed: Allergy & Precautions, NPO status , Patient's Chart, lab work & pertinent test results  History of Anesthesia Complications (+) PONV and history of anesthetic complications  Airway Mallampati: II  TM Distance: >3 FB Neck ROM: Full    Dental  (+) Dental Advisory Given, Upper Dentures,    Pulmonary sleep apnea , former smoker,    Pulmonary exam normal breath sounds clear to auscultation       Cardiovascular hypertension, Pt. on medications Normal cardiovascular exam Rhythm:Regular Rate:Normal     Neuro/Psych  Neuromuscular disease negative psych ROS   GI/Hepatic negative GI ROS, Neg liver ROS,   Endo/Other  diabetes, Type 2, Oral Hypoglycemic AgentsHypothyroidism Morbid obesityObesity   Renal/GU      Musculoskeletal  (+) Arthritis ,   Abdominal   Peds  Hematology negative hematology ROS (+)   Anesthesia Other Findings    Reproductive/Obstetrics                             Anesthesia Physical  Anesthesia Plan  ASA: 3  Anesthesia Plan: General   Post-op Pain Management:    Induction: Intravenous  PONV Risk Score and Plan: 4 or greater and Dexamethasone, Ondansetron, Scopolamine patch - Pre-op, Treatment may vary due to age or medical condition, Propofol infusion and Aprepitant  Airway Management Planned: LMA  Additional Equipment: None  Intra-op Plan:   Post-operative Plan: Extubation in OR  Informed Consent: I have reviewed the patients History and Physical, chart, labs and discussed the procedure including the risks, benefits and alternatives for the proposed anesthesia with the patient or authorized representative who has indicated his/her understanding and acceptance.     Dental advisory given  Plan Discussed with: CRNA and Anesthesiologist  Anesthesia Plan Comments:          Anesthesia Quick Evaluation

## 2021-05-23 ENCOUNTER — Encounter (HOSPITAL_COMMUNITY): Payer: Self-pay | Admitting: General Surgery

## 2021-05-30 LAB — SURGICAL PATHOLOGY

## 2021-06-07 ENCOUNTER — Encounter: Payer: Self-pay | Admitting: Plastic Surgery

## 2021-06-07 ENCOUNTER — Ambulatory Visit (INDEPENDENT_AMBULATORY_CARE_PROVIDER_SITE_OTHER): Payer: PPO | Admitting: Plastic Surgery

## 2021-06-07 ENCOUNTER — Other Ambulatory Visit: Payer: Self-pay

## 2021-06-07 ENCOUNTER — Telehealth: Payer: Self-pay

## 2021-06-07 ENCOUNTER — Telehealth: Payer: Self-pay | Admitting: *Deleted

## 2021-06-07 DIAGNOSIS — E11610 Type 2 diabetes mellitus with diabetic neuropathic arthropathy: Secondary | ICD-10-CM

## 2021-06-07 DIAGNOSIS — C439 Malignant melanoma of skin, unspecified: Secondary | ICD-10-CM

## 2021-06-07 DIAGNOSIS — L97412 Non-pressure chronic ulcer of right heel and midfoot with fat layer exposed: Secondary | ICD-10-CM | POA: Diagnosis not present

## 2021-06-07 NOTE — Telephone Encounter (Signed)
error 

## 2021-06-07 NOTE — Progress Notes (Signed)
   Subjective:    Patient ID: Destiny [name], female    DOB: 1945-11-04, 75 y.o.   MRN: [id number]  The patient is a 75 yrs old female here for follow up on her left foot.  She had resection of a left foot melanoma. Her margins are now negative.  The area seems to be healing well. The product is incorporating.  It does have a little bit of.  It is dried out.  No sign of infection.  The patient is being really good about offloading.   Review of Systems  Constitutional: Negative.   HENT: Negative.    Eyes: Negative.   Respiratory: Negative.    Cardiovascular: Negative.   Gastrointestinal: Negative.   Endocrine: Negative.   Genitourinary: Negative.   Hematological: Negative.       Objective:   Physical Exam Vitals and nursing note reviewed.  Constitutional:      Appearance: Normal appearance.  HENT:     Head: Normocephalic and atraumatic.  Cardiovascular:     Rate and Rhythm: Normal rate.     Pulses: Normal pulses.  Pulmonary:     Effort: Pulmonary effort is normal.  Neurological:     General: No focal deficit present.     Mental Status: She is alert and oriented to person, place, and time.  Psychiatric:        Mood and Affect: Mood normal.       Assessment & Plan:     ICD-10-CM   1. 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HCC)  E11.610     2. Melanoma of skin ([place])  C43.9       Pictures were obtained of the patient and placed in the chart with the patient's or guardian's permission. She can get wet and shower.  I would recommend the Adaptic and KY dressing changes daily and I would like to see her back in 2 weeks.  At that time we can decide if she is ready for skin graft.  She is hoping to avoid a graft and heal it on its own.

## 2021-06-07 NOTE — Telephone Encounter (Signed)
Faxed order to [facility] for : Adaptic, surgilube, 4x4 guaze, kerlix and ace wrap. Change daily.  Patient is aware she can take a shower, but do not walk on tippy toes.

## 2021-06-10 DIAGNOSIS — L97412 Non-pressure chronic ulcer of right heel and midfoot with fat layer exposed: Secondary | ICD-10-CM | POA: Diagnosis not present

## 2021-06-13 ENCOUNTER — Telehealth: Payer: Self-pay | Admitting: Plastic Surgery

## 2021-06-13 NOTE — Telephone Encounter (Signed)
Patient received shipment from [facility] and has questions regarding gel and care instructions. Please call to advise [phone]. Thank you.

## 2021-06-13 NOTE — Telephone Encounter (Signed)
Returned patients call. She received all her supplies from [facility] Saturday, and another separate package today with [employer]. She had been using her own [facility] for applying her wound. Advised her [employer] is the same water base product and was delayed being delivered  Patient understood the layering of medical supplies. Follow up appointment is on 06/21/21 with Donna Christen at 11:00.

## 2021-06-19 NOTE — Progress Notes (Signed)
Patient is a 75 year old female with past medical history of reexcision of left heel melanoma performed 05/22/2021 who returns to clinic for follow-up.  She was last seen here in clinic on 06/07/2021 at which point she was recommended to continue with daily Adaptic and KY dressing changes and that decision regarding possible skin graft placement can be made at subsequent follow-up.  Patient was then seen by her general surgeon, Dr. Barry Dienes, and 06/14/2021 who notes that her margins are finally negative and plan was for continued outpatient follow-up with plastic surgery.  On my examination today, patient is doing well.  She has been continuing with Adaptic dressing with KY jelly once daily.  She denies any fevers, chills, swelling, redness, or other concerns.  She uses an assistive scooter and has been good about avoiding any pressure on her heel.  My physical exam is reassuring.  The ACell sheet placed in the OR is intact, secured by the 5-0 sutures.  There does appear to be healthy granular tissue centrally.  There does appear to be improvement compared to last encounter.  There is darkening around the edges, likely in part due to being dry.  No cellulitic findings.  Good capillary refill and peripheral pulses.  Plan is for her to apply liberal K-Y jelly twice daily and continue with daily Adaptic dressing changes.  She can follow-up with the clinic next week for ongoing evaluation and management.  I discussed with Dr. Marla Roe who agrees.

## 2021-06-21 ENCOUNTER — Other Ambulatory Visit: Payer: Self-pay

## 2021-06-21 ENCOUNTER — Encounter: Payer: Self-pay | Admitting: Physician Assistant

## 2021-06-21 ENCOUNTER — Encounter: Payer: PPO | Admitting: Physician Assistant

## 2021-06-21 ENCOUNTER — Ambulatory Visit (INDEPENDENT_AMBULATORY_CARE_PROVIDER_SITE_OTHER): Payer: PPO | Admitting: Physician Assistant

## 2021-06-21 DIAGNOSIS — C439 Malignant melanoma of skin, unspecified: Secondary | ICD-10-CM

## 2021-06-28 ENCOUNTER — Other Ambulatory Visit: Payer: Self-pay

## 2021-06-28 ENCOUNTER — Ambulatory Visit: Payer: PPO | Admitting: Plastic Surgery

## 2021-06-28 ENCOUNTER — Encounter: Payer: Self-pay | Admitting: Plastic Surgery

## 2021-06-28 DIAGNOSIS — C439 Malignant melanoma of skin, unspecified: Secondary | ICD-10-CM

## 2021-06-28 NOTE — Progress Notes (Signed)
   Subjective:    Patient ID: Destiny [name], female    DOB: Jan 14, 1946, 75 y.o.   MRN: [id number]  The patient is a 75 year old female here for follow-up on her melanoma.  She had excision of a melanoma of her left heel.  We had to go back for more margins.  The margins are now negative.  She had skin substitute placed.  There is no sign of infection.  There is a little bit of drying of the skin substitute but it is doing much better than it had been.  She is doing really well overall and is filling in the area.    Review of Systems  Constitutional:  Positive for activity change. Negative for appetite change.  Eyes: Negative.   Respiratory: Negative.    Cardiovascular: Negative.   Gastrointestinal: Negative.   Endocrine: Negative.   Genitourinary: Negative.   Skin:  Positive for color change and wound.  Neurological: Negative.   Hematological: Negative.       Objective:   Physical Exam Vitals and nursing note reviewed.  Constitutional:      Appearance: Normal appearance.  HENT:     Head: Normocephalic and atraumatic.  Cardiovascular:     Rate and Rhythm: Normal rate.     Pulses: Normal pulses.  Musculoskeletal:        General: Tenderness present.  Skin:    Capillary Refill: Capillary refill takes less than 2 seconds.     Coloration: Skin is not jaundiced or pale.     Findings: No bruising.  Neurological:     Mental Status: She is alert. Mental status is at baseline.  Psychiatric:        Mood and Affect: Mood normal.        Behavior: Behavior normal.        Thought Content: Thought content normal.        Assessment & Plan:     ICD-10-CM   1. Melanoma of skin ([place])  C43.9       Pictures were obtained of the patient and placed in the chart with the patient's or guardian's permission.  Follow-up in 2 weeks.  Continue with KY dressings daily at the minimum and twice a day if possible.

## 2021-07-08 NOTE — Progress Notes (Signed)
Referring Provider Deland Pretty, MD [facility] [place],  [place] [address]   CC: No chief complaint on file.     Destiny [name] is an 75 y.o. female.  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  Patient was last seen 06/28/2021.  At that time, physical exam was reassuring and without evidence of infection.  Plan was for continued KY dressing changes daily, if not twice daily.  On my examination today, patient is accompanied by her husband was at bedside.  She states that she has been doing well.  Denies any pain symptoms, fevers, redness, swelling, streaking, or other symptoms.  She has been applying Adaptic in the morning with K-Y jelly and then adds additional K-Y jelly during dressing change at night.  She feels as though the wound has been improving and is pleased with the progress.   Allergies  Allergen Reactions   Aspirin Other (See Comments)    Stomach upset   Atorvastatin Other (See Comments)    cramps   Fentanyl Nausea Only   Lisinopril Other (See Comments)    dizziness   Penicillins Swelling    Has patient had a PCN reaction causing immediate rash, facial/tongue/throat swelling, SOB or lightheadedness with hypotension: Yes Has patient had a PCN reaction causing severe rash involving mucus membranes or skin necrosis: unknown Has patient had a PCN reaction that required hospitalization: No Has patient had a PCN reaction occurring within the last 10 years: No If all of the above answers are "NO", then may proceed with Cephalosporin use.    Hydrocodone Other (See Comments)    "Does not like how it makes me feel    Outpatient Encounter Medications as of 07/12/2021  Medication Sig Note   acetaminophen (TYLENOL) 500 MG tablet Take 1,000 mg by mouth every 6 (six) hours as needed for moderate pain.    aspirin EC 81 MG tablet Take 81 mg by mouth  in the morning.    Cholecalciferol (VITAMIN D3) 50 MCG (2000 UT) TABS Take 2,000 Units by mouth in the morning.    denosumab (PROLIA) 60 MG/ML SOSY injection Inject 60 mg into the skin every 6 (six) months. 03/22/2021: Last dose:01/2021    ezetimibe (ZETIA) 10 MG tablet Take 10 mg by mouth in the morning.    levothyroxine (SYNTHROID) 100 MCG tablet Take 100 mcg by mouth every morning.    metFORMIN (GLUCOPHAGE-XR) 500 MG 24 hr tablet Take 1,000 mg by mouth 2 (two) times daily.    Multiple Vitamins-Minerals (OCUVITE EXTRA) TABS Take 1 tablet by mouth in the morning and at bedtime.    OZEMPIC, 1 MG/DOSE, 4 MG/3ML SOPN Inject 1 mg into the skin every Friday.    pravastatin (PRAVACHOL) 40 MG tablet Take 40 mg by mouth every evening.    vitamin B-12 (CYANOCOBALAMIN) 1000 MCG tablet Take 1,000 mcg by mouth in the morning.    No facility-administered encounter medications on file as of 07/12/2021.     Past Medical History:  Diagnosis Date   Allergy    Ankle dislocation, left, initial encounter 09/23/2017   Arthritis    Blood transfusion    in 1980   Cancer ([place])    left heel melanoma   Diabetes mellitus    History of kidney stones    Hypothyroidism    Osteoporosis    osteopenia   PONV (postoperative nausea and vomiting)  Sleep apnea    does not use cpap   Thyroid disease    hypothyroidism    Past Surgical History:  Procedure Laterality Date   APPLICATION OF A-CELL OF CHEST/ABDOMEN Left 04/24/2021   Procedure: APPLICATION OF A-CELL LEFT FOOT;  Surgeon: Wallace Going, DO;  Location: [place];  Service: Plastics;  Laterality: Left;   APPLICATION OF A-CELL OF CHEST/ABDOMEN Left 05/22/2021   Procedure: POSSIBLE APPLICATION OF A-CELL;  Surgeon: Wallace Going, DO;  Location: [place];  Service: Plastics;  Laterality: Left;   COLONOSCOPY     EXCISION MELANOMA WITH SENTINEL LYMPH NODE BIOPSY Left 03/26/2021   Procedure: WIDE LOCAL EXCISION MELANOMA LEFT HEEL, SENTINEL LYMPH NODE  MAPPING AND BIOPSY;  Surgeon: [name], MD;  Location: [place];  Service: General;  Laterality: Left;   EXTERNAL FIXATION LEG Left 09/24/2017   Procedure: EXTERNAL FIXATION ankle;  Surgeon: Shona Needles, MD;  Location: [place];  Service: Orthopedics;  Laterality: Left;   EXTERNAL FIXATION REMOVAL Left 09/28/2017   Procedure: REMOVAL EXTERNAL FIXATION LEG;  Surgeon: Shona Needles, MD;  Location: [place];  Service: Orthopedics;  Laterality: Left;   EYE SURGERY Bilateral    cataract   LAPAROSCOPIC GASTRIC BANDING     MELANOMA EXCISION Left 04/24/2021   Procedure: REEXCISION MARGIN LEFT HEEL MELANOMA;  Surgeon: [name], MD;  Location: [place];  Service: General;  Laterality: Left;   MELANOMA EXCISION Left 05/22/2021   Procedure: RE-EXCISION OF LEFT HEEL MELANOMA;  Surgeon: [name], MD;  Location: [place];  Service: General;  Laterality: Left;   OPEN REDUCTION INTERNAL FIXATION (ORIF) DISTAL RADIAL FRACTURE Right 07/29/2016   Procedure: RIGHT OPEN REDUCTION INTERNAL FIXATION (ORIF) DISTAL RADIAL FRACTURE;  Surgeon: Leanora Cover, MD;  Location: [place];  Service: Orthopedics;  Laterality: Right;   ORIF ANKLE FRACTURE Left 09/28/2017   Procedure: OPEN REDUCTION INTERNAL FIXATION (ORIF) ANKLE FRACTURE;  Surgeon: Shona Needles, MD;  Location: [place];  Service: Orthopedics;  Laterality: Left;   POLYPECTOMY     SKIN DEBRIDEMENT Left 05/22/2021   Procedure: RECONSTRUCTION LEFT HEEL;  Surgeon: Wallace Going, DO;  Location: [place];  Service: Plastics;  Laterality: Left;   SKIN FULL THICKNESS GRAFT Left 04/24/2021   Procedure: RECONSTRUCTION OF LEFT HEEL;  Surgeon: Wallace Going, DO;  Location: [place];  Service: Plastics;  Laterality: Left;   SKIN FULL THICKNESS GRAFT Left 05/22/2021   Procedure: POSSIBLE SKIN GRAFT;  Surgeon: Wallace Going, DO;  Location: [place];  Service: Plastics;  Laterality: Left;   TOTAL ABDOMINAL HYSTERECTOMY W/ BILATERAL SALPINGOOPHORECTOMY      WISDOM TOOTH EXTRACTION      Family History  Problem Relation Age of Onset   Lung cancer Sister    Ovarian cancer Sister    Throat cancer Brother     Social History   Social History Narrative   Not on file     Review of Systems General: Denies fevers or chills Skin: Denies any worsening redness, swelling, or drainage  Physical Exam Vitals with BMI 05/22/2021 05/22/2021 05/22/2021  Height - - -  Weight - - -  BMI - - -  Systolic 540 086 761  Diastolic 70 70 68  Pulse 72 73 80    General:  No acute distress, nontoxic appearing  Respiratory: No increased work of breathing Neuro: Alert and oriented Psychiatric: Normal mood and affect      Assessment/Plan  Wound appears to be improving considerably  compared to prior encounters.  There is excellent granulation tissue.  The skin substitute that was placed is dark on periphery, but has taken integrated elsewhere quite well.  The darkening is likely reflective of it being dry.  Again discussed applying K-Y jelly liberally.  Patient has been excellent at home with their wound care.  Physical exam very reassuring without cellulitic changes.  Recommended continued Adaptic once daily and K-Y jelly dressing changes twice daily.  Asked that she follow-up in clinic in 2 to 3 weeks.  Discussed possible transition to collagen or endoform at that time.  Any pictures obtained of the patient and placed in the chart were with the patient's or guardian's permission.   Krista Blue 07/08/2021, 2:35 PM

## 2021-07-12 ENCOUNTER — Ambulatory Visit: Payer: PPO | Admitting: Physician Assistant

## 2021-07-12 ENCOUNTER — Other Ambulatory Visit: Payer: Self-pay

## 2021-07-12 DIAGNOSIS — C439 Malignant melanoma of skin, unspecified: Secondary | ICD-10-CM | POA: Diagnosis not present

## 2021-07-15 ENCOUNTER — Telehealth: Payer: Self-pay

## 2021-07-15 NOTE — Telephone Encounter (Signed)
Adv pt that she can call [facility] to request more Adaptic and they will ship it to her. Gave her phone # [phone]. She conveyed understanding.

## 2021-07-15 NOTE — Telephone Encounter (Signed)
Patient called to say she needs some adaptic.  Please call.

## 2021-07-22 DIAGNOSIS — L97412 Non-pressure chronic ulcer of right heel and midfoot with fat layer exposed: Secondary | ICD-10-CM | POA: Diagnosis not present

## 2021-07-23 DIAGNOSIS — M810 Age-related osteoporosis without current pathological fracture: Secondary | ICD-10-CM | POA: Diagnosis not present

## 2021-07-30 NOTE — Progress Notes (Signed)
Referring Provider Deland Pretty, MD [address] [address],  [place] [address]   CC:  Chief Complaint  Patient presents with   Follow-up      Destiny [name] is an 75 y.o. female.  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  Patient was last seen here in clinic on 07/12/2021.  At that time, she denied any concerning symptoms.  She had been applying Adaptic in the morning with K-Y jelly and then adding additional K-Y jelly during dressing change at night.  She was pleased with the progress.  Physical exam was largely reassuring.  There was excellent granulation tissue noted.  Skin substitute that was placed is dark on periphery, likely dry.  Elsewhere it was incorporating nicely.  Plan was for continued Adaptic once daily and K-Y jelly dressing changes twice daily.  Discussed possible transition to collagen or endoform at that time.  Today, she is accompanied by her husband at bedside.  They have been applying Adaptic with K-Y jelly twice daily.  She denies any fevers, pain, redness, swelling, or other concerning symptoms.  She is excited to finally transition out of the scooter once this is healed.  She is hoping to take a trip to [place] with her husband next year and wants this to be resolved.  She continues to avoid any ambulation in effort to speed up healing and mitigate risk of complication.   Allergies  Allergen Reactions   Aspirin Other (See Comments)    Stomach upset   Atorvastatin Other (See Comments)    cramps   Fentanyl Nausea Only   Lisinopril Other (See Comments)    dizziness   Penicillins Swelling    Has patient had a PCN reaction causing immediate rash, facial/tongue/throat swelling, SOB or lightheadedness with hypotension: Yes Has patient had a PCN reaction causing severe rash involving mucus membranes or skin necrosis: unknown Has patient  had a PCN reaction that required hospitalization: No Has patient had a PCN reaction occurring within the last 10 years: No If all of the above answers are "NO", then may proceed with Cephalosporin use.    Hydrocodone Other (See Comments)    "Does not like how it makes me feel    Outpatient Encounter Medications as of 08/02/2021  Medication Sig Note   acetaminophen (TYLENOL) 500 MG tablet Take 1,000 mg by mouth every 6 (six) hours as needed for moderate pain.    aspirin EC 81 MG tablet Take 81 mg by mouth in the morning.    Cholecalciferol (VITAMIN D3) 50 MCG (2000 UT) TABS Take 2,000 Units by mouth in the morning.    denosumab (PROLIA) 60 MG/ML SOSY injection Inject 60 mg into the skin every 6 (six) months. 03/22/2021: Last dose:01/2021    ezetimibe (ZETIA) 10 MG tablet Take 10 mg by mouth in the morning.    levothyroxine (SYNTHROID) 100 MCG tablet Take 100 mcg by mouth every morning.    metFORMIN (GLUCOPHAGE-XR) 500 MG 24 hr tablet Take 1,000 mg by mouth 2 (two) times daily.    Multiple Vitamins-Minerals (OCUVITE EXTRA) TABS Take 1 tablet by mouth in the morning and at bedtime.    OZEMPIC, 1 MG/DOSE, 4 MG/3ML SOPN Inject 1 mg into the skin every Friday.    pravastatin (PRAVACHOL) 40 MG tablet Take 40 mg by mouth every evening.    vitamin B-12 (CYANOCOBALAMIN) 1000 MCG tablet Take 1,000  mcg by mouth in the morning.    No facility-administered encounter medications on file as of 08/02/2021.     Past Medical History:  Diagnosis Date   Allergy    Ankle dislocation, left, initial encounter 09/23/2017   Arthritis    Blood transfusion    in 1980   Cancer ([place])    left heel melanoma   Diabetes mellitus    History of kidney stones    Hypothyroidism    Osteoporosis    osteopenia   PONV (postoperative nausea and vomiting)    Sleep apnea    does not use cpap   Thyroid disease    hypothyroidism    Past Surgical History:  Procedure Laterality Date   APPLICATION OF A-CELL OF  CHEST/ABDOMEN Left 04/24/2021   Procedure: APPLICATION OF A-CELL LEFT FOOT;  Surgeon: Wallace Going, DO;  Location: [place];  Service: Plastics;  Laterality: Left;   APPLICATION OF A-CELL OF CHEST/ABDOMEN Left 05/22/2021   Procedure: POSSIBLE APPLICATION OF A-CELL;  Surgeon: Wallace Going, DO;  Location: [facility];  Service: Plastics;  Laterality: Left;   COLONOSCOPY     EXCISION MELANOMA WITH SENTINEL LYMPH NODE BIOPSY Left 03/26/2021   Procedure: WIDE LOCAL EXCISION MELANOMA LEFT HEEL, SENTINEL LYMPH NODE MAPPING AND BIOPSY;  Surgeon: [name], MD;  Location: [place];  Service: General;  Laterality: Left;   EXTERNAL FIXATION LEG Left 09/24/2017   Procedure: EXTERNAL FIXATION ankle;  Surgeon: Shona Needles, MD;  Location: [facility];  Service: Orthopedics;  Laterality: Left;   EXTERNAL FIXATION REMOVAL Left 09/28/2017   Procedure: REMOVAL EXTERNAL FIXATION LEG;  Surgeon: Shona Needles, MD;  Location: [place];  Service: Orthopedics;  Laterality: Left;   EYE SURGERY Bilateral    cataract   LAPAROSCOPIC GASTRIC BANDING     MELANOMA EXCISION Left 04/24/2021   Procedure: REEXCISION MARGIN LEFT HEEL MELANOMA;  Surgeon: [name], MD;  Location: [facility];  Service: General;  Laterality: Left;   MELANOMA EXCISION Left 05/22/2021   Procedure: RE-EXCISION OF LEFT HEEL MELANOMA;  Surgeon: [name], MD;  Location: [facility];  Service: General;  Laterality: Left;   OPEN REDUCTION INTERNAL FIXATION (ORIF) DISTAL RADIAL FRACTURE Right 07/29/2016   Procedure: RIGHT OPEN REDUCTION INTERNAL FIXATION (ORIF) DISTAL RADIAL FRACTURE;  Surgeon: Leanora Cover, MD;  Location: [facility];  Service: Orthopedics;  Laterality: Right;   ORIF ANKLE FRACTURE Left 09/28/2017   Procedure: OPEN REDUCTION INTERNAL FIXATION (ORIF) ANKLE FRACTURE;  Surgeon: Shona Needles, MD;  Location: [facility];  Service: Orthopedics;  Laterality: Left;   POLYPECTOMY     SKIN DEBRIDEMENT Left 05/22/2021   Procedure:  RECONSTRUCTION LEFT HEEL;  Surgeon: Wallace Going, DO;  Location: [facility];  Service: Plastics;  Laterality: Left;   SKIN FULL THICKNESS GRAFT Left 04/24/2021   Procedure: RECONSTRUCTION OF LEFT HEEL;  Surgeon: Wallace Going, DO;  Location: [facility];  Service: Plastics;  Laterality: Left;   SKIN FULL THICKNESS GRAFT Left 05/22/2021   Procedure: POSSIBLE SKIN GRAFT;  Surgeon: Wallace Going, DO;  Location: [facility];  Service: Plastics;  Laterality: Left;   TOTAL ABDOMINAL HYSTERECTOMY W/ BILATERAL SALPINGOOPHORECTOMY     WISDOM TOOTH EXTRACTION      Family History  Problem Relation Age of Onset   Lung cancer Sister    Ovarian cancer Sister    Throat cancer Brother     Social History   Social History Narrative   Not on file  Review of Systems General: Denies fevers or chills Skin: Denies redness, swelling.  Physical Exam Vitals with BMI 05/22/2021 05/22/2021 05/22/2021  Height - - -  Weight - - -  BMI - - -  Systolic 035 248 185  Diastolic 70 70 68  Pulse 72 73 80    General:  No acute distress, nontoxic appearing  Respiratory: No increased work of breathing Neuro: Alert and oriented Psychiatric: Normal mood and affect  Skin: Advancing epithelization from wound edges.  Current wound only approximately 3 x 2 cm.  Tissue appears healthy.  Wound has shrunk considerably compared to prior encounter.  No induration, erythema, or other signs otherwise concerning for infection.  Nontender to touch.  Good capillary refill throughout.  Assessment/Plan  Wound shows considerable improvement compared to last encounter.  No cellulitic findings.  We will not make any changes given her excellent wound healing and advancing epithelization.  Continue with current management.  Anticipate that she may be able to transition off of the scooter in 3 weeks, particularly if she focuses on ambulating on the ball of her left foot rather than the heel.  Emphasized importance of high-protein  diet in interim.  Krista Blue 08/02/2021, 11:51 AM

## 2021-08-02 ENCOUNTER — Ambulatory Visit: Payer: PPO | Admitting: Physician Assistant

## 2021-08-02 ENCOUNTER — Other Ambulatory Visit: Payer: Self-pay

## 2021-08-02 DIAGNOSIS — C439 Malignant melanoma of skin, unspecified: Secondary | ICD-10-CM

## 2021-08-07 DIAGNOSIS — E039 Hypothyroidism, unspecified: Secondary | ICD-10-CM | POA: Diagnosis not present

## 2021-08-07 DIAGNOSIS — E11621 Type 2 diabetes mellitus with foot ulcer: Secondary | ICD-10-CM | POA: Diagnosis not present

## 2021-08-07 DIAGNOSIS — Z7982 Long term (current) use of aspirin: Secondary | ICD-10-CM | POA: Diagnosis not present

## 2021-08-07 DIAGNOSIS — I6529 Occlusion and stenosis of unspecified carotid artery: Secondary | ICD-10-CM | POA: Diagnosis not present

## 2021-08-07 DIAGNOSIS — E785 Hyperlipidemia, unspecified: Secondary | ICD-10-CM | POA: Diagnosis not present

## 2021-08-07 DIAGNOSIS — C4372 Malignant melanoma of left lower limb, including hip: Secondary | ICD-10-CM | POA: Diagnosis not present

## 2021-08-07 DIAGNOSIS — Z23 Encounter for immunization: Secondary | ICD-10-CM | POA: Diagnosis not present

## 2021-08-15 ENCOUNTER — Other Ambulatory Visit (HOSPITAL_COMMUNITY): Payer: Self-pay

## 2021-08-15 MED ORDER — OZEMPIC (2 MG/DOSE) 8 MG/3ML ~~LOC~~ SOPN
PEN_INJECTOR | SUBCUTANEOUS | 5 refills | Status: DC
  Filled 2021-08-15: qty 3, 28d supply, fill #0

## 2021-08-21 ENCOUNTER — Other Ambulatory Visit (HOSPITAL_COMMUNITY): Payer: Self-pay

## 2021-08-24 ENCOUNTER — Other Ambulatory Visit (HOSPITAL_COMMUNITY): Payer: Self-pay

## 2021-08-27 ENCOUNTER — Ambulatory Visit: Payer: PPO | Admitting: Physician Assistant

## 2021-08-27 ENCOUNTER — Other Ambulatory Visit: Payer: Self-pay

## 2021-08-27 ENCOUNTER — Other Ambulatory Visit (HOSPITAL_COMMUNITY): Payer: Self-pay

## 2021-08-27 DIAGNOSIS — C439 Malignant melanoma of skin, unspecified: Secondary | ICD-10-CM

## 2021-08-27 NOTE — Progress Notes (Signed)
Referring Provider Deland Pretty, MD [place] [place],  [place] [address]   CC: No chief complaint on file.     Destiny [name] is an 75 y.o. female.  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  Patient was last seen here in clinic on 08/02/2021.  At that time, she had been applying Adaptic with K-Y jelly twice daily.  She is still utilizing a scooter to get around.  Physical exam was reassuring, advancing epithelization from wound edges.  Wound was approximately 3 x 2 cm.  Recommendation was for continued management with Adaptic and K-Y jelly twice daily.  Encouraged high-protein diet.  Today, she is accompanied by her husband at bedside.  She states that she has been continuing to stay off of her foot and dressed the wound twice daily with Vaseline and Adaptic dressings.  She is pleased with the continued progress and is excited and hopeful that we can allow her to weight-bear moving forward.  She is tired of getting around on a scooter.  She denies any surrounding redness, streaking, pain, tenderness, drainage, fevers or chills, or other symptoms.  No new injuries or traumas.     Allergies  Allergen Reactions   Aspirin Other (See Comments)    Stomach upset   Atorvastatin Other (See Comments)    cramps   Fentanyl Nausea Only   Lisinopril Other (See Comments)    dizziness   Penicillins Swelling    Has patient had a PCN reaction causing immediate rash, facial/tongue/throat swelling, SOB or lightheadedness with hypotension: Yes Has patient had a PCN reaction causing severe rash involving mucus membranes or skin necrosis: unknown Has patient had a PCN reaction that required hospitalization: No Has patient had a PCN reaction occurring within the last 10 years: No If all of the above answers are "NO", then may proceed with Cephalosporin use.     Hydrocodone Other (See Comments)    "Does not like how it makes me feel    Outpatient Encounter Medications as of 08/27/2021  Medication Sig Note   acetaminophen (TYLENOL) 500 MG tablet Take 1,000 mg by mouth every 6 (six) hours as needed for moderate pain.    aspirin EC 81 MG tablet Take 81 mg by mouth in the morning.    Cholecalciferol (VITAMIN D3) 50 MCG (2000 UT) TABS Take 2,000 Units by mouth in the morning.    denosumab (PROLIA) 60 MG/ML SOSY injection Inject 60 mg into the skin every 6 (six) months. 03/22/2021: Last dose:01/2021    ezetimibe (ZETIA) 10 MG tablet Take 10 mg by mouth in the morning.    levothyroxine (SYNTHROID) 100 MCG tablet Take 100 mcg by mouth every morning.    metFORMIN (GLUCOPHAGE-XR) 500 MG 24 hr tablet Take 1,000 mg by mouth 2 (two) times daily.    Multiple Vitamins-Minerals (OCUVITE EXTRA) TABS Take 1 tablet by mouth in the morning and at bedtime.    OZEMPIC, 1 MG/DOSE, 4 MG/3ML SOPN Inject 1 mg into the skin every Friday.    pravastatin (PRAVACHOL) 40 MG tablet Take 40 mg by mouth every evening.    Semaglutide, 2 MG/DOSE, (OZEMPIC, 2 MG/DOSE,) 8 MG/3ML SOPN Inject 2 mg into the skin once weekly as directed    vitamin B-12 (CYANOCOBALAMIN) 1000 MCG tablet Take 1,000 mcg by mouth in the morning.    No facility-administered encounter medications on file as of  08/27/2021.     Past Medical History:  Diagnosis Date   Allergy    Ankle dislocation, left, initial encounter 09/23/2017   Arthritis    Blood transfusion    in 1980   Cancer ([place])    left heel melanoma   Diabetes mellitus    History of kidney stones    Hypothyroidism    Osteoporosis    osteopenia   PONV (postoperative nausea and vomiting)    Sleep apnea    does not use cpap   Thyroid disease    hypothyroidism    Past Surgical History:  Procedure Laterality Date   APPLICATION OF A-CELL OF CHEST/ABDOMEN Left 04/24/2021   Procedure: APPLICATION OF A-CELL LEFT FOOT;  Surgeon: Wallace Going, DO;  Location: [facility];  Service: Plastics;  Laterality: Left;   APPLICATION OF A-CELL OF CHEST/ABDOMEN Left 05/22/2021   Procedure: POSSIBLE APPLICATION OF A-CELL;  Surgeon: Wallace Going, DO;  Location: [facility];  Service: Plastics;  Laterality: Left;   COLONOSCOPY     EXCISION MELANOMA WITH SENTINEL LYMPH NODE BIOPSY Left 03/26/2021   Procedure: WIDE LOCAL EXCISION MELANOMA LEFT HEEL, SENTINEL LYMPH NODE MAPPING AND BIOPSY;  Surgeon: [name], MD;  Location: [facility];  Service: General;  Laterality: Left;   EXTERNAL FIXATION LEG Left 09/24/2017   Procedure: EXTERNAL FIXATION ankle;  Surgeon: Shona Needles, MD;  Location: [facility];  Service: Orthopedics;  Laterality: Left;   EXTERNAL FIXATION REMOVAL Left 09/28/2017   Procedure: REMOVAL EXTERNAL FIXATION LEG;  Surgeon: Shona Needles, MD;  Location: [facility];  Service: Orthopedics;  Laterality: Left;   EYE SURGERY Bilateral    cataract   LAPAROSCOPIC GASTRIC BANDING     MELANOMA EXCISION Left 04/24/2021   Procedure: REEXCISION MARGIN LEFT HEEL MELANOMA;  Surgeon: [name], MD;  Location: [facility];  Service: General;  Laterality: Left;   MELANOMA EXCISION Left 05/22/2021   Procedure: RE-EXCISION OF LEFT HEEL MELANOMA;  Surgeon: [name], MD;  Location: [facility];  Service: General;  Laterality: Left;   OPEN REDUCTION INTERNAL FIXATION (ORIF) DISTAL RADIAL FRACTURE Right 07/29/2016   Procedure: RIGHT OPEN REDUCTION INTERNAL FIXATION (ORIF) DISTAL RADIAL FRACTURE;  Surgeon: Leanora Cover, MD;  Location: [facility];  Service: Orthopedics;  Laterality: Right;   ORIF ANKLE FRACTURE Left 09/28/2017   Procedure: OPEN REDUCTION INTERNAL FIXATION (ORIF) ANKLE FRACTURE;  Surgeon: Shona Needles, MD;  Location: [facility];  Service: Orthopedics;  Laterality: Left;   POLYPECTOMY     SKIN DEBRIDEMENT Left 05/22/2021   Procedure: RECONSTRUCTION LEFT HEEL;  Surgeon: Wallace Going, DO;  Location: [facility];  Service: Plastics;   Laterality: Left;   SKIN FULL THICKNESS GRAFT Left 04/24/2021   Procedure: RECONSTRUCTION OF LEFT HEEL;  Surgeon: Wallace Going, DO;  Location: [facility];  Service: Plastics;  Laterality: Left;   SKIN FULL THICKNESS GRAFT Left 05/22/2021   Procedure: POSSIBLE SKIN GRAFT;  Surgeon: Wallace Going, DO;  Location: [facility];  Service: Plastics;  Laterality: Left;   TOTAL ABDOMINAL HYSTERECTOMY W/ BILATERAL SALPINGOOPHORECTOMY     WISDOM TOOTH EXTRACTION      Family History  Problem Relation Age of Onset   Lung cancer Sister    Ovarian cancer Sister    Throat cancer Brother     Social History   Social History Narrative   Not on file     Review of Systems General: Denies fevers or chills Skin: Endorses continued healing.  Denies worsening  redness, purulent drainage.  Physical Exam Vitals with BMI 05/22/2021 05/22/2021 05/22/2021  Height - - -  Weight - - -  BMI - - -  Systolic 573 220 254  Diastolic 70 70 68  Pulse 72 73 80    General:  No acute distress, nontoxic appearing  Respiratory: No increased work of breathing Skin: Complete epithelialization.  New skin appears dark compared to neighboring tissue, but nontender to palpation.  Good capillary refill.  No cellulitic findings noted.  See picture. Neuro: Alert and oriented Psychiatric: Normal mood and affect      Assessment/Plan  Patient has demonstrated continued wound healing.  Continually advancing epithelization.  At this time, looks to be largely healed.  No concerning findings.  Patient is functionally and neurovascular intact.  I feel as though it is reasonable for her to transition back to ambulation at this time.  Recommending that she wear supportive sneakers and ambulate on the ball of her left foot rather than on the heel.  Encouraged her to exercise caution as that can put her at risk for falls, particularly given her likely deconditioning.    No specific follow-up warranted at this time.  Recommended that  she just applied a film of Vaseline over the wound followed by an adhesive bandage/Band-Aid once daily for another few weeks for continued protection.  This can also be secured with a sock.  She can call the clinic should she develop any new or worsening symptoms and follow-up on an as-needed basis.  Picture(s) obtained of the patient and placed in the chart were with the patient's or guardian's permission.   Krista Blue 08/27/2021, 2:40 PM

## 2021-09-30 ENCOUNTER — Telehealth: Payer: Self-pay | Admitting: Plastic Surgery

## 2021-09-30 NOTE — Telephone Encounter (Signed)
Patient called and left voicemail wondering if someone could call her back. She said she has what seems like "bleeding under the skin of her foot". She would really like to speak with someone.  Please follow up with patient

## 2021-09-30 NOTE — Telephone Encounter (Signed)
Returned patients call. She had been seen for melanoma of the left foot since 04/09/21 in our office. Dr. Barry Dienes has excised this location 03/26/2021, 04/24/2021 and again 06/14/2021. Patient was having a stinging sensation at her left heal last night. When removing bandaged, she noted there appeared to be blood underneath the skin and puffy. The puffiness has dissipated, but underneath is still black. Donna Christen called patient, at this time there is no concern for the area on her heel. She is to call back if it gets worse, continue to apply Vaseline and gauze.

## 2021-10-02 ENCOUNTER — Telehealth: Payer: Self-pay | Admitting: Physician Assistant

## 2021-10-02 NOTE — Telephone Encounter (Signed)
The picture isn't entirely clear, but I'd like to take a look at it Friday to see if it is drainable.  Can you have the front add her to my schedule? I think that I had some mid-morning slots available.  In the interim, recommend that she avoid weightbearing on the affected heel and to elevate whenever possible.

## 2021-10-02 NOTE — Telephone Encounter (Signed)
Patient called and stated that she spoke with Dr. Donna Christen earlier this week regarding her foot. She said that it is still black and that she was going to try to send Dr. Donna Christen pictures through [facility] if he could take a look at them.   Dr. Donna Christen had mentioned her possibly coming in Friday if there was no improvement and she was hoping if he could check the pictures first, he could have a better judgement of whether she needs to be seen Friday in person.  Please follow up with patient.

## 2021-10-04 ENCOUNTER — Other Ambulatory Visit: Payer: Self-pay | Admitting: Physician Assistant

## 2021-10-04 ENCOUNTER — Ambulatory Visit: Payer: PPO | Admitting: Physician Assistant

## 2021-10-04 ENCOUNTER — Other Ambulatory Visit: Payer: Self-pay

## 2021-10-04 DIAGNOSIS — C439 Malignant melanoma of skin, unspecified: Secondary | ICD-10-CM

## 2021-10-04 MED ORDER — MUPIROCIN 2 % EX OINT: 1.0000 "application " | TOPICAL_OINTMENT | Freq: Two times a day (BID) | CUTANEOUS | 0 refills | Status: DC

## 2021-10-04 MED ORDER — MUPIROCIN CALCIUM 2 % EX CREA: TOPICAL_CREAM | Freq: Two times a day (BID) | CUTANEOUS | Status: DC

## 2021-10-04 MED ORDER — MUPIROCIN CALCIUM 2 % EX CREA: 1.0000 "application " | TOPICAL_CREAM | Freq: Two times a day (BID) | CUTANEOUS | 0 refills | Status: DC

## 2021-10-04 NOTE — Progress Notes (Signed)
Referring Provider Deland Pretty, MD [place],  [place] [id number]   CC:  Chief Complaint  Patient presents with   Skin Problem      Destiny [name] is an 75 y.o. female.  HPI: Patient is a 75 year old female with past medical history of reexcision of left heel melanoma performed 05/22/2021 who returns to clinic after developing left heel discomfort and blood blister.  She was last seen here in clinic on 08/27/2021.  At that time, she had made considerable progress and was tired of requiring a scooter.  Given that there was complete epithelialization and exam was otherwise reassuring, plan was for her to transition back to ambulation.  Recommended that she wear supportive sneakers and ambulate on the ball of her left foot rather than on the heel until she makes continued progress.  No specific follow-up was needed.  She returns because the other night she felt a "stinging" discomfort in her left heel and upon waking the following day found a large black appearing area.  She suspects blood blister.  She does endorse walking around on her heel a bit more than usual the day prior.  She denies any ongoing pain symptoms, but given this new development was concerned for a setback in her recovery.    On exam patient is accompanied by her son was at bedside.  She feels as though it is starting to get a little bit better.  She denies any pain, redness, obvious precipitating trauma, worsening of the blackish appearing blood blister, or other symptoms.    Allergies  Allergen Reactions   Aspirin Other (See Comments)    Stomach upset   Atorvastatin Other (See Comments)    cramps   Fentanyl Nausea Only   Lisinopril Other (See Comments)    dizziness   Penicillins Swelling    Has patient had a PCN reaction causing immediate rash, facial/tongue/throat swelling, SOB or lightheadedness with hypotension: Yes Has patient had a PCN reaction causing severe rash involving mucus  membranes or skin necrosis: unknown Has patient had a PCN reaction that required hospitalization: No Has patient had a PCN reaction occurring within the last 10 years: No If all of the above answers are "NO", then may proceed with Cephalosporin use.    Hydrocodone Other (See Comments)    "Does not like how it makes me feel    Outpatient Encounter Medications as of 10/04/2021  Medication Sig Note   acetaminophen (TYLENOL) 500 MG tablet Take 1,000 mg by mouth every 6 (six) hours as needed for moderate pain.    aspirin EC 81 MG tablet Take 81 mg by mouth in the morning.    Cholecalciferol (VITAMIN D3) 50 MCG (2000 UT) TABS Take 2,000 Units by mouth in the morning.    denosumab (PROLIA) 60 MG/ML SOSY injection Inject 60 mg into the skin every 6 (six) months. 03/22/2021: Last dose:01/2021    ezetimibe (ZETIA) 10 MG tablet Take 10 mg by mouth in the morning.    levothyroxine (SYNTHROID) 100 MCG tablet Take 100 mcg by mouth every morning.    metFORMIN (GLUCOPHAGE-XR) 500 MG 24 hr tablet Take 1,000 mg by mouth 2 (two) times daily.    Multiple Vitamins-Minerals (OCUVITE EXTRA) TABS Take 1 tablet by mouth in the morning and at bedtime.    mupirocin ointment (BACTROBAN) 2 % Apply 1 application topically 2 (two) times daily.    OZEMPIC, 1 MG/DOSE, 4 MG/3ML SOPN Inject 1 mg into the skin every  Friday.    pravastatin (PRAVACHOL) 40 MG tablet Take 40 mg by mouth every evening.    Semaglutide, 2 MG/DOSE, (OZEMPIC, 2 MG/DOSE,) 8 MG/3ML SOPN Inject 2 mg into the skin once weekly as directed    vitamin B-12 (CYANOCOBALAMIN) 1000 MCG tablet Take 1,000 mcg by mouth in the morning.    [DISCONTINUED] mupirocin cream (BACTROBAN) 2 %     No facility-administered encounter medications on file as of 10/04/2021.     Past Medical History:  Diagnosis Date   Allergy    Ankle dislocation, left, initial encounter 09/23/2017   Arthritis    Blood transfusion    in 1980   Cancer ([place])    left heel melanoma    Diabetes mellitus    History of kidney stones    Hypothyroidism    Osteoporosis    osteopenia   PONV (postoperative nausea and vomiting)    Sleep apnea    does not use cpap   Thyroid disease    hypothyroidism    Past Surgical History:  Procedure Laterality Date   APPLICATION OF A-CELL OF CHEST/ABDOMEN Left 04/24/2021   Procedure: APPLICATION OF A-CELL LEFT FOOT;  Surgeon: Wallace Going, DO;  Location: [facility];  Service: Plastics;  Laterality: Left;   APPLICATION OF A-CELL OF CHEST/ABDOMEN Left 05/22/2021   Procedure: POSSIBLE APPLICATION OF A-CELL;  Surgeon: Wallace Going, DO;  Location: [facility];  Service: Plastics;  Laterality: Left;   COLONOSCOPY     EXCISION MELANOMA WITH SENTINEL LYMPH NODE BIOPSY Left 03/26/2021   Procedure: WIDE LOCAL EXCISION MELANOMA LEFT HEEL, SENTINEL LYMPH NODE MAPPING AND BIOPSY;  Surgeon: [name], MD;  Location: [facility];  Service: General;  Laterality: Left;   EXTERNAL FIXATION LEG Left 09/24/2017   Procedure: EXTERNAL FIXATION ankle;  Surgeon: Shona Needles, MD;  Location: [facility];  Service: Orthopedics;  Laterality: Left;   EXTERNAL FIXATION REMOVAL Left 09/28/2017   Procedure: REMOVAL EXTERNAL FIXATION LEG;  Surgeon: Shona Needles, MD;  Location: [facility];  Service: Orthopedics;  Laterality: Left;   EYE SURGERY Bilateral    cataract   LAPAROSCOPIC GASTRIC BANDING     MELANOMA EXCISION Left 04/24/2021   Procedure: REEXCISION MARGIN LEFT HEEL MELANOMA;  Surgeon: [name], MD;  Location: [facility];  Service: General;  Laterality: Left;   MELANOMA EXCISION Left 05/22/2021   Procedure: RE-EXCISION OF LEFT HEEL MELANOMA;  Surgeon: [name], MD;  Location: [facility];  Service: General;  Laterality: Left;   OPEN REDUCTION INTERNAL FIXATION (ORIF) DISTAL RADIAL FRACTURE Right 07/29/2016   Procedure: RIGHT OPEN REDUCTION INTERNAL FIXATION (ORIF) DISTAL RADIAL FRACTURE;  Surgeon: Leanora Cover, MD;  Location: [facility];  Service:  Orthopedics;  Laterality: Right;   ORIF ANKLE FRACTURE Left 09/28/2017   Procedure: OPEN REDUCTION INTERNAL FIXATION (ORIF) ANKLE FRACTURE;  Surgeon: Shona Needles, MD;  Location: [facility];  Service: Orthopedics;  Laterality: Left;   POLYPECTOMY     SKIN DEBRIDEMENT Left 05/22/2021   Procedure: RECONSTRUCTION LEFT HEEL;  Surgeon: Wallace Going, DO;  Location: [facility];  Service: Plastics;  Laterality: Left;   SKIN FULL THICKNESS GRAFT Left 04/24/2021   Procedure: RECONSTRUCTION OF LEFT HEEL;  Surgeon: Wallace Going, DO;  Location: [facility];  Service: Plastics;  Laterality: Left;   SKIN FULL THICKNESS GRAFT Left 05/22/2021   Procedure: POSSIBLE SKIN GRAFT;  Surgeon: Wallace Going, DO;  Location: [facility];  Service: Plastics;  Laterality: Left;   TOTAL ABDOMINAL  HYSTERECTOMY W/ BILATERAL SALPINGOOPHORECTOMY     WISDOM TOOTH EXTRACTION      Family History  Problem Relation Age of Onset   Lung cancer Sister    Ovarian cancer Sister    Throat cancer Brother     Social History   Social History Narrative   Not on file     Review of Systems General: Denies fevers or chills MSK: Denies difficulty ambulating Skin: Endorses blisterlike formation  Physical Exam Vitals with BMI 05/22/2021 05/22/2021 05/22/2021  Height - - -  Weight - - -  BMI - - -  Systolic 601 093 235  Diastolic 70 70 68  Pulse 72 73 80    General:  No acute distress, nontoxic appearing  Respiratory: No increased work of breathing Neuro: Alert and oriented Psychiatric: Normal mood and affect  Skin: Small, 3.5 x 5 cm blister with old appearing blood just underneath the skin.  Skin is lifted, mild tenting.  Moderate amount of blood is appreciated immediately deep to the skin.  Surrounding skin looks healthy.  Scar is otherwise improving.  Assessment/Plan  Discussed case with Dr. Marla Roe.  Given obvious blood blister, made decision to create a small incision to express the old blood and allow for wound  healing.  The area was cleaned well with alcohol swabs x2.  A very small, less than 1 cm incision was made using #10 blade.  While remaining superficial, the incision still allowed for immediate expression of old, dark blood.  I was able to continue expressing the remaining blood from just underneath the skin out through the incision.  Given that this incision was made over the most anterior aspect of the blood blister on plantar aspect of heel, applied bacitracin followed by adhesive bandage.  Emphasized the importance of keeping the area clean and covered.  She will avoid ambulation on the heel and instead focus on walking on the balls of her feet for the next week.  Suspect that this blood blister was the result of accidental shearing injury, perhaps related to shoes that may not fit well or if she had been pivoting on her heel.  Patient will apply mupirocin ointment over the incision x1 week to prophylactically mitigate risk of infection.  She will continue to keep it covered with a Band-Aid and clean it with soap and water daily.  She will call the clinic should she develop any new or worsening symptoms.  No specific follow-up warranted.   Krista Blue 10/04/2021, 12:42 PM

## 2022-01-06 DIAGNOSIS — E785 Hyperlipidemia, unspecified: Secondary | ICD-10-CM | POA: Diagnosis not present

## 2022-01-06 DIAGNOSIS — E039 Hypothyroidism, unspecified: Secondary | ICD-10-CM | POA: Diagnosis not present

## 2022-01-06 DIAGNOSIS — M810 Age-related osteoporosis without current pathological fracture: Secondary | ICD-10-CM | POA: Diagnosis not present

## 2022-01-06 DIAGNOSIS — E11610 Type 2 diabetes mellitus with diabetic neuropathic arthropathy: Secondary | ICD-10-CM | POA: Diagnosis not present

## 2022-01-09 ENCOUNTER — Other Ambulatory Visit: Payer: Self-pay | Admitting: Internal Medicine

## 2022-01-09 DIAGNOSIS — E039 Hypothyroidism, unspecified: Secondary | ICD-10-CM | POA: Diagnosis not present

## 2022-01-09 DIAGNOSIS — Z0000 Encounter for general adult medical examination without abnormal findings: Secondary | ICD-10-CM | POA: Diagnosis not present

## 2022-01-09 DIAGNOSIS — R319 Hematuria, unspecified: Secondary | ICD-10-CM | POA: Diagnosis not present

## 2022-01-09 DIAGNOSIS — E1140 Type 2 diabetes mellitus with diabetic neuropathy, unspecified: Secondary | ICD-10-CM | POA: Diagnosis not present

## 2022-01-09 DIAGNOSIS — I6529 Occlusion and stenosis of unspecified carotid artery: Secondary | ICD-10-CM

## 2022-01-09 DIAGNOSIS — H9193 Unspecified hearing loss, bilateral: Secondary | ICD-10-CM | POA: Diagnosis not present

## 2022-01-09 DIAGNOSIS — M810 Age-related osteoporosis without current pathological fracture: Secondary | ICD-10-CM | POA: Diagnosis not present

## 2022-01-09 DIAGNOSIS — Z23 Encounter for immunization: Secondary | ICD-10-CM | POA: Diagnosis not present

## 2022-01-16 DIAGNOSIS — H16143 Punctate keratitis, bilateral: Secondary | ICD-10-CM | POA: Diagnosis not present

## 2022-01-16 DIAGNOSIS — Z961 Presence of intraocular lens: Secondary | ICD-10-CM | POA: Diagnosis not present

## 2022-01-16 DIAGNOSIS — H04123 Dry eye syndrome of bilateral lacrimal glands: Secondary | ICD-10-CM | POA: Diagnosis not present

## 2022-01-16 DIAGNOSIS — H26491 Other secondary cataract, right eye: Secondary | ICD-10-CM | POA: Diagnosis not present

## 2022-01-16 DIAGNOSIS — H16213 Exposure keratoconjunctivitis, bilateral: Secondary | ICD-10-CM | POA: Diagnosis not present

## 2022-01-16 DIAGNOSIS — E119 Type 2 diabetes mellitus without complications: Secondary | ICD-10-CM | POA: Diagnosis not present

## 2022-01-16 DIAGNOSIS — H02831 Dermatochalasis of right upper eyelid: Secondary | ICD-10-CM | POA: Diagnosis not present

## 2022-01-16 DIAGNOSIS — H02834 Dermatochalasis of left upper eyelid: Secondary | ICD-10-CM | POA: Diagnosis not present

## 2022-01-16 DIAGNOSIS — H353132 Nonexudative age-related macular degeneration, bilateral, intermediate dry stage: Secondary | ICD-10-CM | POA: Diagnosis not present

## 2022-01-28 DIAGNOSIS — Z1212 Encounter for screening for malignant neoplasm of rectum: Secondary | ICD-10-CM | POA: Diagnosis not present

## 2022-01-28 DIAGNOSIS — M810 Age-related osteoporosis without current pathological fracture: Secondary | ICD-10-CM | POA: Diagnosis not present

## 2022-01-28 DIAGNOSIS — Z1211 Encounter for screening for malignant neoplasm of colon: Secondary | ICD-10-CM | POA: Diagnosis not present

## 2022-02-05 LAB — COLOGUARD: COLOGUARD: POSITIVE — AB

## 2022-02-07 ENCOUNTER — Encounter: Payer: Self-pay | Admitting: Gastroenterology

## 2022-02-10 ENCOUNTER — Ambulatory Visit
Admission: RE | Admit: 2022-02-10 | Discharge: 2022-02-10 | Disposition: A | Discharge status: Home / Self Care | Payer: No Typology Code available for payment source | Source: Ambulatory Visit | Attending: Internal Medicine | Admitting: Internal Medicine

## 2022-02-10 DIAGNOSIS — I6529 Occlusion and stenosis of unspecified carotid artery: Secondary | ICD-10-CM

## 2022-02-10 IMAGING — CT CT CARDIAC CORONARY ARTERY CALCIUM SCORE
3 series · 14 of 20 positions shown, 16 images · non-contrast
Comparison: None Available.

CLINICAL DATA: 75-year-old white female

EXAM:
CT CARDIAC CORONARY ARTERY CALCIUM SCORE
TECHNIQUE: Non-contrast imaging through the heart was performed using
prospective ECG gating. Image post processing was performed on an
independent workstation, allowing for quantitative analysis of the
heart and coronary arteries. Note that this exam targets the heart
and the chest was not imaged in its entirety.

[Series 2: calcium scoring 2.00 qr36 bestdiast 74% hrt calciu · axial · 0.44mm/px · z∈[+1505,+1577]mm · 4 of 60 slices shown]
[im 12/60  vessel]
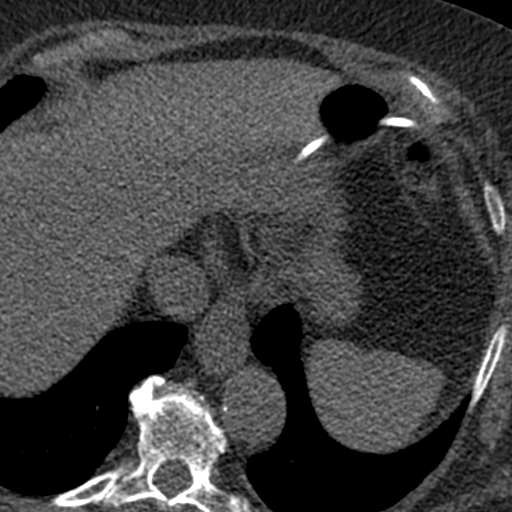
[im 24/60  vessel]
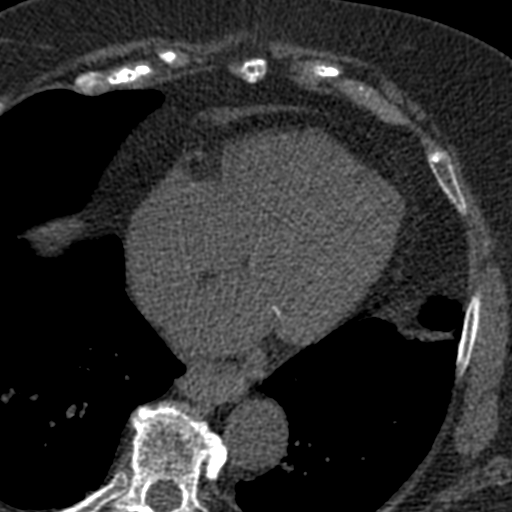
[im 36/60  vessel]
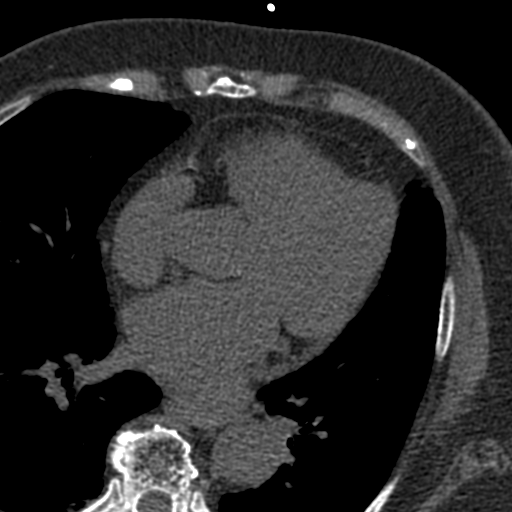
[im 48/60  vessel]
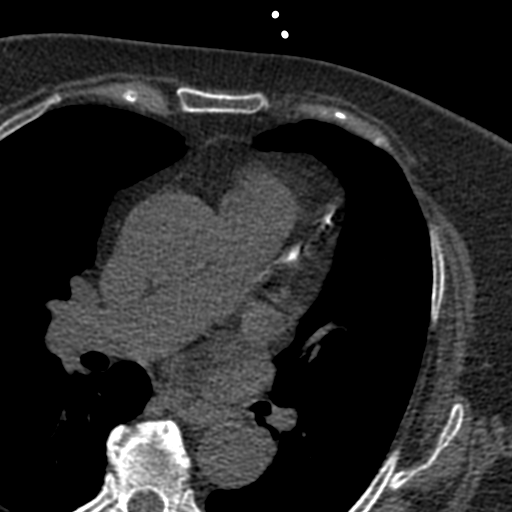

[Series 3: calcium scoring 2.00 br40 bestdiast 74% axial · axial · 0.58mm/px · z∈[+1501,+1581]mm · 5 of 60 slices shown, 7 images]
[im 10/60  vessel]
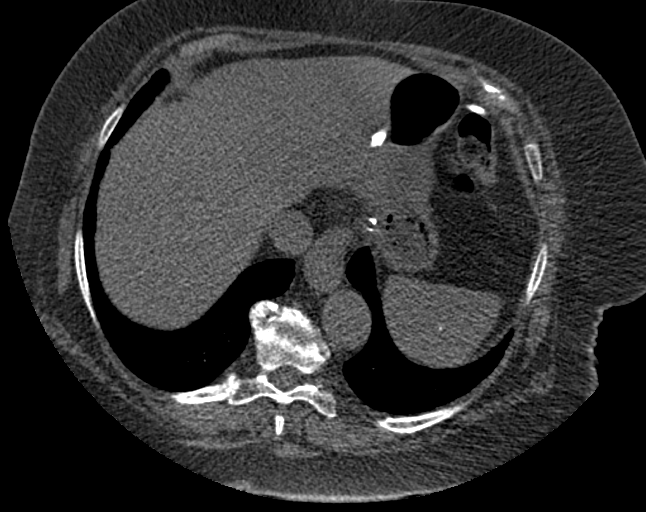
[im 10/60  lung]
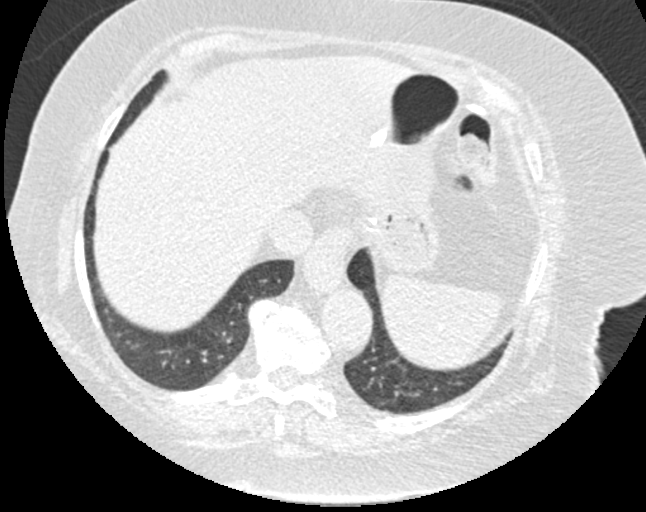
[im 20/60  vessel]
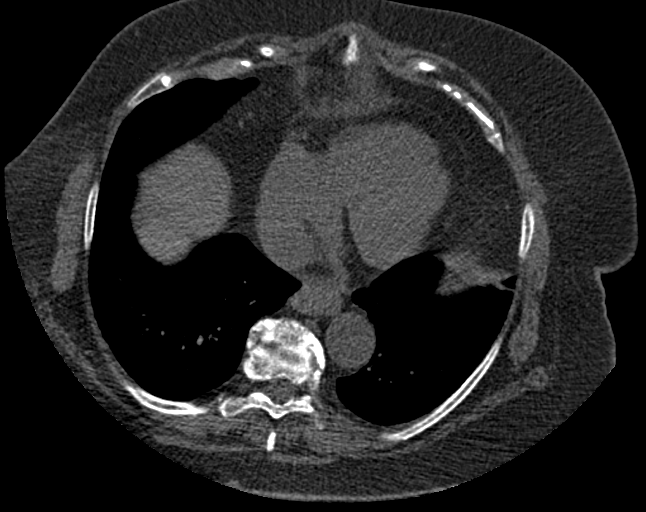
[im 30/60  vessel]
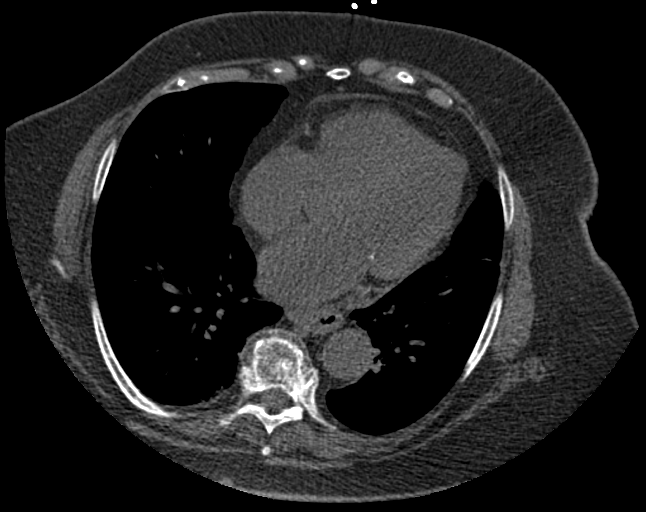
[im 40/60  vessel]
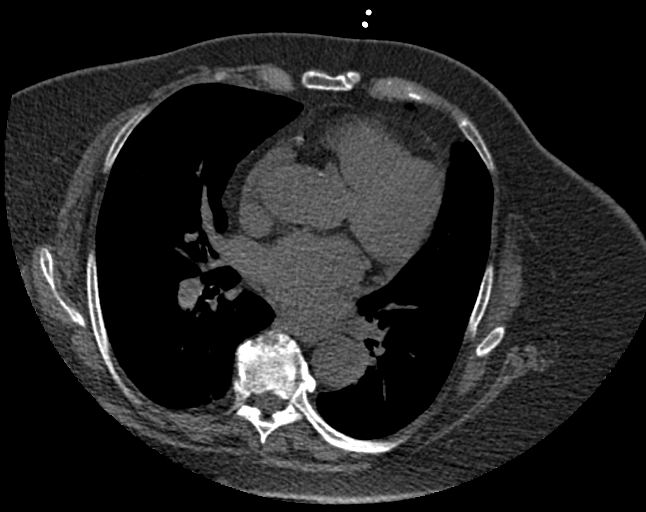
[im 50/60  vessel]
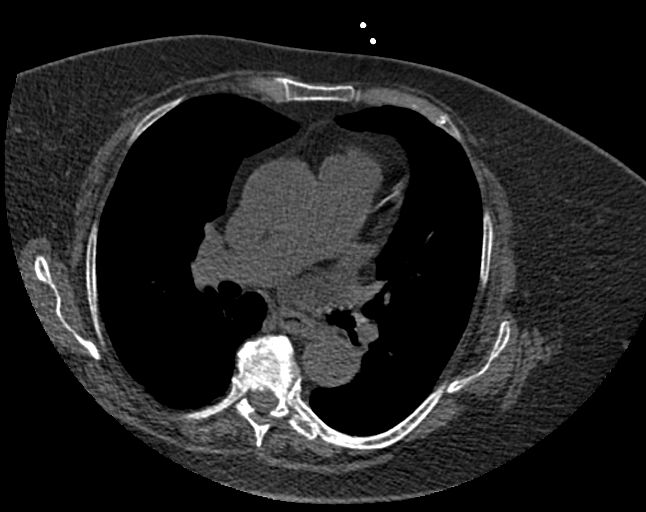
[im 50/60  lung]
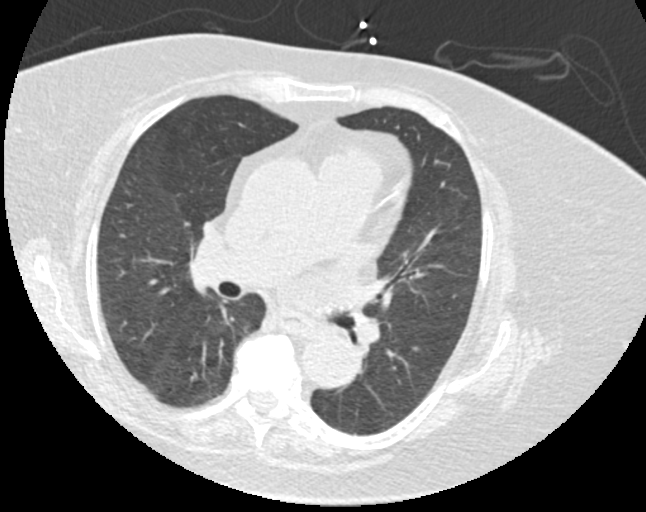

[Series 9: calcium scoring 2.00 br60 bestdiast 74% lungs · axial · 0.58mm/px · z∈[+1501,+1581]mm · 5 of 60 slices shown]
[im 10/60  vessel]
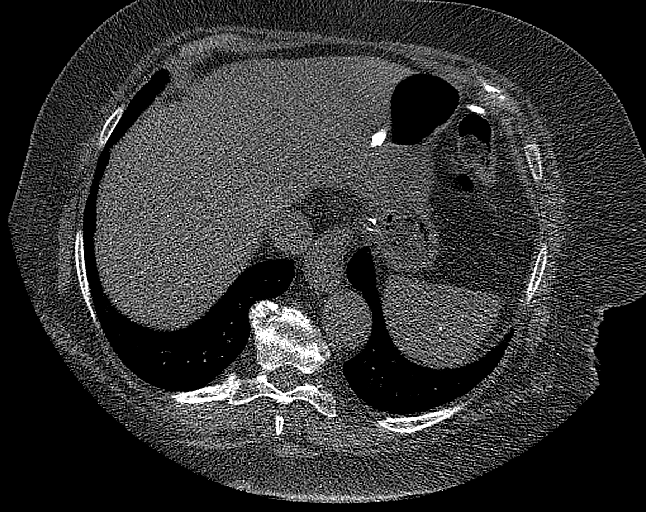
[im 20/60  vessel]
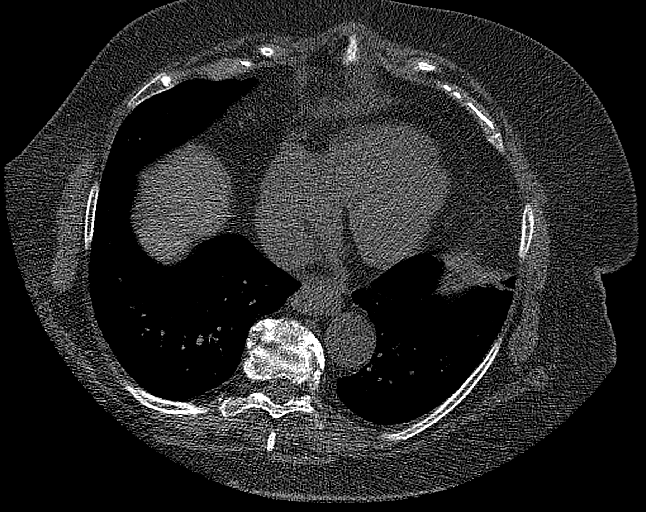
[im 30/60  vessel]
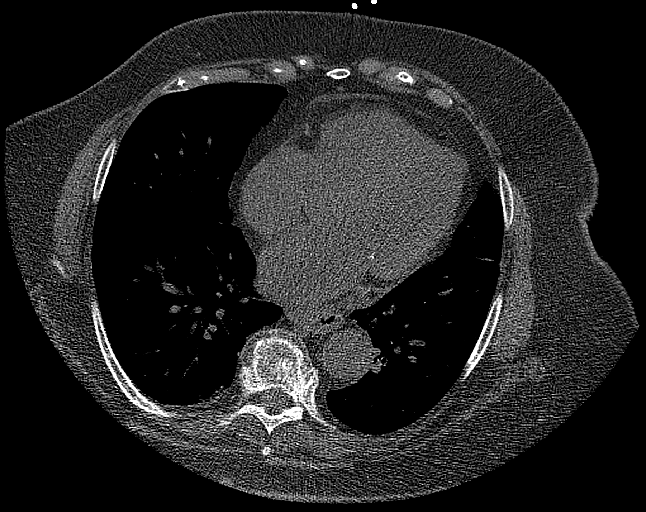
[im 40/60  vessel]
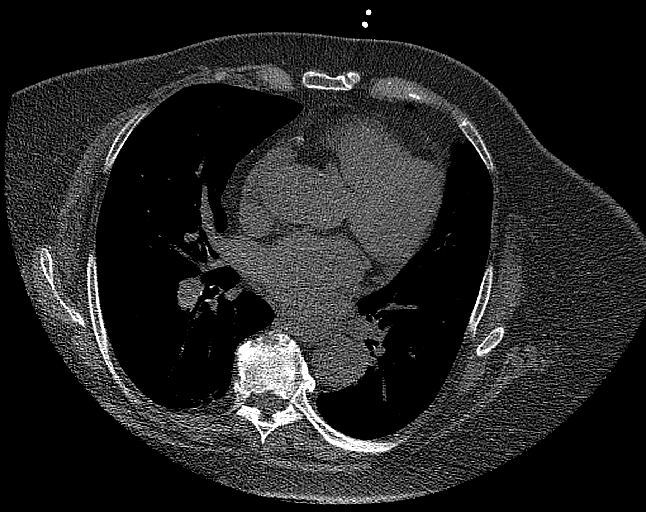
[im 50/60  vessel]
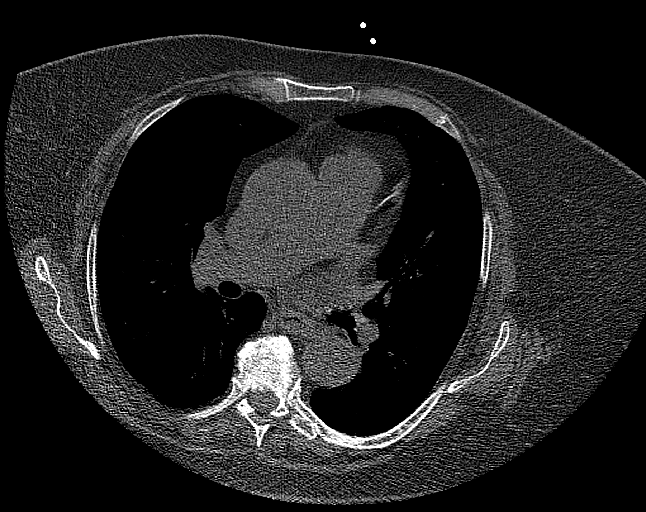

[14 of 20 positions shown; findings below may reference images not displayed]

FINDINGS: CORONARY CALCIUM SCORES:

Left Main: 0

LAD: 653

LCx:

RCA:

Total Agatston Score: 666, somewhat limited due to motion artifact.

[HOSPITAL] percentile: 88th

AORTA MEASUREMENTS:

Ascending Aorta: 3.9 mm

Descending Aorta: 32 mm

OTHER FINDINGS:

Vascular: Normal heart size, trace pericardial fluid. Ascending
thoracic aorta is upper limits of normal in size with calcific
atherosclerotic disease.

Mediastinum/Nodes: Small hiatal hernia. No pathologically enlarged
lymph nodes seen in the chest.

Lungs/Pleura: Central airways are patent. Right basilar atelectasis.
Linear opacities of the left lower lung, likely due to scarring or
atelectasis. No consolidation, pleural effusion or pneumothorax.

Upper Abdomen: Partially visualized gastric band device. No acute
abnormalities.

Musculoskeletal: No chest wall mass or suspicious bone lesions
identified.
IMPRESSION: 1. Total Agatston Score: 666
2.  Aortic Atherosclerosis ([5S]-[5S]).

## 2022-02-18 DIAGNOSIS — I2510 Atherosclerotic heart disease of native coronary artery without angina pectoris: Secondary | ICD-10-CM | POA: Diagnosis not present

## 2022-02-20 ENCOUNTER — Ambulatory Visit: Payer: HMO | Admitting: Internal Medicine

## 2022-02-20 ENCOUNTER — Encounter: Payer: Self-pay | Admitting: Internal Medicine

## 2022-02-20 VITALS — BP 118/64 | HR 83 | Ht 63.5 in | Wt 187.2 lb

## 2022-02-20 DIAGNOSIS — I2510 Atherosclerotic heart disease of native coronary artery without angina pectoris: Secondary | ICD-10-CM

## 2022-02-20 DIAGNOSIS — I2584 Coronary atherosclerosis due to calcified coronary lesion: Secondary | ICD-10-CM | POA: Diagnosis not present

## 2022-02-20 NOTE — Patient Instructions (Signed)
Medication Instructions:  ?Your Physician recommend you continue on your current medication as directed.   ? ?*If you need a refill on your cardiac medications before your next appointment, please call your pharmacy* ? ? ?Lab Work: ?None ordered today ? ? ?Testing/Procedures: ?None ordered today ? ? ?Follow-Up: ?At [facility], you and your health needs are our priority.  As part of our continuing mission to provide you with exceptional heart care, we have created designated Provider Care Teams.  These Care Teams include your primary Cardiologist (physician) and Advanced Practice Providers (APPs -  Physician Assistants and Nurse Practitioners) who all work together to provide you with the care you need, when you need it. ? ?We recommend signing up for the patient portal called "MyChart".  Sign up information is provided on this After Visit Summary.  MyChart is used to connect with patients for Virtual Visits (Telemedicine).  Patients are able to view lab/test results, encounter notes, upcoming appointments, etc.  Non-urgent messages can be sent to your provider as well.   ?To learn more about what you can do with MyChart, go to [email or web address].   ? ?Your next appointment:   ?6 month(s) ? ?The format for your next appointment:   ?In Person ? ?Provider:   ?Janina Mayo, MD { ? ? ?Important Information About Sugar ? ? ? ? ? ? ?

## 2022-02-20 NOTE — Progress Notes (Signed)
?Cardiology Office Note:   ? ?Date:  02/20/2022  ? ?ID:  Destiny [name], DOB 29-Aug-1946, MRN [id number] ? ?PCP:  Deland Pretty, MD ?  ?[facility] Providers ?Cardiologist:  Janina Mayo, MD    ? ?Referring MD: Deland Pretty, MD  ? ?No chief complaint on file. ?High CAC ? ?History of Present Illness:   ? ?Destiny [name] is a 76 y.o. female with a hx of DM2, OSA -not using cpap, melanoma, referral for CAC 666, 88th percentile ? ?She denies SOB or chest pressure. She takes care of herself. She volunteers on Mondays. She does shopping. She drives cars for auction. No limitations.  ? ?Former smoker for 15-20 years. Has not smoked for many years ? ?She has normal blood pressure.  ? ?No claudication. She has neuropathy.  ? ?A1c ~ 5.7. Don't have recent labs ? ?Cardiology Studies: ?01/01/2011-No significant carotid stenosis BL < 50% ? ?11/27/2020-toe-brachial abnormal BL. Right .67 , left .52  ? ?Past Medical History:  ?Diagnosis Date  ? Allergy   ? Ankle dislocation, left, initial encounter 09/23/2017  ? Arthritis   ? Blood transfusion   ? in 1980  ? Cancer [facility])   ? left heel melanoma  ? Diabetes mellitus   ? History of kidney stones   ? Hypothyroidism   ? Osteoporosis   ? osteopenia  ? PONV (postoperative nausea and vomiting)   ? Sleep apnea   ? does not use cpap  ? Thyroid disease   ? hypothyroidism  ? ? ?Past Surgical History:  ?Procedure Laterality Date  ? APPLICATION OF A-CELL OF CHEST/ABDOMEN Left 04/24/2021  ? Procedure: APPLICATION OF A-CELL LEFT FOOT;  Surgeon: Wallace Going, DO;  Location: [place];  Service: Plastics;  Laterality: Left;  ? APPLICATION OF A-CELL OF CHEST/ABDOMEN Left 05/22/2021  ? Procedure: POSSIBLE APPLICATION OF A-CELL;  Surgeon: Wallace Going, DO;  Location: [place];  Service: Plastics;  Laterality: Left;  ? COLONOSCOPY    ? EXCISION MELANOMA WITH SENTINEL LYMPH NODE BIOPSY Left 03/26/2021  ? Procedure: WIDE LOCAL EXCISION MELANOMA LEFT HEEL, SENTINEL LYMPH NODE MAPPING AND  BIOPSY;  Surgeon: [name], MD;  Location: [place];  Service: General;  Laterality: Left;  ? EXTERNAL FIXATION LEG Left 09/24/2017  ? Procedure: EXTERNAL FIXATION ankle;  Surgeon: Shona Needles, MD;  Location: [place];  Service: Orthopedics;  Laterality: Left;  ? EXTERNAL FIXATION REMOVAL Left 09/28/2017  ? Procedure: REMOVAL EXTERNAL FIXATION LEG;  Surgeon: Shona Needles, MD;  Location: [place];  Service: Orthopedics;  Laterality: Left;  ? EYE SURGERY Bilateral   ? cataract  ? LAPAROSCOPIC GASTRIC BANDING    ? MELANOMA EXCISION Left 04/24/2021  ? Procedure: REEXCISION MARGIN LEFT HEEL MELANOMA;  Surgeon: [name], MD;  Location: [place];  Service: General;  Laterality: Left;  ? MELANOMA EXCISION Left 05/22/2021  ? Procedure: RE-EXCISION OF LEFT HEEL MELANOMA;  Surgeon: [name], MD;  Location: [place];  Service: General;  Laterality: Left;  ? OPEN REDUCTION INTERNAL FIXATION (ORIF) DISTAL RADIAL FRACTURE Right 07/29/2016  ? Procedure: RIGHT OPEN REDUCTION INTERNAL FIXATION (ORIF) DISTAL RADIAL FRACTURE;  Surgeon: Leanora Cover, MD;  Location: [place];  Service: Orthopedics;  Laterality: Right;  ? ORIF ANKLE FRACTURE Left 09/28/2017  ? Procedure: OPEN REDUCTION INTERNAL FIXATION (ORIF) ANKLE FRACTURE;  Surgeon: Shona Needles, MD;  Location: [place];  Service: Orthopedics;  Laterality: Left;  ? POLYPECTOMY    ? SKIN DEBRIDEMENT Left 05/22/2021  ?  Procedure: RECONSTRUCTION LEFT HEEL;  Surgeon: Wallace Going, DO;  Location: [place];  Service: Plastics;  Laterality: Left;  ? SKIN FULL THICKNESS GRAFT Left 04/24/2021  ? Procedure: RECONSTRUCTION OF LEFT HEEL;  Surgeon: Wallace Going, DO;  Location: [place];  Service: Plastics;  Laterality: Left;  ? SKIN FULL THICKNESS GRAFT Left 05/22/2021  ? Procedure: POSSIBLE SKIN GRAFT;  Surgeon: Wallace Going, DO;  Location: [place];  Service: Plastics;  Laterality: Left;  ? TOTAL ABDOMINAL HYSTERECTOMY W/ BILATERAL SALPINGOOPHORECTOMY    ? WISDOM  TOOTH EXTRACTION    ? ? ?Current Medications: ?Current Meds  ?Medication Sig  ? acetaminophen (TYLENOL) 500 MG tablet Take 1,000 mg by mouth every 6 (six) hours as needed for moderate pain.  ? aspirin EC 81 MG tablet Take 81 mg by mouth in the morning.  ? Cholecalciferol (VITAMIN D3) 50 MCG (2000 UT) TABS Take 2,000 Units by mouth in the morning.  ? denosumab (PROLIA) 60 MG/ML SOSY injection Inject 60 mg into the skin every 6 (six) months.  ? ezetimibe (ZETIA) 10 MG tablet Take 10 mg by mouth in the morning.  ? levothyroxine (SYNTHROID) 100 MCG tablet Take 100 mcg by mouth every morning.  ? metFORMIN (GLUCOPHAGE-XR) 500 MG 24 hr tablet Take 1,000 mg by mouth 2 (two) times daily.  ? Multiple Vitamins-Minerals (OCUVITE EXTRA) TABS Take 1 tablet by mouth in the morning and at bedtime.  ? ONETOUCH ULTRA test strip   ? OZEMPIC, 1 MG/DOSE, 4 MG/3ML SOPN Inject 1 mg into the skin every Friday.  ? pravastatin (PRAVACHOL) 40 MG tablet Take 40 mg by mouth every evening.  ? Semaglutide, 2 MG/DOSE, (OZEMPIC, 2 MG/DOSE,) 8 MG/3ML SOPN Inject 2 mg into the skin once weekly as directed  ? vitamin B-12 (CYANOCOBALAMIN) 1000 MCG tablet Take 1,000 mcg by mouth in the morning.  ?  ? ?Allergies:   Aspirin, Atorvastatin, Fentanyl, Lisinopril, Penicillins, and Hydrocodone  ? ?Social History  ? ?Socioeconomic History  ? Marital status: Divorced  ?  Spouse name: Not on file  ? Number of children: Not on file  ? Years of education: Not on file  ? Highest education level: Not on file  ?Occupational History  ? Not on file  ?Tobacco Use  ? Smoking status: Former  ? Smokeless tobacco: Never  ?Vaping Use  ? Vaping Use: Never used  ?Substance and Sexual Activity  ? Alcohol use: No  ? Drug use: No  ? Sexual activity: Never  ?Other Topics Concern  ? Not on file  ?Social History Narrative  ? Not on file  ? ?Social Determinants of Health  ? ?Financial Resource Strain: Not on file  ?Food Insecurity: Not on file  ?Transportation Needs: Not on file   ?Physical Activity: Not on file  ?Stress: Not on file  ?Social Connections: Not on file  ?  ? ?Family History: ?The patient's family history includes Lung cancer in her sister; Ovarian cancer in her sister; Throat cancer in her brother. ? ?ROS:   ?Please see the history of present illness.    ? All other systems reviewed and are negative. ? ?EKGs/Labs/Other Studies Reviewed:   ? ?The following studies were reviewed today: ? ? ?EKG:  EKG is  ordered today.  The ekg ordered today demonstrates  ? ?02/20/2022-NSR ? ?Recent Labs: ?03/26/2021: ALT 9; Hemoglobin 12.6; Platelets 247 ?05/14/2021: BUN 12; Creatinine, Ser 0.90; Potassium 4.3; Sodium 141  ?Recent Lipid Panel ?No results found for: CHOL, TRIG,  HDL, CHOLHDL, VLDL, LDLCALC, LDLDIRECT ? ? ?Risk Assessment/Calculations:   ?  ? ?    ? ?Physical Exam:   ? ?VS:  BP 118/64   Pulse 83   Ht 5' 3.5" (1.613 m)   Wt 187 lb 3.2 oz (84.9 kg)   SpO2 97%   BMI 32.64 kg/m?    ? ?Wt Readings from Last 3 Encounters:  ?02/20/22 187 lb 3.2 oz (84.9 kg)  ?05/22/21 198 lb (89.8 kg)  ?05/17/21 198 lb (89.8 kg)  ?  ? ?GEN:  Well nourished, well developed in no acute distress ?HEENT: Normal ?NECK: No JVD; No carotid bruits ?LYMPHATICS: No lymphadenopathy ?CARDIAC: RRR, no murmurs, rubs, gallops ?RESPIRATORY:  Clear to auscultation without rales, wheezing or rhonchi  ?ABDOMEN: Soft, non-tender, non-distended ?MUSCULOSKELETAL:  No edema; No deformity  ?SKIN: Warm and dry ?NEUROLOGIC:  Alert and oriented x 3 ?PSYCHIATRIC:  Normal affect  ? ?ASSESSMENT:   ? ?Elevated CAC: goal is to CVD risk mitigate. She is asymptomatic. Will continue asa, statin and zetia. I don't have her recent lipid studies. Goal LDL < 70 mg/dL. Otherwise  Recommend to continue with lifestyle modification and CVD risk mitigation (yearly A1c, lipid monitoring); otherwise no plans for an ischemic evaluation with no symptoms ? ?PAD ?-continue asa ?-continue pravastatin 40 mg daily ?-continue zetia 10 mg daily ? ?PLAN:    ? ?In order of problems listed above ? ?If recent LDL was < 70 mg/dL , no changes recommended ?Follow up 6 months ? ?   ? ?   ? ? ?Medication Adjustments/Labs and Tests Ordered: ?Current medicines are reviewed at len

## 2022-02-27 ENCOUNTER — Encounter: Payer: Self-pay | Admitting: Gastroenterology

## 2022-02-27 ENCOUNTER — Ambulatory Visit (INDEPENDENT_AMBULATORY_CARE_PROVIDER_SITE_OTHER): Payer: HMO | Admitting: Gastroenterology

## 2022-02-27 VITALS — BP 124/78 | HR 87 | Ht 63.0 in | Wt 187.0 lb

## 2022-02-27 DIAGNOSIS — Z9889 Other specified postprocedural states: Secondary | ICD-10-CM

## 2022-02-27 DIAGNOSIS — R112 Nausea with vomiting, unspecified: Secondary | ICD-10-CM | POA: Diagnosis not present

## 2022-02-27 DIAGNOSIS — R195 Other fecal abnormalities: Secondary | ICD-10-CM | POA: Diagnosis not present

## 2022-02-27 MED ORDER — ONDANSETRON HCL 4 MG PO TABS: ORAL_TABLET | ORAL | 1 refills | Status: DC

## 2022-02-27 MED ORDER — NA SULFATE-K SULFATE-MG SULF 17.5-3.13-1.6 GM/177ML PO SOLN: 1.0000 | Freq: Once | ORAL | 0 refills | Status: AC

## 2022-02-27 NOTE — Progress Notes (Signed)
Referring Provider: Deland Pretty, MD Primary Care Physician:  Deland Pretty, MD  Reason for Consultation: Positive Cologuard   IMPRESSION:  Positive Cologuard Intermittent bright red blood on the toilet paper Normal hemoglobin 03/2021 PONV with prior general anesthesia Prior colonoscopy 2012 with pancolonic diverticulitis and a rectal hyperplastic polyp Family history of colon polyps (mother)  PLAN: Colonoscopy with MAC to evaluate the positive Cologuard Will give Zofran prior to her procedure to minimize nausea and vomiting  HPI: Destiny [name] is a 76 y.o. female referred by Dr. Concha Pyo for a positive Cologuard.  Patient referred for colonoscopy for possible Cologuard.  She has a history of diabetes, thyroid dysfunction, kidney stones, obesity, sleep apnea not using CPAP, melanoma, osteoporosis.  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  She had a positive Cologuard 01/28/22. Intermittent red blood on the toilet paper.   Normal hemoglobin 03/26/21 12.6  No symptoms associated with the anemia except for some morning headaches.  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   No fatigue, weakness, irritability, exertional dyspnea, vertigo, or angina pectoris.  No pica.  No beeturia.  No hearing loss.    No melena or hematochezia. No epistaxis, vaginal bleeding, hemoptysis, or hematuria.   Longstanding history of diarrhea related to metformin that resolved with a switch to metformin XR. GI ROS is otherwise negative.   Colonoscopy with Dr. Sharlett Iles 02/24/2011 revealed pancolonic diverticulosis and a rectal hyperplastic polyp.  Flexible sigmoidoscopy with Dr. Terance Hart 12/19/2013 for possible rectal polyp revealed internal hemorrhoids   Mother with colon polyps. There is no other known family history of colon cancer or polyps. No family history of stomach cancer or other GI malignancy. No family history of inflammatory bowel disease or celiac.     Past Medical History:  Diagnosis Date   Allergy    Ankle dislocation, left, initial encounter 09/23/2017   Arthritis    Blood transfusion    in 1980   Cancer ([place])    left heel melanoma   Diabetes mellitus    History of kidney stones    Hypothyroidism    Osteoporosis    osteopenia   PONV (postoperative nausea and vomiting)    Sleep apnea    does not use cpap   Thyroid disease    hypothyroidism    Past Surgical History:  Procedure Laterality Date   APPLICATION OF A-CELL OF CHEST/ABDOMEN Left 04/24/2021   Procedure: APPLICATION OF A-CELL LEFT FOOT;  Surgeon: Wallace Going, DO;  Location: [place];  Service: Plastics;  Laterality: Left;   APPLICATION OF A-CELL OF CHEST/ABDOMEN Left 05/22/2021   Procedure: POSSIBLE APPLICATION OF A-CELL;  Surgeon: Wallace Going, DO;  Location: [place];  Service: Plastics;  Laterality: Left;   COLONOSCOPY     EXCISION MELANOMA WITH SENTINEL LYMPH NODE BIOPSY Left 03/26/2021   Procedure: WIDE LOCAL EXCISION MELANOMA LEFT HEEL, SENTINEL LYMPH NODE MAPPING AND BIOPSY;  Surgeon: [name], MD;  Location: [place];  Service: General;  Laterality: Left;   EXTERNAL FIXATION LEG Left 09/24/2017   Procedure: EXTERNAL FIXATION ankle;  Surgeon: Shona Needles, MD;  Location: [place];  Service: Orthopedics;  Laterality: Left;   EXTERNAL FIXATION REMOVAL Left 09/28/2017   Procedure: REMOVAL EXTERNAL FIXATION LEG;  Surgeon: Shona Needles, MD;  Location: [place];  Service: Orthopedics;  Laterality: Left;   EYE SURGERY Bilateral    cataract   LAPAROSCOPIC GASTRIC BANDING     MELANOMA EXCISION Left 04/24/2021  Procedure: REEXCISION MARGIN LEFT HEEL MELANOMA;  Surgeon: [name], MD;  Location: [place];  Service: General;  Laterality: Left;   MELANOMA EXCISION Left 05/22/2021   Procedure: RE-EXCISION OF LEFT HEEL MELANOMA;  Surgeon: [name], MD;  Location: [place];  Service: General;  Laterality: Left;   OPEN REDUCTION INTERNAL FIXATION (ORIF)  DISTAL RADIAL FRACTURE Right 07/29/2016   Procedure: RIGHT OPEN REDUCTION INTERNAL FIXATION (ORIF) DISTAL RADIAL FRACTURE;  Surgeon: Leanora Cover, MD;  Location: [place];  Service: Orthopedics;  Laterality: Right;   ORIF ANKLE FRACTURE Left 09/28/2017   Procedure: OPEN REDUCTION INTERNAL FIXATION (ORIF) ANKLE FRACTURE;  Surgeon: Shona Needles, MD;  Location: [place];  Service: Orthopedics;  Laterality: Left;   POLYPECTOMY     SKIN DEBRIDEMENT Left 05/22/2021   Procedure: RECONSTRUCTION LEFT HEEL;  Surgeon: Wallace Going, DO;  Location: [place];  Service: Plastics;  Laterality: Left;   SKIN FULL THICKNESS GRAFT Left 04/24/2021   Procedure: RECONSTRUCTION OF LEFT HEEL;  Surgeon: Wallace Going, DO;  Location: [place];  Service: Plastics;  Laterality: Left;   SKIN FULL THICKNESS GRAFT Left 05/22/2021   Procedure: POSSIBLE SKIN GRAFT;  Surgeon: Wallace Going, DO;  Location: [place];  Service: Plastics;  Laterality: Left;   TOTAL ABDOMINAL HYSTERECTOMY W/ BILATERAL SALPINGOOPHORECTOMY     WISDOM TOOTH EXTRACTION       Current Outpatient Medications  Medication Sig Dispense Refill   acetaminophen (TYLENOL) 500 MG tablet Take 1,000 mg by mouth every 6 (six) hours as needed for moderate pain.     aspirin EC 81 MG tablet Take 81 mg by mouth in the morning.     Cholecalciferol (VITAMIN D3) 50 MCG (2000 UT) TABS Take 2,000 Units by mouth in the morning.     denosumab (PROLIA) 60 MG/ML SOSY injection Inject 60 mg into the skin every 6 (six) months.     ezetimibe (ZETIA) 10 MG tablet Take 10 mg by mouth in the morning.     levothyroxine (SYNTHROID) 100 MCG tablet Take 100 mcg by mouth every morning.     metFORMIN (GLUCOPHAGE-XR) 500 MG 24 hr tablet Take 1,000 mg by mouth 2 (two) times daily.  2   Multiple Vitamins-Minerals (OCUVITE EXTRA) TABS Take 1 tablet by mouth in the morning and at bedtime.     ONETOUCH ULTRA test strip      OZEMPIC, 1 MG/DOSE, 4 MG/3ML SOPN Inject 1  mg into the skin every Friday.     pravastatin (PRAVACHOL) 40 MG tablet Take 40 mg by mouth every evening.     Semaglutide, 2 MG/DOSE, (OZEMPIC, 2 MG/DOSE,) 8 MG/3ML SOPN Inject 2 mg into the skin once weekly as directed 3 mL 5   vitamin B-12 (CYANOCOBALAMIN) 1000 MCG tablet Take 1,000 mcg by mouth in the morning.     No current facility-administered medications for this visit.    Allergies as of 02/27/2022 - Review Complete 02/27/2022  Allergen Reaction Noted   Aspirin Other (See Comments) 01/08/2021   Atorvastatin Other (See Comments) 01/08/2021   Fentanyl Nausea Only 01/08/2021   Lisinopril Other (See Comments) 01/08/2021   Penicillins Swelling 07/25/2016   Hydrocodone Other (See Comments) 03/25/2021    Family History  Problem Relation Age of Onset   Lung cancer Sister    Ovarian cancer Sister    Throat cancer Brother     Social History   Socioeconomic History   Marital status: Divorced    Spouse name: Not on  file   Number of children: Not on file   Years of education: Not on file   Highest education level: Not on file  Occupational History   Not on file  Tobacco Use   Smoking status: Former   Smokeless tobacco: Never  Vaping Use   Vaping Use: Never used  Substance and Sexual Activity   Alcohol use: No   Drug use: No   Sexual activity: Never  Other Topics Concern   Not on file  Social History Narrative   Not on file   Social Determinants of Health   Financial Resource Strain: Not on file  Food Insecurity: Not on file  Transportation Needs: Not on file  Physical Activity: Not on file  Stress: Not on file  Social Connections: Not on file  Intimate Partner Violence: Not on file    Review of Systems: 12 system ROS is negative except as noted above.   Physical Exam: General:   Alert,  well-nourished, pleasant and cooperative in NAD Head:  Normocephalic and atraumatic. Eyes:  Sclera clear, no icterus.   Conjunctiva pink. Ears:  Normal auditory  acuity. Nose:  No deformity, discharge,  or lesions. Mouth:  No deformity or lesions.   Neck:  Supple; no masses or thyromegaly. Lungs:  Clear throughout to auscultation.   No wheezes. Heart:  Regular rate and rhythm; no murmurs. Abdomen:  Soft, nontender, nondistended, normal bowel sounds, no rebound or guarding. No hepatosplenomegaly.   Rectal:  Deferred  Msk:  Symmetrical. No boney deformities LAD: No inguinal or umbilical LAD Extremities:  No clubbing or edema. Neurologic:  Alert and  oriented x4;  grossly nonfocal Skin:  Intact without significant lesions or rashes. Psych:  Alert and cooperative. Normal mood and affect.  I spent over 45 minutes, including in depth chart review, independent review of results, face-to-face time with the patient, coordinating care, ordering studies and medications as appropriate, and documentation.    Annalissa Murphey L. Tarri Glenn, MD, MPH 02/27/2022, 3:20 PM

## 2022-02-27 NOTE — Patient Instructions (Addendum)
It was my pleasure to provide care to you today. Based on our discussion, I am providing you with my recommendations below:  RECOMMENDATION(S):   PRESCRIPTION MEDICATION(S):   We have sent the following medication(s) to your pharmacy:  Zofran 4 mg ODT take 1 tablet 30 minutes before drinking prep.  NOTE: If your medication(s) requires a PRIOR AUTHORIZATION, we will receive notification from your pharmacy. Once received, the process to submit for approval may take up to 7-10 business days. You will be contacted about any denials we have received from your insurance company as well as alternatives recommended by your provider.   COLONOSCOPY:   You have been scheduled for a colonoscopy. Please follow written instructions given to you at your visit today.   PREP:   Please pick up your prep supplies at the pharmacy within the next 1-3 days.  INHALERS:   If you use inhalers (even only as needed), please bring them with you on the day of your procedure.  COLONOSCOPY TIPS:  To reduce nausea and dehydration, stay well hydrated for 3-4 days prior to the exam.  To prevent skin/hemorrhoid irritation - prior to wiping, put A&Dointment or vaseline on the toilet paper. Keep a towel or pad on the bed.  BEFORE STARTING YOUR PREP, drink  64oz of clear liquids in the morning. This will help to flush the colon and will ensure you are well hydrated!!!!  NOTE - This is in addition to the fluids required for to complete your prep. Use of a flavored hard candy, such as grape Anise Salvo, can counteract some of the flavor of the prep and may prevent some nausea.    FOLLOW UP:  After your procedure, you will receive a call from my office staff regarding my recommendation for follow up.  BMI:  If you are age 78 or older, your body mass index should be between 23-30. Your Body mass index is 33.13 kg/m. If this is out of the aforementioned range listed, please consider follow up with your Primary Care  Provider.  If you are age 62 or younger, your body mass index should be between 19-25. Your Body mass index is 33.13 kg/m. If this is out of the aformentioned range listed, please consider follow up with your Primary Care Provider.   MY CHART:  The [place] [facility] providers would like to encourage you to use [facility] to communicate with providers for non-urgent requests or questions.  Due to long hold times on the telephone, sending your provider a message by [facility] may be a faster and more efficient way to get a response.  Please allow 48 business hours for a response.  Please remember that this is for non-urgent requests.   Thank you for trusting me with your gastrointestinal care!    Thornton Park, MD, MPH

## 2022-03-06 ENCOUNTER — Other Ambulatory Visit: Payer: Self-pay

## 2022-03-06 ENCOUNTER — Other Ambulatory Visit (INDEPENDENT_AMBULATORY_CARE_PROVIDER_SITE_OTHER): Payer: HMO

## 2022-03-06 ENCOUNTER — Encounter: Payer: Self-pay | Admitting: Gastroenterology

## 2022-03-06 ENCOUNTER — Ambulatory Visit (AMBULATORY_SURGERY_CENTER): Payer: HMO | Admitting: Gastroenterology

## 2022-03-06 ENCOUNTER — Other Ambulatory Visit: Payer: Self-pay | Admitting: Gastroenterology

## 2022-03-06 VITALS — BP 129/71 | HR 62 | Temp 97.1°F | Resp 12 | Ht 63.0 in | Wt 187.0 lb

## 2022-03-06 DIAGNOSIS — Z1211 Encounter for screening for malignant neoplasm of colon: Secondary | ICD-10-CM | POA: Diagnosis not present

## 2022-03-06 DIAGNOSIS — E119 Type 2 diabetes mellitus without complications: Secondary | ICD-10-CM | POA: Diagnosis not present

## 2022-03-06 DIAGNOSIS — K648 Other hemorrhoids: Secondary | ICD-10-CM

## 2022-03-06 DIAGNOSIS — K5730 Diverticulosis of large intestine without perforation or abscess without bleeding: Secondary | ICD-10-CM | POA: Diagnosis not present

## 2022-03-06 DIAGNOSIS — K6289 Other specified diseases of anus and rectum: Secondary | ICD-10-CM

## 2022-03-06 DIAGNOSIS — R195 Other fecal abnormalities: Secondary | ICD-10-CM

## 2022-03-06 DIAGNOSIS — G4733 Obstructive sleep apnea (adult) (pediatric): Secondary | ICD-10-CM | POA: Diagnosis not present

## 2022-03-06 DIAGNOSIS — C20 Malignant neoplasm of rectum: Secondary | ICD-10-CM

## 2022-03-06 LAB — COMPREHENSIVE METABOLIC PANEL
ALT: 7 U/L (ref 0–35)
AST: 12 U/L (ref 0–37)
Albumin: 4.2 g/dL (ref 3.5–5.2)
Alkaline Phosphatase: 51 U/L (ref 39–117)
BUN: 13 mg/dL (ref 6–23)
CO2: 32 mEq/L (ref 19–32)
Calcium: 9.1 mg/dL (ref 8.4–10.5)
Chloride: 104 mEq/L (ref 96–112)
Creatinine, Ser: 0.95 mg/dL (ref 0.40–1.20)
GFR: 57.69 mL/min — ABNORMAL LOW (ref >60.00)
Glucose, Bld: 85 mg/dL (ref 70–99)
Potassium: 4.9 mEq/L (ref 3.5–5.1)
Sodium: 143 mEq/L (ref 135–145)
Total Bilirubin: 0.6 mg/dL (ref 0.2–1.2)
Total Protein: 6.9 g/dL (ref 6.0–8.3)

## 2022-03-06 LAB — CBC WITH DIFFERENTIAL/PLATELET
Basophils Absolute: 0 10*3/uL (ref 0.0–0.1)
Basophils Relative: 0.5 % (ref 0.0–3.0)
Eosinophils Absolute: 0.2 10*3/uL (ref 0.0–0.7)
Eosinophils Relative: 3.1 % (ref 0.0–5.0)
HCT: 36.1 % (ref 36.0–46.0)
Hemoglobin: 11.6 g/dL — ABNORMAL LOW (ref 12.0–15.0)
Lymphocytes Relative: 35.2 % (ref 12.0–46.0)
Lymphs Abs: 2 10*3/uL (ref 0.7–4.0)
MCHC: 32.1 g/dL (ref 30.0–36.0)
MCV: 84.9 fl (ref 78.0–100.0)
Monocytes Absolute: 0.3 10*3/uL (ref 0.1–1.0)
Monocytes Relative: 6 % (ref 3.0–12.0)
Neutro Abs: 3.1 10*3/uL (ref 1.4–7.7)
Neutrophils Relative %: 55.2 % (ref 43.0–77.0)
Platelets: 278 10*3/uL (ref 150.0–400.0)
RBC: 4.25 Mil/uL (ref 3.87–5.11)
RDW: 15.3 % (ref 11.5–15.5)
WBC: 5.7 10*3/uL (ref 4.0–10.5)

## 2022-03-06 MED ORDER — SODIUM CHLORIDE 0.9 % IV SOLN: 500.0000 mL | Freq: Once | INTRAVENOUS | Status: DC

## 2022-03-06 NOTE — Progress Notes (Signed)
Indications for colonoscopy: Positive Cologuard  Please see my 02/27/2022 office note for complete details.  There is been no change in history or physical exam since that time.  The patient remains an appropriate candidate for monitored anesthesia care in the endoscopy center.

## 2022-03-06 NOTE — Op Note (Signed)
[place] Patient Name: Destiny [name] Procedure Date: 03/06/2022 10:33 AM MRN: [id number] Endoscopist: Thornton Park MD, MD Age: 76 Referring MD:  Date of Birth: 1946/03/02 Gender: Female Account #: [id number] Procedure:                Colonoscopy Indications:              Positive Cologuard test, Rectal bleeding Medicines:                Monitored Anesthesia Care Procedure:                Pre-Anesthesia Assessment:                           - Prior to the procedure, a History and Physical                            was performed, and patient medications and                            allergies were reviewed. The patient's tolerance of                            previous anesthesia was also reviewed. The risks                            and benefits of the procedure and the sedation                            options and risks were discussed with the patient.                            All questions were answered, and informed consent                            was obtained. Prior Anticoagulants: The patient has                            taken no previous anticoagulant or antiplatelet                            agents. ASA Grade Assessment: II - A patient with                            mild systemic disease. After reviewing the risks                            and benefits, the patient was deemed in                            satisfactory condition to undergo the procedure.                           After obtaining informed consent, the colonoscope  was passed under direct vision. Throughout the                            procedure, the patient's blood pressure, pulse, and                            oxygen saturations were monitored continuously. The                            CF [id number] #[id number] was introduced through the anus                            and advanced to the the cecum, identified by                            appendiceal  orifice and ileocecal valve. The                            colonoscopy was performed without difficulty. The                            patient tolerated the procedure well. The quality                            of the bowel preparation was good. The ileocecal                            valve, appendiceal orifice, and rectum were                            photographed. Scope In: 10:41:00 AM Scope Out: 11:03:59 AM Scope Withdrawal Time: 0 hours 17 minutes 29 seconds  Total Procedure Duration: 0 hours 22 minutes 59 seconds  Findings:                 The perianal and digital rectal examinations were                            normal.                           Non-bleeding internal hemorrhoids were found.                           Multiple small and large-mouthed diverticula were                            found in the entire colon.                           An ulcerated non-obstructing mass was found in the                            rectum. This is located 12 cm from the anal verge.  No bleeding was present. This was biopsied with a                            cold forceps for histology. Area was tattooed with                            an injection of 3 mL of Spot (carbon black). Complications:            No immediate complications. Estimated Blood Loss:     Estimated blood loss was minimal. Impression:               - Non-bleeding internal hemorrhoids.                           - Diverticulosis in the entire examined colon.                           - Rule out malignancy, tumor in the rectum.                            Biopsied. Tattooed. Recommendation:           - Patient has a contact number available for                            emergencies. The signs and symptoms of potential                            delayed complications were discussed with the                            patient. Return to normal activities tomorrow.                             Written discharge instructions were provided to the                            patient.                           - Resume previous diet.                           - Continue present medications.                           - Await pathology results.                           - CMP, CBC, CEA.                           - CT abd/pelvis with contrast. Thornton Park MD, MD 03/06/2022 11:16:25 AM This report has been signed electronically.

## 2022-03-06 NOTE — Patient Instructions (Signed)
Handouts on hemorrhoids and diverticulosis given to you today  CT to be scheduled, contrast and instructions sent home with you today    YOU HAD AN ENDOSCOPIC PROCEDURE TODAY AT [facility]:   Refer to the procedure report that was given to you for any specific questions about what was found during the examination.  If the procedure report does not answer your questions, please call your gastroenterologist to clarify.  If you requested that your care partner not be given the details of your procedure findings, then the procedure report has been included in a sealed envelope for you to review at your convenience later.  YOU SHOULD EXPECT: Some feelings of bloating in the abdomen. Passage of more gas than usual.  Walking can help get rid of the air that was put into your GI tract during the procedure and reduce the bloating. If you had a lower endoscopy (such as a colonoscopy or flexible sigmoidoscopy) you may notice spotting of blood in your stool or on the toilet paper. If you underwent a bowel prep for your procedure, you may not have a normal bowel movement for a few days.  Please Note:  You might notice some irritation and congestion in your nose or some drainage.  This is from the oxygen used during your procedure.  There is no need for concern and it should clear up in a day or so.  SYMPTOMS TO REPORT IMMEDIATELY:  Following lower endoscopy (colonoscopy or flexible sigmoidoscopy):  Excessive amounts of blood in the stool  Significant tenderness or worsening of abdominal pains  Swelling of the abdomen that is new, acute  Fever of 100F or higher  For urgent or emergent issues, a gastroenterologist can be reached at any hour by calling [phone]. Do not use MyChart messaging for urgent concerns.    DIET:  We do recommend a small meal at first, but then you may proceed to your regular diet.  Drink plenty of fluids but you should avoid alcoholic beverages for 24  hours.  ACTIVITY:  You should plan to take it easy for the rest of today and you should NOT DRIVE or use heavy machinery until tomorrow (because of the sedation medicines used during the test).    FOLLOW UP: Our staff will call the number listed on your records 48-72 hours following your procedure to check on you and address any questions or concerns that you may have regarding the information given to you following your procedure. If we do not reach you, we will leave a message.  We will attempt to reach you two times.  During this call, we will ask if you have developed any symptoms of COVID 19. If you develop any symptoms (ie: fever, flu-like symptoms, shortness of breath, cough etc.) before then, please call [phone].  If you test positive for Covid 19 in the 2 weeks post procedure, please call and report this information to [place].    If any biopsies were taken you will be contacted by phone or by letter within the next 1-3 weeks.  Please call us at [phone] if you have not heard about the biopsies in 3 weeks.    SIGNATURES/CONFIDENTIALITY: You and/or your care partner have signed paperwork which will be entered into your electronic medical record.  These signatures attest to the fact that that the information above on your After Visit Summary has been reviewed and is understood.  Full responsibility of the confidentiality of this discharge information lies  with you and/or your care-partner.  

## 2022-03-06 NOTE — Progress Notes (Signed)
Report to PACU, RN, vss, BBS= Clear.  

## 2022-03-06 NOTE — Progress Notes (Signed)
VS completed by DT.  Pt's states no medical or surgical changes since previsit or office visit.  

## 2022-03-06 NOTE — Progress Notes (Signed)
Called to room to assist during endoscopic procedure.  Patient ID and intended procedure confirmed with present staff. Received instructions for my participation in the procedure from the performing physician.  

## 2022-03-07 ENCOUNTER — Telehealth: Payer: Self-pay

## 2022-03-07 LAB — CEA: CEA: 3.5 ng/mL — ABNORMAL HIGH

## 2022-03-07 NOTE — Telephone Encounter (Signed)
  Follow up Call-     03/06/2022   10:07 AM  Call back number  Post procedure Call Back phone  # [phone]  Permission to leave phone message Yes     Patient questions:  Do you have a fever, pain , or abdominal swelling? No. Pain Score  0 *  Have you tolerated food without any problems? Yes.    Have you been able to return to your normal activities? Yes.    Do you have any questions about your discharge instructions: Diet   No. Medications  No. Follow up visit  No.  Do you have questions or concerns about your Care? No.  Actions: * If pain score is 4 or above: No action needed, pain <4.

## 2022-03-12 ENCOUNTER — Telehealth: Payer: Self-pay | Admitting: Nurse Practitioner

## 2022-03-12 ENCOUNTER — Other Ambulatory Visit: Payer: Self-pay

## 2022-03-12 DIAGNOSIS — K6289 Other specified diseases of anus and rectum: Secondary | ICD-10-CM

## 2022-03-12 DIAGNOSIS — C801 Malignant (primary) neoplasm, unspecified: Secondary | ICD-10-CM

## 2022-03-12 NOTE — Telephone Encounter (Signed)
Received returned call from Dr. Marcello Moores' staff with [facility] requesting to add CT of chest to CT A/P scheduled for tomorrow. Orders placed as requested. Urgent message sent to radiology scheduling for scheduling purposes. Also sent urgent message to pre-cert team to ensure we have auth. Pt is aware of scheduling process.

## 2022-03-12 NOTE — Telephone Encounter (Signed)
I called Destiny [name] at this time to discuss her colonoscopy biopsy results, however, the patient did not answer therefore I left a message on her voicemail for her to contact me to discuss these results as requested by Dr. Tarri Glenn as she is out of the office this week. Await patient's return call.

## 2022-03-12 NOTE — Telephone Encounter (Signed)
Pt returned call. Informed about appt date/time/location for [facility]. Verbalized acceptance and understanding. Pt is aware of scheduling process with oncology. Awaiting response from Dr. Marcello Moores re: need for CT chest.

## 2022-03-12 NOTE — Telephone Encounter (Signed)
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  She will proceed with the CTAP for staging as scheduled 03/13/2022.  She is aware Dr. Tarri Glenn is on vacation this week.  Ammie, please enter an oncology referral for further evaluation/treatment for regarding colon adenocarcinoma. THX.

## 2022-03-12 NOTE — Telephone Encounter (Signed)
Urgent amb referrals placed for [facility] and hem/onc. Called [facility] to schedule pt for appt. Pt has been scheduled 03/24/22 @ 940am, arrival time 925am. While on the phone with nursing triage, inquired if Dr. Marcello Moores wanted CT of chest added to scheduled CTAP for tomorrow. States they will send her and her nurse a message to inquire further and will call or send Epic message with her response. Called pt to make her aware of appt. LVM requesting returned call. Reminder created to ensure pt is scheduled with oncology in a timely manner.

## 2022-03-13 ENCOUNTER — Ambulatory Visit (HOSPITAL_COMMUNITY)
Admission: RE | Admit: 2022-03-13 | Discharge: 2022-03-13 | Disposition: A | Discharge status: Home / Self Care | Payer: PPO | Source: Ambulatory Visit | Attending: Gastroenterology | Admitting: Gastroenterology

## 2022-03-13 ENCOUNTER — Encounter: Payer: Self-pay | Admitting: *Deleted

## 2022-03-13 DIAGNOSIS — K6289 Other specified diseases of anus and rectum: Secondary | ICD-10-CM | POA: Diagnosis not present

## 2022-03-13 DIAGNOSIS — J40 Bronchitis, not specified as acute or chronic: Secondary | ICD-10-CM | POA: Diagnosis not present

## 2022-03-13 DIAGNOSIS — C801 Malignant (primary) neoplasm, unspecified: Secondary | ICD-10-CM | POA: Insufficient documentation

## 2022-03-13 DIAGNOSIS — J9811 Atelectasis: Secondary | ICD-10-CM | POA: Diagnosis not present

## 2022-03-13 DIAGNOSIS — I700 Atherosclerosis of aorta: Secondary | ICD-10-CM | POA: Diagnosis not present

## 2022-03-13 DIAGNOSIS — R918 Other nonspecific abnormal finding of lung field: Secondary | ICD-10-CM | POA: Diagnosis not present

## 2022-03-13 DIAGNOSIS — N200 Calculus of kidney: Secondary | ICD-10-CM | POA: Diagnosis not present

## 2022-03-13 DIAGNOSIS — C20 Malignant neoplasm of rectum: Secondary | ICD-10-CM | POA: Diagnosis not present

## 2022-03-13 IMAGING — CT CT CHEST W/ CM
2 of 5 series · 13 of 36 positions shown, 16 images · IV contrast (APPLIED)
Comparison: None Available.

CLINICAL DATA: Rectal adenocarcinoma

EXAM:
CT CHEST, ABDOMEN, AND PELVIS WITH CONTRAST
TECHNIQUE: Multidetector CT imaging of the chest, abdomen and pelvis was
performed following the standard protocol during bolus
administration of intravenous contrast.

[Series 504: thins · axial · 0.91mm/px · z∈[-630,-118]mm · 10 of 854 slices shown, 13 images]
[im 82/854  mediastinal]
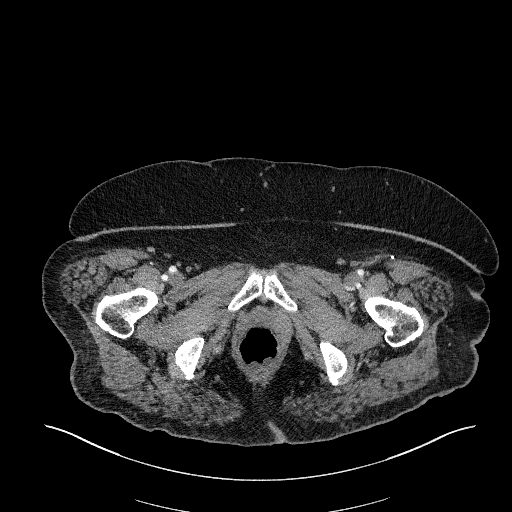
[im 82/854  lung]
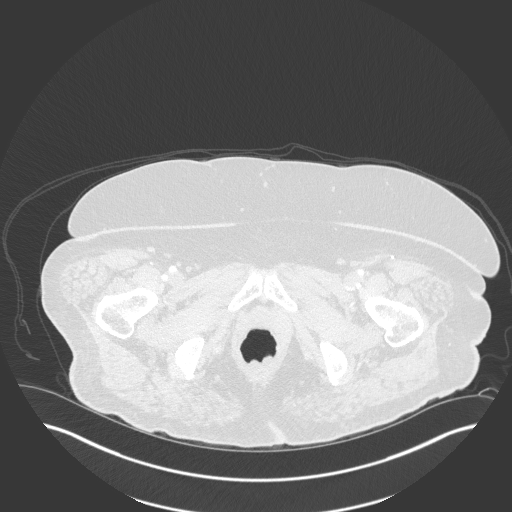
[im 163/854  lung]
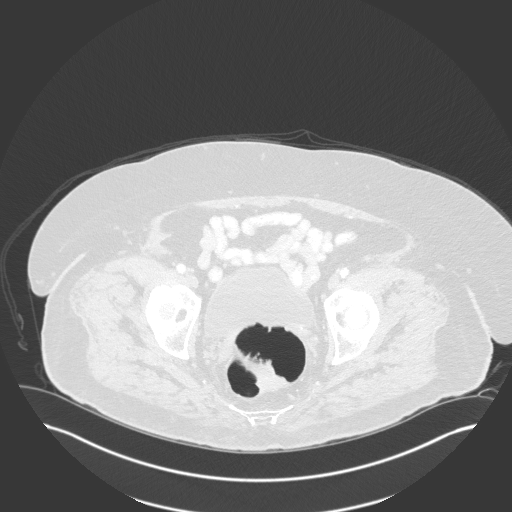
[im 244/854  lung]
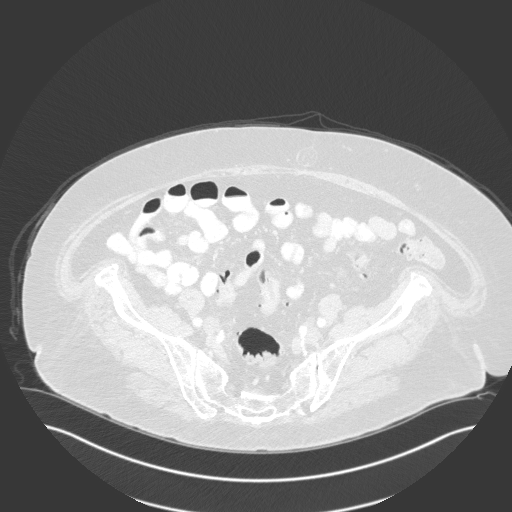
[im 325/854  lung]
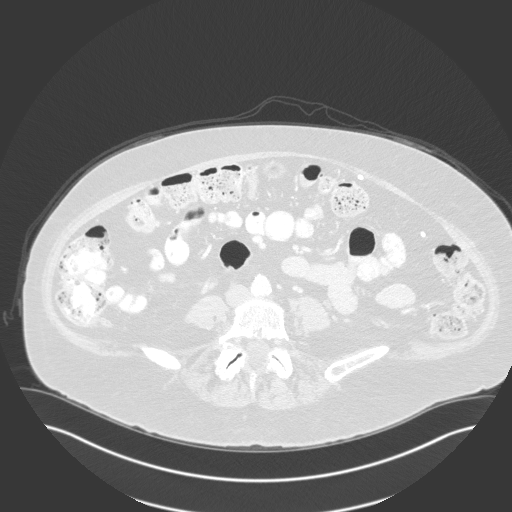
[im 407/854  mediastinal]
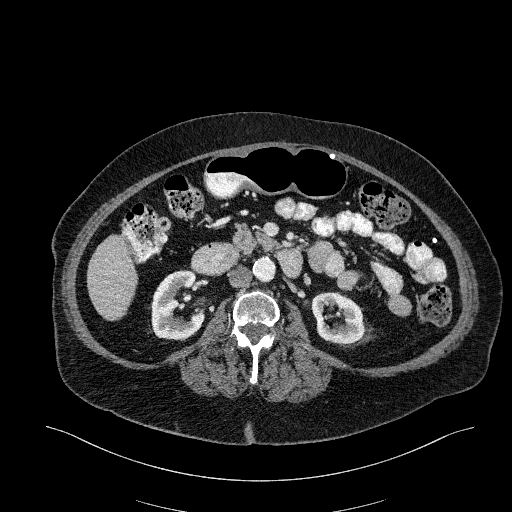
[im 407/854  lung]
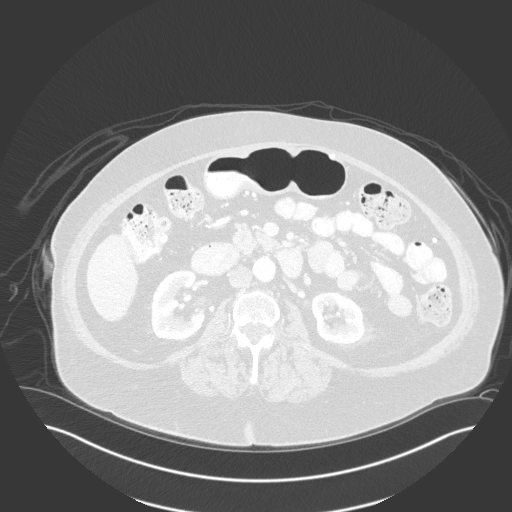
[im 488/854  lung]
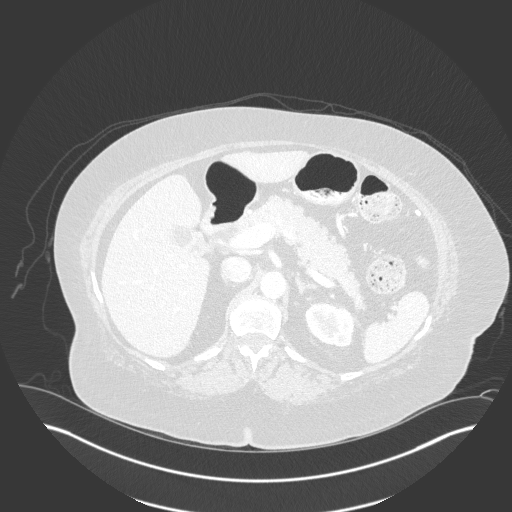
[im 569/854  lung]
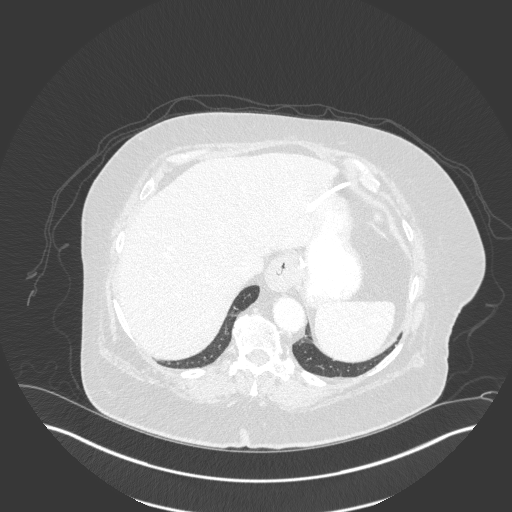
[im 650/854  lung]
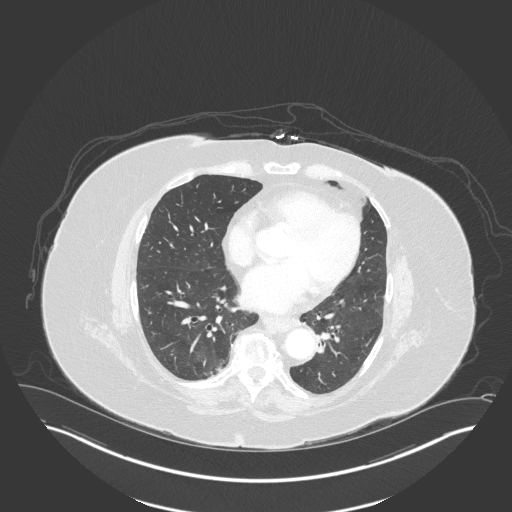
[im 732/854  mediastinal]
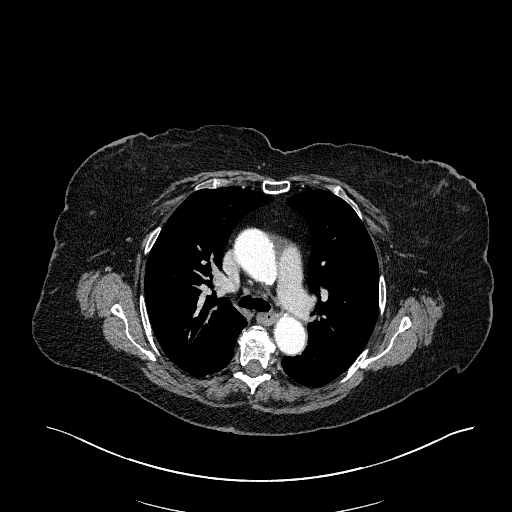
[im 732/854  lung]
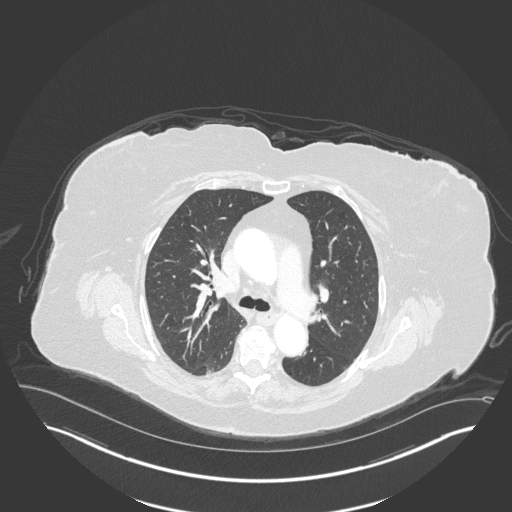
[im 813/854  lung]
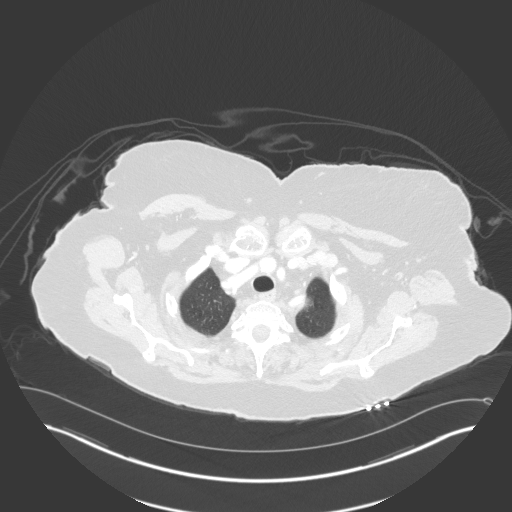

[Series 506: coronals · coronal · 0.86mm/px · 3 of 158 slices shown]
[im 32/158  lung]
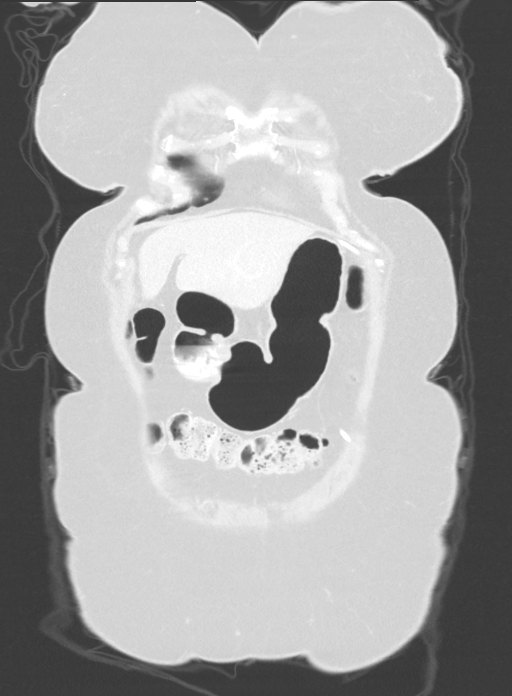
[im 63/158  lung]
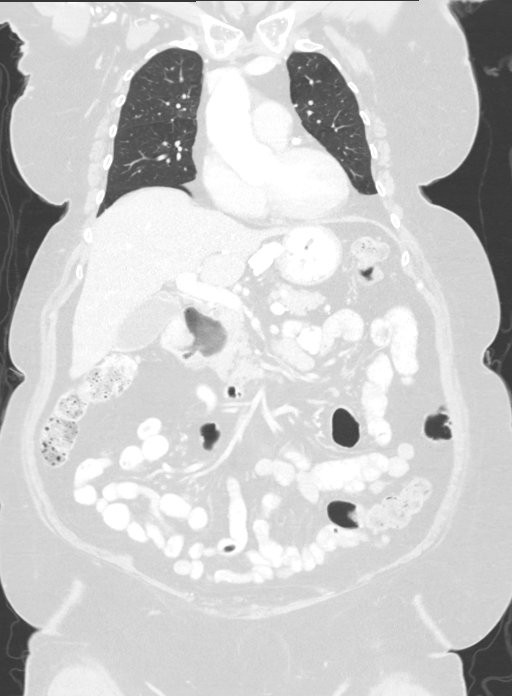
[im 95/158  lung]
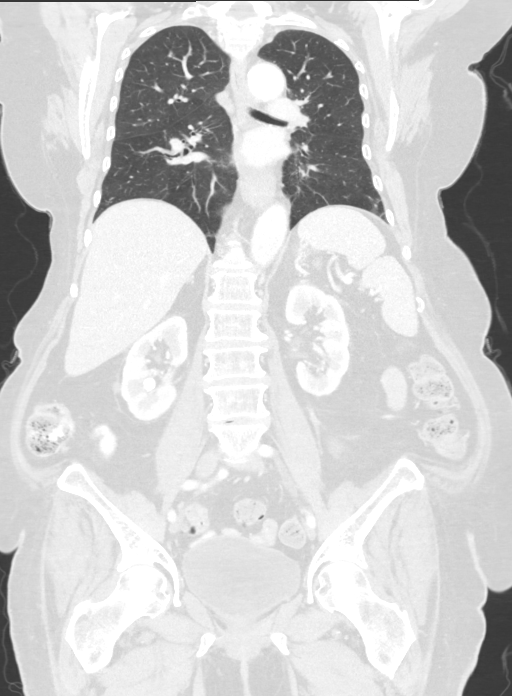

[13 of 36 positions shown; findings below may reference images not displayed]

RADIATION DOSE REDUCTION: This exam was performed according to the
departmental dose-optimization program which includes automated
exposure control, adjustment of the mA and/or kV according to
patient size and/or use of iterative reconstruction technique.

CONTRAST:  100mL OMNIPAQUE IOHEXOL 300 MG/ML  SOLN
FINDINGS: CT CHEST FINDINGS

Cardiovascular: 2 vessel aortic arch. The right brachiocephalic and
left common carotid artery share a common origin. Atherosclerotic
calcifications present throughout the thoracic aorta and along the
coronary arteries. The heart is normal in size. No pericardial
effusion.

Mediastinum/Nodes: Unremarkable visualized thyroid gland. No
mediastinal mass or adenopathy. Surgical changes of prior lap band.

Lungs/Pleura: Punctate calcified subpleural nodule in the periphery
of the right upper lobe (image 31 series 505). Tiny calcified
pulmonary nodule in the periphery of the right upper lobe (image 38
series 505). Small 4 mm nodular opacity in the periphery of the
superior segment of the left lower lobe (image 44 series 505).
Additional areas of linear atelectasis versus scarring present in
the lingula and left lower lobe. Mild bronchitic changes.

Musculoskeletal: No acute fracture or aggressive appearing lytic or
blastic osseous lesion. Probable T8 hemangioma.

CT ABDOMEN PELVIS FINDINGS

Hepatobiliary: No focal liver abnormality is seen. No gallstones,
gallbladder wall thickening, or biliary dilatation.

Pancreas: Unremarkable. No pancreatic ductal dilatation or
surrounding inflammatory changes.

Spleen: Normal in size without focal abnormality.

Adrenals/Urinary Tract: Normal adrenal glands. 1.3 cm nonobstructing
stone in the interpolar right renal collecting system. Circumscribed
low-attenuation renal lesion exophytic from the interpolar left
kidney consistent with a simple cyst. No further follow-up
recommended. Ureters and bladder are unremarkable.

Stomach/Bowel: Large periampullary duodenal diverticulum. Colonic
diverticular disease without CT evidence of active inflammation.
Normal appendix in the right lower quadrant. No definite colonic
wall thickening identified. Moderate colonic stool burden.

Vascular/Lymphatic: Atherosclerotic calcifications throughout the
abdominal aorta. No evidence of aneurysm. No suspicious
lymphadenopathy.

Reproductive: Status post hysterectomy. No adnexal masses.

Other: No abdominal wall hernia or abnormality. No abdominopelvic
ascites.

Musculoskeletal: No acute fracture or aggressive appearing lytic or
blastic osseous lesion.
IMPRESSION: 1. No visible colonic wall thickening or mass. No evidence of
metastatic disease in the chest, abdomen or pelvis.
2. Small 2-4 mm pulmonary nodules at least 2 of which are calcified
and almost certainly benign granulomas. No follow-up needed if
patient is low-risk (and has no known or suspected primary
neoplasm). Non-contrast chest CT can be considered in 12 months if
patient is high-risk. This recommendation follows the consensus
statement: Guidelines for Management of Incidental Pulmonary Nodules
Detected on CT Images: From the [HOSPITAL] [JY]; Radiology
[JY]; [DATE].
3. Nonobstructing stone in the interpolar right renal collecting
system.
4. Colonic diverticular disease without CT evidence of active
inflammation.
5.  Aortic Atherosclerosis ([JY]-[JY]).
6. Surgical changes of prior lap band procedure.
7. Additional ancillary findings as above.

## 2022-03-13 IMAGING — CT CT ABD-PELV W/ CM
2 of 5 series · 12 of 36 positions shown, 15 images · IV contrast (APPLIED)
Comparison: None Available.

CLINICAL DATA: Rectal adenocarcinoma

EXAM:
CT CHEST, ABDOMEN, AND PELVIS WITH CONTRAST
TECHNIQUE: Multidetector CT imaging of the chest, abdomen and pelvis was
performed following the standard protocol during bolus
administration of intravenous contrast.

[Series 2: cap with · axial · 0.91mm/px · z∈[-630,-150]mm · 9 of 120 slices shown, 12 images]
[im 12/120  mediastinal]
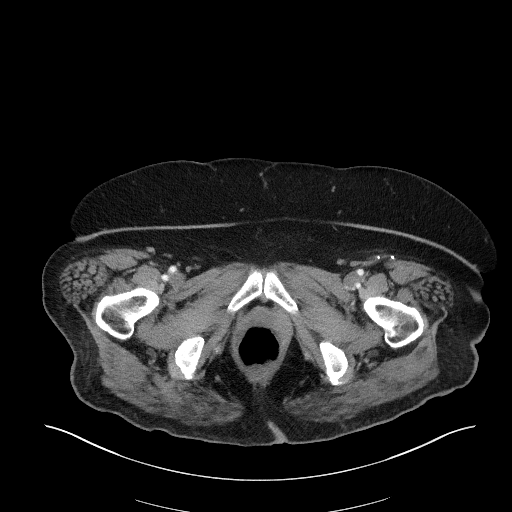
[im 12/120  lung]
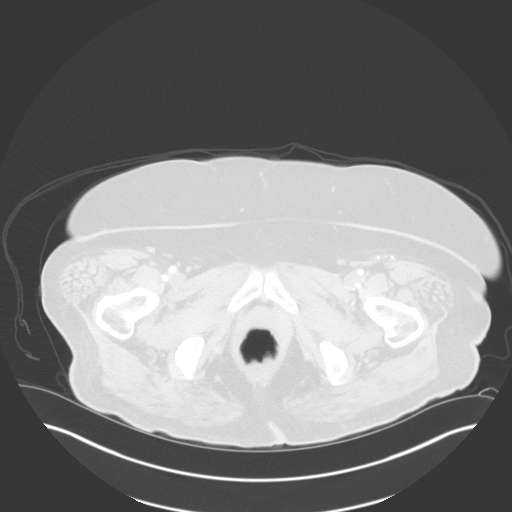
[im 24/120  lung]
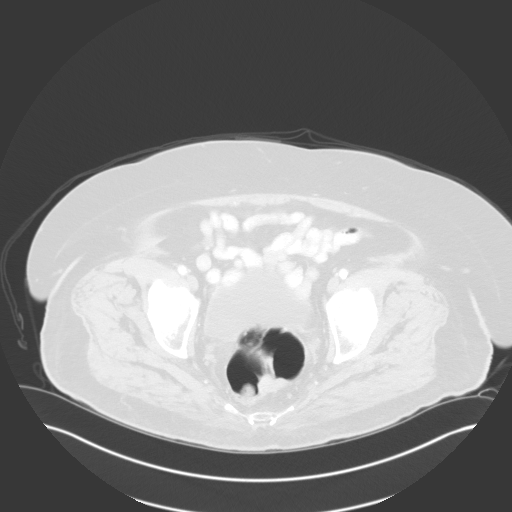
[im 36/120  lung]
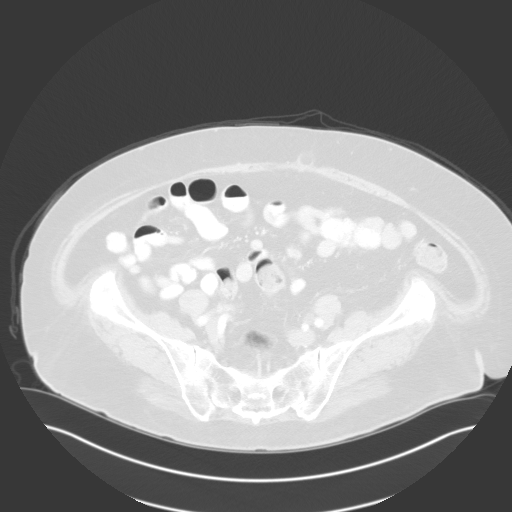
[im 48/120  lung]
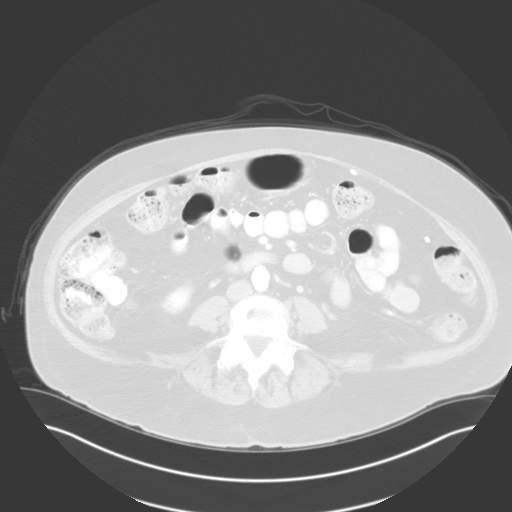
[im 60/120  mediastinal]
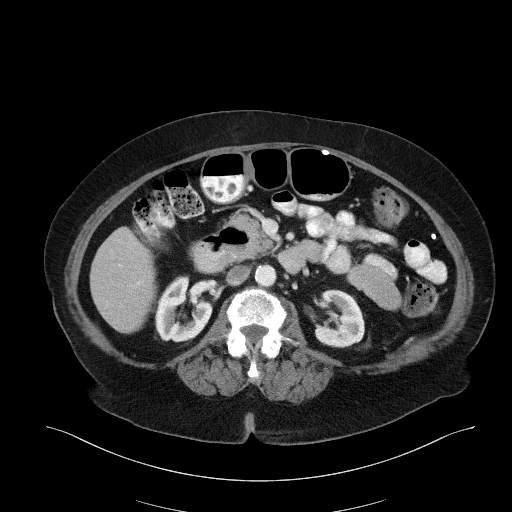
[im 60/120  lung]
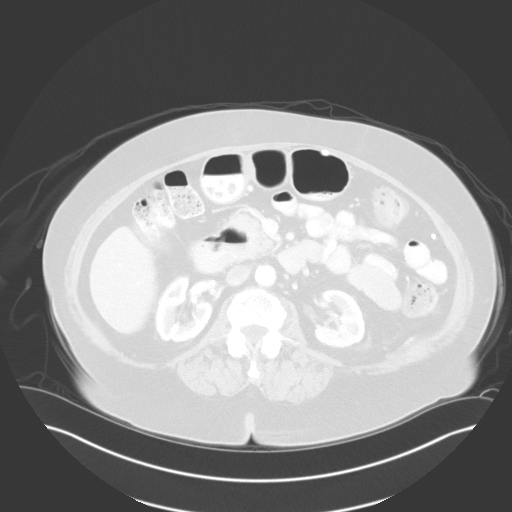
[im 72/120  lung]
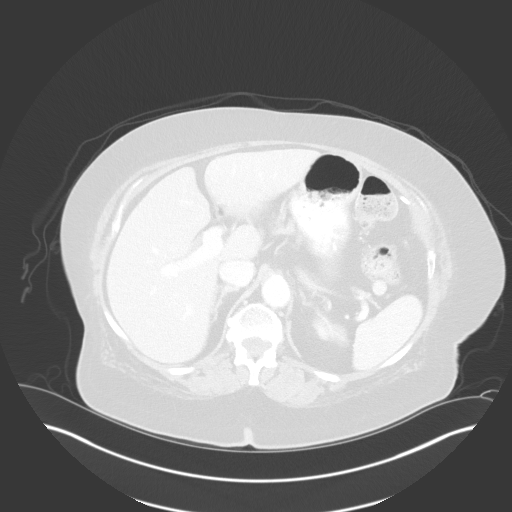
[im 84/120  lung]
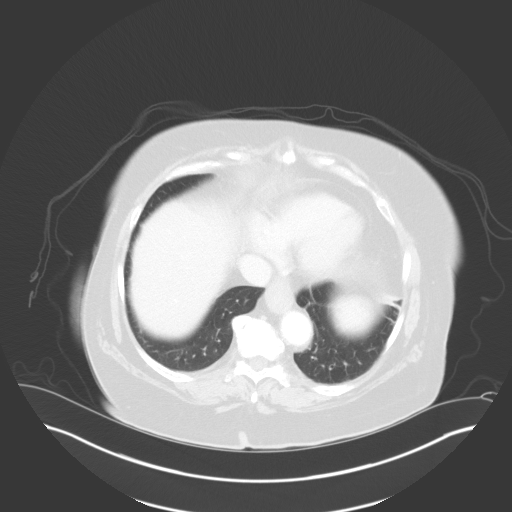
[im 96/120  lung]
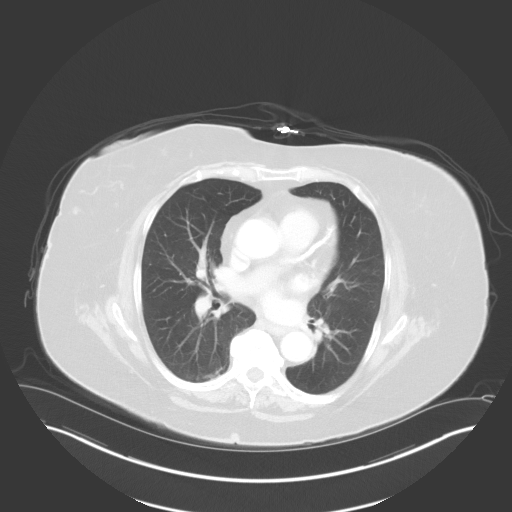
[im 108/120  mediastinal]
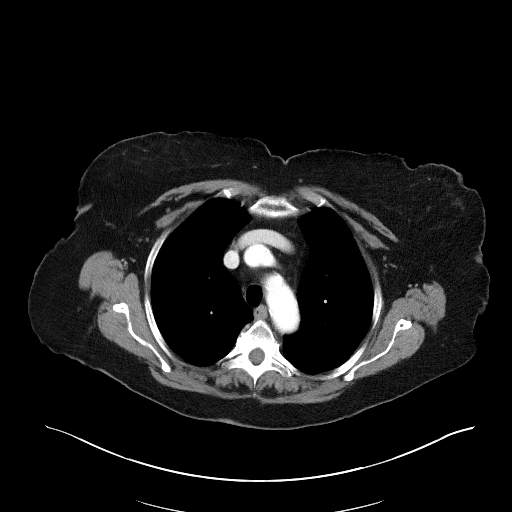
[im 108/120  lung]
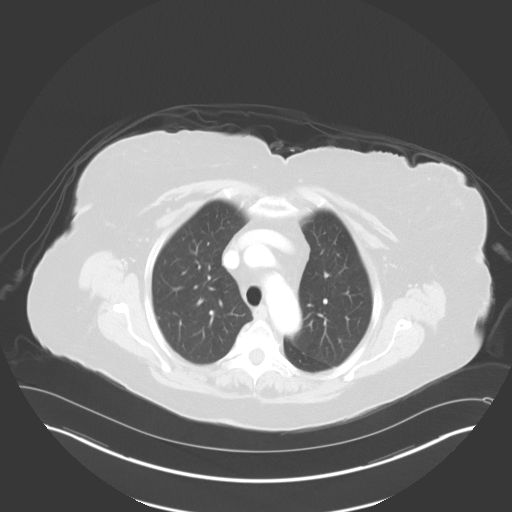

[Series 5: coronals · coronal · 0.86mm/px · 3 of 158 slices shown]
[im 32/158  lung]
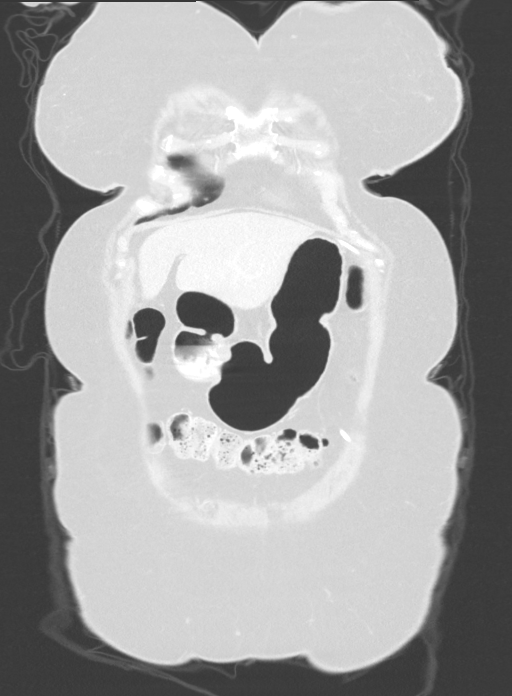
[im 63/158  lung]
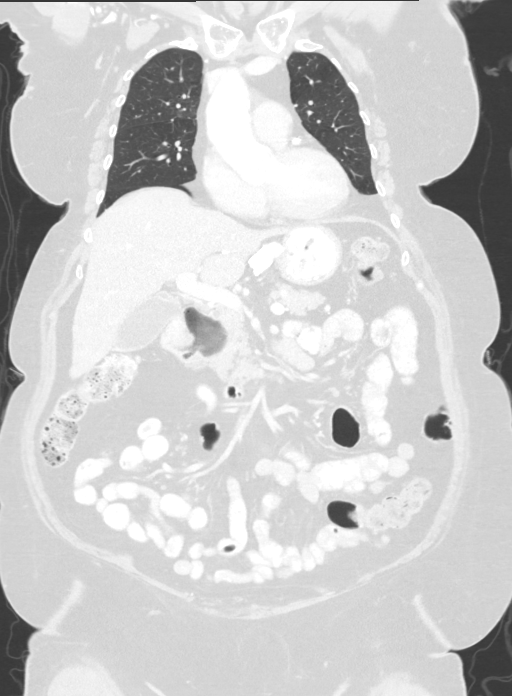
[im 95/158  lung]
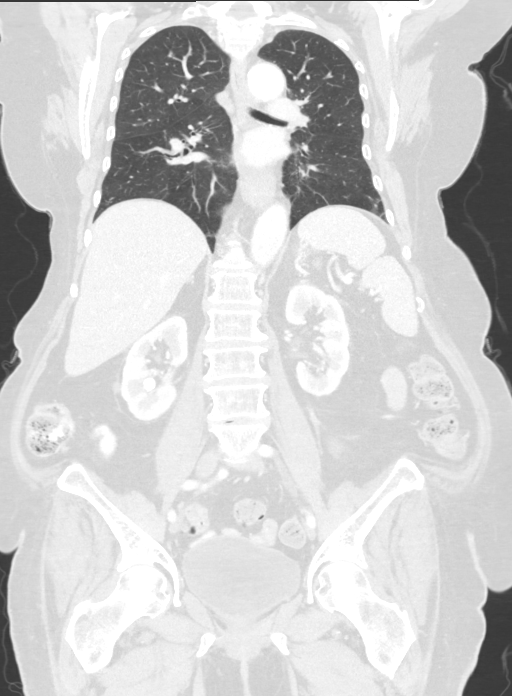

[12 of 36 positions shown; findings below may reference images not displayed]

RADIATION DOSE REDUCTION: This exam was performed according to the
departmental dose-optimization program which includes automated
exposure control, adjustment of the mA and/or kV according to
patient size and/or use of iterative reconstruction technique.

CONTRAST:  100mL OMNIPAQUE IOHEXOL 300 MG/ML  SOLN
FINDINGS: CT CHEST FINDINGS

Cardiovascular: 2 vessel aortic arch. The right brachiocephalic and
left common carotid artery share a common origin. Atherosclerotic
calcifications present throughout the thoracic aorta and along the
coronary arteries. The heart is normal in size. No pericardial
effusion.

Mediastinum/Nodes: Unremarkable visualized thyroid gland. No
mediastinal mass or adenopathy. Surgical changes of prior lap band.

Lungs/Pleura: Punctate calcified subpleural nodule in the periphery
of the right upper lobe (image 31 series 505). Tiny calcified
pulmonary nodule in the periphery of the right upper lobe (image 38
series 505). Small 4 mm nodular opacity in the periphery of the
superior segment of the left lower lobe (image 44 series 505).
Additional areas of linear atelectasis versus scarring present in
the lingula and left lower lobe. Mild bronchitic changes.

Musculoskeletal: No acute fracture or aggressive appearing lytic or
blastic osseous lesion. Probable T8 hemangioma.

CT ABDOMEN PELVIS FINDINGS

Hepatobiliary: No focal liver abnormality is seen. No gallstones,
gallbladder wall thickening, or biliary dilatation.

Pancreas: Unremarkable. No pancreatic ductal dilatation or
surrounding inflammatory changes.

Spleen: Normal in size without focal abnormality.

Adrenals/Urinary Tract: Normal adrenal glands. 1.3 cm nonobstructing
stone in the interpolar right renal collecting system. Circumscribed
low-attenuation renal lesion exophytic from the interpolar left
kidney consistent with a simple cyst. No further follow-up
recommended. Ureters and bladder are unremarkable.

Stomach/Bowel: Large periampullary duodenal diverticulum. Colonic
diverticular disease without CT evidence of active inflammation.
Normal appendix in the right lower quadrant. No definite colonic
wall thickening identified. Moderate colonic stool burden.

Vascular/Lymphatic: Atherosclerotic calcifications throughout the
abdominal aorta. No evidence of aneurysm. No suspicious
lymphadenopathy.

Reproductive: Status post hysterectomy. No adnexal masses.

Other: No abdominal wall hernia or abnormality. No abdominopelvic
ascites.

Musculoskeletal: No acute fracture or aggressive appearing lytic or
blastic osseous lesion.
IMPRESSION: 1. No visible colonic wall thickening or mass. No evidence of
metastatic disease in the chest, abdomen or pelvis.
2. Small 2-4 mm pulmonary nodules at least 2 of which are calcified
and almost certainly benign granulomas. No follow-up needed if
patient is low-risk (and has no known or suspected primary
neoplasm). Non-contrast chest CT can be considered in 12 months if
patient is high-risk. This recommendation follows the consensus
statement: Guidelines for Management of Incidental Pulmonary Nodules
Detected on CT Images: From the [HOSPITAL] [JY]; Radiology
[JY]; [DATE].
3. Nonobstructing stone in the interpolar right renal collecting
system.
4. Colonic diverticular disease without CT evidence of active
inflammation.
5.  Aortic Atherosclerosis ([JY]-[JY]).
6. Surgical changes of prior lap band procedure.
7. Additional ancillary findings as above.

## 2022-03-13 MED ORDER — IOHEXOL 300 MG/ML  SOLN
100.0000 mL | Freq: Once | INTRAMUSCULAR | Status: AC | PRN
  Administered 2022-03-13: 100 mL via INTRAVENOUS

## 2022-03-13 MED ORDER — SODIUM CHLORIDE (PF) 0.9 % IJ SOLN
INTRAMUSCULAR | Status: AC
  Filled 2022-03-13: qty 50

## 2022-03-13 NOTE — Telephone Encounter (Signed)
CT chest and CT A/P have been completed and have been resulted. Awaiting provider review and recommendations. Pt is scheduled to be seen by Dr. Marcello Moores with [facility] on 03/24/22. Below is update re: onc appt.  Onc Referral Update:  ----- Message ----- From: Legrand Pitts Sent: 03/13/2022   3:31 PM EDT To: Lin Givens, RN, Heloise Beecham Subject: RE: RN review                                   Will do, thank you for the update! ----- Message ----- From: Lin Givens, RN Sent: 03/13/2022   3:19 PM EDT To: Art Buff Subject: RE: RN review                                   I have spoken with patient and she is seeing surgeon on 6/12 based on that appt we will schedule her appt.     I will be contacting patient after that appt and then will send scheduling message Thanks Abigail Butts ----- Message ----- From: Legrand Pitts Sent: 03/12/2022   3:08 PM EDT To: Lin Givens, RN, Heloise Beecham Subject: RN review                                       Please review for scheduling   Thank you.

## 2022-03-13 NOTE — Progress Notes (Signed)
PATIENT NAVIGATOR PROGRESS NOTE  Name: Destiny [name] Date: 03/13/2022 MRN: [id number]  DOB: March 24, 1946   Reason for visit:  Introductory phone call  Comments:  Spoke with patient and discussed sequencing of appts. She is meeting with Dr Marcello Moores, surgeon on June 12th for initial consult. CT of Chest and Abd/pelvis were done today. Dr Benay Spice will see her based on Dr Marcello Moores consult, either for neoadjuvant treatment or post surgery.  Will F/U with pt after visit with Dr Marcello Moores on 6/12 Provided contact information to call with any issues or questions     Time spent counseling/coordinating care: 30-45 minutes

## 2022-03-18 ENCOUNTER — Encounter: Payer: Self-pay | Admitting: Gastroenterology

## 2022-03-18 NOTE — Telephone Encounter (Signed)
See result note.  

## 2022-03-24 ENCOUNTER — Telehealth: Payer: Self-pay | Admitting: *Deleted

## 2022-03-24 DIAGNOSIS — C20 Malignant neoplasm of rectum: Secondary | ICD-10-CM | POA: Diagnosis not present

## 2022-03-24 NOTE — Telephone Encounter (Signed)
Noted 1 year follow up and saw MyChart message from patient asking for a call from you to explain further. Thanks

## 2022-03-25 ENCOUNTER — Other Ambulatory Visit: Payer: Self-pay | Admitting: General Surgery

## 2022-03-25 DIAGNOSIS — C20 Malignant neoplasm of rectum: Secondary | ICD-10-CM

## 2022-03-25 NOTE — Telephone Encounter (Signed)
I called the patient this morning as I will had not been aware that she had requested a call back.  As of this morning, she actually had no questions.  She has been very appreciative for the care and the information that she has been receiving all along.

## 2022-03-28 ENCOUNTER — Encounter: Payer: Self-pay | Admitting: *Deleted

## 2022-03-28 NOTE — Progress Notes (Signed)
Spoke with Destiny [name], she is scheduled to have MRI w and w/o contrast and then proceed with surgery with Dr Marcello Moores.   Will continue to follow and will plan appt with Dr Benay Spice 2 weeks after surgery  Reinforced contact information to call if she has any questions or concerns

## 2022-03-30 ENCOUNTER — Ambulatory Visit
Admission: RE | Admit: 2022-03-30 | Discharge: 2022-03-30 | Disposition: A | Discharge status: Home / Self Care | Payer: HMO | Source: Ambulatory Visit | Attending: General Surgery | Admitting: General Surgery

## 2022-03-30 DIAGNOSIS — C20 Malignant neoplasm of rectum: Secondary | ICD-10-CM | POA: Diagnosis not present

## 2022-03-30 DIAGNOSIS — N3289 Other specified disorders of bladder: Secondary | ICD-10-CM | POA: Diagnosis not present

## 2022-03-30 DIAGNOSIS — D490 Neoplasm of unspecified behavior of digestive system: Secondary | ICD-10-CM | POA: Diagnosis not present

## 2022-03-30 DIAGNOSIS — Z90710 Acquired absence of both cervix and uterus: Secondary | ICD-10-CM | POA: Diagnosis not present

## 2022-03-30 IMAGING — MR MR PELVIS WO/W CM
8 of 9 series · 40 of 48 positions shown · IV contrast (18 ML MULTIHANCE)
Comparison: CT of the abdomen and pelvis from [DATE].

CLINICAL DATA: Rectal neoplasm. A 75-year-old presenting with known
rectal neoplasm found at endoscopy reportedly 12 cm from the anal
verge.

EXAM:
MRI PELVIS WITHOUT AND WITH CONTRAST
TECHNIQUE: Multiplanar multisequence MR imaging of the pelvis was performed
both before and after administration of intravenous contrast.
Ultrasound gel was administered per rectum to optimize tumor
evaluation.
CONTRAST:  18mL MULTIHANCE GADOBENATE DIMEGLUMINE 529 MG/ML IV SOLN

[Series 4: t2_tse_sag · sagittal · 5.0mm · 0.94mm/px · 5 of 25 slices shown]
[im 1/25]
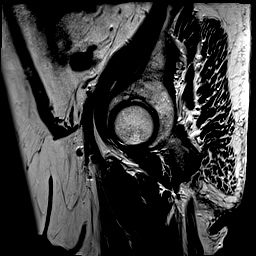
[im 7/25]
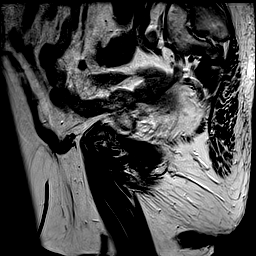
[im 13/25]
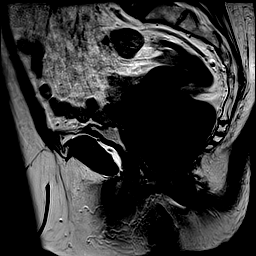
[im 19/25]
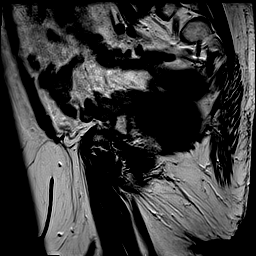
[im 25/25]
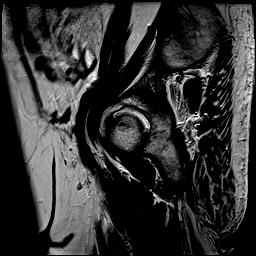

[Series 5: t2_tse_fs sag · sagittal · 5.0mm · 0.94mm/px · 5 of 25 slices shown]
[im 1/25]
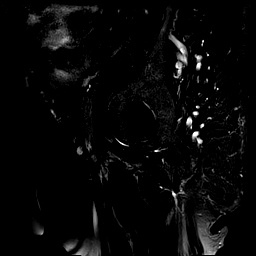
[im 7/25]
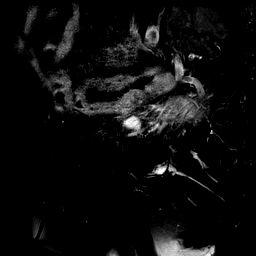
[im 13/25]
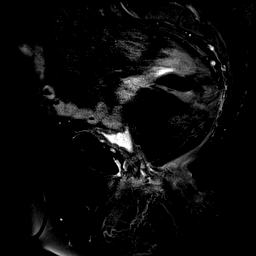
[im 19/25]
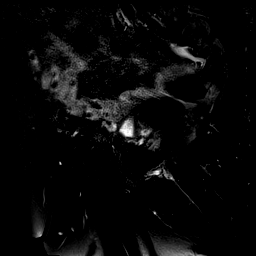
[im 25/25]
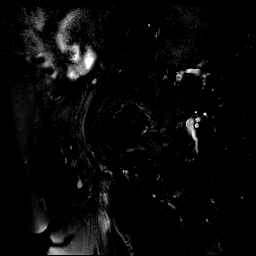

[Series 6: t1_tse axial obl · axial · 4.0mm · 0.57mm/px · z∈[-31,+126]mm · 6 of 36 slices shown (1 of 2)]
[im 1/36]
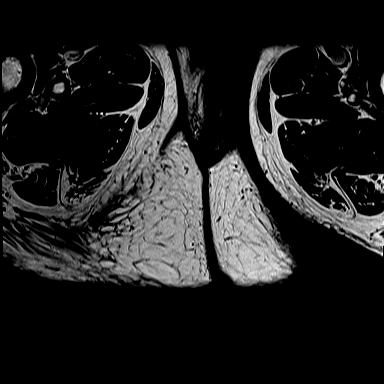
[im 8/36]
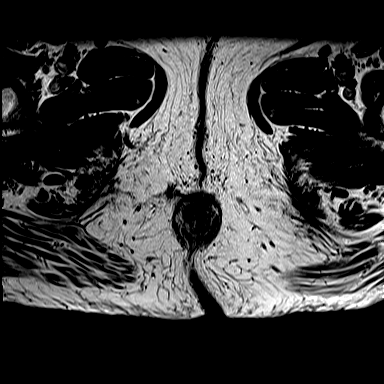
[im 15/36]
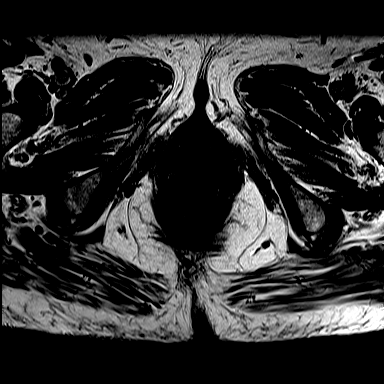
[im 22/36]
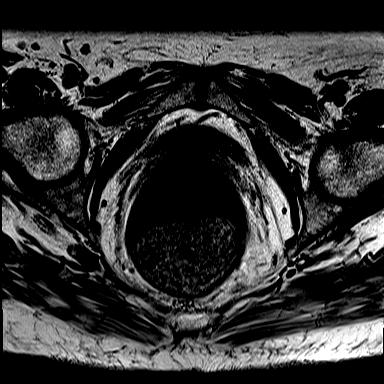
[im 29/36]
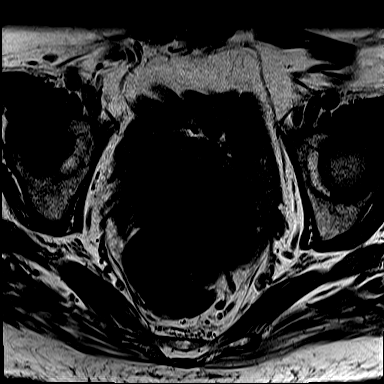
[im 36/36]
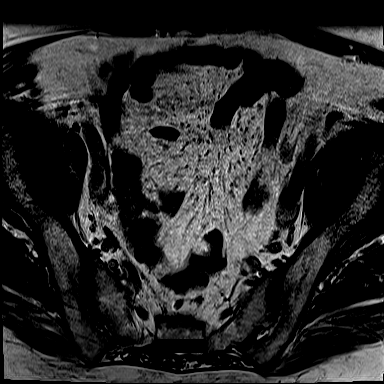

[Series 7: t2_tse axial obl · axial · 4.0mm · 0.69mm/px · z∈[-31,+126]mm · 6 of 36 slices shown (1 of 2)]
[im 1/36]
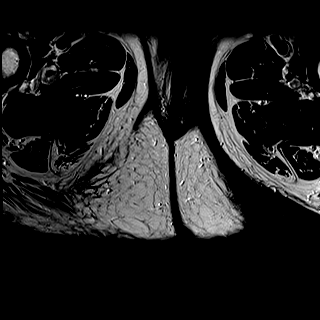
[im 8/36]
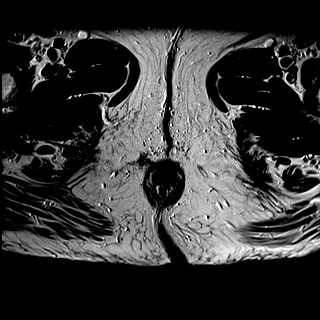
[im 15/36]
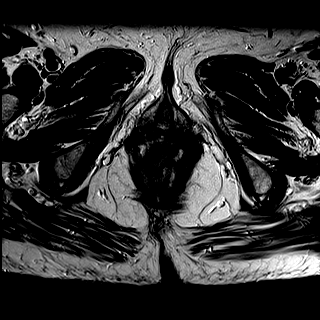
[im 22/36]
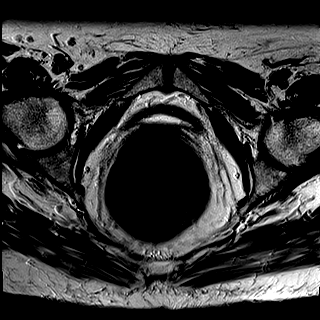
[im 29/36]
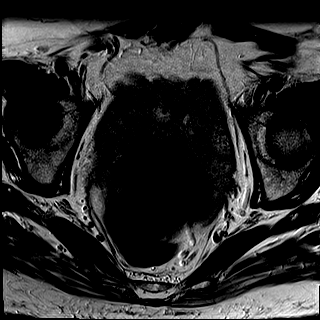
[im 36/36]
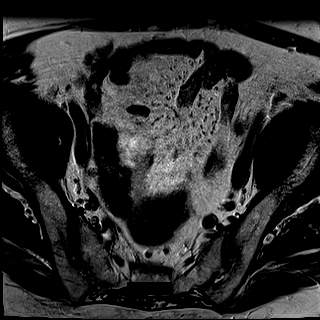

[Series 8: t2_tse axial obl · axial · 4.0mm · 0.70mm/px · z∈[-32,+126]mm · 6 of 36 slices shown (2 of 2)]
[im 1/36]
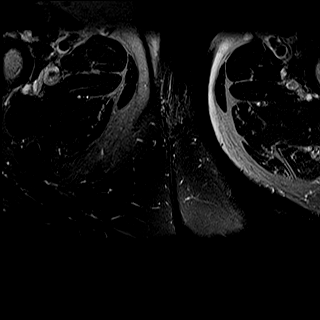
[im 8/36]
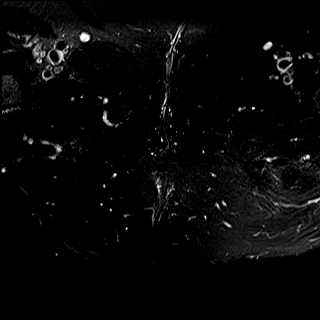
[im 15/36]
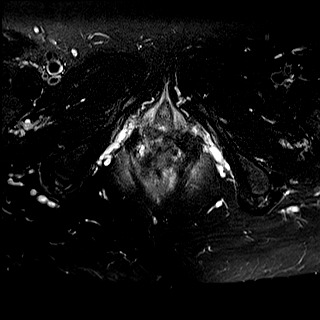
[im 22/36]
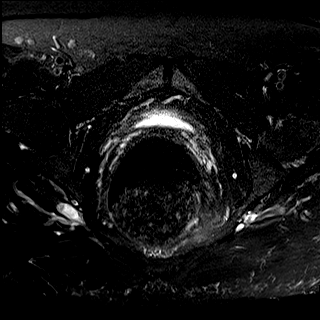
[im 29/36]
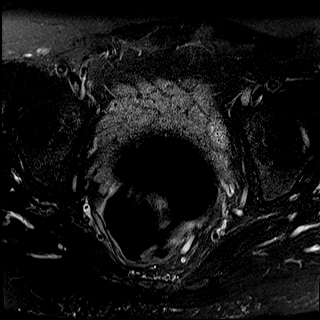
[im 36/36]
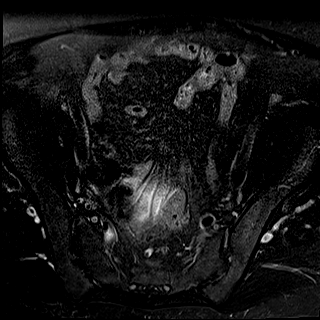

[Series 9: T2 fat-sat · coronal · 4.0mm · 0.75mm/px · 5 of 29 slices shown]
[im 1/29]
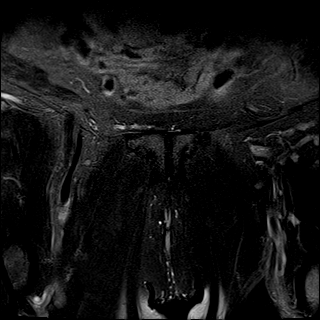
[im 8/29]
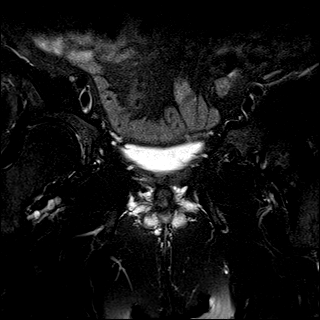
[im 15/29]
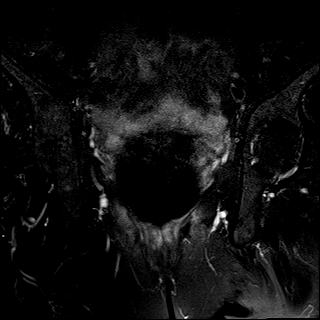
[im 22/29]
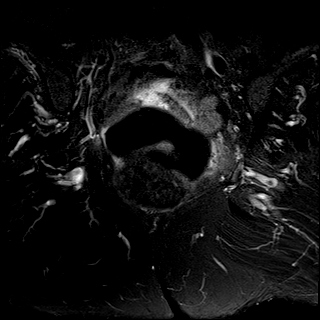
[im 29/29]
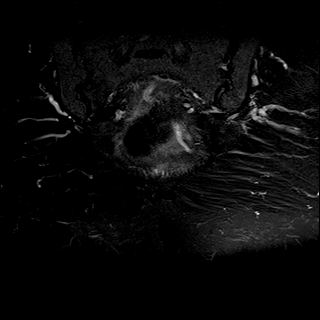

[Series 10: t1_tse axial obl · axial · 4.0mm · 0.62mm/px · z∈[-35,+123]mm · 6 of 36 slices shown (2 of 2)]
[im 1/36]
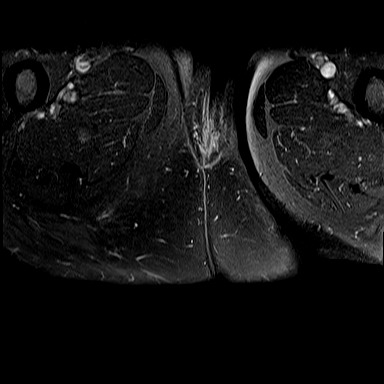
[im 8/36]
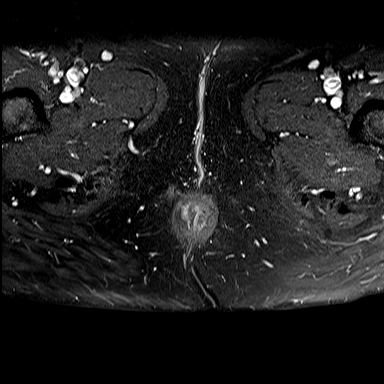
[im 15/36]
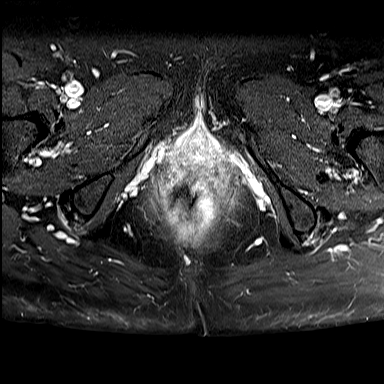
[im 22/36]
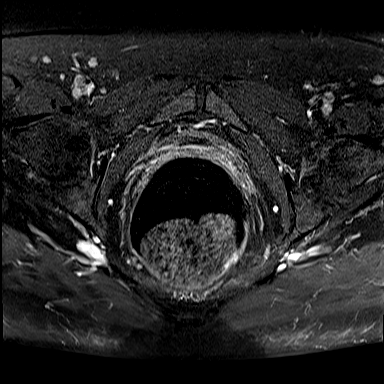
[im 29/36]
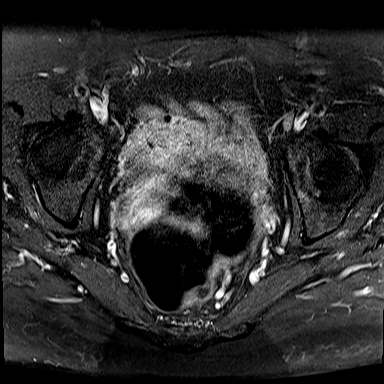
[im 36/36]
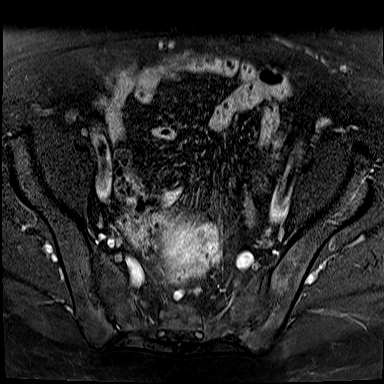

[Series 11: post t1_tse cor · coronal · 4.0mm · 0.47mm/px · 1 of 29 slices shown]
[im 1/29]
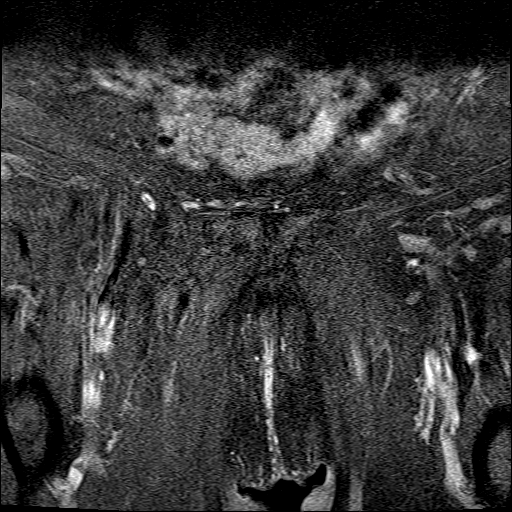

[40 of 48 positions shown; findings below may reference images not displayed]

FINDINGS: TUMOR LOCATION

Tumor distance from Anal Verge/Skin Surface:  11-13 cm

Tumor distance from Internal Anal Sphincter:  7-9 cm

TUMOR DESCRIPTION

Circumferential Extent: Difficult to assess due to tortuosity of the
rectum and rectosigmoid transition. Appears eccentric favoring the
posterior wall of the rectum. Lesion is nonobstructive. Due to
tortuosity orthogonal planes passing through the axis of the tumor
are were difficult to prescribe and acquire.

Tumor Length: 3.4 cm

T - CATEGORY

Extension through Muscularis Propria: 3 mm extension beyond the
posterior rectal wall (image [DATE])

Shortest Distance of any tumor/node from Mesorectal Fascia: 11 mm
along the posterior high mesorectum. Tumor is situated at the level
of the anterior peritoneal reflection. Mm

Extramural Vascular Invasion/Tumor Thrombus: Question early EMVI
along the posterior aspect of the rectum based on serpiginous
appearance of low signal adjacent to the area of extension beyond
the mesorectum.

Invasion of Anterior Peritoneal Reflection: No

Involvement of Adjacent Organs or Pelvic Sidewall: No

Levator Ani Involvement: No

N - CATEGORY

Mesorectal Lymph Nodes >=5mm: None

Extra-mesorectal Lymphadenopathy: None visualized.

Other:

Urinary Tract: No distal ureteral dilation. Urinary bladder under
distended.

Bowel: High rectal tumor near the rectosigmoid junction as
discussed.

Vascular/Lymphatic: Normal caliber of pelvic vessels. No adenopathy
in the pelvis.

Reproductive: Post hysterectomy.

Other: No ascites

Musculoskeletal: No acute or destructive bone process.
IMPRESSION: Limited evaluation due to rectal spasm and tortuosity of the rectum
near the rectosigmoid junction. High rectal tumor compatible with T3
B disease showing potential for early EMVI.

No signs of nodal disease in the pelvis.

## 2022-03-30 MED ORDER — GADOBENATE DIMEGLUMINE 529 MG/ML IV SOLN
18.0000 mL | Freq: Once | INTRAVENOUS | Status: AC | PRN
  Administered 2022-03-30: 18 mL via INTRAVENOUS

## 2022-04-01 IMAGING — MR MR PELVIS WO/W CM
4 of 7 series · 22 of 48 positions shown · IV contrast (multihance)
Comparison: CT of the abdomen and pelvis from [DATE].

CLINICAL DATA: Rectal neoplasm. A 75-year-old presenting with known
rectal neoplasm found at endoscopy reportedly 12 cm from the anal
verge.

EXAM:
MRI PELVIS WITHOUT AND WITH CONTRAST
TECHNIQUE: Multiplanar multisequence MR imaging of the pelvis was performed
both before and after administration of intravenous contrast.
Ultrasound gel was administered per rectum to optimize tumor
evaluation.
CONTRAST:  18mL MULTIHANCE GADOBENATE DIMEGLUMINE 529 MG/ML IV SOLN

[Series 3: T2 · sagittal · 3.0mm · 0.75mm/px · 6 of 36 slices shown (1 of 3)]
[im 1/36]
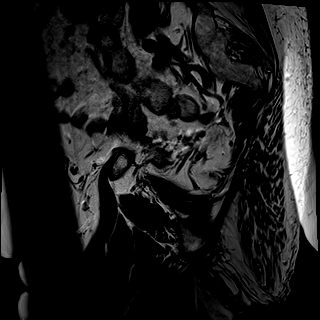
[im 8/36]
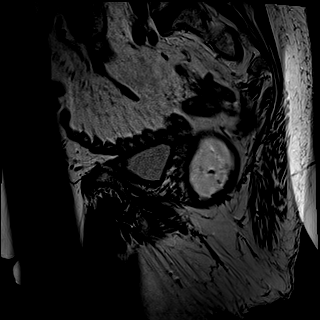
[im 15/36]
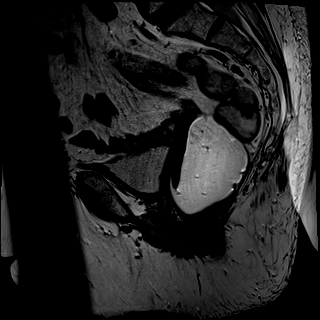
[im 22/36]
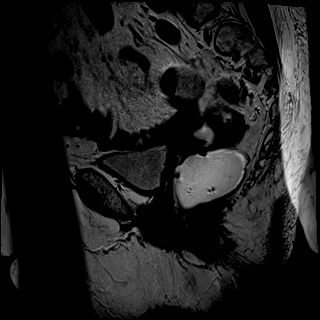
[im 29/36]
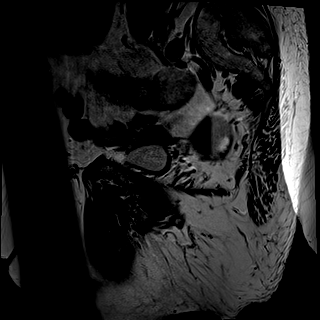
[im 36/36]
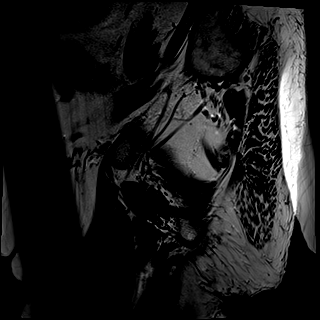

[Series 4: T2 · axial · 5.0mm · 0.59mm/px · z∈[-20,+184]mm · 5 of 35 slices shown (2 of 3)]
[im 1/35]
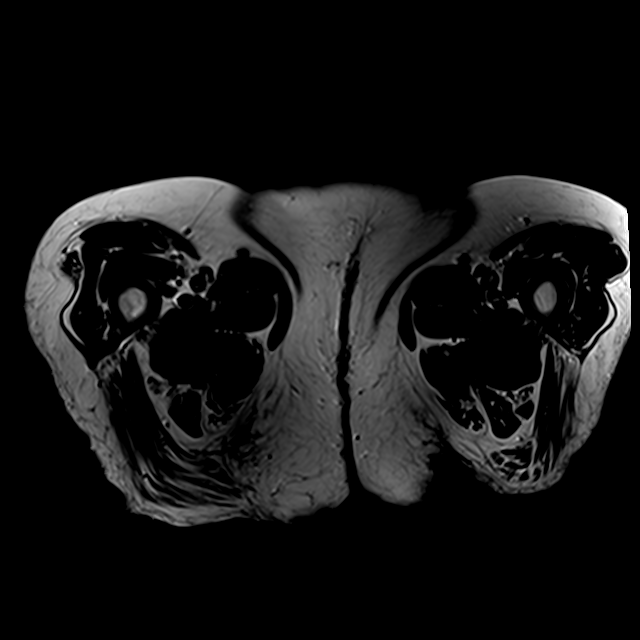
[im 9/35]
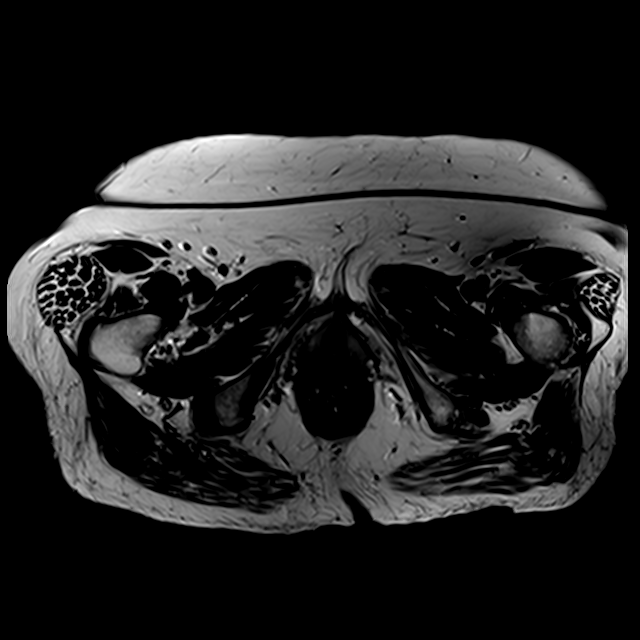
[im 18/35]
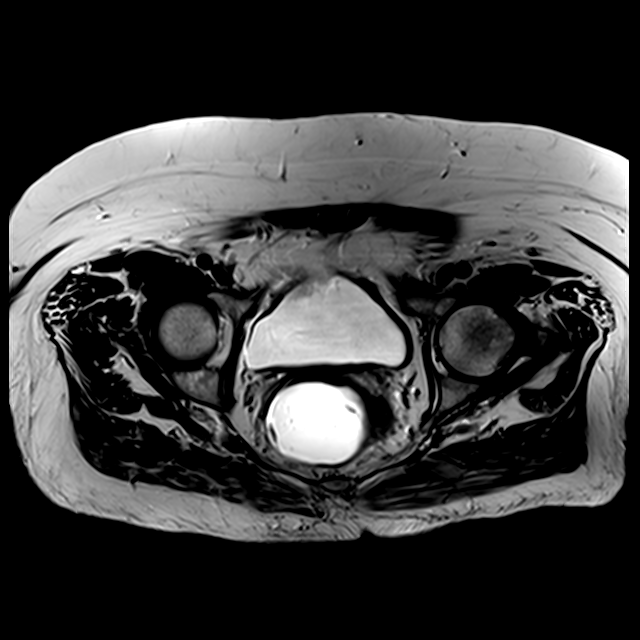
[im 26/35]
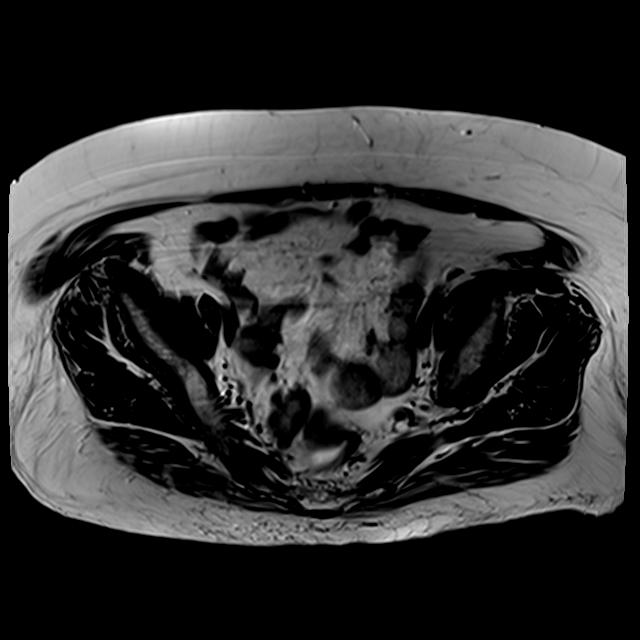
[im 35/35]
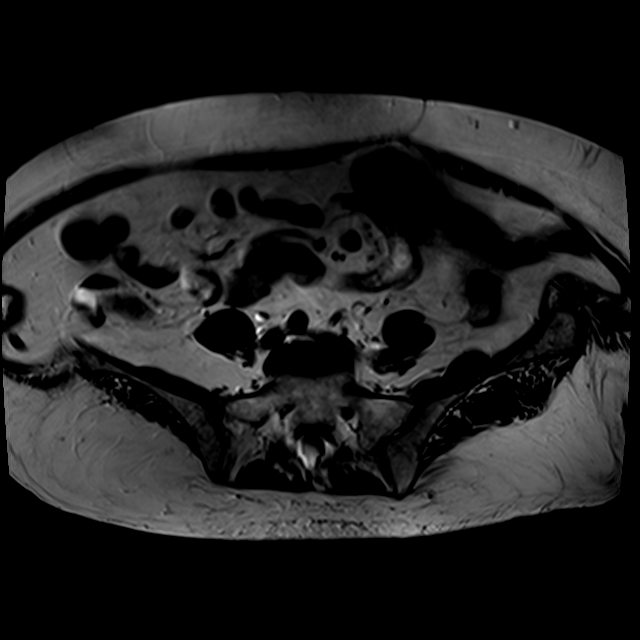

[Series 5: T2 · coronal · 3.0mm · 0.38mm/px · 5 of 38 slices shown (3 of 3)]
[im 1/38]
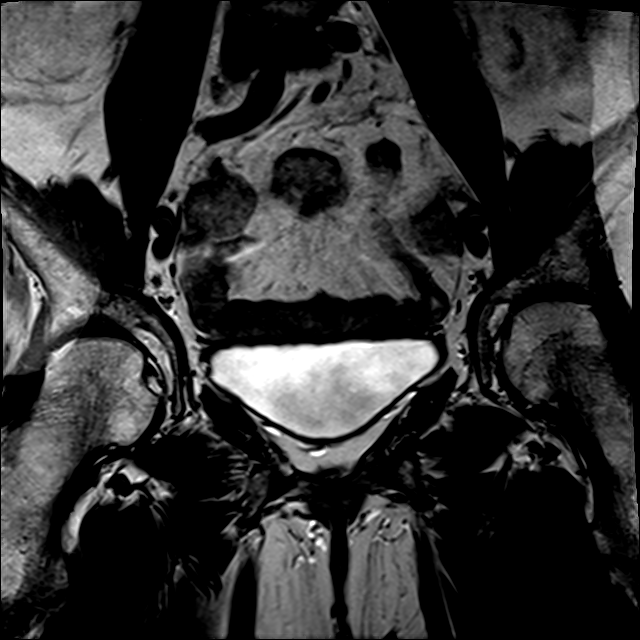
[im 10/38]
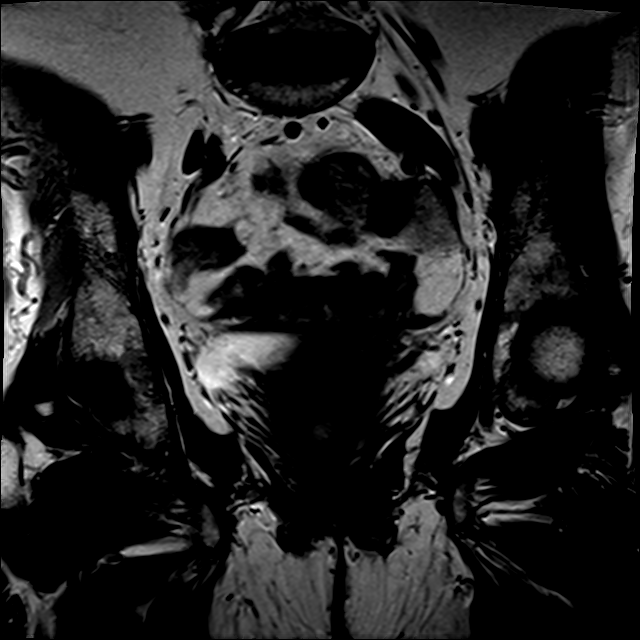
[im 19/38]
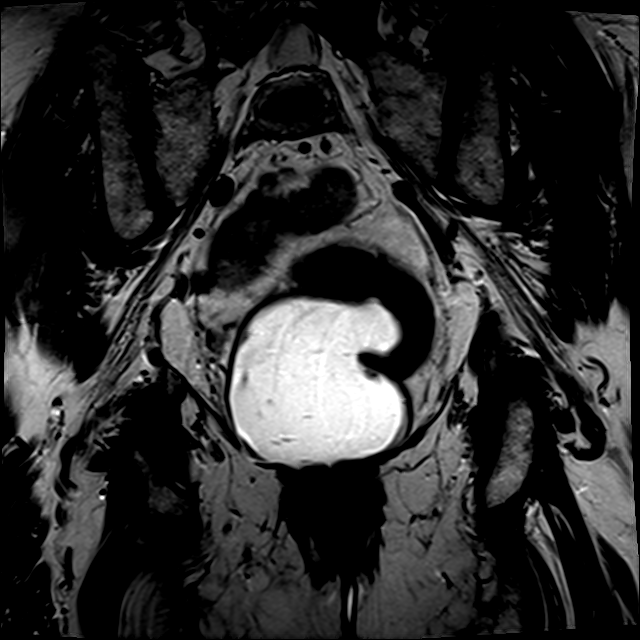
[im 28/38]
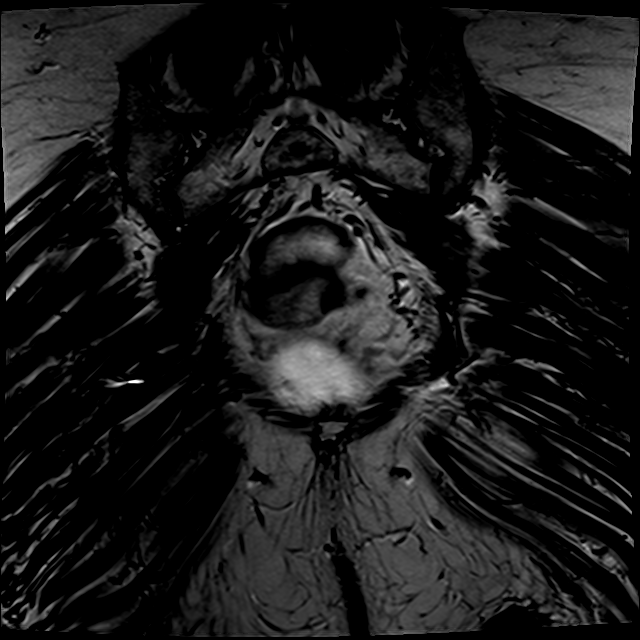
[im 38/38]
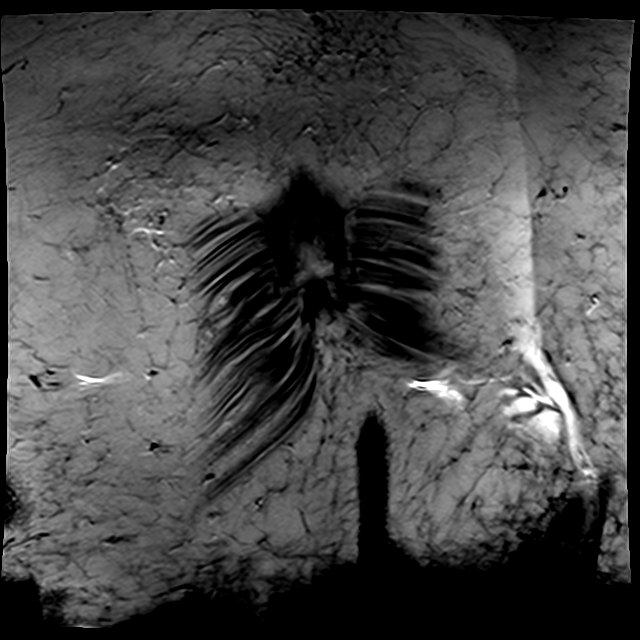

[Series 6: DWI · axial · 5.0mm · 1.42mm/px · z∈[-23,+157]mm · 6 of 105 slices shown]
[im 1/105]
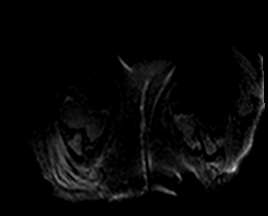
[im 15/105]
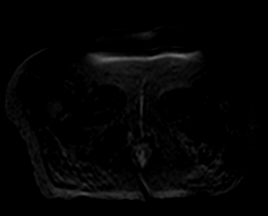
[im 30/105]
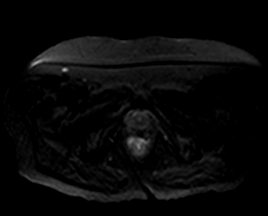
[im 45/105]
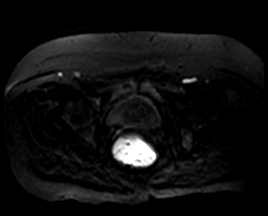
[im 53/105]
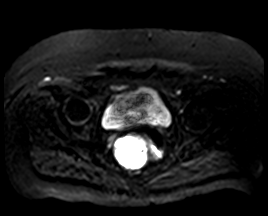
[im 90/105]
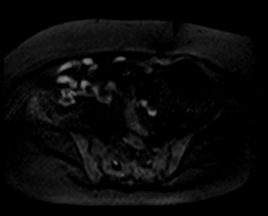

[22 of 48 positions shown; findings below may reference images not displayed]

FINDINGS: TUMOR LOCATION

Tumor distance from Anal Verge/Skin Surface:  11-13 cm

Tumor distance from Internal Anal Sphincter:  7-9 cm

TUMOR DESCRIPTION

Circumferential Extent: Difficult to assess due to tortuosity of the
rectum and rectosigmoid transition. Appears eccentric favoring the
posterior wall of the rectum. Lesion is nonobstructive. Due to
tortuosity orthogonal planes passing through the axis of the tumor
are were difficult to prescribe and acquire.

Tumor Length: 3.4 cm

T - CATEGORY

Extension through Muscularis Propria: 3 mm extension beyond the
posterior rectal wall (image [DATE])

Shortest Distance of any tumor/node from Mesorectal Fascia: 11 mm
along the posterior high mesorectum. Tumor is situated at the level
of the anterior peritoneal reflection. Mm

Extramural Vascular Invasion/Tumor Thrombus: Question early EMVI
along the posterior aspect of the rectum based on serpiginous
appearance of low signal adjacent to the area of extension beyond
the mesorectum.

Invasion of Anterior Peritoneal Reflection: No

Involvement of Adjacent Organs or Pelvic Sidewall: No

Levator Ani Involvement: No

N - CATEGORY

Mesorectal Lymph Nodes >=5mm: None

Extra-mesorectal Lymphadenopathy: None visualized.

Other:

Urinary Tract: No distal ureteral dilation. Urinary bladder under
distended.

Bowel: High rectal tumor near the rectosigmoid junction as
discussed.

Vascular/Lymphatic: Normal caliber of pelvic vessels. No adenopathy
in the pelvis.

Reproductive: Post hysterectomy.

Other: No ascites

Musculoskeletal: No acute or destructive bone process.
IMPRESSION: Limited evaluation due to rectal spasm and tortuosity of the rectum
near the rectosigmoid junction. High rectal tumor compatible with T3
B disease showing potential for early EMVI.

No signs of nodal disease in the pelvis.

## 2022-04-03 ENCOUNTER — Telehealth: Payer: Self-pay | Admitting: *Deleted

## 2022-04-06 ENCOUNTER — Other Ambulatory Visit: Payer: HMO

## 2022-04-07 ENCOUNTER — Encounter: Payer: Self-pay | Admitting: *Deleted

## 2022-04-07 ENCOUNTER — Other Ambulatory Visit: Payer: Self-pay | Admitting: *Deleted

## 2022-04-07 ENCOUNTER — Inpatient Hospital Stay: Payer: PPO | Attending: Oncology | Admitting: Oncology

## 2022-04-07 ENCOUNTER — Other Ambulatory Visit (HOSPITAL_COMMUNITY): Payer: Self-pay

## 2022-04-07 VITALS — BP 124/65 | HR 86 | Temp 97.9°F | Resp 18 | Ht 63.0 in | Wt 190.2 lb

## 2022-04-07 DIAGNOSIS — C20 Malignant neoplasm of rectum: Secondary | ICD-10-CM

## 2022-04-07 MED ORDER — CAPECITABINE 500 MG PO TABS
ORAL_TABLET | ORAL | 0 refills | Status: DC
  Filled 2022-04-07: qty 168, fill #0

## 2022-04-07 NOTE — Progress Notes (Signed)
PATIENT NAVIGATOR PROGRESS NOTE  Name: Destiny [name] Date: 04/07/2022 MRN: [id number]  DOB: 28-Jan-1946   Reason for visit:  Initial visit with Dr Truett Perna  Comments:  Met with [name] during new patient appt with Dr Truett Perna Discussion regarding Neoadjuvant vs Adjuvant chemo Given information on Capecitabine Referral placed to Radiation Oncology Will be presented at GI conference on Wednesday, 6/28 Dressing on left heal removed and Dr Truett Perna examined. New dressing applied Colonoscopy biopsy accession number [id number] sent for [facility] testing per Dr Nonie Hoyer information given and encouraged pt to call with any issues or questions    Time spent counseling/coordinating care: > 60 minutes

## 2022-04-08 ENCOUNTER — Telehealth: Payer: Self-pay | Admitting: Pharmacy Technician

## 2022-04-08 ENCOUNTER — Other Ambulatory Visit (HOSPITAL_COMMUNITY): Payer: Self-pay

## 2022-04-08 ENCOUNTER — Telehealth: Payer: Self-pay | Admitting: Pharmacist

## 2022-04-08 DIAGNOSIS — C20 Malignant neoplasm of rectum: Secondary | ICD-10-CM

## 2022-04-08 MED ORDER — CAPECITABINE 500 MG PO TABS
1500.0000 mg | ORAL_TABLET | Freq: Two times a day (BID) | ORAL | 0 refills | Status: DC
  Filled 2022-04-08: qty 168, 28d supply, fill #0
  Filled 2022-04-09: qty 168, 38d supply, fill #0

## 2022-04-08 NOTE — Telephone Encounter (Addendum)
Oral Oncology Patient Advocate Encounter  After completing a benefits investigation, prior authorization for Capecitabine is not required at this time through [employer].  Patient's copay is $33.95.   [place] [facility] [place] Phone [phone] Fax [phone] 04/08/2022 10:17 AM

## 2022-04-08 NOTE — Telephone Encounter (Signed)
Oral Oncology Pharmacist Encounter  Received new prescription for Xeloda (capecitabine) for the treatment of stage II rectal cancer in conjunction with radiation, planned duration until the end of radiation.  CMP from 03/06/22 assessed, no relevant lab abnormalities. Prescription dose and frequency assessed.   Current medication list in Epic reviewed, no DDIs with capecitabine identified.  Evaluated chart and no patient barriers to medication adherence identified.   Prescription has been e-scribed to the [facility] for benefits analysis and approval.  Oral Oncology Clinic will continue to follow for insurance authorization, copayment issues, initial counseling and start date.  Patient agreed to treatment on 04/07/22 per MD documentation.  Darl Pikes, PharmD, BCPS, BCOP, CPP Hematology/Oncology Clinical Pharmacist Practitioner [place]/[facility] [phone]  04/08/2022 10:28 AM

## 2022-04-09 ENCOUNTER — Other Ambulatory Visit: Payer: Self-pay

## 2022-04-09 ENCOUNTER — Ambulatory Visit
Admission: RE | Admit: 2022-04-09 | Discharge: 2022-04-09 | Disposition: A | Discharge status: Home / Self Care | Payer: HMO | Source: Ambulatory Visit | Attending: Radiation Oncology | Admitting: Radiation Oncology

## 2022-04-09 ENCOUNTER — Encounter: Payer: Self-pay | Admitting: *Deleted

## 2022-04-09 ENCOUNTER — Other Ambulatory Visit (HOSPITAL_COMMUNITY): Payer: Self-pay

## 2022-04-09 ENCOUNTER — Encounter: Payer: Self-pay | Admitting: Radiation Oncology

## 2022-04-09 VITALS — Ht 63.0 in | Wt 190.0 lb

## 2022-04-09 DIAGNOSIS — C20 Malignant neoplasm of rectum: Secondary | ICD-10-CM | POA: Diagnosis not present

## 2022-04-09 NOTE — Progress Notes (Signed)
Radiation Oncology         ([phone]) [phone] ________________________________  Name: Destiny [name]        MRN: [id number]  Date of Service: 04/09/2022 DOB: January 07, 1946  GE:XBMWU, Thayer Jew, MD  Ladell Pier, MD     REFERRING PHYSICIAN: Ladell Pier, MD   DIAGNOSIS: The encounter diagnosis was Rectal cancer [facility]).   HISTORY OF PRESENT ILLNESS: Destiny [name] is a 76 y.o. female seen at the request of Dr. Benay Spice for a new diagnosis of rectal cancer. She was seen due to a positive cologuard which led to a work up. She had a colonoscopy on 03/06/22 that showed a nonobstructing mass in the rectum about 12 cm from the anal verge. The mass was biopsied and revealed moderately differentiated adenocarcinoma arising within a tubular adenoma with high grade dysplasia. CT staging on 03/13/22 showed small calcified lung nodules without evidence of metastatic disease. A Pelvic MRI on 03/30/22 showed a tumor measuring 3.4 cm staged as T3bN0 with possible vascular invasion of the posterior rectum. Her case was discussed in Multidisciplinary GI Oncology conference today and recommendations were for chemoRT followed by surgical resection. She's seen today via MyChart to discuss this.    PREVIOUS RADIATION THERAPY: No   PAST MEDICAL HISTORY:  Past Medical History:  Diagnosis Date   Allergy    Ankle dislocation, left, initial encounter 09/23/2017   Arthritis    Blood transfusion    in 1980   Cancer ([place])    left heel melanoma   Diabetes mellitus    History of kidney stones    Hypothyroidism    Osteoporosis    osteopenia   PONV (postoperative nausea and vomiting)    Sleep apnea    does not use cpap   Thyroid disease    hypothyroidism       PAST SURGICAL HISTORY: Past Surgical History:  Procedure Laterality Date   APPLICATION OF A-CELL OF CHEST/ABDOMEN Left 04/24/2021   Procedure: APPLICATION OF A-CELL LEFT FOOT;  Surgeon: Wallace Going, DO;  Location: [place];  Service: Plastics;   Laterality: Left;   APPLICATION OF A-CELL OF CHEST/ABDOMEN Left 05/22/2021   Procedure: POSSIBLE APPLICATION OF A-CELL;  Surgeon: Wallace Going, DO;  Location: [place];  Service: Plastics;  Laterality: Left;   COLONOSCOPY     EXCISION MELANOMA WITH SENTINEL LYMPH NODE BIOPSY Left 03/26/2021   Procedure: WIDE LOCAL EXCISION MELANOMA LEFT HEEL, SENTINEL LYMPH NODE MAPPING AND BIOPSY;  Surgeon: [name], MD;  Location: [place];  Service: General;  Laterality: Left;   EXTERNAL FIXATION LEG Left 09/24/2017   Procedure: EXTERNAL FIXATION ankle;  Surgeon: Shona Needles, MD;  Location: [place];  Service: Orthopedics;  Laterality: Left;   EXTERNAL FIXATION REMOVAL Left 09/28/2017   Procedure: REMOVAL EXTERNAL FIXATION LEG;  Surgeon: Shona Needles, MD;  Location: [place];  Service: Orthopedics;  Laterality: Left;   EYE SURGERY Bilateral    cataract   LAPAROSCOPIC GASTRIC BANDING     MELANOMA EXCISION Left 04/24/2021   Procedure: REEXCISION MARGIN LEFT HEEL MELANOMA;  Surgeon: [name], MD;  Location: [place];  Service: General;  Laterality: Left;   MELANOMA EXCISION Left 05/22/2021   Procedure: RE-EXCISION OF LEFT HEEL MELANOMA;  Surgeon: [name], MD;  Location: [place];  Service: General;  Laterality: Left;   OPEN REDUCTION INTERNAL FIXATION (ORIF) DISTAL RADIAL FRACTURE Right 07/29/2016   Procedure: RIGHT OPEN REDUCTION INTERNAL FIXATION (ORIF) DISTAL RADIAL FRACTURE;  Surgeon: Leanora Cover,  MD;  Location: [place];  Service: Orthopedics;  Laterality: Right;   ORIF ANKLE FRACTURE Left 09/28/2017   Procedure: OPEN REDUCTION INTERNAL FIXATION (ORIF) ANKLE FRACTURE;  Surgeon: Shona Needles, MD;  Location: [place];  Service: Orthopedics;  Laterality: Left;   POLYPECTOMY     SKIN DEBRIDEMENT Left 05/22/2021   Procedure: RECONSTRUCTION LEFT HEEL;  Surgeon: Wallace Going, DO;  Location: [place];  Service: Plastics;  Laterality: Left;   SKIN FULL THICKNESS GRAFT Left 04/24/2021    Procedure: RECONSTRUCTION OF LEFT HEEL;  Surgeon: Wallace Going, DO;  Location: [place];  Service: Plastics;  Laterality: Left;   SKIN FULL THICKNESS GRAFT Left 05/22/2021   Procedure: POSSIBLE SKIN GRAFT;  Surgeon: Wallace Going, DO;  Location: [place];  Service: Plastics;  Laterality: Left;   TOTAL ABDOMINAL HYSTERECTOMY W/ BILATERAL SALPINGOOPHORECTOMY     WISDOM TOOTH EXTRACTION       FAMILY HISTORY:  Family History  Problem Relation Age of Onset   Lung cancer Sister    Ovarian cancer Sister    Throat cancer Brother      SOCIAL HISTORY:  reports that she has quit smoking. She has never used smokeless tobacco. She reports that she does not drink alcohol and does not use drugs.   ALLERGIES: Anesthesia s-i-40 [propofol], Aspirin, Atorvastatin, Fentanyl, Lisinopril, Penicillins, and Hydrocodone   MEDICATIONS:  Current Outpatient Medications  Medication Sig Dispense Refill   acetaminophen (TYLENOL) 500 MG tablet Take 1,000 mg by mouth every 6 (six) hours as needed for moderate pain.     aspirin EC 81 MG tablet Take 81 mg by mouth in the morning. (Patient not taking: Reported on 04/07/2022)     capecitabine (XELODA) 500 MG tablet Take 3 tablets (1,500 mg total) by mouth 2 (two) times daily after a meal. Take Monday-Friday. Take only on days of radiation. 168 tablet 0   Cholecalciferol (VITAMIN D3) 50 MCG (2000 UT) TABS Take 2,000 Units by mouth in the morning. (Patient not taking: Reported on 04/07/2022)     denosumab (PROLIA) 60 MG/ML SOSY injection Inject 60 mg into the skin every 6 (six) months.     ezetimibe (ZETIA) 10 MG tablet Take 10 mg by mouth in the morning.     levothyroxine (SYNTHROID) 100 MCG tablet Take 100 mcg by mouth every morning.     metFORMIN (GLUCOPHAGE-XR) 500 MG 24 hr tablet Take 1,000 mg by mouth 2 (two) times daily.  2   Multiple Vitamins-Minerals (OCUVITE EXTRA) TABS Take 1 tablet by mouth in the morning and at bedtime.     ONETOUCH ULTRA test  strip      pravastatin (PRAVACHOL) 40 MG tablet Take 40 mg by mouth every evening.     vitamin B-12 (CYANOCOBALAMIN) 1000 MCG tablet Take 1,000 mcg by mouth in the morning.     No current facility-administered medications for this encounter.     REVIEW OF SYSTEMS: On review of systems, the patient reports that she is doing well. She denies any difficulty with her stools, but has occasional blood per rectum with wiping. She reports some fullness and pressure in the pelvis. She denies any frank pain or nausea, vomiting. No other complaints are verbalized.      PHYSICAL EXAM:  Unable to assess due to encounter type  In general this is a well appearing caucasian female in no acute distress. She's alert and oriented x4 and appropriate throughout the examination. Cardiopulmonary assessment is negative for acute  distress and she exhibits normal effort.     ECOG = 1  0 - Asymptomatic (Fully active, able to carry on all predisease activities without restriction)  1 - Symptomatic but completely ambulatory (Restricted in physically strenuous activity but ambulatory and able to carry out work of a light or sedentary nature. For example, light housework, office work)  2 - Symptomatic, <50% in bed during the day (Ambulatory and capable of all self care but unable to carry out any work activities. Up and about more than 50% of waking hours)  3 - Symptomatic, >50% in bed, but not bedbound (Capable of only limited self-care, confined to bed or chair 50% or more of waking hours)  4 - Bedbound (Completely disabled. Cannot carry on any self-care. Totally confined to bed or chair)  5 - Death   Eustace Pen MM, Creech RH, Tormey DC, et al. [phone]). "Toxicity and response criteria of the [facility]". [place] Oncol. 5 (6): 649-55    LABORATORY DATA:  Lab Results  Component Value Date   WBC 5.7 03/06/2022   HGB 11.6 (L) 03/06/2022   HCT 36.1 03/06/2022   MCV 84.9 03/06/2022    PLT 278.0 03/06/2022   Lab Results  Component Value Date   NA 143 03/06/2022   K 4.9 03/06/2022   CL 104 03/06/2022   CO2 32 03/06/2022   Lab Results  Component Value Date   ALT 7 03/06/2022   AST 12 03/06/2022   ALKPHOS 51 03/06/2022   BILITOT 0.6 03/06/2022      RADIOGRAPHY: MR PELVIS W WO CONTRAST  Result Date: 04/01/2022 CLINICAL DATA:  Rectal neoplasm. A 76 year old presenting with known rectal neoplasm found at endoscopy reportedly 12 cm from the anal verge. EXAM: MRI PELVIS WITHOUT AND WITH CONTRAST TECHNIQUE: Multiplanar multisequence MR imaging of the pelvis was performed both before and after administration of intravenous contrast. Ultrasound gel was administered per rectum to optimize tumor evaluation. CONTRAST:  55m MULTIHANCE GADOBENATE DIMEGLUMINE 529 MG/ML IV SOLN COMPARISON:  CT of the abdomen and pelvis from March 13, 2022. FINDINGS: TUMOR LOCATION Tumor distance from Anal Verge/Skin Surface:  11-13 cm Tumor distance from Internal Anal Sphincter:  7-9 cm TUMOR DESCRIPTION Circumferential Extent: Difficult to assess due to tortuosity of the rectum and rectosigmoid transition. Appears eccentric favoring the posterior wall of the rectum. Lesion is nonobstructive. Due to tortuosity orthogonal planes passing through the axis of the tumor are were difficult to prescribe and acquire. Tumor Length: 3.4 cm T - CATEGORY Extension through Muscularis Propria: 3 mm extension beyond the posterior rectal wall (image 14/8) Shortest Distance of any tumor/node from Mesorectal Fascia: 11 mm along the posterior high mesorectum. Tumor is situated at the level of the anterior peritoneal reflection. Mm Extramural Vascular Invasion/Tumor Thrombus: Question early EMVI along the posterior aspect of the rectum based on serpiginous appearance of low signal adjacent to the area of extension beyond the mesorectum. Invasion of Anterior Peritoneal Reflection: No Involvement of Adjacent Organs or Pelvic Sidewall:  No Levator Ani Involvement: No N - CATEGORY Mesorectal Lymph Nodes >=522m None Extra-mesorectal Lymphadenopathy: None visualized. Other: Urinary Tract: No distal ureteral dilation. Urinary bladder under distended. Bowel: High rectal tumor near the rectosigmoid junction as discussed. Vascular/Lymphatic: Normal caliber of pelvic vessels. No adenopathy in the pelvis. Reproductive: Post hysterectomy. Other: No ascites Musculoskeletal: No acute or destructive bone process. IMPRESSION: Limited evaluation due to rectal spasm and tortuosity of the rectum near the rectosigmoid junction. High rectal tumor compatible  with T3 B disease showing potential for early EMVI. No signs of nodal disease in the pelvis. Electronically Signed   By: Zetta Bills M.D.   On: 04/01/2022 14:23   CT Abdomen Pelvis W Contrast  Result Date: 03/13/2022 CLINICAL DATA:  Rectal adenocarcinoma EXAM: CT CHEST, ABDOMEN, AND PELVIS WITH CONTRAST TECHNIQUE: Multidetector CT imaging of the chest, abdomen and pelvis was performed following the standard protocol during bolus administration of intravenous contrast. RADIATION DOSE REDUCTION: This exam was performed according to the departmental dose-optimization program which includes automated exposure control, adjustment of the mA and/or kV according to patient size and/or use of iterative reconstruction technique. CONTRAST:  127m OMNIPAQUE IOHEXOL 300 MG/ML  SOLN COMPARISON:  None Available. FINDINGS: CT CHEST FINDINGS Cardiovascular: 2 vessel aortic arch. The right brachiocephalic and left common carotid artery share a common origin. Atherosclerotic calcifications present throughout the thoracic aorta and along the coronary arteries. The heart is normal in size. No pericardial effusion. Mediastinum/Nodes: Unremarkable visualized thyroid gland. No mediastinal mass or adenopathy. Surgical changes of prior lap band. Lungs/Pleura: Punctate calcified subpleural nodule in the periphery of the right upper  lobe (image 31 series 505). Tiny calcified pulmonary nodule in the periphery of the right upper lobe (image 38 series 505). Small 4 mm nodular opacity in the periphery of the superior segment of the left lower lobe (image 44 series 505). Additional areas of linear atelectasis versus scarring present in the lingula and left lower lobe. Mild bronchitic changes. Musculoskeletal: No acute fracture or aggressive appearing lytic or blastic osseous lesion. Probable T8 hemangioma. CT ABDOMEN PELVIS FINDINGS Hepatobiliary: No focal liver abnormality is seen. No gallstones, gallbladder wall thickening, or biliary dilatation. Pancreas: Unremarkable. No pancreatic ductal dilatation or surrounding inflammatory changes. Spleen: Normal in size without focal abnormality. Adrenals/Urinary Tract: Normal adrenal glands. 1.3 cm nonobstructing stone in the interpolar right renal collecting system. Circumscribed low-attenuation renal lesion exophytic from the interpolar left kidney consistent with a simple cyst. No further follow-up recommended. Ureters and bladder are unremarkable. Stomach/Bowel: Large periampullary duodenal diverticulum. Colonic diverticular disease without CT evidence of active inflammation. Normal appendix in the right lower quadrant. No definite colonic wall thickening identified. Moderate colonic stool burden. Vascular/Lymphatic: Atherosclerotic calcifications throughout the abdominal aorta. No evidence of aneurysm. No suspicious lymphadenopathy. Reproductive: Status post hysterectomy. No adnexal masses. Other: No abdominal wall hernia or abnormality. No abdominopelvic ascites. Musculoskeletal: No acute fracture or aggressive appearing lytic or blastic osseous lesion. IMPRESSION: 1. No visible colonic wall thickening or mass. No evidence of metastatic disease in the chest, abdomen or pelvis. 2. Small 2-4 mm pulmonary nodules at least 2 of which are calcified and almost certainly benign granulomas. No follow-up  needed if patient is low-risk (and has no known or suspected primary neoplasm). Non-contrast chest CT can be considered in 12 months if patient is high-risk. This recommendation follows the consensus statement: Guidelines for Management of Incidental Pulmonary Nodules Detected on CT Images: From the Fleischner Society 2017; Radiology 2017; 284:228-243. 3. Nonobstructing stone in the interpolar right renal collecting system. 4. Colonic diverticular disease without CT evidence of active inflammation. 5.  Aortic Atherosclerosis (ICD10-I70.0). 6. Surgical changes of prior lap band procedure. 7. Additional ancillary findings as above. Electronically Signed   By: HJacqulynn CadetM.D.   On: 03/13/2022 14:38   CT CHEST W CONTRAST  Result Date: 03/13/2022 CLINICAL DATA:  Rectal adenocarcinoma EXAM: CT CHEST, ABDOMEN, AND PELVIS WITH CONTRAST TECHNIQUE: Multidetector CT imaging of the chest, abdomen and pelvis was performed  following the standard protocol during bolus administration of intravenous contrast. RADIATION DOSE REDUCTION: This exam was performed according to the departmental dose-optimization program which includes automated exposure control, adjustment of the mA and/or kV according to patient size and/or use of iterative reconstruction technique. CONTRAST:  152m OMNIPAQUE IOHEXOL 300 MG/ML  SOLN COMPARISON:  None Available. FINDINGS: CT CHEST FINDINGS Cardiovascular: 2 vessel aortic arch. The right brachiocephalic and left common carotid artery share a common origin. Atherosclerotic calcifications present throughout the thoracic aorta and along the coronary arteries. The heart is normal in size. No pericardial effusion. Mediastinum/Nodes: Unremarkable visualized thyroid gland. No mediastinal mass or adenopathy. Surgical changes of prior lap band. Lungs/Pleura: Punctate calcified subpleural nodule in the periphery of the right upper lobe (image 31 series 505). Tiny calcified pulmonary nodule in the periphery  of the right upper lobe (image 38 series 505). Small 4 mm nodular opacity in the periphery of the superior segment of the left lower lobe (image 44 series 505). Additional areas of linear atelectasis versus scarring present in the lingula and left lower lobe. Mild bronchitic changes. Musculoskeletal: No acute fracture or aggressive appearing lytic or blastic osseous lesion. Probable T8 hemangioma. CT ABDOMEN PELVIS FINDINGS Hepatobiliary: No focal liver abnormality is seen. No gallstones, gallbladder wall thickening, or biliary dilatation. Pancreas: Unremarkable. No pancreatic ductal dilatation or surrounding inflammatory changes. Spleen: Normal in size without focal abnormality. Adrenals/Urinary Tract: Normal adrenal glands. 1.3 cm nonobstructing stone in the interpolar right renal collecting system. Circumscribed low-attenuation renal lesion exophytic from the interpolar left kidney consistent with a simple cyst. No further follow-up recommended. Ureters and bladder are unremarkable. Stomach/Bowel: Large periampullary duodenal diverticulum. Colonic diverticular disease without CT evidence of active inflammation. Normal appendix in the right lower quadrant. No definite colonic wall thickening identified. Moderate colonic stool burden. Vascular/Lymphatic: Atherosclerotic calcifications throughout the abdominal aorta. No evidence of aneurysm. No suspicious lymphadenopathy. Reproductive: Status post hysterectomy. No adnexal masses. Other: No abdominal wall hernia or abnormality. No abdominopelvic ascites. Musculoskeletal: No acute fracture or aggressive appearing lytic or blastic osseous lesion. IMPRESSION: 1. No visible colonic wall thickening or mass. No evidence of metastatic disease in the chest, abdomen or pelvis. 2. Small 2-4 mm pulmonary nodules at least 2 of which are calcified and almost certainly benign granulomas. No follow-up needed if patient is low-risk (and has no known or suspected primary neoplasm).  Non-contrast chest CT can be considered in 12 months if patient is high-risk. This recommendation follows the consensus statement: Guidelines for Management of Incidental Pulmonary Nodules Detected on CT Images: From the Fleischner Society 2017; Radiology 2017; 284:228-243. 3. Nonobstructing stone in the interpolar right renal collecting system. 4. Colonic diverticular disease without CT evidence of active inflammation. 5.  Aortic Atherosclerosis (ICD10-I70.0). 6. Surgical changes of prior lap band procedure. 7. Additional ancillary findings as above. Electronically Signed   By: HJacqulynn CadetM.D.   On: 03/13/2022 14:38       IMPRESSION/PLAN: 1. Stage II, cT3N0M0 adenocarcinoma of the rectum. Dr. MLisbeth Renshawdiscusses the pathology findings and reviews the nature of rectal carcinoma. We reviewed her case in GI oncology conference this morning and recommendations were to proceed with chemoRT followed by surgical resection with Dr. TMarcello Mooresand additional adjuvant chemotherapy. We discussed the risks, benefits, short, and long term effects of radiotherapy, as well as the curative intent, and the patient is interested in proceeding. Dr. MLisbeth Renshawdiscusses the delivery and logistics of radiotherapy and anticipates a course of 5 1/2 weeks of radiotherapy. We will  see her back a few weeks after surgery to discuss the simulation process and anticipate we starting radiotherapy about 4-6 weeks after surgery.   This encounter was provided by telemedicine platform MyChart.  The patient has provided two factor identification and has given verbal consent for this type of encounter and has been advised to only accept a meeting of this type in a secure network environment. The time spent during this encounter was 60 minutes including preparation, discussion, and coordination of the patient's care. The attendants for this meeting include Blenda Nicely, RN, Dr. Lisbeth Renshaw, Hayden Pedro  and [name] and her son Faithlynn Deeley.  During the encounter,  Blenda Nicely, RN, Dr. Lisbeth Renshaw, and Hayden Pedro were located at [facility] Radiation Oncology Department.  [name] was located at home, and her son Carmon Brigandi was located remotely from work in [place].   This encounter was provided by telemedicine platform MyChart.  The patient has provided two factor identification and has given verbal consent for this type of encounter and has been advised to only accept a meeting of this type in a secure network environment. The time spent during this encounter was 60 minutes including preparation, discussion, and coordination of the patient's care. The attendants for this meeting include Blenda Nicely, RN, Dr. Lisbeth Renshaw, Hayden Pedro  and [name] and her son Magdelyn Roebuck.  During the encounter,  Blenda Nicely, RN, Dr. Lisbeth Renshaw, and Hayden Pedro were located at [facility] Radiation Oncology Department.  [name] was located at home, and her son Jilene Spohr was located remotely from work in [place].    The above documentation reflects my direct findings during this shared patient visit. Please see the separate note by Dr. Lisbeth Renshaw on this date for the remainder of the patient's plan of care.    Carola Rhine, [facility]   **Disclaimer: This note was dictated with voice recognition software. Similar sounding words can inadvertently be transcribed and this note may contain transcription errors which may not have been corrected upon publication of note.**

## 2022-04-09 NOTE — Telephone Encounter (Signed)
Oral Chemotherapy Pharmacist Encounter  Destiny [name] plans on picking up her capecitabine from [place] today 04/09/22. She knows not to start until her first day of radiation.  Patient Education I spoke with patient for overview of new oral chemotherapy medication: Xeloda (capecitabine) for the adjuvant treatment of stage II rectal cancer in conjunction with radiation, planned duration until the end of radiation.   Counseled patient on administration, dosing, side effects, monitoring, drug-food interactions, safe handling, storage, and disposal. Patient will take 3 tablets (1,500 mg total) by mouth 2 (two) times daily after a meal. Take Monday-Friday. Take only on days of radiation.  Side effects include but not limited to: diarrhea, hand-foot syndrome, mouth sores, edema, decreased wbc, fatigue, N/V Diarrhea: patient will keep loperamide on hand to use as needed. She knows to call the office if she is having 4 or more loose stools Hand-foot syndrome: patient plans on picking up some [place] Smooth Extra Care 20 from the pharmacy Mouth sores: patient knows to call for magic mouthwash if needed.    Reviewed with patient importance of keeping a medication schedule and plan for any missed doses.  After discussion with patient no patient barriers to medication adherence identified.   Destiny [name] voiced understanding and appreciation. All questions answered. Medication handout provided.  Provided patient with Oral [facility] phone number. Patient knows to call the office with questions or concerns. Oral Chemotherapy Navigation [facility] will continue to follow.  Darl Pikes, PharmD, BCPS, BCOP, CPP Hematology/Oncology Clinical Pharmacist Practitioner [place]/DB/AP [facility] [phone]  04/09/2022 3:00 PM

## 2022-04-09 NOTE — Telephone Encounter (Signed)
Oral Oncology Patient Advocate Encounter  I spoke with Mrs Yeung on 04/09/22 to set up first fill of Capecitabine.  Capecitabine will be filled through [facility] and patient will pick up.   [facility] pharmacy will call 7-10 days before next refill is due to complete adherence call and set up delivery of medication.     [name] CPHT Specialty Pharmacy Patient Advocate [facility] Phone [phone] Fax [phone] 04/10/2022 10:21 AM

## 2022-04-09 NOTE — Progress Notes (Signed)
The proposed treatment discussed in conference is for discussion purpose only and is not a binding recommendation.  The patients have not been physically examined, or presented with their treatment options.  Therefore, final treatment plans cannot be decided.  

## 2022-04-09 NOTE — Progress Notes (Signed)
PATIENT NAVIGATOR PROGRESS NOTE  Name: Destiny [name] Date: 04/09/2022 MRN: [id number]  DOB: Apr 05, 1946   Reason for visit:   Telephone call  Comments:  Spoke with [name] this am after GI conference to inform her of discussion. She has appt with Dr Lisbeth Renshaw this afternoon for radiation Plan is for Xeloda/Radiation neoadjuvantly and then proceed with surgery.  She RTC here on 7/10 but encouraged her to call with any issues or questions    Time spent counseling/coordinating care: 45-60 minutes

## 2022-04-09 NOTE — Progress Notes (Addendum)
GI Location of Tumor / Histology: Rectal Cancer  Destiny [name] presented to Dr. Tarri Glenn after positive screening cologuard test.  MRI Pelvis 03/30/2022: Limited evaluation due to rectal spasm and tortuosity of the rectum near the rectosigmoid junction. High rectal tumor compatible with T3B disease showing potential for early EMVI.  Tumor distance from Anal Verge/Skin Surface:  11-13 cm Tumor distance from Internal Anal Sphincter:  7-9 cm Tumor Length: 3.4 cm   CT CAP 03/13/2022:  No visible colonic wall thickening or mass. No evidence of metastatic disease in the chest, abdomen or pelvis. Small 2-4 mm pulmonary nodules at least 2 of which are calcified and almost certainly benign granulomas.  Colonoscopy 03/06/2022: Ulcerated non-obstructing mass was found in the rectum at 12 cm from the anal verge.   Biopsies of Rectal Mass 03/06/2022   Past/Anticipated interventions by surgeon, if any:  Dr. Marcello Moores 03/24/2022 -I would like to proceed with an MRI to complete her staging.  -If this shows an early stage rectal cancer or distal sigmoid cancer, I think it would be reasonable to proceed with surgery.  -If this shows a later staged rectal cancer, she may be a candidate for neoadjuvant treatment. I have discussed this with the patient in detail.    Past/Anticipated interventions by medical oncology, if any:  Dr. Benay Spice 04/07/2022 -Her case will be presented at the GI tumor conference on 04/09/2022.  -My initial recommendation is to proceed with neoadjuvant capecitabine and Xeloda.  It would also be reasonable to consider upfront surgery. -We will request IHC testing for mismatch repair protein expression on the rectal biopsy.  She would be a candidate for neoadjuvant immunotherapy if she has an MSI high tumor. -She will be referred for a radiation oncology consult.  I anticipate beginning concurrent capecitabine and radiation within the next 2-3 weeks. -Follow-up 04/21/2022   Weight changes,  if any: No  Bowel/Bladder complaints, if any: No changes noted.  Nausea / Vomiting, if any: No  Pain issues, if any: Has occasional pain with bowels, described as pressure.   Any blood per rectum: Notes occasional smears with wiping.     SAFETY ISSUES: Prior radiation? No Pacemaker/ICD? No Possible current pregnancy? Hysterectomy Is the patient on methotrexate? No  Current Complaints/Details:

## 2022-04-10 ENCOUNTER — Other Ambulatory Visit (HOSPITAL_COMMUNITY): Payer: Self-pay

## 2022-04-10 DIAGNOSIS — C20 Malignant neoplasm of rectum: Secondary | ICD-10-CM | POA: Diagnosis not present

## 2022-04-10 DIAGNOSIS — E785 Hyperlipidemia, unspecified: Secondary | ICD-10-CM | POA: Diagnosis not present

## 2022-04-10 DIAGNOSIS — Z23 Encounter for immunization: Secondary | ICD-10-CM | POA: Diagnosis not present

## 2022-04-10 DIAGNOSIS — E039 Hypothyroidism, unspecified: Secondary | ICD-10-CM | POA: Diagnosis not present

## 2022-04-10 DIAGNOSIS — E11610 Type 2 diabetes mellitus with diabetic neuropathic arthropathy: Secondary | ICD-10-CM | POA: Diagnosis not present

## 2022-04-10 DIAGNOSIS — M810 Age-related osteoporosis without current pathological fracture: Secondary | ICD-10-CM | POA: Diagnosis not present

## 2022-04-10 DIAGNOSIS — M8589 Other specified disorders of bone density and structure, multiple sites: Secondary | ICD-10-CM | POA: Diagnosis not present

## 2022-04-10 NOTE — Progress Notes (Signed)
Addendum, the last sentence of her impression and plan should read that she will proceed with simulation this Friday, and we will plan to proceed with radiotherapy on 04/21/22.     Carola Rhine, PAC

## 2022-04-11 ENCOUNTER — Ambulatory Visit
Admission: RE | Admit: 2022-04-11 | Discharge: 2022-04-11 | Disposition: A | Discharge status: Home / Self Care | Payer: PPO | Source: Ambulatory Visit | Attending: Radiation Oncology | Admitting: Radiation Oncology

## 2022-04-11 ENCOUNTER — Other Ambulatory Visit: Payer: Self-pay

## 2022-04-11 DIAGNOSIS — Z510 Encounter for antineoplastic radiation therapy: Secondary | ICD-10-CM | POA: Insufficient documentation

## 2022-04-11 DIAGNOSIS — C20 Malignant neoplasm of rectum: Secondary | ICD-10-CM | POA: Diagnosis not present

## 2022-04-18 DIAGNOSIS — Z9884 Bariatric surgery status: Secondary | ICD-10-CM | POA: Diagnosis not present

## 2022-04-18 DIAGNOSIS — C20 Malignant neoplasm of rectum: Secondary | ICD-10-CM | POA: Insufficient documentation

## 2022-04-18 DIAGNOSIS — C4372 Malignant melanoma of left lower limb, including hip: Secondary | ICD-10-CM | POA: Diagnosis not present

## 2022-04-18 DIAGNOSIS — E119 Type 2 diabetes mellitus without complications: Secondary | ICD-10-CM | POA: Insufficient documentation

## 2022-04-18 DIAGNOSIS — Z510 Encounter for antineoplastic radiation therapy: Secondary | ICD-10-CM | POA: Insufficient documentation

## 2022-04-21 ENCOUNTER — Other Ambulatory Visit: Payer: Self-pay

## 2022-04-21 ENCOUNTER — Inpatient Hospital Stay: Payer: PPO | Attending: Nurse Practitioner | Admitting: Nurse Practitioner

## 2022-04-21 ENCOUNTER — Encounter: Payer: Self-pay | Admitting: Nurse Practitioner

## 2022-04-21 ENCOUNTER — Ambulatory Visit
Admission: RE | Admit: 2022-04-21 | Discharge: 2022-04-21 | Disposition: A | Discharge status: Home / Self Care | Payer: PPO | Source: Ambulatory Visit | Attending: Radiation Oncology | Admitting: Radiation Oncology

## 2022-04-21 VITALS — BP 142/79 | HR 80 | Temp 98.1°F | Resp 18 | Ht 63.0 in | Wt 189.6 lb

## 2022-04-21 DIAGNOSIS — Z9884 Bariatric surgery status: Secondary | ICD-10-CM | POA: Insufficient documentation

## 2022-04-21 DIAGNOSIS — C4372 Malignant melanoma of left lower limb, including hip: Secondary | ICD-10-CM | POA: Insufficient documentation

## 2022-04-21 DIAGNOSIS — C20 Malignant neoplasm of rectum: Secondary | ICD-10-CM | POA: Diagnosis not present

## 2022-04-21 DIAGNOSIS — Z510 Encounter for antineoplastic radiation therapy: Secondary | ICD-10-CM | POA: Diagnosis not present

## 2022-04-21 DIAGNOSIS — E119 Type 2 diabetes mellitus without complications: Secondary | ICD-10-CM | POA: Insufficient documentation

## 2022-04-21 LAB — RAD ONC ARIA SESSION SUMMARY
Course Elapsed Days: 0
Plan Fractions Treated to Date: 1
Plan Prescribed Dose Per Fraction: 1.8 Gy
Plan Total Fractions Prescribed: 25
Plan Total Prescribed Dose: 45 Gy
Reference Point Dosage Given to Date: 1.8 Gy
Reference Point Session Dosage Given: 1.8 Gy
Session Number: 1

## 2022-04-21 NOTE — Progress Notes (Signed)
  [facility] OFFICE PROGRESS NOTE   Diagnosis: Rectal cancer  INTERVAL HISTORY:   Ms. Neeb returns as scheduled.  She began neoadjuvant radiation/Xeloda earlier today.  She overall feels well.  No nausea or vomiting.  No mouth sores.  No diarrhea.  No hand or foot pain or redness.  Objective:  Vital signs in last 24 hours:  Blood pressure (!) 142/79, pulse 80, temperature 98.1 F (36.7 C), temperature source Oral, resp. rate 18, height '5\' 3"'$  (1.6 m), weight 189 lb 9.6 oz (86 kg), SpO2 100 %.    HEENT: No thrush or ulcers. Resp: Lungs clear bilaterally. Cardio: Regular rate and rhythm. GI: Abdomen soft and nontender.  No hepatosplenomegaly. Vascular: No leg edema. Skin: Palms and soles without erythema.  Healing incision left heel without erythema.   Lab Results:  Lab Results  Component Value Date   WBC 5.7 03/06/2022   HGB 11.6 (L) 03/06/2022   HCT 36.1 03/06/2022   MCV 84.9 03/06/2022   PLT 278.0 03/06/2022   NEUTROABS 3.1 03/06/2022    Imaging:  No results found.  Medications: I have reviewed the patient's current medications.  Assessment/Plan: Rectal cancer Colonoscopy 03/06/2022-nonobstructing mass at 12 cm from the anal verge-biopsy invasive moderately differentiated adenocarcinoma arising within a tubular adenoma with high-grade dysplasia CTs 03/13/2022-no rectal mass seen, no evidence of metastatic disease, small 2-4 mm pulmonary nodules-at least 2 are calcified, likely benign granulomas, nonobstructing stone in the interpolar right renal collecting system MRI pelvis 03/30/2022-tumor at 11-13 cm from the anal verge, T3b, early EMVI?,  No adenopathy Neoadjuvant radiation/capecitabine 04/21/2022   2.   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  Diabetes   4.  Laparoscopic gastric band surgery  Disposition: Ms. Tinch appears stable.  She began neoadjuvant radiation/capecitabine earlier today.  We again reviewed potential toxicities associated with capecitabine.  She agrees to proceed.  She will return for lab and follow-up in approximately 2 weeks.  We are available to see her sooner if needed.    Ned Card ANP/GNP-BC   04/21/2022  2:01 PM

## 2022-04-22 ENCOUNTER — Other Ambulatory Visit: Payer: Self-pay

## 2022-04-22 ENCOUNTER — Ambulatory Visit
Admission: RE | Admit: 2022-04-22 | Discharge: 2022-04-22 | Disposition: A | Discharge status: Home / Self Care | Payer: PPO | Source: Ambulatory Visit | Attending: Radiation Oncology | Admitting: Radiation Oncology

## 2022-04-22 DIAGNOSIS — C20 Malignant neoplasm of rectum: Secondary | ICD-10-CM | POA: Diagnosis not present

## 2022-04-22 DIAGNOSIS — Z510 Encounter for antineoplastic radiation therapy: Secondary | ICD-10-CM | POA: Diagnosis not present

## 2022-04-22 LAB — RAD ONC ARIA SESSION SUMMARY
Course Elapsed Days: 1
Plan Fractions Treated to Date: 2
Plan Prescribed Dose Per Fraction: 1.8 Gy
Plan Total Fractions Prescribed: 25
Plan Total Prescribed Dose: 45 Gy
Reference Point Dosage Given to Date: 3.6 Gy
Reference Point Session Dosage Given: 1.8 Gy
Session Number: 2

## 2022-04-23 ENCOUNTER — Other Ambulatory Visit: Payer: Self-pay

## 2022-04-23 ENCOUNTER — Ambulatory Visit
Admission: RE | Admit: 2022-04-23 | Discharge: 2022-04-23 | Disposition: A | Discharge status: Home / Self Care | Payer: PPO | Source: Ambulatory Visit | Attending: Radiation Oncology | Admitting: Radiation Oncology

## 2022-04-23 DIAGNOSIS — Z510 Encounter for antineoplastic radiation therapy: Secondary | ICD-10-CM | POA: Diagnosis not present

## 2022-04-23 DIAGNOSIS — C20 Malignant neoplasm of rectum: Secondary | ICD-10-CM | POA: Diagnosis not present

## 2022-04-23 LAB — RAD ONC ARIA SESSION SUMMARY
Course Elapsed Days: 2
Plan Fractions Treated to Date: 3
Plan Prescribed Dose Per Fraction: 1.8 Gy
Plan Total Fractions Prescribed: 25
Plan Total Prescribed Dose: 45 Gy
Reference Point Dosage Given to Date: 5.4 Gy
Reference Point Session Dosage Given: 1.8 Gy
Session Number: 3

## 2022-04-24 ENCOUNTER — Ambulatory Visit
Admission: RE | Admit: 2022-04-24 | Discharge: 2022-04-24 | Disposition: A | Discharge status: Home / Self Care | Payer: PPO | Source: Ambulatory Visit | Attending: Radiation Oncology | Admitting: Radiation Oncology

## 2022-04-24 ENCOUNTER — Other Ambulatory Visit: Payer: Self-pay

## 2022-04-24 ENCOUNTER — Other Ambulatory Visit (HOSPITAL_COMMUNITY): Payer: Self-pay

## 2022-04-24 DIAGNOSIS — C20 Malignant neoplasm of rectum: Secondary | ICD-10-CM | POA: Diagnosis not present

## 2022-04-24 DIAGNOSIS — Z510 Encounter for antineoplastic radiation therapy: Secondary | ICD-10-CM | POA: Diagnosis not present

## 2022-04-24 LAB — RAD ONC ARIA SESSION SUMMARY
Course Elapsed Days: 3
Plan Fractions Treated to Date: 4
Plan Prescribed Dose Per Fraction: 1.8 Gy
Plan Total Fractions Prescribed: 25
Plan Total Prescribed Dose: 45 Gy
Reference Point Dosage Given to Date: 7.2 Gy
Reference Point Session Dosage Given: 1.8 Gy
Session Number: 4

## 2022-04-25 ENCOUNTER — Other Ambulatory Visit: Payer: Self-pay

## 2022-04-25 ENCOUNTER — Ambulatory Visit
Admission: RE | Admit: 2022-04-25 | Discharge: 2022-04-25 | Disposition: A | Discharge status: Home / Self Care | Payer: PPO | Source: Ambulatory Visit | Attending: Radiation Oncology | Admitting: Radiation Oncology

## 2022-04-25 ENCOUNTER — Other Ambulatory Visit (HOSPITAL_COMMUNITY): Payer: Self-pay

## 2022-04-25 DIAGNOSIS — Z510 Encounter for antineoplastic radiation therapy: Secondary | ICD-10-CM | POA: Diagnosis not present

## 2022-04-25 DIAGNOSIS — C20 Malignant neoplasm of rectum: Secondary | ICD-10-CM

## 2022-04-25 LAB — RAD ONC ARIA SESSION SUMMARY
Course Elapsed Days: 4
Plan Fractions Treated to Date: 5
Plan Prescribed Dose Per Fraction: 1.8 Gy
Plan Total Fractions Prescribed: 25
Plan Total Prescribed Dose: 45 Gy
Reference Point Dosage Given to Date: 9 Gy
Reference Point Session Dosage Given: 1.8 Gy
Session Number: 5

## 2022-04-25 MED ORDER — SONAFINE EX EMUL
1.0000 | Freq: Once | CUTANEOUS | Status: AC
  Administered 2022-04-25: 1 via TOPICAL

## 2022-04-25 NOTE — Progress Notes (Signed)
Pt here for patient teaching.  Pt given Radiation and You booklet, skin care instructions, and Sonafine.  Reviewed areas of pertinence such as diarrhea, fatigue, hair loss, sexual and fertility changes, skin changes, and urinary and bladder changes . Pt able to give teach back of to pat skin, use unscented/gentle soap, use baby wipes, have Imodium on hand, drink plenty of water, and sitz bath,apply Sonafine bid and avoid applying anything to skin within 4 hours of treatment. Pt verbalizes understanding of information given and will contact nursing with any questions or concerns.    Gloriajean Dell. Leonie Green, BSN

## 2022-04-28 ENCOUNTER — Other Ambulatory Visit: Payer: Self-pay

## 2022-04-28 ENCOUNTER — Ambulatory Visit
Admission: RE | Admit: 2022-04-28 | Discharge: 2022-04-28 | Disposition: A | Discharge status: Home / Self Care | Payer: PPO | Source: Ambulatory Visit | Attending: Radiation Oncology | Admitting: Radiation Oncology

## 2022-04-28 DIAGNOSIS — C20 Malignant neoplasm of rectum: Secondary | ICD-10-CM | POA: Diagnosis not present

## 2022-04-28 DIAGNOSIS — Z510 Encounter for antineoplastic radiation therapy: Secondary | ICD-10-CM | POA: Diagnosis not present

## 2022-04-28 LAB — RAD ONC ARIA SESSION SUMMARY
Course Elapsed Days: 7
Plan Fractions Treated to Date: 6
Plan Prescribed Dose Per Fraction: 1.8 Gy
Plan Total Fractions Prescribed: 25
Plan Total Prescribed Dose: 45 Gy
Reference Point Dosage Given to Date: 10.8 Gy
Reference Point Session Dosage Given: 1.8 Gy
Session Number: 6

## 2022-04-29 ENCOUNTER — Other Ambulatory Visit: Payer: Self-pay

## 2022-04-29 ENCOUNTER — Ambulatory Visit
Admission: RE | Admit: 2022-04-29 | Discharge: 2022-04-29 | Disposition: A | Discharge status: Home / Self Care | Payer: PPO | Source: Ambulatory Visit | Attending: Radiation Oncology | Admitting: Radiation Oncology

## 2022-04-29 DIAGNOSIS — C20 Malignant neoplasm of rectum: Secondary | ICD-10-CM | POA: Diagnosis not present

## 2022-04-29 DIAGNOSIS — Z510 Encounter for antineoplastic radiation therapy: Secondary | ICD-10-CM | POA: Diagnosis not present

## 2022-04-29 LAB — RAD ONC ARIA SESSION SUMMARY
Course Elapsed Days: 8
Plan Fractions Treated to Date: 7
Plan Prescribed Dose Per Fraction: 1.8 Gy
Plan Total Fractions Prescribed: 25
Plan Total Prescribed Dose: 45 Gy
Reference Point Dosage Given to Date: 12.6 Gy
Reference Point Session Dosage Given: 1.8 Gy
Session Number: 7

## 2022-04-30 ENCOUNTER — Ambulatory Visit
Admission: RE | Admit: 2022-04-30 | Discharge: 2022-04-30 | Disposition: A | Discharge status: Home / Self Care | Payer: PPO | Source: Ambulatory Visit | Attending: Radiation Oncology | Admitting: Radiation Oncology

## 2022-04-30 ENCOUNTER — Other Ambulatory Visit: Payer: Self-pay

## 2022-04-30 DIAGNOSIS — C20 Malignant neoplasm of rectum: Secondary | ICD-10-CM | POA: Diagnosis not present

## 2022-04-30 DIAGNOSIS — Z510 Encounter for antineoplastic radiation therapy: Secondary | ICD-10-CM | POA: Diagnosis not present

## 2022-04-30 LAB — RAD ONC ARIA SESSION SUMMARY
Course Elapsed Days: 9
Plan Fractions Treated to Date: 8
Plan Prescribed Dose Per Fraction: 1.8 Gy
Plan Total Fractions Prescribed: 25
Plan Total Prescribed Dose: 45 Gy
Reference Point Dosage Given to Date: 14.4 Gy
Reference Point Session Dosage Given: 1.8 Gy
Session Number: 8

## 2022-05-01 ENCOUNTER — Other Ambulatory Visit: Payer: Self-pay

## 2022-05-01 ENCOUNTER — Ambulatory Visit
Admission: RE | Admit: 2022-05-01 | Discharge: 2022-05-01 | Disposition: A | Discharge status: Home / Self Care | Payer: PPO | Source: Ambulatory Visit | Attending: Radiation Oncology | Admitting: Radiation Oncology

## 2022-05-01 DIAGNOSIS — C20 Malignant neoplasm of rectum: Secondary | ICD-10-CM | POA: Diagnosis not present

## 2022-05-01 DIAGNOSIS — Z510 Encounter for antineoplastic radiation therapy: Secondary | ICD-10-CM | POA: Diagnosis not present

## 2022-05-01 LAB — RAD ONC ARIA SESSION SUMMARY
Course Elapsed Days: 10
Plan Fractions Treated to Date: 9
Plan Prescribed Dose Per Fraction: 1.8 Gy
Plan Total Fractions Prescribed: 25
Plan Total Prescribed Dose: 45 Gy
Reference Point Dosage Given to Date: 16.2 Gy
Reference Point Session Dosage Given: 1.8 Gy
Session Number: 9

## 2022-05-02 ENCOUNTER — Other Ambulatory Visit: Payer: Self-pay

## 2022-05-02 ENCOUNTER — Ambulatory Visit
Admission: RE | Admit: 2022-05-02 | Discharge: 2022-05-02 | Disposition: A | Discharge status: Home / Self Care | Payer: PPO | Source: Ambulatory Visit | Attending: Radiation Oncology | Admitting: Radiation Oncology

## 2022-05-02 DIAGNOSIS — C20 Malignant neoplasm of rectum: Secondary | ICD-10-CM | POA: Diagnosis not present

## 2022-05-02 DIAGNOSIS — Z510 Encounter for antineoplastic radiation therapy: Secondary | ICD-10-CM | POA: Diagnosis not present

## 2022-05-02 LAB — RAD ONC ARIA SESSION SUMMARY
Course Elapsed Days: 11
Plan Fractions Treated to Date: 10
Plan Prescribed Dose Per Fraction: 1.8 Gy
Plan Total Fractions Prescribed: 25
Plan Total Prescribed Dose: 45 Gy
Reference Point Dosage Given to Date: 18 Gy
Reference Point Session Dosage Given: 1.8 Gy
Session Number: 10

## 2022-05-05 ENCOUNTER — Ambulatory Visit
Admission: RE | Admit: 2022-05-05 | Discharge: 2022-05-05 | Disposition: A | Discharge status: Home / Self Care | Payer: PPO | Source: Ambulatory Visit | Attending: Radiation Oncology | Admitting: Radiation Oncology

## 2022-05-05 ENCOUNTER — Other Ambulatory Visit: Payer: Self-pay

## 2022-05-05 DIAGNOSIS — Z510 Encounter for antineoplastic radiation therapy: Secondary | ICD-10-CM | POA: Diagnosis not present

## 2022-05-05 DIAGNOSIS — C20 Malignant neoplasm of rectum: Secondary | ICD-10-CM | POA: Diagnosis not present

## 2022-05-05 LAB — RAD ONC ARIA SESSION SUMMARY
Course Elapsed Days: 14
Plan Fractions Treated to Date: 11
Plan Prescribed Dose Per Fraction: 1.8 Gy
Plan Total Fractions Prescribed: 25
Plan Total Prescribed Dose: 45 Gy
Reference Point Dosage Given to Date: 19.8 Gy
Reference Point Session Dosage Given: 1.8 Gy
Session Number: 11

## 2022-05-06 ENCOUNTER — Ambulatory Visit
Admission: RE | Admit: 2022-05-06 | Discharge: 2022-05-06 | Disposition: A | Discharge status: Home / Self Care | Payer: PPO | Source: Ambulatory Visit | Attending: Radiation Oncology | Admitting: Radiation Oncology

## 2022-05-06 ENCOUNTER — Other Ambulatory Visit: Payer: Self-pay

## 2022-05-06 ENCOUNTER — Other Ambulatory Visit (HOSPITAL_COMMUNITY): Payer: Self-pay

## 2022-05-06 DIAGNOSIS — Z510 Encounter for antineoplastic radiation therapy: Secondary | ICD-10-CM | POA: Diagnosis not present

## 2022-05-06 DIAGNOSIS — C20 Malignant neoplasm of rectum: Secondary | ICD-10-CM | POA: Diagnosis not present

## 2022-05-06 LAB — RAD ONC ARIA SESSION SUMMARY
Course Elapsed Days: 15
Plan Fractions Treated to Date: 12
Plan Prescribed Dose Per Fraction: 1.8 Gy
Plan Total Fractions Prescribed: 25
Plan Total Prescribed Dose: 45 Gy
Reference Point Dosage Given to Date: 21.6 Gy
Reference Point Session Dosage Given: 1.8 Gy
Session Number: 12

## 2022-05-07 ENCOUNTER — Telehealth: Payer: Self-pay

## 2022-05-07 ENCOUNTER — Inpatient Hospital Stay: Payer: PPO

## 2022-05-07 ENCOUNTER — Other Ambulatory Visit: Payer: Self-pay

## 2022-05-07 ENCOUNTER — Encounter: Payer: Self-pay | Admitting: Nurse Practitioner

## 2022-05-07 ENCOUNTER — Ambulatory Visit
Admission: RE | Admit: 2022-05-07 | Discharge: 2022-05-07 | Disposition: A | Discharge status: Home / Self Care | Payer: PPO | Source: Ambulatory Visit | Attending: Radiation Oncology | Admitting: Radiation Oncology

## 2022-05-07 ENCOUNTER — Inpatient Hospital Stay: Payer: PPO | Admitting: Nurse Practitioner

## 2022-05-07 VITALS — BP 141/79 | HR 81 | Temp 98.2°F | Resp 18 | Ht 63.0 in | Wt 189.2 lb

## 2022-05-07 DIAGNOSIS — C20 Malignant neoplasm of rectum: Secondary | ICD-10-CM

## 2022-05-07 DIAGNOSIS — Z510 Encounter for antineoplastic radiation therapy: Secondary | ICD-10-CM | POA: Diagnosis not present

## 2022-05-07 LAB — RAD ONC ARIA SESSION SUMMARY
Course Elapsed Days: 16
Plan Fractions Treated to Date: 13
Plan Prescribed Dose Per Fraction: 1.8 Gy
Plan Total Fractions Prescribed: 25
Plan Total Prescribed Dose: 45 Gy
Reference Point Dosage Given to Date: 23.4 Gy
Reference Point Session Dosage Given: 1.8 Gy
Session Number: 13

## 2022-05-07 LAB — CMP (CANCER CENTER ONLY)
ALT: 10 U/L (ref 0–44)
AST: 13 U/L — ABNORMAL LOW (ref 15–41)
Albumin: 4.5 g/dL (ref 3.5–5.0)
Alkaline Phosphatase: 46 U/L (ref 38–126)
Anion gap: 12 (ref 5–15)
BUN: 16 mg/dL (ref 8–23)
CO2: 27 mmol/L (ref 22–32)
Calcium: 9.2 mg/dL (ref 8.9–10.3)
Chloride: 103 mmol/L (ref 98–111)
Creatinine: 0.89 mg/dL (ref 0.44–1.00)
GFR, Estimated: >60 mL/min (ref >60)
Glucose, Bld: 86 mg/dL (ref 70–99)
Potassium: 4.4 mmol/L (ref 3.5–5.1)
Sodium: 142 mmol/L (ref 135–145)
Total Bilirubin: 0.7 mg/dL (ref 0.3–1.2)
Total Protein: 6.7 g/dL (ref 6.5–8.1)

## 2022-05-07 LAB — CBC WITH DIFFERENTIAL (CANCER CENTER ONLY)
Abs Immature Granulocytes: 0.01 10*3/uL (ref 0.00–0.07)
Basophils Absolute: 0 10*3/uL (ref 0.0–0.1)
Basophils Relative: 0 %
Eosinophils Absolute: 0.2 10*3/uL (ref 0.0–0.5)
Eosinophils Relative: 5 %
HCT: 36.9 % (ref 36.0–46.0)
Hemoglobin: 11.7 g/dL — ABNORMAL LOW (ref 12.0–15.0)
Immature Granulocytes: 0 %
Lymphocytes Relative: 26 %
Lymphs Abs: 0.9 10*3/uL (ref 0.7–4.0)
MCH: 27.9 pg (ref 26.0–34.0)
MCHC: 31.7 g/dL (ref 30.0–36.0)
MCV: 87.9 fL (ref 80.0–100.0)
Monocytes Absolute: 0.3 10*3/uL (ref 0.1–1.0)
Monocytes Relative: 8 %
Neutro Abs: 2.3 10*3/uL (ref 1.7–7.7)
Neutrophils Relative %: 61 %
Platelet Count: 188 10*3/uL (ref 150–400)
RBC: 4.2 MIL/uL (ref 3.87–5.11)
RDW: 16.7 % — ABNORMAL HIGH (ref 11.5–15.5)
WBC Count: 3.7 10*3/uL — ABNORMAL LOW (ref 4.0–10.5)
nRBC: 0 % (ref 0.0–0.2)

## 2022-05-07 NOTE — Progress Notes (Unsigned)
  [facility] OFFICE PROGRESS NOTE   Diagnosis: Rectal cancer  INTERVAL HISTORY:   Destiny [name] returns as scheduled.  She continues radiation and Xeloda.  She denies nausea/vomiting.  No mouth sores.  No diarrhea.  No hand or foot pain or redness.  She notes the skin is peeling off of the left heel at the site of the previous melanoma surgery.  Objective:  Vital signs in last 24 hours:  Blood pressure (!) 141/79, pulse 81, temperature 98.2 F (36.8 C), temperature source Oral, resp. rate 18, height '5\' 3"'$  (1.6 m), weight 189 lb 3.2 oz (85.8 kg), SpO2 97 %.    HEENT: No thrush or ulcers. Resp: Lungs clear bilaterally. Cardio: Regular rate and rhythm. GI: No hepatosplenomegaly. Vascular: No leg edema. Skin: Palms and soles without erythema.  Left heel with superficial skin breakdown.   Lab Results:  Lab Results  Component Value Date   WBC 3.7 (L) 05/07/2022   HGB 11.7 (L) 05/07/2022   HCT 36.9 05/07/2022   MCV 87.9 05/07/2022   PLT 188 05/07/2022   NEUTROABS 2.3 05/07/2022    Imaging:  No results found.  Medications: I have reviewed the patient's current medications.  Assessment/Plan: Rectal cancer Colonoscopy 03/06/2022-nonobstructing mass at 12 cm from the anal verge-biopsy invasive moderately differentiated adenocarcinoma arising within a tubular adenoma with high-grade dysplasia CTs 03/13/2022-no rectal mass seen, no evidence of metastatic disease, small 2-4 mm pulmonary nodules-at least 2 are calcified, likely benign granulomas, nonobstructing stone in the interpolar right renal collecting system MRI pelvis 03/30/2022-tumor at 11-13 cm from the anal verge, T3b, early EMVI?,  No adenopathy Neoadjuvant radiation/capecitabine 04/21/2022   2.   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  Diabetes   4.  Laparoscopic gastric band surgery    Disposition: Destiny [name] appears stable.  She continues radiation/Xeloda, overall seems to be tolerating well.  She has new skin breakdown on the left heel at the previous site of melanoma surgery.  Unclear if this is related to Xeloda.  She will continue Xeloda for now.  We made a referral to Dr. Marla Roe, plastic surgery.  We will see [name] in follow-up in 1 week.  Patient seen with Dr. Benay Spice.  Ned Card ANP/GNP-BC   05/07/2022  2:47 PM This was a shared visit with Ned Card.  Destiny [name] was interviewed and examined.  She appears to be tolerating the radiation a little well.  She has superficial skin breakdown at the site of previous left heel surgery.  She has no other skin changes suggestive of Xeloda toxicity.  The plan is to continue Xeloda for now.  She will return for a repeat examination next week.  We referred her to plastic surgery.  I was present for greater than 50% of today's visit.  I performed medical decision making.  Julieanne Manson, MD

## 2022-05-07 NOTE — Telephone Encounter (Signed)
I placed a referral to [employer]. [place] at [facility].

## 2022-05-08 ENCOUNTER — Ambulatory Visit
Admission: RE | Admit: 2022-05-08 | Discharge: 2022-05-08 | Disposition: A | Discharge status: Home / Self Care | Payer: PPO | Source: Ambulatory Visit | Attending: Radiation Oncology | Admitting: Radiation Oncology

## 2022-05-08 ENCOUNTER — Other Ambulatory Visit: Payer: Self-pay

## 2022-05-08 DIAGNOSIS — C20 Malignant neoplasm of rectum: Secondary | ICD-10-CM | POA: Diagnosis not present

## 2022-05-08 DIAGNOSIS — Z510 Encounter for antineoplastic radiation therapy: Secondary | ICD-10-CM | POA: Diagnosis not present

## 2022-05-08 LAB — RAD ONC ARIA SESSION SUMMARY
Course Elapsed Days: 17
Plan Fractions Treated to Date: 14
Plan Prescribed Dose Per Fraction: 1.8 Gy
Plan Total Fractions Prescribed: 25
Plan Total Prescribed Dose: 45 Gy
Reference Point Dosage Given to Date: 25.2 Gy
Reference Point Session Dosage Given: 1.8 Gy
Session Number: 14

## 2022-05-09 ENCOUNTER — Ambulatory Visit: Admission: RE | Admit: 2022-05-09 | Payer: PPO | Source: Ambulatory Visit

## 2022-05-09 ENCOUNTER — Other Ambulatory Visit: Payer: Self-pay

## 2022-05-09 ENCOUNTER — Ambulatory Visit
Admission: RE | Admit: 2022-05-09 | Discharge: 2022-05-09 | Disposition: A | Discharge status: Home / Self Care | Payer: PPO | Source: Ambulatory Visit | Attending: Radiation Oncology | Admitting: Radiation Oncology

## 2022-05-09 DIAGNOSIS — Z510 Encounter for antineoplastic radiation therapy: Secondary | ICD-10-CM | POA: Diagnosis not present

## 2022-05-09 DIAGNOSIS — H903 Sensorineural hearing loss, bilateral: Secondary | ICD-10-CM | POA: Diagnosis not present

## 2022-05-09 DIAGNOSIS — H9313 Tinnitus, bilateral: Secondary | ICD-10-CM | POA: Diagnosis not present

## 2022-05-09 DIAGNOSIS — C20 Malignant neoplasm of rectum: Secondary | ICD-10-CM | POA: Diagnosis not present

## 2022-05-09 LAB — RAD ONC ARIA SESSION SUMMARY
Course Elapsed Days: 18
Plan Fractions Treated to Date: 15
Plan Prescribed Dose Per Fraction: 1.8 Gy
Plan Total Fractions Prescribed: 25
Plan Total Prescribed Dose: 45 Gy
Reference Point Dosage Given to Date: 27 Gy
Reference Point Session Dosage Given: 1.8 Gy
Session Number: 15

## 2022-05-12 ENCOUNTER — Other Ambulatory Visit: Payer: Self-pay

## 2022-05-12 ENCOUNTER — Ambulatory Visit
Admission: RE | Admit: 2022-05-12 | Discharge: 2022-05-12 | Disposition: A | Discharge status: Home / Self Care | Payer: PPO | Source: Ambulatory Visit | Attending: Radiation Oncology | Admitting: Radiation Oncology

## 2022-05-12 DIAGNOSIS — Z510 Encounter for antineoplastic radiation therapy: Secondary | ICD-10-CM | POA: Diagnosis not present

## 2022-05-12 DIAGNOSIS — C20 Malignant neoplasm of rectum: Secondary | ICD-10-CM | POA: Diagnosis not present

## 2022-05-12 LAB — RAD ONC ARIA SESSION SUMMARY
Course Elapsed Days: 21
Plan Fractions Treated to Date: 16
Plan Prescribed Dose Per Fraction: 1.8 Gy
Plan Total Fractions Prescribed: 25
Plan Total Prescribed Dose: 45 Gy
Reference Point Dosage Given to Date: 28.8 Gy
Reference Point Session Dosage Given: 1.8 Gy
Session Number: 16

## 2022-05-13 ENCOUNTER — Ambulatory Visit: Payer: PPO | Admitting: Physician Assistant

## 2022-05-13 ENCOUNTER — Ambulatory Visit
Admission: RE | Admit: 2022-05-13 | Discharge: 2022-05-13 | Disposition: A | Discharge status: Home / Self Care | Payer: PPO | Source: Ambulatory Visit | Attending: Radiation Oncology | Admitting: Radiation Oncology

## 2022-05-13 ENCOUNTER — Other Ambulatory Visit: Payer: Self-pay

## 2022-05-13 DIAGNOSIS — Z85820 Personal history of malignant melanoma of skin: Secondary | ICD-10-CM

## 2022-05-13 DIAGNOSIS — S91302A Unspecified open wound, left foot, initial encounter: Secondary | ICD-10-CM | POA: Diagnosis not present

## 2022-05-13 DIAGNOSIS — Z510 Encounter for antineoplastic radiation therapy: Secondary | ICD-10-CM | POA: Diagnosis not present

## 2022-05-13 DIAGNOSIS — C20 Malignant neoplasm of rectum: Secondary | ICD-10-CM | POA: Insufficient documentation

## 2022-05-13 LAB — RAD ONC ARIA SESSION SUMMARY
Course Elapsed Days: 22
Plan Fractions Treated to Date: 17
Plan Prescribed Dose Per Fraction: 1.8 Gy
Plan Total Fractions Prescribed: 25
Plan Total Prescribed Dose: 45 Gy
Reference Point Dosage Given to Date: 30.6 Gy
Reference Point Session Dosage Given: 1.8 Gy
Session Number: 17

## 2022-05-13 NOTE — Progress Notes (Signed)
   Referring Provider Deland Pretty, MD [address] [place],  [place] [address]   CC: No chief complaint on file.     Destiny [name] is an 76 y.o. female.  HPI:   Destiny [name] is a 76 year old female with past medical history of reexcision of left heel melanoma performed on 05/22/2021.  She was followed closely in our office with good progress of the wound on the left heel.  At her last office visit on 10/04/2021 she did have a blister that was incised and drained of old blood.  She did not need any follow-up evaluation thereafter.  Since her last office visit she has been diagnosed with rectal cancer with biopsies from colonoscopy in 03/04/2022 noting invasive moderately differentiated adenocarcinoma arising within a tubular adenoma with high-grade dysplasia.  She has been on neoadjuvant radiation/xeloda.   She reported to her oncologist that she has had some tissue breakdown along the left heel at the site of her previous melanoma excision.  She notes she first noticed this approximately 1 week ago, she has been applying triple antibiotic ointment and gauze dressing.  Denies any other wounds or issues, she denies any infectious symptoms including fever.  She reports that her blood sugars very well controlled.  She continues to take her chemotherapy.   Review of Systems General: negative for fevers  Physical Exam    05/07/2022    2:45 PM 04/21/2022    1:55 PM 04/09/2022    2:44 PM  Vitals with BMI  Height '5\' 3"'$  '5\' 3"'$  '5\' 3"'$   Weight 189 lbs 3 oz 189 lbs 10 oz 190 lbs  BMI 33.52 93.57 01.77  Systolic 939 030   Diastolic 79 79   Pulse 81 80     General:  No acute distress,  Alert and oriented, Non-Toxic, Normal speech and affect 3x3 cm wound to left heal with 1 cm of surrounding macerated tissue, partial thickness, no purulent discharge or surrounding redness    Assessment/Plan   Destiny [name] is a pleasant 56 YOF seen today for evaluation of wound to her left heel.   She does have a previous history of excision for melanoma that had previously healed.  Today the wound is very moist, I would recommend nonadherent dressing, and encouraged the patient to avoid the triple antibiotic ointment.  We would recommend the patient follow-up as an outpatient at the wound care center, no indication for surgical intervention at this time.  We are happy to see the patient back in our office on an as-needed basis.  Destiny [name] Destiny [name] 05/13/2022, 8:34 AM

## 2022-05-14 ENCOUNTER — Ambulatory Visit
Admission: RE | Admit: 2022-05-14 | Discharge: 2022-05-14 | Disposition: A | Discharge status: Home / Self Care | Payer: PPO | Source: Ambulatory Visit | Attending: Radiation Oncology | Admitting: Radiation Oncology

## 2022-05-14 ENCOUNTER — Other Ambulatory Visit: Payer: Self-pay

## 2022-05-14 DIAGNOSIS — C20 Malignant neoplasm of rectum: Secondary | ICD-10-CM | POA: Diagnosis not present

## 2022-05-14 DIAGNOSIS — Z510 Encounter for antineoplastic radiation therapy: Secondary | ICD-10-CM | POA: Diagnosis not present

## 2022-05-14 LAB — RAD ONC ARIA SESSION SUMMARY
Course Elapsed Days: 23
Plan Fractions Treated to Date: 18
Plan Prescribed Dose Per Fraction: 1.8 Gy
Plan Total Fractions Prescribed: 25
Plan Total Prescribed Dose: 45 Gy
Reference Point Dosage Given to Date: 32.4 Gy
Reference Point Session Dosage Given: 1.8 Gy
Session Number: 18

## 2022-05-15 ENCOUNTER — Other Ambulatory Visit: Payer: Self-pay

## 2022-05-15 ENCOUNTER — Encounter: Payer: Self-pay | Admitting: Nurse Practitioner

## 2022-05-15 ENCOUNTER — Inpatient Hospital Stay: Payer: PPO | Attending: Nurse Practitioner | Admitting: Nurse Practitioner

## 2022-05-15 ENCOUNTER — Ambulatory Visit
Admission: RE | Admit: 2022-05-15 | Discharge: 2022-05-15 | Disposition: A | Discharge status: Home / Self Care | Payer: PPO | Source: Ambulatory Visit | Attending: Radiation Oncology | Admitting: Radiation Oncology

## 2022-05-15 VITALS — BP 124/77 | HR 78 | Temp 98.1°F | Resp 20 | Ht 63.0 in | Wt 187.8 lb

## 2022-05-15 DIAGNOSIS — C20 Malignant neoplasm of rectum: Secondary | ICD-10-CM | POA: Diagnosis not present

## 2022-05-15 DIAGNOSIS — R197 Diarrhea, unspecified: Secondary | ICD-10-CM | POA: Insufficient documentation

## 2022-05-15 DIAGNOSIS — E119 Type 2 diabetes mellitus without complications: Secondary | ICD-10-CM | POA: Insufficient documentation

## 2022-05-15 DIAGNOSIS — Z510 Encounter for antineoplastic radiation therapy: Secondary | ICD-10-CM | POA: Diagnosis not present

## 2022-05-15 DIAGNOSIS — Z923 Personal history of irradiation: Secondary | ICD-10-CM | POA: Insufficient documentation

## 2022-05-15 LAB — RAD ONC ARIA SESSION SUMMARY
Course Elapsed Days: 24
Plan Fractions Treated to Date: 19
Plan Prescribed Dose Per Fraction: 1.8 Gy
Plan Total Fractions Prescribed: 25
Plan Total Prescribed Dose: 45 Gy
Reference Point Dosage Given to Date: 34.2 Gy
Reference Point Session Dosage Given: 1.8 Gy
Session Number: 19

## 2022-05-15 NOTE — Progress Notes (Signed)
  [facility] OFFICE PROGRESS NOTE   Diagnosis: Rectal cancer  INTERVAL HISTORY:   Destiny [name] returns as scheduled.  She continues radiation and Xeloda.  At office visit 05/07/2022 she was noted to have progressive skin breakdown at the previous left heel surgery site.  She saw plastic surgery 05/13/2022 with recommendation for a nonadherent dressing, avoidance of triple antibiotic ointment, follow-up at the wound center.  She reports the skin breakdown at the heel has progressed and is involving the larger area.  No other issues with the hands or feet.  No nausea or vomiting.  Intermittent diarrhea.  Objective:  Vital signs in last 24 hours:  Blood pressure 124/77, pulse 78, temperature 98.1 F (36.7 C), temperature source Oral, resp. rate 20, height '5\' 3"'$  (1.6 m), weight 187 lb 12.8 oz (85.2 kg), SpO2 96 %.    HEENT: No thrush or ulcers. Resp: Lungs clear bilaterally. Cardio: Regular rate and rhythm. GI: Abdomen soft and nontender.  No hepatomegaly. Vascular: No leg edema. Skin: Soles with mild changes of hand-foot syndrome.  Progressive ulceration at the left heel, appears deeper and larger.   Lab Results:  Lab Results  Component Value Date   WBC 3.7 (L) 05/07/2022   HGB 11.7 (L) 05/07/2022   HCT 36.9 05/07/2022   MCV 87.9 05/07/2022   PLT 188 05/07/2022   NEUTROABS 2.3 05/07/2022    Imaging:  No results found.  Medications: I have reviewed the patient's current medications.  Assessment/Plan: Rectal cancer Colonoscopy 03/06/2022-nonobstructing mass at 12 cm from the anal verge-biopsy invasive moderately differentiated adenocarcinoma arising within a tubular adenoma with high-grade dysplasia CTs 03/13/2022-no rectal mass seen, no evidence of metastatic disease, small 2-4 mm pulmonary nodules-at least 2 are calcified, likely benign granulomas, nonobstructing stone in the interpolar right renal collecting system MRI pelvis 03/30/2022-tumor at 11-13 cm from  the anal verge, T3b, early EMVI?,  No adenopathy Neoadjuvant radiation/capecitabine 04/21/2022 Xeloda discontinued 05/15/2022 due to progressive breakdown/ulceration at the left heel.   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  Diabetes   4.  Laparoscopic gastric band surgery  5.  Left heel ulceration-progressive 05/15/2022.  Xeloda discontinued  Disposition: Destiny [name] is completing neoadjuvant radiation/Xeloda for rectal cancer.  She has progressive ulceration at the left heel.  She understands this is likely related to Xeloda.  Dr. Benay Spice recommends discontinuation of Xeloda, continue radiation.  She will follow-up at the wound clinic.  She will return for follow-up here in approximately 3 weeks.  Patient seen with Dr. Benay Spice.    Ned Card ANP/GNP-BC   05/15/2022  11:43 AM This was a shared visit with Ned Card.  Destiny [name] was interviewed and examined.  The heel ulcer has worsened over the past week.  She has mild changes of hand/foot syndrome at the soles.  We decided to discontinue Xeloda.  She is scheduled to be evaluated in the wound clinic.  I was present for greater than 50% of today's visit.  I performed medical decision making.  Julieanne Manson, MD

## 2022-05-16 ENCOUNTER — Other Ambulatory Visit: Payer: Self-pay

## 2022-05-16 ENCOUNTER — Ambulatory Visit
Admission: RE | Admit: 2022-05-16 | Discharge: 2022-05-16 | Disposition: A | Discharge status: Home / Self Care | Payer: PPO | Source: Ambulatory Visit | Attending: Radiation Oncology | Admitting: Radiation Oncology

## 2022-05-16 DIAGNOSIS — C20 Malignant neoplasm of rectum: Secondary | ICD-10-CM | POA: Diagnosis not present

## 2022-05-16 DIAGNOSIS — Z510 Encounter for antineoplastic radiation therapy: Secondary | ICD-10-CM | POA: Diagnosis not present

## 2022-05-16 LAB — RAD ONC ARIA SESSION SUMMARY
Course Elapsed Days: 25
Plan Fractions Treated to Date: 20
Plan Prescribed Dose Per Fraction: 1.8 Gy
Plan Total Fractions Prescribed: 25
Plan Total Prescribed Dose: 45 Gy
Reference Point Dosage Given to Date: 36 Gy
Reference Point Session Dosage Given: 1.8 Gy
Session Number: 20

## 2022-05-19 ENCOUNTER — Encounter (HOSPITAL_BASED_OUTPATIENT_CLINIC_OR_DEPARTMENT_OTHER): Payer: PPO | Attending: General Surgery | Admitting: General Surgery

## 2022-05-19 ENCOUNTER — Other Ambulatory Visit: Payer: Self-pay

## 2022-05-19 ENCOUNTER — Ambulatory Visit
Admission: RE | Admit: 2022-05-19 | Discharge: 2022-05-19 | Disposition: A | Discharge status: Home / Self Care | Payer: PPO | Source: Ambulatory Visit | Attending: Radiation Oncology | Admitting: Radiation Oncology

## 2022-05-19 DIAGNOSIS — M1990 Unspecified osteoarthritis, unspecified site: Secondary | ICD-10-CM | POA: Diagnosis not present

## 2022-05-19 DIAGNOSIS — Z923 Personal history of irradiation: Secondary | ICD-10-CM | POA: Insufficient documentation

## 2022-05-19 DIAGNOSIS — E1151 Type 2 diabetes mellitus with diabetic peripheral angiopathy without gangrene: Secondary | ICD-10-CM | POA: Diagnosis not present

## 2022-05-19 DIAGNOSIS — I10 Essential (primary) hypertension: Secondary | ICD-10-CM | POA: Diagnosis not present

## 2022-05-19 DIAGNOSIS — Z85820 Personal history of malignant melanoma of skin: Secondary | ICD-10-CM | POA: Diagnosis not present

## 2022-05-19 DIAGNOSIS — T8131XA Disruption of external operation (surgical) wound, not elsewhere classified, initial encounter: Secondary | ICD-10-CM | POA: Diagnosis not present

## 2022-05-19 DIAGNOSIS — E11621 Type 2 diabetes mellitus with foot ulcer: Secondary | ICD-10-CM | POA: Diagnosis not present

## 2022-05-19 DIAGNOSIS — L97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: Secondary | ICD-10-CM | POA: Insufficient documentation

## 2022-05-19 DIAGNOSIS — C20 Malignant neoplasm of rectum: Secondary | ICD-10-CM | POA: Diagnosis not present

## 2022-05-19 DIAGNOSIS — L97412 Non-pressure chronic ulcer of right heel and midfoot with fat layer exposed: Secondary | ICD-10-CM | POA: Diagnosis not present

## 2022-05-19 DIAGNOSIS — Z9221 Personal history of antineoplastic chemotherapy: Secondary | ICD-10-CM | POA: Diagnosis not present

## 2022-05-19 DIAGNOSIS — Z510 Encounter for antineoplastic radiation therapy: Secondary | ICD-10-CM | POA: Diagnosis not present

## 2022-05-19 DIAGNOSIS — C439 Malignant melanoma of skin, unspecified: Secondary | ICD-10-CM | POA: Diagnosis not present

## 2022-05-19 LAB — RAD ONC ARIA SESSION SUMMARY
Course Elapsed Days: 28
Plan Fractions Treated to Date: 21
Plan Prescribed Dose Per Fraction: 1.8 Gy
Plan Total Fractions Prescribed: 25
Plan Total Prescribed Dose: 45 Gy
Reference Point Dosage Given to Date: 37.8 Gy
Reference Point Session Dosage Given: 1.8 Gy
Session Number: 21

## 2022-05-19 NOTE — Progress Notes (Signed)
CAIDYNCE, MUZYKA ([id number]) Visit Report for 05/19/2022 Allergy List Details Patient Name: Date of Service: Destiny [name], Destiny [name] 05/19/2022 8:00 A M Medical Record Number: [id number] Patient Account Number: [id number] Date of Birth/Sex: Treating RN: 19-Jun-1946 (76 y.o. Female) Dellie Catholic Primary Care Jaceyon Strole: Deland Pretty Other Clinician: Referring Tyon Cerasoli: Treating Hipolito Martinezlopez/Extender: Orest Dikes in Treatment: 0 Allergies Active Allergies propofol Severity: Severe aspirin Severity: Severe atorvastatin Severity: Severe Lisinopril Severity: Severe Type: Medication hydrocodone Severity: Severe penicillin Severity: Severe Allergy Notes Electronic Signature(s) Signed: 05/19/2022 5:39:10 PM By: Dellie Catholic RN Entered By: Dellie Catholic on 05/19/2022 08:38:06 -------------------------------------------------------------------------------- Arrival Information Details Patient Name: Date of Service: Destiny Dark F. 05/19/2022 8:00 A M Medical Record Number: [id number] Patient Account Number: [id number] Date of Birth/Sex: Treating RN: 07/20/1946 (76 y.o. Female) Dellie Catholic Primary Care Joseh Sjogren: Deland Pretty Other Clinician: Referring Lenise Jr: Treating Kellyann Ordway/Extender: Orest Dikes in Treatment: 0 Visit Information Patient Arrived: Ambulatory Arrival Time: 08:30 Accompanied By: self Transfer Assistance: None Patient Identification Verified: Yes Electronic Signature(s) Signed: 05/19/2022 5:39:10 PM By: Dellie Catholic RN Entered By: Dellie Catholic on 05/19/2022 08:34:21 -------------------------------------------------------------------------------- Clinic Level of Care Assessment Details Patient Name: Date of Service: Destiny [name], Destiny [name] 05/19/2022 8:00 A M Medical Record Number: [id number] Patient Account Number: [id number] Date of Birth/Sex: Treating RN: 09-18-1946 (76 y.o. Female) Dellie Catholic Primary Care Rosalie Gelpi: Deland Pretty Other Clinician: Referring Daziya Redmond: Treating Amijah Timothy/Extender: Orest Dikes in Treatment: 0 Clinic Level of Care Assessment Items TOOL 1 Quantity Score X- 1 0 Use when EandM and Procedure is performed on INITIAL visit ASSESSMENTS - Nursing Assessment / Reassessment X- 1 20 General Physical Exam (combine w/ comprehensive assessment (listed just below) when performed on new pt. evals) X- 1 25 Comprehensive Assessment (HX, ROS, Risk Assessments, Wounds Hx, etc.) ASSESSMENTS - Wound and Skin Assessment / Reassessment X- 1 10 Dermatologic / Skin Assessment (not related to wound area) ASSESSMENTS - Ostomy and/or Continence Assessment and Care '[]'$  - 0 Incontinence Assessment and Management '[]'$  - 0 Ostomy Care Assessment and Management (repouching, etc.) PROCESS - Coordination of Care X - Simple Patient / Family Education for ongoing care 1 15 '[]'$  - 0 Complex (extensive) Patient / Family Education for ongoing care X- 1 10 Staff obtains Programmer, systems, Records, T Results / Process Orders est X- 1 10 Staff telephones HHA, Nursing Homes / Clarify orders / etc '[]'$  - 0 Routine Transfer to another Facility (non-emergent condition) '[]'$  - 0 Routine Hospital Admission (non-emergent condition) X- 1 15 New Admissions / Biomedical engineer / Ordering NPWT Apligraf, etc. , '[]'$  - 0 Emergency Hospital Admission (emergent condition) PROCESS - Special Needs '[]'$  - 0 Pediatric / Minor Patient Management '[]'$  - 0 Isolation Patient Management '[]'$  - 0 Hearing / Language / Visual special needs '[]'$  - 0 Assessment of Community assistance (transportation, D/C planning, etc.) '[]'$  - 0 Additional assistance / Altered mentation '[]'$  - 0 Support Surface(s) Assessment (bed, cushion, seat, etc.) INTERVENTIONS - Miscellaneous '[]'$  - 0 External ear exam '[]'$  - 0 Patient Transfer (multiple staff / Civil Service fast streamer / Similar devices) '[]'$  - 0 Simple Staple  / Suture removal (25 or less) '[]'$  - 0 Complex Staple / Suture removal (26 or more) '[]'$  - 0 Hypo/Hyperglycemic Management (do not check if billed separately) '[]'$  - 0 Ankle / Brachial Index (ABI) - do not check if billed separately Has the patient been seen at the hospital within the last three years: Yes Total Score: 105 Level Of  Care: New/Established - Level 3 Electronic Signature(s) Signed: 05/19/2022 5:39:10 PM By: Dellie Catholic RN Entered By: Dellie Catholic on 05/19/2022 17:37:50 -------------------------------------------------------------------------------- Encounter Discharge Information Details Patient Name: Date of Service: Destiny Dark F. 05/19/2022 8:00 A M Medical Record Number: [id number] Patient Account Number: [id number] Date of Birth/Sex: Treating RN: 12-23-1945 (76 y.o. Female) Dellie Catholic Primary Care Jimmy Stipes: Deland Pretty Other Clinician: Referring Jaxden Blyden: Treating Edan Juday/Extender: Orest Dikes in Treatment: 0 Encounter Discharge Information Items Post Procedure Vitals Discharge Condition: Stable Temperature (F): 98.2 Ambulatory Status: Ambulatory Pulse (bpm): 72 Discharge Destination: Home Respiratory Rate (breaths/min): 16 Transportation: Private Auto Blood Pressure (mmHg): 109/75 Accompanied By: self Schedule Follow-up Appointment: Yes Clinical Summary of Care: Patient Declined Electronic Signature(s) Signed: 05/19/2022 5:39:10 PM By: Dellie Catholic RN Entered By: Dellie Catholic on 05/19/2022 17:38:45 -------------------------------------------------------------------------------- Lower Extremity Assessment Details Patient Name: Date of Service: Destiny Dark F. 05/19/2022 8:00 A M Medical Record Number: [id number] Patient Account Number: [id number] Date of Birth/Sex: Treating RN: 1946/04/18 (76 y.o. Female) Dellie Catholic Primary Care Maritta Kief: Deland Pretty Other Clinician: Referring Juliano Mceachin: Treating  Malikah Lakey/Extender: Orest Dikes in Treatment: 0 Edema Assessment Assessed: [name]: No] [Right: No] E[Left: dema] [Right: :] Calf Left: Right: Point of Measurement: 29 cm From Medial Instep 36 cm Ankle Left: Right: Point of Measurement: 10 cm From Medial Instep 21.5 cm Knee To Floor Left: Right: From Medial Instep 40 cm Electronic Signature(s) Signed: 05/19/2022 5:39:10 PM By: Dellie Catholic RN Entered By: Dellie Catholic on 05/19/2022 08:55:14 -------------------------------------------------------------------------------- Multi-Disciplinary Care Plan Details Patient Name: Date of Service: Destiny Dark F. 05/19/2022 8:00 A M Medical Record Number: [id number] Patient Account Number: [id number] Date of Birth/Sex: Treating RN: 1946-04-15 (76 y.o. Female) Dellie Catholic Primary Care Markelle Najarian: Deland Pretty Other Clinician: Referring Holland Kotter: Treating Adden Strout/Extender: Orest Dikes in Treatment: 0 Active Inactive Wound/Skin Impairment Nursing Diagnoses: Impaired tissue integrity Goals: Patient/caregiver will verbalize understanding of skin care regimen Date Initiated: 05/19/2022 Target Resolution Date: 07/12/2022 Goal Status: Active Interventions: Assess ulceration(s) every visit Treatment Activities: Skin care regimen initiated : 05/19/2022 Notes: Electronic Signature(s) Signed: 05/19/2022 5:39:10 PM By: Dellie Catholic RN Entered By: Dellie Catholic on 05/19/2022 17:35:44 -------------------------------------------------------------------------------- Pain Assessment Details Patient Name: Date of Service: Destiny American. 05/19/2022 8:00 A M Medical Record Number: [id number] Patient Account Number: [id number] Date of Birth/Sex: Treating RN: August 04, 1946 (76 y.o. Female) Dellie Catholic Primary Care Kamayah Pillay: Deland Pretty Other Clinician: Referring Kasiah Manka: Treating Laury Huizar/Extender: Orest Dikes in Treatment: 0 Active Problems Location of Pain Severity and Description of Pain Patient Has Paino No Site Locations Pain Management and Medication Current Pain Management: Electronic Signature(s) Signed: 05/19/2022 5:39:10 PM By: Dellie Catholic RN Entered By: Dellie Catholic on 05/19/2022 08:57:31 -------------------------------------------------------------------------------- Patient/Caregiver Education Details Patient Name: Date of Service: Destiny [name], Destiny TTIE F. 8/7/2023andnbsp8:00 A M Medical Record Number: [id number] Patient Account Number: [id number] Date of Birth/Gender: Treating RN: 1946/01/19 (76 y.o. Female) Dellie Catholic Primary Care Physician: Deland Pretty Other Clinician: Referring Physician: Treating Physician/Extender: Orest Dikes in Treatment: 0 Education Assessment Education Provided To: Patient Education Topics Provided Wound/Skin Impairment: Methods: Explain/Verbal Responses: Return demonstration correctly Electronic Signature(s) Signed: 05/19/2022 5:39:10 PM By: Dellie Catholic RN Entered By: Dellie Catholic on 05/19/2022 17:35:53 -------------------------------------------------------------------------------- Wound Assessment Details Patient Name: Date of Service: Destiny American. 05/19/2022 8:00 A M Medical Record Number: [id number] Patient Account Number: [id number] Date of Birth/Sex: Treating RN: 02-21-1946 (76 y.o. Female) Dellie Catholic Primary Care Renwick Asman:  Deland Pretty Other Clinician: Referring Amana Bouska: Treating Ariana Juul/Extender: Orest Dikes in Treatment: 0 Wound Status Wound Number: 1 Primary Etiology: Trauma, Other Wound Location: Left Calcaneus Wound Status: Open Wounding Event: Hematoma Comorbid History: Type II Diabetes, Osteoarthritis Date Acquired: 05/05/2022 Weeks Of Treatment: 0 Clustered Wound: No Wound Measurements Length: (cm) 3.1 Width: (cm)  2.8 Depth: (cm) 0.2 Area: (cm) 6.817 Volume: (cm) 1.363 % Reduction in Area: 0% % Reduction in Volume: 0% Epithelialization: Small (1-33%) Tunneling: No Undermining: No Wound Description Classification: Full Thickness Without Exposed Support Structures Exudate Amount: Medium Exudate Type: Serosanguineous Exudate Color: red, brown Foul Odor After Cleansing: No Slough/Fibrino Yes Wound Bed Granulation Amount: Large (67-100%) Exposed Structure Granulation Quality: Red, Pink Fascia Exposed: No Necrotic Amount: Small (1-33%) Fat Layer (Subcutaneous Tissue) Exposed: Yes Necrotic Quality: Adherent Slough Tendon Exposed: No Muscle Exposed: No Joint Exposed: No Bone Exposed: No Treatment Notes Wound #1 (Calcaneus) Wound Laterality: Left Cleanser Soap and Water Discharge Instruction: May shower and wash wound with dial antibacterial soap and water prior to dressing change. Wound Cleanser Discharge Instruction: Cleanse the wound with wound cleanser prior to applying a clean dressing using gauze sponges, not tissue or cotton balls. Peri-Wound Care Topical Primary Dressing Hydrofera Blue Ready Foam, 2.5 x2.5 in Discharge Instruction: Apply to wound bed as instructed Secondary Dressing Woven Gauze Sponge, Non-Sterile 4x4 in Discharge Instruction: Apply over primary dressing as directed. Secured With Elastic Bandage 4 inch (ACE bandage) Discharge Instruction: Secure with ACE bandage as directed. Kerlix Roll Sterile, 4.5x3.1 (in/yd) Discharge Instruction: Secure with Kerlix as directed. 29M Medipore Soft Cloth Surgical T 2x10 (in/yd) ape Discharge Instruction: Secure with tape as directed. Compression Wrap Compression Stockings Add-Ons Electronic Signature(s) Signed: 05/19/2022 5:39:10 PM By: Dellie Catholic RN Entered By: Dellie Catholic on 05/19/2022 09:51:00 -------------------------------------------------------------------------------- Vitals Details Patient Name: Date  of Service: Destiny Dark F. 05/19/2022 8:00 A M Medical Record Number: [id number] Patient Account Number: [id number] Date of Birth/Sex: Treating RN: [birth year] (76 y.o. Female) Dellie Catholic Primary Care Legacy Carrender: Deland Pretty Other Clinician: Referring Vlada Uriostegui: Treating Buford Bremer/Extender: Orest Dikes in Treatment: 0 Vital Signs Time Taken: 08:34 Temperature (F): 98.2 Pulse (bpm): 72 Respiratory Rate (breaths/min): 16 Blood Pressure (mmHg): 109/75 Reference Range: 80 - 120 mg / dl Electronic Signature(s) Signed: 05/19/2022 5:39:10 PM By: Dellie Catholic RN Entered By: Dellie Catholic on 05/19/2022 08:35:03

## 2022-05-19 NOTE — Progress Notes (Signed)
Destiny [name] ([id number]) Visit Report for 05/19/2022 Abuse Risk Screen Details Patient Name: Date of Service: Destiny [name], Destiny [name] 05/19/2022 8:00 A M Medical Record Number: [id number] Patient Account Number: [id number] Date of Birth/Sex: Treating RN: 10/23/1945 (76 y.o. Female) Dellie Catholic Primary Care Emoni Yang: Deland Pretty Other Clinician: Referring Alexy Heldt: Treating Aldrick Derrig/Extender: Orest Dikes in Treatment: 0 Abuse Risk Screen Items Answer ABUSE RISK SCREEN: Has anyone close to you tried to hurt or harm you recentlyo No Do you feel uncomfortable with anyone in your familyo No Has anyone forced you do things that you didnt want to doo No Electronic Signature(s) Signed: 05/19/2022 5:39:10 PM By: Dellie Catholic RN Entered By: Dellie Catholic on 05/19/2022 08:45:00 -------------------------------------------------------------------------------- Activities of Daily Living Details Patient Name: Date of Service: Destiny [name] 05/19/2022 8:00 A M Medical Record Number: [id number] Patient Account Number: [id number] Date of Birth/Sex: Treating RN: Jun 24, 1946 (76 y.o. Female) Dellie Catholic Primary Care Wilmoth Rasnic: Deland Pretty Other Clinician: Referring Wilbon Obenchain: Treating Gilbert Narain/Extender: Orest Dikes in Treatment: 0 Activities of Daily Living Items Answer Activities of Daily Living (Please select one for each item) Drive Automobile Completely Able T Medications ake Completely Able Use T elephone Completely Able Care for Appearance Completely Able Use T oilet Completely Able Bath / Shower Completely Able Dress Self Completely Able Feed Self Completely Able Walk Completely Able Get In / Out Bed Completely Able Housework Completely Able Prepare Meals Completely Able Handle Money Completely Able Shop for Self Completely Able Electronic Signature(s) Signed: 05/19/2022 5:39:10 PM By: Dellie Catholic RN Entered  By: Dellie Catholic on 05/19/2022 08:46:16 -------------------------------------------------------------------------------- Education Screening Details Patient Name: Date of Service: Destiny Dark F. 05/19/2022 8:00 A M Medical Record Number: [id number] Patient Account Number: [id number] Date of Birth/Sex: Treating RN: [birth year] (76 y.o. Female) Dellie Catholic Primary Care Dade Rodin: Deland Pretty Other Clinician: Referring Alif Petrak: Treating Monike Bragdon/Extender: Orest Dikes in Treatment: 0 Learning Preferences/Education Level/Primary Language Preferred Language: Cleophus Molt Cognitive Barrier Language Barrier: No Translator Needed: No Memory Deficit: No Emotional Barrier: No Cultural/Religious Beliefs Affecting Medical Care: No Physical Barrier Impaired Vision: No Impaired Hearing: No Decreased Hand dexterity: No Knowledge/Comprehension Knowledge Level: High Comprehension Level: High Ability to understand written instructions: High Ability to understand verbal instructions: High Motivation Anxiety Level: Calm Cooperation: Cooperative Education Importance: Acknowledges Need Interest in Health Problems: Asks Questions Perception: Coherent Willingness to Engage in Self-Management High Activities: Readiness to Engage in Self-Management High Activities: Electronic Signature(s) Signed: 05/19/2022 5:39:10 PM By: Dellie Catholic RN Entered By: Dellie Catholic on 05/19/2022 08:47:19 -------------------------------------------------------------------------------- Fall Risk Assessment Details Patient Name: Date of Service: Destiny Dark F. 05/19/2022 8:00 A M Medical Record Number: [id number] Patient Account Number: [id number] Date of Birth/Sex: Treating RN: 1946-04-29 (76 y.o. Female) Dellie Catholic Primary Care Jaden Batchelder: Deland Pretty Other Clinician: Referring Simaya Lumadue: Treating Miner Koral/Extender: Orest Dikes in  Treatment: 0 Fall Risk Assessment Items Have you had 2 or more falls in the last 12 monthso 0 No Have you had any fall that resulted in injury in the last 12 monthso 0 No FALLS RISK SCREEN History of falling - immediate or within 3 months 0 No Secondary diagnosis (Do you have 2 or more medical diagnoseso) 0 No Ambulatory aid None/bed rest/wheelchair/nurse 0 No Crutches/cane/walker 0 No Furniture 0 No Intravenous therapy Access/Saline/Heparin Lock 0 No Gait/Transferring Normal/ bed rest/ wheelchair 0 No Weak (short steps with or without shuffle, stooped but able to lift head while walking, may seek 0  No support from furniture) Impaired (short steps with shuffle, may have difficulty arising from chair, head down, impaired 0 No balance) Mental Status Oriented to own ability 0 No Electronic Signature(s) Signed: 05/19/2022 5:39:10 PM By: Dellie Catholic RN Entered By: Dellie Catholic on 05/19/2022 08:47:25 -------------------------------------------------------------------------------- Foot Assessment Details Patient Name: Date of Service: Destiny Dark F. 05/19/2022 8:00 A M Medical Record Number: [id number] Patient Account Number: [id number] Date of Birth/Sex: Treating RN: 03/01/1946 (76 y.o. Female) Dellie Catholic Primary Care Liberti Appleton: Deland Pretty Other Clinician: Referring Margel Joens: Treating Joseandres Mazer/Extender: Orest Dikes in Treatment: 0 Foot Assessment Items Site Locations + = Sensation present, - = Sensation absent, C = Callus, U = Ulcer R = Redness, W = Warmth, M = Maceration, PU = Pre-ulcerative lesion F = Fissure, S = Swelling, D = Dryness Assessment Right: Left: Other Deformity: No No Prior Foot Ulcer: No No Prior Amputation: No No Charcot Joint: No No Ambulatory Status: Ambulatory Without Help Gait: Steady Electronic Signature(s) Signed: 05/19/2022 5:39:10 PM By: Dellie Catholic RN Entered By: Dellie Catholic on 05/19/2022  08:54:23 -------------------------------------------------------------------------------- Nutrition Risk Screening Details Patient Name: Date of Service: Destiny [name] 05/19/2022 8:00 A M Medical Record Number: [id number] Patient Account Number: [id number] Date of Birth/Sex: Treating RN: [birth year] (76 y.o. Female) Dellie Catholic Primary Care Fia Hebert: Deland Pretty Other Clinician: Referring Canyon Lohr: Treating Cal Gindlesperger/Extender: Orest Dikes in Treatment: 0 Height (in): Weight (lbs): Body Mass Index (BMI): Nutrition Risk Screening Items Score Screening NUTRITION RISK SCREEN: I have an illness or condition that made me change the kind and/or amount of food I eat 0 No I eat fewer than two meals per day 0 No I eat few fruits and vegetables, or milk products 0 No I have three or more drinks of beer, liquor or wine almost every day 0 No I have tooth or mouth problems that make it hard for me to eat 0 No I don't always have enough money to buy the food I need 0 No I eat alone most of the time 0 No I take three or more different prescribed or over-the-counter drugs a day 0 No Without wanting to, I have lost or gained 10 pounds in the last six months 0 No I am not always physically able to shop, cook and/or feed myself 0 No Nutrition Protocols Good Risk Protocol 0 No interventions needed Moderate Risk Protocol High Risk Proctocol Risk Level: Good Risk Score: 0 Electronic Signature(s) Signed: 05/19/2022 5:39:10 PM By: Dellie Catholic RN Entered By: Dellie Catholic on 05/19/2022 08:47:54

## 2022-05-20 ENCOUNTER — Other Ambulatory Visit: Payer: Self-pay

## 2022-05-20 ENCOUNTER — Ambulatory Visit
Admission: RE | Admit: 2022-05-20 | Discharge: 2022-05-20 | Disposition: A | Discharge status: Home / Self Care | Payer: PPO | Source: Ambulatory Visit | Attending: Radiation Oncology | Admitting: Radiation Oncology

## 2022-05-20 DIAGNOSIS — Z510 Encounter for antineoplastic radiation therapy: Secondary | ICD-10-CM | POA: Diagnosis not present

## 2022-05-20 DIAGNOSIS — C20 Malignant neoplasm of rectum: Secondary | ICD-10-CM | POA: Diagnosis not present

## 2022-05-20 LAB — RAD ONC ARIA SESSION SUMMARY
Course Elapsed Days: 29
Plan Fractions Treated to Date: 22
Plan Prescribed Dose Per Fraction: 1.8 Gy
Plan Total Fractions Prescribed: 25
Plan Total Prescribed Dose: 45 Gy
Reference Point Dosage Given to Date: 39.6 Gy
Reference Point Session Dosage Given: 1.8 Gy
Session Number: 22

## 2022-05-20 NOTE — Progress Notes (Signed)
Destiny [name], Destiny [name] ([id number]) Visit Report for 05/19/2022 Chief Complaint Document Details Patient Name: Date of Service: Destiny [name], Destiny [name] 05/19/2022 8:00 A M Medical Record Number: [id number] Patient Account Number: [id number] Date of Birth/Sex: Treating RN: 1946-02-19 (76 y.o. Female) Dellie Catholic Primary Care Provider: Deland Pretty Other Clinician: Referring Provider: Treating Provider/Extender: Orest Dikes in Treatment: 0 Information Obtained from: Patient Chief Complaint Patient presents to the wound care center with open non-healing surgical wound(s) Electronic Signature(s) Signed: 05/19/2022 9:46:21 AM By: Fredirick Maudlin MD FACS Entered By: Fredirick Maudlin on 05/19/2022 09:46:21 -------------------------------------------------------------------------------- Debridement Details Patient Name: Date of Service: Destiny Dark F. 05/19/2022 8:00 A M Medical Record Number: [id number] Patient Account Number: [id number] Date of Birth/Sex: Treating RN: 11-29-1945 (76 y.o. Female) Dellie Catholic Primary Care Provider: Deland Pretty Other Clinician: Referring Provider: Treating Provider/Extender: Orest Dikes in Treatment: 0 Debridement Performed for Assessment: Wound #1 Left Calcaneus Performed By: Physician Fredirick Maudlin, MD Debridement Type: Debridement Level of Consciousness (Pre-procedure): Awake and Alert Pre-procedure Verification/Time Out Yes - 09:05 Taken: Start Time: 09:05 Pain Control: Lidocaine 5% topical ointment T Area Debrided (L x W): otal 3.1 (cm) x 2.8 (cm) = 8.68 (cm) Tissue and other material debrided: Non-Viable, Slough, Subcutaneous, Slough Level: Skin/Subcutaneous Tissue Debridement Description: Excisional Instrument: Curette Bleeding: Minimum Hemostasis Achieved: Pressure End Time: 09:06 Procedural Pain: 0 Post Procedural Pain: 0 Response to Treatment: Procedure was tolerated well Level  of Consciousness (Post- Awake and Alert procedure): Post Debridement Measurements of Total Wound Length: (cm) 3.1 Width: (cm) 2.8 Depth: (cm) 0.2 Volume: (cm) 1.363 Character of Wound/Ulcer Post Debridement: Improved Post Procedure Diagnosis Same as Pre-procedure Electronic Signature(s) Signed: 05/19/2022 11:24:36 AM By: Fredirick Maudlin MD FACS Signed: 05/19/2022 5:39:10 PM By: Dellie Catholic RN Entered By: Dellie Catholic on 05/19/2022 09:14:58 -------------------------------------------------------------------------------- HPI Details Patient Name: Date of Service: Destiny Dark F. 05/19/2022 8:00 A M Medical Record Number: [id number] Patient Account Number: [id number] Date of Birth/Sex: Treating RN: 07/13/1946 (76 y.o. 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 After the final reexcision, the wound had an ACell graft applied. She reports that she had good closure of skin over the site. Earlier this year, at the end of May, however, she was diagnosed with rectal cancer and is currently undergoing neoadjuvant therapy (chemotherapy and radiation) in anticipation of future surgery. She reports that about 2 weeks ago, she noted that her skin coverage over the heel was beginning to break down. Her oncologist felt that perhaps the Xeloda was contributing to wound failure and stopped this medication. She continues to receive neoadjuvant radiation therapy, however. She saw plastic surgery last week, and they referred her to the wound care center for further evaluation and management. She is a type II diabetic, well controlled, with the most recent A1c available to me being 5.9%.  ABIs done 1 year ago were normal. On the patient's left heel, there is an irregular wound with some skin maceration. The fat layer is exposed and there is slough over the entire surface. Outside of the area of maceration, the skin is in good condition. No erythema, induration, or odor. No concern for infection. Electronic Signature(s) Signed: 05/19/2022 10:02:02 AM By: Fredirick Maudlin MD FACS Entered By: Fredirick Maudlin on 05/19/2022  10:02:02 -------------------------------------------------------------------------------- Physical Exam Details Patient Name: Date of Service: Destiny [name], Destiny [name] 05/19/2022 8:00 A M Medical Record Number: [id number] Patient Account Number: [id number] Date of Birth/Sex: Treating RN: September 16, 1946 (76 y.o. Female) Dellie Catholic Primary Care Provider: Deland Pretty Other Clinician: Referring Provider: Treating Provider/Extender: Orest Dikes in Treatment: 0 Constitutional . . . . No acute distress. Respiratory Normal work of breathing on room air.. Cardiovascular .Marland Kitchen Notes 05/19/2022: On the patient's left heel, there is an irregular wound with some skin maceration. The fat layer is exposed and there is slough over the entire surface. Outside of the area of maceration, the skin is in good condition. No erythema, induration, or odor. No concern for infection. Electronic Signature(s) Signed: 05/19/2022 10:25:58 AM By: Fredirick Maudlin MD FACS Previous Signature: 05/19/2022 10:23:04 AM Version By: Fredirick Maudlin MD FACS Entered By: Fredirick Maudlin on 05/19/2022 10:25:58 -------------------------------------------------------------------------------- Physician Orders Details Patient Name: Date of Service: Destiny Dark F. 05/19/2022 8:00 A M Medical Record Number: [id number] Patient Account Number: [id number] Date of Birth/Sex: Treating RN: [birth year] (76 y.o. Female) Dellie Catholic Primary Care Provider: Deland Pretty Other  Clinician: Referring Provider: Treating Provider/Extender: Orest Dikes in Treatment: 0 Verbal / Phone Orders: No Diagnosis Coding ICD-10 Coding Code Description [phone] Non-pressure chronic ulcer of left heel and midfoot with fat layer exposed T81.31XS Disruption of external operation (surgical) wound, not elsewhere classified, sequela C43.9 Malignant melanoma of skin, unspecified C20 Malignant neoplasm of rectum E11.9 Type 2 diabetes mellitus without complications I43.32 Personal history of antineoplastic chemotherapy Z92.3 Personal history of irradiation Follow-up Appointments ppointment in 1 week. - Dr Celine Ahr Room 3 on Monday 8/14 at 2:30pm Return A Bathing/ Shower/ Hygiene Other Bathing/Shower/Hygiene Orders/Instructions: - Change wound dressing after bathing Edema Control - Lymphedema / SCD / Other Avoid standing for long periods of time. Moisturize legs daily. Off-Loading Other: - Please wear off-loading shoe on left foot to keep pressure off the heel Wound Treatment Wound #1 - Calcaneus Wound Laterality: Left Cleanser: Soap and Water 1 x Per Day/30 Days Discharge Instructions: May shower and wash wound with dial antibacterial soap and water prior to dressing change. Cleanser: Wound Cleanser 1 x Per Day/30 Days Discharge Instructions: Cleanse the wound with wound cleanser prior to applying a clean dressing using gauze sponges, not tissue or cotton balls. Prim Dressing: Hydrofera Blue Ready Foam, 2.5 x2.5 in (DME) (Generic) 1 x Per Day/30 Days ary Discharge Instructions: Apply to wound bed as instructed Secondary Dressing: Woven Gauze Sponge, Non-Sterile 4x4 in 1 x Per Day/30 Days Discharge Instructions: Apply over primary dressing as directed. Secured With: Elastic Bandage 4 inch (ACE bandage) 1 x Per Day/30 Days Discharge Instructions: Secure with ACE bandage as directed. Secured With: [employer], 4.5x3.1 (in/yd) 1 x Per Day/30  Days Discharge Instructions: Secure with Kerlix as directed. Secured With: 48M Medipore Public affairs consultant Surgical T 2x10 (in/yd) (DME) (Generic) 1 x Per Day/30 Days ape Discharge Instructions: Secure with tape as directed. Electronic Signature(s) Signed: 05/19/2022 11:24:36 AM By: Fredirick Maudlin MD FACS Entered By: Fredirick Maudlin on 05/19/2022 10:26:35 -------------------------------------------------------------------------------- Problem List Details Patient Name: Date of Service: Destiny Dark F. 05/19/2022 8:00 A M Medical Record Number: [id number] Patient Account Number: [id number] Date of Birth/Sex: Treating RN: 08/04/1946 (76 y.o. Female) Dellie Catholic Primary Care Provider: Deland Pretty Other Clinician: Referring Provider: Treating Provider/Extender: Orest Dikes in Treatment: 0 Active Problems ICD-10 Encounter Code Description Active Date MDM Diagnosis L97.422 Non-pressure chronic  ulcer of left heel and midfoot with fat layer exposed 05/19/2022 No Yes T81.31XS Disruption of external operation (surgical) wound, not elsewhere classified, 05/19/2022 No Yes sequela C43.9 Malignant melanoma of skin, unspecified 05/19/2022 No Yes C20 Malignant neoplasm of rectum 05/19/2022 No Yes E11.9 Type 2 diabetes mellitus without complications 7/0/0174 No Yes Z92.21 Personal history of antineoplastic chemotherapy 05/19/2022 No Yes Z92.3 Personal history of irradiation 05/19/2022 No Yes Inactive Problems Resolved Problems Electronic Signature(s) Signed: 05/19/2022 9:46:08 AM By: Fredirick Maudlin MD FACS Previous Signature: 05/19/2022 9:43:29 AM Version By: Fredirick Maudlin MD FACS Entered By: Fredirick Maudlin on 05/19/2022 09:46:07 -------------------------------------------------------------------------------- Progress Note Details Patient Name: Date of Service: Destiny Dark F. 05/19/2022 8:00 A M Medical Record Number: [id number] Patient Account Number: [id number] Date  of Birth/Sex: Treating RN: May 17, 1946 (76 y.o. 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 After the final reexcision, the wound had an ACell graft applied. She reports that she had good closure of skin over the site. Earlier this year, at the end of May, however, she was diagnosed with rectal cancer and is currently undergoing neoadjuvant therapy (chemotherapy and radiation) in anticipation of future surgery. She reports that about 2 weeks ago, she noted that her skin coverage over the heel was beginning to break down. Her oncologist felt that perhaps the Xeloda was contributing to wound failure and stopped this medication. She continues to receive neoadjuvant radiation therapy, however. She saw plastic surgery last week, and they referred her to the wound care center for further evaluation and management. She is a type II diabetic, well controlled, with the most recent A1c available to me being 5.9%. ABIs done 1 year ago were normal. On the patient's left heel, there is an irregular wound with some skin maceration. The fat layer is exposed and there is slough over the entire surface. Outside of the area of maceration, the skin is in good condition. No erythema, induration, or odor. No concern for infection. Patient History Information obtained from Patient. Allergies propofol (Severity: Severe), aspirin (Severity: Severe), atorvastatin (Severity: Severe), Lisinopril (Severity: Severe), hydrocodone (Severity: Severe), penicillin (Severity:  Severe) Social History Former smoker - 1991, Marital Status - Divorced, Alcohol Use - Never, Drug Use - No History, Caffeine Use - Moderate - Diet coke,coffee. Medical History Endocrine Patient has history of Type II Diabetes - Metformin pillsand Ozempic Musculoskeletal Patient has history of Osteoarthritis Patient is treated with Oral Agents. Blood sugar is tested. Hospitalization/Surgery History - Melanoma excision to left heel;ORIF Left ankle fracture;Marland Kitchen Medical A Surgical History Notes nd Eyes Macular Degeneration Ear/Nose/Mouth/Throat Tinnitus Oncologic 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 has had Chemotherapy (stopped) and Radiation -8 treatments left as of 05/19/22 Rectal cancer Psychiatric Hx Depression Review of Systems (ROS) Constitutional Symptoms (General Health) Denies complaints or symptoms of Fatigue, Fever, Chills, Marked Weight Change. Ear/Nose/Mouth/Throat Denies complaints or symptoms of Chronic sinus problems or rhinitis. Respiratory Denies complaints or symptoms of Chronic or frequent coughs, Shortness of Breath. Cardiovascular Denies complaints or symptoms of Chest pain. Gastrointestinal Complains or has symptoms of Frequent diarrhea - R/T Radiation treatments. Genitourinary Denies complaints or symptoms of Frequent urination. Integumentary (Skin) Complains or has symptoms of Wounds -  Left foot r/t melanoma and radiation. Neurologic Denies complaints or symptoms of Numbness/parasthesias. Psychiatric Denies complaints or symptoms of Claustrophobia, Suicidal. Objective Constitutional No acute distress. Vitals Time Taken: 8:34 AM, Temperature: 98.2 F, Pulse: 72 bpm, Respiratory Rate: 16 breaths/min, Blood Pressure: 109/75 mmHg. Respiratory Normal work of breathing on room air.. General Notes: 05/19/2022: On the patient's left heel, there is an irregular wound with some skin maceration. The fat layer is exposed and there is slough over the entire surface. Outside of the  area of maceration, the skin is in good condition. No erythema, induration, or odor. No concern for infection. Integumentary (Hair, Skin) Wound #1 status is Open. Original cause of wound was Hematoma. The date acquired was: 05/05/2022. The wound is located on the Left Calcaneus. The wound measures 3.1cm length x 2.8cm width x 0.2cm depth; 6.817cm^2 area and 1.363cm^3 volume. There is Fat Layer (Subcutaneous Tissue) exposed. There is no tunneling or undermining noted. There is a medium amount of serosanguineous drainage noted. There is large (67-100%) red, pink granulation within the wound bed. There is a small (1-33%) amount of necrotic tissue within the wound bed including Adherent Slough. Assessment Active Problems ICD-10 Non-pressure chronic ulcer of left heel and midfoot with fat layer exposed Disruption of external operation (surgical) wound, not elsewhere classified, sequela Malignant melanoma of skin, unspecified Malignant neoplasm of rectum Type 2 diabetes mellitus without complications Personal history of antineoplastic chemotherapy Personal history of irradiation Procedures Wound #1 Pre-procedure diagnosis of Wound #1 is a T be determined located on the Left Calcaneus . There was a Excisional Skin/Subcutaneous Tissue Debridement o with a total area of 8.68 sq cm performed by Fredirick Maudlin, MD. With the following instrument(s): Curette to remove Non-Viable tissue/material. Material removed includes Subcutaneous Tissue and Slough and after achieving pain control using Lidocaine 5% topical ointment. No specimens were taken. A time out was conducted at 09:05, prior to the start of the procedure. A Minimum amount of bleeding was controlled with Pressure. The procedure was tolerated well with a pain level of 0 throughout and a pain level of 0 following the procedure. Post Debridement Measurements: 3.1cm length x 2.8cm width x 0.2cm depth; 1.363cm^3 volume. Character of Wound/Ulcer Post  Debridement is improved. Post procedure Diagnosis Wound #1: Same as Pre-Procedure Plan Follow-up Appointments: Return Appointment in 1 week. - Dr Celine Ahr Room 3 on Monday 8/14 at 2:30pm Bathing/ Shower/ Hygiene: Other Bathing/Shower/Hygiene Orders/Instructions: - Change wound dressing after bathing Edema Control - Lymphedema / SCD / Other: Avoid standing for long periods of time. Moisturize legs daily. Off-Loading: Other: - Please wear off-loading shoe on left foot to keep pressure off the heel WOUND #1: - Calcaneus Wound Laterality: Left Cleanser: Soap and Water 1 x Per Day/30 Days Discharge Instructions: May shower and wash wound with dial antibacterial soap and water prior to dressing change. Cleanser: Wound Cleanser 1 x Per Day/30 Days Discharge Instructions: Cleanse the wound with wound cleanser prior to applying a clean dressing using gauze sponges, not tissue or cotton balls. Prim Dressing: Hydrofera Blue Ready Foam, 2.5 x2.5 in (DME) (Generic) 1 x Per Day/30 Days ary Discharge Instructions: Apply to wound bed as instructed Secondary Dressing: Woven Gauze Sponge, Non-Sterile 4x4 in 1 x Per Day/30 Days Discharge Instructions: Apply over primary dressing as directed. Secured With: Elastic Bandage 4 inch (ACE bandage) 1 x Per Day/30 Days Discharge Instructions: Secure with ACE bandage as directed. Secured With: [employer], 4.5x3.1 (in/yd) 1 x Per Day/30 Days Discharge Instructions: Secure  with Kerlix as directed. Secured With: 85M Medipore Public affairs consultant Surgical T 2x10 (in/yd) (DME) (Generic) 1 x Per Day/30 Days ape Discharge Instructions: Secure with tape as directed. 05/19/2022: This is a 76 year old woman who had a melanoma excised from her heel a year ago. She recently began neoadjuvant chemotherapy and radiation for a rectal cancer. The site of her previous surgical treatment has broken down. On the patient's left heel, there is an irregular wound with some skin  maceration. The fat layer is exposed and there is slough over the entire surface. Outside of the area of maceration, the skin is in good condition. No erythema, induration, or odor. No concern for infection. I used a curette to debride slough and nonviable subcutaneous tissue from the wound surface. Due to the moisture imbalance, I am going to use Hydrofera Blue ready to try and dry it up a little bit, as well as clean up the surface without it becoming too adherent. She was advised to elevate her leg throughout the day and at night and we will also place her in a heel offloading shoe. Follow-up in 1 week. Electronic Signature(s) Signed: 05/19/2022 10:28:09 AM By: Fredirick Maudlin MD FACS Entered By: Fredirick Maudlin on 05/19/2022 10:28:09 -------------------------------------------------------------------------------- HxROS Details Patient Name: Date of Service: Destiny Dark F. 05/19/2022 8:00 A M Medical Record Number: [id number] Patient Account Number: [id number] Date of Birth/Sex: Treating RN: 1946-08-14 (76 y.o. Female) Dellie Catholic Primary Care Provider: Deland Pretty Other Clinician: Referring Provider: Treating Provider/Extender: Orest Dikes in Treatment: 0 Information Obtained From Patient Constitutional Symptoms (General Health) Complaints and Symptoms: Negative for: Fatigue; Fever; Chills; Marked Weight Change Ear/Nose/Mouth/Throat Complaints and Symptoms: Negative for: Chronic sinus problems or rhinitis Medical History: Past Medical History Notes: Tinnitus Respiratory Complaints and Symptoms: Negative for: Chronic or frequent coughs; Shortness of Breath Cardiovascular Complaints and Symptoms: Negative for: Chest pain Gastrointestinal Complaints and Symptoms: Positive for: Frequent diarrhea - R/T Radiation treatments Genitourinary Complaints and Symptoms: Negative for: Frequent urination Integumentary (Skin) Complaints and  Symptoms: Positive for: Wounds - Left foot r/t melanoma and radiation Neurologic Complaints and Symptoms: Negative for: Numbness/parasthesias Psychiatric Complaints and Symptoms: Negative for: Claustrophobia; Suicidal Medical History: Past Medical History Notes: Hx Depression Eyes Medical History: Past Medical History Notes: Macular Degeneration Hematologic/Lymphatic Endocrine Medical History: Positive for: Type II Diabetes - Metformin pillsand Ozempic Time with diabetes: 25 years Treated with: Oral agents Blood sugar tested every day: Yes Tested : every day Immunological Musculoskeletal Medical History: Positive for: Osteoarthritis Oncologic Medical History: Past Medical History Notes: 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 has had Chemotherapy (stopped) and Radiation -8 treatments left as of 05/19/22 Rectal cancer Immunizations Pneumococcal Vaccine: Received Pneumococcal Vaccination: Yes Received Pneumococcal Vaccination On or After 60th Birthday: Yes Implantable Devices None Hospitalization / Surgery History Type of Hospitalization/Surgery Melanoma excision to left heel;ORIF Left ankle fracture; Family and Social History Former smoker - 1991; Marital Status - Divorced; Alcohol Use: Never; Drug Use: No History; Caffeine Use: Moderate - Diet coke,coffee; Financial Concerns: No; Food, Clothing or Shelter Needs: No; Support System Lacking: No; Transportation Concerns: No Electronic Signature(s) Signed: 05/19/2022 11:24:36 AM By: Fredirick Maudlin MD FACS Signed: 05/19/2022 5:39:10 PM By: Dellie Catholic RN Entered By: Dellie Catholic on 05/19/2022 09:10:53 -------------------------------------------------------------------------------- SuperBill Details Patient Name: Date of Service: Destiny [name] 05/19/2022 Medical Record Number: [id number] Patient Account Number: [id number] Date of Birth/Sex: Treating RN: January 05, 1946 (76 y.o. Female) Dellie Catholic Primary Care Provider: Deland Pretty Other Clinician: Referring Provider: Treating Provider/Extender:  Fredirick Maudlin Okey Regal Weeks in Treatment: 0 Diagnosis Coding ICD-10 Codes Code Description [phone] Non-pressure chronic ulcer of left heel and midfoot with fat layer exposed T81.31XS Disruption of external operation (surgical) wound, not elsewhere classified, sequela C43.9 Malignant melanoma of skin, unspecified C20 Malignant neoplasm of rectum E11.9 Type 2 diabetes mellitus without complications E99.37 Personal history of antineoplastic chemotherapy Z92.3 Personal history of irradiation Facility Procedures CPT4 Code: [id number] Description: [place] VISIT-LEV 3 EST PT Modifier: Quantity: 1 CPT4 Code: [id number] Description: 11042 - DEB SUBQ TISSUE 20 SQ CM/< ICD-10 Diagnosis Description L97.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 Modifier: Quantity: 1 Physician Procedures : CPT4 Code Description Modifier [id number] - WC PHYS LEVEL 4 - NEW PT 25 ICD-10 Diagnosis Description L97.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 T81.31XS Disruption of external operation (surgical) wound, not  elsewhere classified, sequela Z92.21 Personal history of antineoplastic chemotherapy C43.9 Malignant melanoma of skin, unspecified Quantity: 1 : [id number] - WC PHYS SUBQ TISS 20 SQ CM ICD-10 Diagnosis Description L97.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

## 2022-05-21 ENCOUNTER — Other Ambulatory Visit: Payer: Self-pay

## 2022-05-21 ENCOUNTER — Ambulatory Visit
Admission: RE | Admit: 2022-05-21 | Discharge: 2022-05-21 | Disposition: A | Discharge status: Home / Self Care | Payer: PPO | Source: Ambulatory Visit | Attending: Radiation Oncology | Admitting: Radiation Oncology

## 2022-05-21 DIAGNOSIS — C20 Malignant neoplasm of rectum: Secondary | ICD-10-CM | POA: Diagnosis not present

## 2022-05-21 DIAGNOSIS — Z510 Encounter for antineoplastic radiation therapy: Secondary | ICD-10-CM | POA: Diagnosis not present

## 2022-05-21 LAB — RAD ONC ARIA SESSION SUMMARY
Course Elapsed Days: 30
Plan Fractions Treated to Date: 23
Plan Prescribed Dose Per Fraction: 1.8 Gy
Plan Total Fractions Prescribed: 25
Plan Total Prescribed Dose: 45 Gy
Reference Point Dosage Given to Date: 41.4 Gy
Reference Point Session Dosage Given: 1.8 Gy
Session Number: 23

## 2022-05-22 ENCOUNTER — Ambulatory Visit
Admission: RE | Admit: 2022-05-22 | Discharge: 2022-05-22 | Disposition: A | Discharge status: Home / Self Care | Payer: PPO | Source: Ambulatory Visit | Attending: Radiation Oncology | Admitting: Radiation Oncology

## 2022-05-22 ENCOUNTER — Other Ambulatory Visit: Payer: Self-pay

## 2022-05-22 DIAGNOSIS — Z510 Encounter for antineoplastic radiation therapy: Secondary | ICD-10-CM | POA: Diagnosis not present

## 2022-05-22 DIAGNOSIS — C20 Malignant neoplasm of rectum: Secondary | ICD-10-CM | POA: Diagnosis not present

## 2022-05-22 LAB — RAD ONC ARIA SESSION SUMMARY
Course Elapsed Days: 31
Plan Fractions Treated to Date: 24
Plan Prescribed Dose Per Fraction: 1.8 Gy
Plan Total Fractions Prescribed: 25
Plan Total Prescribed Dose: 45 Gy
Reference Point Dosage Given to Date: 43.2 Gy
Reference Point Session Dosage Given: 1.8 Gy
Session Number: 24

## 2022-05-23 ENCOUNTER — Ambulatory Visit
Admission: RE | Admit: 2022-05-23 | Discharge: 2022-05-23 | Disposition: A | Discharge status: Home / Self Care | Payer: PPO | Source: Ambulatory Visit | Attending: Radiation Oncology | Admitting: Radiation Oncology

## 2022-05-23 ENCOUNTER — Other Ambulatory Visit: Payer: Self-pay

## 2022-05-23 DIAGNOSIS — Z510 Encounter for antineoplastic radiation therapy: Secondary | ICD-10-CM | POA: Diagnosis not present

## 2022-05-23 DIAGNOSIS — C20 Malignant neoplasm of rectum: Secondary | ICD-10-CM | POA: Diagnosis not present

## 2022-05-23 LAB — RAD ONC ARIA SESSION SUMMARY
Course Elapsed Days: 32
Plan Fractions Treated to Date: 25
Plan Prescribed Dose Per Fraction: 1.8 Gy
Plan Total Fractions Prescribed: 25
Plan Total Prescribed Dose: 45 Gy
Reference Point Dosage Given to Date: 45 Gy
Reference Point Session Dosage Given: 1.8 Gy
Session Number: 25

## 2022-05-26 ENCOUNTER — Other Ambulatory Visit: Payer: HMO

## 2022-05-26 ENCOUNTER — Encounter (HOSPITAL_BASED_OUTPATIENT_CLINIC_OR_DEPARTMENT_OTHER): Payer: PPO | Admitting: General Surgery

## 2022-05-26 ENCOUNTER — Ambulatory Visit: Payer: HMO | Admitting: Oncology

## 2022-05-26 ENCOUNTER — Ambulatory Visit: Payer: PPO | Admitting: Physician Assistant

## 2022-05-26 ENCOUNTER — Ambulatory Visit
Admission: RE | Admit: 2022-05-26 | Discharge: 2022-05-26 | Disposition: A | Discharge status: Home / Self Care | Payer: PPO | Source: Ambulatory Visit | Attending: Radiation Oncology | Admitting: Radiation Oncology

## 2022-05-26 ENCOUNTER — Other Ambulatory Visit: Payer: Self-pay

## 2022-05-26 DIAGNOSIS — E11621 Type 2 diabetes mellitus with foot ulcer: Secondary | ICD-10-CM | POA: Diagnosis not present

## 2022-05-26 DIAGNOSIS — C20 Malignant neoplasm of rectum: Secondary | ICD-10-CM | POA: Diagnosis not present

## 2022-05-26 DIAGNOSIS — T8131XA Disruption of external operation (surgical) wound, not elsewhere classified, initial encounter: Secondary | ICD-10-CM | POA: Diagnosis not present

## 2022-05-26 DIAGNOSIS — Z510 Encounter for antineoplastic radiation therapy: Secondary | ICD-10-CM | POA: Diagnosis not present

## 2022-05-26 LAB — RAD ONC ARIA SESSION SUMMARY
Course Elapsed Days: 35
Plan Fractions Treated to Date: 1
Plan Prescribed Dose Per Fraction: 1.8 Gy
Plan Total Fractions Prescribed: 3
Plan Total Prescribed Dose: 5.4 Gy
Reference Point Dosage Given to Date: 46.8 Gy
Reference Point Session Dosage Given: 1.8 Gy
Session Number: 26

## 2022-05-27 ENCOUNTER — Ambulatory Visit
Admission: RE | Admit: 2022-05-27 | Discharge: 2022-05-27 | Disposition: A | Discharge status: Home / Self Care | Payer: PPO | Source: Ambulatory Visit | Attending: Radiation Oncology | Admitting: Radiation Oncology

## 2022-05-27 ENCOUNTER — Other Ambulatory Visit: Payer: Self-pay

## 2022-05-27 DIAGNOSIS — Z510 Encounter for antineoplastic radiation therapy: Secondary | ICD-10-CM | POA: Diagnosis not present

## 2022-05-27 LAB — RAD ONC ARIA SESSION SUMMARY
Course Elapsed Days: 36
Plan Fractions Treated to Date: 2
Plan Prescribed Dose Per Fraction: 1.8 Gy
Plan Total Fractions Prescribed: 3
Plan Total Prescribed Dose: 5.4 Gy
Reference Point Dosage Given to Date: 48.6 Gy
Reference Point Session Dosage Given: 1.8 Gy
Session Number: 27

## 2022-05-28 ENCOUNTER — Encounter: Payer: Self-pay | Admitting: Radiation Oncology

## 2022-05-28 ENCOUNTER — Other Ambulatory Visit: Payer: Self-pay

## 2022-05-28 ENCOUNTER — Ambulatory Visit
Admission: RE | Admit: 2022-05-28 | Discharge: 2022-05-28 | Disposition: A | Discharge status: Home / Self Care | Payer: PPO | Source: Ambulatory Visit | Attending: Radiation Oncology | Admitting: Radiation Oncology

## 2022-05-28 ENCOUNTER — Encounter (HOSPITAL_BASED_OUTPATIENT_CLINIC_OR_DEPARTMENT_OTHER): Payer: PPO | Admitting: General Surgery

## 2022-05-28 ENCOUNTER — Ambulatory Visit: Payer: PPO

## 2022-05-28 DIAGNOSIS — C20 Malignant neoplasm of rectum: Secondary | ICD-10-CM | POA: Diagnosis not present

## 2022-05-28 DIAGNOSIS — Z510 Encounter for antineoplastic radiation therapy: Secondary | ICD-10-CM | POA: Diagnosis not present

## 2022-05-28 LAB — RAD ONC ARIA SESSION SUMMARY
Course Elapsed Days: 37
Plan Fractions Treated to Date: 3
Plan Prescribed Dose Per Fraction: 1.8 Gy
Plan Total Fractions Prescribed: 3
Plan Total Prescribed Dose: 5.4 Gy
Reference Point Dosage Given to Date: 50.4 Gy
Reference Point Session Dosage Given: 1.8 Gy
Session Number: 28

## 2022-05-29 NOTE — Progress Notes (Signed)
                                                                                                                                                             Patient Name: Destiny [name] MRN: [id number] DOB: 09/08/1946 Referring Physician: Betsy Coder (Profile Not Attached) Date of Service: 05/28/2022 [facility], [place]                                                        End Of Treatment Note  Diagnoses: C20-Malignant neoplasm of rectum  Cancer Staging: Stage II, cT3N0M0 adenocarcinoma of the rectum  Intent: Curative  Radiation Treatment Dates: 04/21/2022 through 05/28/2022 Site Technique Total Dose (Gy) Dose per Fx (Gy) Completed Fx Beam Energies  Rectum: Rectum 3D 45/45 1.8 25/25 15X  Rectum: Rectum_Bst 3D 5.4/5.4 1.8 3/3 15X   Narrative: She developed fatigue and anticipated skin changes in the treatment field as well as nausea, and diarrhea.   Plan: The patient will receive a call in about one month from the radiation oncology department. She will continue follow up with Dr. Benay Spice as well as Dr. Marcello Moores in colorectal surgery.  ________________________________________________    Carola Rhine, PAC

## 2022-05-30 NOTE — Progress Notes (Signed)
MYEASHA, BALLOWE ([id number]) Visit Report for 05/26/2022 Arrival Information Details Patient Name: Date of Service: Destiny [name], Destiny [name] 05/26/2022 2:30 PM Medical Record Number: [id number] Patient Account Number: [id number] Date of Birth/Sex: Treating RN: 1946-07-19 (76 y.o. [name] Primary Care Mylee Falin: Deland Pretty Other Clinician: Referring Rochanda Harpham: Treating Elzie Knisley/Extender: Raylene Everts in Treatment: 1 Visit Information History Since Last Visit Added or deleted any medications: No Patient Arrived: Ambulatory Any new allergies or adverse reactions: No Arrival Time: 14:54 Had a fall or experienced change in No Accompanied By: self activities of daily living that may affect Transfer Assistance: None risk of falls: Patient Identification Verified: Yes Signs or symptoms of abuse/neglect since last visito No Hospitalized since last visit: No Has Dressing in Place as Prescribed: Yes Pain Present Now: No Electronic Signature(s) Signed: 05/26/2022 4:37:22 PM By: Dellie Catholic RN Entered By: Dellie Catholic on 05/26/2022 14:55:03 -------------------------------------------------------------------------------- Clinic Level of Care Assessment Details Patient Name: Date of Service: Destiny [name], Destiny [name] 05/26/2022 2:30 PM Medical Record Number: [id number] Patient Account Number: [id number] Date of Birth/Sex: Treating RN: [birth year] (76 y.o. [name] Primary Care Anabeth Chilcott: Deland Pretty Other Clinician: Referring Treacy Holcomb: Treating Avion Patella/Extender: Raylene Everts in Treatment: 1 Clinic Level of Care Assessment Items TOOL 4 Quantity Score X- 1 0 Use when only an EandM is performed on FOLLOW-UP visit ASSESSMENTS - Nursing Assessment / Reassessment X- 1 10 Reassessment of Co-morbidities (includes updates in patient status) X- 1 5 Reassessment of Adherence to Treatment Plan ASSESSMENTS - Wound and Skin A  ssessment / Reassessment X - Simple Wound Assessment / Reassessment - one wound 1 5 '[]'$  - 0 Complex Wound Assessment / Reassessment - multiple wounds '[]'$  - 0 Dermatologic / Skin Assessment (not related to wound area) ASSESSMENTS - Focused Assessment '[]'$  - 0 Circumferential Edema Measurements - multi extremities '[]'$  - 0 Nutritional Assessment / Counseling / Intervention '[]'$  - 0 Lower Extremity Assessment (monofilament, tuning fork, pulses) '[]'$  - 0 Peripheral Arterial Disease Assessment (using hand held doppler) ASSESSMENTS - Ostomy and/or Continence Assessment and Care '[]'$  - 0 Incontinence Assessment and Management '[]'$  - 0 Ostomy Care Assessment and Management (repouching, etc.) PROCESS - Coordination of Care X - Simple Patient / Family Education for ongoing care 1 15 '[]'$  - 0 Complex (extensive) Patient / Family Education for ongoing care X- 1 10 Staff obtains Programmer, systems, Records, T Results / Process Orders est X- 1 10 Staff telephones HHA, Nursing Homes / Clarify orders / etc '[]'$  - 0 Routine Transfer to another Facility (non-emergent condition) '[]'$  - 0 Routine Hospital Admission (non-emergent condition) '[]'$  - 0 New Admissions / Biomedical engineer / Ordering NPWT Apligraf, etc. , '[]'$  - 0 Emergency Hospital Admission (emergent condition) X- 1 10 Simple Discharge Coordination '[]'$  - 0 Complex (extensive) Discharge Coordination PROCESS - Special Needs '[]'$  - 0 Pediatric / Minor Patient Management '[]'$  - 0 Isolation Patient Management '[]'$  - 0 Hearing / Language / Visual special needs '[]'$  - 0 Assessment of Community assistance (transportation, D/C planning, etc.) '[]'$  - 0 Additional assistance / Altered mentation '[]'$  - 0 Support Surface(s) Assessment (bed, cushion, seat, etc.) INTERVENTIONS - Wound Cleansing / Measurement X - Simple Wound Cleansing - one wound 1 5 '[]'$  - 0 Complex Wound Cleansing - multiple wounds X- 1 5 Wound Imaging (photographs - any number of wounds) '[]'$  -  0 Wound Tracing (instead of photographs) X- 1 5 Simple Wound Measurement - one wound '[]'$  - 0 Complex Wound Measurement -  multiple wounds INTERVENTIONS - Wound Dressings X - Small Wound Dressing one or multiple wounds 1 10 '[]'$  - 0 Medium Wound Dressing one or multiple wounds '[]'$  - 0 Large Wound Dressing one or multiple wounds '[]'$  - 0 Application of Medications - topical '[]'$  - 0 Application of Medications - injection INTERVENTIONS - Miscellaneous '[]'$  - 0 External ear exam '[]'$  - 0 Specimen Collection (cultures, biopsies, blood, body fluids, etc.) '[]'$  - 0 Specimen(s) / Culture(s) sent or taken to Lab for analysis '[]'$  - 0 Patient Transfer (multiple staff / Harrel Lemon Lift / Similar devices) '[]'$  - 0 Simple Staple / Suture removal (25 or less) '[]'$  - 0 Complex Staple / Suture removal (26 or more) '[]'$  - 0 Hypo / Hyperglycemic Management (close monitor of Blood Glucose) '[]'$  - 0 Ankle / Brachial Index (ABI) - do not check if billed separately X- 1 5 Vital Signs Has the patient been seen at the hospital within the last three years: Yes Total Score: 95 Level Of Care: New/Established - Level 3 Electronic Signature(s) Signed: 05/26/2022 4:37:22 PM By: Dellie Catholic RN Entered By: Dellie Catholic on 05/26/2022 16:35:58 -------------------------------------------------------------------------------- Encounter Discharge Information Details Patient Name: Date of Service: Destiny Dark F. 05/26/2022 2:30 PM Medical Record Number: [id number] Patient Account Number: [id number] Date of Birth/Sex: Treating RN: [birth year] (76 y.o. [name] Primary Care Gianpaolo Mindel: Deland Pretty Other Clinician: Referring Talana Slatten: Treating Kenry Daubert/Extender: Raylene Everts in Treatment: 1 Encounter Discharge Information Items Discharge Condition: Stable Ambulatory Status: Ambulatory Discharge Destination: Home Transportation: Private Auto Accompanied By: self Schedule Follow-up  Appointment: Yes Clinical Summary of Care: Patient Declined Electronic Signature(s) Signed: 05/26/2022 4:37:22 PM By: Dellie Catholic RN Entered By: Dellie Catholic on 05/26/2022 16:36:47 -------------------------------------------------------------------------------- Lower Extremity Assessment Details Patient Name: Date of Service: Destiny [name], Destiny [name] 05/26/2022 2:30 PM Medical Record Number: [id number] Patient Account Number: [id number] Date of Birth/Sex: Treating RN: July 20, 1946 (76 y.o. [name] Primary Care Sakeena Teall: Deland Pretty Other Clinician: Referring Yassmin Binegar: Treating David Towson/Extender: Raylene Everts in Treatment: 1 Edema Assessment Assessed: [Left: No] [Right: No] E[Left: dema] [Right: :] Calf Left: Right: Point of Measurement: 29 cm From Medial Instep 35.7 cm Ankle Left: Right: Point of Measurement: 10 cm From Medial Instep 21.6 cm Electronic Signature(s) Signed: 05/26/2022 4:37:22 PM By: Dellie Catholic RN Entered By: Dellie Catholic on 05/26/2022 15:06:16 -------------------------------------------------------------------------------- Multi Wound Chart Details Patient Name: Date of Service: Destiny [name]. 05/26/2022 2:30 PM Medical Record Number: [id number] Patient Account Number: [id number] Date of Birth/Sex: Treating RN: February 06, 1946 (76 y.o. F) Primary Care Dyquan Minks: Deland Pretty Other Clinician: Referring Magenta Schmiesing: Treating Jaxsin Bottomley/Extender: Raylene Everts in Treatment: 1 Vital Signs Height(in): 63 Pulse(bpm): 66 Weight(lbs): Blood Pressure(mmHg): 100/66 Body Mass Index(BMI): Temperature(F): 99.5 Respiratory Rate(breaths/min): 16 Photos: [N/A:N/A] Left Calcaneus N/A N/A Wound Location: Hematoma N/A N/A Wounding Event: Trauma, Other N/A N/A Primary Etiology: Type II Diabetes, Osteoarthritis N/A N/A Comorbid History: 05/05/2022 N/A N/A Date Acquired: 1 N/A N/A Weeks of  Treatment: Open N/A N/A Wound Status: No N/A N/A Wound Recurrence: 3x2.6x0.1 N/A N/A Measurements L x W x D (cm) 6.126 N/A N/A A (cm) : rea 0.613 N/A N/A Volume (cm) : 10.10% N/A N/A % Reduction in A rea: 55.00% N/A N/A % Reduction in Volume: Full Thickness Without Exposed N/A N/A Classification: Support Structures Medium N/A N/A Exudate Amount: Serosanguineous N/A N/A Exudate Type: red, brown N/A N/A Exudate Color: Large (67-100%) N/A N/A Granulation Amount: Red, Hyper-granulation N/A N/A Granulation Quality:  Small (1-33%) N/A N/A Necrotic Amount: Fat Layer (Subcutaneous Tissue): Yes N/A N/A Exposed Structures: Fascia: No Tendon: No Muscle: No Joint: No Bone: No Small (1-33%) N/A N/A Epithelialization: Treatment Notes Electronic Signature(s) Signed: 05/26/2022 3:27:05 PM By: Fredirick Maudlin MD FACS Entered By: Fredirick Maudlin on 05/26/2022 15:27:04 -------------------------------------------------------------------------------- Multi-Disciplinary Care Plan Details Patient Name: Date of Service: Destiny Dark F. 05/26/2022 2:30 PM Medical Record Number: [id number] Patient Account Number: [id number] Date of Birth/Sex: Treating RN: May 13, 1946 (76 y.o. [name] Primary Care Aadil Sur: Deland Pretty Other Clinician: Referring Robynn Marcel: Treating Lennart Gladish/Extender: Raylene Everts in Treatment: 1 Active Inactive Wound/Skin Impairment Nursing Diagnoses: Impaired tissue integrity Goals: Patient/caregiver will verbalize understanding of skin care regimen Date Initiated: 05/19/2022 Target Resolution Date: 07/12/2022 Goal Status: Active Interventions: Assess ulceration(s) every visit Treatment Activities: Skin care regimen initiated : 05/19/2022 Notes: Electronic Signature(s) Signed: 05/26/2022 4:37:22 PM By: Dellie Catholic RN Entered By: Dellie Catholic on 05/26/2022  16:12:27 -------------------------------------------------------------------------------- Pain Assessment Details Patient Name: Date of Service: Destiny [name] 05/26/2022 2:30 PM Medical Record Number: [id number] Patient Account Number: [id number] Date of Birth/Sex: Treating RN: 04/12/1946 (76 y.o. [name] Primary Care Tranice Laduke: Deland Pretty Other Clinician: Referring Klaus Casteneda: Treating Abdikadir Fohl/Extender: Raylene Everts in Treatment: 1 Active Problems Location of Pain Severity and Description of Pain Patient Has Paino Yes Site Locations Pain Location: Pain Location: Generalized Pain With Dressing Change: No Duration of the Pain. Constant / Intermittento Constant Rate the pain. Current Pain Level: 3 Worst Pain Level: 10 Least Pain Level: 3 Tolerable Pain Level: 3 Character of Pain Describe the Pain: Burning Pain Management and Medication Current Pain Management: Medication: No Cold Application: No Rest: Yes Massage: No Activity: No T.E.N.S.: No Heat Application: No Leg drop or elevation: No Is the Current Pain Management Adequate: Adequate How does your wound impact your activities of daily livingo Sleep: No Bathing: No Appetite: No Relationship With Others: No Bladder Continence: No Emotions: No Bowel Continence: No Work: No Toileting: No Drive: No Dressing: No Hobbies: No Electronic Signature(s) Signed: 05/26/2022 4:37:22 PM By: Dellie Catholic RN Entered By: Dellie Catholic on 05/26/2022 14:57:18 -------------------------------------------------------------------------------- Patient/Caregiver Education Details Patient Name: Date of Service: Destiny [name] 8/14/2023andnbsp2:30 PM Medical Record Number: [id number] Patient Account Number: [id number] Date of Birth/Gender: Treating RN: February 08, 1946 (76 y.o. [name] Primary Care Physician: Deland Pretty Other Clinician: Referring Physician: Treating  Physician/Extender: Raylene Everts in Treatment: 1 Education Assessment Education Provided To: Patient Education Topics Provided Wound/Skin Impairment: Methods: Explain/Verbal Responses: Return demonstration correctly Electronic Signature(s) Signed: 05/26/2022 4:37:22 PM By: Dellie Catholic RN Entered By: Dellie Catholic on 05/26/2022 16:12:43 -------------------------------------------------------------------------------- Wound Assessment Details Patient Name: Date of Service: Destiny [name], Destiny [name] 05/26/2022 2:30 PM Medical Record Number: [id number] Patient Account Number: [id number] Date of Birth/Sex: Treating RN: 02-18-1946 (76 y.o. [name] Primary Care Yarianna Varble: Deland Pretty Other Clinician: Referring Kenya Kook: Treating Dayne Chait/Extender: Raylene Everts in Treatment: 1 Wound Status Wound Number: 1 Primary Etiology: Trauma, Other Wound Location: Left Calcaneus Wound Status: Open Wounding Event: Hematoma Comorbid History: Type II Diabetes, Osteoarthritis Date Acquired: 05/05/2022 Weeks Of Treatment: 1 Clustered Wound: No Photos Wound Measurements Length: (cm) 3 Width: (cm) 2.6 Depth: (cm) 0.1 Area: (cm) 6.126 Volume: (cm) 0.613 % Reduction in Area: 10.1% % Reduction in Volume: 55% Epithelialization: Small (1-33%) Tunneling: No Undermining: No Wound Description Classification: Full Thickness Without Exposed Support Structures Exudate Amount: Medium Exudate Type: Serosanguineous Exudate Color: red, brown  Foul Odor After Cleansing: No Slough/Fibrino Yes Wound Bed Granulation Amount: Large (67-100%) Exposed Structure Granulation Quality: Red, Hyper-granulation Fascia Exposed: No Necrotic Amount: Small (1-33%) Fat Layer (Subcutaneous Tissue) Exposed: Yes Necrotic Quality: Adherent Slough Tendon Exposed: No Muscle Exposed: No Joint Exposed: No Bone Exposed: No Treatment Notes Wound #1 (Calcaneus)  Wound Laterality: Left Cleanser Soap and Water Discharge Instruction: May shower and wash wound with dial antibacterial soap and water prior to dressing change. Wound Cleanser Discharge Instruction: Cleanse the wound with wound cleanser prior to applying a clean dressing using gauze sponges, not tissue or cotton balls. Peri-Wound Care Topical Primary Dressing Hydrofera Blue Ready Foam, 2.5 x2.5 in Discharge Instruction: Apply to wound bed as instructed Secondary Dressing Woven Gauze Sponge, Non-Sterile 4x4 in Discharge Instruction: Apply over primary dressing as directed. Secured With Elastic Bandage 4 inch (ACE bandage) Discharge Instruction: Secure with ACE bandage as directed. Kerlix Roll Sterile, 4.5x3.1 (in/yd) Discharge Instruction: Secure with Kerlix as directed. 56M Medipore Soft Cloth Surgical T 2x10 (in/yd) ape Discharge Instruction: Secure with tape as directed. Compression Wrap Compression Stockings Add-Ons Electronic Signature(s) Signed: 05/26/2022 4:37:22 PM By: Dellie Catholic RN Entered By: Dellie Catholic on 05/26/2022 15:09:54 -------------------------------------------------------------------------------- Vitals Details Patient Name: Date of Service: Destiny Dark F. 05/26/2022 2:30 PM Medical Record Number: [id number] Patient Account Number: [id number] Date of Birth/Sex: Treating RN: [birth year] (76 y.o. [name] Primary Care Shawndell Schillaci: Deland Pretty Other Clinician: Referring Randeep Biondolillo: Treating Saloma Cadena/Extender: Raylene Everts in Treatment: 1 Vital Signs Time Taken: 14:50 Temperature (F): 99.5 Height (in): 63 Pulse (bpm): 73 Source: Stated Respiratory Rate (breaths/min): 16 Blood Pressure (mmHg): 100/66 Reference Range: 80 - 120 mg / dl Electronic Signature(s) Signed: 05/26/2022 4:37:22 PM By: Dellie Catholic RN Entered By: Dellie Catholic on 05/26/2022 14:55:54

## 2022-05-30 NOTE — Progress Notes (Addendum)
[name], [name] ([id number]) Visit Report for 05/26/2022 Chief Complaint Document Details Patient Name: Date of Service: Destiny [name], Destiny [name] 05/26/2022 2:30 PM Medical Record Number: [id number] Patient Account Number: [id number] Date of Birth/Sex: Treating RN: November 02, 1945 (76 y.o. F) Primary Care Provider: Deland Pretty Other Clinician: Referring Provider: Treating Provider/Extender: Raylene Everts in Treatment: 1 Information Obtained from: Patient Chief Complaint Patient presents to the wound care center with open non-healing surgical wound(s) Electronic Signature(s) Signed: 05/26/2022 3:27:09 PM By: Fredirick Maudlin MD FACS Entered By: Fredirick Maudlin on 05/26/2022 15:27:09 -------------------------------------------------------------------------------- HPI Details Patient Name: Date of Service: Destiny Dark F. 05/26/2022 2:30 PM Medical Record Number: [id number] Patient Account Number: [id number] Date of Birth/Sex: Treating RN: 05/05/1946 (76 y.o. 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 After the final reexcision, the wound had an ACell graft applied. Destiny [name] reports that Destiny [name] had good closure of skin over the site. Earlier this year, at the end of May, however, Destiny [name] was diagnosed with rectal cancer and is currently undergoing neoadjuvant therapy (chemotherapy and radiation) in anticipation of future surgery. Destiny [name] reports that about 2 weeks ago, Destiny [name] noted that her skin coverage over the heel was beginning to break down. Her oncologist felt that perhaps the Xeloda was contributing to wound failure and stopped this medication. Destiny [name] continues to receive neoadjuvant radiation  therapy, however. Destiny [name] saw plastic surgery last week, and they referred her to the wound care center for further evaluation and management. Destiny [name] is a type II diabetic, well controlled, with the most recent A1c available to me being 5.9%. ABIs done 1 year ago were normal. On the patient's left heel, there is an irregular wound with some skin maceration. The fat layer is exposed and there is slough over the entire surface. Outside of the area of maceration, the skin is in good condition. No erythema, induration, or odor. No concern for infection. 05/26/2022: The wound is smaller today and is extremely clean, without any slough buildup. The periwound skin is in much better condition and is no longer macerated. Destiny [name] is scheduled to complete her radiation treatment on Wednesday. Electronic Signature(s) Signed: 05/26/2022 3:27:49 PM By: Fredirick Maudlin MD FACS Entered By: Fredirick Maudlin on 05/26/2022 15:27:49 -------------------------------------------------------------------------------- Physical Exam Details Patient Name: Date of Service: Destiny Dark F. 05/26/2022 2:30 PM Medical Record Number: [id number] Patient Account Number: [id number] Date of Birth/Sex: Treating RN: Mar 31, 1946 (76 y.o. F) Primary Care Provider: Deland Pretty Other Clinician: Referring Provider: Treating Provider/Extender: Raylene Everts in Treatment: 1 Constitutional . . . . No acute distress.Marland Kitchen Respiratory Normal work of breathing on room air.. Notes 05/26/2022: The wound is smaller today and is extremely clean, without any slough buildup. The periwound skin is in much better condition and is no longer macerated. Electronic Signature(s) Signed: 05/26/2022 3:28:42 PM By: Fredirick Maudlin MD FACS Entered By: Fredirick Maudlin on 05/26/2022 15:28:42 -------------------------------------------------------------------------------- Physician Orders Details Patient Name: Date of Service: Destiny Dark F. 05/26/2022 2:30 PM Medical Record Number: [id number] Patient Account Number: [id number] Date of Birth/Sex: Treating RN: 10-20-1945 (76 y.o. [name] Primary Care Provider: Deland Pretty Other Clinician: Referring Provider: Treating Provider/Extender: Raylene Everts in Treatment: 1 Verbal / Phone Orders: No Diagnosis Coding ICD-10 Coding Code Description [phone]  Non-pressure chronic ulcer of left heel and midfoot with fat layer exposed T81.31XS Disruption of external operation (surgical) wound, not elsewhere classified, sequela C43.9 Malignant melanoma of skin, unspecified C20 Malignant neoplasm of rectum E11.9 Type 2 diabetes mellitus without complications Q25.95 Personal history of antineoplastic chemotherapy Z92.3 Personal history of irradiation Follow-up Appointments ppointment in 1 week. - Dr Celine Ahr Room 3 on Tuesday 06/03/22 at 2;30pm Return A Bathing/ Shower/ Hygiene Other Bathing/Shower/Hygiene Orders/Instructions: - Change wound dressing after bathing Edema Control - Lymphedema / SCD / Other Avoid standing for long periods of time. Moisturize legs daily. Off-Loading Other: - Please wear off-loading shoe on left foot to keep pressure off the heel Wound Treatment Wound #1 - Calcaneus Wound Laterality: Left Cleanser: Soap and Water 1 x Per Day/30 Days Discharge Instructions: May shower and wash wound with dial antibacterial soap and water prior to dressing change. Cleanser: Wound Cleanser 1 x Per Day/30 Days Discharge Instructions: Cleanse the wound with wound cleanser prior to applying a clean dressing using gauze sponges, not tissue or cotton balls. Prim Dressing: Hydrofera Blue Ready Foam, 2.5 x2.5 in (Generic) 1 x Per Day/30 Days ary Discharge Instructions: Apply to wound bed as instructed Secondary Dressing: Woven Gauze Sponge, Non-Sterile 4x4 in 1 x Per Day/30 Days Discharge Instructions: Apply over primary dressing as  directed. Secured With: Elastic Bandage 4 inch (ACE bandage) 1 x Per Day/30 Days Discharge Instructions: Secure with ACE bandage as directed. Secured With: [employer], 4.5x3.1 (in/yd) 1 x Per Day/30 Days Discharge Instructions: Secure with Kerlix as directed. Secured With: 58M Medipore Public affairs consultant Surgical T 2x10 (in/yd) (Generic) 1 x Per Day/30 Days ape Discharge Instructions: Secure with tape as directed. Electronic Signature(s) Signed: 05/26/2022 3:30:27 PM By: Fredirick Maudlin MD FACS Entered By: Fredirick Maudlin on 05/26/2022 15:28:53 -------------------------------------------------------------------------------- Problem List Details Patient Name: Date of Service: Destiny Dark F. 05/26/2022 2:30 PM Medical Record Number: [id number] Patient Account Number: [id number] Date of Birth/Sex: Treating RN: 1946/07/31 (76 y.o. F) Primary Care Provider: Deland Pretty Other Clinician: Referring Provider: Treating Provider/Extender: Raylene Everts in Treatment: 1 Active Problems ICD-10 Encounter Code Description Active Date MDM Diagnosis [phone] Non-pressure chronic ulcer of left heel and midfoot with fat layer exposed 05/19/2022 No Yes T81.31XS Disruption of external operation (surgical) wound, not elsewhere classified, 05/19/2022 No Yes sequela C43.9 Malignant melanoma of skin, unspecified 05/19/2022 No Yes C20 Malignant neoplasm of rectum 05/19/2022 No Yes E11.9 Type 2 diabetes mellitus without complications 01/11/6605 No Yes Z92.21 Personal history of antineoplastic chemotherapy 05/19/2022 No Yes Z92.3 Personal history of irradiation 05/19/2022 No Yes Inactive Problems Resolved Problems Electronic Signature(s) Signed: 05/26/2022 3:27:00 PM By: Fredirick Maudlin MD FACS Entered By: Fredirick Maudlin on 05/26/2022 15:26:59 -------------------------------------------------------------------------------- Progress Note Details Patient Name: Date of Service: Destiny Dark F. 05/26/2022 2:30 PM Medical Record Number: [id number] Patient Account Number: [id number] Date of Birth/Sex: Treating RN: June 28, 1946 (76 y.o. 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 After the final reexcision, the wound had an ACell graft applied. Destiny [name] reports that Destiny [name] had good closure of skin over the site. Earlier this year, at the end of May, however, Destiny [name] was diagnosed with  rectal cancer and is currently undergoing neoadjuvant therapy (chemotherapy and radiation) in anticipation of future surgery. Destiny [name] reports that about 2 weeks ago, Destiny [name] noted that her skin coverage over the heel was beginning to break down. Her oncologist felt that perhaps the Xeloda was contributing to wound failure and stopped this medication. Destiny [name] continues to receive neoadjuvant radiation therapy, however. Destiny [name] saw plastic surgery last week, and they referred her to the wound care center for further evaluation and management. Destiny [name] is a type II diabetic, well controlled, with the most recent A1c available to me being 5.9%. ABIs done 1 year ago were normal. On the patient's left heel, there is an irregular wound with some skin maceration. The fat layer is exposed and there is slough over the entire surface. Outside of the area of maceration, the skin is in good condition. No erythema, induration, or odor. No concern for infection. 05/26/2022: The wound is smaller today and is extremely clean, without any slough buildup. The periwound skin is in much better condition and is no longer macerated. Destiny [name]  is scheduled to complete her radiation treatment on Wednesday. Patient History Information obtained from Patient. Social History Former smoker - 1991, Marital Status - Divorced, Alcohol Use - Never, Drug Use - No History, Caffeine Use - Moderate - Diet coke,coffee. Medical History Endocrine Patient has history of Type II Diabetes - Metformin pillsand Ozempic Musculoskeletal Patient has history of Osteoarthritis Hospitalization/Surgery History - Melanoma excision to left heel;ORIF Left ankle fracture;Marland Kitchen Medical A Surgical History Notes nd Eyes Macular Degeneration Ear/Nose/Mouth/Throat Tinnitus Oncologic 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 has had Chemotherapy (stopped) and Radiation -8 treatments left as of 05/19/22 Rectal cancer Psychiatric Hx Depression Objective Constitutional No acute distress.. Vitals Time Taken: 2:50 PM, Height: 63 in, Source: Stated, Temperature: 99.5 F, Pulse: 73 bpm, Respiratory Rate: 16 breaths/min, Blood Pressure: 100/66 mmHg. Respiratory Normal work of breathing on room air.. General Notes: 05/26/2022: The wound is smaller today and is extremely clean, without any slough buildup. The periwound skin is in much better condition and is no longer macerated. Integumentary (Hair, Skin) Wound #1 status is Open. Original cause of wound was Hematoma. The date acquired was: 05/05/2022. The wound has been in treatment 1 weeks. The wound is located on the Left Calcaneus. The wound measures 3cm length x 2.6cm width x 0.1cm depth; 6.126cm^2 area and 0.613cm^3 volume. There is Fat Layer (Subcutaneous Tissue) exposed. There is no tunneling or undermining noted. There is a medium amount of serosanguineous drainage noted. There is large (67- 100%) red, hyper - granulation within the wound bed. There is a small (1-33%) amount of necrotic tissue within the wound bed including Adherent Slough. Assessment Active Problems ICD-10 Non-pressure chronic ulcer of left heel and midfoot with fat  layer exposed Disruption of external operation (surgical) wound, not elsewhere classified, sequela Malignant melanoma of skin, unspecified Malignant neoplasm of rectum Type 2 diabetes mellitus without complications Personal history of antineoplastic chemotherapy Personal history of irradiation Plan Follow-up Appointments: Return Appointment in 1 week. - Dr Celine Ahr Room 3 on Tuesday 06/03/22 at 2;30pm Bathing/ Shower/ Hygiene: Other Bathing/Shower/Hygiene Orders/Instructions: - Change wound dressing after bathing Edema Control - Lymphedema / SCD / Other: Avoid standing for long periods of time. Moisturize legs daily. Off-Loading: Other: - Please wear off-loading shoe on left foot to keep pressure off the heel WOUND #1: - Calcaneus Wound Laterality: Left Cleanser: Soap and Water 1 x Per Day/30 Days Discharge Instructions: May shower and wash wound with dial antibacterial soap and  water prior to dressing change. Cleanser: Wound Cleanser 1 x Per Day/30 Days Discharge Instructions: Cleanse the wound with wound cleanser prior to applying a clean dressing using gauze sponges, not tissue or cotton balls. Prim Dressing: Hydrofera Blue Ready Foam, 2.5 x2.5 in (Generic) 1 x Per Day/30 Days ary Discharge Instructions: Apply to wound bed as instructed Secondary Dressing: Woven Gauze Sponge, Non-Sterile 4x4 in 1 x Per Day/30 Days Discharge Instructions: Apply over primary dressing as directed. Secured With: Elastic Bandage 4 inch (ACE bandage) 1 x Per Day/30 Days Discharge Instructions: Secure with ACE bandage as directed. Secured With: [employer], 4.5x3.1 (in/yd) 1 x Per Day/30 Days Discharge Instructions: Secure with Kerlix as directed. Secured With: 84M Medipore Public affairs consultant Surgical T 2x10 (in/yd) (Generic) 1 x Per Day/30 Days ape Discharge Instructions: Secure with tape as directed. 05/26/2022: The wound is smaller today and is extremely clean, without any slough buildup. The periwound  skin is in much better condition and is no longer macerated. No debridement was necessary today. I do think cessation of Xeloda has contributed to the improvement in her wound healing. We will continue using the [facility] ready foam with Kerlix and Ace wrapping. Follow-up in 1 week. Electronic Signature(s) Signed: 05/26/2022 3:29:29 PM By: Fredirick Maudlin MD FACS Entered By: Fredirick Maudlin on 05/26/2022 15:29:28 -------------------------------------------------------------------------------- HxROS Details Patient Name: Date of Service: Destiny Dark F. 05/26/2022 2:30 PM Medical Record Number: [id number] Patient Account Number: [id number] Date of Birth/Sex: Treating RN: 1945/11/25 (75 y.o. F) Primary Care Provider: Deland Pretty Other Clinician: Referring Provider: Treating Provider/Extender: Raylene Everts in Treatment: 1 Information Obtained From Patient Eyes Medical History: Past Medical History Notes: Macular Degeneration Ear/Nose/Mouth/Throat Medical History: Past Medical History Notes: Tinnitus Endocrine Medical History: Positive for: Type II Diabetes - Metformin pillsand Ozempic Time with diabetes: 25 years Treated with: Oral agents Blood sugar tested every day: Yes Tested : every day Musculoskeletal Medical History: Positive for: Osteoarthritis Oncologic Medical History: Past Medical History Notes: 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 has had Chemotherapy (stopped) and Radiation -8 treatments left as of 05/19/22 Rectal cancer Psychiatric Medical History: Past Medical History Notes: Hx Depression Immunizations Pneumococcal Vaccine: Received Pneumococcal Vaccination: Yes Received Pneumococcal Vaccination On or After 60th Birthday: Yes Implantable Devices None Hospitalization / Surgery History Type of Hospitalization/Surgery Melanoma excision to left heel;ORIF Left ankle fracture; Family and Social History Former smoker - 1991; Marital  Status - Divorced; Alcohol Use: Never; Drug Use: No History; Caffeine Use: Moderate - Diet coke,coffee; Financial Concerns: No; Food, Clothing or Shelter Needs: No; Support System Lacking: No; Transportation Concerns: No Electronic Signature(s) Signed: 05/26/2022 3:30:27 PM By: Fredirick Maudlin MD FACS Entered By: Fredirick Maudlin on 05/26/2022 15:28:13 -------------------------------------------------------------------------------- SuperBill Details Patient Name: Date of Service: [name] 05/26/2022 Medical Record Number: [id number] Patient Account Number: [id number] Date of Birth/Sex: Treating RN: Oct 20, 1945 (76 y.o. F) Primary Care Provider: Deland Pretty Other Clinician: Referring Provider: Treating Provider/Extender: Raylene Everts in Treatment: 1 Diagnosis Coding ICD-10 Codes Code Description [phone] Non-pressure chronic ulcer of left heel and midfoot with fat layer exposed T81.31XS Disruption of external operation (surgical) wound, not elsewhere classified, sequela C43.9 Malignant melanoma of skin, unspecified C20 Malignant neoplasm of rectum E11.9 Type 2 diabetes mellitus without complications X44.81 Personal history of antineoplastic chemotherapy Z92.3 Personal history of irradiation Facility Procedures CPT4 Code: [id number] Description: 02637 - WOUND CARE VISIT-LEV 3 EST PT Modifier: Quantity: 1 Physician Procedures : CPT4 Code Description Modifier [id number] -  WC PHYS LEVEL 3 - EST PT ICD-10 Diagnosis Description L97.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 T81.31XS Disruption of external operation (surgical) wound, not  elsewhere classified, sequela C43.9 Malignant melanoma of skin, unspecified Z92.21 Personal history of antineoplastic chemotherapy Quantity: 1 Electronic Signature(s) Signed: 05/26/2022 4:37:22 PM By: Dellie Catholic RN Signed: 05/27/2022 7:30:40 AM By: Fredirick Maudlin MD  FACS Previous Signature: 05/26/2022 3:29:47 PM Version By: Fredirick Maudlin MD FACS Entered By: Dellie Catholic on 05/26/2022 16:36:17

## 2022-06-02 ENCOUNTER — Ambulatory Visit: Payer: Self-pay | Admitting: General Surgery

## 2022-06-02 DIAGNOSIS — C20 Malignant neoplasm of rectum: Secondary | ICD-10-CM | POA: Diagnosis not present

## 2022-06-02 NOTE — H&P (Signed)
PROVIDER:  Monico Blitz, MD  MRN: [id number] DOB: 1945-10-16 DATE OF ENCOUNTER: 06/02/2022  Subjective   Chief Complaint: No chief complaint on file.     History of Present Illness:   76 year old female who was noted to have a positive Cologuard test.  She underwent a colonoscopy and was noted to have a proximal rectal mass approximately 12 cm from the anal verge.  Biopsies show adenocarcinoma.  CT scan of the chest abdomen pelvis shows no obvious signs of metastatic disease.  Patient reports a history of constipation.  She denies any difficulty with incontinence.  Surgical history significant for hysterectomy due to infection and lap band surgery.  Review of Systems: A complete review of systems was obtained from the patient.  I have reviewed this information and discussed as appropriate with the patient.  See HPI as well for other ROS.   Medical History: Past Medical History:  Diagnosis Date   Diabetes mellitus without complication (CMS-HCC)    History of cancer    Sleep apnea    Thyroid disease     Patient Active Problem List  Diagnosis   Malignant melanoma of skin of left heel (CMS-HCC)    Past Surgical History:  Procedure Laterality Date   Melanoma Left Heel Excision Left 03/26/2021     Allergies  Allergen Reactions   Penicillins Anaphylaxis and Swelling    Has patient had a PCN reaction causing immediate rash, facial/tongue/throat swelling, SOB or lightheadedness with hypotension: Yes Has patient had a PCN reaction causing severe rash involving mucus membranes or skin necrosis: unknown Has patient had a PCN reaction that required hospitalization: No Has patient had a PCN reaction occurring within the last 10 years: No If all of the above answers are "NO", then may proceed with Cephalosporin use.    Aspirin Other (See Comments)    Stomach upset   Atorvastatin Other (See Comments)    cramps    Fentanyl Other (See Comments) and Nausea   Lisinopril  Other (See Comments)    dizziness    Propofol Other (See Comments)   Rosuvastatin Other (See Comments)   Hydrocodone Other (See Comments)    "Does not like how it makes me feel    Current Outpatient Medications on File Prior to Visit  Medication Sig Dispense Refill   acetaminophen (TYLENOL) 500 MG tablet Take by mouth     aspirin 81 MG EC tablet Take by mouth (Patient not taking: Reported on 03/24/2022)     bisacodyL (DULCOLAX) 5 mg EC tablet Take 4 tablets (20 mg total) by mouth once daily as needed for Constipation for up to 1 dose 4 tablet 0   cholecalciferol (VITAMIN D3) 2,000 unit tablet Take by mouth     cyanocobalamin (VITAMIN B12) 1000 MCG tablet Take by mouth (Patient not taking: Reported on 03/24/2022)     denosumab (PROLIA) 60 mg/mL inj syringe 60 mg     ezetimibe (ZETIA) 10 mg tablet      levothyroxine (SYNTHROID) 100 MCG tablet TAKE 1 TABLET BY MOUTH EVERY MORNING ON EMPTY STOMACH     metFORMIN (GLUCOPHAGE-XR) 500 MG XR tablet 2 tablets     ondansetron (ZOFRAN) 4 MG tablet Take one tablet 30 minutes before take colonoscopy prep.     pravastatin (PRAVACHOL) 20 MG tablet Take 1 tablet by mouth every evening     semaglutide (OZEMPIC) 1 mg/dose (4 mg/3 mL) PnIj 2 mg     sodium, potassium, and magnesium (SUPREP) oral solution as directed  traMADoL (ULTRAM) 50 mg tablet TAKE 1 TABLET BY MOUTH EVERY 8 (EIGHT) HOURS AS NEEDED FOR UP TO 5 DAYS FOR SEVERE PAIN. (Patient not taking: Reported on 03/24/2022)     No current facility-administered medications on file prior to visit.    Family History  Problem Relation Age of Onset   Obesity Mother    Hyperlipidemia (Elevated cholesterol) Mother    Diabetes Mother    High blood pressure (Hypertension) Sister    Hyperlipidemia (Elevated cholesterol) Sister    Diabetes Brother      Social History   Tobacco Use  Smoking Status Former   Types: Cigarettes   Quit date: 1990   Years since quitting: 33.6  Smokeless Tobacco Never      Social History   Socioeconomic History   Marital status: Divorced  Tobacco Use   Smoking status: Former    Types: Cigarettes    Quit date: 1990    Years since quitting: 33.6   Smokeless tobacco: Never  Substance and Sexual Activity   Alcohol use: Yes    Alcohol/week: 1.0 standard drink    Types: 1 Standard drinks or equivalent per week   Drug use: Never    Objective:    There were no vitals filed for this visit.   Exam Gen: NAD CV: RRR Lungs: CTA Abd: soft    Labs, Imaging and Diagnostic Testing: CEA 3.5 MRI completed June 2023 shows T3b disease with early EMVI, ~7-9cm from the sphincters CT scan of the chest abdomen pelvis completed in June 2023 shows no obvious metastatic disease in the chest abdomen and pelvis with small pulmonary nodules noted with calcifications likely to be granulomas. Assessment and Plan:   76 year old female who presented to the office for evaluation of a stage II, possible stage III rectal cancer approximately 12 cm from the anal verge (tattooed).   She underwent chemotherapy and radiation.  Final radiation treatment was May 28, 2022.  I have recommended low anterior resection in early November.  We discussed there is a small chance she would need a diverting ileostomy but I think this is less than 10%.  The surgery and anatomy were described to the patient as well as the risks of surgery and the possible complications.  These include: Bleeding, deep abdominal infections and possible wound complications such as hernia and infection, damage to adjacent structures, leak of surgical connections, which can lead to other surgeries and possibly an ostomy, possible need for other procedures, such as abscess drains in radiology, possible prolonged hospital stay, possible diarrhea from removal of part of the colon, possible constipation from narcotics, possible bowel, bladder or sexual dysfunction if having rectal surgery, prolonged fatigue/weakness or  appetite loss, possible early recurrence of of disease, possible complications of their medical problems such as heart disease or arrhythmias or lung problems, death (less than 1%). I believe the patient understands and wishes to proceed with the surgery.   No follow-ups on file.   Rosario Adie, MD Colon and Rectal Surgery [facility]

## 2022-06-03 ENCOUNTER — Encounter (HOSPITAL_BASED_OUTPATIENT_CLINIC_OR_DEPARTMENT_OTHER): Payer: PPO | Admitting: General Surgery

## 2022-06-03 DIAGNOSIS — E11621 Type 2 diabetes mellitus with foot ulcer: Secondary | ICD-10-CM | POA: Diagnosis not present

## 2022-06-03 DIAGNOSIS — S91302A Unspecified open wound, left foot, initial encounter: Secondary | ICD-10-CM | POA: Diagnosis not present

## 2022-06-03 NOTE — Progress Notes (Signed)
SHAUNIECE, KWAN ([id number]) Visit Report for 06/03/2022 Arrival Information Details Patient Name: Date of Service: VELEDA, MUN 06/03/2022 10:30 A M Medical Record Number: [id number] Patient Account Number: [id number] Date of Birth/Sex: Treating RN: December 16, 1945 (76 y.o. Martyn Malay, Linda Primary Care Asmara Backs: Deland Pretty Other Clinician: Referring Yulisa Chirico: Treating Xayden Linsey/Extender: Raylene Everts in Treatment: 2 Visit Information History Since Last Visit Added or deleted any medications: No Patient Arrived: Ambulatory Any new allergies or adverse reactions: No Arrival Time: 11:03 Had a fall or experienced change in No Accompanied By: self activities of daily living that may affect Transfer Assistance: None risk of falls: Patient Identification Verified: Yes Signs or symptoms of abuse/neglect since last visito No Secondary Verification Process Completed: Yes Hospitalized since last visit: No Patient Requires Transmission-Based Precautions: No Implantable device outside of the clinic excluding No Patient Has Alerts: No cellular tissue based products placed in the center since last visit: Has Dressing in Place as Prescribed: Yes Pain Present Now: No Electronic Signature(s) Signed: 06/03/2022 5:37:55 PM By: Baruch Gouty RN, BSN Entered By: Baruch Gouty on 06/03/2022 11:06:29 -------------------------------------------------------------------------------- Encounter Discharge Information Details Patient Name: Date of Service: [name] 06/03/2022 10:30 A M Medical Record Number: [id number] Patient Account Number: [id number] Date of Birth/Sex: Treating RN: [birth year] (76 y.o. Elam Dutch Primary Care Capers Hagmann: Deland Pretty Other Clinician: Referring Islah Eve: Treating Ramon Zanders/Extender: Raylene Everts in Treatment: 2 Encounter Discharge Information Items Post Procedure Vitals Discharge Condition:  Stable Temperature (F): 97.9 Ambulatory Status: Ambulatory Pulse (bpm): 79 Discharge Destination: Home Respiratory Rate (breaths/min): 18 Transportation: Private Auto Blood Pressure (mmHg): 146/79 Accompanied By: self Schedule Follow-up Appointment: Yes Clinical Summary of Care: Patient Declined Electronic Signature(s) Signed: 06/03/2022 5:37:55 PM By: Baruch Gouty RN, BSN Entered By: Baruch Gouty on 06/03/2022 14:04:01 -------------------------------------------------------------------------------- Lower Extremity Assessment Details Patient Name: Date of Service: [name] 06/03/2022 10:30 A M Medical Record Number: [id number] Patient Account Number: [id number] Date of Birth/Sex: Treating RN: [birth year] (76 y.o. Elam Dutch Primary Care Ksean Vale: Deland Pretty Other Clinician: Referring Barrett Holthaus: Treating Wayland Baik/Extender: Raylene Everts in Treatment: 2 Edema Assessment Assessed: [Left: No] [Right: No] Edema: [Left: N] [Right: o] Calf Left: Right: Point of Measurement: 29 cm From Medial Instep 35.7 cm Ankle Left: Right: Point of Measurement: 10 cm From Medial Instep 21.6 cm Vascular Assessment Pulses: Dorsalis Pedis Palpable: [Left:Yes] Electronic Signature(s) Signed: 06/03/2022 5:37:55 PM By: Baruch Gouty RN, BSN Entered By: Baruch Gouty on 06/03/2022 11:08:44 -------------------------------------------------------------------------------- Multi Wound Chart Details Patient Name: Date of Service: [name] 06/03/2022 10:30 A M Medical Record Number: [id number] Patient Account Number: [id number] Date of Birth/Sex: Treating RN: 12-Dec-1945 (76 y.o. Elam Dutch Primary Care Christophe Rising: Deland Pretty Other Clinician: Referring Aurea Aronov: Treating Tawanna Funk/Extender: Raylene Everts in Treatment: 2 Vital Signs Height(in): 63 Pulse(bpm): 71 Weight(lbs): Blood Pressure(mmHg):  146/79 Body Mass Index(BMI): Temperature(F): 97.9 Respiratory Rate(breaths/min): 18 Photos: [1:Left Calcaneus] [N/A:N/A N/A] Wound Location: [1:Hematoma] [N/A:N/A] Wounding Event: [1:Trauma, Other] [N/A:N/A] Primary Etiology: [1:Type II Diabetes, Osteoarthritis] [N/A:N/A] Comorbid History: [1:05/05/2022] [N/A:N/A] Date Acquired: [1:2] [N/A:N/A] Weeks of Treatment: [1:Open] [N/A:N/A] Wound Status: [1:No] [N/A:N/A] Wound Recurrence: [1:2.7x2.2x0.1] [N/A:N/A] Measurements L x W x D (cm) [1:4.665] [N/A:N/A] A (cm) : rea [1:0.467] [N/A:N/A] Volume (cm) : [1:31.60%] [N/A:N/A] % Reduction in A rea: [1:65.70%] [N/A:N/A] % Reduction in Volume: [1:Full Thickness Without Exposed] [N/A:N/A] Classification: [1:Support Structures Medium] [N/A:N/A] Exudate A mount: [1:Serosanguineous] [N/A:N/A] Exudate Type: [1:red, brown] [N/A:N/A] Exudate  Color: [1:Flat and Intact] [N/A:N/A] Wound Margin: [1:Large (67-100%)] [N/A:N/A] Granulation A mount: [1:Red, Friable] [N/A:N/A] Granulation Quality: [1:None Present (0%)] [N/A:N/A] Necrotic A mount: [1:Fat Layer (Subcutaneous Tissue): Yes N/A] Exposed Structures: [1:Fascia: No Tendon: No Muscle: No Joint: No Bone: No Small (1-33%)] [N/A:N/A] Epithelialization: [1:Debridement - Selective/Open Wound N/A] Debridement: Pre-procedure Verification/Time Out 11:20 [N/A:N/A] Taken: [1:Lidocaine 4% Topical Solution] [N/A:N/A] Pain Control: [1:Necrotic/Eschar, Callus] [N/A:N/A] Tissue Debrided: [1:Non-Viable Tissue] [N/A:N/A] Level: [1:5.94] [N/A:N/A] Debridement A (sq cm): [1:rea Curette] [N/A:N/A] Instrument: [1:Minimum] [N/A:N/A] Bleeding: [1:Pressure] [N/A:N/A] Hemostasis A chieved: [1:0] [N/A:N/A] Procedural Pain: [1:0] [N/A:N/A] Post Procedural Pain: [1:Procedure was tolerated well] [N/A:N/A] Debridement Treatment Response: [1:2.7x2.2x0.1] [N/A:N/A] Post Debridement Measurements L x W x D (cm) [1:0.467] [N/A:N/A] Post Debridement Volume: (cm)  [1:Debridement] [N/A:N/A] Treatment Notes Electronic Signature(s) Signed: 06/03/2022 11:25:14 AM By: Fredirick Maudlin MD FACS Signed: 06/03/2022 5:37:55 PM By: Baruch Gouty RN, BSN Entered By: Fredirick Maudlin on 06/03/2022 11:25:14 -------------------------------------------------------------------------------- Multi-Disciplinary Care Plan Details Patient Name: Date of Service: SARAJEAN, DESSERT 06/03/2022 10:30 A M Medical Record Number: [id number] Patient Account Number: [id number] Date of Birth/Sex: Treating RN: 1946-04-30 (76 y.o. Elam Dutch Primary Care Ingris Pasquarella: Deland Pretty Other Clinician: Referring Harlen Danford: Treating Kathlyne Loud/Extender: Raylene Everts in Treatment: 2 [place] reviewed with physician Active Inactive Wound/Skin Impairment Nursing Diagnoses: Impaired tissue integrity Goals: Patient/caregiver will verbalize understanding of skin care regimen Date Initiated: 05/19/2022 Target Resolution Date: 07/12/2022 Goal Status: Active Interventions: Assess ulceration(s) every visit Treatment Activities: Skin care regimen initiated : 05/19/2022 Notes: Electronic Signature(s) Signed: 06/03/2022 5:37:55 PM By: Baruch Gouty RN, BSN Entered By: Baruch Gouty on 06/03/2022 11:17:23 -------------------------------------------------------------------------------- Pain Assessment Details Patient Name: Date of Service: [name] 06/03/2022 10:30 A M Medical Record Number: [id number] Patient Account Number: [id number] Date of Birth/Sex: Treating RN: [birth year] (76 y.o. Elam Dutch Primary Care [place]: Deland Pretty Other Clinician: Referring Oluwatosin Higginson: Treating Adebayo Ensminger/Extender: Raylene Everts in Treatment: 2 Active Problems Location of Pain Severity and Description of Pain Patient Has Paino No Site Locations Rate the pain. Current Pain Level: 0 Character of Pain Describe  the Pain: Tender Pain Management and Medication Current Pain Management: Electronic Signature(s) Signed: 06/03/2022 5:37:55 PM By: Baruch Gouty RN, BSN Entered By: Baruch Gouty on 06/03/2022 11:08:11 -------------------------------------------------------------------------------- Patient/Caregiver Education Details Patient Name: Date of Service: [name] 8/22/2023andnbsp10:30 A M Medical Record Number: [id number] Patient Account Number: [id number] Date of Birth/Gender: Treating RN: 06-Sep-1946 (76 y.o. Elam Dutch Primary Care Physician: Deland Pretty Other Clinician: Referring Physician: Treating Physician/Extender: Raylene Everts in Treatment: 2 Education Assessment Education Provided To: Patient Education Topics Provided Wound/Skin Impairment: Methods: Explain/Verbal Responses: Reinforcements needed, State content correctly Electronic Signature(s) Signed: 06/03/2022 5:37:55 PM By: Baruch Gouty RN, BSN Entered By: Baruch Gouty on 06/03/2022 11:17:43 -------------------------------------------------------------------------------- Wound Assessment Details Patient Name: Date of Service: [name] 06/03/2022 10:30 A M Medical Record Number: [id number] Patient Account Number: [id number] Date of Birth/Sex: Treating RN: 1946/09/14 (76 y.o. Elam Dutch Primary Care Shakiera Edelson: Deland Pretty Other Clinician: Referring Jakeob Tullis: Treating Pennye Beeghly/Extender: Raylene Everts in Treatment: 2 Wound Status Wound Number: 1 Primary Etiology: Trauma, Other Wound Location: Left Calcaneus Wound Status: Open Wounding Event: Hematoma Comorbid History: Type II Diabetes, Osteoarthritis Date Acquired: 05/05/2022 Weeks Of Treatment: 2 Clustered Wound: No Photos Wound Measurements Length: (cm) 2.7 Width: (cm) 2.2 Depth: (cm) 0.1 Area: (cm) 4.665 Volume: (cm) 0.467 Wound Description Classification:  Full Thickness Without Exposed Support  Structu Wound Margin: Flat and Intact Exudate Amount: Medium Exudate Type: Serosanguineous Exudate Color: red, brown Foul Odor After Cleansing: Slough/Fibrino % Reduction in Area: 31.6% % Reduction in Volume: 65.7% Epithelialization: Small (1-33%) Tunneling: No Undermining: No res No No Wound Bed Granulation Amount: Large (67-100%) Exposed Structure Granulation Quality: Red, Friable Fascia Exposed: No Necrotic Amount: None Present (0%) Fat Layer (Subcutaneous Tissue) Exposed: Yes Tendon Exposed: No Muscle Exposed: No Joint Exposed: No Bone Exposed: No Treatment Notes Wound #1 (Calcaneus) Wound Laterality: Left Cleanser Soap and Water Discharge Instruction: May shower and wash wound with dial antibacterial soap and water prior to dressing change. Wound Cleanser Discharge Instruction: Cleanse the wound with wound cleanser prior to applying a clean dressing using gauze sponges, not tissue or cotton balls. Peri-Wound Care Topical Primary Dressing Hydrofera Blue Ready Foam, 2.5 x2.5 in Discharge Instruction: Apply to wound bed as instructed Secondary Dressing Woven Gauze Sponge, Non-Sterile 4x4 in Discharge Instruction: Apply over primary dressing as directed. Secured With Elastic Bandage 4 inch (ACE bandage) Discharge Instruction: Secure with ACE bandage as directed. Kerlix Roll Sterile, 4.5x3.1 (in/yd) Discharge Instruction: Secure with Kerlix as directed. 37M Medipore Soft Cloth Surgical T 2x10 (in/yd) ape Discharge Instruction: Secure with tape as directed. Compression Wrap Compression Stockings Add-Ons Electronic Signature(s) Signed: 06/03/2022 5:37:55 PM By: Baruch Gouty RN, BSN Entered By: Baruch Gouty on 06/03/2022 11:14:13 -------------------------------------------------------------------------------- Vitals Details Patient Name: Date of Service: [name] 06/03/2022 10:30 A M Medical Record Number:  [id number] Patient Account Number: [id number] Date of Birth/Sex: Treating RN: [birth year] (76 y.o. Elam Dutch Primary Care Ewelina Naves: Deland Pretty Other Clinician: Referring Gaylon Bentz: Treating Aime Meloche/Extender: Raylene Everts in Treatment: 2 Vital Signs Time Taken: 11:07 Temperature (F): 97.9 Height (in): 63 Pulse (bpm): 79 Respiratory Rate (breaths/min): 18 Blood Pressure (mmHg): 146/79 Reference Range: 80 - 120 mg / dl Electronic Signature(s) Signed: 06/03/2022 5:37:55 PM By: Baruch Gouty RN, BSN Entered By: Baruch Gouty on 06/03/2022 11:31:11

## 2022-06-03 NOTE — Progress Notes (Signed)
Destiny [name], Destiny [name] ([id number]) Visit Report for 06/03/2022 Chief Complaint Document Details Patient Name: Date of Service: Destiny [name], Destiny [name] 06/03/2022 10:30 A M Medical Record Number: [id number] Patient Account Number: [id number] Date of Birth/Sex: Treating RN: Mar 01, 1946 (76 y.o. Destiny [name] Primary Care Provider: Deland Pretty Other Clinician: Referring Provider: Treating Provider/Extender: Raylene Everts in Treatment: 2 Information Obtained from: Patient Chief Complaint Patient presents to the wound care center with open non-healing surgical wound(s) Electronic Signature(s) Signed: 06/03/2022 11:25:19 AM By: Fredirick Maudlin MD FACS Entered By: Fredirick Maudlin on 06/03/2022 11:25:19 -------------------------------------------------------------------------------- Debridement Details Patient Name: Date of Service: Destiny Dark F. 06/03/2022 10:30 A M Medical Record Number: [id number] Patient Account Number: [id number] Date of Birth/Sex: Treating RN: 28-Jan-1946 (76 y.o. Destiny [name] Primary Care Provider: Deland Pretty Other Clinician: Referring Provider: Treating Provider/Extender: Raylene Everts in Treatment: 2 Debridement Performed for Assessment: Wound #1 Left Calcaneus Performed By: Physician Fredirick Maudlin, MD Debridement Type: Debridement Level of Consciousness (Pre-procedure): Awake and Alert Pre-procedure Verification/Time Out Yes - 11:20 Taken: Start Time: 11:20 Pain Control: Lidocaine 4% T opical Solution T Area Debrided (L x W): otal 2.7 (cm) x 2.2 (cm) = 5.94 (cm) Tissue and other material debrided: Non-Viable, Callus, Eschar Level: Non-Viable Tissue Debridement Description: Selective/Open Wound Instrument: Curette Bleeding: Minimum Hemostasis Achieved: Pressure Procedural Pain: 0 Post Procedural Pain: 0 Response to Treatment: Procedure was tolerated well Level of Consciousness (Post- Awake and  Alert procedure): Post Debridement Measurements of Total Wound Length: (cm) 2.7 Width: (cm) 2.2 Depth: (cm) 0.1 Volume: (cm) 0.467 Character of Wound/Ulcer Post Debridement: Improved Post Procedure Diagnosis Same as Pre-procedure Electronic Signature(s) Signed: 06/03/2022 12:10:29 PM By: Fredirick Maudlin MD FACS Signed: 06/03/2022 5:37:55 PM By: Baruch Gouty RN, BSN Entered By: Baruch Gouty on 06/03/2022 11:22:03 -------------------------------------------------------------------------------- HPI Details Patient Name: Date of Service: Destiny Dark F. 06/03/2022 10:30 A M Medical Record Number: [id number] Patient Account Number: [id number] Date of Birth/Sex: Treating RN: 02-25-1946 (76 y.o. 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 After the final reexcision, the wound had an ACell graft applied. She reports that she had good closure of skin over the site. Earlier this year, at the end of May, however, she was diagnosed with rectal cancer and is currently undergoing neoadjuvant therapy (chemotherapy and radiation) in anticipation of future surgery. She reports that about 2 weeks ago, she noted that her skin coverage over the heel was beginning to break down. Her oncologist felt that perhaps the Xeloda was contributing to wound failure and stopped this medication. She continues to receive neoadjuvant radiation therapy, however. She saw plastic surgery last week, and they referred her to the wound care center for further evaluation and management. She is a type II diabetic, well controlled, with the most recent A1c available to me being 5.9%. ABIs done 1 year ago were  normal. On the patient's left heel, there is an irregular wound with some skin maceration. The fat layer is exposed and there is slough over the entire surface. Outside of the area of maceration, the skin is in good condition. No erythema, induration, or odor. No concern for infection. 05/26/2022: The wound is smaller today and is extremely clean, without any slough buildup. The periwound skin is  in much better condition and is no longer macerated. She is scheduled to complete her radiation treatment on Wednesday. 06/03/2022: The wound continues to contract. There is just a little bit of eschar and callus accumulation. The surface is clean. She has completed all of her neoadjuvant therapy and is awaiting surgery in November. Electronic Signature(s) Signed: 06/03/2022 11:25:58 AM By: Fredirick Maudlin MD FACS Entered By: Fredirick Maudlin on 06/03/2022 11:25:58 -------------------------------------------------------------------------------- Physical Exam Details Patient Name: Date of Service: Destiny Dark F. 06/03/2022 10:30 A M Medical Record Number: [id number] Patient Account Number: [id number] Date of Birth/Sex: Treating RN: 1946-04-01 (76 y.o. Destiny [name] Primary Care Provider: Deland Pretty Other Clinician: Referring Provider: Treating Provider/Extender: Raylene Everts in Treatment: 2 Constitutional Slightly hypertensive. . . . No acute distress.Marland Kitchen Respiratory Normal work of breathing on room air.. Notes 06/03/2022: The wound continues to contract. There is just a little bit of eschar and callus accumulation. The surface is clean. Electronic Signature(s) Signed: 06/03/2022 11:26:27 AM By: Fredirick Maudlin MD FACS Entered By: Fredirick Maudlin on 06/03/2022 11:26:26 -------------------------------------------------------------------------------- Physician Orders Details Patient Name: Date of Service: Destiny Dark F. 06/03/2022 10:30 A M Medical Record  Number: [id number] Patient Account Number: [id number] Date of Birth/Sex: Treating RN: 1945-12-22 (76 y.o. Destiny [name] Primary Care Provider: Deland Pretty Other Clinician: Referring Provider: Treating Provider/Extender: Raylene Everts in Treatment: 2 Verbal / Phone Orders: No Diagnosis Coding ICD-10 Coding Code Description [phone] Non-pressure chronic ulcer of left heel and midfoot with fat layer exposed T81.31XS Disruption of external operation (surgical) wound, not elsewhere classified, sequela C43.9 Malignant melanoma of skin, unspecified C20 Malignant neoplasm of rectum E11.9 Type 2 diabetes mellitus without complications T70.01 Personal history of antineoplastic chemotherapy Z92.3 Personal history of irradiation Follow-up Appointments ppointment in 1 week. - Dr Celine Ahr Room 1 Return A Tuesday 06/10/22 at 2:00 pm Bathing/ Shower/ Hygiene Other Bathing/Shower/Hygiene Orders/Instructions: - Change wound dressing after bathing Edema Control - Lymphedema / SCD / Other Avoid standing for long periods of time. Moisturize legs daily. Off-Loading Other: - Please wear off-loading shoe on left foot to keep pressure off the heel Wound Treatment Wound #1 - Calcaneus Wound Laterality: Left Cleanser: Soap and Water 1 x Per Day/30 Days Discharge Instructions: May shower and wash wound with dial antibacterial soap and water prior to dressing change. Cleanser: Wound Cleanser 1 x Per Day/30 Days Discharge Instructions: Cleanse the wound with wound cleanser prior to applying a clean dressing using gauze sponges, not tissue or cotton balls. Prim Dressing: Hydrofera Blue Ready Foam, 2.5 x2.5 in (Generic) 1 x Per Day/30 Days ary Discharge Instructions: Apply to wound bed as instructed Secondary Dressing: Woven Gauze Sponge, Non-Sterile 4x4 in 1 x Per Day/30 Days Discharge Instructions: Apply over primary dressing as directed. Secured With: Elastic Bandage 4 inch (ACE  bandage) 1 x Per Day/30 Days Discharge Instructions: Secure with ACE bandage as directed. Secured With: [employer], 4.5x3.1 (in/yd) 1 x Per Day/30 Days Discharge Instructions: Secure with Kerlix as directed. Secured With: 57M Medipore Public affairs consultant Surgical T 2x10 (in/yd) (Generic) 1 x Per Day/30 Days ape Discharge Instructions: Secure with tape as directed. Patient Medications llergies: propofol, aspirin, atorvastatin, Lisinopril, hydrocodone, penicillin A Notifications Medication Indication Start End prior to debridement 06/03/2022 lidocaine DOSE topical 4 % cream - cream topical Electronic Signature(s) Signed: 06/03/2022 12:10:29 PM By: Fredirick Maudlin MD FACS Entered By: Fredirick Maudlin on 06/03/2022 11:28:26 -------------------------------------------------------------------------------- Problem List Details Patient Name: Date of Service: Mirna Mires, LO  Angelita Ingles 06/03/2022 10:30 A M Medical Record Number: [id number] Patient Account Number: [id number] Date of Birth/Sex: Treating RN: February 10, 1946 (76 y.o. Destiny [name] Primary Care Provider: Deland Pretty Other Clinician: Referring Provider: Treating Provider/Extender: Raylene Everts in Treatment: 2 Active Problems ICD-10 Encounter Code Description Active Date MDM Diagnosis [phone] Non-pressure chronic ulcer of left heel and midfoot with fat layer exposed 05/19/2022 No Yes T81.31XS Disruption of external operation (surgical) wound, not elsewhere classified, 05/19/2022 No Yes sequela C43.9 Malignant melanoma of skin, unspecified 05/19/2022 No Yes C20 Malignant neoplasm of rectum 05/19/2022 No Yes E11.9 Type 2 diabetes mellitus without complications 02/10/7000 No Yes Z92.21 Personal history of antineoplastic chemotherapy 05/19/2022 No Yes Z92.3 Personal history of irradiation 05/19/2022 No Yes Inactive Problems Resolved Problems Electronic Signature(s) Signed: 06/03/2022 11:24:15 AM By: Fredirick Maudlin MD  FACS Entered By: Fredirick Maudlin on 06/03/2022 11:24:15 -------------------------------------------------------------------------------- Progress Note Details Patient Name: Date of Service: Destiny Dark F. 06/03/2022 10:30 A M Medical Record Number: [id number] Patient Account Number: [id number] Date of Birth/Sex: Treating RN: 1945-10-20 (76 y.o. 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 After the final reexcision, the wound had an ACell graft applied. She reports that she had good closure of skin over the site. Earlier this year, at the end of May, however, she was diagnosed with rectal cancer and is currently undergoing neoadjuvant therapy (chemotherapy and radiation) in anticipation of future surgery. She reports that about 2 weeks ago, she noted that her skin coverage over the heel was beginning to break down. Her oncologist felt that perhaps the Xeloda was contributing to wound failure and stopped this medication. She continues to receive neoadjuvant radiation therapy, however. She saw plastic surgery last week, and they referred her to the wound care center for further evaluation and management. She is a type II diabetic, well controlled, with the most recent A1c available to me being 5.9%. ABIs done 1 year ago were normal. On the patient's left heel, there is an irregular wound with some skin maceration. The fat layer is exposed and there is slough over the entire surface. Outside of the area of maceration, the skin is in good condition. No  erythema, induration, or odor. No concern for infection. 05/26/2022: The wound is smaller today and is extremely clean, without any slough buildup. The periwound skin is in much better condition and is no longer macerated. She is scheduled to complete her radiation treatment on Wednesday. 06/03/2022: The wound continues to contract. There is just a little bit of eschar and callus accumulation. The surface is clean. She has completed all of her neoadjuvant therapy and is awaiting surgery in November. Patient History Information obtained from Patient. Social History Former smoker - 1991, Marital Status - Divorced, Alcohol Use - Never, Drug Use - No History, Caffeine Use - Moderate - Diet coke,coffee. Medical History Endocrine Patient has history of Type II Diabetes - Metformin pillsand Ozempic Musculoskeletal Patient has history of Osteoarthritis Hospitalization/Surgery History - Melanoma excision to left heel;ORIF Left ankle fracture;Marland Kitchen Medical A Surgical History Notes nd Eyes Macular Degeneration Ear/Nose/Mouth/Throat Tinnitus Oncologic 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 has had Chemotherapy (stopped) and Radiation -8 treatments  left as of 05/19/22 Rectal cancer Psychiatric Hx Depression Objective Constitutional Slightly hypertensive. No acute distress.. Vitals Time Taken: 11:07 AM, Height: 63 in, Temperature: 97.9 F, Pulse: 79 bpm, Respiratory Rate: 18 breaths/min, Blood Pressure: 146/79 mmHg. Respiratory Normal work of breathing on room air.. General Notes: 06/03/2022: The wound continues to contract. There is just a little bit of eschar and callus accumulation. The surface is clean. Integumentary (Hair, Skin) Wound #1 status is Open. Original cause of wound was Hematoma. The date acquired was: 05/05/2022. The wound has been in treatment 2 weeks. The wound is located on the Left Calcaneus. The wound measures 2.7cm length x 2.2cm width x 0.1cm depth; 4.665cm^2 area and 0.467cm^3 volume. There is Fat  Layer (Subcutaneous Tissue) exposed. There is no tunneling or undermining noted. There is a medium amount of serosanguineous drainage noted. The wound margin is flat and intact. There is large (67-100%) red, friable granulation within the wound bed. There is no necrotic tissue within the wound bed. Assessment Active Problems ICD-10 Non-pressure chronic ulcer of left heel and midfoot with fat layer exposed Disruption of external operation (surgical) wound, not elsewhere classified, sequela Malignant melanoma of skin, unspecified Malignant neoplasm of rectum Type 2 diabetes mellitus without complications Personal history of antineoplastic chemotherapy Personal history of irradiation Procedures Wound #1 Pre-procedure diagnosis of Wound #1 is a Trauma, Other located on the Left Calcaneus . There was a Selective/Open Wound Non-Viable Tissue Debridement with a total area of 5.94 sq cm performed by Fredirick Maudlin, MD. With the following instrument(s): Curette to remove Non-Viable tissue/material. Material removed includes Eschar and Callus and after achieving pain control using Lidocaine 4% T opical Solution. No specimens were taken. A time out was conducted at 11:20, prior to the start of the procedure. A Minimum amount of bleeding was controlled with Pressure. The procedure was tolerated well with a pain level of 0 throughout and a pain level of 0 following the procedure. Post Debridement Measurements: 2.7cm length x 2.2cm width x 0.1cm depth; 0.467cm^3 volume. Character of Wound/Ulcer Post Debridement is improved. Post procedure Diagnosis Wound #1: Same as Pre-Procedure Plan Follow-up Appointments: Return Appointment in 1 week. - Dr Celine Ahr Room 1 Tuesday 06/10/22 at 2:00 pm Bathing/ Shower/ Hygiene: Other Bathing/Shower/Hygiene Orders/Instructions: - Change wound dressing after bathing Edema Control - Lymphedema / SCD / Other: Avoid standing for long periods of time. Moisturize legs  daily. Off-Loading: Other: - Please wear off-loading shoe on left foot to keep pressure off the heel The following medication(s) was prescribed: lidocaine topical 4 % cream cream topical for prior to debridement was prescribed at facility WOUND #1: - Calcaneus Wound Laterality: Left Cleanser: Soap and Water 1 x Per Day/30 Days Discharge Instructions: May shower and wash wound with dial antibacterial soap and water prior to dressing change. Cleanser: Wound Cleanser 1 x Per Day/30 Days Discharge Instructions: Cleanse the wound with wound cleanser prior to applying a clean dressing using gauze sponges, not tissue or cotton balls. Prim Dressing: Hydrofera Blue Ready Foam, 2.5 x2.5 in (Generic) 1 x Per Day/30 Days ary Discharge Instructions: Apply to wound bed as instructed Secondary Dressing: Woven Gauze Sponge, Non-Sterile 4x4 in 1 x Per Day/30 Days Discharge Instructions: Apply over primary dressing as directed. Secured With: Elastic Bandage 4 inch (ACE bandage) 1 x Per Day/30 Days Discharge Instructions: Secure with ACE bandage as directed. Secured With: [employer], 4.5x3.1 (in/yd) 1 x Per Day/30 Days Discharge Instructions: Secure with Kerlix as directed. Secured With: [employer]  Cloth Surgical T 2x10 (in/yd) (Generic) 1 x Per Day/30 Days ape Discharge Instructions: Secure with tape as directed. 06/03/2022: The wound continues to contract. There is just a little bit of eschar and callus accumulation. The surface is clean. I used a curette to debride eschar and callus from the wound. We will continue using Hydrofera Blue ready foam. She will continue to wear her heel offloading shoe. Follow-up in 1 week. Electronic Signature(s) Signed: 06/03/2022 11:28:54 AM By: Fredirick Maudlin MD FACS Entered By: Fredirick Maudlin on 06/03/2022 11:28:53 -------------------------------------------------------------------------------- HxROS Details Patient Name: Date of Service: Destiny Dark F. 06/03/2022 10:30 A M Medical Record Number: [id number] Patient Account Number: [id number] Date of Birth/Sex: Treating RN: 1945/12/15 (76 y.o. Destiny [name] Primary Care Provider: Deland Pretty Other Clinician: Referring Provider: Treating Provider/Extender: Raylene Everts in Treatment: 2 Information Obtained From Patient Eyes Medical History: Past Medical History Notes: Macular Degeneration Ear/Nose/Mouth/Throat Medical History: Past Medical History Notes: Tinnitus Endocrine Medical History: Positive for: Type II Diabetes - Metformin pillsand Ozempic Time with diabetes: 25 years Treated with: Oral agents Blood sugar tested every day: Yes Tested : every day Musculoskeletal Medical History: Positive for: Osteoarthritis Oncologic Medical History: Past Medical History Notes: 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 has had Chemotherapy (stopped) and Radiation -8 treatments left as of 05/19/22 Rectal cancer Psychiatric Medical History: Past Medical History Notes: Hx Depression Immunizations Pneumococcal Vaccine: Received Pneumococcal Vaccination: Yes Received Pneumococcal Vaccination On or After 60th Birthday: Yes Implantable Devices None Hospitalization / Surgery History Type of Hospitalization/Surgery Melanoma excision to left heel;ORIF Left ankle fracture; Family and Social History Former smoker - 1991; Marital Status - Divorced; Alcohol Use: Never; Drug Use: No History; Caffeine Use: Moderate - Diet coke,coffee; Financial Concerns: No; Food, Clothing or Shelter Needs: No; Support System Lacking: No; Transportation Concerns: No Electronic Signature(s) Signed: 06/03/2022 12:10:29 PM By: Fredirick Maudlin MD FACS Signed: 06/03/2022 5:37:55 PM By: Baruch Gouty RN, BSN Entered By: Fredirick Maudlin on 06/03/2022 11:26:04 -------------------------------------------------------------------------------- [place] Details Patient Name: Date of  Service: [name] 06/03/2022 Medical Record Number: [id number] Patient Account Number: [id number] Date of Birth/Sex: Treating RN: [birth year] (76 y.o. Destiny [name] Primary Care Provider: Deland Pretty Other Clinician: Referring Provider: Treating Provider/Extender: Raylene Everts in Treatment: 2 Diagnosis Coding ICD-10 Codes Code Description [phone] Non-pressure chronic ulcer of left heel and midfoot with fat layer exposed T81.31XS Disruption of external operation (surgical) wound, not elsewhere classified, sequela C43.9 Malignant melanoma of skin, unspecified C20 Malignant neoplasm of rectum E11.9 Type 2 diabetes mellitus without complications P53.61 Personal history of antineoplastic chemotherapy Z92.3 Personal history of irradiation Facility Procedures CPT4 Code: [id number] Description: [phone] - DEBRIDE WOUND 1ST 20 SQ CM OR < ICD-10 Diagnosis Description L97.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 Modifier: Quantity: 1 Physician Procedures : CPT4 Code Description Modifier [id number] - WC PHYS LEVEL 3 - EST PT 25 ICD-10 Diagnosis Description L97.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 T81.31XS Disruption of external operation (surgical) wound, not  elsewhere classified, sequela C43.9 Malignant melanoma of skin, unspecified Z92.21 Personal history of antineoplastic chemotherapy Quantity: 1 : [id number] - WC PHYS DEBR WO ANESTH 20 SQ CM ICD-10 Diagnosis Description L97.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 Quantity: 1 Electronic Signature(s) Signed: 06/03/2022 11:29:11 AM By: Fredirick Maudlin MD FACS Entered By: Fredirick Maudlin on 06/03/2022 11:29:11

## 2022-06-06 ENCOUNTER — Inpatient Hospital Stay: Payer: PPO

## 2022-06-06 ENCOUNTER — Inpatient Hospital Stay: Payer: PPO | Admitting: Oncology

## 2022-06-06 VITALS — BP 100/70 | HR 73 | Temp 98.1°F | Resp 20 | Ht 63.0 in | Wt 188.8 lb

## 2022-06-06 DIAGNOSIS — C20 Malignant neoplasm of rectum: Secondary | ICD-10-CM

## 2022-06-06 DIAGNOSIS — Z923 Personal history of irradiation: Secondary | ICD-10-CM | POA: Insufficient documentation

## 2022-06-06 DIAGNOSIS — R197 Diarrhea, unspecified: Secondary | ICD-10-CM | POA: Insufficient documentation

## 2022-06-06 DIAGNOSIS — E119 Type 2 diabetes mellitus without complications: Secondary | ICD-10-CM | POA: Insufficient documentation

## 2022-06-06 LAB — CBC WITH DIFFERENTIAL (CANCER CENTER ONLY)
Abs Immature Granulocytes: 0.02 10*3/uL (ref 0.00–0.07)
Basophils Absolute: 0 10*3/uL (ref 0.0–0.1)
Basophils Relative: 1 %
Eosinophils Absolute: 0.2 10*3/uL (ref 0.0–0.5)
Eosinophils Relative: 6 %
HCT: 34.6 % — ABNORMAL LOW (ref 36.0–46.0)
Hemoglobin: 10.9 g/dL — ABNORMAL LOW (ref 12.0–15.0)
Immature Granulocytes: 1 %
Lymphocytes Relative: 16 %
Lymphs Abs: 0.6 10*3/uL — ABNORMAL LOW (ref 0.7–4.0)
MCH: 28 pg (ref 26.0–34.0)
MCHC: 31.5 g/dL (ref 30.0–36.0)
MCV: 88.9 fL (ref 80.0–100.0)
Monocytes Absolute: 0.4 10*3/uL (ref 0.1–1.0)
Monocytes Relative: 10 %
Neutro Abs: 2.6 10*3/uL (ref 1.7–7.7)
Neutrophils Relative %: 66 %
Platelet Count: 223 10*3/uL (ref 150–400)
RBC: 3.89 MIL/uL (ref 3.87–5.11)
RDW: 21.9 % — ABNORMAL HIGH (ref 11.5–15.5)
WBC Count: 3.8 10*3/uL — ABNORMAL LOW (ref 4.0–10.5)
nRBC: 0 % (ref 0.0–0.2)

## 2022-06-06 LAB — CMP (CANCER CENTER ONLY)
ALT: 8 U/L (ref 0–44)
AST: 13 U/L — ABNORMAL LOW (ref 15–41)
Albumin: 4.2 g/dL (ref 3.5–5.0)
Alkaline Phosphatase: 44 U/L (ref 38–126)
Anion gap: 9 (ref 5–15)
BUN: 14 mg/dL (ref 8–23)
CO2: 28 mmol/L (ref 22–32)
Calcium: 8.5 mg/dL — ABNORMAL LOW (ref 8.9–10.3)
Chloride: 106 mmol/L (ref 98–111)
Creatinine: 0.88 mg/dL (ref 0.44–1.00)
GFR, Estimated: >60 mL/min (ref >60)
Glucose, Bld: 111 mg/dL — ABNORMAL HIGH (ref 70–99)
Potassium: 4.1 mmol/L (ref 3.5–5.1)
Sodium: 143 mmol/L (ref 135–145)
Total Bilirubin: 0.6 mg/dL (ref 0.3–1.2)
Total Protein: 6.8 g/dL (ref 6.5–8.1)

## 2022-06-06 NOTE — Progress Notes (Signed)
  [facility] ` Diagnosis: Rectal cancer  INTERVAL HISTORY:   Destiny [name] returns as scheduled.  She completed radiation 05/28/2022.  No new complaint.  She saw Dr. Marcello Moores and is being scheduled for surgery in November.  The left heel ulcer persist.  She is being followed in the wound clinic.  Objective:  Vital signs in last 24 hours:  Blood pressure 100/70, pulse 73, temperature 98.1 F (36.7 C), temperature source Oral, resp. rate 20, height '5\' 3"'$  (1.6 m), weight 188 lb 12.8 oz (85.6 kg), SpO2 98 %.    Lymphatics: No cervical, supraclavicular, axillary, or inguinal nodes Resp: Lungs clear bilaterally Cardio: Regular rate and rhythm GI: No hepatosplenomegaly, no mass, nontender Vascular: No leg edema  Skin: Proximate 4 cm area of breakdown at the left heel, skin thickening and callus formation at the plantar surface of the foot bilaterally.  No erythema.   Lab Results:  Lab Results  Component Value Date   WBC 3.8 (L) 06/06/2022   HGB 10.9 (L) 06/06/2022   HCT 34.6 (L) 06/06/2022   MCV 88.9 06/06/2022   PLT 223 06/06/2022   NEUTROABS 2.6 06/06/2022    CMP  Lab Results  Component Value Date   NA 142 05/07/2022   K 4.4 05/07/2022   CL 103 05/07/2022   CO2 27 05/07/2022   GLUCOSE 86 05/07/2022   BUN 16 05/07/2022   CREATININE 0.89 05/07/2022   CALCIUM 9.2 05/07/2022   PROT 6.7 05/07/2022   ALBUMIN 4.5 05/07/2022   AST 13 (L) 05/07/2022   ALT 10 05/07/2022   ALKPHOS 46 05/07/2022   BILITOT 0.7 05/07/2022   GFRNONAA >60 05/07/2022   GFRAA >60 09/24/2017    Lab Results  Component Value Date   CEA 3.5 (H) 03/06/2022   Medications: I have reviewed the patient's current medications.   Assessment/Plan: Rectal cancer Colonoscopy 03/06/2022-nonobstructing mass at 12 cm from the anal verge-biopsy invasive moderately differentiated adenocarcinoma arising within a tubular adenoma with high-grade dysplasia CTs 03/13/2022-no rectal mass seen, no evidence of  metastatic disease, small 2-4 mm pulmonary nodules-at least 2 are calcified, likely benign granulomas, nonobstructing stone in the interpolar right renal collecting system MRI pelvis 03/30/2022-tumor at 11-13 cm from the anal verge, T3b, early EMVI?,  No adenopathy Neoadjuvant radiation/capecitabine 04/21/2022-05/28/2022 Xeloda discontinued 05/15/2022 due to progressive breakdown/ulceration at the left heel.   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  Diabetes   4.  Laparoscopic gastric band surgery  5.  Left heel ulceration-progressive 05/15/2022.  Xeloda discontinued.  Followed at the wound clinic.    Disposition: Ms. Wyke has completed the course of neoadjuvant radiation.  She completed a partial course of Xeloda.  Xeloda was discontinued secondary to skin breakdown at the left heel.  The left heel ulcer appears improved today.  She will continue follow-up at the wound clinic.  She reports she will be scheduled for surgery in November.  We will see her following surgery to discuss the indication for adjuvant systemic therapy.  Destiny Coder, MD  06/06/2022  9:53 AM

## 2022-06-10 ENCOUNTER — Encounter (HOSPITAL_BASED_OUTPATIENT_CLINIC_OR_DEPARTMENT_OTHER): Payer: PPO | Admitting: General Surgery

## 2022-06-10 DIAGNOSIS — T8131XA Disruption of external operation (surgical) wound, not elsewhere classified, initial encounter: Secondary | ICD-10-CM | POA: Diagnosis not present

## 2022-06-10 DIAGNOSIS — S91311A Laceration without foreign body, right foot, initial encounter: Secondary | ICD-10-CM | POA: Diagnosis not present

## 2022-06-10 DIAGNOSIS — E11621 Type 2 diabetes mellitus with foot ulcer: Secondary | ICD-10-CM | POA: Diagnosis not present

## 2022-06-10 NOTE — Progress Notes (Signed)
Destiny [name], Destiny [name] ([id number]) Visit Report for 06/10/2022 Chief Complaint Document Details Patient Name: Date of Service: Destiny [name], Destiny [name] 06/10/2022 2:00 PM Medical Record Number: [id number] Patient Account Number: [id number] Date of Birth/Sex: Treating RN: 1946/09/13 (76 y.o. Elam Dutch Primary Care Provider: Deland Pretty Other Clinician: Referring Provider: Treating Provider/Extender: Raylene Everts in Treatment: 3 Information Obtained from: Patient Chief Complaint Patient presents to the wound care center with open non-healing surgical wound(s) Electronic Signature(s) Signed: 06/10/2022 2:59:27 PM By: Fredirick Maudlin MD FACS Entered By: Fredirick Maudlin on 06/10/2022 14:59:27 -------------------------------------------------------------------------------- Debridement Details Patient Name: Date of Service: Destiny Dark F. 06/10/2022 2:00 PM Medical Record Number: [id number] Patient Account Number: [id number] Date of Birth/Sex: Treating RN: [birth year] (76 y.o. Elam Dutch Primary Care Provider: Deland Pretty Other Clinician: Referring Provider: Treating Provider/Extender: Raylene Everts in Treatment: 3 Debridement Performed for Assessment: Wound #1 Left Calcaneus Performed By: Physician Fredirick Maudlin, MD Debridement Type: Debridement Level of Consciousness (Pre-procedure): Awake and Alert Pre-procedure Verification/Time Out Yes - 15:55 Taken: Start Time: 14:55 Pain Control: Lidocaine 4% T opical Solution T Area Debrided (L x W): otal 2.5 (cm) x 1.5 (cm) = 3.75 (cm) Tissue and other material debrided: Non-Viable, Eschar, Skin: Epidermis, Biofilm Level: Skin/Epidermis Debridement Description: Selective/Open Wound Instrument: Curette Bleeding: Minimum Hemostasis Achieved: Pressure Procedural Pain: 0 Post Procedural Pain: 0 Response to Treatment: Procedure was tolerated well Level of Consciousness (Post-  Awake and Alert procedure): Post Debridement Measurements of Total Wound Length: (cm) 1.9 Width: (cm) 1.2 Depth: (cm) 0.1 Volume: (cm) 0.179 Character of Wound/Ulcer Post Debridement: Improved Post Procedure Diagnosis Same as Pre-procedure Electronic Signature(s) Signed: 06/10/2022 3:08:20 PM By: Fredirick Maudlin MD FACS Signed: 06/10/2022 6:08:33 PM By: Baruch Gouty RN, BSN Entered By: Baruch Gouty on 06/10/2022 14:56:10 -------------------------------------------------------------------------------- HPI Details Patient Name: Date of Service: Destiny Dark F. 06/10/2022 2:00 PM Medical Record Number: [id number] Patient Account Number: [id number] Date of Birth/Sex: Treating RN: [birth year] (76 y.o. 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 After the final reexcision, the wound had an ACell graft applied. She reports that she had good closure of skin over the site. Earlier this year, at the end of May, however, she was diagnosed with rectal cancer and is currently undergoing neoadjuvant therapy (chemotherapy and radiation) in anticipation of future surgery. She reports that about 2 weeks ago, she noted that her skin coverage over the heel was beginning to break down. Her oncologist felt that perhaps the Xeloda was contributing to wound failure and stopped this medication. She continues to receive neoadjuvant radiation therapy, however. She saw plastic surgery last week, and they referred her to the wound care center for further evaluation and management. She is a type II diabetic, well controlled, with the most recent A1c available to me being 5.9%. ABIs done 1 year ago were  normal. On the patient's left heel, there is an irregular wound with some skin maceration. The fat layer is exposed and there is slough over the entire surface. Outside of the area of maceration, the skin is in good condition. No erythema, induration, or odor. No concern for infection. 05/26/2022: The wound is smaller today and is extremely clean, without any slough buildup. The periwound skin is in much  better condition and is no longer macerated. She is scheduled to complete her radiation treatment on Wednesday. 06/03/2022: The wound continues to contract. There is just a little bit of eschar and callus accumulation. The surface is clean. She has completed all of her neoadjuvant therapy and is awaiting surgery in November. 06/10/2022: The wound is smaller by about a centimeter in all dimensions. She does have a little bit of eschar near the Achilles portion of the wound. The wound surface itself is robust and healthy-appearing. Electronic Signature(s) Signed: 06/10/2022 3:00:09 PM By: Fredirick Maudlin MD FACS Entered By: Fredirick Maudlin on 06/10/2022 15:00:09 -------------------------------------------------------------------------------- Physical Exam Details Patient Name: Date of Service: Destiny Dark F. 06/10/2022 2:00 PM Medical Record Number: [id number] Patient Account Number: [id number] Date of Birth/Sex: Treating RN: 1946-09-06 (76 y.o. Elam Dutch Primary Care Provider: Deland Pretty Other Clinician: Referring Provider: Treating Provider/Extender: Raylene Everts in Treatment: 3 Constitutional . . . . No acute distress.Marland Kitchen Respiratory Normal work of breathing on room air.. Notes 06/10/2022: The wound is smaller by about a centimeter in all dimensions. She does have a little bit of eschar near the Achilles portion of the wound. The wound surface itself is robust and healthy-appearing. Electronic Signature(s) Signed: 06/10/2022 3:00:42 PM By: Fredirick Maudlin MD FACS Entered By: Fredirick Maudlin on 06/10/2022 15:00:41 -------------------------------------------------------------------------------- Physician Orders Details Patient Name: Date of Service: Destiny Dark F. 06/10/2022 2:00 PM Medical Record Number: [id number] Patient Account Number: [id number] Date of Birth/Sex: Treating RN: 03/12/1946 (76 y.o. Elam Dutch Primary Care Provider: Deland Pretty Other Clinician: Referring Provider: Treating Provider/Extender: Raylene Everts in Treatment: 3 Verbal / Phone Orders: No Diagnosis Coding ICD-10 Coding Code Description [phone] Non-pressure chronic ulcer of left heel and midfoot with fat layer exposed T81.31XS Disruption of external operation (surgical) wound, not elsewhere classified, sequela C43.9 Malignant melanoma of skin, unspecified C20 Malignant neoplasm of rectum E11.9 Type 2 diabetes mellitus without complications I14.43 Personal history of antineoplastic chemotherapy Z92.3 Personal history of irradiation Follow-up Appointments ppointment in 1 week. - Tuesday 06/24/22 at 2:45 pm Return A Bathing/ Shower/ Hygiene Other Bathing/Shower/Hygiene Orders/Instructions: - Change wound dressing after bathing Edema Control - Lymphedema / SCD / Other Avoid standing for long periods of time. Moisturize legs daily. Off-Loading Other: - Please wear off-loading shoe on left foot to keep pressure off the heel Wound Treatment Wound #1 - Calcaneus Wound Laterality: Left Cleanser: Soap and Water 1 x Per Day/30 Days Discharge Instructions: May shower and wash wound with dial antibacterial soap and water prior to dressing change. Cleanser: Wound Cleanser 1 x Per Day/30 Days Discharge Instructions: Cleanse the wound with wound cleanser prior to applying a clean dressing using gauze sponges, not tissue or cotton balls. Prim Dressing: Hydrofera Blue Ready Foam, 2.5 x2.5 in (Generic) 1 x Per Day/30  Days ary Discharge Instructions: Apply to wound bed as instructed Secondary Dressing: Woven Gauze Sponge, Non-Sterile 4x4 in 1 x Per Day/30 Days Discharge Instructions: Apply over primary dressing as directed. Secured With: Elastic Bandage 4 inch (ACE bandage) 1 x Per Day/30 Days Discharge Instructions: Secure with ACE bandage as directed. Secured With: [employer], 4.5x3.1 (in/yd) 1 x Per Day/30 Days Discharge Instructions: Secure with Kerlix as directed. Secured With: 27M Medipore Public affairs consultant Surgical T 2x10 (in/yd) (Generic) 1 x Per Day/30 Days ape Discharge Instructions: Secure with tape as directed. Electronic Signature(s) Signed: 06/10/2022 3:08:20 PM By: Fredirick Maudlin MD FACS Entered By: Fredirick Maudlin on 06/10/2022  15:01:11 -------------------------------------------------------------------------------- Problem List Details Patient Name: Date of Service: KAYLONI, ROCCO 06/10/2022 2:00 PM Medical Record Number: [id number] Patient Account Number: [id number] Date of Birth/Sex: Treating RN: August 14, 1946 (76 y.o. Elam Dutch Primary Care Provider: Deland Pretty Other Clinician: Referring Provider: Treating Provider/Extender: Raylene Everts in Treatment: 3 Active Problems ICD-10 Encounter Code Description Active Date MDM Diagnosis [phone] Non-pressure chronic ulcer of left heel and midfoot with fat layer exposed 05/19/2022 No Yes T81.31XS Disruption of external operation (surgical) wound, not elsewhere classified, 05/19/2022 No Yes sequela C43.9 Malignant melanoma of skin, unspecified 05/19/2022 No Yes C20 Malignant neoplasm of rectum 05/19/2022 No Yes E11.9 Type 2 diabetes mellitus without complications 02/13/6467 No Yes Z92.21 Personal history of antineoplastic chemotherapy 05/19/2022 No Yes Z92.3 Personal history of irradiation 05/19/2022 No Yes Inactive Problems Resolved Problems Electronic Signature(s) Signed: 06/10/2022 2:59:08 PM By:  Fredirick Maudlin MD FACS Entered By: Fredirick Maudlin on 06/10/2022 14:59:08 -------------------------------------------------------------------------------- Progress Note Details Patient Name: Date of Service: Destiny Dark F. 06/10/2022 2:00 PM Medical Record Number: [id number] Patient Account Number: [id number] Date of Birth/Sex: Treating RN: Feb 19, 1946 (76 y.o. 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 After the final reexcision, the wound had an ACell graft applied. She reports that she had good closure of skin over the site. Earlier this year, at the end of May, however, she was diagnosed with rectal cancer and is currently undergoing neoadjuvant therapy (chemotherapy and radiation) in anticipation of future surgery. She reports that about 2 weeks ago, she noted that her skin coverage over the heel was beginning to break down. Her oncologist felt that perhaps the Xeloda was contributing to wound failure and stopped this medication. She continues to receive neoadjuvant radiation therapy, however. She saw plastic surgery last week, and they referred her to the wound care center for further evaluation and management. She is a type II diabetic, well controlled, with the most recent A1c available to me being 5.9%. ABIs done 1 year ago were normal. On the patient's left heel, there is an irregular wound with some skin maceration. The fat layer is exposed and there is slough over the entire surface. Outside of the area of maceration, the skin is in good  condition. No erythema, induration, or odor. No concern for infection. 05/26/2022: The wound is smaller today and is extremely clean, without any slough buildup. The periwound skin is in much better condition and is no longer macerated. She is scheduled to complete her radiation treatment on Wednesday. 06/03/2022: The wound continues to contract. There is just a little bit of eschar and callus accumulation. The surface is clean. She has completed all of her neoadjuvant therapy and is awaiting surgery in November. 06/10/2022: The wound is smaller by about a centimeter in all dimensions. She does have a little bit of eschar near the Achilles portion of the wound. The wound surface itself is robust and healthy-appearing. Patient History Information obtained from Patient. Social History Former smoker - 1991, Marital Status - Divorced, Alcohol Use - Never, Drug Use - No History, Caffeine Use - Moderate - Diet coke,coffee. Medical History Endocrine Patient has history of Type II Diabetes -  Metformin pillsand Ozempic Musculoskeletal Patient has history of Osteoarthritis Hospitalization/Surgery History - Melanoma excision to left heel;ORIF Left ankle fracture;Marland Kitchen Medical A Surgical History Notes nd Eyes Macular Degeneration Ear/Nose/Mouth/Throat Tinnitus Oncologic 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 has had Chemotherapy (stopped) and Radiation -8 treatments left as of 05/19/22 Rectal cancer Psychiatric Hx Depression Objective Constitutional No acute distress.. Vitals Time Taken: 2:36 PM, Height: 63 in, Temperature: 98.1 F, Pulse: 78 bpm, Respiratory Rate: 18 breaths/min, Blood Pressure: 117/76 mmHg. Respiratory Normal work of breathing on room air.. General Notes: 06/10/2022: The wound is smaller by about a centimeter in all dimensions. She does have a little bit of eschar near the Achilles portion of the wound. The wound surface itself is robust and healthy-appearing. Integumentary (Hair, Skin) Wound #1  status is Open. Original cause of wound was Hematoma. The date acquired was: 05/05/2022. The wound has been in treatment 3 weeks. The wound is located on the Left Calcaneus. The wound measures 1.9cm length x 1.2cm width x 0.1cm depth; 1.791cm^2 area and 0.179cm^3 volume. There is Fat Layer (Subcutaneous Tissue) exposed. There is no tunneling or undermining noted. There is a medium amount of serosanguineous drainage noted. The wound margin is flat and intact. There is large (67-100%) red, friable granulation within the wound bed. There is no necrotic tissue within the wound bed. Assessment Active Problems ICD-10 Non-pressure chronic ulcer of left heel and midfoot with fat layer exposed Disruption of external operation (surgical) wound, not elsewhere classified, sequela Malignant melanoma of skin, unspecified Malignant neoplasm of rectum Type 2 diabetes mellitus without complications Personal history of antineoplastic chemotherapy Personal history of irradiation Procedures Wound #1 Pre-procedure diagnosis of Wound #1 is a Trauma, Other located on the Left Calcaneus . There was a Selective/Open Wound Skin/Epidermis Debridement with a total area of 3.75 sq cm performed by Fredirick Maudlin, MD. With the following instrument(s): Curette to remove Non-Viable tissue/material. Material removed includes Eschar, Skin: Epidermis, and Biofilm after achieving pain control using Lidocaine 4% Topical Solution. No specimens were taken. A time out was conducted at 15:55, prior to the start of the procedure. A Minimum amount of bleeding was controlled with Pressure. The procedure was tolerated well with a pain level of 0 throughout and a pain level of 0 following the procedure. Post Debridement Measurements: 1.9cm length x 1.2cm width x 0.1cm depth; 0.179cm^3 volume. Character of Wound/Ulcer Post Debridement is improved. Post procedure Diagnosis Wound #1: Same as Pre-Procedure Plan Follow-up  Appointments: Return Appointment in 1 week. - Tuesday 06/24/22 at 2:45 pm Bathing/ Shower/ Hygiene: Other Bathing/Shower/Hygiene Orders/Instructions: - Change wound dressing after bathing Edema Control - Lymphedema / SCD / Other: Avoid standing for long periods of time. Moisturize legs daily. Off-Loading: Other: - Please wear off-loading shoe on left foot to keep pressure off the heel WOUND #1: - Calcaneus Wound Laterality: Left Cleanser: Soap and Water 1 x Per Day/30 Days Discharge Instructions: May shower and wash wound with dial antibacterial soap and water prior to dressing change. Cleanser: Wound Cleanser 1 x Per Day/30 Days Discharge Instructions: Cleanse the wound with wound cleanser prior to applying a clean dressing using gauze sponges, not tissue or cotton balls. Prim Dressing: Hydrofera Blue Ready Foam, 2.5 x2.5 in (Generic) 1 x Per Day/30 Days ary Discharge Instructions: Apply to wound bed as instructed Secondary Dressing: Woven Gauze Sponge, Non-Sterile 4x4 in 1 x Per Day/30 Days Discharge Instructions: Apply over primary dressing as directed. Secured With: Elastic Bandage 4 inch (ACE bandage) 1 x Per Day/30 Days Discharge  Instructions: Secure with ACE bandage as directed. Secured With: [employer], 4.5x3.1 (in/yd) 1 x Per Day/30 Days Discharge Instructions: Secure with Kerlix as directed. Secured With: 55M Medipore Public affairs consultant Surgical T 2x10 (in/yd) (Generic) 1 x Per Day/30 Days ape Discharge Instructions: Secure with tape as directed. 06/10/2022: The wound is smaller by about a centimeter in all dimensions. She does have a little bit of eschar near the Achilles portion of the wound. The wound surface itself is robust and healthy-appearing. I used a curette to debride eschar and biofilm from her wound. We will continue Hydrofera Blue. Per patient request, she will follow-up in 2 weeks. Electronic Signature(s) Signed: 06/10/2022 3:01:40 PM By: Fredirick Maudlin MD  FACS Entered By: Fredirick Maudlin on 06/10/2022 15:01:40 -------------------------------------------------------------------------------- HxROS Details Patient Name: Date of Service: Destiny Dark F. 06/10/2022 2:00 PM Medical Record Number: [id number] Patient Account Number: [id number] Date of Birth/Sex: Treating RN: 1946-09-05 (76 y.o. Elam Dutch Primary Care Provider: Deland Pretty Other Clinician: Referring Provider: Treating Provider/Extender: Raylene Everts in Treatment: 3 Information Obtained From Patient Eyes Medical History: Past Medical History Notes: Macular Degeneration Ear/Nose/Mouth/Throat Medical History: Past Medical History Notes: Tinnitus Endocrine Medical History: Positive for: Type II Diabetes - Metformin pillsand Ozempic Time with diabetes: 25 years Treated with: Oral agents Blood sugar tested every day: Yes Tested : every day Musculoskeletal Medical History: Positive for: Osteoarthritis Oncologic Medical History: Past Medical History Notes: 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 has had Chemotherapy (stopped) and Radiation -8 treatments left as of 05/19/22 Rectal cancer Psychiatric Medical History: Past Medical History Notes: Hx Depression Immunizations Pneumococcal Vaccine: Received Pneumococcal Vaccination: Yes Received Pneumococcal Vaccination On or After 60th Birthday: Yes Implantable Devices None Hospitalization / Surgery History Type of Hospitalization/Surgery Melanoma excision to left heel;ORIF Left ankle fracture; Family and Social History Former smoker - 1991; Marital Status - Divorced; Alcohol Use: Never; Drug Use: No History; Caffeine Use: Moderate - Diet coke,coffee; Financial Concerns: No; Food, Clothing or Shelter Needs: No; Support System Lacking: No; Transportation Concerns: No Engineer, maintenance) Signed: 06/10/2022 3:08:20 PM By: Fredirick Maudlin MD FACS Signed: 06/10/2022 6:08:33 PM By: Baruch Gouty  RN, BSN Entered By: Fredirick Maudlin on 06/10/2022 15:00:18 -------------------------------------------------------------------------------- SuperBill Details Patient Name: Date of Service: [name] 06/10/2022 Medical Record Number: [id number] Patient Account Number: [id number] Date of Birth/Sex: Treating RN: [birth year] (76 y.o. Elam Dutch Primary Care Provider: Deland Pretty Other Clinician: Referring Provider: Treating Provider/Extender: Raylene Everts in Treatment: 3 Diagnosis Coding ICD-10 Codes Code Description [phone] Non-pressure chronic ulcer of left heel and midfoot with fat layer exposed T81.31XS Disruption of external operation (surgical) wound, not elsewhere classified, sequela C43.9 Malignant melanoma of skin, unspecified C20 Malignant neoplasm of rectum E11.9 Type 2 diabetes mellitus without complications F68.12 Personal history of antineoplastic chemotherapy Z92.3 Personal history of irradiation Facility Procedures CPT4 Code: [id number] Description: [phone] - DEBRIDE WOUND 1ST 20 SQ CM OR < ICD-10 Diagnosis Description L97.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 Modifier: Quantity: 1 Physician Procedures : CPT4 Code Description Modifier [id number] - WC PHYS LEVEL 3 - EST PT 25 ICD-10 Diagnosis Description L97.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 T81.31XS Disruption of external operation (surgical) wound, not  elsewhere classified, sequela Z92.21 Personal history of antineoplastic chemotherapy Z92.3 Personal history of irradiation Quantity: 1 : [id number] - WC PHYS DEBR WO ANESTH 20 SQ CM ICD-10 Diagnosis Description L97.422 Non-pressure chronic  ulcer of left heel and midfoot with fat layer exposed Quantity: 1 Electronic Signature(s) Signed: 06/10/2022 3:02:04 PM By: Fredirick Maudlin MD FACS Entered By: Fredirick Maudlin on 06/10/2022 15:02:03

## 2022-06-11 NOTE — Progress Notes (Signed)
Destiny [name] ([id number]) Visit Report for 06/10/2022 Arrival Information Details Patient Name: Date of Service: Destiny [name] 06/10/2022 2:00 PM Medical Record Number: [id number] Patient Account Number: [id number] Date of Birth/Sex: Treating RN: [birth year] (76 y.o. Destiny [name], Linda Primary Care Kass Herberger: Deland Pretty Other Clinician: Referring Rosland [name]: Treating Jariyah Hackley/Extender: Raylene Everts in Treatment: 3 Visit Information History Since Last Visit Added or deleted any medications: No Patient Arrived: Ambulatory Any new allergies or adverse reactions: No Arrival Time: 14:30 Had a fall or experienced change in No Accompanied By: self activities of daily living that may affect Transfer Assistance: None risk of falls: Patient Identification Verified: Yes Signs or symptoms of abuse/neglect since last visito No Secondary Verification Process Completed: Yes Hospitalized since last visit: No Patient Requires Transmission-Based Precautions: No Implantable device outside of the clinic excluding No Patient Has Alerts: No cellular tissue based products placed in the center since last visit: Has Dressing in Place as Prescribed: Yes Pain Present Now: No Electronic Signature(s) Signed: 06/10/2022 6:08:33 PM By: Baruch Gouty RN, BSN Entered By: Baruch Gouty on 06/10/2022 14:36:27 -------------------------------------------------------------------------------- Encounter Discharge Information Details Patient Name: Date of Service: Destiny Dark F. 06/10/2022 2:00 PM Medical Record Number: [id number] Patient Account Number: [id number] Date of Birth/Sex: Treating RN: 1946/06/30 (76 y.o. Destiny [name] Primary Care Sehaj Mcenroe: Deland Pretty Other Clinician: Referring Delpha Perko: Treating Tamisha Nordstrom/Extender: Raylene Everts in Treatment: 3 Encounter Discharge Information Items Post Procedure Vitals Discharge Condition:  Stable Temperature (F): 98.1 Ambulatory Status: Ambulatory Pulse (bpm): 78 Discharge Destination: Home Respiratory Rate (breaths/min): 18 Transportation: Private Auto Blood Pressure (mmHg): 117/76 Accompanied By: self Schedule Follow-up Appointment: Yes Clinical Summary of Care: Patient Declined Electronic Signature(s) Signed: 06/10/2022 6:08:33 PM By: Baruch Gouty RN, BSN Entered By: Baruch Gouty on 06/10/2022 15:07:38 -------------------------------------------------------------------------------- Lower Extremity Assessment Details Patient Name: Date of Service: Destiny Dark F. 06/10/2022 2:00 PM Medical Record Number: [id number] Patient Account Number: [id number] Date of Birth/Sex: Treating RN: September 03, 1946 (76 y.o. Destiny [name] Primary Care Argelio Granier: Deland Pretty Other Clinician: Referring Allan Bacigalupi: Treating Othella Slappey/Extender: Raylene Everts in Treatment: 3 Edema Assessment Assessed: [Left: No] [Right: No] Edema: [Left: N] [Right: o] Calf Left: Right: Point of Measurement: 29 cm From Medial Instep 35.7 cm Ankle Left: Right: Point of Measurement: 10 cm From Medial Instep 21.6 cm Vascular Assessment Pulses: Dorsalis Pedis Palpable: [Left:Yes] Electronic Signature(s) Signed: 06/10/2022 6:08:33 PM By: Baruch Gouty RN, BSN Entered By: Baruch Gouty on 06/10/2022 14:40:44 -------------------------------------------------------------------------------- Multi Wound Chart Details Patient Name: Date of Service: Destiny Dark F. 06/10/2022 2:00 PM Medical Record Number: [id number] Patient Account Number: [id number] Date of Birth/Sex: Treating RN: 07/14/1946 (76 y.o. Destiny [name] Primary Care Arohi Salvatierra: Deland Pretty Other Clinician: Referring Varnell Donate: Treating Rickia Freeburg/Extender: Raylene Everts in Treatment: 3 Vital Signs Height(in): 63 Pulse(bpm): 26 Weight(lbs): Blood Pressure(mmHg):  117/76 Body Mass Index(BMI): Temperature(F): 98.1 Respiratory Rate(breaths/min): 18 Photos: [1:Left Calcaneus] [N/A:N/A N/A] Wound Location: [1:Hematoma] [N/A:N/A] Wounding Event: [1:Trauma, Other] [N/A:N/A] Primary Etiology: [1:Type II Diabetes, Osteoarthritis] [N/A:N/A] Comorbid History: [1:05/05/2022] [N/A:N/A] Date Acquired: [1:3] [N/A:N/A] Weeks of Treatment: [1:Open] [N/A:N/A] Wound Status: [1:No] [N/A:N/A] Wound Recurrence: [1:1.9x1.2x0.1] [N/A:N/A] Measurements L x W x D (cm) [1:1.791] [N/A:N/A] A (cm) : rea [1:0.179] [N/A:N/A] Volume (cm) : [1:73.70%] [N/A:N/A] % Reduction in A rea: [1:86.90%] [N/A:N/A] % Reduction in Volume: [1:Full Thickness Without Exposed] [N/A:N/A] Classification: [1:Support Structures Medium] [N/A:N/A] Exudate A mount: [1:Serosanguineous] [N/A:N/A] Exudate Type: [1:red, brown] [N/A:N/A] Exudate Color: [1:Flat and Intact] [  N/A:N/A] Wound Margin: [1:Large (67-100%)] [N/A:N/A] Granulation A mount: [1:Red, Friable] [N/A:N/A] Granulation Quality: [1:None Present (0%)] [N/A:N/A] Necrotic A mount: [1:Fat Layer (Subcutaneous Tissue): Yes N/A] Exposed Structures: [1:Fascia: No Tendon: No Muscle: No Joint: No Bone: No Small (1-33%)] [N/A:N/A] Epithelialization: [1:Debridement - Selective/Open Wound N/A] Debridement: Pre-procedure Verification/Time Out 15:55 [N/A:N/A] Taken: [1:Lidocaine 4% Topical Solution] [N/A:N/A] Pain Control: [1:Necrotic/Eschar] [N/A:N/A] Tissue Debrided: [1:Skin/Epidermis] [N/A:N/A] Level: [1:3.75] [N/A:N/A] Debridement A (sq cm): [1:rea Curette] [N/A:N/A] Instrument: [1:Minimum] [N/A:N/A] Bleeding: [1:Pressure] [N/A:N/A] Hemostasis A chieved: [1:0] [N/A:N/A] Procedural Pain: [1:0] [N/A:N/A] Post Procedural Pain: [1:Procedure was tolerated well] [N/A:N/A] Debridement Treatment Response: [1:1.9x1.2x0.1] [N/A:N/A] Post Debridement Measurements L x W x D (cm) [1:0.179] [N/A:N/A] Post Debridement Volume: (cm) [1:Debridement]  [N/A:N/A] Treatment Notes Electronic Signature(s) Signed: 06/10/2022 2:59:19 PM By: Fredirick Maudlin MD FACS Signed: 06/10/2022 6:08:33 PM By: Baruch Gouty RN, BSN Entered By: Fredirick Maudlin on 06/10/2022 14:59:18 -------------------------------------------------------------------------------- Multi-Disciplinary Care Plan Details Patient Name: Date of Service: Destiny Dark F. 06/10/2022 2:00 PM Medical Record Number: [id number] Patient Account Number: [id number] Date of Birth/Sex: Treating RN: Mar 02, 1946 (76 y.o. Destiny [name] Primary Care Delta Deshmukh: Deland Pretty Other Clinician: Referring Cynthia Stainback: Treating Tearra Ouk/Extender: Raylene Everts in Treatment: [place] reviewed with physician Active Inactive Wound/Skin Impairment Nursing Diagnoses: Impaired tissue integrity Goals: Patient/caregiver will verbalize understanding of skin care regimen Date Initiated: 05/19/2022 Target Resolution Date: 07/12/2022 Goal Status: Active Interventions: Assess ulceration(s) every visit Treatment Activities: Skin care regimen initiated : 05/19/2022 Notes: Electronic Signature(s) Signed: 06/10/2022 6:08:33 PM By: Baruch Gouty RN, BSN Entered By: Baruch Gouty on 06/10/2022 14:45:00 -------------------------------------------------------------------------------- Pain Assessment Details Patient Name: Date of Service: Destiny Dark F. 06/10/2022 2:00 PM Medical Record Number: [id number] Patient Account Number: [id number] Date of Birth/Sex: Treating RN: Jun 12, 1946 (76 y.o. Destiny [name] Primary Care Giovannina Mun: Deland Pretty Other Clinician: Referring Iyani Dresner: Treating Carrah Eppolito/Extender: Raylene Everts in Treatment: 3 Active Problems Location of Pain Severity and Description of Pain Patient Has Paino No Site Locations With Dressing Change: Yes Duration of the Pain. Constant / Intermittento  Intermittent Rate the pain. Current Pain Level: 0 Worst Pain Level: 3 Character of Pain Describe the Pain: Sharp, Tender Pain Management and Medication Current Pain Management: Is the Current Pain Management Adequate: Adequate How does your wound impact your activities of daily livingo Sleep: No Bathing: No Appetite: No Relationship With Others: No Bladder Continence: No Emotions: No Bowel Continence: No Work: No Toileting: No Drive: No Dressing: No Hobbies: No Engineer, maintenance) Signed: 06/10/2022 6:08:33 PM By: Baruch Gouty RN, BSN Entered By: Baruch Gouty on 06/10/2022 14:37:30 -------------------------------------------------------------------------------- Patient/Caregiver Education Details Patient Name: Date of Service: Destiny [name] 8/29/2023andnbsp2:00 PM Medical Record Number: [id number] Patient Account Number: [id number] Date of Birth/Gender: Treating RN: 07-20-1946 (76 y.o. Destiny [name] Primary Care Physician: Deland Pretty Other Clinician: Referring Physician: Treating Physician/Extender: Raylene Everts in Treatment: 3 Education Assessment Education Provided To: Patient Education Topics Provided Wound/Skin Impairment: Methods: Explain/Verbal Responses: Reinforcements needed, State content correctly Electronic Signature(s) Signed: 06/10/2022 6:08:33 PM By: Baruch Gouty RN, BSN Entered By: Baruch Gouty on 06/10/2022 14:45:15 -------------------------------------------------------------------------------- Wound Assessment Details Patient Name: Date of Service: Destiny [name]. 06/10/2022 2:00 PM Medical Record Number: [id number] Patient Account Number: [id number] Date of Birth/Sex: Treating RN: [birth year] (76 y.o. Destiny [name] Primary Care Milas Schappell: Deland Pretty Other Clinician: Referring Asencion Guisinger: Treating Caddie Randle/Extender: Raylene Everts in Treatment: 3 Wound  Status Wound Number: 1 Primary Etiology: Trauma, Other  Wound Location: Left Calcaneus Wound Status: Open Wounding Event: Hematoma Comorbid History: Type II Diabetes, Osteoarthritis Date Acquired: 05/05/2022 Weeks Of Treatment: 3 Clustered Wound: No Photos Wound Measurements Length: (cm) 1.9 Width: (cm) 1.2 Depth: (cm) 0.1 Area: (cm) 1.791 Volume: (cm) 0.179 % Reduction in Area: 73.7% % Reduction in Volume: 86.9% Epithelialization: Small (1-33%) Tunneling: No Undermining: No Wound Description Classification: Full Thickness Without Exposed Support Structures Wound Margin: Flat and Intact Exudate Amount: Medium Exudate Type: Serosanguineous Exudate Color: red, brown Foul Odor After Cleansing: No Slough/Fibrino No Wound Bed Granulation Amount: Large (67-100%) Exposed Structure Granulation Quality: Red, Friable Fascia Exposed: No Necrotic Amount: None Present (0%) Fat Layer (Subcutaneous Tissue) Exposed: Yes Tendon Exposed: No Muscle Exposed: No Joint Exposed: No Bone Exposed: No Treatment Notes Wound #1 (Calcaneus) Wound Laterality: Left Cleanser Soap and Water Discharge Instruction: May shower and wash wound with dial antibacterial soap and water prior to dressing change. Wound Cleanser Discharge Instruction: Cleanse the wound with wound cleanser prior to applying a clean dressing using gauze sponges, not tissue or cotton balls. Peri-Wound Care Topical Primary Dressing Hydrofera Blue Ready Foam, 2.5 x2.5 in Discharge Instruction: Apply to wound bed as instructed Secondary Dressing Woven Gauze Sponge, Non-Sterile 4x4 in Discharge Instruction: Apply over primary dressing as directed. Secured With Elastic Bandage 4 inch (ACE bandage) Discharge Instruction: Secure with ACE bandage as directed. Kerlix Roll Sterile, 4.5x3.1 (in/yd) Discharge Instruction: Secure with Kerlix as directed. 32M Medipore Soft Cloth Surgical T 2x10 (in/yd) ape Discharge Instruction:  Secure with tape as directed. Compression Wrap Compression Stockings Add-Ons Electronic Signature(s) Signed: 06/10/2022 6:08:33 PM By: Baruch Gouty RN, BSN Signed: 06/11/2022 4:37:26 PM By: Sharyn Creamer RN, BSN Entered By: Sharyn Creamer on 06/10/2022 14:43:08 -------------------------------------------------------------------------------- Vitals Details Patient Name: Date of Service: Destiny Dark F. 06/10/2022 2:00 PM Medical Record Number: [id number] Patient Account Number: [id number] Date of Birth/Sex: Treating RN: [birth year] (76 y.o. Destiny [name] Primary Care Jaqlyn Gruenhagen: Deland Pretty Other Clinician: Referring Hartleigh Edmonston: Treating Lyra Alaimo/Extender: Raylene Everts in Treatment: 3 Vital Signs Time Taken: 14:36 Temperature (F): 98.1 Height (in): 63 Pulse (bpm): 78 Respiratory Rate (breaths/min): 18 Blood Pressure (mmHg): 117/76 Reference Range: 80 - 120 mg / dl Electronic Signature(s) Signed: 06/10/2022 6:08:33 PM By: Baruch Gouty RN, BSN Entered By: Baruch Gouty on 06/10/2022 14:36:55

## 2022-06-24 ENCOUNTER — Encounter (HOSPITAL_BASED_OUTPATIENT_CLINIC_OR_DEPARTMENT_OTHER): Payer: PPO | Attending: General Surgery | Admitting: General Surgery

## 2022-06-24 DIAGNOSIS — L97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: Secondary | ICD-10-CM | POA: Insufficient documentation

## 2022-06-24 DIAGNOSIS — Z923 Personal history of irradiation: Secondary | ICD-10-CM | POA: Insufficient documentation

## 2022-06-24 DIAGNOSIS — C20 Malignant neoplasm of rectum: Secondary | ICD-10-CM | POA: Diagnosis not present

## 2022-06-24 DIAGNOSIS — M1990 Unspecified osteoarthritis, unspecified site: Secondary | ICD-10-CM | POA: Diagnosis not present

## 2022-06-24 DIAGNOSIS — Z9221 Personal history of antineoplastic chemotherapy: Secondary | ICD-10-CM | POA: Insufficient documentation

## 2022-06-24 DIAGNOSIS — E11621 Type 2 diabetes mellitus with foot ulcer: Secondary | ICD-10-CM | POA: Insufficient documentation

## 2022-06-24 DIAGNOSIS — S91302A Unspecified open wound, left foot, initial encounter: Secondary | ICD-10-CM | POA: Diagnosis not present

## 2022-06-24 NOTE — Progress Notes (Signed)
Destiny, [name] ([id number]) Visit Report for 06/24/2022 Arrival Information Details Patient Name: Date of Service: Destiny [name], Destiny [name] 06/24/2022 2:45 PM Medical Record Number: [id number] Patient Account Number: [id number] Date of Birth/Sex: Treating RN: 1945-11-03 (76 y.o. Destiny [name], Destiny [name] Primary Care Destiny Reierson: Deland Pretty Other Clinician: Referring Milliani Herrada: Treating Prajna Vanderpool/Extender: Raylene Everts in Treatment: 5 Visit Information History Since Last Visit Added or deleted any medications: No Patient Arrived: Ambulatory Any new allergies or adverse reactions: No Arrival Time: 14:42 Had a fall or experienced change in No Accompanied By: self activities of daily living that may affect Transfer Assistance: None risk of falls: Patient Identification Verified: Yes Signs or symptoms of abuse/neglect since last visito No Secondary Verification Process Completed: Yes Hospitalized since last visit: No Patient Requires Transmission-Based Precautions: No Implantable device outside of the clinic excluding No Patient Has Alerts: No cellular tissue based products placed in the center since last visit: Has Dressing in Place as Prescribed: Yes Pain Present Now: No Electronic Signature(s) Signed: 06/24/2022 3:23:22 PM By: Adline Peals Entered By: Adline Peals on 06/24/2022 14:45:09 -------------------------------------------------------------------------------- Encounter Discharge Information Details Patient Name: Date of Service: Destiny [name]. 06/24/2022 2:45 PM Medical Record Number: [id number] Patient Account Number: [id number] Date of Birth/Sex: Treating RN: September 25, 1946 (76 y.o. Destiny [name] Primary Care Jenavive Lamboy: Deland Pretty Other Clinician: Referring Alishea Beaudin: Treating Negar Sieler/Extender: Raylene Everts in Treatment: 5 Encounter Discharge Information Items Post Procedure Vitals Discharge Condition:  Stable Temperature (F): 98.2 Ambulatory Status: Ambulatory Pulse (bpm): 84 Discharge Destination: Home Respiratory Rate (breaths/min): 18 Transportation: Private Auto Blood Pressure (mmHg): 123/77 Accompanied By: self Schedule Follow-up Appointment: Yes Clinical Summary of Care: Patient Declined Electronic Signature(s) Signed: 06/24/2022 3:23:22 PM By: Adline Peals Entered By: Adline Peals on 06/24/2022 15:12:20 -------------------------------------------------------------------------------- Lower Extremity Assessment Details Patient Name: Date of Service: Destiny, [name] 06/24/2022 2:45 PM Medical Record Number: [id number] Patient Account Number: [id number] Date of Birth/Sex: Treating RN: Jul 11, 1946 (76 y.o. Destiny [name] Primary Care Samie Reasons: Deland Pretty Other Clinician: Referring Haruko Mersch: Treating Reylynn Vanalstine/Extender: Raylene Everts in Treatment: 5 Edema Assessment Assessed: [name]: No] [Right: No] Edema: [Left: N] [Right: o] Calf Left: Right: Point of Measurement: 29 cm From Medial Instep 36.1 cm Ankle Left: Right: Point of Measurement: 10 cm From Medial Instep 21.4 cm Vascular Assessment Pulses: Dorsalis Pedis Palpable: [Left:Yes] Electronic Signature(s) Signed: 06/24/2022 3:23:22 PM By: Adline Peals Entered By: Adline Peals on 06/24/2022 14:47:16 -------------------------------------------------------------------------------- Multi Wound Chart Details Patient Name: Date of Service: Destiny [name] 06/24/2022 2:45 PM Medical Record Number: [id number] Patient Account Number: [id number] Date of Birth/Sex: Treating RN: 10/04/1946 (76 y.o. F) Primary Care Destiny [name]: Deland Pretty Other Clinician: Referring Bonnee Zertuche: Treating Tavaras Goody/Extender: Raylene Everts in Treatment: 5 Vital Signs Height(in): 63 Pulse(bpm): 8 Weight(lbs): Blood Pressure(mmHg): 123/77 Body Mass  Index(BMI): Temperature(F): 98.2 Respiratory Rate(breaths/min): 18 Photos: [1:Left Calcaneus] [N/A:N/A N/A] Wound Location: [1:Hematoma] [N/A:N/A] Wounding Event: [1:Trauma, Other] [N/A:N/A] Primary Etiology: [1:Type II Diabetes, Osteoarthritis] [N/A:N/A] Comorbid History: [1:05/05/2022] [N/A:N/A] Date Acquired: [1:5] [N/A:N/A] Weeks of Treatment: [1:Open] [N/A:N/A] Wound Status: [1:No] [N/A:N/A] Wound Recurrence: [1:0.5x0.4x0.1] [N/A:N/A] Measurements L x W x D (cm) [1:0.157] [N/A:N/A] A (cm) : rea [1:0.016] [N/A:N/A] Volume (cm) : [1:97.70%] [N/A:N/A] % Reduction in A rea: [1:98.80%] [N/A:N/A] % Reduction in Volume: [1:Full Thickness Without Exposed] [N/A:N/A] Classification: [1:Support Structures Medium] [N/A:N/A] Exudate A mount: [1:Serosanguineous] [N/A:N/A] Exudate Type: [1:red, brown] [N/A:N/A] Exudate Color: [1:Flat and Intact] [N/A:N/A] Wound Margin: [1:Large (67-100%)] [N/A:N/A] Granulation A  mount: [1:Red] [N/A:N/A] Granulation Quality: [1:None Present (0%)] [N/A:N/A] Necrotic A mount: [1:Fat Layer (Subcutaneous Tissue): Yes N/A] Exposed Structures: [1:Fascia: No Tendon: No Muscle: No Joint: No Bone: No Medium (34-66%)] [N/A:N/A] Epithelialization: [1:Debridement - Selective/Open Wound N/A] Debridement: Pre-procedure Verification/Time Out 14:51 [N/A:N/A] Taken: [1:Lidocaine 4% Topical Solution] [N/A:N/A] Pain Control: [1:Necrotic/Eschar, Callus] [N/A:N/A] Tissue Debrided: [1:Non-Viable Tissue] [N/A:N/A] Level: [1:0.2] [N/A:N/A] Debridement A (sq cm): [1:rea Curette] [N/A:N/A] Instrument: [1:Minimum] [N/A:N/A] Bleeding: [1:Pressure] [N/A:N/A] Hemostasis A chieved: [1:0] [N/A:N/A] Procedural Pain: [1:0] [N/A:N/A] Post Procedural Pain: [1:Procedure was tolerated well] [N/A:N/A] Debridement Treatment Response: [1:0.5x0.4x0.1] [N/A:N/A] Post Debridement Measurements L x W x D (cm) [1:0.016] [N/A:N/A] Post Debridement Volume: (cm) [1:Debridement]  [N/A:N/A] Treatment Notes Electronic Signature(s) Signed: 06/24/2022 2:56:20 PM By: Fredirick Maudlin MD FACS Entered By: Fredirick Maudlin on 06/24/2022 14:56:19 -------------------------------------------------------------------------------- Multi-Disciplinary Care Plan Details Patient Name: Date of Service: Destiny [name]. 06/24/2022 2:45 PM Medical Record Number: [id number] Patient Account Number: [id number] Date of Birth/Sex: Treating RN: Jan 21, 1946 (76 y.o. Destiny [name] Primary Care Tandy Lewin: Deland Pretty Other Clinician: Referring Humna Moorehouse: Treating Taron Conrey/Extender: Raylene Everts in Treatment: [place] reviewed with physician Active Inactive Wound/Skin Impairment Nursing Diagnoses: Impaired tissue integrity Goals: Patient/caregiver will verbalize understanding of skin care regimen Date Initiated: 05/19/2022 Target Resolution Date: 07/12/2022 Goal Status: Active Interventions: Assess ulceration(s) every visit Treatment Activities: Skin care regimen initiated : 05/19/2022 Notes: Electronic Signature(s) Signed: 06/24/2022 3:23:22 PM By: Adline Peals Entered By: Adline Peals on 06/24/2022 14:49:39 -------------------------------------------------------------------------------- Pain Assessment Details Patient Name: Date of Service: Destiny [name] 06/24/2022 2:45 PM Medical Record Number: [id number] Patient Account Number: [id number] Date of Birth/Sex: Treating RN: November 27, 1945 (76 y.o. Destiny [name] Primary Care Gjon Letarte: Deland Pretty Other Clinician: Referring Matson Welch: Treating Mychele Seyller/Extender: Raylene Everts in Treatment: 5 Active Problems Location of Pain Severity and Description of Pain Patient Has Paino No Site Locations Rate the pain. Current Pain Level: 0 Pain Management and Medication Current Pain Management: Electronic Signature(s) Signed: 06/24/2022  3:23:22 PM By: Adline Peals Entered By: Adline Peals on 06/24/2022 14:45:40 -------------------------------------------------------------------------------- Patient/Caregiver Education Details Patient Name: Date of Service: Destiny [name] 9/12/2023andnbsp2:45 PM Medical Record Number: [id number] Patient Account Number: [id number] Date of Birth/Gender: Treating RN: 24-May-1946 (76 y.o. Destiny [name] Primary Care Physician: Deland Pretty Other Clinician: Referring Physician: Treating Physician/Extender: Raylene Everts in Treatment: 5 Education Assessment Education Provided To: Patient Education Topics Provided Wound/Skin Impairment: Methods: Explain/Verbal Responses: Reinforcements needed, State content correctly Electronic Signature(s) Signed: 06/24/2022 3:23:22 PM By: Adline Peals Entered By: Adline Peals on 06/24/2022 14:49:50 -------------------------------------------------------------------------------- Wound Assessment Details Patient Name: Date of Service: Destiny [name], Destiny [name] 06/24/2022 2:45 PM Medical Record Number: [id number] Patient Account Number: [id number] Date of Birth/Sex: Treating RN: 08-Oct-1946 (76 y.o. Destiny [name] Primary Care Vinie Charity: Deland Pretty Other Clinician: Referring Shrinika Blatz: Treating Aarit Kashuba/Extender: Raylene Everts in Treatment: 5 Wound Status Wound Number: 1 Primary Etiology: Trauma, Other Wound Location: Left Calcaneus Wound Status: Open Wounding Event: Hematoma Comorbid History: Type II Diabetes, Osteoarthritis Date Acquired: 05/05/2022 Weeks Of Treatment: 5 Clustered Wound: No Photos Wound Measurements Length: (cm) 0.5 Width: (cm) 0.4 Depth: (cm) 0.1 Area: (cm) 0.157 Volume: (cm) 0.016 % Reduction in Area: 97.7% % Reduction in Volume: 98.8% Epithelialization: Medium (34-66%) Tunneling: No Undermining: No Wound  Description Classification: Full Thickness Without Exposed Support Structures Wound Margin: Flat and Intact Exudate Amount: Medium Exudate Type: Serosanguineous Exudate Color: red, brown Foul Odor After Cleansing: No Slough/Fibrino  No Wound Bed Granulation Amount: Large (67-100%) Exposed Structure Granulation Quality: Red Fascia Exposed: No Necrotic Amount: None Present (0%) Fat Layer (Subcutaneous Tissue) Exposed: Yes Tendon Exposed: No Muscle Exposed: No Joint Exposed: No Bone Exposed: No Treatment Notes Wound #1 (Calcaneus) Wound Laterality: Left Cleanser Soap and Water Discharge Instruction: May shower and wash wound with dial antibacterial soap and water prior to dressing change. Wound Cleanser Discharge Instruction: Cleanse the wound with wound cleanser prior to applying a clean dressing using gauze sponges, not tissue or cotton balls. Peri-Wound Care Topical Primary Dressing Hydrofera Blue Ready Foam, 2.5 x2.5 in Discharge Instruction: Apply to wound bed as instructed Secondary Dressing Woven Gauze Sponge, Non-Sterile 4x4 in Discharge Instruction: Apply over primary dressing as directed. Secured With Elastic Bandage 4 inch (ACE bandage) Discharge Instruction: Secure with ACE bandage as directed. Kerlix Roll Sterile, 4.5x3.1 (in/yd) Discharge Instruction: Secure with Kerlix as directed. 18M Medipore Soft Cloth Surgical T 2x10 (in/yd) ape Discharge Instruction: Secure with tape as directed. Compression Wrap Compression Stockings Add-Ons Electronic Signature(s) Signed: 06/24/2022 3:23:22 PM By: Adline Peals Entered By: Adline Peals on 06/24/2022 14:48:58 -------------------------------------------------------------------------------- Vitals Details Patient Name: Date of Service: Destiny [name]. 06/24/2022 2:45 PM Medical Record Number: [id number] Patient Account Number: [id number] Date of Birth/Sex: Treating RN: [birth year] (76 y.o. Destiny [name] Primary Care Fitz Matsuo: Deland Pretty Other Clinician: Referring Jataya Wann: Treating Ayuub Penley/Extender: Raylene Everts in Treatment: 5 Vital Signs Time Taken: 14:45 Temperature (F): 98.2 Height (in): 63 Pulse (bpm): 84 Respiratory Rate (breaths/min): 18 Blood Pressure (mmHg): 123/77 Reference Range: 80 - 120 mg / dl Electronic Signature(s) Signed: 06/24/2022 3:23:22 PM By: Adline Peals Entered By: Adline Peals on 06/24/2022 14:45:27

## 2022-06-24 NOTE — Progress Notes (Signed)
ILIZA, BLANKENBECKLER ([id number]) Visit Report for 06/24/2022 Chief Complaint Document Details Patient Name: Date of Service: Destiny, [name] 06/24/2022 2:45 PM Medical Record Number: [id number] Patient Account Number: [id number] Date of Birth/Sex: Treating RN: [birth year] (76 y.o. F) Primary Care Provider: Deland Pretty Other Clinician: Referring Provider: Treating Provider/Extender: Raylene Everts in Treatment: 5 Information Obtained from: Patient Chief Complaint Patient presents to the wound care center with open non-healing surgical wound(s) Electronic Signature(s) Signed: 06/24/2022 2:57:39 PM By: Fredirick Maudlin MD FACS Entered By: Fredirick Maudlin on 06/24/2022 14:57:39 -------------------------------------------------------------------------------- Debridement Details Patient Name: Date of Service: Destiny [name]. 06/24/2022 2:45 PM Medical Record Number: [id number] Patient Account Number: [id number] Date of Birth/Sex: Treating RN: 1945-10-28 (76 y.o. Harlow Ohms Primary Care Provider: Deland Pretty Other Clinician: Referring Provider: Treating Provider/Extender: Raylene Everts in Treatment: 5 Debridement Performed for Assessment: Wound #1 Left Calcaneus Performed By: Physician Fredirick Maudlin, MD Debridement Type: Debridement Level of Consciousness (Pre-procedure): Awake and Alert Pre-procedure Verification/Time Out Yes - 14:51 Taken: Start Time: 14:51 Pain Control: Lidocaine 4% T opical Solution T Area Debrided (L x W): otal 0.5 (cm) x 0.4 (cm) = 0.2 (cm) Tissue and other material debrided: Non-Viable, Callus, Eschar Level: Non-Viable Tissue Debridement Description: Selective/Open Wound Instrument: Curette Bleeding: Minimum Hemostasis Achieved: Pressure Procedural Pain: 0 Post Procedural Pain: 0 Response to Treatment: Procedure was tolerated well Level of Consciousness (Post- Awake and  Alert procedure): Post Debridement Measurements of Total Wound Length: (cm) 0.5 Width: (cm) 0.4 Depth: (cm) 0.1 Volume: (cm) 0.016 Character of Wound/Ulcer Post Debridement: Improved Post Procedure Diagnosis Same as Pre-procedure Notes scribed for Dr. Celine Ahr by Adline Peals, RN Electronic Signature(s) Signed: 06/24/2022 3:23:22 PM By: Adline Peals Signed: 06/24/2022 3:37:53 PM By: Fredirick Maudlin MD FACS Entered By: Adline Peals on 06/24/2022 15:11:44 -------------------------------------------------------------------------------- HPI Details Patient Name: Date of Service: Destiny Dark F. 06/24/2022 2:45 PM Medical Record Number: [id number] Patient Account Number: [id number] Date of Birth/Sex: Treating RN: 1945-11-26 (76 y.o. 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 After the final reexcision, the wound had an ACell graft applied. She reports that she had good closure of skin over the site. Earlier this year, at the end of May, however, she was diagnosed with rectal cancer and is currently undergoing neoadjuvant therapy (chemotherapy and radiation) in anticipation of future surgery. She reports that about 2 weeks ago, she noted that her skin coverage over the heel was beginning to break down. Her oncologist felt that perhaps the Xeloda was contributing to wound failure and stopped this medication. She continues to receive neoadjuvant radiation therapy, however. She saw plastic surgery last week, and they referred her to the wound care center for further evaluation and management. She is a type II diabetic, well controlled, with the most recent A1c available to me being 5.9%.  ABIs done 1 year ago were normal. On the patient's left heel, there is an irregular wound with some skin maceration. The fat layer is exposed and there is slough over the entire surface. Outside of the area of maceration, the skin is in good condition. No erythema, induration, or odor. No concern for infection. 05/26/2022: The wound is smaller today and is extremely clean, without any slough buildup. The periwound skin is  in much better condition and is no longer macerated. She is scheduled to complete her radiation treatment on Wednesday. 06/03/2022: The wound continues to contract. There is just a little bit of eschar and callus accumulation. The surface is clean. She has completed all of her neoadjuvant therapy and is awaiting surgery in November. 06/10/2022: The wound is smaller by about a centimeter in all dimensions. She does have a little bit of eschar near the Achilles portion of the wound. The wound surface itself is robust and healthy-appearing. 06/24/2022: The wound continues to contract. There is just a small portion open in the center. She does have some dry skin and periwound callus circumferentially. No concern for infection. Electronic Signature(s) Signed: 06/24/2022 2:58:12 PM By: Fredirick Maudlin MD FACS Entered By: Fredirick Maudlin on 06/24/2022 14:58:12 -------------------------------------------------------------------------------- Physical Exam Details Patient Name: Date of Service: Destiny [name], Destiny TTIE F. 06/24/2022 2:45 PM Medical Record Number: [id number] Patient Account Number: [id number] Date of Birth/Sex: Treating RN: 03-28-1946 (76 y.o. F) Primary Care Provider: Deland Pretty Other Clinician: Referring Provider: Treating Provider/Extender: Raylene Everts in Treatment: 5 Constitutional . . . . No acute distress.Marland Kitchen Respiratory Normal work of breathing on room air.. Notes 06/24/2022: The wound continues to contract. There is just a small portion open  in the center. She does have some dry skin and periwound callus circumferentially. No concern for infection. Electronic Signature(s) Signed: 06/24/2022 2:58:36 PM By: Fredirick Maudlin MD FACS Entered By: Fredirick Maudlin on 06/24/2022 14:58:35 -------------------------------------------------------------------------------- Physician Orders Details Patient Name: Date of Service: Destiny [name] 06/24/2022 2:45 PM Medical Record Number: [id number] Patient Account Number: [id number] Date of Birth/Sex: Treating RN: 02/19/1946 (76 y.o. Harlow Ohms Primary Care Provider: Deland Pretty Other Clinician: Referring Provider: Treating Provider/Extender: Raylene Everts in Treatment: 5 Verbal / Phone Orders: No Diagnosis Coding ICD-10 Coding Code Description [phone] Non-pressure chronic ulcer of left heel and midfoot with fat layer exposed T81.31XS Disruption of external operation (surgical) wound, not elsewhere classified, sequela C43.9 Malignant melanoma of skin, unspecified C20 Malignant neoplasm of rectum E11.9 Type 2 diabetes mellitus without complications X51.70 Personal history of antineoplastic chemotherapy Z92.3 Personal history of irradiation Follow-up Appointments ppointment in 2 weeks. - Dr. Celine Ahr - room 1 Return A Anesthetic Wound #1 Left Calcaneus (In clinic) Topical Lidocaine 4% applied to wound bed Bathing/ Shower/ Hygiene Other Bathing/Shower/Hygiene Orders/Instructions: - Change wound dressing after bathing Edema Control - Lymphedema / SCD / Other Avoid standing for long periods of time. Moisturize legs daily. Off-Loading Other: - Please wear off-loading shoe on left foot to keep pressure off the heel Wound Treatment Wound #1 - Calcaneus Wound Laterality: Left Cleanser: Soap and Water 1 x Per Day/30 Days Discharge Instructions: May shower and wash wound with dial antibacterial soap and water prior to dressing change. Cleanser: Wound  Cleanser 1 x Per Day/30 Days Discharge Instructions: Cleanse the wound with wound cleanser prior to applying a clean dressing using gauze sponges, not tissue or cotton balls. Prim Dressing: Hydrofera Blue Ready Foam, 2.5 x2.5 in (Generic) 1 x Per Day/30 Days ary Discharge Instructions: Apply to wound bed as instructed Secondary Dressing: Woven Gauze Sponge, Non-Sterile 4x4 in 1 x Per Day/30 Days Discharge Instructions: Apply over primary dressing as directed. Secured With: Elastic Bandage 4 inch (ACE bandage) 1 x Per Day/30 Days Discharge Instructions: Secure with ACE bandage as directed. Secured With: [employer], 4.5x3.1 (in/yd) 1 x Per Day/30 Days Discharge Instructions: Secure with Kerlix as directed. Secured With:  29M Medipore Soft Cloth Surgical T 2x10 (in/yd) (Generic) 1 x Per Day/30 Days ape Discharge Instructions: Secure with tape as directed. Patient Medications llergies: propofol, aspirin, atorvastatin, Lisinopril, hydrocodone, penicillin A Notifications Medication Indication Start End 06/24/2022 lidocaine DOSE topical 4 % cream - cream topical Electronic Signature(s) Signed: 06/24/2022 3:37:53 PM By: Fredirick Maudlin MD FACS Entered By: Fredirick Maudlin on 06/24/2022 14:58:47 -------------------------------------------------------------------------------- Problem List Details Patient Name: Date of Service: Destiny [name]. 06/24/2022 2:45 PM Medical Record Number: [id number] Patient Account Number: [id number] Date of Birth/Sex: Treating RN: 1946/06/19 (76 y.o. F) Primary Care Provider: Deland Pretty Other Clinician: Referring Provider: Treating Provider/Extender: Raylene Everts in Treatment: 5 Active Problems ICD-10 Encounter Code Description Active Date MDM Diagnosis [phone] Non-pressure chronic ulcer of left heel and midfoot with fat layer exposed 05/19/2022 No Yes T81.31XS Disruption of external operation (surgical) wound, not  elsewhere classified, 05/19/2022 No Yes sequela C43.9 Malignant melanoma of skin, unspecified 05/19/2022 No Yes C20 Malignant neoplasm of rectum 05/19/2022 No Yes E11.9 Type 2 diabetes mellitus without complications 0/06/3817 No Yes Z92.21 Personal history of antineoplastic chemotherapy 05/19/2022 No Yes Z92.3 Personal history of irradiation 05/19/2022 No Yes Inactive Problems Resolved Problems Electronic Signature(s) Signed: 06/24/2022 2:56:10 PM By: Fredirick Maudlin MD FACS Entered By: Fredirick Maudlin on 06/24/2022 14:56:10 -------------------------------------------------------------------------------- Progress Note Details Patient Name: Date of Service: Destiny Dark F. 06/24/2022 2:45 PM Medical Record Number: [id number] Patient Account Number: [id number] Date of Birth/Sex: Treating RN: 06-Jul-1946 (76 y.o. 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 After the final reexcision, the wound had an ACell graft applied. She reports that she had good closure of skin over the site. Earlier this year, at the end of May, however, she was diagnosed with rectal cancer and is currently undergoing neoadjuvant therapy (chemotherapy and radiation) in anticipation of future surgery. She reports that about 2 weeks ago, she noted that her skin coverage over the heel was beginning to break down. Her oncologist felt that perhaps the Xeloda was contributing to wound failure and stopped this medication. She continues to receive neoadjuvant radiation therapy, however. She saw plastic surgery last  week, and they referred her to the wound care center for further evaluation and management. She is a type II diabetic, well controlled, with the most recent A1c available to me being 5.9%. ABIs done 1 year ago were normal. On the patient's left heel, there is an irregular wound with some skin maceration. The fat layer is exposed and there is slough over the entire surface. Outside of the area of maceration, the skin is in good condition. No erythema, induration, or odor. No concern for infection. 05/26/2022: The wound is smaller today and is extremely clean, without any slough buildup. The periwound skin is in much better condition and is no longer macerated. She is scheduled to complete her radiation treatment on Wednesday. 06/03/2022: The wound continues to contract. There is just a little bit of eschar and callus accumulation. The surface is clean. She has completed all of her neoadjuvant therapy and is awaiting surgery in November. 06/10/2022: The wound is smaller by about a centimeter in all dimensions. She does have a little  bit of eschar near the Achilles portion of the wound. The wound surface itself is robust and healthy-appearing. 06/24/2022: The wound continues to contract. There is just a small portion open in the center. She does have some dry skin and periwound callus circumferentially. No concern for infection. Patient History Information obtained from Patient. Social History Former smoker - 1991, Marital Status - Divorced, Alcohol Use - Never, Drug Use - No History, Caffeine Use - Moderate - Diet coke,coffee. Medical History Endocrine Patient has history of Type II Diabetes - Metformin pillsand Ozempic Musculoskeletal Patient has history of Osteoarthritis Hospitalization/Surgery History - Melanoma excision to left heel;ORIF Left ankle fracture;Marland Kitchen Medical A Surgical History Notes nd Eyes Macular Degeneration Ear/Nose/Mouth/Throat Tinnitus Oncologic 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 has  had Chemotherapy (stopped) and Radiation -8 treatments left as of 05/19/22 Rectal cancer Psychiatric Hx Depression Objective Constitutional No acute distress.. Vitals Time Taken: 2:45 PM, Height: 63 in, Temperature: 98.2 F, Pulse: 84 bpm, Respiratory Rate: 18 breaths/min, Blood Pressure: 123/77 mmHg. Respiratory Normal work of breathing on room air.. General Notes: 06/24/2022: The wound continues to contract. There is just a small portion open in the center. She does have some dry skin and periwound callus circumferentially. No concern for infection. Integumentary (Hair, Skin) Wound #1 status is Open. Original cause of wound was Hematoma. The date acquired was: 05/05/2022. The wound has been in treatment 5 weeks. The wound is located on the Left Calcaneus. The wound measures 0.5cm length x 0.4cm width x 0.1cm depth; 0.157cm^2 area and 0.016cm^3 volume. There is Fat Layer (Subcutaneous Tissue) exposed. There is no tunneling or undermining noted. There is a medium amount of serosanguineous drainage noted. The wound margin is flat and intact. There is large (67-100%) red granulation within the wound bed. There is no necrotic tissue within the wound bed. Assessment Active Problems ICD-10 Non-pressure chronic ulcer of left heel and midfoot with fat layer exposed Disruption of external operation (surgical) wound, not elsewhere classified, sequela Malignant melanoma of skin, unspecified Malignant neoplasm of rectum Type 2 diabetes mellitus without complications Personal history of antineoplastic chemotherapy Personal history of irradiation Procedures Wound #1 Pre-procedure diagnosis of Wound #1 is a Trauma, Other located on the Left Calcaneus . There was a Selective/Open Wound Non-Viable Tissue Debridement with a total area of 0.2 sq cm performed by Fredirick Maudlin, MD. With the following instrument(s): Curette to remove Non-Viable tissue/material. Material removed includes Eschar and Callus  and after achieving pain control using Lidocaine 4% T opical Solution. No specimens were taken. A time out was conducted at 14:51, prior to the start of the procedure. A Minimum amount of bleeding was controlled with Pressure. The procedure was tolerated well with a pain level of 0 throughout and a pain level of 0 following the procedure. Post Debridement Measurements: 0.5cm length x 0.4cm width x 0.1cm depth; 0.016cm^3 volume. Character of Wound/Ulcer Post Debridement is improved. Post procedure Diagnosis Wound #1: Same as Pre-Procedure Plan Follow-up Appointments: Return Appointment in 2 weeks. - Dr. Celine Ahr - room 1 Anesthetic: Wound #1 Left Calcaneus: (In clinic) Topical Lidocaine 4% applied to wound bed Bathing/ Shower/ Hygiene: Other Bathing/Shower/Hygiene Orders/Instructions: - Change wound dressing after bathing Edema Control - Lymphedema / SCD / Other: Avoid standing for long periods of time. Moisturize legs daily. Off-Loading: Other: - Please wear off-loading shoe on left foot to keep pressure off the heel The following medication(s) was prescribed: lidocaine topical 4 % cream cream topical was prescribed at facility WOUND #1: - Calcaneus Wound Laterality:  Left Cleanser: Soap and Water 1 x Per Day/30 Days Discharge Instructions: May shower and wash wound with dial antibacterial soap and water prior to dressing change. Cleanser: Wound Cleanser 1 x Per Day/30 Days Discharge Instructions: Cleanse the wound with wound cleanser prior to applying a clean dressing using gauze sponges, not tissue or cotton balls. Prim Dressing: Hydrofera Blue Ready Foam, 2.5 x2.5 in (Generic) 1 x Per Day/30 Days ary Discharge Instructions: Apply to wound bed as instructed Secondary Dressing: Woven Gauze Sponge, Non-Sterile 4x4 in 1 x Per Day/30 Days Discharge Instructions: Apply over primary dressing as directed. Secured With: Elastic Bandage 4 inch (ACE bandage) 1 x Per Day/30 Days Discharge  Instructions: Secure with ACE bandage as directed. Secured With: [employer], 4.5x3.1 (in/yd) 1 x Per Day/30 Days Discharge Instructions: Secure with Kerlix as directed. Secured With: 50M Medipore Public affairs consultant Surgical T 2x10 (in/yd) (Generic) 1 x Per Day/30 Days ape Discharge Instructions: Secure with tape as directed. 06/24/2022: The wound continues to contract. There is just a small portion open in the center. She does have some dry skin and periwound callus circumferentially. No concern for infection. I used a curette to debride the callus and dry dead skin from around the wound. We will continue to use Hydrofera Blue with Kerlix and Ace bandage. She will continue to wear her heel offloading shoe. Follow-up in 2 weeks. Electronic Signature(s) Signed: 06/24/2022 2:59:15 PM By: Fredirick Maudlin MD FACS Entered By: Fredirick Maudlin on 06/24/2022 14:59:14 -------------------------------------------------------------------------------- HxROS Details Patient Name: Date of Service: Destiny Dark F. 06/24/2022 2:45 PM Medical Record Number: [id number] Patient Account Number: [id number] Date of Birth/Sex: Treating RN: Nov 09, 1945 (76 y.o. F) Primary Care Provider: Deland Pretty Other Clinician: Referring Provider: Treating Provider/Extender: Raylene Everts in Treatment: 5 Information Obtained From Patient Eyes Medical History: Past Medical History Notes: Macular Degeneration Ear/Nose/Mouth/Throat Medical History: Past Medical History Notes: Tinnitus Endocrine Medical History: Positive for: Type II Diabetes - Metformin pillsand Ozempic Time with diabetes: 25 years Treated with: Oral agents Blood sugar tested every day: Yes Tested : every day Musculoskeletal Medical History: Positive for: Osteoarthritis Oncologic Medical History: Past Medical History Notes: 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 has had Chemotherapy (stopped) and Radiation -8 treatments left as of  05/19/22 Rectal cancer Psychiatric Medical History: Past Medical History Notes: Hx Depression Immunizations Pneumococcal Vaccine: Received Pneumococcal Vaccination: Yes Received Pneumococcal Vaccination On or After 60th Birthday: Yes Implantable Devices None Hospitalization / Surgery History Type of Hospitalization/Surgery Melanoma excision to left heel;ORIF Left ankle fracture; Family and Social History Former smoker - 1991; Marital Status - Divorced; Alcohol Use: Never; Drug Use: No History; Caffeine Use: Moderate - Diet coke,coffee; Financial Concerns: No; Food, Clothing or Shelter Needs: No; Support System Lacking: No; Transportation Concerns: No Electronic Signature(s) Signed: 06/24/2022 3:37:53 PM By: Fredirick Maudlin MD FACS Entered By: Fredirick Maudlin on 06/24/2022 14:58:17 -------------------------------------------------------------------------------- SuperBill Details Patient Name: Date of Service: Destiny [name] 06/24/2022 Medical Record Number: [id number] Patient Account Number: [id number] Date of Birth/Sex: Treating RN: 1946/01/21 (76 y.o. F) Primary Care Provider: Deland Pretty Other Clinician: Referring Provider: Treating Provider/Extender: Raylene Everts in Treatment: 5 Diagnosis Coding ICD-10 Codes Code Description [phone] Non-pressure chronic ulcer of left heel and midfoot with fat layer exposed T81.31XS Disruption of external operation (surgical) wound, not elsewhere classified, sequela C43.9 Malignant melanoma of skin, unspecified C20 Malignant neoplasm of rectum E11.9 Type 2 diabetes mellitus without complications S85.46 Personal history of antineoplastic chemotherapy Z92.3 Personal history of  irradiation Facility Procedures CPT4 Code: [id number] Description: [phone] - DEBRIDE WOUND 1ST 20 SQ CM OR < ICD-10 Diagnosis Description L97.422 Non-pressure chronic ulcer of left heel and midfoot with fat layer  exposed Modifier: Quantity: 1 Physician Procedures : CPT4 Code Description Modifier [id number] - WC PHYS LEVEL 3 - EST PT 25 ICD-10 Diagnosis Description L97.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 T81.31XS Disruption of external operation (surgical) wound, not  elsewhere classified, sequela C43.9 Malignant melanoma of skin, unspecified Z92.21 Personal history of antineoplastic chemotherapy Quantity: 1 : [id number] - WC PHYS DEBR WO ANESTH 20 SQ CM 1 ICD-10 Diagnosis Description L97.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 Quantity: Electronic Signature(s) Signed: 06/24/2022 2:59:33 PM By: Fredirick Maudlin MD FACS Entered By: Fredirick Maudlin on 06/24/2022 14:59:33

## 2022-07-08 ENCOUNTER — Ambulatory Visit
Admission: RE | Admit: 2022-07-08 | Discharge: 2022-07-08 | Disposition: A | Discharge status: Home / Self Care | Payer: PPO | Source: Ambulatory Visit | Attending: Radiation Oncology | Admitting: Radiation Oncology

## 2022-07-08 ENCOUNTER — Encounter (HOSPITAL_BASED_OUTPATIENT_CLINIC_OR_DEPARTMENT_OTHER): Payer: PPO | Admitting: General Surgery

## 2022-07-08 DIAGNOSIS — E11621 Type 2 diabetes mellitus with foot ulcer: Secondary | ICD-10-CM | POA: Diagnosis not present

## 2022-07-08 DIAGNOSIS — L97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: Secondary | ICD-10-CM | POA: Diagnosis not present

## 2022-07-08 DIAGNOSIS — C439 Malignant melanoma of skin, unspecified: Secondary | ICD-10-CM | POA: Diagnosis not present

## 2022-07-08 NOTE — Progress Notes (Signed)
Destiny [name], Destiny [name] ([id number]) Visit Report for 07/08/2022 Arrival Information Details Patient Name: Date of Service: Destiny [name], Destiny [name] 07/08/2022 2:45 PM Medical Record Number: [id number] Patient Account Number: [id number] Date of Birth/Sex: Treating RN: 1945-11-19 (76 y.o. Elam Dutch Primary Care August Gosser: Deland Pretty Other Clinician: Referring Azie Mcconahy: Treating Atasha Colebank/Extender: Raylene Everts in Treatment: 7 Visit Information History Since Last Visit Added or deleted any medications: No Patient Arrived: Ambulatory Any new allergies or adverse reactions: No Arrival Time: 15:11 Had a fall or experienced change in No Accompanied By: self activities of daily living that may affect Transfer Assistance: None risk of falls: Patient Identification Verified: Yes Signs or symptoms of abuse/neglect since last visito No Secondary Verification Process Completed: Yes Hospitalized since last visit: No Patient Requires Transmission-Based Precautions: No Implantable device outside of the clinic excluding No Patient Has Alerts: No cellular tissue based products placed in the center since last visit: Has Dressing in Place as Prescribed: Yes Has Footwear/Offloading in Place as Prescribed: Yes Left: Other:globoped Pain Present Now: No Electronic Signature(s) Signed: 07/08/2022 5:30:40 PM By: Baruch Gouty RN, BSN Entered By: Baruch Gouty on 07/08/2022 15:11:55 -------------------------------------------------------------------------------- Encounter Discharge Information Details Patient Name: Date of Service: [name] 07/08/2022 2:45 PM Medical Record Number: [id number] Patient Account Number: [id number] Date of Birth/Sex: Treating RN: Mar 03, 1946 (76 y.o. Elam Dutch Primary Care Jazmen Lindenbaum: Deland Pretty Other Clinician: Referring Corayma Cashatt: Treating Shamus Desantis/Extender: Raylene Everts in Treatment: 7 Encounter  Discharge Information Items Post Procedure Vitals Discharge Condition: Stable Temperature (F): 98.5 Ambulatory Status: Ambulatory Pulse (bpm): 99 Discharge Destination: Home Respiratory Rate (breaths/min): 18 Transportation: Private Auto Blood Pressure (mmHg): 117/76 Accompanied By: self Schedule Follow-up Appointment: Yes Clinical Summary of Care: Patient Declined Electronic Signature(s) Signed: 07/08/2022 5:30:40 PM By: Baruch Gouty RN, BSN Entered By: Baruch Gouty on 07/08/2022 15:33:07 -------------------------------------------------------------------------------- Lower Extremity Assessment Details Patient Name: Date of Service: [name] 07/08/2022 2:45 PM Medical Record Number: [id number] Patient Account Number: [id number] Date of Birth/Sex: Treating RN: Jul 18, 1946 (76 y.o. Elam Dutch Primary Care Jonan Seufert: Deland Pretty Other Clinician: Referring Pawan Knechtel: Treating Traylon Schimming/Extender: Raylene Everts in Treatment: 7 Edema Assessment Assessed: [Left: No] [Right: No] Edema: [Left: N] [Right: o] Calf Left: Right: Point of Measurement: 29 cm From Medial Instep 36.1 cm Ankle Left: Right: Point of Measurement: 10 cm From Medial Instep 21.4 cm Vascular Assessment Pulses: Dorsalis Pedis Palpable: [Left:Yes] Electronic Signature(s) Signed: 07/08/2022 5:30:40 PM By: Baruch Gouty RN, BSN Entered By: Baruch Gouty on 07/08/2022 15:12:39 -------------------------------------------------------------------------------- Multi Wound Chart Details Patient Name: Date of Service: [name] 07/08/2022 2:45 PM Medical Record Number: [id number] Patient Account Number: [id number] Date of Birth/Sex: Treating RN: 02-03-1946 (76 y.o. Elam Dutch Primary Care Johsua Shevlin: Deland Pretty Other Clinician: Referring Deaglan Lile: Treating Mansa Willers/Extender: Raylene Everts in Treatment: 7 Vital  Signs Height(in): 63 Pulse(bpm): 99 Weight(lbs): Blood Pressure(mmHg): 117/76 Body Mass Index(BMI): Temperature(F): 98.5 Respiratory Rate(breaths/min): 18 Photos: [1:Left Calcaneus] [N/A:N/A N/A] Wound Location: [1:Hematoma] [N/A:N/A] Wounding Event: [1:Trauma, Other] [N/A:N/A] Primary Etiology: [1:Type II Diabetes, Osteoarthritis] [N/A:N/A] Comorbid History: [1:05/05/2022] [N/A:N/A] Date Acquired: [1:7] [N/A:N/A] Weeks of Treatment: [1:Open] [N/A:N/A] Wound Status: [1:No] [N/A:N/A] Wound Recurrence: [1:0.1x0.1x0.1] [N/A:N/A] Measurements L x W x D (cm) [1:0.008] [N/A:N/A] A (cm) : rea [1:0.001] [N/A:N/A] Volume (cm) : [1:99.90%] [N/A:N/A] % Reduction in A rea: [1:99.90%] [N/A:N/A] % Reduction in Volume: [1:Full Thickness Without Exposed] [N/A:N/A] Classification: [1:Support Structures Medium] [N/A:N/A] Exudate A mount: [1:Serosanguineous] [N/A:N/A] Exudate  Type: [1:red, brown] [N/A:N/A] Exudate Color: [1:Flat and Intact] [N/A:N/A] Wound Margin: [1:Large (67-100%)] [N/A:N/A] Granulation A mount: [1:Red] [N/A:N/A] Granulation Quality: [1:None Present (0%)] [N/A:N/A] Necrotic A mount: [1:Fat Layer (Subcutaneous Tissue): Yes N/A] Exposed Structures: [1:Fascia: No Tendon: No Muscle: No Joint: No Bone: No Medium (34-66%)] [N/A:N/A] Epithelialization: [1:Debridement - Selective/Open Wound N/A] Debridement: Pre-procedure Verification/Time Out 15:15 [N/A:N/A] Taken: [1:Lidocaine 4% Topical Solution] [N/A:N/A] Pain Control: [1:Necrotic/Eschar] [N/A:N/A] Tissue Debrided: [1:Non-Viable Tissue] [N/A:N/A] Level: [1:1] [N/A:N/A] Debridement A (sq cm): [1:rea Curette] [N/A:N/A] Instrument: [1:None] [N/A:N/A] Bleeding: [1:0] [N/A:N/A] Procedural Pain: [1:0] [N/A:N/A] Post Procedural Pain: [1:Procedure was tolerated well] [N/A:N/A] Debridement Treatment Response: [1:0.1x0.1x0.1] [N/A:N/A] Post Debridement Measurements L x W x D (cm) [1:0.001] [N/A:N/A] Post Debridement Volume:  (cm) [1:Debridement] [N/A:N/A] Treatment Notes Electronic Signature(s) Signed: 07/08/2022 3:29:20 PM By: Fredirick Maudlin MD FACS Signed: 07/08/2022 5:30:40 PM By: Baruch Gouty RN, BSN Entered By: Fredirick Maudlin on 07/08/2022 15:29:20 -------------------------------------------------------------------------------- Multi-Disciplinary Care Plan Details Patient Name: Date of Service: [name] 07/08/2022 2:45 PM Medical Record Number: [id number] Patient Account Number: [id number] Date of Birth/Sex: Treating RN: 07-26-1946 (76 y.o. Elam Dutch Primary Care Johnross Nabozny: Deland Pretty Other Clinician: Referring Solei Wubben: Treating Damond Borchers/Extender: Raylene Everts in Treatment: [place] reviewed with physician Active Inactive Wound/Skin Impairment Nursing Diagnoses: Impaired tissue integrity Goals: Patient/caregiver will verbalize understanding of skin care regimen Date Initiated: 05/19/2022 Target Resolution Date: 08/05/2022 Goal Status: Active Interventions: Assess ulceration(s) every visit Treatment Activities: Skin care regimen initiated : 05/19/2022 Notes: Electronic Signature(s) Signed: 07/08/2022 5:30:40 PM By: Baruch Gouty RN, BSN Entered By: Baruch Gouty on 07/08/2022 15:19:03 -------------------------------------------------------------------------------- Pain Assessment Details Patient Name: Date of Service: [name] 07/08/2022 2:45 PM Medical Record Number: [id number] Patient Account Number: [id number] Date of Birth/Sex: Treating RN: 01/26/1946 (76 y.o. Elam Dutch Primary Care Jene Oravec: Deland Pretty Other Clinician: Referring Coben Godshall: Treating Virgia Kelner/Extender: Raylene Everts in Treatment: 7 Active Problems Location of Pain Severity and Description of Pain Patient Has Paino No Site Locations Rate the pain. Current Pain Level: 0 Pain Management and  Medication Current Pain Management: Electronic Signature(s) Signed: 07/08/2022 5:30:40 PM By: Baruch Gouty RN, BSN Entered By: Baruch Gouty on 07/08/2022 15:12:31 -------------------------------------------------------------------------------- Patient/Caregiver Education Details Patient Name: Date of Service: [name] 9/26/2023andnbsp2:45 PM Medical Record Number: [id number] Patient Account Number: [id number] Date of Birth/Gender: Treating RN: 10/09/1946 (76 y.o. Elam Dutch Primary Care Physician: Deland Pretty Other Clinician: Referring Physician: Treating Physician/Extender: Raylene Everts in Treatment: 7 Education Assessment Education Provided To: Patient Education Topics Provided Wound/Skin Impairment: Methods: Explain/Verbal Responses: Reinforcements needed, State content correctly [employer]) Signed: 07/08/2022 5:30:40 PM By: Baruch Gouty RN, BSN Entered By: Baruch Gouty on 07/08/2022 15:16:42 -------------------------------------------------------------------------------- Wound Assessment Details Patient Name: Date of Service: [name] 07/08/2022 2:45 PM Medical Record Number: [id number] Patient Account Number: [id number] Date of Birth/Sex: Treating RN: 1946/01/06 (76 y.o. Elam Dutch Primary Care Jaymes Hang: Deland Pretty Other Clinician: Referring Leea Rambeau: Treating Megha Agnes/Extender: Raylene Everts in Treatment: 7 Wound Status Wound Number: 1 Primary Etiology: Trauma, Other Wound Location: Left Calcaneus Wound Status: Open Wounding Event: Hematoma Comorbid History: Type II Diabetes, Osteoarthritis Date Acquired: 05/05/2022 Weeks Of Treatment: 7 Clustered Wound: No Photos Wound Measurements Length: (cm) 0.1 Width: (cm) 0.1 Depth: (cm) 0.1 Area: (cm) 0.008 Volume: (cm) 0.001 % Reduction in Area: 99.9% % Reduction in Volume: 99.9% Epithelialization:  Medium (34-66%) Wound Description Classification: Full Thickness Without Exposed Support  Structures Wound Margin: Flat and Intact Exudate Amount: Medium Exudate Type: Serosanguineous Exudate Color: red, brown Foul Odor After Cleansing: No Slough/Fibrino No Wound Bed Granulation Amount: Large (67-100%) Exposed Structure Granulation Quality: Red Fascia Exposed: No Necrotic Amount: None Present (0%) Fat Layer (Subcutaneous Tissue) Exposed: Yes Tendon Exposed: No Muscle Exposed: No Joint Exposed: No Bone Exposed: No Treatment Notes Wound #1 (Calcaneus) Wound Laterality: Left Cleanser Soap and Water Discharge Instruction: May shower and wash wound with dial antibacterial soap and water prior to dressing change. Wound Cleanser Discharge Instruction: Cleanse the wound with wound cleanser prior to applying a clean dressing using gauze sponges, not tissue or cotton balls. Peri-Wound Care Topical Primary Dressing Hydrofera Blue Ready Foam, 2.5 x2.5 in Discharge Instruction: Apply to wound bed as instructed Secondary Dressing Woven Gauze Sponge, Non-Sterile 4x4 in Discharge Instruction: Apply over primary dressing as directed. Secured With Elastic Bandage 4 inch (ACE bandage) Discharge Instruction: Secure with ACE bandage as directed. Kerlix Roll Sterile, 4.5x3.1 (in/yd) Discharge Instruction: Secure with Kerlix as directed. 88M Medipore Soft Cloth Surgical T 2x10 (in/yd) ape Discharge Instruction: Secure with tape as directed. Compression Wrap Compression Stockings Add-Ons Electronic Signature(s) Signed: 07/08/2022 4:50:18 PM By: Dellie Catholic RN Signed: 07/08/2022 5:30:40 PM By: Baruch Gouty RN, BSN Entered By: Dellie Catholic on 07/08/2022 15:14:30 -------------------------------------------------------------------------------- Vitals Details Patient Name: Date of Service: [name] 07/08/2022 2:45 PM Medical Record Number: [id number] Patient Account Number:  [id number] Date of Birth/Sex: Treating RN: 19-Dec-1945 (76 y.o. Elam Dutch Primary Care Adiana Smelcer: Deland Pretty Other Clinician: Referring Emmanuel Gruenhagen: Treating Tionna Gigante/Extender: Raylene Everts in Treatment: 7 Vital Signs Time Taken: 15:00 Temperature (F): 98.5 Height (in): 63 Pulse (bpm): 99 Respiratory Rate (breaths/min): 18 Blood Pressure (mmHg): 117/76 Reference Range: 80 - 120 mg / dl Electronic Signature(s) Signed: 07/08/2022 5:30:40 PM By: Baruch Gouty RN, BSN Entered By: Baruch Gouty on 07/08/2022 15:12:22

## 2022-07-09 NOTE — Progress Notes (Signed)
Destiny, [name] ([id number]) Visit Report for 07/08/2022 Chief Complaint Document Details Patient Name: Date of Service: Destiny [name], Destiny [name] 07/08/2022 2:45 PM Medical Record Number: [id number] Patient Account Number: [id number] Date of Birth/Sex: Treating RN: Mar 03, 1946 (76 y.o. Elam Dutch Primary Care Provider: Deland Pretty Other Clinician: Referring Provider: Treating Provider/Extender: Raylene Everts in Treatment: 7 Information Obtained from: Patient Chief Complaint Patient presents to the wound care center with open non-healing surgical wound(s) Electronic Signature(s) Signed: 07/08/2022 3:30:51 PM By: Fredirick Maudlin MD FACS Entered By: Fredirick Maudlin on 07/08/2022 15:30:51 -------------------------------------------------------------------------------- Debridement Details Patient Name: Date of Service: Destiny [name] 07/08/2022 2:45 PM Medical Record Number: [id number] Patient Account Number: [id number] Date of Birth/Sex: Treating RN: [birth year] (76 y.o. Elam Dutch Primary Care Provider: Deland Pretty Other Clinician: Referring Provider: Treating Provider/Extender: Raylene Everts in Treatment: 7 Debridement Performed for Assessment: Wound #1 Left Calcaneus Performed By: Physician Fredirick Maudlin, MD Debridement Type: Debridement Level of Consciousness (Pre-procedure): Awake and Alert Pre-procedure Verification/Time Out Yes - 15:15 Taken: Start Time: 15:15 Pain Control: Lidocaine 4% Topical Solution T Area Debrided (L x W): otal 1 (cm) x 1 (cm) = 1 (cm) Tissue and other material debrided: Non-Viable, Eschar Level: Non-Viable Tissue Debridement Description: Selective/Open Wound Instrument: Curette Bleeding: None Procedural Pain: 0 Post Procedural Pain: 0 Response to Treatment: Procedure was tolerated well Level of Consciousness (Post- Awake and Alert procedure): Post Debridement Measurements of  Total Wound Length: (cm) 0.1 Width: (cm) 0.1 Depth: (cm) 0.1 Volume: (cm) 0.001 Character of Wound/Ulcer Post Debridement: Improved Post Procedure Diagnosis Same as Pre-procedure Notes scribed by Baruch Gouty, RN for Dr. Celine Ahr Electronic Signature(s) Signed: 07/08/2022 5:30:40 PM By: Baruch Gouty RN, BSN Signed: 07/09/2022 12:22:25 PM By: Fredirick Maudlin MD FACS Previous Signature: 07/08/2022 3:40:52 PM Version By: Fredirick Maudlin MD FACS Entered By: Baruch Gouty on 07/08/2022 16:31:22 -------------------------------------------------------------------------------- HPI Details Patient Name: Date of Service: Destiny [name]. 07/08/2022 2:45 PM Medical Record Number: [id number] Patient Account Number: [id number] Date of Birth/Sex: Treating RN: Feb 08, 1946 (76 y.o. 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 After the final reexcision, the wound had an ACell graft applied. She reports that she had good closure of skin over the site. Earlier this year, at the end of May, however, she was diagnosed with rectal cancer and is currently undergoing neoadjuvant therapy (chemotherapy and radiation) in anticipation of future surgery. She reports that about 2 weeks ago, she noted that her skin coverage over the heel was beginning to break down. Her oncologist felt that perhaps the Xeloda was contributing to wound failure and stopped this medication. She continues to receive neoadjuvant radiation therapy, however. She saw plastic surgery last week, and they referred her to the wound care center for further evaluation and management. She is a type II diabetic, well controlled, with the  most recent A1c available to me being 5.9%. ABIs done 1 year ago were normal. On the patient's left heel, there is an irregular wound with some skin maceration. The fat layer is exposed and there is slough over the entire surface. Outside of the area of maceration, the skin is in good condition. No erythema, induration, or odor. No concern for infection. 05/26/2022: The wound is smaller today  and is extremely clean, without any slough buildup. The periwound skin is in much better condition and is no longer macerated. She is scheduled to complete her radiation treatment on Wednesday. 06/03/2022: The wound continues to contract. There is just a little bit of eschar and callus accumulation. The surface is clean. She has completed all of her neoadjuvant therapy and is awaiting surgery in November. 06/10/2022: The wound is smaller by about a centimeter in all dimensions. She does have a little bit of eschar near the Achilles portion of the wound. The wound surface itself is robust and healthy-appearing. 06/24/2022: The wound continues to contract. There is just a small portion open in the center. She does have some dry skin and periwound callus circumferentially. No concern for infection. 07/08/2022: The wound is down to just a pinhole. There is a little bit of eschar on the surface. No concern for infection. Electronic Signature(s) Signed: 07/08/2022 3:33:16 PM By: Fredirick Maudlin MD FACS Entered By: Fredirick Maudlin on 07/08/2022 15:33:16 -------------------------------------------------------------------------------- Physical Exam Details Patient Name: Date of Service: Destiny [name], Destiny TTIE F. 07/08/2022 2:45 PM Medical Record Number: [id number] Patient Account Number: [id number] Date of Birth/Sex: Treating RN: [birth year] (76 y.o. Elam Dutch Primary Care Provider: Deland Pretty Other Clinician: Referring Provider: Treating Provider/Extender: Raylene Everts in Treatment:  7 Constitutional . . . . No acute distress.Marland Kitchen Respiratory Normal work of breathing on room air.. Notes 07/08/2022: The wound is down to just a pinhole. There is a little bit of eschar on the surface. No concern for infection. Electronic Signature(s) Signed: 07/08/2022 3:33:44 PM By: Fredirick Maudlin MD FACS Entered By: Fredirick Maudlin on 07/08/2022 15:33:43 -------------------------------------------------------------------------------- Physician Orders Details Patient Name: Date of Service: Destiny [name] 07/08/2022 2:45 PM Medical Record Number: [id number] Patient Account Number: [id number] Date of Birth/Sex: Treating RN: Feb 22, 1946 (76 y.o. Elam Dutch Primary Care Provider: Deland Pretty Other Clinician: Referring Provider: Treating Provider/Extender: Raylene Everts in Treatment: 7 Verbal / Phone Orders: No Diagnosis Coding ICD-10 Coding Code Description [phone] Non-pressure chronic ulcer of left heel and midfoot with fat layer exposed T81.31XS Disruption of external operation (surgical) wound, not elsewhere classified, sequela C43.9 Malignant melanoma of skin, unspecified C20 Malignant neoplasm of rectum E11.9 Type 2 diabetes mellitus without complications M84.13 Personal history of antineoplastic chemotherapy Z92.3 Personal history of irradiation Follow-up Appointments ppointment in 2 weeks. - Dr. Celine Ahr - room 1 Return A Anesthetic Wound #1 Left Calcaneus (In clinic) Topical Lidocaine 4% applied to wound bed Bathing/ Shower/ Hygiene Other Bathing/Shower/Hygiene Orders/Instructions: - Change wound dressing after bathing Edema Control - Lymphedema / SCD / Other Avoid standing for long periods of time. Moisturize legs daily. Off-Loading Other: - Please wear off-loading shoe on left foot to keep pressure off the heel Wound Treatment Wound #1 - Calcaneus Wound Laterality: Left Cleanser: Soap and Water 1 x Per Day/30 Days Discharge  Instructions: May shower and wash wound with dial antibacterial soap and water prior to dressing change. Cleanser: Wound Cleanser 1 x Per Day/30 Days Discharge Instructions: Cleanse the wound with wound cleanser prior to applying a clean dressing using gauze sponges, not tissue or cotton balls. Prim Dressing: Hydrofera Blue Ready Foam, 2.5 x2.5 in (Generic) 1 x Per Day/30 Days ary Discharge Instructions: Apply to wound bed as instructed Secondary Dressing: Woven Gauze Sponge, Non-Sterile 4x4 in 1 x Per Day/30 Days Discharge Instructions: Apply over primary dressing as directed. Secured With: Elastic Bandage 4 inch (ACE bandage) 1 x Per Day/30  Days Discharge Instructions: Secure with ACE bandage as directed. Secured With: [employer], 4.5x3.1 (in/yd) 1 x Per Day/30 Days Discharge Instructions: Secure with Kerlix as directed. Secured With: 62M Medipore Public affairs consultant Surgical T 2x10 (in/yd) (Generic) 1 x Per Day/30 Days ape Discharge Instructions: Secure with tape as directed. Electronic Signature(s) Signed: 07/08/2022 3:40:52 PM By: Fredirick Maudlin MD FACS Entered By: Fredirick Maudlin on 07/08/2022 15:33:55 -------------------------------------------------------------------------------- Problem List Details Patient Name: Date of Service: Destiny [name] 07/08/2022 2:45 PM Medical Record Number: [id number] Patient Account Number: [id number] Date of Birth/Sex: Treating RN: Dec 26, 1945 (76 y.o. Elam Dutch Primary Care Provider: Deland Pretty Other Clinician: Referring Provider: Treating Provider/Extender: Raylene Everts in Treatment: 7 Active Problems ICD-10 Encounter Code Description Active Date MDM Diagnosis [phone] Non-pressure chronic ulcer of left heel and midfoot with fat layer exposed 05/19/2022 No Yes T81.31XS Disruption of external operation (surgical) wound, not elsewhere classified, 05/19/2022 No Yes sequela C43.9 Malignant melanoma of  skin, unspecified 05/19/2022 No Yes C20 Malignant neoplasm of rectum 05/19/2022 No Yes E11.9 Type 2 diabetes mellitus without complications 11/14/1939 No Yes Z92.21 Personal history of antineoplastic chemotherapy 05/19/2022 No Yes Z92.3 Personal history of irradiation 05/19/2022 No Yes Inactive Problems Resolved Problems Electronic Signature(s) Signed: 07/08/2022 3:29:14 PM By: Fredirick Maudlin MD FACS Signed: 07/08/2022 3:29:14 PM By: Fredirick Maudlin MD FACS Entered By: Fredirick Maudlin on 07/08/2022 15:29:14 -------------------------------------------------------------------------------- Progress Note Details Patient Name: Date of Service: Destiny [name] 07/08/2022 2:45 PM Medical Record Number: [id number] Patient Account Number: [id number] Date of Birth/Sex: Treating RN: [birth year] (76 y.o. 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 After the final reexcision, the wound had an ACell graft applied. She reports that she had good closure of skin over the site. Earlier this year, at the end of May, however, she was diagnosed with rectal cancer and is currently undergoing neoadjuvant therapy (chemotherapy and radiation) in anticipation of future surgery. She reports that about 2 weeks ago, she noted that her skin coverage over the heel was beginning to break down. Her oncologist felt that perhaps the Xeloda was contributing to wound failure and stopped this medication. She continues to receive neoadjuvant radiation therapy, however. She saw plastic surgery last  week, and they referred her to the wound care center for further evaluation and management. She is a type II diabetic, well controlled, with the most recent A1c available to me being 5.9%. ABIs done 1 year ago were normal. On the patient's left heel, there is an irregular wound with some skin maceration. The fat layer is exposed and there is slough over the entire surface. Outside of the area of maceration, the skin is in good condition. No erythema, induration, or odor. No concern for infection. 05/26/2022: The wound is smaller today and is extremely clean, without any slough buildup. The periwound skin is in much better condition and is no longer macerated. She is scheduled to complete her radiation treatment on Wednesday. 06/03/2022: The wound continues to contract. There is just a little bit of eschar and callus accumulation. The surface is clean. She has completed all of her neoadjuvant therapy and is awaiting surgery in  November. 06/10/2022: The wound is smaller by about a centimeter in all dimensions. She does have a little bit of eschar near the Achilles portion of the wound. The wound surface itself is robust and healthy-appearing. 06/24/2022: The wound continues to contract. There is just a small portion open in the center. She does have some dry skin and periwound callus circumferentially. No concern for infection. 07/08/2022: The wound is down to just a pinhole. There is a little bit of eschar on the surface. No concern for infection. Patient History Information obtained from Patient. Social History Former smoker - 1991, Marital Status - Divorced, Alcohol Use - Never, Drug Use - No History, Caffeine Use - Moderate - Diet coke,coffee. Medical History Endocrine Patient has history of Type II Diabetes - Metformin pillsand Ozempic Musculoskeletal Patient has history of Osteoarthritis Hospitalization/Surgery History - Melanoma excision to left heel;ORIF Left ankle fracture;Marland Kitchen Medical A  Surgical History Notes nd Eyes Macular Degeneration Ear/Nose/Mouth/Throat Tinnitus Oncologic 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 has had Chemotherapy (stopped) and Radiation -8 treatments left as of 05/19/22 Rectal cancer Psychiatric Hx Depression Objective Constitutional No acute distress.. Vitals Time Taken: 3:00 PM, Height: 63 in, Temperature: 98.5 F, Pulse: 99 bpm, Respiratory Rate: 18 breaths/min, Blood Pressure: 117/76 mmHg. Respiratory Normal work of breathing on room air.. General Notes: 07/08/2022: The wound is down to just a pinhole. There is a little bit of eschar on the surface. No concern for infection. Integumentary (Hair, Skin) Wound #1 status is Open. Original cause of wound was Hematoma. The date acquired was: 05/05/2022. The wound has been in treatment 7 weeks. The wound is located on the Left Calcaneus. The wound measures 0.1cm length x 0.1cm width x 0.1cm depth; 0.008cm^2 area and 0.001cm^3 volume. There is Fat Layer (Subcutaneous Tissue) exposed. There is a medium amount of serosanguineous drainage noted. The wound margin is flat and intact. There is large (67-100%) red granulation within the wound bed. There is no necrotic tissue within the wound bed. Assessment Active Problems ICD-10 Non-pressure chronic ulcer of left heel and midfoot with fat layer exposed Disruption of external operation (surgical) wound, not elsewhere classified, sequela Malignant melanoma of skin, unspecified Malignant neoplasm of rectum Type 2 diabetes mellitus without complications Personal history of antineoplastic chemotherapy Personal history of irradiation Procedures Wound #1 Pre-procedure diagnosis of Wound #1 is a Trauma, Other located on the Left Calcaneus . There was a Selective/Open Wound Non-Viable Tissue Debridement with a total area of 1 sq cm performed by Fredirick Maudlin, MD. With the following instrument(s): Curette to remove Non-Viable tissue/material. Material removed includes Eschar  after achieving pain control using Lidocaine 4% T opical Solution. No specimens were taken. A time out was conducted at 15:15, prior to the start of the procedure. There was no bleeding. The procedure was tolerated well with a pain level of 0 throughout and a pain level of 0 following the procedure. Post Debridement Measurements: 0.1cm length x 0.1cm width x 0.1cm depth; 0.001cm^3 volume. Character of Wound/Ulcer Post Debridement is improved. Post procedure Diagnosis Wound #1: Same as Pre-Procedure General Notes: scribed by Baruch Gouty, RN for Dr. Celine Ahr. Plan Follow-up Appointments: Return Appointment in 2 weeks. - Dr. Celine Ahr - room 1 Anesthetic: Wound #1 Left Calcaneus: (In clinic) Topical Lidocaine 4% applied to wound bed Bathing/ Shower/ Hygiene: Other Bathing/Shower/Hygiene Orders/Instructions: - Change wound dressing after bathing Edema Control - Lymphedema / SCD / Other: Avoid standing for long periods of time. Moisturize legs daily. Off-Loading: Other: - Please wear off-loading shoe on left  foot to keep pressure off the heel WOUND #1: - Calcaneus Wound Laterality: Left Cleanser: Soap and Water 1 x Per Day/30 Days Discharge Instructions: May shower and wash wound with dial antibacterial soap and water prior to dressing change. Cleanser: Wound Cleanser 1 x Per Day/30 Days Discharge Instructions: Cleanse the wound with wound cleanser prior to applying a clean dressing using gauze sponges, not tissue or cotton balls. Prim Dressing: Hydrofera Blue Ready Foam, 2.5 x2.5 in (Generic) 1 x Per Day/30 Days ary Discharge Instructions: Apply to wound bed as instructed Secondary Dressing: Woven Gauze Sponge, Non-Sterile 4x4 in 1 x Per Day/30 Days Discharge Instructions: Apply over primary dressing as directed. Secured With: Elastic Bandage 4 inch (ACE bandage) 1 x Per Day/30 Days Discharge Instructions: Secure with ACE bandage as directed. Secured With: [employer], 4.5x3.1  (in/yd) 1 x Per Day/30 Days Discharge Instructions: Secure with Kerlix as directed. Secured With: 3M Medipore Public affairs consultant Surgical T 2x10 (in/yd) (Generic) 1 x Per Day/30 Days ape Discharge Instructions: Secure with tape as directed. 07/08/2022: The wound is down to just a pinhole. There is a little bit of eschar on the surface. No concern for infection. I used a curette to debride the eschar from the wound. We will continue to use Hydrofera Blue. She will follow-up in 2 weeks, at which time I anticipate the wound will be completely closed. Electronic Signature(s) Signed: 07/08/2022 5:30:40 PM By: Baruch Gouty RN, BSN Signed: 07/09/2022 12:22:25 PM By: Fredirick Maudlin MD FACS Previous Signature: 07/08/2022 3:34:23 PM Version By: Fredirick Maudlin MD FACS Entered By: Baruch Gouty on 07/08/2022 16:31:37 -------------------------------------------------------------------------------- HxROS Details Patient Name: Date of Service: Destiny [name] 07/08/2022 2:45 PM Medical Record Number: [id number] Patient Account Number: [id number] Date of Birth/Sex: Treating RN: [birth year] (76 y.o. Elam Dutch Primary Care Provider: Deland Pretty Other Clinician: Referring Provider: Treating Provider/Extender: Raylene Everts in Treatment: 7 Information Obtained From Patient Eyes Medical History: Past Medical History Notes: Macular Degeneration Ear/Nose/Mouth/Throat Medical History: Past Medical History Notes: Tinnitus Endocrine Medical History: Positive for: Type II Diabetes - Metformin pillsand Ozempic Time with diabetes: 25 years Treated with: Oral agents Blood sugar tested every day: Yes Tested : every day Musculoskeletal Medical History: Positive for: Osteoarthritis Oncologic Medical History: Past Medical History Notes: 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 has had Chemotherapy (stopped) and Radiation -8 treatments left as of 05/19/22 Rectal cancer Psychiatric Medical  History: Past Medical History Notes: Hx Depression Immunizations Pneumococcal Vaccine: Received Pneumococcal Vaccination: Yes Received Pneumococcal Vaccination On or After 60th Birthday: Yes Implantable Devices None Hospitalization / Surgery History Type of Hospitalization/Surgery Melanoma excision to left heel;ORIF Left ankle fracture; Family and Social History Former smoker - 1991; Marital Status - Divorced; Alcohol Use: Never; Drug Use: No History; Caffeine Use: Moderate - Diet coke,coffee; Financial Concerns: No; Food, Clothing or Shelter Needs: No; Support System Lacking: No; Transportation Concerns: No Electronic Signature(s) Signed: 07/08/2022 3:40:52 PM By: Fredirick Maudlin MD FACS Signed: 07/08/2022 5:30:40 PM By: Baruch Gouty RN, BSN Entered By: Fredirick Maudlin on 07/08/2022 15:33:22 -------------------------------------------------------------------------------- SuperBill Details Patient Name: Date of Service: Destiny [name] 07/08/2022 Medical Record Number: [id number] Patient Account Number: [id number] Date of Birth/Sex: Treating RN: 12-26-1945 (76 y.o. Elam Dutch Primary Care Provider: Deland Pretty Other Clinician: Referring Provider: Treating Provider/Extender: Raylene Everts in Treatment: 7 Diagnosis Coding ICD-10 Codes Code Description [phone] Non-pressure chronic ulcer of left heel and midfoot with fat layer exposed T81.31XS Disruption of external operation (surgical)  wound, not elsewhere classified, sequela C43.9 Malignant melanoma of skin, unspecified C20 Malignant neoplasm of rectum E11.9 Type 2 diabetes mellitus without complications K24.46 Personal history of antineoplastic chemotherapy Z92.3 Personal history of irradiation Facility Procedures CPT4 Code: [id number] Description: [phone] - DEBRIDE WOUND 1ST 20 SQ CM OR < ICD-10 Diagnosis Description L97.422 Non-pressure chronic ulcer of left heel and midfoot with fat  layer exposed Modifier: Quantity: 1 Physician Procedures : CPT4 Code Description Modifier [id number] - WC PHYS LEVEL 3 - EST PT 25 ICD-10 Diagnosis Description L97.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 T81.31XS Disruption of external operation (surgical) wound, not  elsewhere classified, sequela C43.9 Malignant melanoma of skin, unspecified Z92.21 Personal history of antineoplastic chemotherapy Quantity: 1 : [id number] [id number] - WC PHYS DEBR WO ANESTH 20 SQ CM ICD-10 Diagnosis Description L97.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 Quantity: 1 Electronic Signature(s) Signed: 07/08/2022 3:35:19 PM By: Fredirick Maudlin MD FACS Entered By: Fredirick Maudlin on 07/08/2022 15:35:19

## 2022-07-18 DIAGNOSIS — Z23 Encounter for immunization: Secondary | ICD-10-CM | POA: Diagnosis not present

## 2022-07-22 ENCOUNTER — Encounter (HOSPITAL_BASED_OUTPATIENT_CLINIC_OR_DEPARTMENT_OTHER): Payer: PPO | Attending: General Surgery | Admitting: General Surgery

## 2022-07-22 DIAGNOSIS — T8131XA Disruption of external operation (surgical) wound, not elsewhere classified, initial encounter: Secondary | ICD-10-CM | POA: Diagnosis not present

## 2022-07-22 DIAGNOSIS — Y838 Other surgical procedures as the cause of abnormal reaction of the patient, or of later complication, without mention of misadventure at the time of the procedure: Secondary | ICD-10-CM | POA: Insufficient documentation

## 2022-07-22 DIAGNOSIS — C20 Malignant neoplasm of rectum: Secondary | ICD-10-CM | POA: Insufficient documentation

## 2022-07-22 DIAGNOSIS — Z9221 Personal history of antineoplastic chemotherapy: Secondary | ICD-10-CM | POA: Insufficient documentation

## 2022-07-22 DIAGNOSIS — E11621 Type 2 diabetes mellitus with foot ulcer: Secondary | ICD-10-CM | POA: Diagnosis not present

## 2022-07-22 DIAGNOSIS — Z85820 Personal history of malignant melanoma of skin: Secondary | ICD-10-CM | POA: Diagnosis not present

## 2022-07-22 DIAGNOSIS — L97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: Secondary | ICD-10-CM | POA: Diagnosis not present

## 2022-07-22 DIAGNOSIS — Z923 Personal history of irradiation: Secondary | ICD-10-CM | POA: Diagnosis not present

## 2022-07-25 NOTE — Progress Notes (Signed)
Destiny [name], Destiny [name] ([id number]) [id number] Page 1 of 7 Visit Report for 07/22/2022 Chief Complaint Document Details Patient Name: Date of Service: Destiny [name], Destiny [name] 07/22/2022 2:45 PM Medical Record Number: [id number] Patient Account Number: [id number] Date of Birth/Sex: Treating RN: Feb 14, 1946 (76 y.o. F) Primary Care Provider: Deland Pretty Other Clinician: Referring Provider: Treating Provider/Extender: Raylene Everts in Treatment: 9 Information Obtained from: Patient Chief Complaint Patient presents to the wound care center with open non-healing surgical wound(s) Electronic Signature(s) Signed: 07/22/2022 3:05:56 PM By: Fredirick Maudlin MD FACS Entered By: Fredirick Maudlin on 07/22/2022 15:05:55 -------------------------------------------------------------------------------- HPI Details Patient Name: Date of Service: Destiny [name]. 07/22/2022 2:45 PM Medical Record Number: [id number] Patient Account Number: [id number] Date of Birth/Sex: Treating RN: 1945-12-17 (76 y.o. 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 After the final reexcision, the wound had an ACell graft applied. She reports that she had good closure of skin over the site. Earlier this year, at the end of May, however, she was diagnosed with rectal cancer and is currently undergoing neoadjuvant therapy (chemotherapy and radiation) in anticipation of future surgery. She reports that about 2 weeks ago, she noted that her skin coverage over the heel was beginning to break down. Her oncologist felt that perhaps the Xeloda was contributing to wound failure and stopped this  medication. She continues to receive neoadjuvant radiation therapy, however. She saw plastic surgery last week, and they referred her to the wound care center for further evaluation and management. She is a type II diabetic, well controlled, with the most recent A1c available to me being 5.9%. ABIs done 1 year ago were normal. On the patient's left heel, there is an irregular wound with some skin maceration. The fat layer is exposed and there is slough over the entire surface. Outside of the area of maceration, the skin is in good condition. No erythema, induration, or odor. No concern for infection. 05/26/2022: The wound is smaller today and is extremely clean, without any slough buildup. The periwound skin is in much better condition and is no longer macerated. She is scheduled to complete her radiation treatment on Wednesday. 06/03/2022: The wound continues to contract. There is just a little bit of eschar and callus accumulation. The surface is clean. She has completed all of her neoadjuvant therapy and is awaiting surgery in November. 06/10/2022: The wound is smaller by about a centimeter in all dimensions. She does have a little bit of eschar near the Achilles portion of the wound. The wound surface itself is robust and healthy-appearing. 06/24/2022: The wound continues to contract. There is just a small portion open in the center. She does have some dry skin and periwound callus circumferentially. No concern for infection. 07/08/2022: The wound is down to just a pinhole. There is a little bit of eschar on the surface. No concern for infection. 07/22/2022: Her wound is healed. Destiny [name], Destiny [name] ([id number]) [id number] Page 2 of 7 Electronic Signature(s) Signed: 07/22/2022 3:09:48 PM By: Fredirick Maudlin MD FACS Entered By: Fredirick Maudlin on 07/22/2022 15:09:48 -------------------------------------------------------------------------------- Physical Exam  Details Patient Name: Date of Service: Destiny [name] 07/22/2022 2:45 PM Medical Record Number: [id number] Patient Account Number: [id number] Date of Birth/Sex: Treating RN: 31-May-1946 (76 y.o. F) Primary Care Provider: Deland Pretty  Other Clinician: Referring Provider: Treating Provider/Extender: Raylene Everts in Treatment: 9 Constitutional . . . . No acute distress.Marland Kitchen Respiratory Normal work of breathing on room air.. Notes 07/22/2022: Her wound is healed. Electronic Signature(s) Signed: 07/22/2022 3:10:21 PM By: Fredirick Maudlin MD FACS Entered By: Fredirick Maudlin on 07/22/2022 15:10:20 -------------------------------------------------------------------------------- Physician Orders Details Patient Name: Date of Service: Destiny [name] 07/22/2022 2:45 PM Medical Record Number: [id number] Patient Account Number: [id number] Date of Birth/Sex: Treating RN: [birth year] (76 y.o. Iver Nestle, Jamie Primary Care Provider: Deland Pretty Other Clinician: Referring Provider: Treating Provider/Extender: Raylene Everts in Treatment: 9 Verbal / Phone Orders: No Diagnosis Coding ICD-10 Coding Code Description [phone] Non-pressure chronic ulcer of left heel and midfoot with fat layer exposed T81.31XS Disruption of external operation (surgical) wound, not elsewhere classified, sequela C43.9 Malignant melanoma of skin, unspecified C20 Malignant neoplasm of rectum E11.9 Type 2 diabetes mellitus without complications L79.89 Personal history of antineoplastic chemotherapy Z92.3 Personal history of irradiation Discharge From [facility] Services Discharge from [place]!! Bathing/ Shower/ Hygiene May shower and wash wound with soap and water. Edema Control - Lymphedema / SCD / Other Avoid standing for long periods of time. Destiny [name], Destiny [name] ([id number]) [id number] Page 3 of 7 Moisturize legs  daily. Off-Loading Other: - May wear regular shoes Electronic Signature(s) Signed: 07/22/2022 4:04:22 PM By: Fredirick Maudlin MD FACS Signed: 07/25/2022 5:15:19 PM By: Blanche East RN Entered By: Blanche East on 07/22/2022 15:11:05 -------------------------------------------------------------------------------- Problem List Details Patient Name: Date of Service: Destiny [name] 07/22/2022 2:45 PM Medical Record Number: [id number] Patient Account Number: [id number] Date of Birth/Sex: Treating RN: [birth year] (76 y.o. F) Primary Care Provider: Deland Pretty Other Clinician: Referring Provider: Treating Provider/Extender: Raylene Everts in Treatment: 9 Active Problems ICD-10 Encounter Code Description Active Date MDM Diagnosis [phone] Non-pressure chronic ulcer of left heel and midfoot with fat layer exposed 05/19/2022 No Yes T81.31XS Disruption of external operation (surgical) wound, not elsewhere classified, 05/19/2022 No Yes sequela C43.9 Malignant melanoma of skin, unspecified 05/19/2022 No Yes C20 Malignant neoplasm of rectum 05/19/2022 No Yes E11.9 Type 2 diabetes mellitus without complications 11/19/3783 No Yes Z92.21 Personal history of antineoplastic chemotherapy 05/19/2022 No Yes Z92.3 Personal history of irradiation 05/19/2022 No Yes Inactive Problems Resolved Problems Electronic Signature(s) Signed: 07/22/2022 3:05:35 PM By: Fredirick Maudlin MD FACS Entered By: Fredirick Maudlin on 07/22/2022 15:05:34 Destiny [name], Destiny [name] ([id number]) [id number] Page 4 of 7 -------------------------------------------------------------------------------- Progress Note Details Patient Name: Date of Service: Destiny [name], Destiny [name] 07/22/2022 2:45 PM Medical Record Number: [id number] Patient Account Number: [id number] Date of Birth/Sex: Treating RN: Jul 13, 1946 (76 y.o. 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 After the final reexcision, the wound had an ACell graft applied. She reports that she had good closure of skin over the site. Earlier this year, at the end of May, however, she was diagnosed with rectal cancer and is currently undergoing neoadjuvant therapy (chemotherapy and radiation) in anticipation of future surgery. She reports that about 2 weeks ago, she noted that her skin coverage over the heel was beginning to break down. Her oncologist felt that perhaps the Xeloda  was contributing to wound failure and stopped this medication. She continues to receive neoadjuvant radiation therapy, however. She saw plastic surgery last week, and they referred her to the wound care center for further evaluation and management. She is a type II diabetic, well controlled, with the most recent A1c available to me being 5.9%. ABIs done 1 year ago were normal. On the patient's left heel, there is an irregular wound with some skin maceration. The fat layer is exposed and there is slough over the entire surface. Outside of the area of maceration, the skin is in good condition. No erythema, induration, or odor. No concern for infection. 05/26/2022: The wound is smaller today and is extremely clean, without any slough buildup. The periwound skin is in much better condition and is no longer macerated. She is scheduled to complete her radiation treatment on Wednesday. 06/03/2022: The wound continues to contract. There is just a little bit of eschar and callus accumulation. The surface is clean. She has completed all of  her neoadjuvant therapy and is awaiting surgery in November. 06/10/2022: The wound is smaller by about a centimeter in all dimensions. She does have a little bit of eschar near the Achilles portion of the wound. The wound surface itself is robust and healthy-appearing. 06/24/2022: The wound continues to contract. There is just a small portion open in the center. She does have some dry skin and periwound callus circumferentially. No concern for infection. 07/08/2022: The wound is down to just a pinhole. There is a little bit of eschar on the surface. No concern for infection. 07/22/2022: Her wound is healed. Patient History Information obtained from Patient. Social History Former smoker - 1991, Marital Status - Divorced, Alcohol Use - Never, Drug Use - No History, Caffeine Use - Moderate - Diet coke,coffee. Medical History Endocrine Patient has history of Type II Diabetes - Metformin pillsand Ozempic Musculoskeletal Patient has history of Osteoarthritis Hospitalization/Surgery History - Melanoma excision to left heel;ORIF Left ankle fracture;Marland Kitchen Medical A Surgical History Notes nd Eyes Macular Degeneration Ear/Nose/Mouth/Throat Tinnitus Oncologic 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 has had Chemotherapy (stopped) and Radiation -8 treatments left as of 05/19/22 Rectal cancer Psychiatric Hx Depression Objective Destiny [name], Destiny [name] ([id number]) [id number] Page 5 of 7 Constitutional No acute distress.. Vitals Time Taken: 2:52 PM, Height: 63 in, Temperature: 98.3 F, Pulse: 81 bpm, Respiratory Rate: 18 breaths/min, Blood Pressure: 137/82 mmHg. Respiratory Normal work of breathing on room air.. General Notes: 07/22/2022: Her wound is healed. Integumentary (Hair, Skin) Wound #1 status is Healed - Epithelialized. Original cause of wound was Hematoma. The date acquired was: 05/05/2022. The wound has been in treatment 9 weeks. The wound is located on the Left Calcaneus. The wound  measures 0cm length x 0cm width x 0cm depth; 0cm^2 area and 0cm^3 volume. There is Fat Layer (Subcutaneous Tissue) exposed. There is no tunneling or undermining noted. There is a medium amount of serosanguineous drainage noted. The wound margin is flat and intact. There is large (67-100%) red granulation within the wound bed. There is no necrotic tissue within the wound bed. The periwound skin appearance had no abnormalities noted for color. The periwound skin appearance exhibited: Callus, Dry/Scaly. The periwound skin appearance did not exhibit: Maceration. Assessment Active Problems ICD-10 Non-pressure chronic ulcer of left heel and midfoot with fat layer exposed Disruption of external operation (surgical) wound, not elsewhere classified, sequela Malignant melanoma of skin, unspecified Malignant neoplasm of rectum Type 2 diabetes mellitus without complications Personal history of antineoplastic chemotherapy Personal history of  irradiation Plan Discharge From [facility] Services: Discharge from [place]!! Bathing/ Shower/ Hygiene: May shower and wash wound with soap and water. Edema Control - Lymphedema / SCD / Other: Avoid standing for long periods of time. Moisturize legs daily. Off-Loading: Other: - May wear regular shoes 07/22/2022: Her wound is healed. I recommended that she continue to protect the area for the next couple of weeks to avoid reopening the freshly healed site. She should use a rich moisturizer and heel pad during this time. She can discontinue the heel offloading shoe. We will discharge her from the wound care center. She may follow-up as needed. Electronic Signature(s) Signed: 07/22/2022 3:11:17 PM By: Fredirick Maudlin MD FACS Entered By: Fredirick Maudlin on 07/22/2022 15:11:17 -------------------------------------------------------------------------------- HxROS Details Patient Name: Date of Service: Destiny [name]. 07/22/2022 2:45  PM Medical Record Number: [id number] Patient Account Number: [id number] Date of Birth/Sex: Treating RN: Jun 11, 1946 (76 y.o. F) Primary Care Provider: Deland Pretty Other Clinician: Referring Provider: Treating Provider/Extender: Raylene Everts in Treatment: [address] [place] F ([id number]) [id number] Page 6 of 7 Information Obtained From Patient Eyes Medical History: Past Medical History Notes: Macular Degeneration Ear/Nose/Mouth/Throat Medical History: Past Medical History Notes: Tinnitus Endocrine Medical History: Positive for: Type II Diabetes - Metformin pillsand Ozempic Time with diabetes: 25 years Treated with: Oral agents Blood sugar tested every day: Yes Tested : every day Musculoskeletal Medical History: Positive for: Osteoarthritis Oncologic Medical History: Past Medical History Notes: 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 has had Chemotherapy (stopped) and Radiation -8 treatments left as of 05/19/22 Rectal cancer Psychiatric Medical History: Past Medical History Notes: Hx Depression Immunizations Pneumococcal Vaccine: Received Pneumococcal Vaccination: Yes Received Pneumococcal Vaccination On or After 60th Birthday: Yes Implantable Devices None Hospitalization / Surgery History Type of Hospitalization/Surgery Melanoma excision to left heel;ORIF Left ankle fracture; Family and Social History Former smoker - 1991; Marital Status - Divorced; Alcohol Use: Never; Drug Use: No History; Caffeine Use: Moderate - Diet coke,coffee; Financial Concerns: No; Food, Clothing or Shelter Needs: No; Support System Lacking: No; Transportation Concerns: No Electronic Signature(s) Signed: 07/22/2022 4:04:22 PM By: Fredirick Maudlin MD FACS Entered By: Fredirick Maudlin on 07/22/2022 15:09:54 -------------------------------------------------------------------------------- SuperBill Details Patient Name: Date of Service: Destiny [name]  07/22/2022 Medical Record Number: [id number] Patient Account Number: [id number] Date of Birth/Sex: Treating RN: 12/07/1945 (76 y.o. F) Primary Care Provider: Deland Pretty Other Clinician: Eula [name] ([id number]) [id number] Page 7 of 7 Referring Provider: Treating Provider/Extender: Raylene Everts in Treatment: 9 Diagnosis Coding ICD-10 Codes Code Description [phone] Non-pressure chronic ulcer of left heel and midfoot with fat layer exposed T81.31XS Disruption of external operation (surgical) wound, not elsewhere classified, sequela C43.9 Malignant melanoma of skin, unspecified C20 Malignant neoplasm of rectum E11.9 Type 2 diabetes mellitus without complications W54.62 Personal history of antineoplastic chemotherapy Z92.3 Personal history of irradiation Facility Procedures : CPT4 Code: [id number] Description: 99213 - WOUND CARE VISIT-LEV 3 EST PT Modifier: 25 Quantity: 1 Physician Procedures : CPT4 Code Description Modifier [id number] - WC PHYS LEVEL 3 - EST PT ICD-10 Diagnosis Description L97.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 T81.31XS Disruption of external operation (surgical) wound, not  elsewhere classified, sequela C43.9 Malignant melanoma of skin, unspecified Z92.21 Personal history of antineoplastic chemotherapy Quantity: 1 Electronic Signature(s) Signed: 07/22/2022 4:04:22 PM By: Fredirick Maudlin MD FACS Signed: 07/25/2022 5:15:19 PM By: Blanche East RN Previous Signature: 07/22/2022 3:11:35 PM Version By: Fredirick Maudlin MD FACS  Entered By: Blanche East on 07/22/2022 15:35:37

## 2022-07-25 NOTE — Progress Notes (Signed)
Destiny, [name] ([id number]) [id number] Page 1 of 7 Visit Report for 07/22/2022 Arrival Information Details Patient Name: Date of Service: Destiny [name], Destiny [name] 07/22/2022 2:45 PM Medical Record Number: [id number] Patient Account Number: [id number] Date of Birth/Sex: Treating RN: 10-08-1946 (76 y.o. Iver Nestle, [place] Primary Care Kalvyn Desa: Deland Pretty Other Clinician: Referring Beyla Loney: Treating Abegail Kloeppel/Extender: Raylene Everts in Treatment: 9 Visit Information History Since Last Visit All ordered tests and consults were completed: Yes Patient Arrived: Ambulatory Added or deleted any medications: No Arrival Time: 14:51 Any new allergies or adverse reactions: No Accompanied By: self Had a fall or experienced change in No Transfer Assistance: None activities of daily living that may affect Patient Identification Verified: Yes risk of falls: Secondary Verification Process Completed: Yes Signs or symptoms of abuse/neglect since last visito No Patient Requires Transmission-Based Precautions: No Hospitalized since last visit: No Patient Has Alerts: No Implantable device outside of the clinic excluding No cellular tissue based products placed in the center since last visit: Has Dressing in Place as Prescribed: Yes Has Footwear/Offloading in Place as Prescribed: Yes Left: Wedge Shoe Pain Present Now: No Electronic Signature(s) Signed: 07/25/2022 5:15:19 PM By: Blanche East RN Entered By: Blanche East on 07/22/2022 14:52:37 -------------------------------------------------------------------------------- Clinic Level of Care Assessment Details Patient Name: Date of Service: Destiny, [name] 07/22/2022 2:45 PM Medical Record Number: [id number] Patient Account Number: [id number] Date of Birth/Sex: Treating RN: [birth year] (76 y.o. [name] Primary Care Calene Paradiso: Deland Pretty Other Clinician: Referring Keimora Swartout: Treating  Aquarius Latouche/Extender: Raylene Everts in Treatment: 9 Clinic Level of Care Assessment Items TOOL 4 Quantity Score X- 1 0 Use when only an EandM is performed on FOLLOW-UP visit ASSESSMENTS - Nursing Assessment / Reassessment X- 1 10 Reassessment of Co-morbidities (includes updates in patient status) X- 1 5 Reassessment of Adherence to Treatment Plan ASSESSMENTS - Wound and Skin A ssessment / Reassessment X - Simple Wound Assessment / Reassessment - one wound 1 5 '[]'$  - 0 Complex Wound Assessment / Reassessment - multiple wounds '[]'$  - 0 Dermatologic / Skin Assessment (not related to wound area) ASSESSMENTS - Focused Assessment X- 1 5 Circumferential Edema Measurements - multi extremities GENEAN, ADAMSKI ([id number]) [id number] Page 2 of 7 '[]'$  - 0 Nutritional Assessment / Counseling / Intervention '[]'$  - 0 Lower Extremity Assessment (monofilament, tuning fork, pulses) '[]'$  - 0 Peripheral Arterial Disease Assessment (using hand held doppler) ASSESSMENTS - Ostomy and/or Continence Assessment and Care '[]'$  - 0 Incontinence Assessment and Management '[]'$  - 0 Ostomy Care Assessment and Management (repouching, etc.) PROCESS - Coordination of Care X - Simple Patient / Family Education for ongoing care 1 15 '[]'$  - 0 Complex (extensive) Patient / Family Education for ongoing care '[]'$  - 0 Staff obtains Programmer, systems, Records, T Results / Process Orders est '[]'$  - 0 Staff telephones HHA, Nursing Homes / Clarify orders / etc '[]'$  - 0 Routine Transfer to another Facility (non-emergent condition) '[]'$  - 0 Routine Hospital Admission (non-emergent condition) '[]'$  - 0 New Admissions / Biomedical engineer / Ordering NPWT Apligraf, etc. , '[]'$  - 0 Emergency Hospital Admission (emergent condition) X- 1 10 Simple Discharge Coordination '[]'$  - 0 Complex (extensive) Discharge Coordination PROCESS - Special Needs '[]'$  - 0 Pediatric / Minor Patient Management '[]'$  -  0 Isolation Patient Management '[]'$  - 0 Hearing / Language / Visual special needs '[]'$  - 0 Assessment of Community assistance (transportation, D/C planning, etc.) '[]'$  - 0 Additional assistance / Altered mentation '[]'$  - 0 Support  Surface(s) Assessment (bed, cushion, seat, etc.) INTERVENTIONS - Wound Cleansing / Measurement X - Simple Wound Cleansing - one wound 1 5 '[]'$  - 0 Complex Wound Cleansing - multiple wounds X- 1 5 Wound Imaging (photographs - any number of wounds) '[]'$  - 0 Wound Tracing (instead of photographs) X- 1 5 Simple Wound Measurement - one wound '[]'$  - 0 Complex Wound Measurement - multiple wounds INTERVENTIONS - Wound Dressings X - Small Wound Dressing one or multiple wounds 1 10 '[]'$  - 0 Medium Wound Dressing one or multiple wounds '[]'$  - 0 Large Wound Dressing one or multiple wounds '[]'$  - 0 Application of Medications - topical '[]'$  - 0 Application of Medications - injection INTERVENTIONS - Miscellaneous '[]'$  - 0 External ear exam '[]'$  - 0 Specimen Collection (cultures, biopsies, blood, body fluids, etc.) '[]'$  - 0 Specimen(s) / Culture(s) sent or taken to Lab for analysis '[]'$  - 0 Patient Transfer (multiple staff / Harrel Lemon Lift / Similar devices) '[]'$  - 0 Simple Staple / Suture removal (25 or less) '[]'$  - 0 Complex Staple / Suture removal (26 or more) Lecuyer, Eleanore F ([id number]) [id number] Page 3 of 7 '[]'$  - 0 Hypo / Hyperglycemic Management (close monitor of Blood Glucose) '[]'$  - 0 Ankle / Brachial Index (ABI) - do not check if billed separately X- 1 5 Vital Signs Has the patient been seen at the hospital within the last three years: Yes Total Score: 80 Level Of Care: New/Established - Level 3 Electronic Signature(s) Signed: 07/25/2022 5:15:19 PM By: Blanche East RN Entered By: Blanche East on 07/22/2022 15:35:11 -------------------------------------------------------------------------------- Encounter Discharge Information Details Patient  Name: Date of Service: Destiny [name] 07/22/2022 2:45 PM Medical Record Number: [id number] Patient Account Number: [id number] Date of Birth/Sex: Treating RN: 1946-07-24 (76 y.o. [name] Primary Care Kaitland Lewellyn: Deland Pretty Other Clinician: Referring Sahvanna Mcmanigal: Treating Farrah Skoda/Extender: Raylene Everts in Treatment: 9 Encounter Discharge Information Items Discharge Condition: Stable Ambulatory Status: Ambulatory Discharge Destination: Home Transportation: Private Auto Accompanied By: self Schedule Follow-up Appointment: Yes Clinical Summary of Care: Electronic Signature(s) Signed: 07/25/2022 5:15:19 PM By: Blanche East RN Entered By: Blanche East on 07/22/2022 15:36:06 -------------------------------------------------------------------------------- Lower Extremity Assessment Details Patient Name: Date of Service: DARYN, PISANI 07/22/2022 2:45 PM Medical Record Number: [id number] Patient Account Number: [id number] Date of Birth/Sex: Treating RN: 1946-08-17 (76 y.o. [name] Primary Care Lysa Livengood: Deland Pretty Other Clinician: Referring Carter Kassel: Treating Hodari Chuba/Extender: Raylene Everts in Treatment: 9 Edema Assessment Assessed: [name]: No] [Right: No] Edema: [Left: N] [Right: o] Calf Left: Right: Point of Measurement: 29 cm From Medial Instep 36.1 cm Ankle Left: Right: Point of Measurement: 10 cm From Medial Instep 21.4 cm Horsfall, Yun F ([id number]) [id number] Page 4 of 7 Electronic Signature(s) Signed: 07/25/2022 5:15:19 PM By: Blanche East RN Entered By: Blanche East on 07/22/2022 14:53:42 -------------------------------------------------------------------------------- Multi Wound Chart Details Patient Name: Date of Service: Destiny [name] 07/22/2022 2:45 PM Medical Record Number: [id number] Patient Account Number: [id number] Date of Birth/Sex: Treating  RN: [birth year] (76 y.o. F) Primary Care Vashti Bolanos: Deland Pretty Other Clinician: Referring Traci Plemons: Treating Yilia Sacca/Extender: Raylene Everts in Treatment: 9 Vital Signs Height(in): 63 Pulse(bpm): 65 Weight(lbs): Blood Pressure(mmHg): 137/82 Body Mass Index(BMI): Temperature(F): 98.3 Respiratory Rate(breaths/min): 18 [1:Photos:] [N/A:N/A] Left Calcaneus N/A N/A Wound Location: Hematoma N/A N/A Wounding Event: Trauma, Other N/A N/A Primary Etiology: Type II Diabetes, Osteoarthritis N/A N/A Comorbid History: 05/05/2022 N/A N/A Date Acquired: 9 N/A N/A Weeks of Treatment: Open N/A  N/A Wound Status: No N/A N/A Wound Recurrence: 0.1x0.1x0.1 N/A N/A Measurements L x W x D (cm) 0.008 N/A N/A A (cm) : rea 0.001 N/A N/A Volume (cm) : 99.90% N/A N/A % Reduction in A rea: 99.90% N/A N/A % Reduction in Volume: Full Thickness Without Exposed N/A N/A Classification: Support Structures Medium N/A N/A Exudate Amount: Serosanguineous N/A N/A Exudate Type: red, brown N/A N/A Exudate Color: Flat and Intact N/A N/A Wound Margin: Large (67-100%) N/A N/A Granulation Amount: Red N/A N/A Granulation Quality: None Present (0%) N/A N/A Necrotic Amount: Fat Layer (Subcutaneous Tissue): Yes N/A N/A Exposed Structures: Fascia: No Tendon: No Muscle: No Joint: No Bone: No Medium (34-66%) N/A N/A Epithelialization: Callus: Yes N/A N/A Periwound Skin Texture: Dry/Scaly: Yes N/A N/A Periwound Skin Moisture: Maceration: No No Abnormalities Noted N/A N/A Periwound Skin Color: Treatment Notes Electronic Signature(s) GESSICA, JAWAD ([id number]) [id number] Page 5 of 7 Signed: 07/22/2022 3:05:49 PM By: Fredirick Maudlin MD FACS Entered By: Fredirick Maudlin on 07/22/2022 15:05:49 -------------------------------------------------------------------------------- Multi-Disciplinary Care Plan Details Patient Name: Date of  Service: BAILY, HOVANEC 07/22/2022 2:45 PM Medical Record Number: [id number] Patient Account Number: [id number] Date of Birth/Sex: Treating RN: April 03, 1946 (76 y.o. [name] Primary Care Destine Zirkle: Deland Pretty Other Clinician: Referring Nicoletta Hush: Treating Nithin Demeo/Extender: Raylene Everts in Treatment: [place] reviewed with physician Active Inactive Electronic Signature(s) Signed: 07/25/2022 5:15:19 PM By: Blanche East RN Entered By: Blanche East on 07/22/2022 15:50:35 -------------------------------------------------------------------------------- Pain Assessment Details Patient Name: Date of Service: PAVIELLE, BIGGAR 07/22/2022 2:45 PM Medical Record Number: [id number] Patient Account Number: [id number] Date of Birth/Sex: Treating RN: [birth year] (76 y.o. [name] Primary Care Viviene Thurston: Deland Pretty Other Clinician: Referring Caelen Reierson: Treating Megen Madewell/Extender: Raylene Everts in Treatment: 9 Active Problems Location of Pain Severity and Description of Pain Patient Has Paino No Site Locations Pain Management and Medication Current Pain Management: CALEA, HRIBAR ([id number]) [phone].pdf Page 6 of 7 Electronic Signature(s) Signed: 07/25/2022 5:15:19 PM By: Blanche East RN Entered By: Blanche East on 07/22/2022 14:53:35 -------------------------------------------------------------------------------- Patient/Caregiver Education Details Patient Name: Date of Service: Destiny [name] 10/10/2023andnbsp2:45 PM Medical Record Number: [id number] Patient Account Number: [id number] Date of Birth/Gender: Treating RN: [birth year] (76 y.o. [name] Primary Care Physician: Deland Pretty Other Clinician: Referring Physician: Treating Physician/Extender: Raylene Everts in Treatment: 9 Education Assessment Education Provided  To: Patient Education Topics Provided Electronic Signature(s) Signed: 07/25/2022 5:15:19 PM By: Blanche East RN Entered By: Blanche East on 07/22/2022 15:04:45 -------------------------------------------------------------------------------- Wound Assessment Details Patient Name: Date of Service: LIEN, LYMAN 07/22/2022 2:45 PM Medical Record Number: [id number] Patient Account Number: [id number] Date of Birth/Sex: Treating RN: 06-May-1946 (76 y.o. [name] Primary Care Verl Kitson: Deland Pretty Other Clinician: Referring Vanity Larsson: Treating Chemika Nightengale/Extender: Raylene Everts in Treatment: 9 Wound Status Wound Number: 1 Primary Etiology: Trauma, Other Wound Location: Left Calcaneus Wound Status: Healed - Epithelialized Wounding Event: Hematoma Comorbid History: Type II Diabetes, Osteoarthritis Date Acquired: 05/05/2022 Weeks Of Treatment: 9 Clustered Wound: No Photos Wound Measurements ASHLYNNE, SHETTERLY ([id number]) Length: (cm) Width: (cm) Depth: (cm) Area: (cm) Volume: (cm) [phone] Page 7 of 7 0 % Reduction in Area: 100% 0 % Reduction in Volume: 100% 0 Epithelialization: Medium (34-66%) 0 Tunneling: No 0 Undermining: No Wound Description Classification: Full Thickness Without Exposed Suppor Wound Margin: Flat and Intact Exudate Amount: Medium Exudate Type: Serosanguineous Exudate Color: red, brown t Structures Foul Odor After  Cleansing: No Slough/Fibrino No Wound Bed Granulation Amount: Large (67-100%) Exposed Structure Granulation Quality: Red Fascia Exposed: No Necrotic Amount: None Present (0%) Fat Layer (Subcutaneous Tissue) Exposed: Yes Tendon Exposed: No Muscle Exposed: No Joint Exposed: No Bone Exposed: No Periwound Skin Texture Texture Color No Abnormalities Noted: No No Abnormalities Noted: Yes Callus: Yes Moisture No Abnormalities Noted: No Dry / Scaly: Yes Maceration:  No Electronic Signature(s) Signed: 07/25/2022 5:15:19 PM By: Blanche East RN Entered By: Blanche East on 07/22/2022 15:08:14 -------------------------------------------------------------------------------- Vitals Details Patient Name: Date of Service: Destiny [name]. 07/22/2022 2:45 PM Medical Record Number: [id number] Patient Account Number: [id number] Date of Birth/Sex: Treating RN: Jan 07, 1946 (76 y.o. Iver Nestle, Jamie Primary Care Donnisha Besecker: Deland Pretty Other Clinician: Referring Donielle Kaigler: Treating Ryo Klang/Extender: Raylene Everts in Treatment: 9 Vital Signs Time Taken: 14:52 Temperature (F): 98.3 Height (in): 63 Pulse (bpm): 81 Respiratory Rate (breaths/min): 18 Blood Pressure (mmHg): 137/82 Reference Range: 80 - 120 mg / dl Electronic Signature(s) Signed: 07/25/2022 5:15:19 PM By: Blanche East RN Entered By: Blanche East on 07/22/2022 14:53:31

## 2022-07-31 DIAGNOSIS — E039 Hypothyroidism, unspecified: Secondary | ICD-10-CM | POA: Diagnosis not present

## 2022-07-31 DIAGNOSIS — M810 Age-related osteoporosis without current pathological fracture: Secondary | ICD-10-CM | POA: Diagnosis not present

## 2022-07-31 DIAGNOSIS — E11610 Type 2 diabetes mellitus with diabetic neuropathic arthropathy: Secondary | ICD-10-CM | POA: Diagnosis not present

## 2022-07-31 DIAGNOSIS — E785 Hyperlipidemia, unspecified: Secondary | ICD-10-CM | POA: Diagnosis not present

## 2022-07-31 NOTE — Patient Instructions (Signed)
DUE TO COVID-19 ONLY TWO VISITORS  (aged 78 and older)  ARE ALLOWED TO COME WITH YOU AND STAY IN THE WAITING ROOM ONLY DURING PRE OP AND PROCEDURE.   **NO VISITORS ARE ALLOWED IN THE SHORT STAY AREA OR RECOVERY ROOM!!**  IF YOU WILL BE ADMITTED INTO THE HOSPITAL YOU ARE ALLOWED ONLY FOUR SUPPORT PEOPLE DURING VISITATION HOURS ONLY (7 AM -8PM)   The support person(s) must pass our screening, gel in and out, and wear a mask at all times, including in the patient's room. Patients must also wear a mask when staff or their support person are in the room. Visitors GUEST BADGE MUST BE WORN VISIBLY  One adult visitor may remain with you overnight and MUST be in the room by 8 P.M.     Your procedure is scheduled on: 08/13/22   Report to [facility] Main Entrance    Report to admitting at  11:00 AM   Call this number if you have problems the morning of surgery [phone]   Do not eat food :After Midnight.   After Midnight you may have the following liquids until _10:30_____ AM/  DAY OF SURGERY  Water Black Coffee (sugar ok, NO MILK/CREAM OR CREAMERS)  Tea (sugar ok, NO MILK/CREAM OR CREAMERS) regular and decaf                             Plain Jell-O (NO RED)                                           Fruit ices (not with fruit pulp, NO RED)                                     Popsicles (NO RED)                                                                  Juice: apple, WHITE grape, WHITE cranberry Sports drinks like Gatorade (NO RED)              Drink 2 G2 drinks AT 10:00 PM the night before surgery.        The day of surgery:  Drink ONE  G2 at  10:15 AM the morning of surgery. Drink in one sitting. Do not sip.  This drink was given to you during your hospital  pre-op appointment visit. Nothing else to drink after completing the   G2. At 10:30 AM          If you have questions, please contact your surgeon's office.   FOLLOW BOWEL PREP AND ANY ADDITIONAL PRE OP  INSTRUCTIONS YOU RECEIVED FROM YOUR SURGEON'S OFFICE!!!   Drink plenty of fluids on day of surgery to prevent dehydration    Oral Hygiene is also important to reduce your risk of infection.  Remember - BRUSH YOUR TEETH THE MORNING OF SURGERY WITH YOUR REGULAR TOOTHPASTE   Do NOT smoke after Midnight   Take these medicines the morning of surgery with A SIP OF WATER: Zetia-Ezetimibe                                                                                                                            Levothyroxine- Synthroid  DO NOT TAKE ANY ORAL DIABETIC MEDICATIONS DAY OF YOUR SURGERY Metformin  Bring CPAP mask and tubing day of surgery.                              You may not have any metal on your body including hair pins, jewelry, and body piercing             Do not wear make-up, lotions, powders, perfumes/cologne, or deodorant  Do not wear nail polish including gel and S&S, artificial/acrylic nails, or any other type of covering on natural nails including finger and toenails. If you have artificial nails, gel coating, etc. that needs to be removed by a nail salon please have this removed prior to surgery or surgery may need to be canceled/ delayed if the surgeon/ anesthesia feels like they are unable to be safely monitored.   Do not shave  48 hours prior to surgery.     Do not bring valuables to the hospital. [place].   Contacts, dentures or bridgework may not be worn into surgery.   Bring small overnight bag day of surgery.   DO NOT [place]. PHARMACY WILL DISPENSE MEDICATIONS LISTED ON YOUR MEDICATION LIST TO YOU DURING YOUR ADMISSION [place]!       Special Instructions: Bring a copy of your healthcare power of attorney and living will documents  the day of surgery if you haven't scanned them before.              Please read over the  following fact sheets you were given: IF YOU HAVE QUESTIONS ABOUT YOUR PRE-OP INSTRUCTIONS PLEASE CALL [phone]     [facility] - Preparing for Surgery Before surgery, you can play an important role.  Because skin is not sterile, your skin needs to be as free of germs as possible.  You can reduce the number of germs on your skin by washing with CHG (chlorahexidine gluconate) soap before surgery.  CHG is an antiseptic cleaner which kills germs and bonds with the skin to continue killing germs even after washing. Please DO NOT use if you have an allergy to CHG or antibacterial soaps.  If your skin becomes reddened/irritated stop using the CHG and inform your nurse when you arrive at Short Stay. Do not shave (including legs and underarms) for at least 48 hours prior  to the first CHG shower. Please follow these instructions carefully:  1.  Shower with CHG Soap the night before surgery and the  morning of Surgery.  2.  If you choose to wash your hair, wash your hair first as usual with your  normal  shampoo.  3.  After you shampoo, rinse your hair and body thoroughly to remove the  shampoo.                            4.  Use CHG as you would any other liquid soap.  You can apply chg directly  to the skin and wash                       Gently with a scrungie or clean washcloth.  5.  Apply the CHG Soap to your body ONLY FROM THE NECK DOWN.   Do not use on face/ open                           Wound or open sores. Avoid contact with eyes, ears mouth and genitals (private parts).                       Wash face,  Genitals (private parts) with your normal soap.             6.  Wash thoroughly, paying special attention to the area where your surgery  will be performed.  7.  Thoroughly rinse your body with warm water from the neck down.  8.  DO NOT shower/wash with your normal soap after using and rinsing off  the CHG Soap.                9.  Pat yourself dry with a clean towel.            10.  Wear clean  pajamas.            11.  Place clean sheets on your bed the night of your first shower and do not  sleep with pets. Day of Surgery : Do not apply any lotions/deodorants the morning of surgery.  Please wear clean clothes to the hospital/surgery center.  FAILURE TO FOLLOW THESE INSTRUCTIONS MAY RESULT IN THE CANCELLATION OF YOUR SURGERY   ________________________________________________________________________   Incentive Spirometer  An incentive spirometer is a tool that can help keep your lungs clear and active. This tool measures how well you are filling your lungs with each breath. Taking long deep breaths may help reverse or decrease the chance of developing breathing (pulmonary) problems (especially infection) following: A long period of time when you are unable to move or be active. BEFORE THE PROCEDURE  If the spirometer includes an indicator to show your best effort, your nurse or respiratory therapist will set it to a desired goal. If possible, sit up straight or lean slightly forward. Try not to slouch. Hold the incentive spirometer in an upright position. INSTRUCTIONS FOR USE  Sit on the edge of your bed if possible, or sit up as far as you can in bed or on a chair. Hold the incentive spirometer in an upright position. Breathe out normally. Place the mouthpiece in your mouth and seal your lips tightly around it. Breathe in slowly and as deeply as possible, raising the piston or the ball toward the top of the column. Hold  your breath for 3-5 seconds or for as long as possible. Allow the piston or ball to fall to the bottom of the column. Remove the mouthpiece from your mouth and breathe out normally. Rest for a few seconds and repeat Steps 1 through 7 at least 10 times every 1-2 hours when you are awake. Take your time and take a few normal breaths between deep breaths. The spirometer may include an indicator to show your best effort. Use the indicator as a goal to work toward  during each repetition. After each set of 10 deep breaths, practice coughing to be sure your lungs are clear. If you have an incision (the cut made at the time of surgery), support your incision when coughing by placing a pillow or rolled up towels firmly against it. Once you are able to get out of bed, walk around indoors and cough well. You may stop using the incentive spirometer when instructed by your caregiver.  RISKS AND COMPLICATIONS Take your time so you do not get dizzy or light-headed. If you are in pain, you may need to take or ask for pain medication before doing incentive spirometry. It is harder to take a deep breath if you are having pain. AFTER USE Rest and breathe slowly and easily. It can be helpful to keep track of a log of your progress. Your caregiver can provide you with a simple table to help with this. If you are using the spirometer at home, follow these instructions: [place] IF:  You are having difficultly using the spirometer. You have trouble using the spirometer as often as instructed. Your pain medication is not giving enough relief while using the spirometer. You develop fever of 100.5 F (38.1 C) or higher. SEEK IMMEDIATE MEDICAL CARE IF:  You cough up bloody sputum that had not been present before. You develop fever of 102 F (38.9 C) or greater. You develop worsening pain at or near the incision site. MAKE SURE YOU:  Understand these instructions. Will watch your condition. Will get help right away if you are not doing well or get worse. Document Released: 02/09/2007 Document Revised: 12/22/2011 Document Reviewed: 04/12/2007 [facility] Patient Information 2014 [place], [place].   ________________________________________________________________________

## 2022-08-01 ENCOUNTER — Encounter (HOSPITAL_COMMUNITY): Payer: Self-pay

## 2022-08-01 ENCOUNTER — Other Ambulatory Visit: Payer: Self-pay

## 2022-08-01 ENCOUNTER — Encounter (HOSPITAL_COMMUNITY)
Admission: RE | Admit: 2022-08-01 | Discharge: 2022-08-01 | Disposition: A | Discharge status: Home / Self Care | Payer: PPO | Source: Ambulatory Visit | Attending: General Surgery | Admitting: General Surgery

## 2022-08-01 DIAGNOSIS — E119 Type 2 diabetes mellitus without complications: Secondary | ICD-10-CM

## 2022-08-01 DIAGNOSIS — Z01812 Encounter for preprocedural laboratory examination: Secondary | ICD-10-CM | POA: Insufficient documentation

## 2022-08-01 LAB — BASIC METABOLIC PANEL
Anion gap: 7 (ref 5–15)
BUN: 16 mg/dL (ref 8–23)
CO2: 26 mmol/L (ref 22–32)
Calcium: 8.7 mg/dL — ABNORMAL LOW (ref 8.9–10.3)
Chloride: 108 mmol/L (ref 98–111)
Creatinine, Ser: 0.85 mg/dL (ref 0.44–1.00)
GFR, Estimated: >60 mL/min (ref >60)
Glucose, Bld: 87 mg/dL (ref 70–99)
Potassium: 4.3 mmol/L (ref 3.5–5.1)
Sodium: 141 mmol/L (ref 135–145)

## 2022-08-01 LAB — TYPE AND SCREEN
ABO/RH(D): O POS
Antibody Screen: NEGATIVE

## 2022-08-01 LAB — CBC
HCT: 39.3 % (ref 36.0–46.0)
Hemoglobin: 12 g/dL (ref 12.0–15.0)
MCH: 29.1 pg (ref 26.0–34.0)
MCHC: 30.5 g/dL (ref 30.0–36.0)
MCV: 95.4 fL (ref 80.0–100.0)
Platelets: 262 10*3/uL (ref 150–400)
RBC: 4.12 MIL/uL (ref 3.87–5.11)
RDW: 17.3 % — ABNORMAL HIGH (ref 11.5–15.5)
WBC: 5.1 10*3/uL (ref 4.0–10.5)
nRBC: 0 % (ref 0.0–0.2)

## 2022-08-01 LAB — GLUCOSE, CAPILLARY: Glucose-Capillary: 86 mg/dL (ref 70–99)

## 2022-08-01 NOTE — Progress Notes (Signed)
Anesthesia note:  Bowel prep reminder:yes  PCP - Dr. Viona Gilmore pharr Cardiologist -none Other-   Chest x-ray - CT chest 03/13/22-epic EKG - 02/20/22-epic Stress Test - no ECHO - no Cardiac Cath - no  Pacemaker/ICD device last checked:NA  Sleep Study - yes wasn't able to tolerate the mask. Tried for 2-3 months CPAP -    Fasting Blood Sugar - 106-118 Checks Blood Sugar __2-3 times a week___  Blood Thinner:ASA 81/ Dr. Shelia Media Blood Thinner Instructions: Aspirin Instructions:Stop 7 days prior to DOS/ Dr. Shelia Media Last Dose:08/05/22  Anesthesia review: no  Patient denies shortness of breath, fever, cough and chest pain at PAT appointment Pt has no SOB with activities. Pt said that Dr. Marcello Moores did mention the possibility of a colostomy with the lower anterior resection. The ostomy RN was notified.  Patient verbalized understanding of instructions that were given to them at the PAT appointment. Patient was also instructed that they will need to review over the PAT instructions again at home before surgery. yes

## 2022-08-08 ENCOUNTER — Encounter (HOSPITAL_BASED_OUTPATIENT_CLINIC_OR_DEPARTMENT_OTHER): Payer: PPO | Admitting: General Surgery

## 2022-08-08 DIAGNOSIS — L97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: Secondary | ICD-10-CM | POA: Diagnosis not present

## 2022-08-08 DIAGNOSIS — T8131XA Disruption of external operation (surgical) wound, not elsewhere classified, initial encounter: Secondary | ICD-10-CM | POA: Diagnosis not present

## 2022-08-08 NOTE — Progress Notes (Signed)
Destiny Destiny [name] ([id number]) [id number] Page 1 of 9 Visit Report for 08/08/2022 Chief Complaint Document Details Patient Name: Date of Service: Destiny Destiny [name], Destiny Destiny [name] 08/08/2022 8:30 A M Medical Record Number: [id number] Patient Account Number: [id number] Date of Birth/Sex: Treating RN: 1946-05-06 (76 y.o. Destiny [name]) Primary Care Provider: Deland [name] Other Clinician: Referring Provider: Treating Provider/Extender: Destiny Destiny [name] in Treatment: 11 Information Obtained from: Patient Chief Complaint Patient presents to the wound care center with open non-healing surgical wound(s) Electronic Signature(s) Signed: 08/08/2022 8:58:28 AM By: Destiny Maudlin MD FACS Entered By: Destiny Destiny [name] on 08/08/2022 08:58:28 -------------------------------------------------------------------------------- Debridement Details Patient Name: Date of Service: Destiny Dark Destiny [name]. 08/08/2022 8:30 A M Medical Record Number: [id number] Patient Account Number: [id number] Date of Birth/Sex: Treating RN: May 22, 1946 (76 y.o. Destiny Destiny [name] Primary Care Provider: Deland [name] Other Clinician: Referring Provider: Treating Provider/Extender: Destiny Destiny [name] in Treatment: 11 Debridement Performed for Assessment: Wound #1 Left Calcaneus Performed By: Physician Destiny Maudlin, MD Debridement Type: Debridement Level of Consciousness (Pre-procedure): Awake and Alert Pre-procedure Verification/Time Out Yes - 08:49 Taken: Start Time: 08:49 Pain Control: Lidocaine 4% T opical Solution T Area Debrided (L x W): otal 0.3 (cm) x 0.9 (cm) = 0.27 (cm) Tissue and other material debrided: Non-Viable, Eschar Level: Non-Viable Tissue Debridement Description: Selective/Open Wound Instrument: Curette Bleeding: Minimum Hemostasis Achieved: Pressure Response to Treatment: Procedure was tolerated well Level of Consciousness (Post- Awake and  Alert procedure): Post Debridement Measurements of Total Wound Length: (cm) 0.3 Width: (cm) 0.9 Depth: (cm) 0.1 Volume: (cm) 0.021 Character of Wound/Ulcer Post Debridement: Improved Post Procedure Diagnosis Same as Pre-procedure Destiny Destiny [name], Destiny Destiny [name] ([id number]) [id number] Page 2 of 9 Notes scribed for Dr. Celine [name] by Destiny Peals, RN Electronic Signature(s) Signed: 08/08/2022 9:14:53 AM By: Destiny Maudlin MD FACS Signed: 08/08/2022 11:04:44 AM By: Destiny Catholic RN Entered By: Destiny Destiny [name] on 08/08/2022 08:52:28 -------------------------------------------------------------------------------- HPI Details Patient Name: Date of Service: Destiny Dark Destiny [name]. 08/08/2022 8:30 A M Medical Record Number: [id number] Patient Account Number: [id number] Date of Birth/Sex: Treating RN: [birth year] (76 y.o. 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 After the final reexcision, the wound had an ACell graft applied. She reports that she had good closure of skin over the site. Earlier this year, at the end of May, however, she was diagnosed with rectal cancer and is currently undergoing neoadjuvant therapy (chemotherapy and radiation) in anticipation of future surgery. She reports that about 2 weeks ago, she noted that her skin coverage over the heel was beginning to break down. Her oncologist felt that perhaps the Xeloda was contributing to wound failure and stopped this medication. She continues to receive neoadjuvant radiation therapy, however. She saw plastic surgery last week, and they referred her to the wound care center for further evaluation and management. She is a  type II diabetic, well controlled, with the most recent A1c available to me being 5.9%. ABIs done 1 year ago were normal. On the patient's left heel, there is an irregular wound with some skin maceration. The fat layer is exposed and there is slough over the entire surface. Outside of the area of maceration, the skin is in good condition. No erythema, induration, or odor. No concern for infection. 05/26/2022: The wound is smaller today and is  extremely clean, without any slough buildup. The periwound skin is in much better condition and is no longer macerated. She is scheduled to complete her radiation treatment on Wednesday. 06/03/2022: The wound continues to contract. There is just a little bit of eschar and callus accumulation. The surface is clean. She has completed all of her neoadjuvant therapy and is awaiting surgery in November. 06/10/2022: The wound is smaller by about a centimeter in all dimensions. She does have a little bit of eschar near the Achilles portion of the wound. The wound surface itself is robust and healthy-appearing. 06/24/2022: The wound continues to contract. There is just a small portion open in the center. She does have some dry skin and periwound callus circumferentially. No concern for infection. 07/08/2022: The wound is down to just a pinhole. There is a little bit of eschar on the surface. No concern for infection. 07/22/2022: Her wound is healed. 08/08/2022: Unfortunately, it seems that when her wound was being dressed at her last visit, the nurse applying the dressing pulled some dead skin off and reopened to the wound. I am not sure why this was not brought to my attention, but the patient has been caring for the wound at home with her supplies from her previous admission. She has been wearing her heel off loader and using Hydrofera Blue. There is some eschar accumulation, but no erythema, induration, or other concern for infection. Her colorectal surgery is scheduled  for next Wednesday. Electronic Signature(s) Signed: 08/08/2022 8:59:34 AM By: Destiny Maudlin MD FACS Entered By: Destiny Destiny [name] on 08/08/2022 08:59:34 -------------------------------------------------------------------------------- Physical Exam Details Patient Name: Date of Service: Destiny Dark Destiny [name]. 08/08/2022 8:30 A M Medical Record Number: [id number] Patient Account Number: [id number] Date of Birth/Sex: Treating RN: 1946-10-05 (76 y.o. Destiny [name]) Primary Care Provider: Deland [name] Other Clinician: Referring Provider: Treating Provider/Extender: Ndidi, Nesby, Lurene Shadow ([id number]) [id number] Page 3 of 9 Weeks in Treatment: 11 Constitutional . . . . No acute distress.Marland Kitchen Respiratory Normal work of breathing on room air.. Notes 08/08/2022: There is a layer of eschar underneath which a very superficial reopening of her wound is present. There is no concern for infection. Electronic Signature(s) Signed: 08/08/2022 9:02:27 AM By: Destiny Maudlin MD FACS Entered By: Destiny Destiny [name] on 08/08/2022 09:02:27 -------------------------------------------------------------------------------- Physician Orders Details Patient Name: Date of Service: Destiny Dark Destiny [name]. 08/08/2022 8:30 A M Medical Record Number: [id number] Patient Account Number: [id number] Date of Birth/Sex: Treating RN: [birth year] (76 y.o. Destiny Destiny [name] Primary Care Provider: Deland [name] Other Clinician: Referring Provider: Treating Provider/Extender: Destiny Destiny [name] in Treatment: 11 Verbal / Phone Orders: No Diagnosis Coding ICD-10 Coding Code Description [phone] Non-pressure chronic ulcer of left heel and midfoot with fat layer exposed T81.31XS Disruption of external operation (surgical) wound, not elsewhere classified, sequela C43.9 Malignant melanoma of skin, unspecified C20 Malignant neoplasm of rectum E11.9 Type 2 diabetes mellitus  without complications B44.96 Personal history of antineoplastic chemotherapy Z92.3 Personal history of irradiation Follow-up Appointments Return appointment in 1 month. - Dr. Celine [name] - room 2 Anesthetic Wound #1 Left Calcaneus (In clinic) Topical Lidocaine 4% applied to wound bed Bathing/ Shower/ Hygiene Other Bathing/Shower/Hygiene Orders/Instructions: - Change wound dressing after bathing Edema Control - Lymphedema / SCD / Other Avoid standing for long periods of time. Moisturize legs daily. Off-Loading Other: - Please wear off-loading shoe on left foot to keep pressure off the heel Wound Treatment Wound #1 - Calcaneus Wound Laterality: Left Cleanser: Soap and Water 1 x Per  Day/30 Days Discharge Instructions: May shower and wash wound with dial antibacterial soap and water prior to dressing change. Cleanser: Wound Cleanser 1 x Per Day/30 Days Discharge Instructions: Cleanse the wound with wound cleanser prior to applying a clean dressing using gauze sponges, not tissue or cotton balls. Prim Dressing: Hydrofera Blue Ready Foam, 2.5 x2.5 in (Generic) 1 x Per Day/30 Days ary Discharge Instructions: Apply to wound bed as instructed Destiny Destiny [name], Destiny Destiny [name] ([id number]) [id number] Page 4 of 9 Secondary Dressing: ALLEVYN Heel 4 1/2in x 5 1/2in / 10.5cm x 13.5cm 1 x Per Day/30 Days Discharge Instructions: Apply over primary dressing as directed. Secondary Dressing: Woven Gauze Sponge, Non-Sterile 4x4 in 1 x Per Day/30 Days Discharge Instructions: Apply over primary dressing as directed. Secured With: Elastic Bandage 4 inch (ACE bandage) 1 x Per Day/30 Days Discharge Instructions: Secure with ACE bandage as directed. Secured With: [employer], 4.5x3.1 (in/yd) 1 x Per Day/30 Days Discharge Instructions: Secure with Kerlix as directed. Secured With: 18M Medipore Public affairs consultant Surgical T 2x10 (in/yd) (Generic) 1 x Per Day/30 Days ape Discharge Instructions: Secure  with tape as directed. Patient Medications llergies: propofol, aspirin, atorvastatin, Lisinopril, hydrocodone, penicillin A Notifications Medication Indication Start End 08/08/2022 lidocaine DOSE topical 4 % cream - cream topical Electronic Signature(s) Signed: 08/08/2022 9:14:53 AM By: Destiny Maudlin MD FACS Entered By: Destiny Destiny [name] on 08/08/2022 09:02:40 -------------------------------------------------------------------------------- Problem List Details Patient Name: Date of Service: Destiny Dark Destiny [name]. 08/08/2022 8:30 A M Medical Record Number: [id number] Patient Account Number: [id number] Date of Birth/Sex: Treating RN: 08-20-1946 (75 y.o. Destiny [name]) Primary Care Provider: Deland [name] Other Clinician: Referring Provider: Treating Provider/Extender: Destiny Destiny [name] in Treatment: 11 Active Problems ICD-10 Encounter Code Description Active Date MDM Diagnosis [phone] Non-pressure chronic ulcer of left heel and midfoot with fat layer exposed 05/19/2022 No Yes T81.31XS Disruption of external operation (surgical) wound, not elsewhere classified, 05/19/2022 No Yes sequela C43.9 Malignant melanoma of skin, unspecified 05/19/2022 No Yes C20 Malignant neoplasm of rectum 05/19/2022 No Yes E11.9 Type 2 diabetes mellitus without complications 03/20/3418 No Yes Z92.21 Personal history of antineoplastic chemotherapy 05/19/2022 No Yes Destiny Destiny [name], Destiny Destiny [name] ([id number]) [id number] Page 5 of 9 Z92.3 Personal history of irradiation 05/19/2022 No Yes Inactive Problems Resolved Problems Electronic Signature(s) Signed: 08/08/2022 8:57:58 AM By: Destiny Maudlin MD FACS Entered By: Destiny Destiny [name] on 08/08/2022 08:57:57 -------------------------------------------------------------------------------- Progress Note Details Patient Name: Date of Service: Destiny Dark Destiny [name]. 08/08/2022 8:30 A M Medical Record Number: [id number] Patient Account Number:  [id number] Date of Birth/Sex: Treating RN: Jul 12, 1946 (76 y.o. 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 After the final reexcision, the wound had an ACell graft applied. She reports that she had good closure of skin over the site. Earlier this year, at the end of May, however, she was diagnosed with rectal cancer and is currently undergoing neoadjuvant therapy (chemotherapy and radiation) in anticipation of future surgery. She reports that about 2 weeks ago, she noted that her skin coverage over the heel was beginning to break down. Her oncologist felt that perhaps the Xeloda was contributing to wound failure and stopped this medication. She continues to receive neoadjuvant radiation therapy, however.  She saw plastic surgery last week, and they referred her to the wound care center for further evaluation and management. She is a type II diabetic, well controlled, with the most recent A1c available to me being 5.9%. ABIs done 1 year ago were normal. On the patient's left heel, there is an irregular wound with some skin maceration. The fat layer is exposed and there is slough over the entire surface. Outside of the area of maceration, the skin is in good condition. No erythema, induration, or odor. No concern for infection. 05/26/2022: The wound is smaller today and is extremely clean, without any slough buildup. The periwound skin is in much better condition and is no longer macerated. She is scheduled to complete her radiation treatment on Wednesday. 06/03/2022: The wound  continues to contract. There is just a little bit of eschar and callus accumulation. The surface is clean. She has completed all of her neoadjuvant therapy and is awaiting surgery in November. 06/10/2022: The wound is smaller by about a centimeter in all dimensions. She does have a little bit of eschar near the Achilles portion of the wound. The wound surface itself is robust and healthy-appearing. 06/24/2022: The wound continues to contract. There is just a small portion open in the center. She does have some dry skin and periwound callus circumferentially. No concern for infection. 07/08/2022: The wound is down to just a pinhole. There is a little bit of eschar on the surface. No concern for infection. 07/22/2022: Her wound is healed. 08/08/2022: Unfortunately, it seems that when her wound was being dressed at her last visit, the nurse applying the dressing pulled some dead skin off and reopened to the wound. I am not sure why this was not brought to my attention, but the patient has been caring for the wound at home with her supplies from her previous admission. She has been wearing her heel off loader and using Hydrofera Blue. There is some eschar accumulation, but no erythema, induration, or other concern for infection. Her colorectal surgery is scheduled for next Wednesday. Patient History Information obtained from Patient. Social History Former smoker - 1991, Marital Status - Divorced, Alcohol Use - Never, Drug Use - No History, Caffeine Use - Moderate - Diet coke,coffee. Medical History Endocrine Destiny Destiny [name], Destiny Destiny [name] ([id number]) [id number] Page 6 of 9 Patient has history of Type II Diabetes - Metformin pillsand Ozempic Musculoskeletal Patient has history of Osteoarthritis Hospitalization/Surgery History - Melanoma excision to left heel;ORIF Left ankle fracture;Marland Kitchen Medical A Surgical History Notes nd Eyes Macular  Degeneration Ear/Nose/Mouth/Throat Tinnitus Oncologic 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 has had Chemotherapy (stopped) and Radiation -8 treatments left as of 05/19/22 Rectal cancer Psychiatric Hx Depression Objective Constitutional No acute distress.. Vitals Time Taken: 8:26 AM, Height: 63 in, Temperature: 97.8 Destiny [name], Pulse: 73 bpm, Respiratory Rate: 16 breaths/min, Blood Pressure: 139/82 mmHg. Respiratory Normal work of breathing on room air.. General Notes: 08/08/2022: There is a layer of eschar underneath which a very superficial reopening of her wound is present. There is no concern for infection. Integumentary (Hair, Skin) Wound #1 status is Open. Original cause of wound was Hematoma. The date acquired was: 05/05/2022. The wound has been in treatment 11 weeks. The wound is located on the Left Calcaneus. The wound measures 0.3cm length x 0.9cm width x 0.1cm depth; 0.212cm^2 area and 0.021cm^3 volume. There is Fat Layer (Subcutaneous Tissue) exposed. There is no tunneling or undermining noted. There is a medium amount of serosanguineous drainage noted. The wound margin is flat and  intact. There is small (1-33%) red granulation within the wound bed. There is a large (67-100%) amount of necrotic tissue within the wound bed including Eschar and Adherent Slough. The periwound skin appearance had no abnormalities noted for color. The periwound skin appearance exhibited: Callus, Dry/Scaly. The periwound skin appearance did not exhibit: Maceration. Assessment Active Problems ICD-10 Non-pressure chronic ulcer of left heel and midfoot with fat layer exposed Disruption of external operation (surgical) wound, not elsewhere classified, sequela Malignant melanoma of skin, unspecified Malignant neoplasm of rectum Type 2 diabetes mellitus without complications Personal history of antineoplastic chemotherapy Personal history of irradiation Procedures Wound #1 Pre-procedure diagnosis of Wound #1 is a Trauma, Other  located on the Left Calcaneus . There was a Selective/Open Wound Non-Viable Tissue Debridement with a total area of 0.27 sq cm performed by Destiny Maudlin, MD. With the following instrument(s): Curette to remove Non-Viable tissue/material. Material removed includes Eschar after achieving pain control using Lidocaine 4% T opical Solution. No specimens were taken. A time out was conducted at 08:49, prior to the start of the procedure. A Minimum amount of bleeding was controlled with Pressure. The procedure was tolerated well. Post Debridement Measurements: 0.3cm length x 0.9cm width x 0.1cm depth; 0.021cm^3 volume. Character of Wound/Ulcer Post Debridement is improved. Post procedure Diagnosis Wound #1: Same as Pre-Procedure General Notes: scribed for Dr. Celine [name] by Destiny Peals, RN. Plan Destiny Destiny [name], Destiny Destiny [name] ([id number]) [id number] Page 7 of 9 Follow-up Appointments: Return appointment in 1 month. - Dr. Celine [name] - room 2 Anesthetic: Wound #1 Left Calcaneus: (In clinic) Topical Lidocaine 4% applied to wound bed Bathing/ Shower/ Hygiene: Other Bathing/Shower/Hygiene Orders/Instructions: - Change wound dressing after bathing Edema Control - Lymphedema / SCD / Other: Avoid standing for long periods of time. Moisturize legs daily. Off-Loading: Other: - Please wear off-loading shoe on left foot to keep pressure off the heel The following medication(s) was prescribed: lidocaine topical 4 % cream cream topical was prescribed at facility WOUND #1: - Calcaneus Wound Laterality: Left Cleanser: Soap and Water 1 x Per Day/30 Days Discharge Instructions: May shower and wash wound with dial antibacterial soap and water prior to dressing change. Cleanser: Wound Cleanser 1 x Per Day/30 Days Discharge Instructions: Cleanse the wound with wound cleanser prior to applying a clean dressing using gauze sponges, not tissue or cotton balls. Prim Dressing: Hydrofera Blue Ready Foam,  2.5 x2.5 in (Generic) 1 x Per Day/30 Days ary Discharge Instructions: Apply to wound bed as instructed Secondary Dressing: ALLEVYN Heel 4 1/2in x 5 1/2in / 10.5cm x 13.5cm 1 x Per Day/30 Days Discharge Instructions: Apply over primary dressing as directed. Secondary Dressing: Woven Gauze Sponge, Non-Sterile 4x4 in 1 x Per Day/30 Days Discharge Instructions: Apply over primary dressing as directed. Secured With: Elastic Bandage 4 inch (ACE bandage) 1 x Per Day/30 Days Discharge Instructions: Secure with ACE bandage as directed. Secured With: [employer], 4.5x3.1 (in/yd) 1 x Per Day/30 Days Discharge Instructions: Secure with Kerlix as directed. Secured With: 66M Medipore Public affairs consultant Surgical T 2x10 (in/yd) (Generic) 1 x Per Day/30 Days ape Discharge Instructions: Secure with tape as directed. 08/08/2022: There is a layer of eschar underneath which a very superficial reopening of her wound is present. There is no concern for infection. I used a curette to debride the eschar from her wound. Fortunately, it is small and superficial. We will continue her regimen of Hydrofera Blue, he will offload her, and foam cup. She will come back for a visit in about  a month, after she has recovered from her low anterior resection. Electronic Signature(s) Signed: 08/08/2022 9:03:23 AM By: Destiny Maudlin MD FACS Entered By: Destiny Destiny [name] on 08/08/2022 09:03:23 -------------------------------------------------------------------------------- HxROS Details Patient Name: Date of Service: Destiny Dark Destiny [name]. 08/08/2022 8:30 A M Medical Record Number: [id number] Patient Account Number: [id number] Date of Birth/Sex: Treating RN: Apr 08, 1946 (76 y.o. Destiny [name]) Primary Care Provider: Deland [name] Other Clinician: Referring Provider: Treating Provider/Extender: Destiny Destiny [name] in Treatment: 11 Information Obtained From Patient Eyes Medical History: Past Medical History Notes: Macular  Degeneration Ear/Nose/Mouth/Throat Medical History: Past Medical History Notes: Tinnitus Endocrine Medical History: Positive for: Type II Diabetes - Metformin pillsand Destiny Destiny [name], Destiny Destiny [name] ([id number]) [id number] Page 8 of 9 Time with diabetes: 25 years Treated with: Oral agents Blood sugar tested every day: Yes Tested : every day Musculoskeletal Medical History: Positive for: Osteoarthritis Oncologic Medical History: Past Medical History Notes: 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 has had Chemotherapy (stopped) and Radiation -8 treatments left as of 05/19/22 Rectal cancer Psychiatric Medical History: Past Medical History Notes: Hx Depression Immunizations Pneumococcal Vaccine: Received Pneumococcal Vaccination: Yes Received Pneumococcal Vaccination On or After 60th Birthday: Yes Implantable Devices None Hospitalization / Surgery History Type of Hospitalization/Surgery Melanoma excision to left heel;ORIF Left ankle fracture; Family and Social History Former smoker - 1991; Marital Status - Divorced; Alcohol Use: Never; Drug Use: No History; Caffeine Use: Moderate - Diet coke,coffee; Financial Concerns: No; Food, Clothing or Shelter Needs: No; Support System Lacking: No; Transportation Concerns: No Electronic Signature(s) Signed: 08/08/2022 9:14:53 AM By: Destiny Maudlin MD FACS Entered By: Destiny Destiny [name] on 08/08/2022 08:59:48 -------------------------------------------------------------------------------- SuperBill Details Patient Name: Date of Service: Destiny Destiny [name] 08/08/2022 Medical Record Number: [id number] Patient Account Number: [id number] Date of Birth/Sex: Treating RN: [birth year] (76 y.o. Destiny [name]) Primary Care Provider: Deland [name] Other Clinician: Referring Provider: Treating Provider/Extender: Destiny Destiny [name] in Treatment: 11 Diagnosis Coding ICD-10 Codes Code Description [phone] Non-pressure chronic ulcer of  left heel and midfoot with fat layer exposed T81.31XS Disruption of external operation (surgical) wound, not elsewhere classified, sequela C43.9 Malignant melanoma of skin, unspecified C20 Malignant neoplasm of rectum E11.9 Type 2 diabetes mellitus without complications Y30.16 Personal history of antineoplastic chemotherapy Z92.3 Personal history of irradiation Facility Procedures Destiny Destiny [name], Destiny Destiny [name] ([id number]): CPT4 Code Description [id number] - DEBRIDE WOUND 1ST 20 SQ CM OR < ICD-10 Diagnosis Description L97.422 Non-pressure chronic ulcer of left heel and midfoot with fat layer [id number] Page 9 of 9: Modifier Quantity 1 exposed Physician Procedures : CPT4 Code Description Modifier [id number] - WC PHYS LEVEL 3 - EST PT 25 ICD-10 Diagnosis Description L97.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 T81.31XS Disruption of external operation (surgical) wound, not  elsewhere classified, sequela C43.9 Malignant melanoma of skin, unspecified Z92.21 Personal history of antineoplastic chemotherapy Quantity: 1 : [id number] - WC PHYS DEBR WO ANESTH 20 SQ CM ICD-10 Diagnosis Description L97.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 Quantity: 1 Electronic Signature(s) Signed: 08/08/2022 9:03:51 AM By: Destiny Maudlin MD FACS Entered By: Destiny Destiny [name] on 08/08/2022 09:03:50

## 2022-08-08 NOTE — Progress Notes (Signed)
Destiny Destiny [name], Destiny Destiny [name] ([id number]) [id number] Page 1 of 7 Visit Report for 08/08/2022 Arrival Information Details Patient Name: Date of Service: Destiny Destiny [name], Destiny Destiny [name] 08/08/2022 8:30 A M Medical Record Number: [id number] Patient Account Number: [id number] Date of Birth/Sex: Treating RN: January 27, 1946 (76 y.o. Destiny Destiny [name] Primary Care Riti Rollyson: Deland Pretty Other Clinician: Referring Chanley Mcenery: Treating Quintus Premo/Extender: Raylene Everts in Treatment: 11 Visit Information History Since Last Visit Added or deleted any medications: No Patient Arrived: Ambulatory Any new allergies or adverse reactions: No Arrival Time: 08:26 Had a fall or experienced change in No Accompanied By: self activities of daily living that may affect Transfer Assistance: None risk of falls: Patient Identification Verified: Yes Signs or symptoms of abuse/neglect since last visito No Patient Requires Transmission-Based Precautions: No Hospitalized since last visit: No Patient Has Alerts: No Implantable device outside of the clinic excluding No cellular tissue based products placed in the center since last visit: Has Dressing in Place as Prescribed: Yes Pain Present Now: No Electronic Signature(s) Signed: 08/08/2022 11:04:44 AM By: Dellie Catholic RN Entered By: Dellie Catholic on 08/08/2022 08:27:13 -------------------------------------------------------------------------------- Encounter Discharge Information Details Patient Name: Date of Service: Destiny Dark Destiny [name]. 08/08/2022 8:30 A M Medical Record Number: [id number] Patient Account Number: [id number] Date of Birth/Sex: Treating RN: December 29, 1945 (76 y.o. Destiny Destiny [name] Primary Care Chrisa Hassan: Deland Pretty Other Clinician: Referring Jarious Lyon: Treating Cru Kritikos/Extender: Raylene Everts in Treatment: 11 Encounter Discharge Information Items Post Procedure Vitals Discharge  Condition: Stable Temperature (Destiny [name]): 97.8 Ambulatory Status: Ambulatory Pulse (bpm): 73 Discharge Destination: Home Respiratory Rate (breaths/min): 16 Transportation: Private Auto Blood Pressure (mmHg): 139/82 Accompanied By: self Schedule Follow-up Appointment: Yes Clinical Summary of Care: Patient Declined Electronic Signature(s) Signed: 08/08/2022 4:25:07 PM By: Adline Peals Entered By: Adline Peals on 08/08/2022 09:39:11 Boye, Destiny Destiny [name] ([id number]) [id number] Page 2 of 7 -------------------------------------------------------------------------------- Lower Extremity Assessment Details Patient Name: Date of Service: Destiny Destiny [name], Destiny Destiny [name] 08/08/2022 8:30 A M Medical Record Number: [id number] Patient Account Number: [id number] Date of Birth/Sex: Treating RN: 12-Aug-1946 (76 y.o. Destiny Destiny [name] Primary Care Richardson Dubree: Deland Pretty Other Clinician: Referring Ardis Fullwood: Treating Juaquina Machnik/Extender: Raylene Everts in Treatment: 11 Edema Assessment Assessed: [name]: No] Patrice Paradise: No] Edema: [Left: N] [Right: o] Calf Left: Right: Point of Measurement: 29 cm From Medial Instep 34.1 cm Ankle Left: Right: Point of Measurement: 10 cm From Medial Instep 21 cm Vascular Assessment Pulses: Dorsalis Pedis Palpable: [Left:Yes] Electronic Signature(s) Signed: 08/08/2022 11:04:44 AM By: Dellie Catholic RN Entered By: Dellie Catholic on 08/08/2022 08:30:23 -------------------------------------------------------------------------------- Multi Wound Chart Details Patient Name: Date of Service: Destiny Dark Destiny [name]. 08/08/2022 8:30 A M Medical Record Number: [id number] Patient Account Number: [id number] Date of Birth/Sex: Treating RN: 1946/05/04 (76 y.o. Destiny [name]) Primary Care Asher Babilonia: Deland Pretty Other Clinician: Referring Lachanda Buczek: Treating Jaqueline Uber/Extender: Raylene Everts in Treatment: 11 Vital  Signs Height(in): 63 Pulse(bpm): 73 Weight(lbs): Blood Pressure(mmHg): 139/82 Body Mass Index(BMI): Temperature(Destiny [name]): 97.8 Respiratory Rate(breaths/min): 16 [1:Photos:] [N/A:N/A] Left Calcaneus N/A N/A Wound Location: Hematoma N/A N/A Wounding Event: Trauma, Other N/A N/A Primary Etiology: Type II Diabetes, Osteoarthritis N/A N/A Comorbid History: 05/05/2022 N/A N/A Date Acquired: 11 N/A N/A Weeks of Treatment: Open N/A N/A Wound Status: No N/A N/A Wound Recurrence: 0.3x0.9x0.1 N/A N/A Measurements L x W x D (cm) 0.212 N/A N/A A (cm) : rea 0.021 N/A N/A Volume (cm) : 96.90% N/A N/A % Reduction in A rea: 98.50% N/A N/A % Reduction in Volume: Full Thickness Without Exposed N/A  N/A Classification: Support Structures Medium N/A N/A Exudate A mount: Serosanguineous N/A N/A Exudate Type: red, [name] N/A N/A Exudate Color: Flat and Intact N/A N/A Wound Margin: Small (1-33%) N/A N/A Granulation A mount: Red N/A N/A Granulation Quality: Large (67-100%) N/A N/A Necrotic A mount: Eschar, Adherent Slough N/A N/A Necrotic Tissue: Fat Layer (Subcutaneous Tissue): Yes N/A N/A Exposed Structures: Fascia: No Tendon: No Muscle: No Joint: No Bone: No None N/A N/A Epithelialization: Debridement - Selective/Open Wound N/A N/A Debridement: Pre-procedure Verification/Time Out 08:49 N/A N/A Taken: Lidocaine 4% Topical Solution N/A N/A Pain Control: Necrotic/Eschar N/A N/A Tissue Debrided: Non-Viable Tissue N/A N/A Level: 0.27 N/A N/A Debridement A (sq cm): rea Curette N/A N/A Instrument: Minimum N/A N/A Bleeding: Pressure N/A N/A Hemostasis A chieved: Procedure was tolerated well N/A N/A Debridement Treatment Response: 0.3x0.9x0.1 N/A N/A Post Debridement Measurements L x W x D (cm) 0.021 N/A N/A Post Debridement Volume: (cm) Callus: Yes N/A N/A Periwound Skin Texture: Dry/Scaly: Yes N/A N/A Periwound Skin Moisture: Maceration: No No  Abnormalities Noted N/A N/A Periwound Skin Color: Debridement N/A N/A Procedures Performed: Treatment Notes Electronic Signature(s) Signed: 08/08/2022 8:58:20 AM By: Fredirick Maudlin MD FACS Entered By: Fredirick Maudlin on 08/08/2022 08:58:20 -------------------------------------------------------------------------------- Multi-Disciplinary Care Plan Details Patient Name: Date of Service: Destiny Dark Destiny [name]. 08/08/2022 8:30 A M Medical Record Number: [id number] Patient Account Number: [id number] Date of Birth/Sex: Treating RN: 01/08/1946 (76 y.o. Destiny Destiny [name] Primary Care Jasiyah Poland: Deland Pretty Other Clinician: Referring Balbina Depace: Treating Jalayna Josten/Extender: Raylene Everts in Treatment: [place] reviewed with physician Active Inactive Necrotic Tissue Destiny Destiny [name], Destiny Destiny [name] ([id number]) [id number] Page 4 of 7 Nursing Diagnoses: Impaired tissue integrity related to necrotic/devitalized tissue Knowledge deficit related to management of necrotic/devitalized tissue Goals: Necrotic/devitalized tissue will be minimized in the wound bed Date Initiated: 08/08/2022 Target Resolution Date: 09/19/2022 Goal Status: Active Patient/caregiver will verbalize understanding of reason and process for debridement of necrotic tissue Date Initiated: 08/08/2022 Target Resolution Date: 09/19/2022 Goal Status: Active Interventions: Assess patient pain level pre-, during and post procedure and prior to discharge Provide education on necrotic tissue and debridement process Treatment Activities: Apply topical anesthetic as ordered : 08/08/2022 Notes: Electronic Signature(s) Signed: 08/08/2022 4:25:07 PM By: Adline Peals Entered By: Adline Peals on 08/08/2022 09:38:41 -------------------------------------------------------------------------------- Pain Assessment Details Patient Name: Date of Service: Destiny Dark Destiny [name]. 08/08/2022 8:30 A M Medical Record Number: [id number] Patient Account Number: [id number] Date of Birth/Sex: Treating RN: 1946-01-22 (76 y.o. Destiny Destiny [name] Primary Care Macoy Rodwell: Deland Pretty Other Clinician: Referring Malikhi Ogan: Treating Shariq Puig/Extender: Raylene Everts in Treatment: 11 Active Problems Location of Pain Severity and Description of Pain Patient Has Paino No Site Locations Pain Management and Medication Current Pain Management: Electronic Signature(s) Signed: 08/08/2022 11:04:44 AM By: Dellie Catholic RN Entered By: Dellie Catholic on 08/08/2022 08:27:52 Karlen, Destiny Destiny [name] ([id number]) [id number] Page 5 of 7 -------------------------------------------------------------------------------- Patient/Caregiver Education Details Patient Name: Date of Service: Destiny Destiny [name], Destiny Destiny [name] 10/27/2023andnbsp8:30 A M Medical Record Number: [id number] Patient Account Number: [id number] Date of Birth/Gender: Treating RN: 08/01/1946 (76 y.o. Destiny Destiny [name] Primary Care Physician: Deland Pretty Other Clinician: Referring Physician: Treating Physician/Extender: Raylene Everts in Treatment: 11 Education Assessment Education Provided To: Patient Education Topics Provided Wound/Skin Impairment: Methods: Explain/Verbal Responses: Reinforcements needed, State content correctly [employer]) Signed: 08/08/2022 11:04:44 AM By: Dellie Catholic RN Entered By: Dellie Catholic on 08/08/2022 08:54:36 -------------------------------------------------------------------------------- Wound Assessment Details Patient Name: Date of Service: Destiny American.  08/08/2022 8:30 A M Medical Record Number: [id number] Patient Account Number: [id number] Date of Birth/Sex: Treating RN: 01-26-1946 (76 y.o. Destiny Destiny [name] Primary Care Patrici Minnis: Deland Pretty Other Clinician: Referring Glenda Spelman: Treating  Aodhan Scheidt/Extender: Raylene Everts in Treatment: 11 Wound Status Wound Number: 1 Primary Etiology: Trauma, Other Wound Location: Left Calcaneus Wound Status: Open Wounding Event: Hematoma Comorbid History: Type II Diabetes, Osteoarthritis Date Acquired: 05/05/2022 Weeks Of Treatment: 11 Clustered Wound: No Photos Wound Measurements Length: (cm) 0.3 Destiny Destiny [name], Destiny Destiny [name] ([id number]) Width: (cm) Depth: (cm) Area: (cm) Volume: (cm) % Reduction in Area: 96.9% [id number] Page 6 of 7 0.9 % Reduction in Volume: 98.5% 0.1 Epithelialization: None 0.212 Tunneling: No 0.021 Undermining: No Wound Description Classification: Full Thickness Without Exposed Support Structures Wound Margin: Flat and Intact Exudate Amount: Medium Exudate Type: Serosanguineous Exudate Color: red, [name] Foul Odor After Cleansing: No Slough/Fibrino No Wound Bed Granulation Amount: Small (1-33%) Exposed Structure Granulation Quality: Red Fascia Exposed: No Necrotic Amount: Large (67-100%) Fat Layer (Subcutaneous Tissue) Exposed: Yes Necrotic Quality: Eschar, Adherent Slough Tendon Exposed: No Muscle Exposed: No Joint Exposed: No Bone Exposed: No Periwound Skin Texture Texture Color No Abnormalities Noted: No No Abnormalities Noted: Yes Callus: Yes Moisture No Abnormalities Noted: No Dry / Scaly: Yes Maceration: No Treatment Notes Wound #1 (Calcaneus) Wound Laterality: Left Cleanser Soap and Water Discharge Instruction: May shower and wash wound with dial antibacterial soap and water prior to dressing change. Wound Cleanser Discharge Instruction: Cleanse the wound with wound cleanser prior to applying a clean dressing using gauze sponges, not tissue or cotton balls. Peri-Wound Care Topical Primary Dressing Hydrofera Blue Ready Foam, 2.5 x2.5 in Discharge Instruction: Apply to wound bed as instructed Secondary Dressing ALLEVYN Heel 4 1/2in x 5  1/2in / 10.5cm x 13.5cm Discharge Instruction: Apply over primary dressing as directed. Woven Gauze Sponge, Non-Sterile 4x4 in Discharge Instruction: Apply over primary dressing as directed. Secured With Elastic Bandage 4 inch (ACE bandage) Discharge Instruction: Secure with ACE bandage as directed. Kerlix Roll Sterile, 4.5x3.1 (in/yd) Discharge Instruction: Secure with Kerlix as directed. 34M Medipore Soft Cloth Surgical T 2x10 (in/yd) ape Discharge Instruction: Secure with tape as directed. Compression Wrap Compression Stockings Add-Ons Electronic Signature(s) Signed: 08/08/2022 11:04:44 AM By: Dellie Catholic RN Entered By: Dellie Catholic on 08/08/2022 08:35:37 Balicki, Destiny Destiny [name] ([id number]) [id number] Page 7 of 7 -------------------------------------------------------------------------------- Vitals Details Patient Name: Date of Service: Destiny Destiny [name], Destiny Destiny [name] 08/08/2022 8:30 A M Medical Record Number: [id number] Patient Account Number: [id number] Date of Birth/Sex: Treating RN: 10-20-1945 (76 y.o. Destiny Destiny [name] Primary Care Clytee Heinrich: Deland Pretty Other Clinician: Referring Malaina Mortellaro: Treating Matisse Salais/Extender: Raylene Everts in Treatment: 11 Vital Signs Time Taken: 08:26 Temperature (Destiny [name]): 97.8 Height (in): 63 Pulse (bpm): 73 Respiratory Rate (breaths/min): 16 Blood Pressure (mmHg): 139/82 Reference Range: 80 - 120 mg / dl Electronic Signature(s) Signed: 08/08/2022 11:04:44 AM By: Dellie Catholic RN Entered By: Dellie Catholic on 08/08/2022 08:27:45

## 2022-08-13 ENCOUNTER — Other Ambulatory Visit: Payer: Self-pay

## 2022-08-13 ENCOUNTER — Encounter (HOSPITAL_COMMUNITY)
Admission: RE | Disposition: A | Discharge status: Home / Self Care | Payer: Self-pay | Source: Home / Self Care | Attending: General Surgery

## 2022-08-13 ENCOUNTER — Inpatient Hospital Stay (HOSPITAL_COMMUNITY): Payer: PPO | Admitting: Certified Registered"

## 2022-08-13 ENCOUNTER — Encounter (HOSPITAL_COMMUNITY): Payer: Self-pay | Admitting: General Surgery

## 2022-08-13 ENCOUNTER — Inpatient Hospital Stay (HOSPITAL_COMMUNITY)
Admission: RE | Admit: 2022-08-13 | Discharge: 2022-08-16 | DRG: 331 | Disposition: A | Discharge status: Home / Self Care | Payer: PPO | Attending: General Surgery | Admitting: General Surgery

## 2022-08-13 DIAGNOSIS — Z885 Allergy status to narcotic agent status: Secondary | ICD-10-CM

## 2022-08-13 DIAGNOSIS — E119 Type 2 diabetes mellitus without complications: Secondary | ICD-10-CM | POA: Diagnosis present

## 2022-08-13 DIAGNOSIS — Z90710 Acquired absence of both cervix and uterus: Secondary | ICD-10-CM | POA: Diagnosis not present

## 2022-08-13 DIAGNOSIS — E079 Disorder of thyroid, unspecified: Secondary | ICD-10-CM | POA: Diagnosis not present

## 2022-08-13 DIAGNOSIS — Z85820 Personal history of malignant melanoma of skin: Secondary | ICD-10-CM

## 2022-08-13 DIAGNOSIS — Z79890 Hormone replacement therapy (postmenopausal): Secondary | ICD-10-CM | POA: Diagnosis not present

## 2022-08-13 DIAGNOSIS — Z87891 Personal history of nicotine dependence: Secondary | ICD-10-CM

## 2022-08-13 DIAGNOSIS — E039 Hypothyroidism, unspecified: Secondary | ICD-10-CM

## 2022-08-13 DIAGNOSIS — Z9221 Personal history of antineoplastic chemotherapy: Secondary | ICD-10-CM

## 2022-08-13 DIAGNOSIS — Z8249 Family history of ischemic heart disease and other diseases of the circulatory system: Secondary | ICD-10-CM | POA: Diagnosis not present

## 2022-08-13 DIAGNOSIS — Z7984 Long term (current) use of oral hypoglycemic drugs: Secondary | ICD-10-CM

## 2022-08-13 DIAGNOSIS — Z7985 Long-term (current) use of injectable non-insulin antidiabetic drugs: Secondary | ICD-10-CM

## 2022-08-13 DIAGNOSIS — Z79899 Other long term (current) drug therapy: Secondary | ICD-10-CM | POA: Diagnosis not present

## 2022-08-13 DIAGNOSIS — E1149 Type 2 diabetes mellitus with other diabetic neurological complication: Secondary | ICD-10-CM | POA: Diagnosis not present

## 2022-08-13 DIAGNOSIS — G4730 Sleep apnea, unspecified: Secondary | ICD-10-CM | POA: Diagnosis not present

## 2022-08-13 DIAGNOSIS — Z923 Personal history of irradiation: Secondary | ICD-10-CM

## 2022-08-13 DIAGNOSIS — C20 Malignant neoplasm of rectum: Secondary | ICD-10-CM

## 2022-08-13 DIAGNOSIS — K5730 Diverticulosis of large intestine without perforation or abscess without bleeding: Secondary | ICD-10-CM | POA: Diagnosis not present

## 2022-08-13 DIAGNOSIS — Z833 Family history of diabetes mellitus: Secondary | ICD-10-CM

## 2022-08-13 DIAGNOSIS — I10 Essential (primary) hypertension: Secondary | ICD-10-CM | POA: Diagnosis not present

## 2022-08-13 DIAGNOSIS — Z880 Allergy status to penicillin: Secondary | ICD-10-CM

## 2022-08-13 DIAGNOSIS — Z886 Allergy status to analgesic agent status: Secondary | ICD-10-CM | POA: Diagnosis not present

## 2022-08-13 HISTORY — PX: FLEXIBLE SIGMOIDOSCOPY: SHX5431

## 2022-08-13 HISTORY — PX: XI ROBOTIC ASSISTED LOWER ANTERIOR RESECTION: SHX6558

## 2022-08-13 LAB — GLUCOSE, CAPILLARY
Glucose-Capillary: 89 mg/dL (ref 70–99)
Glucose-Capillary: 144 mg/dL — ABNORMAL HIGH (ref 70–99)
Glucose-Capillary: 136 mg/dL — ABNORMAL HIGH (ref 70–99)

## 2022-08-13 LAB — ABO/RH: ABO/RH(D): O POS

## 2022-08-13 SURGERY — RESECTION, RECTUM, LOW ANTERIOR, ROBOT-ASSISTED
Anesthesia: General | Site: Rectum

## 2022-08-13 MED ORDER — ONDANSETRON HCL 4 MG/2ML IJ SOLN
INTRAMUSCULAR | Status: DC | PRN
  Administered 2022-08-13: 4 mg via INTRAVENOUS

## 2022-08-13 MED ORDER — PROPOFOL 10 MG/ML IV BOLUS
INTRAVENOUS | Status: DC | PRN
  Administered 2022-08-13: 120 mg via INTRAVENOUS

## 2022-08-13 MED ORDER — ONDANSETRON HCL 4 MG PO TABS: 4.0000 mg | ORAL_TABLET | Freq: Four times a day (QID) | ORAL | Status: DC | PRN

## 2022-08-13 MED ORDER — PROPOFOL 10 MG/ML IV BOLUS
INTRAVENOUS | Status: AC
  Filled 2022-08-13: qty 20

## 2022-08-13 MED ORDER — ONDANSETRON HCL 4 MG/2ML IJ SOLN
INTRAMUSCULAR | Status: AC
  Filled 2022-08-13: qty 2

## 2022-08-13 MED ORDER — LACTATED RINGERS IR SOLN
Status: DC | PRN
  Administered 2022-08-13: 1000 mL

## 2022-08-13 MED ORDER — HYDROMORPHONE HCL 1 MG/ML IJ SOLN: 0.5000 mg | INTRAMUSCULAR | Status: DC | PRN

## 2022-08-13 MED ORDER — DEXAMETHASONE SODIUM PHOSPHATE 10 MG/ML IJ SOLN
INTRAMUSCULAR | Status: DC | PRN
  Administered 2022-08-13: 4 mg via INTRAVENOUS

## 2022-08-13 MED ORDER — 0.9 % SODIUM CHLORIDE (POUR BTL) OPTIME
TOPICAL | Status: DC | PRN
  Administered 2022-08-13: 2000 mL

## 2022-08-13 MED ORDER — BUPIVACAINE LIPOSOME 1.3 % IJ SUSP: 20.0000 mL | Freq: Once | INTRAMUSCULAR | Status: DC

## 2022-08-13 MED ORDER — LEVOTHYROXINE SODIUM 100 MCG PO TABS
100.0000 ug | ORAL_TABLET | Freq: Every day | ORAL | Status: DC
  Administered 2022-08-14 – 2022-08-16 (×3): 100 ug via ORAL
  Filled 2022-08-13 (×3): qty 1

## 2022-08-13 MED ORDER — ACETAMINOPHEN 500 MG PO TABS
1000.0000 mg | ORAL_TABLET | ORAL | Status: AC
  Administered 2022-08-13: 1000 mg via ORAL

## 2022-08-13 MED ORDER — PROPOFOL 500 MG/50ML IV EMUL
INTRAVENOUS | Status: DC | PRN
  Administered 2022-08-13: 150 ug/kg/min via INTRAVENOUS

## 2022-08-13 MED ORDER — GABAPENTIN 300 MG PO CAPS
300.0000 mg | ORAL_CAPSULE | ORAL | Status: AC
  Administered 2022-08-13: 300 mg via ORAL
  Filled 2022-08-13: qty 1

## 2022-08-13 MED ORDER — BISACODYL 5 MG PO TBEC: 20.0000 mg | DELAYED_RELEASE_TABLET | Freq: Once | ORAL | Status: DC

## 2022-08-13 MED ORDER — ONDANSETRON HCL 4 MG/2ML IJ SOLN: 4.0000 mg | Freq: Four times a day (QID) | INTRAMUSCULAR | Status: DC | PRN

## 2022-08-13 MED ORDER — FENTANYL CITRATE (PF) 100 MCG/2ML IJ SOLN
INTRAMUSCULAR | Status: AC
  Filled 2022-08-13: qty 2

## 2022-08-13 MED ORDER — FENTANYL CITRATE PF 50 MCG/ML IJ SOSY
PREFILLED_SYRINGE | INTRAMUSCULAR | Status: AC
  Filled 2022-08-13: qty 1

## 2022-08-13 MED ORDER — LIDOCAINE HCL (PF) 2 % IJ SOLN
INTRAMUSCULAR | Status: AC
  Filled 2022-08-13: qty 5

## 2022-08-13 MED ORDER — HEPARIN SODIUM (PORCINE) 5000 UNIT/ML IJ SOLN
5000.0000 [IU] | Freq: Once | INTRAMUSCULAR | Status: AC
  Administered 2022-08-13: 5000 [IU] via SUBCUTANEOUS
  Filled 2022-08-13: qty 1

## 2022-08-13 MED ORDER — OXYCODONE HCL 5 MG PO TABS: 5.0000 mg | ORAL_TABLET | Freq: Once | ORAL | Status: DC | PRN

## 2022-08-13 MED ORDER — PRAVASTATIN SODIUM 40 MG PO TABS
40.0000 mg | ORAL_TABLET | Freq: Every evening | ORAL | Status: DC
  Administered 2022-08-13 – 2022-08-15 (×3): 40 mg via ORAL
  Filled 2022-08-13 (×3): qty 1

## 2022-08-13 MED ORDER — EPHEDRINE 5 MG/ML INJ
INTRAVENOUS | Status: AC
  Filled 2022-08-13: qty 5

## 2022-08-13 MED ORDER — KETAMINE HCL 10 MG/ML IJ SOLN
INTRAMUSCULAR | Status: AC
  Filled 2022-08-13: qty 1

## 2022-08-13 MED ORDER — KCL IN DEXTROSE-NACL 20-5-0.45 MEQ/L-%-% IV SOLN
INTRAVENOUS | Status: DC
  Filled 2022-08-13: qty 1000

## 2022-08-13 MED ORDER — ALBUMIN HUMAN 5 % IV SOLN: INTRAVENOUS | Status: DC | PRN

## 2022-08-13 MED ORDER — PHENYLEPHRINE HCL (PRESSORS) 10 MG/ML IV SOLN
INTRAVENOUS | Status: AC
  Filled 2022-08-13: qty 1

## 2022-08-13 MED ORDER — FENTANYL CITRATE (PF) 100 MCG/2ML IJ SOLN
INTRAMUSCULAR | Status: DC | PRN
  Administered 2022-08-13 (×4): 25 ug via INTRAVENOUS

## 2022-08-13 MED ORDER — ROCURONIUM BROMIDE 10 MG/ML (PF) SYRINGE
PREFILLED_SYRINGE | INTRAVENOUS | Status: AC
  Filled 2022-08-13: qty 10

## 2022-08-13 MED ORDER — AMISULPRIDE (ANTIEMETIC) 5 MG/2ML IV SOLN
10.0000 mg | Freq: Once | INTRAVENOUS | Status: AC
  Administered 2022-08-13: 10 mg via INTRAVENOUS

## 2022-08-13 MED ORDER — LACTATED RINGERS IV SOLN: INTRAVENOUS | Status: DC | PRN

## 2022-08-13 MED ORDER — ENSURE PRE-SURGERY PO LIQD
592.0000 mL | Freq: Once | ORAL | Status: DC
  Filled 2022-08-13: qty 592

## 2022-08-13 MED ORDER — ALVIMOPAN 12 MG PO CAPS
12.0000 mg | ORAL_CAPSULE | Freq: Two times a day (BID) | ORAL | Status: DC
  Administered 2022-08-14: 12 mg via ORAL
  Filled 2022-08-13: qty 1

## 2022-08-13 MED ORDER — PROPOFOL 1000 MG/100ML IV EMUL
INTRAVENOUS | Status: AC
  Filled 2022-08-13: qty 200

## 2022-08-13 MED ORDER — SODIUM CHLORIDE (PF) 0.9 % IJ SOLN
INTRAMUSCULAR | Status: AC
  Filled 2022-08-13: qty 20

## 2022-08-13 MED ORDER — ENOXAPARIN SODIUM 40 MG/0.4ML IJ SOSY
40.0000 mg | PREFILLED_SYRINGE | INTRAMUSCULAR | Status: DC
  Administered 2022-08-14 – 2022-08-16 (×3): 40 mg via SUBCUTANEOUS
  Filled 2022-08-13 (×3): qty 0.4

## 2022-08-13 MED ORDER — DIPHENHYDRAMINE HCL 50 MG/ML IJ SOLN: 12.5000 mg | Freq: Four times a day (QID) | INTRAMUSCULAR | Status: DC | PRN

## 2022-08-13 MED ORDER — SUGAMMADEX SODIUM 200 MG/2ML IV SOLN
INTRAVENOUS | Status: DC | PRN
  Administered 2022-08-13: 339.2 mg via INTRAVENOUS

## 2022-08-13 MED ORDER — ENSURE PRE-SURGERY PO LIQD
296.0000 mL | Freq: Once | ORAL | Status: DC
  Filled 2022-08-13: qty 296

## 2022-08-13 MED ORDER — SODIUM CHLORIDE 0.9 % IV SOLN
2.0000 g | INTRAVENOUS | Status: AC
  Administered 2022-08-13: 2 g via INTRAVENOUS
  Filled 2022-08-13: qty 2

## 2022-08-13 MED ORDER — BUPIVACAINE LIPOSOME 1.3 % IJ SUSP
INTRAMUSCULAR | Status: AC
  Filled 2022-08-13: qty 20

## 2022-08-13 MED ORDER — INSULIN ASPART 100 UNIT/ML IJ SOLN: 0.0000 [IU] | Freq: Three times a day (TID) | INTRAMUSCULAR | Status: DC

## 2022-08-13 MED ORDER — PHENYLEPHRINE 80 MCG/ML (10ML) SYRINGE FOR IV PUSH (FOR BLOOD PRESSURE SUPPORT)
PREFILLED_SYRINGE | INTRAVENOUS | Status: DC | PRN
  Administered 2022-08-13: 120 ug via INTRAVENOUS
  Administered 2022-08-13 (×2): 80 ug via INTRAVENOUS

## 2022-08-13 MED ORDER — PROPOFOL 1000 MG/100ML IV EMUL
INTRAVENOUS | Status: AC
  Filled 2022-08-13: qty 100

## 2022-08-13 MED ORDER — ALUM & MAG HYDROXIDE-SIMETH 200-200-20 MG/5ML PO SUSP: 30.0000 mL | Freq: Four times a day (QID) | ORAL | Status: DC | PRN

## 2022-08-13 MED ORDER — BUPIVACAINE-EPINEPHRINE (PF) 0.5% -1:200000 IJ SOLN
INTRAMUSCULAR | Status: AC
  Filled 2022-08-13: qty 30

## 2022-08-13 MED ORDER — OXYCODONE HCL 5 MG/5ML PO SOLN: 5.0000 mg | Freq: Once | ORAL | Status: DC | PRN

## 2022-08-13 MED ORDER — AMISULPRIDE (ANTIEMETIC) 5 MG/2ML IV SOLN
INTRAVENOUS | Status: AC
  Filled 2022-08-13: qty 2

## 2022-08-13 MED ORDER — SACCHAROMYCES BOULARDII 250 MG PO CAPS
250.0000 mg | ORAL_CAPSULE | Freq: Two times a day (BID) | ORAL | Status: DC
  Administered 2022-08-13 – 2022-08-16 (×6): 250 mg via ORAL
  Filled 2022-08-13 (×6): qty 1

## 2022-08-13 MED ORDER — ROCURONIUM BROMIDE 10 MG/ML (PF) SYRINGE
PREFILLED_SYRINGE | INTRAVENOUS | Status: DC | PRN
  Administered 2022-08-13 (×2): 20 mg via INTRAVENOUS
  Administered 2022-08-13: 60 mg via INTRAVENOUS

## 2022-08-13 MED ORDER — INSULIN ASPART 100 UNIT/ML IJ SOLN: 0.0000 [IU] | Freq: Every day | INTRAMUSCULAR | Status: DC

## 2022-08-13 MED ORDER — ORAL CARE MOUTH RINSE
15.0000 mL | Freq: Once | OROMUCOSAL | Status: AC
  Administered 2022-08-13: 15 mL via OROMUCOSAL

## 2022-08-13 MED ORDER — DEXAMETHASONE SODIUM PHOSPHATE 10 MG/ML IJ SOLN
INTRAMUSCULAR | Status: AC
  Filled 2022-08-13: qty 1

## 2022-08-13 MED ORDER — BUPIVACAINE LIPOSOME 1.3 % IJ SUSP
INTRAMUSCULAR | Status: DC | PRN
  Administered 2022-08-13: 20 mL

## 2022-08-13 MED ORDER — ACETAMINOPHEN 500 MG PO TABS
1000.0000 mg | ORAL_TABLET | Freq: Once | ORAL | Status: DC
  Filled 2022-08-13: qty 2

## 2022-08-13 MED ORDER — EPHEDRINE SULFATE-NACL 50-0.9 MG/10ML-% IV SOSY
PREFILLED_SYRINGE | INTRAVENOUS | Status: DC | PRN
  Administered 2022-08-13: 5 mg via INTRAVENOUS
  Administered 2022-08-13: 10 mg via INTRAVENOUS

## 2022-08-13 MED ORDER — DIPHENHYDRAMINE HCL 12.5 MG/5ML PO ELIX: 12.5000 mg | ORAL_SOLUTION | Freq: Four times a day (QID) | ORAL | Status: DC | PRN

## 2022-08-13 MED ORDER — LACTATED RINGERS IV SOLN: INTRAVENOUS | Status: DC

## 2022-08-13 MED ORDER — LIDOCAINE 2% (20 MG/ML) 5 ML SYRINGE
INTRAMUSCULAR | Status: DC | PRN
  Administered 2022-08-13: 60 mg via INTRAVENOUS

## 2022-08-13 MED ORDER — SODIUM CHLORIDE 0.9 % IV SOLN
2.0000 g | Freq: Two times a day (BID) | INTRAVENOUS | Status: AC
  Administered 2022-08-14: 2 g via INTRAVENOUS
  Filled 2022-08-13: qty 2

## 2022-08-13 MED ORDER — PHENYLEPHRINE 80 MCG/ML (10ML) SYRINGE FOR IV PUSH (FOR BLOOD PRESSURE SUPPORT)
PREFILLED_SYRINGE | INTRAVENOUS | Status: AC
  Filled 2022-08-13: qty 10

## 2022-08-13 MED ORDER — PHENYLEPHRINE HCL-NACL 20-0.9 MG/250ML-% IV SOLN
INTRAVENOUS | Status: DC | PRN
  Administered 2022-08-13: 40 ug/min via INTRAVENOUS

## 2022-08-13 MED ORDER — METFORMIN HCL ER 500 MG PO TB24
500.0000 mg | ORAL_TABLET | Freq: Two times a day (BID) | ORAL | Status: DC
  Administered 2022-08-15 – 2022-08-16 (×3): 500 mg via ORAL
  Filled 2022-08-13 (×4): qty 1

## 2022-08-13 MED ORDER — FENTANYL CITRATE PF 50 MCG/ML IJ SOSY
25.0000 ug | PREFILLED_SYRINGE | INTRAMUSCULAR | Status: DC | PRN
  Administered 2022-08-13: 50 ug via INTRAVENOUS

## 2022-08-13 MED ORDER — ENSURE SURGERY PO LIQD
237.0000 mL | Freq: Two times a day (BID) | ORAL | Status: DC
  Administered 2022-08-14 – 2022-08-16 (×5): 237 mL via ORAL
  Filled 2022-08-13 (×6): qty 237

## 2022-08-13 MED ORDER — KETAMINE HCL 10 MG/ML IJ SOLN
INTRAMUSCULAR | Status: DC | PRN
  Administered 2022-08-13: 10 mg via INTRAVENOUS
  Administered 2022-08-13: 15 mg via INTRAVENOUS

## 2022-08-13 MED ORDER — CHLORHEXIDINE GLUCONATE 0.12 % MT SOLN: 15.0000 mL | Freq: Once | OROMUCOSAL | Status: AC

## 2022-08-13 MED ORDER — ALVIMOPAN 12 MG PO CAPS
12.0000 mg | ORAL_CAPSULE | ORAL | Status: AC
  Administered 2022-08-13: 12 mg via ORAL
  Filled 2022-08-13: qty 1

## 2022-08-13 MED ORDER — GABAPENTIN 300 MG PO CAPS
300.0000 mg | ORAL_CAPSULE | Freq: Two times a day (BID) | ORAL | Status: DC
  Administered 2022-08-13 – 2022-08-16 (×6): 300 mg via ORAL
  Filled 2022-08-13 (×6): qty 1

## 2022-08-13 MED ORDER — ONDANSETRON HCL 4 MG/2ML IJ SOLN: 4.0000 mg | Freq: Once | INTRAMUSCULAR | Status: DC | PRN

## 2022-08-13 MED ORDER — BUPIVACAINE-EPINEPHRINE 0.5% -1:200000 IJ SOLN
INTRAMUSCULAR | Status: DC | PRN
  Administered 2022-08-13: 20 mL

## 2022-08-13 MED ORDER — POLYETHYLENE GLYCOL 3350 17 GM/SCOOP PO POWD
1.0000 | Freq: Once | ORAL | Status: DC
  Filled 2022-08-13: qty 255

## 2022-08-13 MED ORDER — INDOCYANINE GREEN 25 MG IV SOLR
INTRAVENOUS | Status: DC | PRN
  Administered 2022-08-13: 2 mg via INTRAVENOUS

## 2022-08-13 SURGICAL SUPPLY — 96 items
ADH SKN CLS APL DERMABOND .7 (GAUZE/BANDAGES/DRESSINGS) ×2
BAG COUNTER SPONGE SURGICOUNT (BAG) ×2 IMPLANT
BAG SPNG CNTER NS LX DISP (BAG) ×2
BLADE EXTENDED COATED 6.5IN (ELECTRODE) IMPLANT
CANNULA REDUC XI 12-8 STAPL (CANNULA)
CANNULA REDUCER 12-8 DVNC XI (CANNULA) IMPLANT
CELLS DAT CNTRL 66122 CELL SVR (MISCELLANEOUS) IMPLANT
COVER SURGICAL LIGHT HANDLE (MISCELLANEOUS) ×4 IMPLANT
COVER TIP SHEARS 8 DVNC (MISCELLANEOUS) ×3 IMPLANT
COVER TIP SHEARS 8MM DA VINCI (MISCELLANEOUS) ×2
DERMABOND ADVANCED .7 DNX12 (GAUZE/BANDAGES/DRESSINGS) IMPLANT
DRAIN CHANNEL 19F RND (DRAIN) IMPLANT
DRAPE ARM DVNC X/XI (DISPOSABLE) ×8 IMPLANT
DRAPE COLUMN DVNC XI (DISPOSABLE) ×3 IMPLANT
DRAPE DA VINCI XI ARM (DISPOSABLE) ×8
DRAPE DA VINCI XI COLUMN (DISPOSABLE) ×2
DRAPE SURG IRRIG POUCH 19X23 (DRAPES) ×2 IMPLANT
DRSG OPSITE POSTOP 4X10 (GAUZE/BANDAGES/DRESSINGS) IMPLANT
DRSG OPSITE POSTOP 4X6 (GAUZE/BANDAGES/DRESSINGS) IMPLANT
DRSG OPSITE POSTOP 4X8 (GAUZE/BANDAGES/DRESSINGS) IMPLANT
ELECT PENCIL ROCKER SW 15FT (MISCELLANEOUS) ×2 IMPLANT
ELECT REM PT RETURN 15FT ADLT (MISCELLANEOUS) ×3 IMPLANT
ENDOLOOP SUT PDS II  0 18 (SUTURE)
ENDOLOOP SUT PDS II 0 18 (SUTURE) IMPLANT
EVACUATOR SILICONE 100CC (DRAIN) IMPLANT
GLOVE BIO SURGEON STRL SZ 6.5 (GLOVE) ×9 IMPLANT
GLOVE BIOGEL PI IND STRL 7.0 (GLOVE) ×4 IMPLANT
GLOVE INDICATOR 6.5 STRL GRN (GLOVE) ×3 IMPLANT
GOWN SRG XL LVL 4 BRTHBL STRL (GOWNS) ×2 IMPLANT
GOWN STRL NON-REIN XL LVL4 (GOWNS) ×2
GOWN STRL REUS W/ TWL XL LVL3 (GOWN DISPOSABLE) ×6 IMPLANT
GOWN STRL REUS W/TWL XL LVL3 (GOWN DISPOSABLE) ×6
GRASPER SUT TROCAR 14GX15 (MISCELLANEOUS) IMPLANT
HOLDER FOLEY CATH W/STRAP (MISCELLANEOUS) ×2 IMPLANT
IRRIG SUCT STRYKERFLOW 2 WTIP (MISCELLANEOUS) ×2
IRRIGATION SUCT STRKRFLW 2 WTP (MISCELLANEOUS) ×2 IMPLANT
KIT PROCEDURE DA VINCI SI (MISCELLANEOUS)
KIT PROCEDURE DVNC SI (MISCELLANEOUS) IMPLANT
KIT TURNOVER KIT A (KITS) IMPLANT
NDL INSUFFLATION 14GA 120MM (NEEDLE) ×3 IMPLANT
NEEDLE INSUFFLATION 14GA 120MM (NEEDLE) ×2 IMPLANT
PACK CARDIOVASCULAR III (CUSTOM PROCEDURE TRAY) ×2 IMPLANT
PACK COLON (CUSTOM PROCEDURE TRAY) ×2 IMPLANT
PAD POSITIONING PINK XL (MISCELLANEOUS) ×3 IMPLANT
RELOAD STAPLE 60 3.5 BLU DVNC (STAPLE) IMPLANT
RELOAD STAPLE 60 4.3 GRN DVNC (STAPLE) IMPLANT
RELOAD STAPLER 3.5X60 BLU DVNC (STAPLE) IMPLANT
RELOAD STAPLER 4.3X60 GRN DVNC (STAPLE) ×4 IMPLANT
RETRACTOR WND ALEXIS 18 MED (MISCELLANEOUS) IMPLANT
RTRCTR WOUND ALEXIS 18CM MED (MISCELLANEOUS)
SCISSORS LAP 5X35 DISP (ENDOMECHANICALS) IMPLANT
SEAL CANN UNIV 5-8 DVNC XI (MISCELLANEOUS) ×9 IMPLANT
SEAL XI 5MM-8MM UNIVERSAL (MISCELLANEOUS) ×8
SEALER VESSEL DA VINCI XI (MISCELLANEOUS) ×2
SEALER VESSEL EXT DVNC XI (MISCELLANEOUS) ×2 IMPLANT
SOLUTION ELECTROLUBE (MISCELLANEOUS) ×2 IMPLANT
SPIKE FLUID TRANSFER (MISCELLANEOUS) IMPLANT
STAPLER 60 DA VINCI SURE FORM (STAPLE) ×2
STAPLER 60 SUREFORM DVNC (STAPLE) IMPLANT
STAPLER CANNULA SEAL DVNC XI (STAPLE) IMPLANT
STAPLER CANNULA SEAL XI (STAPLE)
STAPLER ECHELON POWER CIR 29 (STAPLE) IMPLANT
STAPLER ECHELON POWER CIR 31 (STAPLE) IMPLANT
STAPLER RELOAD 3.5X60 BLU DVNC (STAPLE)
STAPLER RELOAD 3.5X60 BLUE (STAPLE)
STAPLER RELOAD 4.3X60 GREEN (STAPLE) ×4
STAPLER RELOAD 4.3X60 GRN DVNC (STAPLE) ×4
STOPCOCK 4 WAY LG BORE MALE ST (IV SETS) ×6 IMPLANT
SUT ETHILON 2 0 PS N (SUTURE) IMPLANT
SUT NOVA NAB DX-16 0-1 5-0 T12 (SUTURE) ×6 IMPLANT
SUT PROLENE 2 0 KS (SUTURE) IMPLANT
SUT SILK 2 0 (SUTURE) ×2
SUT SILK 2 0 SH CR/8 (SUTURE) IMPLANT
SUT SILK 2-0 18XBRD TIE 12 (SUTURE) ×2 IMPLANT
SUT SILK 3 0 (SUTURE)
SUT SILK 3 0 SH CR/8 (SUTURE) ×2 IMPLANT
SUT SILK 3-0 18XBRD TIE 12 (SUTURE) IMPLANT
SUT V-LOC BARB 180 2/0GR6 GS22 (SUTURE)
SUT VIC AB 2-0 SH 18 (SUTURE) IMPLANT
SUT VIC AB 2-0 SH 27 (SUTURE)
SUT VIC AB 2-0 SH 27X BRD (SUTURE) IMPLANT
SUT VIC AB 3-0 SH 18 (SUTURE) IMPLANT
SUT VIC AB 4-0 PS2 27 (SUTURE) ×4 IMPLANT
SUT VICRYL 0 UR6 27IN ABS (SUTURE) ×3 IMPLANT
SUTURE V-LC BRB 180 2/0GR6GS22 (SUTURE) IMPLANT
SYR 10ML ECCENTRIC (SYRINGE) ×2 IMPLANT
SYS LAPSCP GELPORT 120MM (MISCELLANEOUS)
SYS WOUND ALEXIS 18CM MED (MISCELLANEOUS)
SYSTEM LAPSCP GELPORT 120MM (MISCELLANEOUS) IMPLANT
SYSTEM WOUND ALEXIS 18CM MED (MISCELLANEOUS) IMPLANT
TOWEL OR 17X26 10 PK STRL BLUE (TOWEL DISPOSABLE) IMPLANT
TOWEL OR NON WOVEN STRL DISP B (DISPOSABLE) ×2 IMPLANT
TRAY FOLEY MTR SLVR 16FR STAT (SET/KITS/TRAYS/PACK) ×3 IMPLANT
TROCAR ADV FIXATION 5X100MM (TROCAR) ×2 IMPLANT
TUBING CONNECTING 10 (TUBING) ×4 IMPLANT
TUBING INSUFFLATION 10FT LAP (TUBING) ×2 IMPLANT

## 2022-08-13 NOTE — Transfer of Care (Signed)
Immediate Anesthesia Transfer of Care Note  Patient: Destiny [name]  Procedure(s) Performed: XI ROBOTIC ASSISTED LOWER ANTERIOR RESECTION, WITH INTRAOPERATIVE ASSESSMENT OF PERFUSION USING FIREFLY FLEXIBLE SIGMOIDOSCOPY (Rectum)  Patient Location: PACU  Anesthesia Type:General  Level of Consciousness: drowsy and patient cooperative  Airway & Oxygen Therapy: Patient Spontanous Breathing and Patient connected to face mask oxygen  Post-op Assessment: Report given to RN and Post -op Vital signs reviewed and stable  Post vital signs: Reviewed and stable  Last Vitals:  Vitals Value Taken Time  BP 109/80 08/13/22 1619  Temp    Pulse 123 08/13/22 1626  Resp 20 08/13/22 1626  SpO2 96 % 08/13/22 1626  Vitals shown include unvalidated device data.  Last Pain:  Vitals:   08/13/22 1132  PainSc: 0-No pain         Complications: No notable events documented.

## 2022-08-13 NOTE — Anesthesia Postprocedure Evaluation (Signed)
Anesthesia Post Note  Patient: [name]  Procedure(s) Performed: XI ROBOTIC ASSISTED LOWER ANTERIOR RESECTION, WITH INTRAOPERATIVE ASSESSMENT OF PERFUSION USING FIREFLY FLEXIBLE SIGMOIDOSCOPY (Rectum)     Patient location during evaluation: PACU Anesthesia Type: General Level of consciousness: awake and alert Pain management: pain level controlled Vital Signs Assessment: post-procedure vital signs reviewed and stable Respiratory status: spontaneous breathing, nonlabored ventilation, respiratory function stable and patient connected to nasal cannula oxygen Cardiovascular status: blood pressure returned to baseline and stable Postop Assessment: no apparent nausea or vomiting Anesthetic complications: no   No notable events documented.  Last Vitals:  Vitals:   08/13/22 1715 08/13/22 1730  BP: 99/71 101/66  Pulse: 82 83  Resp: 17 18  Temp:    SpO2: 95% 94%    Last Pain:  Vitals:   08/13/22 1132  PainSc: 0-No pain                 Destiny [name]

## 2022-08-13 NOTE — Anesthesia Preprocedure Evaluation (Addendum)
Anesthesia Evaluation  Patient identified by MRN, date of birth, ID band Patient awake    Reviewed: Allergy & Precautions, NPO status , Patient's Chart, lab work & pertinent test results  History of Anesthesia Complications (+) PONV and history of anesthetic complications  Airway Mallampati: I  TM Distance: >3 FB Neck ROM: Full    Dental  (+) Dental Advisory Given, Partial Lower, Upper Dentures   Pulmonary sleep apnea , former smoker,    Pulmonary exam normal        Cardiovascular hypertension, Normal cardiovascular exam     Neuro/Psych  Neuromuscular disease negative psych ROS   GI/Hepatic Neg liver ROS,  Rectal cancer S/p lap band    Endo/Other  diabetes, Type 2, Oral Hypoglycemic AgentsHypothyroidism  Obesity   Renal/GU  Kidney stones      Musculoskeletal  (+) Arthritis ,   Abdominal   Peds  Hematology negative hematology ROS (+)   Anesthesia Other Findings   Reproductive/Obstetrics                            Anesthesia Physical Anesthesia Plan  ASA: 3  Anesthesia Plan: General   Post-op Pain Management: Tylenol PO (pre-op)*   Induction: Intravenous  PONV Risk Score and Plan: 4 or greater and Treatment may vary due to age or medical condition, Ondansetron and TIVA  Airway Management Planned: Oral ETT  Additional Equipment: None  Intra-op Plan:   Post-operative Plan: Extubation in OR  Informed Consent: I have reviewed the patients History and Physical, chart, labs and discussed the procedure including the risks, benefits and alternatives for the proposed anesthesia with the patient or authorized representative who has indicated his/her understanding and acceptance.     Dental advisory given  Plan Discussed with: CRNA and Anesthesiologist  Anesthesia Plan Comments:        Anesthesia Quick Evaluation

## 2022-08-13 NOTE — Anesthesia Procedure Notes (Signed)
Procedure Name: Intubation Date/Time: 08/13/2022 1:09 PM  Performed by: Eben Burow, CRNAPre-anesthesia Checklist: Patient identified, Emergency Drugs available, Suction available, Patient being monitored and Timeout performed Patient Re-evaluated:Patient Re-evaluated prior to induction Oxygen Delivery Method: Circle system utilized Preoxygenation: Pre-oxygenation with 100% oxygen Induction Type: IV induction Ventilation: Mask ventilation without difficulty Laryngoscope Size: Mac and 4 Grade View: Grade I Tube type: Oral Tube size: 7.0 mm Number of attempts: 1 Airway Equipment and Method: Stylet Placement Confirmation: ETT inserted through vocal cords under direct vision, positive ETCO2 and breath sounds checked- equal and bilateral Secured at: 20 cm Tube secured with: Tape Dental Injury: Teeth and Oropharynx as per pre-operative assessment

## 2022-08-13 NOTE — Progress Notes (Signed)
Pt arrived to unit on stretch with PACU nurse. She was alert & oriented x 4. VSS, TELE box placed & confirmed with another nurse. She was positioned to comfort & LR resumed @ 10 ml/hr. Pt denied need for pain medication. Documenting RN reviewed need to keep control of pain & the importance of pain management in order to heal. Pt also expressed concern about the sliding scale for insulin, due to a past traumatic experience. Relayed this information to oncoming shift nurses during bedside report with pt participating. Pt understands need for communication with each nurse, & will attempt to participate in care. Son a bedside. Pt was in stable condition ending report.

## 2022-08-13 NOTE — Op Note (Signed)
08/13/2022  3:57 PM  PATIENT:  Destiny [name]  76 y.o. female  Patient Care Team: Deland Pretty, MD as PCP - General (Internal Medicine) Janina Mayo, MD as PCP - Cardiology (Cardiology) Algie Coffer, Mindi Slicker, RN as Oncology Nurse Navigator  PRE-OPERATIVE DIAGNOSIS:  Rectal cancer  POST-OPERATIVE DIAGNOSIS:  Rectal cancer  PROCEDURE:  XI ROBOTIC ASSISTED LOWER ANTERIOR RESECTION, INTRAOPERATIVE ASSESSMENT OF PERFUSION USING FIREFLY FLEXIBLE SIGMOIDOSCOPY   Surgeon(s): Leighton Ruff, MD Ileana Roup, MD  ASSISTANT: Dr Dema Severin   ANESTHESIA:   local and general  EBL: 148m  Total I/O In: 1350 [I.V.:1000; IV Piggyback:350] Out: 100 [Blood:100]  Delay start of Pharmacological VTE agent (>24hrs) due to surgical blood loss or risk of bleeding:  no  DRAINS: (19F) Jackson-Pratt drain(s) with closed bulb suction in the pelvis    SPECIMEN:  Source of Specimen:  Distal sigmoid and proximal rectum, final distal margin  DISPOSITION OF SPECIMEN:  PATHOLOGY  COUNTS:  YES  PLAN OF CARE: Admit to inpatient   PATIENT DISPOSITION:  PACU - hemodynamically stable.  INDICATION:    76y.o. F with proximal rectal cancer s/p partial neoadjuvant chemotherapy and radiation.  I recommended segmental resection:  The anatomy & physiology of the digestive tract was discussed.  The pathophysiology was discussed.  Natural history risks without surgery was discussed.   I worked to give an overview of the disease and the frequent need to have multispecialty involvement.  I feel the risks of no intervention will lead to serious problems that outweigh the operative risks; therefore, I recommended a partial colectomy to remove the pathology.  Laparoscopic & open techniques were discussed.   Risks such as bleeding, infection, abscess, leak, reoperation, possible ostomy, hernia, heart attack, death, and other risks were discussed.  I noted a good likelihood this will help address the problem.   Goals  of post-operative recovery were discussed as well.    The patient expressed understanding & wished to proceed with surgery.  OR FINDINGS:   Patient had tattoo noted in the mid rectum  No obvious metastatic disease on visceral parietal peritoneum or liver.  The anastomosis rests ~9 cm from the anal verge by rigid proctoscopy.  DESCRIPTION:   Informed consent was confirmed.  The patient underwent general anaesthesia without difficulty.  The patient was positioned appropriately.  VTE prevention in place.  The patient's abdomen was clipped, prepped, & draped in a sterile fashion.  Surgical timeout confirmed our plan.  The patient was positioned in reverse Trendelenburg.  Abdominal entry was gained using a Varies needle in the LUQ.  Entry was clean.  I induced carbon dioxide insufflation.  An 857mrobotic port was placed in the RUQ.  The patient's lap band reservoir was identified and preserved.  Camera inspection revealed no injury.  Extra ports were carefully placed under direct laparoscopic visualization.  I laparoscopically reflected the greater omentum and the upper abdomen the small bowel in the upper abdomen. The patient was appropriately positioned and the robot was docked to the patient's left side.  Instruments were placed under direct visualization. The omentum was taken down from the anterior abdominal wall with the vessel sealer.   I mobilized the sigmoid colon off of the pelvic sidewall.  I scored the base of peritoneum of the right side of the mesentery of the left colon from the ligament of Treitz to the peritoneal reflection of the mid rectum.  The patient had tattoo located in the mid rectum.  A clear  rectal mass couldn't be identified.  I elevated the sigmoid mesentery and enetered into the retro-mesenteric plane. We were able to identify the left ureter and gonadal vessels. We kept those posterior within the retroperitoneum and elevated the left colon mesentery off that. I did  isolated IMA pedicle but did not ligate it yet.  I continued distally and got into the avascular plane posterior to the mesorectum. This allowed me to help mobilize the rectum as well by freeing the mesorectum off the sacrum.  I mobilized the peritoneal coverings towards the peritoneal reflection on both the right and left sides of the rectum.  I could see the right and left ureters and stayed away from them. I dissected down both sides of the mesorectum and opened the peritoneal reflection using the vessel sealer.  I identified the distal tattoo at the peritoneal reflection.   I skeletonized the inferior mesenteric artery pedicle.  I went down to its takeoff from the aorta.   I isolated the inferior mesenteric vein off of the ligament of Treitz just cephalad to that as well.  After confirming the left ureter was out of the way, I went ahead and ligated the inferior mesenteric artery pedicle with bipolar robotic vessel sealer ~2cm above its takeoff from the aorta.  I did ligate the inferior mesenteric vein in a similar fashion.  We ensured hemostasis. I skeletonized the mesorectum at the junction at the proximal rectum using blunt dissection & bipolar robotic vessel sealer.  I mobilized the left colon in a lateral to medial fashion off the line of Toldt up towards the splenic flexure to ensure good mobilization of the left colon to reach into the pelvis.  The mesentery was divided at the level of the distal tattoo using the robotic vessel sealer.  A flexible sigmoidoscopy was performed.  No obvious mass or lesion could be identified but there was an area of scarring that could possibly have been the ulceration.  The tattoo was distal to this.  We decided to take the colon at the tattoo site.  This was done using a green load robotic 60 mm stapler x2.  I ligated the mesentery up to the proximal sigmoid using the robotic vessel sealer.  We then injected approximately 2 mg of firefly intravenously to check for  vascular perfusion.  There was good perfusion of the colon as well as the distal rectum.  At this point the robot was undocked.  The 12 mm Pfannenstiel incision was enlarged and an [place] wound protector was placed.  The colon was brought out through the wound protector and divided proximally over a pursestring applier and 2-0 Prolene pursestring suture.  The specimen was then sent to pathology for further examination.  A 29 mm EEA anvil was placed into the remaining colon and the Prolene suture was tied tightly around this.  This was then placed back into the abdomen and under laparoscopic visualization we were able to create an end-to-end anastomosis between the colon and the rectum.  There was no tension noted on the anastomosis.  There was no leak when tested with insufflation under irrigation.  There were no masses left in the distal rectum upon evaluation with flexible sigmoidoscopy.  The anastomosis rests approximately 9 cm from the sphincter complex.  Given that the patient had good perfusion of the distal rectum and colon and the anastomosis was greater than 6 cm from the anal verge it was decided to not do a diverting ileostomy.  A 19 [place]  Blake drain was placed into the pelvis and brought out through the 5 mm port site.  This was secured with a 2-0 nylon suture.  We then switched to clean gowns, gloves, instruments and drapes.  The peritoneum of the Pfannenstiel incision was then closed using a running 0 Vicryl suture.  The fascia was closed using 2 running #1 Novafil sutures.  The subcutaneous tissue was reapproximated using a 2-0 Vicryl suture.  The skin was closed using a running 4-0 Vicryl subcuticular suture.  A sterile dressing was applied.  The remaining port sites were closed using interrupted 2-0 Vicryl sutures and Dermabond.  The patient was then awakened from anesthesia and sent to the postanesthesia care unit in stable condition.  All counts were correct per operating room staff.  An MD  assistant was necessary for tissue manipulation, retraction and positioning due to the complexity of the case and hospital policies.  Rosario Adie, MD  Colorectal and [facility]

## 2022-08-13 NOTE — H&P (Signed)
PROVIDER:  Monico Blitz, MD   MRN: [id number] DOB: Aug 11, 1946   Subjective      History of Present Illness:   76 year old female who was noted to have a positive Cologuard test.  She underwent a colonoscopy and was noted to have a proximal ulcerated rectal mass approximately 12 cm from the anal verge.  Biopsies showed adenocarcinoma.  CT scan of the chest abdomen pelvis shows no obvious signs of metastatic disease.  Patient reports a history of constipation.  She denies any difficulty with incontinence but does have some urgency.  Surgical history significant for hysterectomy due to infection and lap band surgery.   Review of Systems: A complete review of systems was obtained from the patient.  I have reviewed this information and discussed as appropriate with the patient.  See HPI as well for other ROS.     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 (CMS-HCC)           Past Surgical History:  Procedure Laterality Date   Melanoma Left Heel Excision Left 03/26/2021           Allergies  Allergen Reactions   Penicillins Anaphylaxis and Swelling      Has patient had a PCN reaction causing immediate rash, facial/tongue/throat swelling, SOB or lightheadedness with hypotension: Yes Has patient had a PCN reaction causing severe rash involving mucus membranes or skin necrosis: unknown Has patient had a PCN reaction that required hospitalization: No Has patient had a PCN reaction occurring within the last 10 years: No If all of the above answers are "NO", then may proceed with Cephalosporin use.     Aspirin Other (See Comments)      Stomach upset   Atorvastatin Other (See Comments)      cramps     Fentanyl Other (See Comments) and Nausea   Lisinopril  Other (See Comments)      dizziness     Propofol Other (See Comments)   Rosuvastatin Other (See Comments)   Hydrocodone Other (See Comments)      "Does not like how it makes me feel            Current Outpatient Medications on File Prior to Visit  Medication Sig Dispense Refill   acetaminophen (TYLENOL) 500 MG tablet Take by mouth       aspirin 81 MG EC tablet Take by mouth (Patient not taking: Reported on 03/24/2022)       bisacodyL (DULCOLAX) 5 mg EC tablet Take 4 tablets (20 mg total) by mouth once daily as needed for Constipation for up to 1 dose 4 tablet 0   cholecalciferol (VITAMIN D3) 2,000 unit tablet Take by mouth       cyanocobalamin (VITAMIN B12) 1000 MCG tablet Take by mouth (Patient not taking: Reported on 03/24/2022)       denosumab (PROLIA) 60 mg/mL inj syringe 60 mg       ezetimibe (ZETIA) 10 mg tablet         levothyroxine (SYNTHROID) 100 MCG tablet TAKE 1 TABLET BY MOUTH EVERY MORNING ON EMPTY STOMACH       metFORMIN (GLUCOPHAGE-XR) 500 MG XR tablet 2  tablets       ondansetron (ZOFRAN) 4 MG tablet Take one tablet 30 minutes before take colonoscopy prep.       pravastatin (PRAVACHOL) 20 MG tablet Take 1 tablet by mouth every evening       semaglutide (OZEMPIC) 1 mg/dose (4 mg/3 mL) PnIj 2 mg       sodium, potassium, and magnesium (SUPREP) oral solution as directed       traMADoL (ULTRAM) 50 mg tablet TAKE 1 TABLET BY MOUTH EVERY 8 (EIGHT) HOURS AS NEEDED FOR UP TO 5 DAYS FOR SEVERE PAIN. (Patient not taking: Reported on 03/24/2022)        No current facility-administered medications on file prior to visit.           Family History  Problem Relation Age of Onset   Obesity Mother     Hyperlipidemia (Elevated cholesterol) Mother     Diabetes Mother     High blood pressure (Hypertension) Sister     Hyperlipidemia (Elevated cholesterol) Sister     Diabetes Brother        Social History        Tobacco Use  Smoking Status Former   Types: Cigarettes   Quit date:  1990   Years since quitting: 33.6  Smokeless Tobacco Never      Social History         Socioeconomic History   Marital status: Divorced  Tobacco Use   Smoking status: Former      Types: Cigarettes      Quit date: 1990      Years since quitting: 33.6   Smokeless tobacco: Never  Substance and Sexual Activity   Alcohol use: Yes      Alcohol/week: 1.0 standard drink      Types: 1 Standard drinks or equivalent per week   Drug use: Never      Objective:    Ht '5\' 3"'$  (1.6 m)   Wt 84.8 kg   BMI 33.12 kg/m     Exam Gen: NAD CV: RRR Lungs: CTA Abd: soft       Labs, Imaging and Diagnostic Testing: CEA 3.5 MRI completed June 2023 shows T3b disease with early EMVI, ~7-9cm from the sphincters CT scan of the chest abdomen pelvis completed in June 2023 shows no obvious metastatic disease in the chest abdomen and pelvis with small pulmonary nodules noted with calcifications likely to be granulomas. Assessment and Plan:    76 year old female who presented to the office for evaluation of a stage II, possible stage III rectal cancer approximately 12 cm from the anal verge (tattooed).   She underwent chemotherapy and radiation.  Final radiation treatment was May 28, 2022.  I have recommended low anterior resection in early November.  We discussed there is a small chance she would need a diverting ileostomy but I think this is less than 10%.   The surgery and anatomy were described to the patient as well as the risks of surgery and the possible complications.  These include: Bleeding, deep abdominal infections and possible wound complications such as hernia and infection, damage to adjacent structures, leak of surgical connections, which can lead to other surgeries and possibly an ostomy, possible need for other procedures, such as abscess drains in radiology, possible prolonged hospital stay, possible diarrhea from removal of part of the colon, possible constipation from narcotics,  possible bowel, bladder or sexual dysfunction if having rectal surgery, prolonged fatigue/weakness or appetite loss, possible early recurrence of of  disease, possible complications of their medical problems such as heart disease or arrhythmias or lung problems, death (less than 1%). I believe the patient understands and wishes to proceed with the surgery.    No follow-ups on file.    Destiny Adie, MD Colon and Rectal Surgery [facility]

## 2022-08-14 ENCOUNTER — Encounter (HOSPITAL_COMMUNITY): Payer: Self-pay | Admitting: General Surgery

## 2022-08-14 LAB — BASIC METABOLIC PANEL
Anion gap: 10 (ref 5–15)
BUN: 10 mg/dL (ref 8–23)
CO2: 28 mmol/L (ref 22–32)
Calcium: 8.4 mg/dL — ABNORMAL LOW (ref 8.9–10.3)
Chloride: 103 mmol/L (ref 98–111)
Creatinine, Ser: 0.96 mg/dL (ref 0.44–1.00)
GFR, Estimated: >60 mL/min (ref >60)
Glucose, Bld: 108 mg/dL — ABNORMAL HIGH (ref 70–99)
Potassium: 5 mmol/L (ref 3.5–5.1)
Sodium: 141 mmol/L (ref 135–145)

## 2022-08-14 LAB — CBC
HCT: 32.1 % — ABNORMAL LOW (ref 36.0–46.0)
Hemoglobin: 10.3 g/dL — ABNORMAL LOW (ref 12.0–15.0)
MCH: 30.1 pg (ref 26.0–34.0)
MCHC: 32.1 g/dL (ref 30.0–36.0)
MCV: 93.9 fL (ref 80.0–100.0)
Platelets: 243 10*3/uL (ref 150–400)
RBC: 3.42 MIL/uL — ABNORMAL LOW (ref 3.87–5.11)
RDW: 16 % — ABNORMAL HIGH (ref 11.5–15.5)
WBC: 6.5 10*3/uL (ref 4.0–10.5)
nRBC: 0 % (ref 0.0–0.2)

## 2022-08-14 LAB — GLUCOSE, CAPILLARY
Glucose-Capillary: 107 mg/dL — ABNORMAL HIGH (ref 70–99)
Glucose-Capillary: 113 mg/dL — ABNORMAL HIGH (ref 70–99)
Glucose-Capillary: 84 mg/dL (ref 70–99)
Glucose-Capillary: 70 mg/dL (ref 70–99)

## 2022-08-14 MED ORDER — TRAMADOL HCL 50 MG PO TABS: 50.0000 mg | ORAL_TABLET | Freq: Four times a day (QID) | ORAL | Status: DC | PRN

## 2022-08-14 MED ORDER — ACETAMINOPHEN 500 MG PO TABS
1000.0000 mg | ORAL_TABLET | Freq: Four times a day (QID) | ORAL | Status: DC
  Administered 2022-08-14 – 2022-08-16 (×8): 1000 mg via ORAL
  Filled 2022-08-14 (×9): qty 2

## 2022-08-14 MED ORDER — ORAL CARE MOUTH RINSE: 15.0000 mL | OROMUCOSAL | Status: DC | PRN

## 2022-08-14 NOTE — Progress Notes (Signed)
Pt ambulated 25 ft last night and 75 ft this morning on hallway with two person assist, tolerated fairly. Denies any pain.

## 2022-08-14 NOTE — Progress Notes (Signed)
Patient incontinent of liquid brown stool upon standing.  Entereg discontinued as per [facility] instructions to DC after BM.

## 2022-08-14 NOTE — Progress Notes (Signed)
Mobility Specialist - Progress Note   08/14/22 1549  Mobility  Activity Ambulated with assistance in hallway  Level of Assistance Modified independent, requires aide device or extra time  Assistive Device Front wheel walker  Distance Ambulated (ft) 220 ft  Range of Motion/Exercises Active  Activity Response Tolerated well  Mobility Referral Yes  $Mobility charge 1 Mobility   Pt was found in bed and agreeable to ambulate. Became fatigued towards the EOS and returned to bed with all necessities in reach.  Ferd Hibbs Mobility Specialist

## 2022-08-14 NOTE — Progress Notes (Signed)
1 Day Post-Op Robotic LAR Subjective: No issues overnight, ambulating in hall, tolerating clears  Objective: Vital signs in last 24 hours: Temp:  [97 F (36.1 C)-97.7 F (36.5 C)] 97.7 F (36.5 C) (11/02 0312) Pulse Rate:  [64-125] 70 (11/02 0312) Resp:  [14-22] 18 (11/02 0312) BP: (92-119)/(61-80) 92/61 (11/02 0312) SpO2:  [94 %-97 %] 97 % (11/02 0312) Weight:  [84.8 kg-88.6 kg] 88.6 kg (11/01 1800)   Intake/Output from previous day: 11/01 0701 - 11/02 0700 In: 3100 [I.V.:2750; IV Piggyback:350] Out: 2052 [Urine:1800; Drains:152; Blood:100] Intake/Output this shift: No intake/output data recorded.   General appearance: alert and cooperative GI: normal findings: soft, non-tender  Incision: no significant drainage  Lab Results:  Recent Labs    08/14/22 0407  WBC 6.5  HGB 10.3*  HCT 32.1*  PLT 243   BMET Recent Labs    08/14/22 0407  NA 141  K 5.0  CL 103  CO2 28  GLUCOSE 108*  BUN 10  CREATININE 0.96  CALCIUM 8.4*   PT/INR No results for input(s): "LABPROT", "INR" in the last 72 hours. ABG No results for input(s): "PHART", "HCO3" in the last 72 hours.  Invalid input(s): "PCO2", "PO2"  MEDS, Scheduled  alvimopan  12 mg Oral BID   enoxaparin (LOVENOX) injection  40 mg Subcutaneous Q24H   feeding supplement  237 mL Oral BID BM   gabapentin  300 mg Oral BID   insulin aspart  0-20 Units Subcutaneous TID WC   insulin aspart  0-5 Units Subcutaneous QHS   levothyroxine  100 mcg Oral Q0600   [START ON 08/15/2022] metFORMIN  500 mg Oral BID PC   pravastatin  40 mg Oral QPM   saccharomyces boulardii  250 mg Oral BID    Studies/Results: No results found.  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   Kidney stone 01/31/2021   Diverticulosis of colon 01/31/2021   Encounter for general adult medical examination with abnormal findings 01/31/2021   High blood pressure 01/31/2021   History of depression 01/31/2021   Hyperlipidemia 01/31/2021   Morbid obesity ([place]) 01/31/2021   Neuropathy 01/31/2021   Non-toxic multinodular goiter 01/31/2021   Onychomycosis 01/31/2021   Penicillin allergy 01/31/2021   Personal history of (healed) traumatic fracture 01/31/2021   Personal history of colonic polyps 01/31/2021   Pure hypercholesterolemia 01/31/2021   Tinnitus 01/31/2021   Vitamin D deficiency 01/31/2021   Lower limb ulcer, heel or midfoot, left, limited to breakdown of skin ([place]) 04/11/2020   Neck mass 02/07/2020   Trimalleolar fracture of ankle, closed, left, initial encounter 09/24/2017   Tibia/fibula fracture 09/23/2017   Closed left ankle fracture 09/23/2017   Ankle dislocation, left, initial encounter 09/23/2017   Thyroid disease    Sleep apnea    Osteoporosis    Type 2 diabetes mellitus ([place])    Oth intartic fracture of lower end of right radius, init 07/29/2016   Anal and rectal polyp 12/16/2013   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

## 2022-08-14 NOTE — Progress Notes (Signed)
Patient incontinent of liquid brown stool, patient very concerned that she will be incontinent of stool from now on.  I asked patient to discuss this with surgeon in a.m.

## 2022-08-15 ENCOUNTER — Encounter (HOSPITAL_BASED_OUTPATIENT_CLINIC_OR_DEPARTMENT_OTHER): Payer: PPO | Admitting: General Surgery

## 2022-08-15 LAB — CBC
HCT: 28.2 % — ABNORMAL LOW (ref 36.0–46.0)
Hemoglobin: 8.7 g/dL — ABNORMAL LOW (ref 12.0–15.0)
MCH: 29.3 pg (ref 26.0–34.0)
MCHC: 30.9 g/dL (ref 30.0–36.0)
MCV: 94.9 fL (ref 80.0–100.0)
Platelets: 181 10*3/uL (ref 150–400)
RBC: 2.97 MIL/uL — ABNORMAL LOW (ref 3.87–5.11)
RDW: 16.1 % — ABNORMAL HIGH (ref 11.5–15.5)
WBC: 6.9 10*3/uL (ref 4.0–10.5)
nRBC: 0 % (ref 0.0–0.2)

## 2022-08-15 LAB — GLUCOSE, CAPILLARY
Glucose-Capillary: 106 mg/dL — ABNORMAL HIGH (ref 70–99)
Glucose-Capillary: 113 mg/dL — ABNORMAL HIGH (ref 70–99)
Glucose-Capillary: 133 mg/dL — ABNORMAL HIGH (ref 70–99)
Glucose-Capillary: 131 mg/dL — ABNORMAL HIGH (ref 70–99)

## 2022-08-15 LAB — BASIC METABOLIC PANEL
Anion gap: 8 (ref 5–15)
BUN: 14 mg/dL (ref 8–23)
CO2: 29 mmol/L (ref 22–32)
Calcium: 8 mg/dL — ABNORMAL LOW (ref 8.9–10.3)
Chloride: 100 mmol/L (ref 98–111)
Creatinine, Ser: 0.81 mg/dL (ref 0.44–1.00)
GFR, Estimated: >60 mL/min (ref >60)
Glucose, Bld: 120 mg/dL — ABNORMAL HIGH (ref 70–99)
Potassium: 4.1 mmol/L (ref 3.5–5.1)
Sodium: 137 mmol/L (ref 135–145)

## 2022-08-15 MED ORDER — CALCIUM POLYCARBOPHIL 625 MG PO TABS
625.0000 mg | ORAL_TABLET | Freq: Two times a day (BID) | ORAL | Status: DC
  Administered 2022-08-15 – 2022-08-16 (×3): 625 mg via ORAL
  Filled 2022-08-15 (×3): qty 1

## 2022-08-15 NOTE — Progress Notes (Signed)
2 Days Post-Op Robotic LAR Subjective: No issues overnight, ambulating in hall, tolerating fulls, having bowel function, complains of some right sided pain  Objective: Vital signs in last 24 hours: Temp:  [97.8 F (36.6 C)-99.1 F (37.3 C)] 99.1 F (37.3 C) (11/03 0647) Pulse Rate:  [79-92] 86 (11/03 0647) Resp:  [17-18] 18 (11/03 0647) BP: (97-109)/(45-64) 109/53 (11/03 0647) SpO2:  [90 %-94 %] 90 % (11/03 0647) Weight:  [86.1 kg] 86.1 kg (11/03 0651)   Intake/Output from previous day: 11/02 0701 - 11/03 0700 In: 1080 [P.O.:1080] Out: 1895 [Urine:1825; Drains:70] Intake/Output this shift: No intake/output data recorded.   General appearance: alert and cooperative GI: normal findings: soft, non-tender  Incision: no significant drainage  Lab Results:  Recent Labs    08/14/22 0407 08/15/22 0414  WBC 6.5 6.9  HGB 10.3* 8.7*  HCT 32.1* 28.2*  PLT 243 181    BMET Recent Labs    08/14/22 0407 08/15/22 0414  NA 141 137  K 5.0 4.1  CL 103 100  CO2 28 29  GLUCOSE 108* 120*  BUN 10 14  CREATININE 0.96 0.81  CALCIUM 8.4* 8.0*    PT/INR No results for input(s): "LABPROT", "INR" in the last 72 hours. ABG No results for input(s): "PHART", "HCO3" in the last 72 hours.  Invalid input(s): "PCO2", "PO2"  MEDS, Scheduled  acetaminophen  1,000 mg Oral Q6H   enoxaparin (LOVENOX) injection  40 mg Subcutaneous Q24H   feeding supplement  237 mL Oral BID BM   gabapentin  300 mg Oral BID   insulin aspart  0-20 Units Subcutaneous TID WC   insulin aspart  0-5 Units Subcutaneous QHS   levothyroxine  100 mcg Oral Q0600   metFORMIN  500 mg Oral BID PC   pravastatin  40 mg Oral QPM   saccharomyces boulardii  250 mg Oral BID    Studies/Results: No results found.  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   Kidney stone 01/31/2021   Diverticulosis of colon 01/31/2021   Encounter for general adult medical examination with abnormal findings 01/31/2021   High blood pressure 01/31/2021   History of depression 01/31/2021   Hyperlipidemia 01/31/2021   Morbid obesity ([place]) 01/31/2021   Neuropathy 01/31/2021   Non-toxic multinodular goiter 01/31/2021   Onychomycosis 01/31/2021   Penicillin allergy 01/31/2021   Personal history of (healed) traumatic fracture 01/31/2021   Personal history of colonic polyps 01/31/2021   Pure hypercholesterolemia 01/31/2021   Tinnitus 01/31/2021   Vitamin D deficiency 01/31/2021   Lower limb ulcer, heel or midfoot, left, limited to breakdown of skin ([place]) 04/11/2020   Neck mass 02/07/2020   Trimalleolar fracture of ankle, closed, left, initial encounter 09/24/2017   Tibia/fibula fracture 09/23/2017   Closed left ankle fracture 09/23/2017   Ankle dislocation, left, initial encounter 09/23/2017   Thyroid disease    Sleep apnea    Osteoporosis    Type 2 diabetes mellitus ([place])    Oth intartic fracture of lower end of right radius, init 07/29/2016   Anal and  rectal polyp 12/16/2013   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

## 2022-08-15 NOTE — Progress Notes (Signed)
Mobility Specialist - Progress Note   08/15/22 1446  Mobility  Activity Ambulated with assistance in hallway  Level of Assistance Modified independent, requires aide device or extra time  Assistive Device Front wheel walker  Distance Ambulated (ft) 350 ft  Range of Motion/Exercises Active  Activity Response Tolerated well  Mobility Referral Yes  $Mobility charge 1 Mobility   Pt was found in bed and agreeable to ambulate. Became fatigued by the EOS and returned to bed with all necessities in reach.   Ferd Hibbs Mobility Specialist

## 2022-08-15 NOTE — TOC Initial Note (Signed)
Transition of Care [facility]) - Initial/Assessment Note    Patient Details  Name: Destiny [name] MRN: [id number] Date of Birth: 06-24-1946  Transition of Care [facility]) CM/SW Contact:    Leeroy Cha, RN Phone Number: 08/15/2022, 7:43 AM  Clinical Narrative:                  Transition of Care [facility]) Screening Note   Patient Details  Name: Destiny [name] Date of Birth: 03-07-1946   Transition of Care [facility]) CM/SW Contact:    Leeroy Cha, RN Phone Number: 08/15/2022, 7:43 AM    Transition of Care Department [facility]) has reviewed patient and no TOC needs have been identified at this time. We will continue to monitor patient advancement through interdisciplinary progression rounds. If new patient transition needs arise, please place a TOC consult.    Expected Discharge Plan: Home/Self Care Barriers to Discharge: Continued Medical Work up   Patient Goals and CMS Choice Patient states their goals for this hospitalization and ongoing recovery are:: to return home CMS Medicare.gov Compare Post Acute Care list provided to:: Patient Choice offered to / list presented to : Patient  Expected Discharge Plan and Services Expected Discharge Plan: Home/Self Care   Discharge Planning Services: CM Consult   Living arrangements for the past 2 months: Single Family Home                                      Prior Living Arrangements/Services Living arrangements for the past 2 months: Single Family Home Lives with:: Self Patient language and need for interpreter reviewed:: Yes Do you feel safe going back to the place where you live?: Yes            Criminal Activity/Legal Involvement Pertinent to Current Situation/Hospitalization: No - Comment as needed  Activities of Daily Living Home Assistive Devices/Equipment: Eyeglasses, CBG Meter, Walker (specify type), Wheelchair, Bedside commode/3-in-1 ADL Screening (condition at time of admission) Patient's cognitive  ability adequate to safely complete daily activities?: Yes Is the patient deaf or have difficulty hearing?: No Does the patient have difficulty seeing, even when wearing glasses/contacts?: No Does the patient have difficulty concentrating, remembering, or making decisions?: No Patient able to express need for assistance with ADLs?: Yes Does the patient have difficulty dressing or bathing?: No Independently performs ADLs?: Yes (appropriate for developmental age) Does the patient have difficulty walking or climbing stairs?: Yes (wound to left heel, can walk up steps if had to) Weakness of Legs: None Weakness of Arms/Hands: None  Permission Sought/Granted                  Emotional Assessment Appearance:: Appears stated age Attitude/Demeanor/Rapport: Engaged Affect (typically observed): Calm Orientation: : Oriented to Self, Oriented to Place, Oriented to  Time, Oriented to Situation Alcohol / Substance Use: Alcohol Use, Tobacco Use (quit smoking 33.6 years ago, current etoh use-weekly.no drug) Psych Involvement: No (comment)  Admission diagnosis:  Rectal cancer ([place]) [C20] Patient Active Problem List   Diagnosis Date Noted   Rectal cancer ([place]) 04/07/2022   Melanoma of skin ([place]) 04/09/2021   Acquired trigger finger 01/31/2021   Allergic rhinitis 01/31/2021   Body mass index (BMI) 40.0-44.9, adult ([place]) 01/31/2021   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   Dependence on other enabling machines and devices 01/31/2021   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   Kidney stone 01/31/2021   Diverticulosis of colon  01/31/2021   Encounter for general adult medical examination with abnormal findings 01/31/2021   High blood pressure 01/31/2021   History of depression 01/31/2021   Hyperlipidemia 01/31/2021   Morbid obesity ([place]) 01/31/2021   Neuropathy 01/31/2021   Non-toxic multinodular goiter 01/31/2021   Onychomycosis 01/31/2021   Penicillin allergy 01/31/2021   Personal  history of (healed) traumatic fracture 01/31/2021   Personal history of colonic polyps 01/31/2021   Pure hypercholesterolemia 01/31/2021   Tinnitus 01/31/2021   Vitamin D deficiency 01/31/2021   Lower limb ulcer, heel or midfoot, left, limited to breakdown of skin ([place]) 04/11/2020   Neck mass 02/07/2020   Trimalleolar fracture of ankle, closed, left, initial encounter 09/24/2017   Tibia/fibula fracture 09/23/2017   Closed left ankle fracture 09/23/2017   Ankle dislocation, left, initial encounter 09/23/2017   Thyroid disease    Sleep apnea    Osteoporosis    Type 2 diabetes mellitus ([place])    Oth intartic fracture of lower end of right radius, init 07/29/2016   Anal and rectal polyp 12/16/2013   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

## 2022-08-15 NOTE — Evaluation (Signed)
Physical Therapy Evaluation Patient Details Name: MACKYNZIE WOOLFORD MRN: [id number] DOB: 01/12/1946 Today's Date: 08/15/2022  History of Present Illness  76 YO female, S/P  ROBOTIC ASSISTED LOWER ANTERIOR RESECTION, JP drain. PMH:L ankle ORIF,Diabetes mellitus without complication,History of cancer,Sleep apnea,  Thyroid disease  Clinical Impression  The patient admitted for above medical problems.  Patient has been ambulating with Rw and staff, no acute PT needs at this time. PT will sign off.       Recommendations for follow up therapy are one component of a multi-disciplinary discharge planning process, led by the attending physician.  Recommendations may be updated based on patient status, additional functional criteria and insurance authorization.  Follow Up Recommendations No PT follow up      Assistance Recommended at Discharge Set up Supervision/Assistance  Patient can return home with the following  A little help with walking and/or transfers;A little help with bathing/dressing/bathroom;Help with stairs or ramp for entrance    Equipment Recommendations None recommended by PT  Recommendations for Other Services       Functional Status Assessment Patient has not had a recent decline in their functional status     Precautions / Restrictions Precautions Precautions: Fall Precaution Comments: left Restrictions Weight Bearing Restrictions: No   ABD binder ordered, not yet been ordered, asked secr. To order     Mobility  Bed Mobility               General bed mobility comments: inrecliner    Transfers Overall transfer level: Needs assistance Equipment used: Rolling walker (2 wheels) Transfers: Sit to/from Stand Sit to Stand: Min guard           General transfer comment: extra effort to rise, increased pain reported    Ambulation/Gait Ambulation/Gait assistance: Min guard Gait Distance (Feet): 250 Feet Assistive device: Rolling walker (2 wheels) Gait  Pattern/deviations: Step-to pattern, Step-through pattern       General Gait Details: decreased  Stairs            Wheelchair Mobility    Modified Rankin (Stroke Patients Only)       Balance Overall balance assessment: Mild deficits observed, not formally tested                                           Pertinent Vitals/Pain Pain Assessment Pain Assessment: Faces Faces Pain Scale: Hurts little more Pain Location: abdomen Pain Descriptors / Indicators: Aching, Grimacing Pain Intervention(s): Monitored during session    Home Living Family/patient expects to be discharged to:: Private residence Living Arrangements: Alone Available Help at Discharge: Family;Available 24 hours/day;Friend(s) Type of Home: House Home Access: Stairs to enter   Entrance Stairs-Number of Steps: 1   Home Layout: Two level;Able to live on main level with bedroom/bathroom Home Equipment: Rolling Walker (2 wheels);Rollator (4 wheels);Cane - single point;Toilet riser      Prior Function Prior Level of Function : Independent/Modified Independent                     Hand Dominance   Dominant Hand: Right    Extremity/Trunk Assessment   Upper Extremity Assessment Upper Extremity Assessment: Overall WFL for tasks assessed    Lower Extremity Assessment Lower Extremity Assessment: Generalized weakness;LLE deficits/detail LLE Deficits / Details: dressing on the foot, has heel sore    Cervical / Trunk Assessment Cervical / Trunk Assessment:  Normal  Communication   Communication: No difficulties  Cognition Arousal/Alertness: Awake/alert Behavior During Therapy: WFL for tasks assessed/performed Overall Cognitive Status: Within Functional Limits for tasks assessed                                          General Comments General comments (skin integrity, edema, etc.): did lose balance after return to recliner, was reaching to the left and  faltered, PT supported    Exercises     Assessment/Plan    PT Assessment Patient does not need any further PT services  PT Problem List Decreased strength;Decreased mobility;Decreased balance;Pain       PT Treatment Interventions DME instruction;Therapeutic activities;Gait training    PT Goals (Current goals can be found in the Care Plan section)  Acute Rehab PT Goals Patient Stated Goal: go home PT Goal Formulation: All assessment and education complete, DC therapy    Frequency       Co-evaluation               AM-PAC PT "6 Clicks" Mobility  Outcome Measure Help needed turning from your back to your side while in a flat bed without using bedrails?: None Help needed moving from lying on your back to sitting on the side of a flat bed without using bedrails?: None Help needed moving to and from a bed to a chair (including a wheelchair)?: A Little Help needed standing up from a chair using your arms (e.g., wheelchair or bedside chair)?: A Little Help needed to walk in hospital room?: A Little Help needed climbing 3-5 steps with a railing? : A Little 6 Click Score: 20    End of Session Equipment Utilized During Treatment: Gait belt Activity Tolerance: Patient tolerated treatment well Patient left: in chair;with call bell/phone within reach;with family/visitor present Nurse Communication: Mobility status PT Visit Diagnosis: Unsteadiness on feet (R26.81)    Time: 7673-4193 PT Time Calculation (min) (ACUTE ONLY): 14 min   Charges:   PT Evaluation $PT Eval Low Complexity: 1 Low          [place] Office [phone] Weekend [phone]   Claretha Cooper 08/15/2022, 1:29 PM

## 2022-08-16 LAB — CBC
HCT: 27 % — ABNORMAL LOW (ref 36.0–46.0)
Hemoglobin: 8.5 g/dL — ABNORMAL LOW (ref 12.0–15.0)
MCH: 30 pg (ref 26.0–34.0)
MCHC: 31.5 g/dL (ref 30.0–36.0)
MCV: 95.4 fL (ref 80.0–100.0)
Platelets: 177 10*3/uL (ref 150–400)
RBC: 2.83 MIL/uL — ABNORMAL LOW (ref 3.87–5.11)
RDW: 16.1 % — ABNORMAL HIGH (ref 11.5–15.5)
WBC: 6.7 10*3/uL (ref 4.0–10.5)
nRBC: 0 % (ref 0.0–0.2)

## 2022-08-16 LAB — BASIC METABOLIC PANEL
Anion gap: 8 (ref 5–15)
BUN: 18 mg/dL (ref 8–23)
CO2: 29 mmol/L (ref 22–32)
Calcium: 8.2 mg/dL — ABNORMAL LOW (ref 8.9–10.3)
Chloride: 102 mmol/L (ref 98–111)
Creatinine, Ser: 0.96 mg/dL (ref 0.44–1.00)
GFR, Estimated: >60 mL/min (ref >60)
Glucose, Bld: 121 mg/dL — ABNORMAL HIGH (ref 70–99)
Potassium: 3.7 mmol/L (ref 3.5–5.1)
Sodium: 139 mmol/L (ref 135–145)

## 2022-08-16 LAB — GLUCOSE, CAPILLARY
Glucose-Capillary: 96 mg/dL (ref 70–99)
Glucose-Capillary: 125 mg/dL — ABNORMAL HIGH (ref 70–99)

## 2022-08-16 MED ORDER — TRAMADOL HCL 50 MG PO TABS: 50.0000 mg | ORAL_TABLET | Freq: Four times a day (QID) | ORAL | 0 refills | Status: DC | PRN

## 2022-08-16 NOTE — Discharge Summary (Signed)
Physician Discharge Summary  Patient ID: Destiny [name] MRN: [id number] DOB/AGE: 11/06/1945 76 y.o.  Admit date: 08/13/2022 Discharge date: 08/16/2022  Admission Diagnoses: Rectal cancer Discharge Diagnoses:  Principal Problem:   Rectal cancer [facility])   Discharged Condition: good  Hospital Course: Patient was admitted to the med surg floor after surgery.  Diet was advanced as tolerated.  Patient began to have bowel function on postop day 1.  By postop day 3, she was tolerating a solid diet and pain was controlled with oral medications.  She was urinating without difficulty and ambulating without assistance.  Patient was felt to be in stable condition for discharge to home. She will be discharged with drain in place for removal in the office later next week.  Consults: None  Significant Diagnostic Studies: labs: CBC, BMET  Treatments: IV hydration, analgesia: acetaminophen, and surgery: robotic LAR  Discharge Exam: Blood pressure 118/66, pulse 82, temperature 98.3 F (36.8 C), temperature source Oral, resp. rate 20, height '5\' 3"'$  (1.6 m), weight 86.1 kg, SpO2 96 %. General appearance: alert and cooperative GI: soft, nondistended Incision/Wound: clean, dry  Disposition: Discharge disposition: 01-Home or Self Care        Allergies as of 08/16/2022       Reactions   Aspirin Other (See Comments)   Stomach upset   Atorvastatin Other (See Comments)   cramps   Chlorhexidine Gluconate [chlorhexidine] Itching   Fentanyl Nausea Only   Lisinopril Other (See Comments)   dizziness   Penicillins Swelling   Has patient had a PCN reaction causing immediate rash, facial/tongue/throat swelling, SOB or lightheadedness with hypotension: Yes Has patient had a PCN reaction causing severe rash involving mucus membranes or skin necrosis: unknown Has patient had a PCN reaction that required hospitalization: No Has patient had a PCN reaction occurring within the last 10 years: No If all of  the above answers are "NO", then may proceed with Cephalosporin use.   Hydrocodone Other (See Comments)   "Does not like how it makes me feel        Medication List     STOP taking these medications    capecitabine 500 MG tablet Commonly known as: XELODA       TAKE these medications    acetaminophen 500 MG tablet Commonly known as: TYLENOL Take 1,000 mg by mouth every 6 (six) hours as needed for moderate pain.   aspirin EC 81 MG tablet Take 81 mg by mouth in the morning.   ezetimibe 10 MG tablet Commonly known as: ZETIA Take 10 mg by mouth in the morning.   levothyroxine 100 MCG tablet Commonly known as: SYNTHROID Take 100 mcg by mouth daily before breakfast.   metFORMIN 500 MG 24 hr tablet Commonly known as: GLUCOPHAGE-XR Take 500 mg by mouth 2 (two) times daily.   Ocuvite Extra Tabs Take 1 tablet by mouth in the morning and at bedtime.   OneTouch Ultra test strip Generic drug: glucose blood   pravastatin 40 MG tablet Commonly known as: PRAVACHOL Take 40 mg by mouth every evening.   Prolia 60 MG/ML Sosy injection Generic drug: denosumab Inject 60 mg into the skin every 6 (six) months.   traMADol 50 MG tablet Commonly known as: ULTRAM Take 1-2 tablets (50-100 mg total) by mouth every 6 (six) hours as needed for moderate pain or severe pain.   Vitamin D3 50 MCG (2000 UT) Tabs Take 2,000 Units by mouth in the morning.        Follow-up  Information     Leighton Ruff, MD. Schedule an appointment as soon as possible for a visit in 2 week(s).   Specialties: General Surgery, Colon and Rectal Surgery Contact information: [place] [phone] [facility], [place] Follow up in 1 week(s).   Specialty: General Surgery Why: for drain removal Contact information: [address] [place] [phone]                Signed: Rosario Adie 48/02/9275, 8:31 AM

## 2022-08-16 NOTE — Discharge Instructions (Signed)
ABDOMINAL SURGERY: POST OP INSTRUCTIONS  DIET: Follow a light bland diet the first 24 hours after arrival home, such as soup, liquids, crackers, etc.  Be sure to include lots of fluids daily.  Avoid fast food or heavy meals as your are more likely to get nauseated.  Do not eat any uncooked fruits or vegetables for the next 2 weeks as your colon heals. Take your usually prescribed home medications unless otherwise directed. PAIN CONTROL: Pain is best controlled by a usual combination of three different methods TOGETHER: Ice/Heat Over the counter pain medication Prescription pain medication Most patients will experience some swelling and bruising around the incisions.  Ice packs or heating pads (30-60 minutes up to 6 times a day) will help. Use ice for the first few days to help decrease swelling and bruising, then switch to heat to help relax tight/sore spots and speed recovery.  Some people prefer to use ice alone, heat alone, alternating between ice & heat.  Experiment to what works for you.  Swelling and bruising can take several weeks to resolve.   It is helpful to take an over-the-counter pain medication regularly for the first few weeks.  Choose one of the following that works best for you: Naproxen (Aleve, etc)  Two '220mg'$  tabs twice a day Ibuprofen (Advil, etc) Three '200mg'$  tabs four times a day (every meal & bedtime) Acetaminophen (Tylenol, etc) 500-'650mg'$  four times a day (every meal & bedtime) A  prescription for pain medication (such as oxycodone, hydrocodone, etc) should be given to you upon discharge.  Take your pain medication as prescribed.  If you are having problems/concerns with the prescription medicine (does not control pain, nausea, vomiting, rash, itching, etc), please call us [phone] to see if we need to switch you to a different pain medicine that will work better for you and/or control your side effect better. If you need a refill on your pain medication, please contact  your pharmacy.  They will contact our office to request authorization. Prescriptions will not be filled after 5 pm or on week-ends. Avoid getting constipated.  Between the surgery and the pain medications, it is common to experience some constipation.  Increasing fluid intake and taking a fiber supplement (such as Metamucil, Citrucel, FiberCon, MiraLax, etc) 1-2 times a day regularly will usually help prevent this problem from occurring.  A mild laxative (prune juice, Milk of Magnesia, MiraLax, etc) should be taken according to package directions if there are no bowel movements after 48 hours.   Watch out for diarrhea.  If you have many loose bowel movements, simplify your diet to bland foods & liquids for a few days.  Stop any stool softeners and decrease your fiber supplement.  Switching to mild anti-diarrheal medications (Kayopectate, Pepto Bismol) can help.  If this worsens or does not improve, please call us. Wash / shower every day.  You may shower over the incision / wound.  Avoid baths until the skin is fully healed.  Continue to shower over incision(s) after the dressing is off. Change your lower dressing every day.  Wash with soap and water between dressing changes. Empty JP bulb daily.  You do not need to record the outputs ACTIVITIES as tolerated:   You may resume regular (light) daily activities beginning the next day--such as daily self-care, walking, climbing stairs--gradually increasing activities as tolerated.  If you can walk 30 minutes without difficulty, it is safe to try more intense activity such as jogging, treadmill, bicycling, low-impact aerobics,  swimming, etc. Save the most intensive and strenuous activity for last such as sit-ups, heavy lifting, contact sports, etc  Refrain from any heavy lifting or straining until you are off narcotics for pain control.   DO NOT PUSH THROUGH PAIN.  Let pain be your guide: If it hurts to do something, don't do it.  Pain is your body warning you  to avoid that activity for another week until the pain goes down. You may drive when you are no longer taking prescription pain medication, you can comfortably wear a seatbelt, and you can safely maneuver your car and apply brakes. You may have sexual intercourse when it is comfortable.  FOLLOW UP in our office Please call [facility] at [phone] to set up an appointment to see your surgeon in the office for a follow-up appointment approximately 1-2 weeks after your surgery. Make sure that you call for this appointment the day you arrive home to insure a convenient appointment time. 10. IF YOU HAVE DISABILITY OR FAMILY LEAVE FORMS, BRING THEM TO THE OFFICE FOR PROCESSING.  DO NOT GIVE THEM TO YOUR DOCTOR.   WHEN TO CALL us [phone]: Poor pain control Reactions / problems with new medications (rash/itching, nausea, etc)  Fever over 101.5 F (38.5 C) Inability to urinate Nausea and/or vomiting Worsening swelling or bruising Continued bleeding from incision. Increased pain, redness, or drainage from the incision  The clinic staff is available to answer your questions during regular business hours (8:30am-5pm).  Please don't hesitate to call and ask to speak to one of our nurses for clinical concerns.   A surgeon from [facility] is always on call at the hospitals   If you have a medical emergency, go to the nearest emergency room or call 911.    [facility], [address], [place], [place], [place]  [address] ? MAIN: [phone] ? TOLL FREE: [phone] ? FAX [phone] [email or web address]

## 2022-08-16 NOTE — Progress Notes (Signed)
Removed honeycomb dressing and replaced with gauze and tape.

## 2022-08-18 ENCOUNTER — Ambulatory Visit: Payer: No Typology Code available for payment source | Admitting: Internal Medicine

## 2022-08-19 LAB — SURGICAL PATHOLOGY

## 2022-08-27 ENCOUNTER — Other Ambulatory Visit: Payer: Self-pay

## 2022-08-27 NOTE — Progress Notes (Signed)
The proposed treatment discussed in conference is for discussion purpose only and is not a binding recommendation.  The patients have not been physically examined, or presented with their treatment options.  Therefore, final treatment plans cannot be decided.  

## 2022-09-01 ENCOUNTER — Inpatient Hospital Stay: Payer: PPO | Attending: Nurse Practitioner | Admitting: Oncology

## 2022-09-01 ENCOUNTER — Inpatient Hospital Stay: Payer: PPO

## 2022-09-01 VITALS — BP 134/84 | HR 72 | Temp 97.9°F | Resp 18 | Wt 185.8 lb

## 2022-09-01 DIAGNOSIS — Z85820 Personal history of malignant melanoma of skin: Secondary | ICD-10-CM | POA: Diagnosis not present

## 2022-09-01 DIAGNOSIS — C20 Malignant neoplasm of rectum: Secondary | ICD-10-CM | POA: Diagnosis not present

## 2022-09-01 DIAGNOSIS — L97429 Non-pressure chronic ulcer of left heel and midfoot with unspecified severity: Secondary | ICD-10-CM | POA: Insufficient documentation

## 2022-09-01 DIAGNOSIS — E119 Type 2 diabetes mellitus without complications: Secondary | ICD-10-CM | POA: Insufficient documentation

## 2022-09-01 LAB — CEA (ACCESS): CEA (CHCC): 1.89 ng/mL (ref 0.00–5.00)

## 2022-09-01 NOTE — Progress Notes (Signed)
[facility] OFFICE PROGRESS NOTE   Diagnosis: Rectal cancer  INTERVAL HISTORY:   [name] underwent a low anterior resection 08/13/2022.  A tattoo was noted in the mid rectum.  No evidence of metastatic disease.  The anastomosis is at 9 cm from the anal verge.  She has developed fecal incontinence following surgery.  The left heel ulcer has almost completely healed.  She continues follow-up at the wound clinic.  Objective:  Vital signs in last 24 hours:  Blood pressure 134/84, pulse 72, temperature 97.9 F (36.6 C), temperature source Temporal, resp. rate 18, weight 185 lb 12.8 oz (84.3 kg), SpO2 99 %.    Lymphatics: No cervical, supraclavicular, axillary, or inguinal nodes Resp: Lungs clear bilaterally Cardio: Regular rate and rhythm GI: No hepatosplenomegaly, nontender, no mass Vascular: No leg edema Skin: Superficial ulceration at the left heel, completely healed except for the 1-1.5 cm central aspect.  Lab Results:  Lab Results  Component Value Date   WBC 6.7 08/16/2022   HGB 8.5 (L) 08/16/2022   HCT 27.0 (L) 08/16/2022   MCV 95.4 08/16/2022   PLT 177 08/16/2022   NEUTROABS 2.6 06/06/2022    CMP  Lab Results  Component Value Date   NA 139 08/16/2022   K 3.7 08/16/2022   CL 102 08/16/2022   CO2 29 08/16/2022   GLUCOSE 121 (H) 08/16/2022   BUN 18 08/16/2022   CREATININE 0.96 08/16/2022   CALCIUM 8.2 (L) 08/16/2022   PROT 6.8 06/06/2022   ALBUMIN 4.2 06/06/2022   AST 13 (L) 06/06/2022   ALT 8 06/06/2022   ALKPHOS 44 06/06/2022   BILITOT 0.6 06/06/2022   GFRNONAA >60 08/16/2022   GFRAA >60 09/24/2017    Lab Results  Component Value Date   CEA 3.5 (H) 03/06/2022   viewed the patient's current medications.   Assessment/Plan: Rectal cancer Colonoscopy 03/06/2022-nonobstructing mass at 12 cm from the anal verge-biopsy invasive moderately differentiated adenocarcinoma arising within a tubular adenoma with high-grade dysplasia CTs  03/13/2022-no rectal mass seen, no evidence of metastatic disease, small 2-4 mm pulmonary nodules-at least 2 are calcified, likely benign granulomas, nonobstructing stone in the interpolar right renal collecting system MRI pelvis 03/30/2022-tumor at 11-13 cm from the anal verge, T3b, early EMVI?,  No adenopathy Neoadjuvant radiation/capecitabine 04/21/2022-05/28/2022 Xeloda discontinued 05/15/2022 due to progressive breakdown/ulceration at the left heel. Low anterior resection 08/13/2022-negative for residual cancer,ypT0ypN0, 0/14 nodes margins negative, no distinct mass, 2.1 x 1.5 cm minimally ulcerated firm area of mucosa   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  Diabetes   4.  Laparoscopic gastric band surgery  5.  Left heel ulceration-progressive 05/15/2022.  Xeloda discontinued.  Followed at the wound clinic.      Disposition: Destiny [name] has a history of rectal cancer.  She completed a course of neoadjuvant Xeloda and radiation.  Xeloda was discontinued prematurely due to opening of a wound at the left heel. She underwent a low anterior resection and the pathology is consistent with a complete pathologic response.  Her case was presented at the GI tumor conference last week.  Consensus recommendation is to follow her with observation.  We discussed adjuvant 5-FU/leucovorin with the hope this would lessen skin toxicity.  She is comfortable with observation.  She will continue follow-up with wound clinic.  She  will return to the lab for a CEA today.  She will return for an office visit and CEA in 6 months.  She will be referred to the pelvic physical therapy clinic.  Betsy Coder, MD  09/01/2022  3:39 PM

## 2022-09-02 ENCOUNTER — Other Ambulatory Visit: Payer: Self-pay | Admitting: Physician Assistant

## 2022-09-02 ENCOUNTER — Telehealth: Payer: Self-pay | Admitting: *Deleted

## 2022-09-02 ENCOUNTER — Telehealth: Payer: Self-pay

## 2022-09-02 NOTE — Telephone Encounter (Signed)
Patient gave verbal understanding and had no further questions or concerns  

## 2022-09-02 NOTE — Telephone Encounter (Signed)
-----   Message from Ladell Pier, MD sent at 09/01/2022  4:11 PM EST ----- Please call patient, Destiny [name] is normal, f/u as scheduled

## 2022-09-03 ENCOUNTER — Encounter (HOSPITAL_BASED_OUTPATIENT_CLINIC_OR_DEPARTMENT_OTHER): Payer: PPO | Attending: General Surgery | Admitting: General Surgery

## 2022-09-03 DIAGNOSIS — Y838 Other surgical procedures as the cause of abnormal reaction of the patient, or of later complication, without mention of misadventure at the time of the procedure: Secondary | ICD-10-CM | POA: Diagnosis not present

## 2022-09-03 DIAGNOSIS — Z85820 Personal history of malignant melanoma of skin: Secondary | ICD-10-CM | POA: Diagnosis not present

## 2022-09-03 DIAGNOSIS — Z923 Personal history of irradiation: Secondary | ICD-10-CM | POA: Insufficient documentation

## 2022-09-03 DIAGNOSIS — E11621 Type 2 diabetes mellitus with foot ulcer: Secondary | ICD-10-CM | POA: Insufficient documentation

## 2022-09-03 DIAGNOSIS — Z85048 Personal history of other malignant neoplasm of rectum, rectosigmoid junction, and anus: Secondary | ICD-10-CM | POA: Diagnosis not present

## 2022-09-03 DIAGNOSIS — L84 Corns and callosities: Secondary | ICD-10-CM | POA: Insufficient documentation

## 2022-09-03 DIAGNOSIS — T8131XA Disruption of external operation (surgical) wound, not elsewhere classified, initial encounter: Secondary | ICD-10-CM | POA: Insufficient documentation

## 2022-09-03 DIAGNOSIS — L97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: Secondary | ICD-10-CM | POA: Diagnosis not present

## 2022-09-03 DIAGNOSIS — Z9221 Personal history of antineoplastic chemotherapy: Secondary | ICD-10-CM | POA: Insufficient documentation

## 2022-09-03 DIAGNOSIS — S91302A Unspecified open wound, left foot, initial encounter: Secondary | ICD-10-CM | POA: Diagnosis not present

## 2022-09-03 NOTE — Progress Notes (Signed)
Destiny, [name] ([id number]) [id number] Page 1 of 9 Visit Report for 09/03/2022 Chief Complaint Document Details Patient Name: Date of Service: Destiny [name], Destiny [name] 09/03/2022 10:30 A M Medical Record Number: [id number] Patient Account Number: [id number] Date of Birth/Sex: Treating RN: 07/04/1946 (76 y.o. [name]) Primary Care Provider: Deland Pretty Other Clinician: Referring Provider: Treating Provider/Extender: Raylene Everts in Treatment: 15 Information Obtained from: Patient Chief Complaint Patient presents to the wound care center with open non-healing surgical wound(s) Electronic Signature(s) Signed: 09/03/2022 11:26:58 AM By: Fredirick Maudlin MD FACS Entered By: Fredirick Maudlin on 09/03/2022 11:26:58 -------------------------------------------------------------------------------- Debridement Details Patient Name: Date of Service: Destiny [name]. 09/03/2022 10:30 A M Medical Record Number: [id number] Patient Account Number: [id number] Date of Birth/Sex: Treating RN: [birth year] (76 y.o. Elam Dutch Primary Care Provider: Deland Pretty Other Clinician: Referring Provider: Treating Provider/Extender: Raylene Everts in Treatment: 15 Debridement Performed for Assessment: Wound #1 Left Calcaneus Performed By: Physician Fredirick Maudlin, MD Debridement Type: Debridement Level of Consciousness (Pre-procedure): Awake and Alert Pre-procedure Verification/Time Out Yes - 11:00 Taken: Start Time: 11:05 Pain Control: Lidocaine 4% T opical Solution T Area Debrided (L x W): otal 3.3 (cm) x 3 (cm) = 9.9 (cm) Tissue and other material debrided: Non-Viable, Callus, Slough, Skin: Epidermis, Slough Level: Skin/Epidermis Debridement Description: Selective/Open Wound Instrument: Curette Bleeding: Minimum Hemostasis Achieved: Pressure Procedural Pain: 0 Post Procedural Pain: 0 Response to Treatment: Procedure  was tolerated well Level of Consciousness (Post- Awake and Alert procedure): Post Debridement Measurements of Total Wound Length: (cm) 3.3 Width: (cm) 1.5 Depth: (cm) 0.1 Volume: (cm) 0.389 Character of Wound/Ulcer Post Debridement: Improved Post Procedure Diagnosis Same as Pre-procedure VERNEICE, CASPERS [name] ([id number]) [id number] Page 2 of 9 Notes scribed by Baruch Gouty, RN for Dr. Celine Ahr Electronic Signature(s) Signed: 09/03/2022 12:12:36 PM By: Fredirick Maudlin MD FACS Signed: 09/03/2022 3:43:48 PM By: Baruch Gouty RN, BSN Entered By: Baruch Gouty on 09/03/2022 11:22:58 -------------------------------------------------------------------------------- HPI Details Patient Name: Date of Service: Destiny [name]. 09/03/2022 10:30 A M Medical Record Number: [id number] Patient Account Number: [id number] Date of Birth/Sex: Treating RN: 1945-12-20 (76 y.o. 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 After the final reexcision, the wound had an ACell graft applied. She reports that she had good closure of skin over the site. Earlier this year, at the end of May, however, she was diagnosed with rectal cancer and is currently undergoing neoadjuvant therapy (chemotherapy and radiation) in anticipation of future surgery. She reports that about 2 weeks ago, she noted that her skin coverage over the heel was beginning to break down. Her oncologist felt that perhaps the Xeloda was contributing to wound failure and stopped this medication. She continues to receive neoadjuvant radiation therapy, however. She saw plastic surgery last week, and they referred her to  the wound care center for further evaluation and management. She is a type II diabetic, well controlled, with the most recent A1c available to me being 5.9%. ABIs done 1 year ago were normal. On the patient's left heel, there is an irregular wound with some skin maceration. The fat layer is exposed and there is slough over the entire surface. Outside of the area of maceration, the skin is in good condition. No erythema, induration, or odor. No  concern for infection. 05/26/2022: The wound is smaller today and is extremely clean, without any slough buildup. The periwound skin is in much better condition and is no longer macerated. She is scheduled to complete her radiation treatment on Wednesday. 06/03/2022: The wound continues to contract. There is just a little bit of eschar and callus accumulation. The surface is clean. She has completed all of her neoadjuvant therapy and is awaiting surgery in November. 06/10/2022: The wound is smaller by about a centimeter in all dimensions. She does have a little bit of eschar near the Achilles portion of the wound. The wound surface itself is robust and healthy-appearing. 06/24/2022: The wound continues to contract. There is just a small portion open in the center. She does have some dry skin and periwound callus circumferentially. No concern for infection. 07/08/2022: The wound is down to just a pinhole. There is a little bit of eschar on the surface. No concern for infection. 07/22/2022: Her wound is healed. 08/08/2022: Unfortunately, it seems that when her wound was being dressed at her last visit, the nurse applying the dressing pulled some dead skin off and reopened to the wound. I am not sure why this was not brought to my attention, but the patient has been caring for the wound at home with her supplies from her previous admission. She has been wearing her heel off loader and using Hydrofera Blue. There is some eschar accumulation, but no erythema,  induration, or other concern for infection. Her colorectal surgery is scheduled for next Wednesday. 09/03/2022: [name] returns after undergoing her colon surgery. She has been dressing the wound at home with [facility], but has not been wearing her heel cup. The wound is more open today with a lot of periwound callus, dry skin, and a light layer of slough. No overt signs of infection. Electronic Signature(s) Signed: 09/03/2022 11:27:52 AM By: Fredirick Maudlin MD FACS Entered By: Fredirick Maudlin on 09/03/2022 11:27:52 Physical Exam Details -------------------------------------------------------------------------------- [name] ([id number]) [id number] Page 3 of 9 Patient Name: Date of Service: CIONNA, COLLANTES 09/03/2022 10:30 A M Medical Record Number: [id number] Patient Account Number: [id number] Date of Birth/Sex: Treating RN: Nov 20, 1945 (76 y.o. [name]) Primary Care Provider: Deland Pretty Other Clinician: Referring Provider: Treating Provider/Extender: Raylene Everts in Treatment: 15 Constitutional . . . . No acute distress. Respiratory Normal work of breathing on room air. Notes 09/03/2022: The wound is more open today with a lot of periwound callus, dry skin, and a light layer of slough. No overt signs of infection. Electronic Signature(s) Signed: 09/03/2022 11:28:33 AM By: Fredirick Maudlin MD FACS Entered By: Fredirick Maudlin on 09/03/2022 11:28:32 -------------------------------------------------------------------------------- Physician Orders Details Patient Name: Date of Service: Destiny [name]. 09/03/2022 10:30 A M Medical Record Number: [id number] Patient Account Number: [id number] Date of Birth/Sex: Treating RN: 07-08-1946 (76 y.o. Elam Dutch Primary Care Provider: Deland Pretty Other Clinician: Referring Provider: Treating Provider/Extender: Raylene Everts in  Treatment: 15 Verbal / Phone Orders: No Diagnosis Coding ICD-10 Coding Code Description [phone] Non-pressure chronic ulcer of left heel and midfoot with fat layer exposed T81.31XS Disruption of external operation (surgical) wound, not elsewhere classified, sequela C43.9 Malignant melanoma of skin, unspecified C20 Malignant neoplasm of rectum E11.9 Type 2 diabetes mellitus without complications J18.84 Personal history of antineoplastic chemotherapy Z92.3 Personal history of irradiation Follow-up Appointments ppointment in 2 weeks. - Dr. Celine Ahr RM 1 Return A Anesthetic Wound #1 Left Calcaneus (In clinic) Topical Lidocaine 4%  applied to wound bed Bathing/ Shower/ Hygiene Other Bathing/Shower/Hygiene Orders/Instructions: - Change wound dressing after bathing Edema Control - Lymphedema / SCD / Other Avoid standing for long periods of time. Moisturize legs daily. Off-Loading Other: - Please wear off-loading shoe on left foot to keep pressure off the heel Wound Treatment Wound #1 - Calcaneus Wound Laterality: Left Cleanser: Soap and Water 1 x Per Day/30 Days Discharge Instructions: May shower and wash wound with dial antibacterial soap and water prior to dressing change. MILTA, CROSON ([id number]) [id number] Page 4 of 9 Cleanser: Wound Cleanser 1 x Per Day/30 Days Discharge Instructions: Cleanse the wound with wound cleanser prior to applying a clean dressing using gauze sponges, not tissue or cotton balls. Prim Dressing: SILVERCEL Alginate Dressing, 2x2 (in/in) (DME) (Dispense As Written) 1 x Per Day/30 Days ary Discharge Instructions: apply to wound bed Secondary Dressing: ALLEVYN Heel 4 1/2in x 5 1/2in / 10.5cm x 13.5cm 1 x Per Day/30 Days Discharge Instructions: Apply over primary dressing as directed. Secondary Dressing: Woven Gauze Sponge, Non-Sterile 4x4 in 1 x Per Day/30 Days Discharge Instructions: Apply over primary dressing as  directed. Secured With: Elastic Bandage 4 inch (ACE bandage) 1 x Per Day/30 Days Discharge Instructions: Secure with ACE bandage as directed. Secured With: [employer], 4.5x3.1 (in/yd) 1 x Per Day/30 Days Discharge Instructions: Secure with Kerlix as directed. Secured With: 25M Medipore Public affairs consultant Surgical T 2x10 (in/yd) (Generic) 1 x Per Day/30 Days ape Discharge Instructions: Secure with tape as directed. Electronic Signature(s) Signed: 09/03/2022 12:12:36 PM By: Fredirick Maudlin MD FACS Entered By: Fredirick Maudlin on 09/03/2022 11:32:52 -------------------------------------------------------------------------------- Problem List Details Patient Name: Date of Service: Destiny [name]. 09/03/2022 10:30 A M Medical Record Number: [id number] Patient Account Number: [id number] Date of Birth/Sex: Treating RN: December 26, 1945 (76 y.o. Elam Dutch Primary Care Provider: Deland Pretty Other Clinician: Referring Provider: Treating Provider/Extender: Raylene Everts in Treatment: 15 Active Problems ICD-10 Encounter Code Description Active Date MDM Diagnosis [phone] Non-pressure chronic ulcer of left heel and midfoot with fat layer exposed 05/19/2022 No Yes T81.31XS Disruption of external operation (surgical) wound, not elsewhere classified, 05/19/2022 No Yes sequela C43.9 Malignant melanoma of skin, unspecified 05/19/2022 No Yes C20 Malignant neoplasm of rectum 05/19/2022 No Yes E11.9 Type 2 diabetes mellitus without complications 04/17/1951 No Yes Z92.21 Personal history of antineoplastic chemotherapy 05/19/2022 No Yes Z92.3 Personal history of irradiation 05/19/2022 No Yes ANALYSE, ANGST ([id number]) [id number] Page 5 of 9 Inactive Problems Resolved Problems Electronic Signature(s) Signed: 09/03/2022 11:26:44 AM By: Fredirick Maudlin MD FACS Entered By: Fredirick Maudlin on 09/03/2022  11:26:44 -------------------------------------------------------------------------------- Progress Note Details Patient Name: Date of Service: Destiny [name]. 09/03/2022 10:30 A M Medical Record Number: [id number] Patient Account Number: [id number] Date of Birth/Sex: Treating RN: [birth year] (76 y.o. 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 After the final reexcision, the wound had an ACell graft applied. She reports that she had good closure of skin over the site. Earlier this year, at the end of May, however, she was diagnosed with rectal cancer and is currently undergoing neoadjuvant therapy (chemotherapy and radiation) in anticipation of future surgery. She reports that  about 2 weeks ago, she noted that her skin coverage over the heel was beginning to break down. Her oncologist felt that perhaps the Xeloda was contributing to wound failure and stopped this medication. She continues to receive neoadjuvant radiation therapy, however. She saw plastic surgery last week, and they referred her to the wound care center for further evaluation and management. She is a type II diabetic, well controlled, with the most recent A1c available to me being 5.9%. ABIs done 1 year ago were normal. On the patient's left heel, there is an irregular wound with some skin maceration. The fat layer is exposed and there is slough over the entire surface. Outside of the area of maceration, the skin is in good condition. No erythema, induration, or odor. No concern for infection. 05/26/2022:  The wound is smaller today and is extremely clean, without any slough buildup. The periwound skin is in much better condition and is no longer macerated. She is scheduled to complete her radiation treatment on Wednesday. 06/03/2022: The wound continues to contract. There is just a little bit of eschar and callus accumulation. The surface is clean. She has completed all of her neoadjuvant therapy and is awaiting surgery in November. 06/10/2022: The wound is smaller by about a centimeter in all dimensions. She does have a little bit of eschar near the Achilles portion of the wound. The wound surface itself is robust and healthy-appearing. 06/24/2022: The wound continues to contract. There is just a small portion open in the center. She does have some dry skin and periwound callus circumferentially. No concern for infection. 07/08/2022: The wound is down to just a pinhole. There is a little bit of eschar on the surface. No concern for infection. 07/22/2022: Her wound is healed. 08/08/2022: Unfortunately, it seems that when her wound was being dressed at her last visit, the nurse applying the dressing pulled some dead skin off and reopened to the wound. I am not sure why this was not brought to my attention, but the patient has been caring for the wound at home with her supplies from her previous admission. She has been wearing her heel off loader and using Hydrofera Blue. There is some eschar accumulation, but no erythema, induration, or other concern for infection. Her colorectal surgery is scheduled for next Wednesday. 09/03/2022: Ms. Hibbitts returns after undergoing her colon surgery. She has been dressing the wound at home with [facility], but has not been wearing her heel cup. The wound is more open today with a lot of periwound callus, dry skin, and a light layer of slough. No overt signs of infection. Patient History Information obtained from Patient. Social History Former smoker - 1991,  Marital Status - Divorced, Alcohol Use - Never, Drug Use - No History, Caffeine Use - Moderate - Diet coke,coffee. Medical History Endocrine Patient has history of Type II Diabetes - Metformin pillsand Ozempic Musculoskeletal RAIANNA, SLIGHT ([id number]) [id number] Page 6 of 9 Patient has history of Osteoarthritis Hospitalization/Surgery History - Melanoma excision to left heel;ORIF Left ankle fracture;[facility] Kitchen - rectal surgery. Medical A Surgical History Notes nd Eyes Macular Degeneration Ear/Nose/Mouth/Throat Tinnitus Oncologic 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 has had Chemotherapy (stopped) and Radiation -8 treatments left as of 05/19/22 Rectal cancer Psychiatric Hx Depression Objective Constitutional No acute distress. Vitals Time Taken: 10:41 AM, Height: 63 in, Temperature: 98.5 [name], Pulse: 82 bpm, Respiratory Rate: 18 breaths/min, Blood Pressure: 134/85 mmHg. General Notes: does not check blood sugars daily Respiratory Normal work of breathing on room air.  General Notes: 09/03/2022: The wound is more open today with a lot of periwound callus, dry skin, and a light layer of slough. No overt signs of infection. Integumentary (Hair, Skin) Wound #1 status is Open. Original cause of wound was Hematoma. The date acquired was: 05/05/2022. The wound has been in treatment 15 weeks. The wound is located on the Left Calcaneus. The wound measures 0.9cm length x 0.9cm width x 0.1cm depth; 0.636cm^2 area and 0.064cm^3 volume. There is Fat Layer (Subcutaneous Tissue) exposed. There is no tunneling or undermining noted. There is a medium amount of sanguinous drainage noted. The wound margin is flat and intact. There is large (67-100%) red, pink granulation within the wound bed. There is a small (1-33%) amount of necrotic tissue within the wound bed including Adherent Slough. The periwound skin appearance had no abnormalities noted for color. The periwound skin appearance exhibited:  Callus, Dry/Scaly. The periwound skin appearance did not exhibit: Maceration. Periwound temperature was noted as No Abnormality. Assessment Active Problems ICD-10 Non-pressure chronic ulcer of left heel and midfoot with fat layer exposed Disruption of external operation (surgical) wound, not elsewhere classified, sequela Malignant melanoma of skin, unspecified Malignant neoplasm of rectum Type 2 diabetes mellitus without complications Personal history of antineoplastic chemotherapy Personal history of irradiation Procedures Wound #1 Pre-procedure diagnosis of Wound #1 is a Trauma, Other located on the Left Calcaneus . There was a Selective/Open Wound Skin/Epidermis Debridement with a total area of 9.9 sq cm performed by Fredirick Maudlin, MD. With the following instrument(s): Curette to remove Non-Viable tissue/material. Material removed includes Callus, Slough, and Skin: Epidermis after achieving pain control using Lidocaine 4% T opical Solution. No specimens were taken. A time out was conducted at 11:00, prior to the start of the procedure. A Minimum amount of bleeding was controlled with Pressure. The procedure was tolerated well with a pain level of 0 throughout and a pain level of 0 following the procedure. Post Debridement Measurements: 3.3cm length x 1.5cm width x 0.1cm depth; 0.389cm^3 volume. Character of Wound/Ulcer Post Debridement is improved. Post procedure Diagnosis Wound #1: Same as Pre-Procedure General Notes: scribed by Baruch Gouty, RN for Dr. Celine Ahr. Plan QUINITA, KOSTELECKY ([id number]) [id number] Page 7 of 9 Follow-up Appointments: Return Appointment in 2 weeks. - Dr. Celine Ahr RM 1 Anesthetic: Wound #1 Left Calcaneus: (In clinic) Topical Lidocaine 4% applied to wound bed Bathing/ Shower/ Hygiene: Other Bathing/Shower/Hygiene Orders/Instructions: - Change wound dressing after bathing Edema Control - Lymphedema / SCD / Other: Avoid standing  for long periods of time. Moisturize legs daily. Off-Loading: Other: - Please wear off-loading shoe on left foot to keep pressure off the heel WOUND #1: - Calcaneus Wound Laterality: Left Cleanser: Soap and Water 1 x Per Day/30 Days Discharge Instructions: May shower and wash wound with dial antibacterial soap and water prior to dressing change. Cleanser: Wound Cleanser 1 x Per Day/30 Days Discharge Instructions: Cleanse the wound with wound cleanser prior to applying a clean dressing using gauze sponges, not tissue or cotton balls. Prim Dressing: SILVERCEL Alginate Dressing, 2x2 (in/in) (DME) (Dispense As Written) 1 x Per Day/30 Days ary Discharge Instructions: apply to wound bed Secondary Dressing: ALLEVYN Heel 4 1/2in x 5 1/2in / 10.5cm x 13.5cm 1 x Per Day/30 Days Discharge Instructions: Apply over primary dressing as directed. Secondary Dressing: Woven Gauze Sponge, Non-Sterile 4x4 in 1 x Per Day/30 Days Discharge Instructions: Apply over primary dressing as directed. Secured With: Elastic Bandage 4 inch (ACE bandage) 1 x Per Day/30 Days Discharge  Instructions: Secure with ACE bandage as directed. Secured With: [employer], 4.5x3.1 (in/yd) 1 x Per Day/30 Days Discharge Instructions: Secure with Kerlix as directed. Secured With: 45M Medipore Public affairs consultant Surgical T 2x10 (in/yd) (Generic) 1 x Per Day/30 Days ape Discharge Instructions: Secure with tape as directed. 09/03/2022: The wound is more open today with a lot of periwound callus, dry skin, and a light layer of slough. No overt signs of infection. I used a curette to debride slough, callus, and dry skin from the wound. I am going to change her dressing to silver alginate and reminded her of the importance of padding her heel with the heel cup. She will continue to wear her heel offloading shoe. Follow-up in 2 weeks. Electronic Signature(s) Signed: 09/03/2022 11:34:28 AM By: Fredirick Maudlin MD FACS Entered By: Fredirick Maudlin on 09/03/2022 11:34:28 -------------------------------------------------------------------------------- HxROS Details Patient Name: Date of Service: Destiny [name]. 09/03/2022 10:30 A M Medical Record Number: [id number] Patient Account Number: [id number] Date of Birth/Sex: Treating RN: 09/17/1946 (76 y.o. Elam Dutch Primary Care Provider: Deland Pretty Other Clinician: Referring Provider: Treating Provider/Extender: Raylene Everts in Treatment: 15 Information Obtained From Patient Eyes Medical History: Past Medical History Notes: Macular Degeneration Ear/Nose/Mouth/Throat Medical History: Past Medical History Notes: Tinnitus Endocrine Medical History: Positive for: Type II Diabetes - Metformin pillsand Ozempic Time with diabetes: 25 years Treated with: Oral agents ARAMINTA, ZORN ([id number]) [id number] Page 8 of 9 Blood sugar tested every day: Yes Tested : every day Musculoskeletal Medical History: Positive for: Osteoarthritis Oncologic Medical History: Past Medical History Notes: 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 has had Chemotherapy (stopped) and Radiation -8 treatments left as of 05/19/22 Rectal cancer Psychiatric Medical History: Past Medical History Notes: Hx Depression Immunizations Pneumococcal Vaccine: Received Pneumococcal Vaccination: Yes Received Pneumococcal Vaccination On or After 60th Birthday: Yes Implantable Devices None Hospitalization / Surgery History Type of Hospitalization/Surgery Melanoma excision to left heel;ORIF Left ankle fracture; rectal surgery Family and Social History Former smoker - 1991; Marital Status - Divorced; Alcohol Use: Never; Drug Use: No History; Caffeine Use: Moderate - Diet coke,coffee; Financial Concerns: No; Food, Clothing or Shelter Needs: No; Support System Lacking: No; Transportation Concerns: No Electronic Signature(s) Signed: 09/03/2022 12:12:36 PM By:  Fredirick Maudlin MD FACS Signed: 09/03/2022 3:43:48 PM By: Baruch Gouty RN, BSN Entered By: Fredirick Maudlin on 09/03/2022 11:27:57 -------------------------------------------------------------------------------- SuperBill Details Patient Name: Date of Service: [name] 09/03/2022 Medical Record Number: [id number] Patient Account Number: [id number] Date of Birth/Sex: Treating RN: Aug 23, 1946 (76 y.o. [name]) Primary Care Provider: Deland Pretty Other Clinician: Referring Provider: Treating Provider/Extender: Raylene Everts in Treatment: 15 Diagnosis Coding ICD-10 Codes Code Description [phone] Non-pressure chronic ulcer of left heel and midfoot with fat layer exposed T81.31XS Disruption of external operation (surgical) wound, not elsewhere classified, sequela C43.9 Malignant melanoma of skin, unspecified C20 Malignant neoplasm of rectum E11.9 Type 2 diabetes mellitus without complications Z66.06 Personal history of antineoplastic chemotherapy Z92.3 Personal history of irradiation HELAINA, STEFANO ([id number]) [id number] Page 9 of 9 Facility Procedures : CPT4 Code: [id number] Description: [phone] - DEBRIDE WOUND 1ST 20 SQ CM OR < ICD-10 Diagnosis Description L97.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 Modifier: Quantity: 1 Physician Procedures : CPT4 Code Description Modifier [id number] - WC PHYS LEVEL 3 - EST PT 25 ICD-10 Diagnosis Description L97.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 T81.31XS Disruption of external operation (surgical) wound,  not  elsewhere classified, sequela C43.9 Malignant melanoma of skin, unspecified Z92.21 Personal history of antineoplastic chemotherapy Quantity: 1 : [id number] - WC PHYS DEBR WO ANESTH 20 SQ CM ICD-10 Diagnosis Description L97.422 Non-pressure chronic ulcer of left heel and midfoot with fat layer  exposed Quantity: 1 Electronic Signature(s) Signed: 09/03/2022 11:34:47 AM By: Fredirick Maudlin MD FACS Entered By: Fredirick Maudlin on 09/03/2022 11:34:47

## 2022-09-03 NOTE — Progress Notes (Signed)
Destiny, [name] ([id number]) [id number] Page 1 of 7 Visit Report for 09/03/2022 Arrival Information Details Patient Name: Date of Service: Destiny [name], Destiny [name] 09/03/2022 10:30 A M Medical Record Number: [id number] Patient Account Number: [id number] Date of Birth/Sex: Treating RN: [birth year] (76 y.o. Elam Dutch Primary Care Sritha Chauncey: Deland Pretty Other Clinician: Referring Eileene Kisling: Treating Assad Harbeson/Extender: Raylene Everts in Treatment: 15 Visit Information History Since Last Visit Added or deleted any medications: No Patient Arrived: Ambulatory Any new allergies or adverse reactions: No Arrival Time: 10:33 Had a fall or experienced change in No Accompanied By: self activities of daily living that may affect Transfer Assistance: None risk of falls: Patient Identification Verified: Yes Signs or symptoms of abuse/neglect since last visito No Secondary Verification Process Completed: Yes Hospitalized since last visit: Yes Patient Requires Transmission-Based Precautions: No Implantable device outside of the clinic excluding No Patient Has Alerts: No cellular tissue based products placed in the center since last visit: Has Dressing in Place as Prescribed: Yes Pain Present Now: No Electronic Signature(s) Signed: 09/03/2022 3:43:48 PM By: Baruch Gouty RN, BSN Entered By: Baruch Gouty on 09/03/2022 10:38:16 -------------------------------------------------------------------------------- Encounter Discharge Information Details Patient Name: Date of Service: Destiny Dark F. 09/03/2022 10:30 A M Medical Record Number: [id number] Patient Account Number: [id number] Date of Birth/Sex: Treating RN: [birth year] (76 y.o. Elam Dutch Primary Care Kataleah Bejar: Deland Pretty Other Clinician: Referring Anwar Crill: Treating Makyia Erxleben/Extender: Raylene Everts in Treatment: 15 Encounter Discharge  Information Items Post Procedure Vitals Discharge Condition: Stable Temperature (F): 98.5 Ambulatory Status: Ambulatory Pulse (bpm): 82 Discharge Destination: Home Respiratory Rate (breaths/min): 18 Transportation: Private Auto Blood Pressure (mmHg): 134/85 Accompanied By: self Schedule Follow-up Appointment: Yes Clinical Summary of Care: Patient Declined Electronic Signature(s) Signed: 09/03/2022 3:43:48 PM By: Baruch Gouty RN, BSN Entered By: Baruch Gouty on 09/03/2022 11:23:41 Cowans, Lurene Shadow ([id number]) [id number] Page 2 of 7 -------------------------------------------------------------------------------- Lower Extremity Assessment Details Patient Name: Date of Service: Destiny [name], Destiny [name] 09/03/2022 10:30 A M Medical Record Number: [id number] Patient Account Number: [id number] Date of Birth/Sex: Treating RN: 1946-03-12 (76 y.o. Elam Dutch Primary Care Miciah Shealy: Deland Pretty Other Clinician: Referring Surena Welge: Treating Shaman Muscarella/Extender: Raylene Everts in Treatment: 15 Edema Assessment Assessed: [name]: No] Patrice Paradise: No] Edema: [Left: N] [Right: o] Calf Left: Right: Point of Measurement: 29 cm From Medial Instep 34.1 cm Ankle Left: Right: Point of Measurement: 10 cm From Medial Instep 21 cm Vascular Assessment Pulses: Dorsalis Pedis Palpable: [Left:Yes] Electronic Signature(s) Signed: 09/03/2022 3:43:48 PM By: Baruch Gouty RN, BSN Entered By: Baruch Gouty on 09/03/2022 10:46:08 -------------------------------------------------------------------------------- Multi Wound Chart Details Patient Name: Date of Service: Destiny Dark F. 09/03/2022 10:30 A M Medical Record Number: [id number] Patient Account Number: [id number] Date of Birth/Sex: Treating RN: 1946-09-05 (76 y.o. F) Primary Care Jguadalupe Opiela: Deland Pretty Other Clinician: Referring Javious Hallisey: Treating Marthe Dant/Extender: Raylene Everts in Treatment: 15 Vital Signs Height(in): 63 Pulse(bpm): 84 Weight(lbs): Blood Pressure(mmHg): 134/85 Body Mass Index(BMI): Temperature(F): 98.5 Respiratory Rate(breaths/min): 18 [1:Photos: No Photos Left Calcaneus Wound Location: Hematoma Wounding Event: Trauma, Other Primary Etiology: Type II Diabetes, Osteoarthritis Comorbid History: 05/05/2022 Date Acquired: 15 Weeks of Treatment: Open Wound Status: No Wound Recurrence:] [N/A:N/A N/A N/A  N/A N/A N/A N/A N/A N/A] Destiny, [name] ([id number]) [1:0.9x0.9x0.1 Measurements L x W x D (cm) 0.636 A (cm) : rea 0.064 Volume (cm) : 90.70% % Reduction in Area: 95.30% % Reduction in Volume: Full Thickness Without Exposed Classification: Support Structures Medium  Exudate A mount: Sanguinous Exudate  Type: red Exudate Color: Flat and Intact Wound Margin: Large (67-100%) Granulation A mount: Red, Pink Granulation Quality: Small (1-33%) Necrotic A mount: Fat Layer (Subcutaneous Tissue): Yes N/A Exposed Structures: Fascia: No Tendon: No Muscle: No  Joint: No Bone: No Small (1-33%) Epithelialization: Debridement - Selective/Open Wound N/A Debridement: Pre-procedure Verification/Time Out 11:00 Taken: Lidocaine 4% T opical Solution Pain Control: Callus, Slough Tissue Debrided: Skin/Epidermis Level:  9.9 Debridement A (sq cm): rea Curette Instrument: Minimum Bleeding: Pressure Hemostasis A chieved: 0 Procedural Pain: 0 Post Procedural Pain: Procedure was tolerated well Debridement Treatment Response: 3.3x1.5x0.1 Post Debridement Measurements L x W x  D (cm) 0.389 Post Debridement Volume: (cm) Callus: Yes Periwound Skin Texture: Dry/Scaly: Yes Periwound Skin Moisture: Maceration: No No Abnormalities Noted Periwound Skin Color: No Abnormality Temperature: Debridement Procedures Performed:] [N/A:N/A  N/A N/A N/A N/A N/A N/A N/A N/A N/A N/A N/A N/A N/A N/A N/A N/A N/A N/A N/A N/A N/A N/A N/A N/A N/A N/A N/A N/A N/A N/A N/A] Treatment  Notes Wound #1 (Calcaneus) Wound Laterality: Left Cleanser Soap and Water Discharge Instruction: May shower and wash wound with dial antibacterial soap and water prior to dressing change. Wound Cleanser Discharge Instruction: Cleanse the wound with wound cleanser prior to applying a clean dressing using gauze sponges, not tissue or cotton balls. Peri-Wound Care Topical Primary Dressing SILVERCEL Alginate Dressing, 2x2 (in/in) Discharge Instruction: apply to wound bed Secondary Dressing ALLEVYN Heel 4 1/2in x 5 1/2in / 10.5cm x 13.5cm Discharge Instruction: Apply over primary dressing as directed. Woven Gauze Sponge, Non-Sterile 4x4 in Discharge Instruction: Apply over primary dressing as directed. Secured With Elastic Bandage 4 inch (ACE bandage) Discharge Instruction: Secure with ACE bandage as directed. Kerlix Roll Sterile, 4.5x3.1 (in/yd) Discharge Instruction: Secure with Kerlix as directed. 27M Medipore Soft Cloth Surgical T 2x10 (in/yd) ape Discharge Instruction: Secure with tape as directed. Compression Wrap Compression Stockings JAKYLAH, [name] ([id number]) [id number] Page 4 of 7 Add-Ons Electronic Signature(s) Signed: 09/03/2022 11:26:51 AM By: Fredirick Maudlin MD FACS Entered By: Fredirick Maudlin on 09/03/2022 11:26:51 -------------------------------------------------------------------------------- Multi-Disciplinary Care Plan Details Patient Name: Date of Service: Destiny Dark F. 09/03/2022 10:30 A M Medical Record Number: [id number] Patient Account Number: [id number] Date of Birth/Sex: Treating RN: 1946/03/11 (76 y.o. Elam Dutch Primary Care Dearra Myhand: Deland Pretty Other Clinician: Referring Kyarra Vancamp: Treating Erienne Spelman/Extender: Raylene Everts in Treatment: [place] reviewed with physician Active Inactive Necrotic Tissue Nursing Diagnoses: Impaired tissue integrity related to  necrotic/devitalized tissue Knowledge deficit related to management of necrotic/devitalized tissue Goals: Necrotic/devitalized tissue will be minimized in the wound bed Date Initiated: 08/08/2022 Target Resolution Date: 09/19/2022 Goal Status: Active Patient/caregiver will verbalize understanding of reason and process for debridement of necrotic tissue Date Initiated: 08/08/2022 Target Resolution Date: 09/19/2022 Goal Status: Active Interventions: Assess patient pain level pre-, during and post procedure and prior to discharge Provide education on necrotic tissue and debridement process Treatment Activities: Apply topical anesthetic as ordered : 08/08/2022 Notes: Wound/Skin Impairment Nursing Diagnoses: Impaired tissue integrity Goals: Patient/caregiver will verbalize understanding of skin care regimen Date Initiated: 09/03/2022 Target Resolution Date: 10/01/2022 Goal Status: Active Interventions: Assess ulceration(s) every visit Treatment Activities: Skin care regimen initiated : 05/19/2022 Notes: Electronic Signature(s) Signed: 09/03/2022 3:43:48 PM By: Baruch Gouty RN, BSN Entered By: Baruch Gouty on 09/03/2022 10:50:26 Destiny [name] ([id number]) [id number] Page 5 of 7 -------------------------------------------------------------------------------- Pain Assessment Details Patient Name: Date of Service: Destiny [name], BARTOLINI. 09/03/2022 10:30 A  M Medical Record Number: [id number] Patient Account Number: [id number] Date of Birth/Sex: Treating RN: 02-13-1946 (76 y.o. Elam Dutch Primary Care Kahmari Koller: Deland Pretty Other Clinician: Referring Hollyann Pablo: Treating Delancey Moraes/Extender: Raylene Everts in Treatment: 15 Active Problems Location of Pain Severity and Description of Pain Patient Has Paino No Site Locations Rate the pain. Current Pain Level: 0 Pain Management and Medication Current Pain  Management: Electronic Signature(s) Signed: 09/03/2022 3:43:48 PM By: Baruch Gouty RN, BSN Entered By: Baruch Gouty on 09/03/2022 10:42:48 -------------------------------------------------------------------------------- Patient/Caregiver Education Details Patient Name: Date of Service: Destiny [name] 11/22/2023andnbsp10:30 A M Medical Record Number: [id number] Patient Account Number: [id number] Date of Birth/Gender: Treating RN: January 28, 1946 (76 y.o. Elam Dutch Primary Care Physician: Deland Pretty Other Clinician: Referring Physician: Treating Physician/Extender: Raylene Everts in Treatment: 15 Education Assessment Education Provided To: Patient Education Topics Provided Wound Debridement: Methods: Explain/Verbal Responses: Reinforcements needed, State content correctly CELSEY, ASSELIN ([id number]) [id number] Page 6 of 7 Electronic Signature(s) Signed: 09/03/2022 3:43:48 PM By: Baruch Gouty RN, BSN Entered By: Baruch Gouty on 09/03/2022 10:50:51 -------------------------------------------------------------------------------- Wound Assessment Details Patient Name: Date of Service: Destiny Dark F. 09/03/2022 10:30 A M Medical Record Number: [id number] Patient Account Number: [id number] Date of Birth/Sex: Treating RN: July 16, 1946 (76 y.o. Elam Dutch Primary Care Shaily Librizzi: Deland Pretty Other Clinician: Referring Shakeila Pfarr: Treating Takisha Pelle/Extender: Raylene Everts in Treatment: 15 Wound Status Wound Number: 1 Primary Etiology: Trauma, Other Wound Location: Left Calcaneus Wound Status: Open Wounding Event: Hematoma Comorbid History: Type II Diabetes, Osteoarthritis Date Acquired: 05/05/2022 Weeks Of Treatment: 15 Clustered Wound: No Wound Measurements Length: (cm) 0.9 Width: (cm) 0.9 Depth: (cm) 0.1 Area: (cm) 0.636 Volume: (cm) 0.064 % Reduction in Area: 90.7% %  Reduction in Volume: 95.3% Epithelialization: Small (1-33%) Tunneling: No Undermining: No Wound Description Classification: Full Thickness Without Exposed Support Structures Wound Margin: Flat and Intact Exudate Amount: Medium Exudate Type: Sanguinous Exudate Color: red Foul Odor After Cleansing: No Slough/Fibrino No Wound Bed Granulation Amount: Large (67-100%) Exposed Structure Granulation Quality: Red, Pink Fascia Exposed: No Necrotic Amount: Small (1-33%) Fat Layer (Subcutaneous Tissue) Exposed: Yes Necrotic Quality: Adherent Slough Tendon Exposed: No Muscle Exposed: No Joint Exposed: No Bone Exposed: No Periwound Skin Texture Texture Color No Abnormalities Noted: No No Abnormalities Noted: Yes Callus: Yes Temperature / Pain Temperature: No Abnormality Moisture No Abnormalities Noted: No Dry / Scaly: Yes Maceration: No Treatment Notes Wound #1 (Calcaneus) Wound Laterality: Left Cleanser Soap and Water Discharge Instruction: May shower and wash wound with dial antibacterial soap and water prior to dressing change. Wound Cleanser Discharge Instruction: Cleanse the wound with wound cleanser prior to applying a clean dressing using gauze sponges, not tissue or cotton balls. EASTON, SIVERTSON ([id number]) [id number] Page 7 of 7 Peri-Wound Care Topical Primary Dressing SILVERCEL Alginate Dressing, 2x2 (in/in) Discharge Instruction: apply to wound bed Secondary Dressing ALLEVYN Heel 4 1/2in x 5 1/2in / 10.5cm x 13.5cm Discharge Instruction: Apply over primary dressing as directed. Woven Gauze Sponge, Non-Sterile 4x4 in Discharge Instruction: Apply over primary dressing as directed. Secured With Elastic Bandage 4 inch (ACE bandage) Discharge Instruction: Secure with ACE bandage as directed. Kerlix Roll Sterile, 4.5x3.1 (in/yd) Discharge Instruction: Secure with Kerlix as directed. 49M Medipore Soft Cloth Surgical T 2x10  (in/yd) ape Discharge Instruction: Secure with tape as directed. Compression Wrap Compression Stockings Add-Ons Electronic Signature(s) Signed: 09/03/2022 3:43:48 PM By: Baruch Gouty RN, BSN Entered By: Baruch Gouty on 09/03/2022 10:47:58 -------------------------------------------------------------------------------- Vitals Details  Patient Name: Date of Service: Destiny [name], Destiny [name] 09/03/2022 10:30 A M Medical Record Number: [id number] Patient Account Number: [id number] Date of Birth/Sex: Treating RN: 11/05/1945 (76 y.o. Elam Dutch Primary Care Derrich Gaby: Deland Pretty Other Clinician: Referring Mechel Schutter: Treating Redonna Wilbert/Extender: Raylene Everts in Treatment: 15 Vital Signs Time Taken: 10:41 Temperature (F): 98.5 Height (in): 63 Pulse (bpm): 82 Respiratory Rate (breaths/min): 18 Blood Pressure (mmHg): 134/85 Reference Range: 80 - 120 mg / dl Notes does not check blood sugars daily Electronic Signature(s) Signed: 09/03/2022 3:43:48 PM By: Baruch Gouty RN, BSN Entered By: Baruch Gouty on 09/03/2022 10:42:41

## 2022-09-08 DIAGNOSIS — L97412 Non-pressure chronic ulcer of right heel and midfoot with fat layer exposed: Secondary | ICD-10-CM | POA: Diagnosis not present

## 2022-09-11 ENCOUNTER — Ambulatory Visit: Payer: PPO | Attending: Oncology | Admitting: Physical Therapy

## 2022-09-11 ENCOUNTER — Other Ambulatory Visit: Payer: Self-pay

## 2022-09-11 DIAGNOSIS — M6281 Muscle weakness (generalized): Secondary | ICD-10-CM

## 2022-09-11 DIAGNOSIS — R293 Abnormal posture: Secondary | ICD-10-CM

## 2022-09-11 DIAGNOSIS — C20 Malignant neoplasm of rectum: Secondary | ICD-10-CM | POA: Diagnosis not present

## 2022-09-11 DIAGNOSIS — R279 Unspecified lack of coordination: Secondary | ICD-10-CM

## 2022-09-11 NOTE — Therapy (Signed)
OUTPATIENT PHYSICAL THERAPY FEMALE PELVIC EVALUATION   Patient Name: Destiny [name] MRN: [id number] DOB:1945/11/03, 76 y.o., female Today's Date: 09/11/2022  END OF SESSION:  PT End of Session - 09/11/22 1430     Visit Number 1    Date for PT Re-Evaluation 12/11/22    Authorization Type [employer] PPO    PT Start Time 1447    PT Stop Time 1528    PT Time Calculation (min) 41 min    Activity Tolerance Patient tolerated treatment well    Behavior During Therapy WFL for tasks assessed/performed             Past Medical History:  Diagnosis Date   Allergy    Ankle dislocation, left, initial encounter 09/23/2017   Arthritis    Blood transfusion    in [place] ([place])    left heel melanoma   Diabetes mellitus    History of kidney stones    Hypothyroidism    Osteoporosis    osteopenia   PONV (postoperative nausea and vomiting)    Sleep apnea    does not use cpap   Thyroid disease    hypothyroidism   Past Surgical History:  Procedure Laterality Date   APPLICATION OF A-CELL OF CHEST/ABDOMEN Left 04/24/2021   Procedure: APPLICATION OF A-CELL LEFT FOOT;  Surgeon: Wallace Going, DO;  Location: [place];  Service: Plastics;  Laterality: Left;   APPLICATION OF A-CELL OF CHEST/ABDOMEN Left 05/22/2021   Procedure: POSSIBLE APPLICATION OF A-CELL;  Surgeon: Wallace Going, DO;  Location: [facility];  Service: Plastics;  Laterality: Left;   COLONOSCOPY     EXCISION MELANOMA WITH SENTINEL LYMPH NODE BIOPSY Left 03/26/2021   Procedure: WIDE LOCAL EXCISION MELANOMA LEFT HEEL, SENTINEL LYMPH NODE MAPPING AND BIOPSY;  Surgeon: [name], MD;  Location: [facility];  Service: General;  Laterality: Left;   EXTERNAL FIXATION LEG Left 09/24/2017   Procedure: EXTERNAL FIXATION ankle;  Surgeon: Shona Needles, MD;  Location: [facility];  Service: Orthopedics;  Laterality: Left;   EXTERNAL FIXATION REMOVAL Left 09/28/2017   Procedure: REMOVAL EXTERNAL FIXATION LEG;  Surgeon:  Shona Needles, MD;  Location: [facility];  Service: Orthopedics;  Laterality: Left;   EYE SURGERY Bilateral    cataract   FLEXIBLE SIGMOIDOSCOPY  08/13/2022   Procedure: FLEXIBLE SIGMOIDOSCOPY;  Surgeon: Leighton Ruff, MD;  Location: [facility];  Service: General;;   LAPAROSCOPIC GASTRIC BANDING  2010   MELANOMA EXCISION Left 04/24/2021   Procedure: REEXCISION MARGIN LEFT HEEL MELANOMA;  Surgeon: [name], MD;  Location: [facility];  Service: General;  Laterality: Left;   MELANOMA EXCISION Left 05/22/2021   Procedure: RE-EXCISION OF LEFT HEEL MELANOMA;  Surgeon: [name], MD;  Location: [facility];  Service: General;  Laterality: Left;   OPEN REDUCTION INTERNAL FIXATION (ORIF) DISTAL RADIAL FRACTURE Right 07/29/2016   Procedure: RIGHT OPEN REDUCTION INTERNAL FIXATION (ORIF) DISTAL RADIAL FRACTURE;  Surgeon: Leanora Cover, MD;  Location: [facility];  Service: Orthopedics;  Laterality: Right;   ORIF ANKLE FRACTURE Left 09/28/2017   Procedure: OPEN REDUCTION INTERNAL FIXATION (ORIF) ANKLE FRACTURE;  Surgeon: Shona Needles, MD;  Location: [facility];  Service: Orthopedics;  Laterality: Left;   POLYPECTOMY     SKIN DEBRIDEMENT Left 05/22/2021   Procedure: RECONSTRUCTION LEFT HEEL;  Surgeon: Wallace Going, DO;  Location: [facility];  Service: Plastics;  Laterality: Left;   SKIN FULL THICKNESS GRAFT Left 04/24/2021   Procedure: RECONSTRUCTION OF LEFT  HEEL;  Surgeon: Wallace Going, DO;  Location: [facility];  Service: Plastics;  Laterality: Left;   SKIN FULL THICKNESS GRAFT Left 05/22/2021   Procedure: POSSIBLE SKIN GRAFT;  Surgeon: Wallace Going, DO;  Location: [facility];  Service: Plastics;  Laterality: Left;   TOTAL ABDOMINAL HYSTERECTOMY W/ BILATERAL SALPINGOOPHORECTOMY  1980   WISDOM TOOTH EXTRACTION     XI ROBOTIC ASSISTED LOWER ANTERIOR RESECTION N/A 08/13/2022   Procedure: XI ROBOTIC ASSISTED LOWER ANTERIOR RESECTION, WITH INTRAOPERATIVE ASSESSMENT OF PERFUSION USING FIREFLY;   Surgeon: Leighton Ruff, MD;  Location: [facility];  Service: General;  Laterality: N/A;   Patient Active Problem List   Diagnosis Date Noted   Rectal cancer ([place]) 04/07/2022   Melanoma of skin ([place]) 04/09/2021   Acquired trigger finger 01/31/2021   Allergic rhinitis 01/31/2021   Body mass index (BMI) 40.0-44.9, adult ([place]) 01/31/2021   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   Dependence on other enabling machines and devices 01/31/2021   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[place]) 01/31/2021   Kidney stone 01/31/2021   Diverticulosis of colon 01/31/2021   Encounter for general adult medical examination with abnormal findings 01/31/2021   High blood pressure 01/31/2021   History of depression 01/31/2021   Hyperlipidemia 01/31/2021   Morbid obesity ([place]) 01/31/2021   Neuropathy 01/31/2021   Non-toxic multinodular goiter 01/31/2021   Onychomycosis 01/31/2021   Penicillin allergy 01/31/2021   Personal history of (healed) traumatic fracture 01/31/2021   Personal history of colonic polyps 01/31/2021   Pure hypercholesterolemia 01/31/2021   Tinnitus 01/31/2021   Vitamin D deficiency 01/31/2021   Lower limb ulcer, heel or midfoot, left, limited to breakdown of skin ([place]) 04/11/2020   Neck mass 02/07/2020   Trimalleolar fracture of ankle, closed, left, initial encounter 09/24/2017   Tibia/fibula fracture 09/23/2017   Closed left ankle fracture 09/23/2017   Ankle dislocation, left, initial encounter 09/23/2017   Thyroid disease    Sleep apnea    Osteoporosis    Type 2 diabetes mellitus ([place])    Oth intartic fracture of lower end of right radius, init 07/29/2016   Anal and rectal polyp 12/16/2013   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 Pt reports she has no urge to have a bowel movement, unaware of any leakage, has small bowel movements with urinary voids as well. Pt reports she goes through a roll of toilet paper per day. Pt also reports sometimes she doesn't feel if any stool has emptied.   Pt took chemo radiation pills and 4 radiation treatments, did have Lt heel ulcer develop and chemo pills stopped.   Fluid intake: Yes: water - 1 bottle; diet coke per day    PAIN:  Are you having pain? No   PRECAUTIONS: Other: recent rectal CA, no longer having treatments  WEIGHT BEARING RESTRICTIONS: Yes wearing draco shoe with heel out for wound healing, but normal weight bearing  FALLS:  Has patient fallen in last 6 months? No  LIVING ENVIRONMENT: Lives with: lives alone Lives in: House/apartment   OCCUPATION: retired   PLOF: Independent  PATIENT GOALS: to have less fecal leakage  PERTINENT HISTORY:  low anterior resection 08/13/2022, no evidence of metastatic disease, has Lt heel ulcer melanoma biopsy 03/26/2021 (reexcision 04/24/2021 and 05/22/2021, Diabetes, Laparoscopic gastric band surgery,  Sexual abuse: No  BOWEL MOVEMENT: Pain with bowel movement: No Type of bowel movement:Type (Bristol Stool Scale) 5, Frequency multiple bowel movements per day, and Strain No Fully empty rectum: No Leakage: Yes: pt states she has no awareness of leakage and no urge to have a bowel movement. Also has small bowel movements with urinary voids.  And reports she  wipes sever times after every bowel movement and uses wet wipes. Does use destin if needed too.  Pads: Yes: depends - 1-3 per day due to soiling. Sometimes one change per night but other not.  Fiber supplement: No  URINATION: Pain with urination: No Fully empty bladder: Yes:   Stream: Strong and Weak Urgency: No Frequency: right at every 2 hours, usually gets up 3-4x per night to urinate since cancer treatments Leakage:  not usually, but may have small dripping if can't get there quick enough Pads: No  INTERCOURSE: Pain with intercourse:  not active Denied dryness  PREGNANCY: Vaginal deliveries 1 Tearing Yes: "a lot of tearing" C-section deliveries 0 Currently pregnant No  PROLAPSE: None   OBJECTIVE:   DIAGNOSTIC FINDINGS:    COGNITION: Overall cognitive status: Within functional limits for tasks assessed     SENSATION: Light touch: Deficits decreased sensation at rectum, bil neuropathy worse in Lt Proprioception: Appears intact  MUSCLE LENGTH: Bil hamstrings and adductors limited by 25%   POSTURE: rounded shoulders and forward head   LUMBARAROM/PROM:  A/PROM A/PROM  eval  Flexion Limited by 50%  Extension Limited by 25%  Right lateral flexion Limited by 25%  Left lateral flexion Limited by 25%  Right rotation Limited by 50%  Left rotation Limited by 50%   (Blank rows = not tested)  LOWER EXTREMITY ROM:  WFL  LOWER EXTREMITY MMT:  Bil hips grossly 4/5, knees and ankles 5/5  PALPATION:   General  mild TTP in lower abdominal quadrants and fascial restrictions throughout abdominal quadrants                External Perineal Exam pt deferred today                             Internal Pelvic Floor pt deferred today  Patient confirms identification and approves PT to assess internal pelvic  floor and treatment No  PELVIC MMT:   MMT eval  Vaginal   Internal Anal Sphincter   External Anal Sphincter   Puborectalis   Diastasis Recti   (Blank rows  = not tested)        TONE: pt deferred today  PROLAPSE: pt deferred today  TODAY'S TREATMENT:                                                                                                                              DATE: 09/11/2022   EVAL Examination completed, findings reviewed, pt educated on POC, voiding mechanics and abdominal massage. Pt motivated to participate in PT and agreeable to attempt recommendations.     PATIENT EDUCATION:  Education details: voiding mechanics and abdominal massage. Person educated: Patient Education method: Explanation, Demonstration, Tactile cues, Verbal cues, and Handouts Education comprehension: verbalized understanding and returned demonstration  HOME EXERCISE PROGRAM: voiding mechanics and abdominal massage.  ASSESSMENT:  CLINICAL IMPRESSION: Patient is a 76 y.o. female  who was seen today for physical therapy evaluation and treatment for fecal incontinence status post rectal CA with chemo and radiation and low anterior resection 08/13/2022.  Pt reports since CA treatments she has had more frequency of urine and has small bowel movements with urination now, sometimes unaware until wiping. Pt also states she no longer has urge to have status post surgery and only knows of leakage with feeling it at skin not with emptying. Pt reports she frequently goes to the bathroom without urge of bowels or bladder to make sure she hasn't had accidents and attempts to go just in case. Pt reports usually type 5 stools and small leakages when having them and wears 1-3 depends per day and usually 0-1 per night. Pt deferred internal today but agreeable for next visit. Pt found to have decreased flexibility in spine and bil hips, decreased strength in core and bil hips, fascial restrictions in abdomen, does have Lt heel ulceration and wearing draco shoe with heel out. Pt educated on voiding mechanics and abdominal massage. Pt would benefit from additional PT to further  address deficits.    OBJECTIVE IMPAIRMENTS: decreased coordination, decreased endurance, decreased mobility, decreased strength, increased fascial restrictions, impaired flexibility, improper body mechanics, and postural dysfunction.   ACTIVITY LIMITATIONS: continence  PARTICIPATION LIMITATIONS: community activity  PERSONAL FACTORS: Time since onset of injury/illness/exacerbation and 1 comorbidity: medical history  are also affecting patient's functional outcome.   REHAB POTENTIAL: Good  CLINICAL DECISION MAKING: Stable/uncomplicated  EVALUATION COMPLEXITY: Low   GOALS: Goals reviewed with patient? Yes  SHORT TERM GOALS: Target date: 10/09/22  Pt to be I with HEP.  Baseline: Goal status: INITIAL  2.  Pt will report 3 BMs per per day due to improved muscle tone and coordination with BMs.  Baseline:  Goal status: INITIAL  3.  Pt to report more no more than 2 depend changes per day to demonstrate decreased fecal leakage Baseline:  Goal  status: INITIAL  4.  Pt to demonstrate at least 3/5 pelvic floor strength for improved pelvic stability and decreased strain at pelvic floor/ decrease leakage.  Baseline:  Goal status: INITIAL   LONG TERM GOALS: Target date: 12/11/22  Pt to be I with advanced HEP.  Baseline:  Goal status: INITIAL  2.  Pt will report her BMs are complete due to improved bowel habits and evacuation techniques.  Baseline:  Goal status: INITIAL  3.  Pt to report more no more than 1 depend changes per day to demonstrate decreased fecal leakage Baseline:  Goal status: INITIAL  4.   Pt to demonstrate at least 4/5 pelvic floor strength and ability to fully relax for improved pelvic stability and decreased strain at pelvic floor/ decrease leakage.  Baseline:  Goal status: INITIAL  5.  Pt to be I with abdominal massage, voiding mechanics, and breathing mechanics to improve bowel habits.  Baseline:  Goal status: INITIAL  6.  Pt to demonstrate improved  coordination with breathing mechanics and pelvic floor activation with functional exercises such as 10# squat for improved pelvic stability and decreased leakage.  Baseline:  Goal status: INITIAL  PLAN:  PT FREQUENCY: 1x/week  PT DURATION:  6 sessions  PLANNED INTERVENTIONS: Therapeutic exercises, Therapeutic activity, Neuromuscular re-education, Patient/Family education, Self Care, Joint mobilization, Electrical stimulation, Spinal mobilization, Cryotherapy, scar mobilization, Taping, Biofeedback, and Manual therapy  PLAN FOR NEXT SESSION: internal if pt consents, abdominal massage, hip and spine stretching, core and hip strengthening, voiding mechanics, pelvic floor and breathing coordination   Stacy Gardner, PT, DPT 11/30/233:50 PM

## 2022-09-17 ENCOUNTER — Encounter (HOSPITAL_BASED_OUTPATIENT_CLINIC_OR_DEPARTMENT_OTHER): Payer: PPO | Attending: General Surgery | Admitting: General Surgery

## 2022-09-17 DIAGNOSIS — Z85820 Personal history of malignant melanoma of skin: Secondary | ICD-10-CM | POA: Diagnosis not present

## 2022-09-17 DIAGNOSIS — S91302A Unspecified open wound, left foot, initial encounter: Secondary | ICD-10-CM | POA: Diagnosis not present

## 2022-09-17 DIAGNOSIS — L97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: Secondary | ICD-10-CM | POA: Insufficient documentation

## 2022-09-17 DIAGNOSIS — Z87891 Personal history of nicotine dependence: Secondary | ICD-10-CM | POA: Insufficient documentation

## 2022-09-17 DIAGNOSIS — C20 Malignant neoplasm of rectum: Secondary | ICD-10-CM | POA: Insufficient documentation

## 2022-09-17 DIAGNOSIS — E11621 Type 2 diabetes mellitus with foot ulcer: Secondary | ICD-10-CM | POA: Diagnosis not present

## 2022-09-17 DIAGNOSIS — Z923 Personal history of irradiation: Secondary | ICD-10-CM | POA: Diagnosis not present

## 2022-09-17 DIAGNOSIS — M1990 Unspecified osteoarthritis, unspecified site: Secondary | ICD-10-CM | POA: Diagnosis not present

## 2022-09-17 DIAGNOSIS — Z85048 Personal history of other malignant neoplasm of rectum, rectosigmoid junction, and anus: Secondary | ICD-10-CM | POA: Diagnosis not present

## 2022-09-17 DIAGNOSIS — Z9221 Personal history of antineoplastic chemotherapy: Secondary | ICD-10-CM | POA: Insufficient documentation

## 2022-09-18 NOTE — Progress Notes (Signed)
TYLAH, MANCILLAS ([id number]) [id number] Page 1 of 8 Visit Report for 09/17/2022 Arrival Information Details Patient Name: Date of Service: Destiny [name], Destiny [name] 09/17/2022 1:15 PM Medical Record Number: [id number] Patient Account Number: [id number] Date of Birth/Sex: Treating RN: [birth year] (76 y.o. Donalda Ewings Primary Care Cherl Gorney: Deland Pretty Other Clinician: Referring Selassie Spatafore: Treating Deazia Lampi/Extender: Raylene Everts in Treatment: 46 Visit Information History Since Last Visit Added or deleted any medications: No Patient Arrived: Ambulatory Any new allergies or adverse reactions: No Arrival Time: 13:44 Had a fall or experienced change in No Accompanied By: self activities of daily living that may affect Transfer Assistance: None risk of falls: Patient Identification Verified: Yes Signs or symptoms of abuse/neglect since last visito No Secondary Verification Process Completed: Yes Hospitalized since last visit: No Patient Requires Transmission-Based Precautions: No Implantable device outside of the clinic excluding No Patient Has Alerts: No cellular tissue based products placed in the center since last visit: Has Dressing in Place as Prescribed: Yes Has Compression in Place as Prescribed: Yes Pain Present Now: Yes Electronic Signature(s) Signed: 09/17/2022 4:06:00 PM By: Sharyn Creamer RN, BSN Entered By: Sharyn Creamer on 09/17/2022 13:45:17 -------------------------------------------------------------------------------- Encounter Discharge Information Details Patient Name: Date of Service: Destiny American. 09/17/2022 1:15 PM Medical Record Number: [id number] Patient Account Number: [id number] Date of Birth/Sex: Treating RN: 11-09-1945 (76 y.o. Donalda Ewings Primary Care Jadine Brumley: Deland Pretty Other Clinician: Referring Doratha Mcswain: Treating Andrei Mccook/Extender: Raylene Everts in Treatment:  17 Encounter Discharge Information Items Post Procedure Vitals Discharge Condition: Stable Temperature (F): 98.4 Ambulatory Status: Ambulatory Pulse (bpm): 81 Discharge Destination: Home Respiratory Rate (breaths/min): 18 Transportation: Private Auto Blood Pressure (mmHg): 118/76 Accompanied By: self Schedule Follow-up Appointment: Yes Clinical Summary of Care: Patient Declined Electronic Signature(s) Signed: 09/17/2022 4:06:00 PM By: Sharyn Creamer RN, BSN Entered By: Sharyn Creamer on 09/17/2022 14:14:00 Marney, Lurene Shadow ([id number]) [id number] Page 2 of 8 -------------------------------------------------------------------------------- Lower Extremity Assessment Details Patient Name: Date of Service: Destiny [name] 09/17/2022 1:15 PM Medical Record Number: [id number] Patient Account Number: [id number] Date of Birth/Sex: Treating RN: 12/29/1945 (76 y.o. Donalda Ewings Primary Care Anterio Scheel: Deland Pretty Other Clinician: Referring Tiea Manninen: Treating Hibba Schram/Extender: Raylene Everts in Treatment: 17 Edema Assessment Assessed: [name]: No] Patrice Paradise: No] Edema: [Left: N] [Right: o] Calf Left: Right: Point of Measurement: 29 cm From Medial Instep 36.5 cm Ankle Left: Right: Point of Measurement: 10 cm From Medial Instep 21 cm Vascular Assessment Pulses: Dorsalis Pedis Palpable: [Left:Yes Yes] Electronic Signature(s) Signed: 09/17/2022 4:06:00 PM By: Sharyn Creamer RN, BSN Entered By: Sharyn Creamer on 09/17/2022 13:50:03 -------------------------------------------------------------------------------- Multi Wound Chart Details Patient Name: Date of Service: Destiny American. 09/17/2022 1:15 PM Medical Record Number: [id number] Patient Account Number: [id number] Date of Birth/Sex: Treating RN: [birth year] (76 y.o. F) Primary Care Dohn Stclair: Deland Pretty Other Clinician: Referring Huck Ashworth: Treating Lashanti Chambless/Extender:  Raylene Everts in Treatment: 17 Vital Signs Height(in): 63 Pulse(bpm): 81 Weight(lbs): Blood Pressure(mmHg): 118/76 Body Mass Index(BMI): Temperature(F): 98.4 Respiratory Rate(breaths/min): 18 [1:Photos:] [N/A:N/A] Left Calcaneus N/A N/A Wound Location: Hematoma N/A N/A Wounding Event: Trauma, Other N/A N/A Primary Etiology: Type II Diabetes, Osteoarthritis N/A N/A Comorbid History: 05/05/2022 N/A N/A Date Acquired: 50 N/A N/A Weeks of Treatment: Open N/A N/A Wound Status: No N/A N/A Wound Recurrence: 0.9x0.5x0.1 N/A N/A Measurements L x W x D (cm) 0.353 N/A N/A A (cm) : rea 0.035 N/A N/A Volume (cm) : 94.80% N/A N/A % Reduction in A  rea: 97.40% N/A N/A % Reduction in Volume: Full Thickness Without Exposed N/A N/A Classification: Support Structures Medium N/A N/A Exudate A mount: Sanguinous N/A N/A Exudate Type: red N/A N/A Exudate Color: Flat and Intact N/A N/A Wound Margin: Large (67-100%) N/A N/A Granulation A mount: Red, Pink N/A N/A Granulation Quality: Small (1-33%) N/A N/A Necrotic A mount: Fat Layer (Subcutaneous Tissue): Yes N/A N/A Exposed Structures: Fascia: No Tendon: No Muscle: No Joint: No Bone: No Small (1-33%) N/A N/A Epithelialization: Debridement - Selective/Open Wound N/A N/A Debridement: Pre-procedure Verification/Time Out 13:55 N/A N/A Taken: Lidocaine 4% Topical Solution N/A N/A Pain Control: Necrotic/Eschar, Slough N/A N/A Tissue Debrided: Non-Viable Tissue N/A N/A Level: 0.45 N/A N/A Debridement A (sq cm): rea Curette N/A N/A Instrument: Minimum N/A N/A Bleeding: Pressure N/A N/A Hemostasis A chieved: 0 N/A N/A Procedural Pain: 0 N/A N/A Post Procedural Pain: Procedure was tolerated well N/A N/A Debridement Treatment Response: 0.9x0.5x0.1 N/A N/A Post Debridement Measurements L x W x D (cm) 0.035 N/A N/A Post Debridement Volume: (cm) Callus: Yes N/A N/A Periwound Skin  Texture: Dry/Scaly: Yes N/A N/A Periwound Skin Moisture: Maceration: No No Abnormalities Noted N/A N/A Periwound Skin Color: No Abnormality N/A N/A Temperature: Debridement N/A N/A Procedures Performed: Treatment Notes Wound #1 (Calcaneus) Wound Laterality: Left Cleanser Soap and Water Discharge Instruction: May shower and wash wound with dial antibacterial soap and water prior to dressing change. Wound Cleanser Discharge Instruction: Cleanse the wound with wound cleanser prior to applying a clean dressing using gauze sponges, not tissue or cotton balls. Peri-Wound Care Topical Primary Dressing SILVERCEL Alginate Dressing, 2x2 (in/in) Discharge Instruction: apply to wound bed Secondary Dressing ALLEVYN Heel 4 1/2in x 5 1/2in / 10.5cm x 13.5cm Discharge Instruction: Apply over primary dressing as directed. Woven Gauze Sponge, Non-Sterile 4x4 in Discharge Instruction: Apply over primary dressing as directed. Secured With Elastic Bandage 4 inch (ACE bandage) Discharge Instruction: Secure with ACE bandage as directed. HAYLO, FAKE ([id number]) [id number] Page 4 of 8 Kerlix Roll Sterile, 4.5x3.1 (in/yd) Discharge Instruction: Secure with Kerlix as directed. 38M Medipore Soft Cloth Surgical T 2x10 (in/yd) ape Discharge Instruction: Secure with tape as directed. Compression Wrap Compression Stockings Add-Ons Electronic Signature(s) Signed: 09/17/2022 2:54:06 PM By: Fredirick Maudlin MD FACS Entered By: Fredirick Maudlin on 09/17/2022 14:54:06 -------------------------------------------------------------------------------- Multi-Disciplinary Care Plan Details Patient Name: Date of Service: ELEYNA, BRUGH 09/17/2022 1:15 PM Medical Record Number: [id number] Patient Account Number: [id number] Date of Birth/Sex: Treating RN: 01-04-1946 (76 y.o. Donalda Ewings Primary Care Saprina Chuong: Deland Pretty Other Clinician: Referring Hind Chesler: Treating  Lorilynn Lehr/Extender: Raylene Everts in Treatment: [place] reviewed with physician Active Inactive Necrotic Tissue Nursing Diagnoses: Impaired tissue integrity related to necrotic/devitalized tissue Knowledge deficit related to management of necrotic/devitalized tissue Goals: Necrotic/devitalized tissue will be minimized in the wound bed Date Initiated: 08/08/2022 Target Resolution Date: 09/19/2022 Goal Status: Active Patient/caregiver will verbalize understanding of reason and process for debridement of necrotic tissue Date Initiated: 08/08/2022 Target Resolution Date: 09/19/2022 Goal Status: Active Interventions: Assess patient pain level pre-, during and post procedure and prior to discharge Provide education on necrotic tissue and debridement process Treatment Activities: Apply topical anesthetic as ordered : 08/08/2022 Notes: Wound/Skin Impairment Nursing Diagnoses: Impaired tissue integrity Goals: Patient/caregiver will verbalize understanding of skin care regimen Date Initiated: 09/03/2022 Target Resolution Date: 10/01/2022 Goal Status: Active Interventions: Assess ulceration(s) every visit Treatment Activities: Skin care regimen initiated : 05/19/2022 BRITTANY, OSIER ([id number]) [phone].pdf Page 5 of 8 Notes:  Electronic Signature(s) Signed: 09/17/2022 4:06:00 PM By: Sharyn Creamer RN, BSN Entered By: Sharyn Creamer on 09/17/2022 14:02:12 -------------------------------------------------------------------------------- Pain Assessment Details Patient Name: Date of Service: MYHA, ARIZPE 09/17/2022 1:15 PM Medical Record Number: [id number] Patient Account Number: [id number] Date of Birth/Sex: Treating RN: [birth year] (76 y.o. Donalda Ewings Primary Care Blaire Palomino: Deland Pretty Other Clinician: Referring Sedrick Tober: Treating Niyam Bisping/Extender: Raylene Everts in Treatment:  17 Active Problems Location of Pain Severity and Description of Pain Patient Has Paino Yes Site Locations Rate the pain. Current Pain Level: 2 Pain Management and Medication Current Pain Management: Electronic Signature(s) Signed: 09/17/2022 4:06:00 PM By: Sharyn Creamer RN, BSN Entered By: Sharyn Creamer on 09/17/2022 13:45:59 -------------------------------------------------------------------------------- Patient/Caregiver Education Details Patient Name: Date of Service: Destiny American 12/6/2023andnbsp1:15 PM Medical Record Number: [id number] Patient Account Number: [id number] Date of Birth/Gender: Treating RN: [birth year] (76 y.o. Donalda Ewings Primary Care Physician: Deland Pretty Other Clinician: Referring Physician: Treating Physician/Extender: Raylene Everts in Treatment: [id number] HERBERT, AGUINALDO ([id number]) [id number] Page 6 of 8 Education Provided To: Patient Education Topics Provided Wound Debridement: Methods: Explain/Verbal Responses: State content correctly Electronic Signature(s) Signed: 09/17/2022 4:06:00 PM By: Sharyn Creamer RN, BSN Entered By: Sharyn Creamer on 09/17/2022 14:02:26 -------------------------------------------------------------------------------- Wound Assessment Details Patient Name: Date of Service: Destiny American 09/17/2022 1:15 PM Medical Record Number: [id number] Patient Account Number: [id number] Date of Birth/Sex: Treating RN: 27-Aug-1946 (76 y.o. Donalda Ewings Primary Care Nainika Newlun: Deland Pretty Other Clinician: Referring Itzell Bendavid: Treating Chace Klippel/Extender: Raylene Everts in Treatment: 17 Wound Status Wound Number: 1 Primary Etiology: Trauma, Other Wound Location: Left Calcaneus Wound Status: Open Wounding Event: Hematoma Comorbid History: Type II Diabetes, Osteoarthritis Date Acquired: 05/05/2022 Weeks Of Treatment:  17 Clustered Wound: No Photos Wound Measurements Length: (cm) 0.9 Width: (cm) 0.5 Depth: (cm) 0.1 Area: (cm) 0.353 Volume: (cm) 0.035 % Reduction in Area: 94.8% % Reduction in Volume: 97.4% Epithelialization: Small (1-33%) Tunneling: No Undermining: No Wound Description Classification: Full Thickness Without Exposed Suppor Wound Margin: Flat and Intact Exudate Amount: Medium Exudate Type: Sanguinous Exudate Color: red t Structures Foul Odor After Cleansing: No Slough/Fibrino No Wound Bed Granulation Amount: Large (67-100%) Exposed Structure Granulation Quality: Red, Pink Fascia Exposed: No Necrotic Amount: Small (1-33%) Fat Layer (Subcutaneous Tissue) Exposed: Yes Necrotic Quality: Adherent Slough Tendon Exposed: No Muscle Exposed: No ISABELLAROSE, KOPE ([id number]) [id number] Page 7 of 8 Joint Exposed: No Bone Exposed: No Periwound Skin Texture Texture Color No Abnormalities Noted: No No Abnormalities Noted: Yes Callus: Yes Temperature / Pain Temperature: No Abnormality Moisture No Abnormalities Noted: No Dry / Scaly: Yes Maceration: No Treatment Notes Wound #1 (Calcaneus) Wound Laterality: Left Cleanser Soap and Water Discharge Instruction: May shower and wash wound with dial antibacterial soap and water prior to dressing change. Wound Cleanser Discharge Instruction: Cleanse the wound with wound cleanser prior to applying a clean dressing using gauze sponges, not tissue or cotton balls. Peri-Wound Care Topical Primary Dressing SILVERCEL Alginate Dressing, 2x2 (in/in) Discharge Instruction: apply to wound bed Secondary Dressing ALLEVYN Heel 4 1/2in x 5 1/2in / 10.5cm x 13.5cm Discharge Instruction: Apply over primary dressing as directed. Woven Gauze Sponge, Non-Sterile 4x4 in Discharge Instruction: Apply over primary dressing as directed. Secured With Elastic Bandage 4 inch (ACE bandage) Discharge Instruction: Secure with ACE  bandage as directed. Kerlix Roll Sterile, 4.5x3.1 (in/yd) Discharge Instruction: Secure with Kerlix as directed. 40M Medipore Soft Cloth Surgical T 2x10 (in/yd) ape Discharge Instruction:  Secure with tape as directed. Compression Wrap Compression Stockings Add-Ons Electronic Signature(s) Signed: 09/17/2022 4:06:00 PM By: Sharyn Creamer RN, BSN Entered By: Sharyn Creamer on 09/17/2022 13:53:03 -------------------------------------------------------------------------------- Vitals Details Patient Name: Date of Service: Destiny American. 09/17/2022 1:15 PM Medical Record Number: [id number] Patient Account Number: [id number] Date of Birth/Sex: Treating RN: Feb 25, 1946 (76 y.o. Donalda Ewings Primary Care Jaythan Hinely: Deland Pretty Other Clinician: Referring Eathen Budreau: Treating Leeroy Lovings/Extender: Raylene Everts in Treatment: 17 Vital Signs Time Taken: 13:45 Temperature (F): 98.4 MASHAYLA, LAVIN ([id number]) [id number] Page 8 of 8 Height (in): 63 Pulse (bpm): 81 Respiratory Rate (breaths/min): 18 Blood Pressure (mmHg): 118/76 Reference Range: 80 - 120 mg / dl Electronic Signature(s) Signed: 09/17/2022 4:06:00 PM By: Sharyn Creamer RN, BSN Entered By: Sharyn Creamer on 09/17/2022 13:45:47

## 2022-09-18 NOTE — Progress Notes (Signed)
Destiny [name], Destiny [name] ([id number]) [id number] Page 1 of 9 Visit Report for 09/17/2022 Chief Complaint Document Details Patient Name: Date of Service: Destiny [name], Destiny [name] 09/17/2022 1:15 PM Medical Record Number: [id number] Patient Account Number: [id number] Date of Birth/Sex: Treating RN: 05/20/1946 (76 y.o. F) Primary Care Provider: Deland Pretty Other Clinician: Referring Provider: Treating Provider/Extender: Raylene Everts in Treatment: 17 Information Obtained from: Patient Chief Complaint Patient presents to the wound care center with open non-healing surgical wound(s) Electronic Signature(s) Signed: 09/17/2022 2:56:17 PM By: Fredirick Maudlin MD FACS Entered By: Fredirick Maudlin on 09/17/2022 14:56:17 -------------------------------------------------------------------------------- Debridement Details Patient Name: Date of Service: Destiny [name] 09/17/2022 1:15 PM Medical Record Number: [id number] Patient Account Number: [id number] Date of Birth/Sex: Treating RN: 08-Jul-1946 (76 y.o. Donalda Ewings Primary Care Provider: Deland Pretty Other Clinician: Referring Provider: Treating Provider/Extender: Raylene Everts in Treatment: 17 Debridement Performed for Assessment: Wound #1 Left Calcaneus Performed By: Physician Fredirick Maudlin, MD Debridement Type: Debridement Level of Consciousness (Pre-procedure): Awake and Alert Pre-procedure Verification/Time Out Yes - 13:55 Taken: Start Time: 14:00 Pain Control: Lidocaine 4% T opical Solution T Area Debrided (L x W): otal 0.9 (cm) x 0.5 (cm) = 0.45 (cm) Tissue and other material debrided: Eschar, Slough, Slough Level: Non-Viable Tissue Debridement Description: Selective/Open Wound Instrument: Curette Bleeding: Minimum Hemostasis Achieved: Pressure Procedural Pain: 0 Post Procedural Pain: 0 Response to Treatment: Procedure was tolerated well Level of  Consciousness (Post- Awake and Alert procedure): Post Debridement Measurements of Total Wound Length: (cm) 0.9 Width: (cm) 0.5 Depth: (cm) 0.1 Volume: (cm) 0.035 Character of Wound/Ulcer Post Debridement: Improved Post Procedure Diagnosis Same as Pre-procedure Destiny [name], Destiny [name] ([id number]) [id number] Page 2 of 9 Notes scribed for Dr Celine Ahr by Sharyn Creamer, RN Electronic Signature(s) Signed: 09/17/2022 4:06:00 PM By: Sharyn Creamer RN, BSN Signed: 09/17/2022 5:07:18 PM By: Fredirick Maudlin MD FACS Entered By: Sharyn Creamer on 09/17/2022 14:01:59 -------------------------------------------------------------------------------- HPI Details Patient Name: Date of Service: Destiny [name] 09/17/2022 1:15 PM Medical Record Number: [id number] Patient Account Number: [id number] Date of Birth/Sex: Treating RN: October 27, 1945 (76 y.o. 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 After the final reexcision, the wound had an ACell graft applied. She reports that she had good closure of skin over the site. Earlier this year, at the end of May, however, she was diagnosed with rectal cancer and is currently undergoing neoadjuvant therapy (chemotherapy and radiation) in anticipation of future surgery. She reports that about 2 weeks ago, she noted that her skin coverage over the heel was beginning to break down. Her oncologist felt that perhaps the Xeloda was contributing to wound failure and stopped this medication. She continues to receive neoadjuvant radiation therapy, however. She saw plastic surgery last week, and they referred her to the wound care center for further evaluation  and management. She is a type II diabetic, well controlled, with the most recent A1c available to me being 5.9%. ABIs done 1 year ago were normal. On the patient's left heel, there is an irregular wound with some skin maceration. The fat layer is exposed and there is slough over the entire surface. Outside of the area of maceration, the skin is in good condition. No erythema, induration, or odor. No concern for infection. 05/26/2022: The  wound is smaller today and is extremely clean, without any slough buildup. The periwound skin is in much better condition and is no longer macerated. She is scheduled to complete her radiation treatment on Wednesday. 06/03/2022: The wound continues to contract. There is just a little bit of eschar and callus accumulation. The surface is clean. She has completed all of her neoadjuvant therapy and is awaiting surgery in November. 06/10/2022: The wound is smaller by about a centimeter in all dimensions. She does have a little bit of eschar near the Achilles portion of the wound. The wound surface itself is robust and healthy-appearing. 06/24/2022: The wound continues to contract. There is just a small portion open in the center. She does have some dry skin and periwound callus circumferentially. No concern for infection. 07/08/2022: The wound is down to just a pinhole. There is a little bit of eschar on the surface. No concern for infection. 07/22/2022: Her wound is healed. 08/08/2022: Unfortunately, it seems that when her wound was being dressed at her last visit, the nurse applying the dressing pulled some dead skin off and reopened to the wound. I am not sure why this was not brought to my attention, but the patient has been caring for the wound at home with her supplies from her previous admission. She has been wearing her heel off loader and using Hydrofera Blue. There is some eschar accumulation, but no erythema, induration, or other concern for infection. Her  colorectal surgery is scheduled for next Wednesday. 09/03/2022: Ms. Angert returns after undergoing her colon surgery. She has been dressing the wound at home with [facility], but has not been wearing her heel cup. The wound is more open today with a lot of periwound callus, dry skin, and a light layer of slough. No overt signs of infection. 09/17/2022: The wound is little bit smaller today. There is some periwound tissue maceration. She says she has been wearing her heel cup more regularly. Electronic Signature(s) Signed: 09/17/2022 2:59:01 PM By: Fredirick Maudlin MD FACS Entered By: Fredirick Maudlin on 09/17/2022 14:59:00 [name] ([id number]) [id number] Page 3 of 9 -------------------------------------------------------------------------------- Physical Exam Details Patient Name: Date of Service: Destiny [name], Destiny [name] 09/17/2022 1:15 PM Medical Record Number: [id number] Patient Account Number: [id number] Date of Birth/Sex: Treating RN: 05-15-1946 (76 y.o. F) Primary Care Provider: Deland Pretty Other Clinician: Referring Provider: Treating Provider/Extender: Raylene Everts in Treatment: 17 Constitutional . . . . No acute distress. Respiratory Normal work of breathing on room air. Notes 09/17/2022: The wound is little bit smaller today. There is some periwound tissue maceration. Electronic Signature(s) Signed: 09/17/2022 2:59:26 PM By: Fredirick Maudlin MD FACS Entered By: Fredirick Maudlin on 09/17/2022 14:59:26 -------------------------------------------------------------------------------- Physician Orders Details Patient Name: Date of Service: Destiny [name] 09/17/2022 1:15 PM Medical Record Number: [id number] Patient Account Number: [id number] Date of Birth/Sex: Treating RN: 06-10-1946 (76 y.o. Donalda Ewings Primary Care Provider: Deland Pretty Other Clinician: Referring Provider: Treating Provider/Extender:  Raylene Everts in Treatment: [phone] Verbal / Phone Orders: No Diagnosis Coding ICD-10 Coding Code Description [phone] Non-pressure chronic ulcer of left heel and midfoot with fat layer exposed T81.31XS Disruption of external operation (surgical) wound, not elsewhere classified, sequela C43.9 Malignant melanoma of skin, unspecified C20 Malignant neoplasm of rectum E11.9 Type 2 diabetes mellitus without complications E31.54 Personal history of antineoplastic chemotherapy Z92.3 Personal history of irradiation Follow-up Appointments ppointment in 2 weeks. - Dr. Celine Ahr RM 1 Return A Anesthetic Wound #1 Left  Calcaneus (In clinic) Topical Lidocaine 4% applied to wound bed Bathing/ Shower/ Hygiene Other Bathing/Shower/Hygiene Orders/Instructions: - Change wound dressing after bathing Edema Control - Lymphedema / SCD / Other Avoid standing for long periods of time. Moisturize legs daily. Off-Loading Other: - Please wear off-loading shoe on left foot to keep pressure off the heel Wound Treatment Wound #1 - Calcaneus Wound Laterality: Left Cleanser: Soap and Water 1 x Per Day/30 Days ANAIRIS, KNICK ([id number]) [id number] Page 4 of 9 Discharge Instructions: May shower and wash wound with dial antibacterial soap and water prior to dressing change. Cleanser: Wound Cleanser 1 x Per Day/30 Days Discharge Instructions: Cleanse the wound with wound cleanser prior to applying a clean dressing using gauze sponges, not tissue or cotton balls. Prim Dressing: SILVERCEL Alginate Dressing, 2x2 (in/in) (Dispense As Written) 1 x Per Day/30 Days ary Discharge Instructions: apply to wound bed Secondary Dressing: ALLEVYN Heel 4 1/2in x 5 1/2in / 10.5cm x 13.5cm 1 x Per Day/30 Days Discharge Instructions: Apply over primary dressing as directed. Secondary Dressing: Woven Gauze Sponge, Non-Sterile 4x4 in 1 x Per Day/30 Days Discharge Instructions: Apply over  primary dressing as directed. Secured With: Elastic Bandage 4 inch (ACE bandage) 1 x Per Day/30 Days Discharge Instructions: Secure with ACE bandage as directed. Secured With: [employer], 4.5x3.1 (in/yd) (DME) (Generic) 1 x Per Day/30 Days Discharge Instructions: Secure with Kerlix as directed. Secured With: 90M Medipore Public affairs consultant Surgical T 2x10 (in/yd) (Generic) 1 x Per Day/30 Days ape Discharge Instructions: Secure with tape as directed. Electronic Signature(s) Signed: 09/17/2022 5:07:18 PM By: Fredirick Maudlin MD FACS Entered By: Fredirick Maudlin on 09/17/2022 14:59:44 -------------------------------------------------------------------------------- Problem List Details Patient Name: Date of Service: Destiny [name] 09/17/2022 1:15 PM Medical Record Number: [id number] Patient Account Number: [id number] Date of Birth/Sex: Treating RN: 03-Apr-1946 (76 y.o. F) Primary Care Provider: Deland Pretty Other Clinician: Referring Provider: Treating Provider/Extender: Raylene Everts in Treatment: 17 Active Problems ICD-10 Encounter Code Description Active Date MDM Diagnosis [phone] Non-pressure chronic ulcer of left heel and midfoot with fat layer exposed 05/19/2022 No Yes T81.31XS Disruption of external operation (surgical) wound, not elsewhere classified, 05/19/2022 No Yes sequela C43.9 Malignant melanoma of skin, unspecified 05/19/2022 No Yes C20 Malignant neoplasm of rectum 05/19/2022 No Yes E11.9 Type 2 diabetes mellitus without complications 12/18/3289 No Yes Z92.21 Personal history of antineoplastic chemotherapy 05/19/2022 No Yes Destiny [name], Destiny [name] ([id number]) [id number] Page 5 of 9 Z92.3 Personal history of irradiation 05/19/2022 No Yes Inactive Problems Resolved Problems Electronic Signature(s) Signed: 09/17/2022 2:53:41 PM By: Fredirick Maudlin MD FACS Entered By: Fredirick Maudlin on 09/17/2022  14:53:40 -------------------------------------------------------------------------------- Progress Note Details Patient Name: Date of Service: Destiny [name]. 09/17/2022 1:15 PM Medical Record Number: [id number] Patient Account Number: [id number] Date of Birth/Sex: Treating RN: 06-24-1946 (76 y.o. 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 After the final reexcision, the wound had an ACell graft applied. She reports that she had good closure of skin over the site. Earlier this year, at the end of May, however, she was diagnosed with rectal cancer and is currently undergoing neoadjuvant therapy (chemotherapy and radiation) in anticipation of future surgery.  She reports that about 2 weeks ago, she noted that her skin coverage over the heel was beginning to break down. Her oncologist felt that perhaps the Xeloda was contributing to wound failure and stopped this medication. She continues to receive neoadjuvant radiation therapy, however. She saw plastic surgery last week, and they referred her to the wound care center for further evaluation and management. She is a type II diabetic, well controlled, with the most recent A1c available to me being 5.9%. ABIs done 1 year ago were normal. On the patient's left heel, there is an irregular wound with some skin maceration. The fat layer is exposed and there is slough over the entire surface. Outside of the area of maceration, the skin is in good condition. No erythema, induration, or odor. No concern for infection. 05/26/2022: The  wound is smaller today and is extremely clean, without any slough buildup. The periwound skin is in much better condition and is no longer macerated. She is scheduled to complete her radiation treatment on Wednesday. 06/03/2022: The wound continues to contract. There is just a little bit of eschar and callus accumulation. The surface is clean. She has completed all of her neoadjuvant therapy and is awaiting surgery in November. 06/10/2022: The wound is smaller by about a centimeter in all dimensions. She does have a little bit of eschar near the Achilles portion of the wound. The wound surface itself is robust and healthy-appearing. 06/24/2022: The wound continues to contract. There is just a small portion open in the center. She does have some dry skin and periwound callus circumferentially. No concern for infection. 07/08/2022: The wound is down to just a pinhole. There is a little bit of eschar on the surface. No concern for infection. 07/22/2022: Her wound is healed. 08/08/2022: Unfortunately, it seems that when her wound was being dressed at her last visit, the nurse applying the dressing pulled some dead skin off and reopened to the wound. I am not sure why this was not brought to my attention, but the patient has been caring for the wound at home with her supplies from her previous admission. She has been wearing her heel off loader and using Hydrofera Blue. There is some eschar accumulation, but no erythema, induration, or other concern for infection. Her colorectal surgery is scheduled for next Wednesday. 09/03/2022: Ms. Finan returns after undergoing her colon surgery. She has been dressing the wound at home with [facility], but has not been wearing her heel cup. The wound is more open today with a lot of periwound callus, dry skin, and a light layer of slough. No overt signs of infection. 09/17/2022: The wound is little bit smaller today. There is some periwound tissue maceration. She  says she has been wearing her heel cup more regularly. Patient History Information obtained from Patient. Social History Former smoker - 1991, Marital Status - Divorced, Alcohol Use - Never, Drug Use - No History, Caffeine Use - Moderate - Diet coke,coffee. LOVETA, DELLIS ([id number]) [id number] Page 6 of 9 Medical History Endocrine Patient has history of Type II Diabetes - Metformin pillsand Ozempic Musculoskeletal Patient has history of Osteoarthritis Hospitalization/Surgery History - Melanoma excision to left heel;ORIF Left ankle fracture;[facility] - rectal surgery. Medical A Surgical History Notes nd Eyes Macular Degeneration Ear/Nose/Mouth/Throat Tinnitus Oncologic 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 has had Chemotherapy (stopped) and Radiation -8 treatments left as of 05/19/22 Rectal cancer Psychiatric Hx Depression Objective Constitutional No acute distress. Vitals Time Taken: 1:45 PM, Height: 63 in, Temperature: 98.4  F, Pulse: 81 bpm, Respiratory Rate: 18 breaths/min, Blood Pressure: 118/76 mmHg. Respiratory Normal work of breathing on room air. General Notes: 09/17/2022: The wound is little bit smaller today. There is some periwound tissue maceration. Integumentary (Hair, Skin) Wound #1 status is Open. Original cause of wound was Hematoma. The date acquired was: 05/05/2022. The wound has been in treatment 17 weeks. The wound is located on the Left Calcaneus. The wound measures 0.9cm length x 0.5cm width x 0.1cm depth; 0.353cm^2 area and 0.035cm^3 volume. There is Fat Layer (Subcutaneous Tissue) exposed. There is no tunneling or undermining noted. There is a medium amount of sanguinous drainage noted. The wound margin is flat and intact. There is large (67-100%) red, pink granulation within the wound bed. There is a small (1-33%) amount of necrotic tissue within the wound bed including Adherent Slough. The periwound skin appearance had no abnormalities noted for  color. The periwound skin appearance exhibited: Callus, Dry/Scaly. The periwound skin appearance did not exhibit: Maceration. Periwound temperature was noted as No Abnormality. Assessment Active Problems ICD-10 Non-pressure chronic ulcer of left heel and midfoot with fat layer exposed Disruption of external operation (surgical) wound, not elsewhere classified, sequela Malignant melanoma of skin, unspecified Malignant neoplasm of rectum Type 2 diabetes mellitus without complications Personal history of antineoplastic chemotherapy Personal history of irradiation Procedures Wound #1 Pre-procedure diagnosis of Wound #1 is a Trauma, Other located on the Left Calcaneus . There was a Selective/Open Wound Non-Viable Tissue Debridement with a total area of 0.45 sq cm performed by Fredirick Maudlin, MD. With the following instrument(s): Curette Material removed includes Eschar and Slough and after achieving pain control using Lidocaine 4% Topical Solution. No specimens were taken. A time out was conducted at 13:55, prior to the start of the procedure. A Minimum amount of bleeding was controlled with Pressure. The procedure was tolerated well with a pain level of 0 throughout and a pain level of 0 following the procedure. Post Debridement Measurements: 0.9cm length x 0.5cm width x 0.1cm depth; 0.035cm^3 volume. Character of Wound/Ulcer Post Debridement is improved. Post procedure Diagnosis Wound #1: Same as Pre-Procedure General Notes: scribed for Dr Celine Ahr by Sharyn Creamer, RN. KAIREE, KOZMA ([id number]) [id number] Page 7 of 9 Plan Follow-up Appointments: Return Appointment in 2 weeks. - Dr. Celine Ahr RM 1 Anesthetic: Wound #1 Left Calcaneus: (In clinic) Topical Lidocaine 4% applied to wound bed Bathing/ Shower/ Hygiene: Other Bathing/Shower/Hygiene Orders/Instructions: - Change wound dressing after bathing Edema Control - Lymphedema / SCD / Other: Avoid standing for  long periods of time. Moisturize legs daily. Off-Loading: Other: - Please wear off-loading shoe on left foot to keep pressure off the heel WOUND #1: - Calcaneus Wound Laterality: Left Cleanser: Soap and Water 1 x Per Day/30 Days Discharge Instructions: May shower and wash wound with dial antibacterial soap and water prior to dressing change. Cleanser: Wound Cleanser 1 x Per Day/30 Days Discharge Instructions: Cleanse the wound with wound cleanser prior to applying a clean dressing using gauze sponges, not tissue or cotton balls. Prim Dressing: SILVERCEL Alginate Dressing, 2x2 (in/in) (Dispense As Written) 1 x Per Day/30 Days ary Discharge Instructions: apply to wound bed Secondary Dressing: ALLEVYN Heel 4 1/2in x 5 1/2in / 10.5cm x 13.5cm 1 x Per Day/30 Days Discharge Instructions: Apply over primary dressing as directed. Secondary Dressing: Woven Gauze Sponge, Non-Sterile 4x4 in 1 x Per Day/30 Days Discharge Instructions: Apply over primary dressing as directed. Secured With: Elastic Bandage 4 inch (ACE bandage) 1 x Per Day/30  Days Discharge Instructions: Secure with ACE bandage as directed. Secured With: [employer], 4.5x3.1 (in/yd) (DME) (Generic) 1 x Per Day/30 Days Discharge Instructions: Secure with Kerlix as directed. Secured With: 31M Medipore Public affairs consultant Surgical T 2x10 (in/yd) (Generic) 1 x Per Day/30 Days ape Discharge Instructions: Secure with tape as directed. 09/17/2022: The wound is little bit smaller today. There is some periwound tissue maceration. I used a curette to debride slough and eschar from the wound. We will apply periwound zinc oxide to help reduce the tissue maceration. Continue silver alginate to the wound surface. She was reminded to wear her heel offloading shoe and heel cup as well as avoid applying pressure to the heel when she is sitting or lying down. She says that she is making an effort to drink protein shakes on a daily basis. Follow-up in 1  week. Electronic Signature(s) Signed: 09/17/2022 3:00:50 PM By: Fredirick Maudlin MD FACS Entered By: Fredirick Maudlin on 09/17/2022 15:00:50 -------------------------------------------------------------------------------- HxROS Details Patient Name: Date of Service: [name] 09/17/2022 1:15 PM Medical Record Number: [id number] Patient Account Number: [id number] Date of Birth/Sex: Treating RN: 09-16-1946 (76 y.o. F) Primary Care Provider: Deland Pretty Other Clinician: Referring Provider: Treating Provider/Extender: Raylene Everts in Treatment: 17 Information Obtained From Patient Eyes Medical History: Past Medical History Notes: Macular Degeneration Ear/Nose/Mouth/Throat Medical History: Past Medical History Notes: Tinnitus Endocrine Medical HistoryMAFALDA, MCGINNISS ([id number]) [id number] Page 8 of 9 Positive for: Type II Diabetes - Metformin pillsand Ozempic Time with diabetes: 25 years Treated with: Oral agents Blood sugar tested every day: Yes Tested : every day Musculoskeletal Medical History: Positive for: Osteoarthritis Oncologic Medical History: Past Medical History Notes: 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 has had Chemotherapy (stopped) and Radiation -8 treatments left as of 05/19/22 Rectal cancer Psychiatric Medical History: Past Medical History Notes: Hx Depression Immunizations Pneumococcal Vaccine: Received Pneumococcal Vaccination: Yes Received Pneumococcal Vaccination On or After 60th Birthday: Yes Implantable Devices None Hospitalization / Surgery History Type of Hospitalization/Surgery Melanoma excision to left heel;ORIF Left ankle fracture; rectal surgery Family and Social History Former smoker - 1991; Marital Status - Divorced; Alcohol Use: Never; Drug Use: No History; Caffeine Use: Moderate - Diet coke,coffee; Financial Concerns: No; Food, Clothing or Shelter Needs: No; Support System Lacking:  No; Transportation Concerns: No Electronic Signature(s) Signed: 09/17/2022 5:07:18 PM By: Fredirick Maudlin MD FACS Entered By: Fredirick Maudlin on 09/17/2022 14:59:06 -------------------------------------------------------------------------------- SuperBill Details Patient Name: Date of Service: Destiny [name] 09/17/2022 Medical Record Number: [id number] Patient Account Number: [id number] Date of Birth/Sex: Treating RN: 01/15/1946 (76 y.o. F) Primary Care Provider: Deland Pretty Other Clinician: Referring Provider: Treating Provider/Extender: Raylene Everts in Treatment: 17 Diagnosis Coding ICD-10 Codes Code Description [phone] Non-pressure chronic ulcer of left heel and midfoot with fat layer exposed T81.31XS Disruption of external operation (surgical) wound, not elsewhere classified, sequela C43.9 Malignant melanoma of skin, unspecified C20 Malignant neoplasm of rectum E11.9 Type 2 diabetes mellitus without complications E36.62 Personal history of antineoplastic chemotherapy Destiny [name], Destiny [name] ([id number]) [id number] Page 9 of 9 Z92.3 Personal history of irradiation Facility Procedures : CPT4 Code: [id number] Description: [phone] - DEBRIDE WOUND 1ST 20 SQ CM OR < ICD-10 Diagnosis Description L97.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 Modifier: Quantity: 1 Physician Procedures : CPT4 Code Description Modifier [id number] - WC PHYS LEVEL 3 - EST PT 25 ICD-10 Diagnosis Description 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

## 2022-09-23 ENCOUNTER — Ambulatory Visit: Payer: PPO | Attending: Oncology | Admitting: Physical Therapy

## 2022-09-23 DIAGNOSIS — R279 Unspecified lack of coordination: Secondary | ICD-10-CM | POA: Diagnosis not present

## 2022-09-23 DIAGNOSIS — R293 Abnormal posture: Secondary | ICD-10-CM | POA: Diagnosis not present

## 2022-09-23 DIAGNOSIS — M6281 Muscle weakness (generalized): Secondary | ICD-10-CM | POA: Insufficient documentation

## 2022-09-23 NOTE — Therapy (Signed)
OUTPATIENT PHYSICAL THERAPY FEMALE PELVIC TREATMENT   Patient Name: Destiny [name] MRN: [id number] DOB:1946/04/20, 76 y.o., female Today's Date: 09/23/2022  END OF SESSION:  PT End of Session - 09/23/22 1531     Visit Number 2    Date for PT Re-Evaluation 12/11/22    Authorization Type [employer] P[employer]    PT Start Time 1530    PT Stop Time 1608    PT Time Calculation (min) 38 min    Activity Tolerance Patient tolerated treatment well    Behavior During Therapy WFL for tasks assessed/performed             Past Medical History:  Diagnosis Date   Allergy    Ankle dislocation, left, initial encounter 09/23/2017   Arthritis    Blood transfusion    in [place] ([place])    left heel melanoma   Diabetes mellitus    History of kidney stones    Hypothyroidism    Osteoporosis    osteopenia   PONV (postoperative nausea and vomiting)    Sleep apnea    does not use cpap   Thyroid disease    hypothyroidism   Past Surgical History:  Procedure Laterality Date   APPLICATION OF A-CELL OF CHEST/ABDOMEN Left 04/24/2021   Procedure: APPLICATION OF A-CELL LEFT FOOT;  Surgeon: Wallace Going, DO;  Location: [place];  Service: Plastics;  Laterality: Left;   APPLICATION OF A-CELL OF CHEST/ABDOMEN Left 05/22/2021   Procedure: POSSIBLE APPLICATION OF A-CELL;  Surgeon: Wallace Going, DO;  Location: [place];  Service: Plastics;  Laterality: Left;   COLONOSCOPY     EXCISION MELANOMA WITH SENTINEL LYMPH NODE BIOPSY Left 03/26/2021   Procedure: WIDE LOCAL EXCISION MELANOMA LEFT HEEL, SENTINEL LYMPH NODE MAPPING AND BIOPSY;  Surgeon: [name], MD;  Location: [place];  Service: General;  Laterality: Left;   EXTERNAL FIXATION LEG Left 09/24/2017   Procedure: EXTERNAL FIXATION ankle;  Surgeon: Shona Needles, MD;  Location: [place];  Service: Orthopedics;  Laterality: Left;   EXTERNAL FIXATION REMOVAL Left 09/28/2017   Procedure: REMOVAL EXTERNAL FIXATION LEG;  Surgeon:  Shona Needles, MD;  Location: [place];  Service: Orthopedics;  Laterality: Left;   EYE SURGERY Bilateral    cataract   FLEXIBLE SIGMOIDOSCOPY  08/13/2022   Procedure: FLEXIBLE SIGMOIDOSCOPY;  Surgeon: Leighton Ruff, MD;  Location: [facility];  Service: General;;   LAPAROSCOPIC GASTRIC BANDING  2010   MELANOMA EXCISION Left 04/24/2021   Procedure: REEXCISION MARGIN LEFT HEEL MELANOMA;  Surgeon: [name], MD;  Location: [place];  Service: General;  Laterality: Left;   MELANOMA EXCISION Left 05/22/2021   Procedure: RE-EXCISION OF LEFT HEEL MELANOMA;  Surgeon: [name], MD;  Location: [place];  Service: General;  Laterality: Left;   OPEN REDUCTION INTERNAL FIXATION (ORIF) DISTAL RADIAL FRACTURE Right 07/29/2016   Procedure: RIGHT OPEN REDUCTION INTERNAL FIXATION (ORIF) DISTAL RADIAL FRACTURE;  Surgeon: Leanora Cover, MD;  Location: [place];  Service: Orthopedics;  Laterality: Right;   ORIF ANKLE FRACTURE Left 09/28/2017   Procedure: OPEN REDUCTION INTERNAL FIXATION (ORIF) ANKLE FRACTURE;  Surgeon: Shona Needles, MD;  Location: [place];  Service: Orthopedics;  Laterality: Left;   POLYPECTOMY     SKIN DEBRIDEMENT Left 05/22/2021   Procedure: RECONSTRUCTION LEFT HEEL;  Surgeon: Wallace Going, DO;  Location: [place];  Service: Plastics;  Laterality: Left;   SKIN FULL THICKNESS GRAFT Left 04/24/2021   Procedure: RECONSTRUCTION OF LEFT  HEEL;  Surgeon: Wallace Going, DO;  Location: [place];  Service: Plastics;  Laterality: Left;   SKIN FULL THICKNESS GRAFT Left 05/22/2021   Procedure: POSSIBLE SKIN GRAFT;  Surgeon: Wallace Going, DO;  Location: [place];  Service: Plastics;  Laterality: Left;   TOTAL ABDOMINAL HYSTERECTOMY W/ BILATERAL SALPINGOOPHORECTOMY  1980   WISDOM TOOTH EXTRACTION     XI ROBOTIC ASSISTED LOWER ANTERIOR RESECTION N/A 08/13/2022   Procedure: XI ROBOTIC ASSISTED LOWER ANTERIOR RESECTION, WITH INTRAOPERATIVE ASSESSMENT OF PERFUSION USING FIREFLY;   Surgeon: Leighton Ruff, MD;  Location: [facility];  Service: General;  Laterality: N/A;   Patient Active Problem List   Diagnosis Date Noted   Rectal cancer ([place]) 04/07/2022   Melanoma of skin ([place]) 04/09/2021   Acquired trigger finger 01/31/2021   Allergic rhinitis 01/31/2021   Body mass index (BMI) 40.0-44.9, adult ([place]) 01/31/2021   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   Dependence on other enabling machines and devices 01/31/2021   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[place]) 01/31/2021   Kidney stone 01/31/2021   Diverticulosis of colon 01/31/2021   Encounter for general adult medical examination with abnormal findings 01/31/2021   High blood pressure 01/31/2021   History of depression 01/31/2021   Hyperlipidemia 01/31/2021   Morbid obesity ([place]) 01/31/2021   Neuropathy 01/31/2021   Non-toxic multinodular goiter 01/31/2021   Onychomycosis 01/31/2021   Penicillin allergy 01/31/2021   Personal history of (healed) traumatic fracture 01/31/2021   Personal history of colonic polyps 01/31/2021   Pure hypercholesterolemia 01/31/2021   Tinnitus 01/31/2021   Vitamin D deficiency 01/31/2021   Lower limb ulcer, heel or midfoot, left, limited to breakdown of skin ([place]) 04/11/2020   Neck mass 02/07/2020   Trimalleolar fracture of ankle, closed, left, initial encounter 09/24/2017   Tibia/fibula fracture 09/23/2017   Closed left ankle fracture 09/23/2017   Ankle dislocation, left, initial encounter 09/23/2017   Thyroid disease    Sleep apnea    Osteoporosis    Type 2 diabetes mellitus ([place])    Oth intartic fracture of lower end of right radius, init 07/29/2016   Anal and rectal polyp 12/16/2013   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 Denies any straining with bowels. Pt does report she is having much less fecal leakage and can't think of any since eval. Now only wearing one pull up per day just in case but these have been dry. Usually 3-4 one usually larger and 2-3 smaller one (one larger one at night and then others during the day)    Fluid intake: Yes: water - 1 bottle; diet coke per day    PAIN:  Are you having pain? No   PRECAUTIONS: Other: recent rectal CA, no longer having treatments  WEIGHT BEARING RESTRICTIONS: Yes wearing draco shoe with heel out for wound healing, but normal weight bearing  FALLS:  Has patient fallen in last 6 months? No  LIVING ENVIRONMENT: Lives with: lives alone Lives in: House/apartment   OCCUPATION: retired   PLOF: Independent  PATIENT GOALS: to have less fecal leakage  PERTINENT HISTORY:  low anterior resection 08/13/2022, no evidence of metastatic disease, has Lt heel ulcer melanoma biopsy 03/26/2021 (reexcision 04/24/2021 and 05/22/2021, Diabetes, Laparoscopic gastric band surgery,  Sexual abuse: No  BOWEL MOVEMENT: Pain with bowel movement: No Type of bowel movement:Type (Bristol Stool Scale) 5, Frequency multiple bowel movements per day, and Strain No Fully empty rectum: No Leakage: Yes: pt states she has no awareness of leakage and no urge to have a bowel movement. Also has small bowel movements with urinary voids.  And reports she wipes sever times after every bowel movement and uses wet wipes.  Does use destin if needed too.  Pads: Yes: depends - 1-3 per day due to soiling. Sometimes one change per night but other not.  Fiber supplement: No  URINATION: Pain with urination: No Fully empty bladder: Yes:   Stream: Strong and Weak Urgency: No Frequency: right at every 2 hours, usually gets up 3-4x per night to urinate since cancer treatments Leakage:  not usually, but may have small dripping if can't get there quick enough Pads: No  INTERCOURSE: Pain with intercourse:  not active Denied dryness  PREGNANCY: Vaginal deliveries 1 Tearing Yes: "a lot of tearing" C-section deliveries 0 Currently pregnant No  PROLAPSE: None   OBJECTIVE:   DIAGNOSTIC FINDINGS:    COGNITION: Overall cognitive status: Within functional limits for tasks assessed     SENSATION: Light touch: Deficits decreased sensation at rectum, bil neuropathy worse in Lt Proprioception: Appears intact  MUSCLE LENGTH: Bil hamstrings and adductors limited by 25%   POSTURE: rounded shoulders and forward head   LUMBARAROM/PROM:  A/PROM A/PROM  eval  Flexion Limited by 50%  Extension Limited by 25%  Right lateral flexion Limited by 25%  Left lateral flexion Limited by 25%  Right rotation Limited by 50%  Left rotation Limited by 50%   (Blank rows = not tested)  LOWER EXTREMITY ROM:  WFL  LOWER EXTREMITY MMT:  Bil hips grossly 4/5, knees and ankles 5/5  PALPATION:   General  mild TTP in lower abdominal quadrants and fascial restrictions throughout abdominal quadrants.  09/23/22: no TTP today does have tissue restrictions continued in all quadrants however noted tightness around scar sites in upper Lt quad.                 External Perineal Exam pt deferred today  Internal Pelvic Floor pt deferred today  Patient confirms identification and approves PT to assess internal pelvic floor and treatment No  PELVIC MMT:   MMT eval  Vaginal   Internal Anal  Sphincter   External Anal Sphincter   Puborectalis   Diastasis Recti   (Blank rows = not tested)        TONE: pt deferred today  PROLAPSE: pt deferred today  TODAY'S TREATMENT:                                                                                                                              DATE:  09/23/22:  Manual: abdominal massage completed by PT x10, ILU technique x10, "m" technique x10 with pt in supine. Pt denied any pain. Pt educated on abdominal massage at eval with picture but reports when she attempted she wasn't sure she was doing it correctly and wanted to make sure with today's appointment. During manual of this, PT educated on how to complete on herself at home. Pt verbalized understanding, completed one round from rt>lt and demonstrated good technique. Pt denied additional questions Pt also asked about fiber supplements and encouraged to ask MD office about specific recommendations and pt reports she will call the nurse line to ask them   PATIENT EDUCATION:  Education details: voiding mechanics and abdominal massage. Person educated: Patient Education method: Explanation, Demonstration, Tactile cues, Verbal cues, and Handouts Education comprehension: verbalized understanding and returned demonstration  HOME EXERCISE PROGRAM: voiding mechanics and abdominal massage.  ASSESSMENT:  CLINICAL IMPRESSION: Patient session focused on abdominal massage from PT and education for pt to complete this at home to improve peristalsis and decreased bowel frequency. Pt reports she has not had any fecal leakage since eval and very pleased with this, has been using step stool for all voids and thinks this has helped empty better. She still has decreased sensation reported with limited urges to void bowels and decreased feeling of emptying bowels. Pt would benefit from additional PT to further address deficits.    OBJECTIVE IMPAIRMENTS: decreased coordination, decreased  endurance, decreased mobility, decreased strength, increased fascial restrictions, impaired flexibility, improper body mechanics, and postural dysfunction.   ACTIVITY LIMITATIONS: continence  PARTICIPATION LIMITATIONS: community activity  PERSONAL FACTORS: Time since onset of injury/illness/exacerbation and 1 comorbidity: medical history  are also affecting patient's functional outcome.   REHAB POTENTIAL: Good  CLINICAL DECISION MAKING: Stable/uncomplicated  EVALUATION COMPLEXITY: Low   GOALS: Goals reviewed with patient? Yes  SHORT TERM GOALS: Target date: 10/09/22  Pt to be I with HEP.  Baseline: Goal status: INITIAL  2.  Pt will report 3 BMs per per day due to improved muscle tone and coordination with BMs.  Baseline:  Goal status: INITIAL  3.  Pt to report more no more than 2 depend changes per day to demonstrate decreased fecal leakage Baseline:  Goal status: INITIAL  4.  Pt to demonstrate at least 3/5 pelvic floor strength for  improved pelvic stability and decreased strain at pelvic floor/ decrease leakage.  Baseline:  Goal status: INITIAL   LONG TERM GOALS: Target date: 12/11/22  Pt to be I with advanced HEP.  Baseline:  Goal status: INITIAL  2.  Pt will report her BMs are complete due to improved bowel habits and evacuation techniques.  Baseline:  Goal status: INITIAL  3.  Pt to report more no more than 1 depend changes per day to demonstrate decreased fecal leakage Baseline:  Goal status: INITIAL  4.   Pt to demonstrate at least 4/5 pelvic floor strength and ability to fully relax for improved pelvic stability and decreased strain at pelvic floor/ decrease leakage.  Baseline:  Goal status: INITIAL  5.  Pt to be I with abdominal massage, voiding mechanics, and breathing mechanics to improve bowel habits.  Baseline:  Goal status: INITIAL  6.  Pt to demonstrate improved coordination with breathing mechanics and pelvic floor activation with functional  exercises such as 10# squat for improved pelvic stability and decreased leakage.  Baseline:  Goal status: INITIAL  PLAN:  PT FREQUENCY: 1x/week  PT DURATION:  6 sessions  PLANNED INTERVENTIONS: Therapeutic exercises, Therapeutic activity, Neuromuscular re-education, Patient/Family education, Self Care, Joint mobilization, Electrical stimulation, Spinal mobilization, Cryotherapy, scar mobilization, Taping, Biofeedback, and Manual therapy  PLAN FOR NEXT SESSION: internal if pt consents, abdominal massage, hip and spine stretching, core and hip strengthening, voiding mechanics, pelvic floor and breathing coordination   Stacy Gardner, PT, DPT 12/12/234:12 PM

## 2022-10-01 ENCOUNTER — Encounter (HOSPITAL_BASED_OUTPATIENT_CLINIC_OR_DEPARTMENT_OTHER): Payer: PPO | Admitting: General Surgery

## 2022-10-01 DIAGNOSIS — E11621 Type 2 diabetes mellitus with foot ulcer: Secondary | ICD-10-CM | POA: Diagnosis not present

## 2022-10-01 DIAGNOSIS — T8131XA Disruption of external operation (surgical) wound, not elsewhere classified, initial encounter: Secondary | ICD-10-CM | POA: Diagnosis not present

## 2022-10-02 NOTE — Progress Notes (Signed)
EVALEEN, SANT ([id number]) [id number] Page 1 of 9 Visit Report for 10/01/2022 Chief Complaint Document Details Patient Name: Date of Service: Destiny [name], Destiny [name] 10/01/2022 2:45 PM Medical Record Number: [id number] Patient Account Number: [id number] Date of Birth/Sex: Treating RN: 1946-04-03 (76 y.o. F) Primary Care Provider: Deland Pretty Other Clinician: Referring Provider: Treating Provider/Extender: Raylene Everts in Treatment: 19 Information Obtained from: Patient Chief Complaint Patient presents to the wound care center with open non-healing surgical wound(s) Electronic Signature(s) Signed: 10/01/2022 3:54:32 PM By: Fredirick Maudlin MD FACS Entered By: Fredirick Maudlin on 10/01/2022 15:54:32 -------------------------------------------------------------------------------- Debridement Details Patient Name: Date of Service: Destiny [name] 10/01/2022 2:45 PM Medical Record Number: [id number] Patient Account Number: [id number] Date of Birth/Sex: Treating RN: 09-Feb-1946 (76 y.o. Iver Nestle, Smyer Primary Care Provider: Deland Pretty Other Clinician: Referring Provider: Treating Provider/Extender: Raylene Everts in Treatment: 19 Debridement Performed for Assessment: Wound #1 Left Calcaneus Performed By: Physician Fredirick Maudlin, MD Debridement Type: Debridement Level of Consciousness (Pre-procedure): Awake and Alert Pre-procedure Verification/Time Out Yes - 15:23 Taken: Start Time: 15:24 Pain Control: Lidocaine 5% topical ointment T Area Debrided (L x W): otal 0.8 (cm) x 0.5 (cm) = 0.4 (cm) Tissue and other material debrided: Non-Viable, Callus, Skin: Epidermis Level: Skin/Epidermis Debridement Description: Selective/Open Wound Instrument: Curette Bleeding: Minimum Hemostasis Achieved: Pressure Response to Treatment: Procedure was tolerated well Level of Consciousness (Post- Awake and  Alert procedure): Post Debridement Measurements of Total Wound Length: (cm) 0.8 Width: (cm) 0.5 Depth: (cm) 0.1 Volume: (cm) 0.031 Character of Wound/Ulcer Post Debridement: Requires Further Debridement Post Procedure Diagnosis Same as Pre-procedure Destiny [name], Destiny [name] ([id number]) [id number] Page 2 of 9 Notes Scribed for Dr. Celine Ahr by Blanche East, RN Electronic Signature(s) Signed: 10/01/2022 4:53:29 PM By: Fredirick Maudlin MD FACS Signed: 10/02/2022 4:49:39 PM By: Blanche East RN Entered By: Blanche East on 10/01/2022 15:31:29 -------------------------------------------------------------------------------- HPI Details Patient Name: Date of Service: Destiny [name]. 10/01/2022 2:45 PM Medical Record Number: [id number] Patient Account Number: [id number] Date of Birth/Sex: Treating RN: 1946-01-04 (76 y.o. 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 After the final reexcision, the wound had an ACell graft applied. She reports that she had good closure of skin over the site. Earlier this year, at the end of May, however, she was diagnosed with rectal cancer and is currently undergoing neoadjuvant therapy (chemotherapy and radiation) in anticipation of future surgery. She reports that about 2 weeks ago, she noted that her skin coverage over the heel was beginning to break down. Her oncologist felt that perhaps the Xeloda was contributing to wound failure and stopped this medication. She continues to receive neoadjuvant radiation therapy, however. She saw plastic surgery last week, and they referred her to the wound care center for further evaluation and management.  She is a type II diabetic, well controlled, with the most recent A1c available to me being 5.9%. ABIs done 1 year ago were normal. On the patient's left heel, there is an irregular wound with some skin maceration. The fat layer is exposed and there is slough over the entire surface. Outside of the area of maceration, the skin is in good condition. No erythema, induration, or odor. No concern for infection. 05/26/2022: The wound is smaller today and is extremely  clean, without any slough buildup. The periwound skin is in much better condition and is no longer macerated. She is scheduled to complete her radiation treatment on Wednesday. 06/03/2022: The wound continues to contract. There is just a little bit of eschar and callus accumulation. The surface is clean. She has completed all of her neoadjuvant therapy and is awaiting surgery in November. 06/10/2022: The wound is smaller by about a centimeter in all dimensions. She does have a little bit of eschar near the Achilles portion of the wound. The wound surface itself is robust and healthy-appearing. 06/24/2022: The wound continues to contract. There is just a small portion open in the center. She does have some dry skin and periwound callus circumferentially. No concern for infection. 07/08/2022: The wound is down to just a pinhole. There is a little bit of eschar on the surface. No concern for infection. 07/22/2022: Her wound is healed. 08/08/2022: Unfortunately, it seems that when her wound was being dressed at her last visit, the nurse applying the dressing pulled some dead skin off and reopened to the wound. I am not sure why this was not brought to my attention, but the patient has been caring for the wound at home with her supplies from her previous admission. She has been wearing her heel off loader and using Hydrofera Blue. There is some eschar accumulation, but no erythema, induration, or other concern for infection. Her colorectal surgery is  scheduled for next Wednesday. 09/03/2022: Ms. Fennema returns after undergoing her colon surgery. She has been dressing the wound at home with [facility], but has not been wearing her heel cup. The wound is more open today with a lot of periwound callus, dry skin, and a light layer of slough. No overt signs of infection. 09/17/2022: The wound is little bit smaller today. There is some periwound tissue maceration. She says she has been wearing her heel cup more regularly. 10/01/2022: The wound looks a little smaller in terms of the open dimensions. There is more fresh epithelium present. The periwound skin is actually a bit dry and cracked with peeling skin. Electronic Signature(s) Signed: 10/01/2022 3:55:29 PM By: Fredirick Maudlin MD FACS Entered By: Fredirick Maudlin on 10/01/2022 15:55:28 [place], Lurene Shadow ([id number]) [id number] Page 3 of 9 -------------------------------------------------------------------------------- Physical Exam Details Patient Name: Date of Service: Destiny [name], Destiny [name] 10/01/2022 2:45 PM Medical Record Number: [id number] Patient Account Number: [id number] Date of Birth/Sex: Treating RN: January 08, 1946 (76 y.o. F) Primary Care Provider: Deland Pretty Other Clinician: Referring Provider: Treating Provider/Extender: Raylene Everts in Treatment: 19 Constitutional Slightly hypertensive. . . . No acute distress. Respiratory Normal work of breathing on room air. Notes 10/01/2022: The wound looks a little smaller in terms of the open dimensions. There is more fresh epithelium present. The periwound skin is actually a bit dry and cracked with peeling skin. Electronic Signature(s) Signed: 10/01/2022 3:57:07 PM By: Fredirick Maudlin MD FACS Entered By: Fredirick Maudlin on 10/01/2022 15:57:07 -------------------------------------------------------------------------------- Physician Orders Details Patient Name: Date of  Service: Destiny [name] 10/01/2022 2:45 PM Medical Record Number: [id number] Patient Account Number: [id number] Date of Birth/Sex: Treating RN: 07/04/1946 (76 y.o. [name] Primary Care Provider: Deland Pretty Other Clinician: Referring Provider: Treating Provider/Extender: Raylene Everts in Treatment: [phone] Verbal / Phone Orders: No Diagnosis Coding ICD-10 Coding Code Description [phone] Non-pressure chronic ulcer of left heel and midfoot with fat layer exposed T81.31XS Disruption of external operation (surgical) wound, not elsewhere classified, sequela C43.9 Malignant  melanoma of skin, unspecified C20 Malignant neoplasm of rectum E11.9 Type 2 diabetes mellitus without complications O96.29 Personal history of antineoplastic chemotherapy Z92.3 Personal history of irradiation Follow-up Appointments ppointment in 2 weeks. - Dr. Celine Ahr RM 1 Return A Anesthetic Wound #1 Left Calcaneus (In clinic) Topical Lidocaine 4% applied to wound bed Bathing/ Shower/ Hygiene Other Bathing/Shower/Hygiene Orders/Instructions: - Change wound dressing after bathing Edema Control - Lymphedema / SCD / Other Avoid standing for long periods of time. Moisturize legs daily. Off-Loading Other: - Please wear off-loading shoe on left foot to keep pressure off the heel Wound Treatment Wound #1 - Calcaneus Wound Laterality: Left KALAN, RINN ([id number]) [id number] Page 4 of 9 Cleanser: Normal Saline (DME) (Generic) 1 x Per Day/30 Days Discharge Instructions: Cleanse the wound with Normal Saline prior to applying a clean dressing using gauze sponges, not tissue or cotton balls. Cleanser: Soap and Water 1 x Per Day/30 Days Discharge Instructions: May shower and wash wound with dial antibacterial soap and water prior to dressing change. Cleanser: Wound Cleanser 1 x Per Day/30 Days Discharge Instructions: Cleanse the wound with wound cleanser  prior to applying a clean dressing using gauze sponges, not tissue or cotton balls. Cleanser: [place] Ancillary Kit - 15 Day Supply (DME) (Generic) 1 x Per Day/30 Days Discharge Instructions: Use supplies as instructed; Kit contains: (15) Saline Bullets; (15) 3x3 Gauze; 15 pr Gloves Prim Dressing: Promogran Prisma Matrix, 4.34 (sq in) (silver collagen) (DME) (Dispense As Written) 1 x Per Day/30 Days ary Discharge Instructions: Moisten collagen with saline or hydrogel Secondary Dressing: Woven Gauze Sponge, Non-Sterile 4x4 in (DME) (Generic) 1 x Per Day/30 Days Discharge Instructions: Apply over primary dressing as directed. Secondary Dressing: Zetuvit Plus 4x8 in (DME) (Generic) 1 x Per Day/30 Days Discharge Instructions: Apply over primary dressing as directed. Secured With: [employer], 4.5x3.1 (in/yd) (DME) (Generic) 1 x Per Day/30 Days Discharge Instructions: Secure with Kerlix as directed. Secured With: 29M Medipore Public affairs consultant Surgical T 2x10 (in/yd) (DME) (Generic) 1 x Per Day/30 Days ape Discharge Instructions: Secure with tape as directed. Electronic Signature(s) Signed: 10/02/2022 7:39:35 AM By: Fredirick Maudlin MD FACS Signed: 10/02/2022 4:49:39 PM By: Blanche East RN Previous Signature: 10/01/2022 4:53:29 PM Version By: Fredirick Maudlin MD FACS Entered By: Blanche East on 10/01/2022 16:55:20 -------------------------------------------------------------------------------- Problem List Details Patient Name: Date of Service: Destiny [name], Destiny [name] 10/01/2022 2:45 PM Medical Record Number: [id number] Patient Account Number: [id number] Date of Birth/Sex: Treating RN: [birth year] (76 y.o. F) Primary Care Provider: Deland Pretty Other Clinician: Referring Provider: Treating Provider/Extender: Raylene Everts in Treatment: 19 Active Problems ICD-10 Encounter Code Description Active Date MDM Diagnosis L97.422 Non-pressure chronic ulcer of left heel and  midfoot with fat layer exposed 05/19/2022 No Yes T81.31XS Disruption of external operation (surgical) wound, not elsewhere classified, 05/19/2022 No Yes sequela C43.9 Malignant melanoma of skin, unspecified 05/19/2022 No Yes C20 Malignant neoplasm of rectum 05/19/2022 No Yes E11.9 Type 2 diabetes mellitus without complications 03/16/4033 No Yes NKENGE, SONNTAG ([id number]) [id number] Page 5 of 9 Z92.21 Personal history of antineoplastic chemotherapy 05/19/2022 No Yes Z92.3 Personal history of irradiation 05/19/2022 No Yes Inactive Problems Resolved Problems Electronic Signature(s) Signed: 10/01/2022 3:54:17 PM By: Fredirick Maudlin MD FACS Entered By: Fredirick Maudlin on 10/01/2022 15:54:17 -------------------------------------------------------------------------------- Progress Note Details Patient Name: Date of Service: Destiny [name] 10/01/2022 2:45 PM Medical Record Number: [id number] Patient Account Number: [id number] Date of Birth/Sex: Treating RN: 1946-01-06 (76 y.o. 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 After the final reexcision, the wound had an ACell graft applied. She reports that she had good closure of skin over the site. Earlier this year, at the end of May, however, she was diagnosed with rectal cancer and is currently undergoing neoadjuvant therapy (chemotherapy and radiation) in anticipation of future surgery. She reports that about 2 weeks ago, she noted that her skin coverage over the heel was beginning to break down. Her  oncologist felt that perhaps the Xeloda was contributing to wound failure and stopped this medication. She continues to receive neoadjuvant radiation therapy, however. She saw plastic surgery last week, and they referred her to the wound care center for further evaluation and management. She is a type II diabetic, well controlled, with the most recent A1c available to me being 5.9%. ABIs done 1 year ago were normal. On the patient's left heel, there is an irregular wound with some skin maceration. The fat layer is exposed and there is slough over the entire surface. Outside of the area of maceration, the skin is in good condition. No erythema, induration, or odor. No concern for infection. 05/26/2022: The wound is smaller today and is extremely clean, without any slough buildup. The periwound skin is in much better condition and is no longer macerated. She is scheduled to complete her radiation treatment on Wednesday. 06/03/2022: The wound continues to contract. There is just a little bit of eschar and callus accumulation. The surface is clean. She has completed all of her neoadjuvant therapy and is awaiting surgery in November. 06/10/2022: The wound is smaller by about a centimeter in all dimensions. She does have a little bit of eschar near the Achilles portion of the wound. The wound surface itself is robust and healthy-appearing. 06/24/2022: The wound continues to contract. There is just a small portion open in the center. She does have some dry skin and periwound callus circumferentially. No concern for infection. 07/08/2022: The wound is down to just a pinhole. There is a little bit of eschar on the surface. No concern for infection. 07/22/2022: Her wound is healed. 08/08/2022: Unfortunately, it seems that when her wound was being dressed at her last visit, the nurse applying the dressing pulled some dead skin off and reopened to the wound. I am not sure why this was not brought to my attention,  but the patient has been caring for the wound at home with her supplies from her previous admission. She has been wearing her heel off loader and using Hydrofera Blue. There is some eschar accumulation, but no erythema, induration, or other concern for infection. Her colorectal surgery is scheduled for next Wednesday. 09/03/2022: Ms. Tates returns after undergoing her colon surgery. She has been dressing the wound at home with [facility], but has not been wearing her heel cup. The wound is more open today with a lot of periwound callus, dry skin, and a light layer of slough. No overt signs of infection. 09/17/2022: The wound is little bit smaller today. There is some periwound tissue maceration. She says she has been wearing her heel cup more regularly. Destiny [name], Destiny [name] ([id number]) [id number] Page 6 of 9 10/01/2022: The wound looks  a little smaller in terms of the open dimensions. There is more fresh epithelium present. The periwound skin is actually a bit dry and cracked with peeling skin. Patient History Information obtained from Patient. Social History Former smoker - 1991, Marital Status - Divorced, Alcohol Use - Never, Drug Use - No History, Caffeine Use - Moderate - Diet coke,coffee. Medical History Endocrine Patient has history of Type II Diabetes - Metformin pillsand Ozempic Musculoskeletal Patient has history of Osteoarthritis Hospitalization/Surgery History - Melanoma excision to left heel;ORIF Left ankle fracture;Marland Kitchen - rectal surgery. Medical A Surgical History Notes nd Eyes Macular Degeneration Ear/Nose/Mouth/Throat Tinnitus Oncologic 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 has had Chemotherapy (stopped) and Radiation -8 treatments left as of 05/19/22 Rectal cancer Psychiatric Hx Depression Objective Constitutional Slightly hypertensive. No acute distress. Vitals Time Taken: 2:40 AM, Height: 63 in, Temperature: 97.8 F, Pulse: 73 bpm, Respiratory Rate: 20  breaths/min, Blood Pressure: 140/67 mmHg, Capillary Blood Glucose: 102 mg/dl. Respiratory Normal work of breathing on room air. General Notes: 10/01/2022: The wound looks a little smaller in terms of the open dimensions. There is more fresh epithelium present. The periwound skin is actually a bit dry and cracked with peeling skin. Integumentary (Hair, Skin) Wound #1 status is Open. Original cause of wound was Hematoma. The date acquired was: 05/05/2022. The wound has been in treatment 19 weeks. The wound is located on the Left Calcaneus. The wound measures 0.8cm length x 0.5cm width x 0.1cm depth; 0.314cm^2 area and 0.031cm^3 volume. There is Fat Layer (Subcutaneous Tissue) exposed. There is no tunneling or undermining noted. There is a medium amount of sanguinous drainage noted. The wound margin is flat and intact. There is large (67-100%) red, pink granulation within the wound bed. There is a small (1-33%) amount of necrotic tissue within the wound bed including Adherent Slough. The periwound skin appearance had no abnormalities noted for color. The periwound skin appearance exhibited: Callus, Dry/Scaly. The periwound skin appearance did not exhibit: Maceration. Periwound temperature was noted as No Abnormality. Assessment Active Problems ICD-10 Non-pressure chronic ulcer of left heel and midfoot with fat layer exposed Disruption of external operation (surgical) wound, not elsewhere classified, sequela Malignant melanoma of skin, unspecified Malignant neoplasm of rectum Type 2 diabetes mellitus without complications Personal history of antineoplastic chemotherapy Personal history of irradiation Procedures Wound #1 Pre-procedure diagnosis of Wound #1 is a Trauma, Other located on the Left Calcaneus . There was a Selective/Open Wound Skin/Epidermis Debridement with Destiny [name], Destiny [name] ([id number]) [id number] Page 7 of 9 a total area of 0.4 sq cm performed by  Fredirick Maudlin, MD. With the following instrument(s): Curette to remove Non-Viable tissue/material. Material removed includes Callus and Skin: Epidermis and after achieving pain control using Lidocaine 5% topical ointment. No specimens were taken. A time out was conducted at 15:23, prior to the start of the procedure. A Minimum amount of bleeding was controlled with Pressure. The procedure was tolerated well. Post Debridement Measurements: 0.8cm length x 0.5cm width x 0.1cm depth; 0.031cm^3 volume. Character of Wound/Ulcer Post Debridement requires further debridement. Post procedure Diagnosis Wound #1: Same as Pre-Procedure General Notes: Scribed for Dr. Celine Ahr by Blanche East, RN. Plan Follow-up Appointments: Return Appointment in 2 weeks. - Dr. Celine Ahr RM 1 Anesthetic: Wound #1 Left Calcaneus: (In clinic) Topical Lidocaine 4% applied to wound bed Bathing/ Shower/ Hygiene: Other Bathing/Shower/Hygiene Orders/Instructions: - Change wound dressing after bathing Edema Control - Lymphedema / SCD / Other: Avoid standing for long periods of time. Moisturize legs daily. Off-Loading: Other: - Please  wear off-loading shoe on left foot to keep pressure off the heel WOUND #1: - Calcaneus Wound Laterality: Left Cleanser: Soap and Water 1 x Per Day/30 Days Discharge Instructions: May shower and wash wound with dial antibacterial soap and water prior to dressing change. Cleanser: Wound Cleanser 1 x Per Day/30 Days Discharge Instructions: Cleanse the wound with wound cleanser prior to applying a clean dressing using gauze sponges, not tissue or cotton balls. Prim Dressing: SILVERCEL Alginate Dressing, 2x2 (in/in) (Dispense As Written) 1 x Per Day/30 Days ary Discharge Instructions: apply to wound bed Secondary Dressing: ALLEVYN Heel 4 1/2in x 5 1/2in / 10.5cm x 13.5cm 1 x Per Day/30 Days Discharge Instructions: Apply over primary dressing as directed. Secondary Dressing: Woven Gauze Sponge,  Non-Sterile 4x4 in 1 x Per Day/30 Days Discharge Instructions: Apply over primary dressing as directed. Secured With: Elastic Bandage 4 inch (ACE bandage) 1 x Per Day/30 Days Discharge Instructions: Secure with ACE bandage as directed. Secured With: [employer], 4.5x3.1 (in/yd) (Generic) 1 x Per Day/30 Days Discharge Instructions: Secure with Kerlix as directed. Secured With: 63M Medipore Public affairs consultant Surgical T 2x10 (in/yd) (Generic) 1 x Per Day/30 Days ape Discharge Instructions: Secure with tape as directed. 10/01/2022: The wound looks a little smaller in terms of the open dimensions. There is more fresh epithelium present. The periwound skin is actually a bit dry and cracked with peeling skin. I used a curette to debride callus, dry skin, and a bit of slough from the wound. I think we have gone too far in the other direction in terms of drying out the area. I am going to use Prisma silver collagen on the wound itself and then we will use an absorbent dressing like Zetuvit to try and prevent moisture accumulation on the periwound skin. Continue to offload. Follow-up in 2 weeks. Electronic Signature(s) Signed: 10/01/2022 3:58:27 PM By: Fredirick Maudlin MD FACS Entered By: Fredirick Maudlin on 10/01/2022 15:58:27 -------------------------------------------------------------------------------- HxROS Details Patient Name: Date of Service: Destiny Dark F. 10/01/2022 2:45 PM Medical Record Number: [id number] Patient Account Number: [id number] Date of Birth/Sex: Treating RN: 08/21/1946 (76 y.o. F) Primary Care Provider: Deland Pretty Other Clinician: Referring Provider: Treating Provider/Extender: Raylene Everts in Treatment: 19 Information Obtained From Patient Eyes Medical History: Past Medical History Notes: NORMA, MONTEMURRO ([id number]) [id number] Page 8 of 9 Macular Degeneration Ear/Nose/Mouth/Throat Medical History: Past  Medical History Notes: Tinnitus Endocrine Medical History: Positive for: Type II Diabetes - Metformin pillsand Ozempic Time with diabetes: 25 years Treated with: Oral agents Blood sugar tested every day: Yes Tested : every day Musculoskeletal Medical History: Positive for: Osteoarthritis Oncologic Medical History: Past Medical History Notes: 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 has had Chemotherapy (stopped) and Radiation -8 treatments left as of 05/19/22 Rectal cancer Psychiatric Medical History: Past Medical History Notes: Hx Depression Immunizations Pneumococcal Vaccine: Received Pneumococcal Vaccination: Yes Received Pneumococcal Vaccination On or After 60th Birthday: Yes Implantable Devices None Hospitalization / Surgery History Type of Hospitalization/Surgery Melanoma excision to left heel;ORIF Left ankle fracture; rectal surgery Family and Social History Former smoker - 1991; Marital Status - Divorced; Alcohol Use: Never; Drug Use: No History; Caffeine Use: Moderate - Diet coke,coffee; Financial Concerns: No; Food, Clothing or Shelter Needs: No; Support System Lacking: No; Transportation Concerns: No Electronic Signature(s) Signed: 10/01/2022 4:53:29 PM By: Fredirick Maudlin MD FACS Entered By: Fredirick Maudlin on 10/01/2022 15:55:34 -------------------------------------------------------------------------------- SuperBill Details Patient Name: Date of Service: Destiny [name] 10/01/2022 Medical Record Number: [id number] Patient  Account Number: [id number] Date of Birth/Sex: Treating RN: Sep 04, 1946 (76 y.o. F) Primary Care Provider: Deland Pretty Other Clinician: Referring Provider: Treating Provider/Extender: Raylene Everts in Treatment: [address] 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 Q11.94 Personal history of antineoplastic chemotherapy Z92.3 Personal history of irradiation Facility Procedures : CPT4 Code: [id number] Description: 81856 - DEBRIDE WOUND 1ST 20 SQ CM OR < ICD-10 Diagnosis Description L97.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 Modifier: Quantity: 1 Physician Procedures : CPT4 Code Description Modifier [id number] - WC PHYS LEVEL 3 - EST PT 25 ICD-10 Diagnosis Description L97.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

## 2022-10-02 NOTE — Progress Notes (Signed)
Destiny [name] ([id number]) [id number] Page 1 of 7 Visit Report for 10/01/2022 Arrival Information Details Patient Name: Date of Service: Destiny [name], Destiny [name] 10/01/2022 2:45 PM Medical Record Number: [id number] Patient Account Number: [id number] Date of Birth/Sex: Treating RN: [birth year] (76 y.o. F) Primary Care Merica Prell: Deland Pretty Other Clinician: Referring Waneta Fitting: Treating Katiya Fike/Extender: Raylene Everts in Treatment: 5 Visit Information History Since Last Visit All ordered tests and consults were completed: No Patient Arrived: Ambulatory Added or deleted any medications: No Arrival Time: 14:40 Any new allergies or adverse reactions: No Accompanied By: self Had a fall or experienced change in No Transfer Assistance: None activities of daily living that may affect Patient Identification Verified: Yes risk of falls: Secondary Verification Process Completed: Yes Signs or symptoms of abuse/neglect since last visito No Patient Requires Transmission-Based Precautions: No Hospitalized since last visit: No Patient Has Alerts: No Implantable device outside of the clinic excluding No cellular tissue based products placed in the center since last visit: Pain Present Now: No Electronic Signature(s) Signed: 10/01/2022 4:01:13 PM By: Worthy Rancher Entered By: Worthy Rancher on 10/01/2022 14:40:28 -------------------------------------------------------------------------------- Encounter Discharge Information Details Patient Name: Date of Service: Destiny [name] 10/01/2022 2:45 PM Medical Record Number: [id number] Patient Account Number: [id number] Date of Birth/Sex: Treating RN: 1946/05/09 (76 y.o. Destiny [name] Primary Care Almond Fitzgibbon: Deland Pretty Other Clinician: Referring Destiny [name]: Treating Loghan Kurtzman/Extender: Raylene Everts in Treatment: 19 Encounter Discharge Information Items Post Procedure  Vitals Discharge Condition: Stable Temperature (F): 97.8 Ambulatory Status: Ambulatory Pulse (bpm): 73 Discharge Destination: Home Respiratory Rate (breaths/min): 20 Transportation: Private Auto Blood Pressure (mmHg): 140/67 Accompanied By: self Schedule Follow-up Appointment: Yes Clinical Summary of Care: Electronic Signature(s) Signed: 10/01/2022 4:56:38 PM By: Blanche East RN Entered By: Blanche East on 10/01/2022 16:56:38 Kuc, Lurene Shadow ([id number]) [id number] Page 2 of 7 -------------------------------------------------------------------------------- Lower Extremity Assessment Details Patient Name: Date of Service: Destiny [name], Destiny [name] 10/01/2022 2:45 PM Medical Record Number: [id number] Patient Account Number: [id number] Date of Birth/Sex: Treating RN: 1946-06-07 (76 y.o. Destiny [name] Primary Care Kaileb Monsanto: Deland Pretty Other Clinician: Referring Destiny [name]: Treating Destiny [name]/Extender: Raylene Everts in Treatment: 19 Edema Assessment Assessed: [name]: No] Patrice Paradise: No] Edema: [Left: N] [Right: o] Calf Left: Right: Point of Measurement: 29 cm From Medial Instep 34 cm Ankle Left: Right: Point of Measurement: 10 cm From Medial Instep 21.4 cm Vascular Assessment Pulses: Dorsalis Pedis Palpable: [Left:Yes] Electronic Signature(s) Signed: 10/02/2022 4:49:39 PM By: Blanche East RN Entered By: Blanche East on 10/01/2022 15:17:31 -------------------------------------------------------------------------------- Multi Wound Chart Details Patient Name: Date of Service: Destiny [name] 10/01/2022 2:45 PM Medical Record Number: [id number] Patient Account Number: [id number] Date of Birth/Sex: Treating RN: [birth year] (76 y.o. F) Primary Care Destiny [name]: Deland Pretty Other Clinician: Referring Destiny [name]: Treating Destiny [name]/Extender: Raylene Everts in Treatment: 19 Vital Signs Height(in):  63 Capillary Blood Glucose(mg/dl): 102 Weight(lbs): Pulse(bpm): 73 Body Mass Index(BMI): Blood Pressure(mmHg): 140/67 Temperature(F): 97.8 Respiratory Rate(breaths/min): 20 [1:Photos:] [N/A:N/A] Left Calcaneus N/A N/A Wound Location: Hematoma N/A N/A Wounding Event: Trauma, Other N/A N/A Primary Etiology: Type II Diabetes, Osteoarthritis N/A N/A Comorbid History: 05/05/2022 N/A N/A Date Acquired: 36 N/A N/A Weeks of Treatment: Open N/A N/A Wound Status: No N/A N/A Wound Recurrence: 0.8x0.5x0.1 N/A N/A Measurements L x W x D (cm) 0.314 N/A N/A A (cm) : rea 0.031 N/A N/A Volume (cm) : 95.40% N/A N/A % Reduction in A rea: 97.70% N/A N/A % Reduction in Volume: Full Thickness Without  Exposed N/A N/A Classification: Support Structures Medium N/A N/A Exudate A mount: Sanguinous N/A N/A Exudate Type: red N/A N/A Exudate Color: Flat and Intact N/A N/A Wound Margin: Large (67-100%) N/A N/A Granulation A mount: Red, Pink N/A N/A Granulation Quality: Small (1-33%) N/A N/A Necrotic A mount: Fat Layer (Subcutaneous Tissue): Yes N/A N/A Exposed Structures: Fascia: No Tendon: No Muscle: No Joint: No Bone: No Small (1-33%) N/A N/A Epithelialization: Debridement - Selective/Open Wound N/A N/A Debridement: Pre-procedure Verification/Time Out 15:23 N/A N/A Taken: Lidocaine 5% topical ointment N/A N/A Pain Control: Callus N/A N/A Tissue Debrided: Skin/Epidermis N/A N/A Level: 0.4 N/A N/A Debridement A (sq cm): rea Curette N/A N/A Instrument: Minimum N/A N/A Bleeding: Pressure N/A N/A Hemostasis A chieved: Procedure was tolerated well N/A N/A Debridement Treatment Response: 0.8x0.5x0.1 N/A N/A Post Debridement Measurements L x W x D (cm) 0.031 N/A N/A Post Debridement Volume: (cm) Callus: Yes N/A N/A Periwound Skin Texture: Dry/Scaly: Yes N/A N/A Periwound Skin Moisture: Maceration: No No Abnormalities Noted N/A N/A Periwound Skin Color: No  Abnormality N/A N/A Temperature: Debridement N/A N/A Procedures Performed: Treatment Notes Electronic Signature(s) Signed: 10/01/2022 3:54:24 PM By: Fredirick Maudlin MD FACS Entered By: Fredirick Maudlin on 10/01/2022 15:54:24 -------------------------------------------------------------------------------- Multi-Disciplinary Care Plan Details Patient Name: Date of Service: Destiny [name] 10/01/2022 2:45 PM Medical Record Number: [id number] Patient Account Number: [id number] Date of Birth/Sex: Treating RN: 08-13-1946 (76 y.o. Destiny [name] Primary Care Zorawar Strollo: Deland Pretty Other Clinician: Referring Bruchy Mikel: Treating Eliazer Hemphill/Extender: Raylene Everts in Treatment: [place] reviewed with physician Active Inactive Necrotic Tissue RUWAYDA, CURET ([id number]) [id number] Page 4 of 7 Nursing Diagnoses: Impaired tissue integrity related to necrotic/devitalized tissue Knowledge deficit related to management of necrotic/devitalized tissue Goals: Necrotic/devitalized tissue will be minimized in the wound bed Date Initiated: 08/08/2022 Target Resolution Date: 09/19/2022 Goal Status: Active Patient/caregiver will verbalize understanding of reason and process for debridement of necrotic tissue Date Initiated: 08/08/2022 Target Resolution Date: 09/19/2022 Goal Status: Active Interventions: Assess patient pain level pre-, during and post procedure and prior to discharge Provide education on necrotic tissue and debridement process Treatment Activities: Apply topical anesthetic as ordered : 08/08/2022 Notes: Wound/Skin Impairment Nursing Diagnoses: Impaired tissue integrity Goals: Patient/caregiver will verbalize understanding of skin care regimen Date Initiated: 09/03/2022 Target Resolution Date: 10/01/2022 Goal Status: Active Interventions: Assess ulceration(s) every visit Treatment Activities: Skin  care regimen initiated : 05/19/2022 Notes: Electronic Signature(s) Signed: 10/02/2022 4:49:39 PM By: Blanche East RN Entered By: Blanche East on 10/01/2022 15:18:12 -------------------------------------------------------------------------------- Pain Assessment Details Patient Name: Date of Service: Destiny [name] 10/01/2022 2:45 PM Medical Record Number: [id number] Patient Account Number: [id number] Date of Birth/Sex: Treating RN: 1946/02/07 (76 y.o. F) Primary Care Braian Tijerina: Deland Pretty Other Clinician: Referring Tranice Laduke: Treating Syvilla Martin/Extender: Raylene Everts in Treatment: 19 Active Problems Location of Pain Severity and Description of Pain Patient Has Paino No Site Locations TANGELIA, SANSON ([id number]) [id number] Page 5 of 7 Pain Management and Medication Current Pain Management: Electronic Signature(s) Signed: 10/01/2022 4:01:13 PM By: Worthy Rancher Entered By: Worthy Rancher on 10/01/2022 14:41:24 -------------------------------------------------------------------------------- Patient/Caregiver Education Details Patient Name: Date of Service: Destiny [name] 12/20/2023andnbsp2:45 PM Medical Record Number: [id number] Patient Account Number: [id number] Date of Birth/Gender: Treating RN: Apr 12, 1946 (76 y.o. Destiny [name] Primary Care Physician: Deland Pretty Other Clinician: Referring Physician: Treating Physician/Extender: Raylene Everts in Treatment: 19 Education Assessment Education Provided To: Patient Education Topics Provided Wound Debridement: Methods: Explain/Verbal Responses:  Reinforcements needed, State content correctly Wound/Skin Impairment: Methods: Explain/Verbal Responses: Reinforcements needed, State content correctly Electronic Signature(s) Signed: 10/02/2022 4:49:39 PM By: Blanche East RN Entered By: Blanche East on 10/01/2022  15:18:27 -------------------------------------------------------------------------------- Wound Assessment Details Patient Name: Date of Service: Destiny Dark F. 10/01/2022 2:45 PM Destiny [name] ([id number]) [id number] Page 6 of 7 Medical Record Number: [id number] Patient Account Number: [id number] Date of Birth/Sex: Treating RN: November 12, 1945 (76 y.o. F) Primary Care Airik Goodlin: Deland Pretty Other Clinician: Referring Jacklin Zwick: Treating Cletus Paris/Extender: Raylene Everts in Treatment: 19 Wound Status Wound Number: 1 Primary Etiology: Trauma, Other Wound Location: Left Calcaneus Wound Status: Open Wounding Event: Hematoma Comorbid History: Type II Diabetes, Osteoarthritis Date Acquired: 05/05/2022 Weeks Of Treatment: 19 Clustered Wound: No Photos Wound Measurements Length: (cm) 0.8 Width: (cm) 0.5 Depth: (cm) 0.1 Area: (cm) 0.314 Volume: (cm) 0.031 % Reduction in Area: 95.4% % Reduction in Volume: 97.7% Epithelialization: Small (1-33%) Tunneling: No Undermining: No Wound Description Classification: Full Thickness Without Exposed Support Structures Wound Margin: Flat and Intact Exudate Amount: Medium Exudate Type: Sanguinous Exudate Color: red Foul Odor After Cleansing: No Slough/Fibrino No Wound Bed Granulation Amount: Large (67-100%) Exposed Structure Granulation Quality: Red, Pink Fascia Exposed: No Necrotic Amount: Small (1-33%) Fat Layer (Subcutaneous Tissue) Exposed: Yes Necrotic Quality: Adherent Slough Tendon Exposed: No Muscle Exposed: No Joint Exposed: No Bone Exposed: No Periwound Skin Texture Texture Color No Abnormalities Noted: No No Abnormalities Noted: Yes Callus: Yes Temperature / Pain Temperature: No Abnormality Moisture No Abnormalities Noted: No Dry / Scaly: Yes Maceration: No Treatment Notes Wound #1 (Calcaneus) Wound Laterality: Left Cleanser Normal Saline Discharge Instruction:  Cleanse the wound with Normal Saline prior to applying a clean dressing using gauze sponges, not tissue or cotton balls. Soap and Water Discharge Instruction: May shower and wash wound with dial antibacterial soap and water prior to dressing change. Wound Cleanser Discharge Instruction: Cleanse the wound with wound cleanser prior to applying a clean dressing using gauze sponges, not tissue or cotton balls. [facility] Ancillary Kit - [address] DOUGLAS, ROOKS ([id number]) [id number] Page 7 of 7 Discharge Instruction: Use supplies as instructed; Kit contains: (15) Saline Bullets; (15) 3x3 Gauze; 15 pr Gloves Peri-Wound Care Topical Primary Dressing Promogran Prisma Matrix, 4.34 (sq in) (silver collagen) Discharge Instruction: Moisten collagen with saline or hydrogel Secondary Dressing Woven Gauze Sponge, Non-Sterile 4x4 in Discharge Instruction: Apply over primary dressing as directed. Zetuvit Plus 4x8 in Discharge Instruction: Apply over primary dressing as directed. Secured With [employer], 4.5x3.1 (in/yd) Discharge Instruction: Secure with Kerlix as directed. 79M Medipore Soft Cloth Surgical T 2x10 (in/yd) ape Discharge Instruction: Secure with tape as directed. Compression Wrap Compression Stockings Add-Ons Electronic Signature(s) Signed: 10/02/2022 4:49:39 PM By: Blanche East RN Entered By: Blanche East on 10/01/2022 15:17:41 -------------------------------------------------------------------------------- Vitals Details Patient Name: Date of Service: Destiny [name]. 10/01/2022 2:45 PM Medical Record Number: [id number] Patient Account Number: [id number] Date of Birth/Sex: Treating RN: 1946-09-14 (76 y.o. F) Primary Care Andraya Frigon: Deland Pretty Other Clinician: Referring Damia Bobrowski: Treating Jae Skeet/Extender: Raylene Everts in Treatment: 19 Vital Signs Time Taken: 02:40 Temperature (F): 97.8 Height (in):  63 Pulse (bpm): 73 Respiratory Rate (breaths/min): 20 Blood Pressure (mmHg): 140/67 Capillary Blood Glucose (mg/dl): 102 Reference Range: 80 - 120 mg / dl Electronic Signature(s) Signed: 10/01/2022 4:01:13 PM By: Worthy Rancher Entered By: Worthy Rancher on 10/01/2022 14:41:08

## 2022-10-03 DIAGNOSIS — L97412 Non-pressure chronic ulcer of right heel and midfoot with fat layer exposed: Secondary | ICD-10-CM | POA: Diagnosis not present

## 2022-10-07 ENCOUNTER — Ambulatory Visit: Payer: PPO | Admitting: Physical Therapy

## 2022-10-07 DIAGNOSIS — M6281 Muscle weakness (generalized): Secondary | ICD-10-CM | POA: Diagnosis not present

## 2022-10-07 DIAGNOSIS — R279 Unspecified lack of coordination: Secondary | ICD-10-CM

## 2022-10-07 DIAGNOSIS — R293 Abnormal posture: Secondary | ICD-10-CM

## 2022-10-07 NOTE — Therapy (Signed)
OUTPATIENT PHYSICAL THERAPY FEMALE PELVIC TREATMENT   Patient Name: Destiny [name] MRN: [id number] DOB:1946-04-21, 76 y.o., female Today's Date: 10/07/2022  END OF SESSION:  PT End of Session - 10/07/22 1434     Visit Number 3    Date for PT Re-Evaluation 12/11/22    Authorization Type [employer] P[employer]    PT Start Time 1440    PT Stop Time 1519    PT Time Calculation (min) 39 min    Activity Tolerance Patient tolerated treatment well    Behavior During Therapy WFL for tasks assessed/performed             Past Medical History:  Diagnosis Date   Allergy    Ankle dislocation, left, initial encounter 09/23/2017   Arthritis    Blood transfusion    in [place] ([place])    left heel melanoma   Diabetes mellitus    History of kidney stones    Hypothyroidism    Osteoporosis    osteopenia   PONV (postoperative nausea and vomiting)    Sleep apnea    does not use cpap   Thyroid disease    hypothyroidism   Past Surgical History:  Procedure Laterality Date   APPLICATION OF A-CELL OF CHEST/ABDOMEN Left 04/24/2021   Procedure: APPLICATION OF A-CELL LEFT FOOT;  Surgeon: Wallace Going, DO;  Location: [facility];  Service: Plastics;  Laterality: Left;   APPLICATION OF A-CELL OF CHEST/ABDOMEN Left 05/22/2021   Procedure: POSSIBLE APPLICATION OF A-CELL;  Surgeon: Wallace Going, DO;  Location: [place];  Service: Plastics;  Laterality: Left;   COLONOSCOPY     EXCISION MELANOMA WITH SENTINEL LYMPH NODE BIOPSY Left 03/26/2021   Procedure: WIDE LOCAL EXCISION MELANOMA LEFT HEEL, SENTINEL LYMPH NODE MAPPING AND BIOPSY;  Surgeon: [name], MD;  Location: [facility];  Service: General;  Laterality: Left;   EXTERNAL FIXATION LEG Left 09/24/2017   Procedure: EXTERNAL FIXATION ankle;  Surgeon: Shona Needles, MD;  Location: [facility];  Service: Orthopedics;  Laterality: Left;   EXTERNAL FIXATION REMOVAL Left 09/28/2017   Procedure: REMOVAL EXTERNAL FIXATION LEG;  Surgeon:  Shona Needles, MD;  Location: [facility];  Service: Orthopedics;  Laterality: Left;   EYE SURGERY Bilateral    cataract   FLEXIBLE SIGMOIDOSCOPY  08/13/2022   Procedure: FLEXIBLE SIGMOIDOSCOPY;  Surgeon: Leighton Ruff, MD;  Location: [facility];  Service: General;;   LAPAROSCOPIC GASTRIC BANDING  2010   MELANOMA EXCISION Left 04/24/2021   Procedure: REEXCISION MARGIN LEFT HEEL MELANOMA;  Surgeon: [name], MD;  Location: [facility];  Service: General;  Laterality: Left;   MELANOMA EXCISION Left 05/22/2021   Procedure: RE-EXCISION OF LEFT HEEL MELANOMA;  Surgeon: [name], MD;  Location: [place];  Service: General;  Laterality: Left;   OPEN REDUCTION INTERNAL FIXATION (ORIF) DISTAL RADIAL FRACTURE Right 07/29/2016   Procedure: RIGHT OPEN REDUCTION INTERNAL FIXATION (ORIF) DISTAL RADIAL FRACTURE;  Surgeon: Leanora Cover, MD;  Location: [place];  Service: Orthopedics;  Laterality: Right;   ORIF ANKLE FRACTURE Left 09/28/2017   Procedure: OPEN REDUCTION INTERNAL FIXATION (ORIF) ANKLE FRACTURE;  Surgeon: Shona Needles, MD;  Location: [facility];  Service: Orthopedics;  Laterality: Left;   POLYPECTOMY     SKIN DEBRIDEMENT Left 05/22/2021   Procedure: RECONSTRUCTION LEFT HEEL;  Surgeon: Wallace Going, DO;  Location: [facility];  Service: Plastics;  Laterality: Left;   SKIN FULL THICKNESS GRAFT Left 04/24/2021   Procedure: RECONSTRUCTION OF LEFT  HEEL;  Surgeon: Wallace Going, DO;  Location: [place];  Service: Plastics;  Laterality: Left;   SKIN FULL THICKNESS GRAFT Left 05/22/2021   Procedure: POSSIBLE SKIN GRAFT;  Surgeon: Wallace Going, DO;  Location: [facility];  Service: Plastics;  Laterality: Left;   TOTAL ABDOMINAL HYSTERECTOMY W/ BILATERAL SALPINGOOPHORECTOMY  1980   WISDOM TOOTH EXTRACTION     XI ROBOTIC ASSISTED LOWER ANTERIOR RESECTION N/A 08/13/2022   Procedure: XI ROBOTIC ASSISTED LOWER ANTERIOR RESECTION, WITH INTRAOPERATIVE ASSESSMENT OF PERFUSION USING FIREFLY;   Surgeon: Leighton Ruff, MD;  Location: [facility];  Service: General;  Laterality: N/A;   Patient Active Problem List   Diagnosis Date Noted   Rectal cancer ([place]) 04/07/2022   Melanoma of skin ([place]) 04/09/2021   Acquired trigger finger 01/31/2021   Allergic rhinitis 01/31/2021   Body mass index (BMI) 40.0-44.9, adult ([place]) 01/31/2021   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   Dependence on other enabling machines and devices 01/31/2021   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[place]) 01/31/2021   Kidney stone 01/31/2021   Diverticulosis of colon 01/31/2021   Encounter for general adult medical examination with abnormal findings 01/31/2021   High blood pressure 01/31/2021   History of depression 01/31/2021   Hyperlipidemia 01/31/2021   Morbid obesity ([place]) 01/31/2021   Neuropathy 01/31/2021   Non-toxic multinodular goiter 01/31/2021   Onychomycosis 01/31/2021   Penicillin allergy 01/31/2021   Personal history of (healed) traumatic fracture 01/31/2021   Personal history of colonic polyps 01/31/2021   Pure hypercholesterolemia 01/31/2021   Tinnitus 01/31/2021   Vitamin D deficiency 01/31/2021   Lower limb ulcer, heel or midfoot, left, limited to breakdown of skin ([place]) 04/11/2020   Neck mass 02/07/2020   Trimalleolar fracture of ankle, closed, left, initial encounter 09/24/2017   Tibia/fibula fracture 09/23/2017   Closed left ankle fracture 09/23/2017   Ankle dislocation, left, initial encounter 09/23/2017   Thyroid disease    Sleep apnea    Osteoporosis    Type 2 diabetes mellitus ([place])    Oth intartic fracture of lower end of right radius, init 07/29/2016   Anal and rectal polyp 12/16/2013   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 Pt reports she is now able to have a bowel movement with urges during the day with full bowel movements without straining, frequency varies from 1-2 larger bowel movement or 3-4 smaller bowel movements. Night time urination now 2x per night, and during the day 3 hours frequency. Does still wear depends out of the house "just in case" but no longer wearing at home.    Fluid intake: Yes: water - 1 bottle; diet coke per day   10/07/22 - increasing water to 2 bottles per day  PAIN:  Are you having pain? No   PRECAUTIONS: Other: recent rectal CA, no longer having treatments  WEIGHT BEARING RESTRICTIONS: Yes wearing draco shoe with heel out for wound healing, but normal weight bearing  FALLS:  Has patient fallen in last 6 months? No  LIVING ENVIRONMENT: Lives with: lives alone Lives in: House/apartment   OCCUPATION: retired   PLOF: Independent  PATIENT GOALS: to have less fecal leakage  PERTINENT HISTORY:  low anterior resection 08/13/2022, no evidence of metastatic disease, has Lt heel ulcer melanoma biopsy 03/26/2021 (reexcision 04/24/2021 and 05/22/2021, Diabetes, Laparoscopic gastric band surgery,  Sexual abuse: No  BOWEL MOVEMENT: Pain with bowel movement: No Type of bowel movement:Type (Bristol Stool Scale) 5, Frequency multiple bowel movements per day, and Strain No Fully empty rectum: No Leakage: Yes: pt states she has no awareness of leakage and no urge to have a bowel movement. Also has small bowel movements with urinary voids.  And reports she wipes sever times after every  bowel movement and uses wet wipes. Does use destin if needed too.  Pads: Yes: depends - 1-3 per day due to soiling. Sometimes one change per night but other not.  Fiber supplement: No  URINATION: Pain with urination: No Fully empty bladder: Yes:   Stream: Strong and Weak Urgency: No Frequency: right at every 2 hours, usually gets up 3-4x per night to urinate since cancer treatments Leakage:  not usually, but may have small dripping if can't get there quick enough Pads: No  INTERCOURSE: Pain with intercourse:  not active Denied dryness  PREGNANCY: Vaginal deliveries 1 Tearing Yes: "a lot of tearing" C-section deliveries 0 Currently pregnant No  PROLAPSE: None   OBJECTIVE:   DIAGNOSTIC FINDINGS:    COGNITION: Overall cognitive status: Within functional limits for tasks assessed     SENSATION: Light touch: Deficits decreased sensation at rectum, bil neuropathy worse in Lt Proprioception: Appears intact  MUSCLE LENGTH: Bil hamstrings and adductors limited by 25%   POSTURE: rounded shoulders and forward head   LUMBARAROM/PROM:  A/PROM A/PROM  eval  Flexion Limited by 50%  Extension Limited by 25%  Right lateral flexion Limited by 25%  Left lateral flexion Limited by 25%  Right rotation Limited by 50%  Left rotation Limited by 50%   (Blank rows = not tested)  LOWER EXTREMITY ROM:  WFL  LOWER EXTREMITY MMT:  Bil hips grossly 4/5, knees and ankles 5/5  PALPATION:   General  mild TTP in lower abdominal quadrants and fascial restrictions throughout abdominal quadrants.  09/23/22: no TTP today does have tissue restrictions continued in all quadrants however noted tightness around scar sites in upper Lt quad.                 External Perineal Exam pt deferred today  Internal Pelvic Floor pt deferred today  Patient confirms identification and approves PT to assess internal pelvic floor and treatment No  PELVIC MMT:   MMT eval   Vaginal   Internal Anal Sphincter   External Anal Sphincter   Puborectalis   Diastasis Recti   (Blank rows = not tested)        TONE: pt deferred today  PROLAPSE: pt deferred today  TODAY'S TREATMENT:                                                                                                                              DATE:  10/07/22  Pt HEP given and reviewed, pt completed 2x reps of each of them with good technique and denied additional questions on how to complete. X2 reps of modified cat/cow in standing, modified tail wags in standing, seated happy baby for 30sx2, diaphragmatic breathing in sitting Pt educated on tissue changes possible with radiation treatments and to let MD know if she has return of symptoms, pelvic pain, tightness felt. Pt agreed. Pt educated on POC currently, goals and reviewing of this. Pt reports she feels she no longer has needs for PT and has met all STG except internal strength as she declined as she is no longer having symptoms and has met 4/6 LTG (not met internal strength goal and squatting goal).    PATIENT EDUCATION:  Education details: voiding mechanics and abdominal massage, [id number] Person educated: Patient Education method: Explanation, Demonstration, Tactile cues, Verbal cues, and Handouts Education comprehension: verbalized understanding and returned demonstration  HOME EXERCISE PROGRAM: voiding mechanics and abdominal massage, [id number]  ASSESSMENT:  CLINICAL IMPRESSION: Patient session focused on educating and review of HEP. Review of pt's goals and needs for PT. Pt reports she is comfortable with today being her last PT session as she is no longer having symptoms and very pleased with progress. Reports her bowels are much more regular and she feels empty with voids, not straining, no pain, I with abdominal massage and denied questions about HEP. Pt educated on tissue changes with radiation treatments previously and pt reports she  hasn't felt any tension, bowel sizes doesn't seem ribbon or pencil like and she is not straining or having leakage now. Pt understands she will need a new referral for provider for any future PT needs and to report to MD if she has any return of symptoms. Pt agreed. This will serve as pt's DC from PT.   OBJECTIVE IMPAIRMENTS: decreased coordination, decreased endurance, decreased mobility, decreased strength, increased fascial restrictions, impaired flexibility, improper body mechanics, and postural dysfunction.   ACTIVITY LIMITATIONS: continence  PARTICIPATION LIMITATIONS: community activity  PERSONAL FACTORS: Time since onset of injury/illness/exacerbation and 1 comorbidity: medical history  are also affecting patient's functional outcome.   REHAB POTENTIAL: Good  CLINICAL DECISION MAKING: Stable/uncomplicated  EVALUATION COMPLEXITY: Low   GOALS: Goals reviewed with patient? Yes  SHORT TERM GOALS: Target date: 10/09/22  Pt to be  I with HEP.  Baseline: Goal status: MET  2.  Pt will report 3 BMs per per day due to improved muscle tone and coordination with BMs.  Baseline:  Goal status: MET  3.  Pt to report more no more than 2 depend changes per day to demonstrate decreased fecal leakage Baseline:  Goal status: MET  4.  Pt to demonstrate at least 3/5 pelvic floor strength for improved pelvic stability and decreased strain at pelvic floor/ decrease leakage.  Baseline:  Goal status: internal not completed at this time per pt preference as her symptoms    LONG TERM GOALS: Target date: 12/11/22  Pt to be I with advanced HEP.  Baseline:  Goal status: MET  2.  Pt will report her BMs are complete due to improved bowel habits and evacuation techniques.  Baseline:  Goal status: MET  3.  Pt to report more no more than 1 depend changes per day to demonstrate decreased fecal leakage Baseline:  Goal status: MET  4.   Pt to demonstrate at least 4/5 pelvic floor strength and  ability to fully relax for improved pelvic stability and decreased strain at pelvic floor/ decrease leakage.  Baseline:  Goal status: pt deferred as she is no longer having symptoms  5.  Pt to be I with abdominal massage, voiding mechanics, and breathing mechanics to improve bowel habits.  Baseline:  Goal status: MET  6.  Pt to demonstrate improved coordination with breathing mechanics and pelvic floor activation with functional exercises such as 10# squat for improved pelvic stability and decreased leakage.  Baseline:  Goal status: not met  PLAN:  PT FREQUENCY: 1x/week  PT DURATION:  6 sessions  PLANNED INTERVENTIONS: Therapeutic exercises, Therapeutic activity, Neuromuscular re-education, Patient/Family education, Self Care, Joint mobilization, Electrical stimulation, Spinal mobilization, Cryotherapy, scar mobilization, Taping, Biofeedback, and Manual therapy  PLAN FOR NEXT SESSION:   PHYSICAL THERAPY DISCHARGE SUMMARY  Visits from Start of Care: 3  Current functional level related to goals / functional outcomes: met all STG except internal strength as she declined as she is no longer having symptoms and has met 4/6 LTG (not met internal strength goal and squatting goal).    Remaining deficits: None per pt, "I feel like I am back to being me."   Education / Equipment: HEP   Patient agrees to discharge. Patient goals were partially met. Patient is being discharged due to being pleased with the current functional level.   Stacy Gardner, PT, DPT 12/26/233:21 PM

## 2022-10-16 ENCOUNTER — Encounter (HOSPITAL_BASED_OUTPATIENT_CLINIC_OR_DEPARTMENT_OTHER): Payer: PPO | Attending: General Surgery | Admitting: General Surgery

## 2022-10-16 DIAGNOSIS — L97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: Secondary | ICD-10-CM | POA: Diagnosis not present

## 2022-10-16 DIAGNOSIS — S91302A Unspecified open wound, left foot, initial encounter: Secondary | ICD-10-CM | POA: Diagnosis not present

## 2022-10-16 DIAGNOSIS — Z923 Personal history of irradiation: Secondary | ICD-10-CM | POA: Insufficient documentation

## 2022-10-16 DIAGNOSIS — M1990 Unspecified osteoarthritis, unspecified site: Secondary | ICD-10-CM | POA: Insufficient documentation

## 2022-10-16 DIAGNOSIS — Z9221 Personal history of antineoplastic chemotherapy: Secondary | ICD-10-CM | POA: Insufficient documentation

## 2022-10-16 DIAGNOSIS — C20 Malignant neoplasm of rectum: Secondary | ICD-10-CM | POA: Insufficient documentation

## 2022-10-16 DIAGNOSIS — E11621 Type 2 diabetes mellitus with foot ulcer: Secondary | ICD-10-CM | POA: Insufficient documentation

## 2022-10-16 NOTE — Progress Notes (Signed)
JUNKO, OHAGAN ([id number]) [id number] Page 1 of 8 Visit Report for 10/16/2022 Arrival Information Details Patient Name: Date of Service: Destiny [name], Destiny [name] 10/16/2022 2:45 PM Medical Record Number: [id number] Patient Account Number: [id number] Date of Birth/Sex: Treating RN: [birth year] (77 y.o. F) Primary Care Jonnathan Birman: Deland Pretty Other Clinician: Referring Shae Hinnenkamp: Treating Norrin Shreffler/Extender: Raylene Everts in Treatment: 21 Visit Information History Since Last Visit All ordered tests and consults were completed: No Patient Arrived: Ambulatory Added or deleted any medications: No Arrival Time: 14:56 Any new allergies or adverse reactions: No Accompanied By: self Had a fall or experienced change in No Transfer Assistance: None activities of daily living that may affect Patient Identification Verified: Yes risk of falls: Secondary Verification Process Completed: Yes Signs or symptoms of abuse/neglect since last visito No Patient Requires Transmission-Based Precautions: No Hospitalized since last visit: No Patient Has Alerts: No Implantable device outside of the clinic excluding No cellular tissue based products placed in the center since last visit: Pain Present Now: No Electronic Signature(s) Signed: 10/16/2022 3:06:19 PM By: Worthy Rancher Entered By: Worthy Rancher on 10/16/2022 14:56:42 -------------------------------------------------------------------------------- Encounter Discharge Information Details Patient Name: Date of Service: Destiny American. 10/16/2022 2:45 PM Medical Record Number: [id number] Patient Account Number: [id number] Date of Birth/Sex: Treating RN: 06/18/1946 (77 y.o. Destiny [name] Primary Care Delainee Tramel: Deland Pretty Other Clinician: Referring Ajah Vanhoose: Treating Anedra Penafiel/Extender: Raylene Everts in Treatment: 21 Encounter Discharge Information Items Post Procedure  Vitals Discharge Condition: Stable Temperature (F): 98.1 Ambulatory Status: Ambulatory Pulse (bpm): 68 Discharge Destination: Home Respiratory Rate (breaths/min): 20 Transportation: Private Auto Blood Pressure (mmHg): 127/74 Accompanied By: self Schedule Follow-up Appointment: Yes Clinical Summary of Care: Electronic Signature(s) Signed: 10/16/2022 4:07:58 PM By: Blanche East RN Entered By: Blanche East on 10/16/2022 15:17:52 Destiny [name] ([id number]) [id number] Page 2 of 8 -------------------------------------------------------------------------------- Lower Extremity Assessment Details Patient Name: Date of Service: Destiny [name], Destiny [name] 10/16/2022 2:45 PM Medical Record Number: [id number] Patient Account Number: [id number] Date of Birth/Sex: Treating RN: [birth year] (77 y.o. Destiny [name] Primary Care Jatniel Verastegui: Deland Pretty Other Clinician: Referring Michaila Kenney: Treating Major Santerre/Extender: Raylene Everts in Treatment: 21 Edema Assessment Assessed: [name]: No] Patrice Paradise: No] Edema: [Left: N] [Right: o] Calf Left: Right: Point of Measurement: 29 cm From Medial Instep 36.5 cm Ankle Left: Right: Point of Measurement: 10 cm From Medial Instep 22 cm Vascular Assessment Pulses: Dorsalis Pedis Palpable: [Left:Yes] Electronic Signature(s) Signed: 10/16/2022 4:07:58 PM By: Blanche East RN Entered By: Blanche East on 10/16/2022 15:07:33 -------------------------------------------------------------------------------- Multi Wound Chart Details Patient Name: Date of Service: Destiny American. 10/16/2022 2:45 PM Medical Record Number: [id number] Patient Account Number: [id number] Date of Birth/Sex: Treating RN: 1946-01-13 (77 y.o. Destiny [name] Primary Care Haider Hornaday: Deland Pretty Other Clinician: Referring Michell Giuliano: Treating Shirle Provencal/Extender: Raylene Everts in Treatment: 21 Vital Signs Height(in):  63 Capillary Blood Glucose(mg/dl): 90 Weight(lbs): Pulse(bpm): 70 Body Mass Index(BMI): Blood Pressure(mmHg): 127/74 Temperature(F): 98.1 Respiratory Rate(breaths/min): 20 [1:Photos:] [N/A:N/A] Left Calcaneus N/A N/A Wound Location: Hematoma N/A N/A Wounding Event: Trauma, Other N/A N/A Primary Etiology: Type II Diabetes, Osteoarthritis N/A N/A Comorbid History: 05/05/2022 N/A N/A Date Acquired: 21 N/A N/A Weeks of Treatment: Open N/A N/A Wound Status: No N/A N/A Wound Recurrence: 0.6x0.3x0.1 N/A N/A Measurements L x W x D (cm) 0.141 N/A N/A A (cm) : rea 0.014 N/A N/A Volume (cm) : 97.90% N/A N/A % Reduction in A rea: 99.00% N/A N/A % Reduction in Volume: Full  Thickness Without Exposed N/A N/A Classification: Support Structures Medium N/A N/A Exudate A mount: Sanguinous N/A N/A Exudate Type: red N/A N/A Exudate Color: Flat and Intact N/A N/A Wound Margin: Large (67-100%) N/A N/A Granulation A mount: Red, Pink N/A N/A Granulation Quality: Small (1-33%) N/A N/A Necrotic A mount: Fat Layer (Subcutaneous Tissue): Yes N/A N/A Exposed Structures: Fascia: No Tendon: No Muscle: No Joint: No Bone: No Small (1-33%) N/A N/A Epithelialization: Debridement - Selective/Open Wound N/A N/A Debridement: Pre-procedure Verification/Time Out 15:10 N/A N/A Taken: Lidocaine 5% topical ointment N/A N/A Pain Control: Callus, Slough N/A N/A Tissue Debrided: Skin/Epidermis N/A N/A Level: 0.18 N/A N/A Debridement A (sq cm): rea Curette N/A N/A Instrument: Minimum N/A N/A Bleeding: Pressure N/A N/A Hemostasis A chieved: Procedure was tolerated well N/A N/A Debridement Treatment Response: 0.6x0.3x0.1 N/A N/A Post Debridement Measurements L x W x D (cm) 0.014 N/A N/A Post Debridement Volume: (cm) Callus: Yes N/A N/A Periwound Skin Texture: Dry/Scaly: Yes N/A N/A Periwound Skin Moisture: Maceration: No No Abnormalities Noted N/A N/A Periwound Skin  Color: No Abnormality N/A N/A Temperature: Debridement N/A N/A Procedures Performed: Treatment Notes Wound #1 (Calcaneus) Wound Laterality: Left Cleanser Normal Saline Discharge Instruction: Cleanse the wound with Normal Saline prior to applying a clean dressing using gauze sponges, not tissue or cotton balls. Soap and Water Discharge Instruction: May shower and wash wound with dial antibacterial soap and water prior to dressing change. Wound Cleanser Discharge Instruction: Cleanse the wound with wound cleanser prior to applying a clean dressing using gauze sponges, not tissue or cotton balls. Byram Ancillary Kit - 15 Day Supply Discharge Instruction: Use supplies as instructed; Kit contains: (15) Saline Bullets; (15) 3x3 Gauze; 15 pr Gloves Peri-Wound Care Topical Primary Dressing Endoform 2x2 in Discharge Instruction: Moisten with saline Secondary Dressing Woven Gauze Sponge, Non-Sterile 4x4 in Discharge Instruction: Apply over primary dressing as directed. Zetuvit Plus 4x8 in Discharge Instruction: Apply over primary dressing as directed. Secured With KIER[employer], FEIGENBAUM ([id number]) [id number] Page 4 of 8 Kerlix Roll Sterile, 4.5x3.1 (in/yd) Discharge Instruction: Secure with Kerlix as directed. 71M Medipore Soft Cloth Surgical T 2x10 (in/yd) ape Discharge Instruction: Secure with tape as directed. Compression Wrap Compression Stockings Add-Ons Electronic Signature(s) Signed: 10/16/2022 3:40:08 PM By: Fredirick Maudlin MD FACS Signed: 10/16/2022 4:07:58 PM By: Blanche East RN Previous Signature: 10/16/2022 3:36:44 PM Version By: Blanche East RN Entered By: Fredirick Maudlin on 10/16/2022 15:40:08 -------------------------------------------------------------------------------- Multi-Disciplinary Care Plan Details Patient Name: Date of Service: Destiny [name], Destiny [name] 10/16/2022 2:45 PM Medical Record Number: [id number] Patient Account Number: [id number] Date  of Birth/Sex: Treating RN: [birth year] (77 y.o. Destiny [name] Primary Care Jazelle Achey: Deland Pretty Other Clinician: Referring Rosaleigh Brazzel: Treating Rilea Arutyunyan/Extender: Raylene Everts in Treatment: [place] reviewed with physician Active Inactive Necrotic Tissue Nursing Diagnoses: Impaired tissue integrity related to necrotic/devitalized tissue Knowledge deficit related to management of necrotic/devitalized tissue Goals: Necrotic/devitalized tissue will be minimized in the wound bed Date Initiated: 08/08/2022 Target Resolution Date: 11/06/2022 Goal Status: Active Patient/caregiver will verbalize understanding of reason and process for debridement of necrotic tissue Date Initiated: 08/08/2022 Target Resolution Date: 11/06/2022 Goal Status: Active Interventions: Assess patient pain level pre-, during and post procedure and prior to discharge Provide education on necrotic tissue and debridement process Treatment Activities: Apply topical anesthetic as ordered : 08/08/2022 Notes: Wound/Skin Impairment Nursing Diagnoses: Impaired tissue integrity Goals: Patient/caregiver will verbalize understanding of skin care regimen Date Initiated: 09/03/2022 Target Resolution Date: 11/06/2022 Goal Status: Active Interventions: Assess  ulceration(s) every visit Treatment Activities: Destiny [name], Destiny [name] ([id number]) [id number] Page 5 of 8 Skin care regimen initiated : 05/19/2022 Notes: Electronic Signature(s) Signed: 10/16/2022 4:07:58 PM By: Blanche East RN Entered By: Blanche East on 10/16/2022 15:16:15 -------------------------------------------------------------------------------- Pain Assessment Details Patient Name: Date of Service: Destiny American 10/16/2022 2:45 PM Medical Record Number: [id number] Patient Account Number: [id number] Date of Birth/Sex: Treating RN: 10/18/1945 (77 y.o. F) Primary Care Shabree Tebbetts: Deland Pretty Other Clinician: Referring Domonik Levario: Treating Yussuf Sawyers/Extender: Raylene Everts in Treatment: 21 Active Problems Location of Pain Severity and Description of Pain Patient Has Paino No Site Locations Pain Management and Medication Current Pain Management: Electronic Signature(s) Signed: 10/16/2022 3:06:19 PM By: Worthy Rancher Entered By: Worthy Rancher on 10/16/2022 14:57:19 -------------------------------------------------------------------------------- Patient/Caregiver Education Details Patient Name: Date of Service: Destiny American 1/4/2024andnbsp2:45 PM Medical Record Number: [id number] Patient Account Number: [id number] Date of Birth/Gender: Treating RN: 06/18/1946 (77 y.o. Destiny [name] Primary Care Physician: Deland Pretty Other Clinician: Referring Physician: Treating Physician/Extender: Raylene Everts in Treatment: [address] Destiny [name], Destiny [name] ([id number]) [id number] Page 6 of 8 Education Provided To: Patient Education Topics Provided Wound Debridement: Methods: Explain/Verbal Responses: State content correctly Wound/Skin Impairment: Methods: Explain/Verbal Responses: Reinforcements needed, State content correctly Electronic Signature(s) Signed: 10/16/2022 4:07:58 PM By: Blanche East RN Entered By: Blanche East on 10/16/2022 15:16:35 -------------------------------------------------------------------------------- Wound Assessment Details Patient Name: Date of Service: Destiny American 10/16/2022 2:45 PM Medical Record Number: [id number] Patient Account Number: [id number] Date of Birth/Sex: Treating RN: January 10, 1946 (77 y.o. F) Primary Care Arraya Buck: Deland Pretty Other Clinician: Referring Delbert Darley: Treating Andres Escandon/Extender: Raylene Everts in Treatment: 21 Wound Status Wound Number: 1 Primary Etiology: Trauma, Other Wound Location: Left  Calcaneus Wound Status: Open Wounding Event: Hematoma Comorbid History: Type II Diabetes, Osteoarthritis Date Acquired: 05/05/2022 Weeks Of Treatment: 21 Clustered Wound: No Photos Wound Measurements Length: (cm) 0.6 Width: (cm) 0.3 Depth: (cm) 0.1 Area: (cm) 0.141 Volume: (cm) 0.014 % Reduction in Area: 97.9% % Reduction in Volume: 99% Epithelialization: Small (1-33%) Tunneling: No Undermining: No Wound Description Classification: Full Thickness Without Exposed Support Wound Margin: Flat and Intact Exudate Amount: Medium Exudate Type: Sanguinous Exudate Color: red Structures Foul Odor After Cleansing: No Slough/Fibrino No Wound Bed Destiny [name], Destiny [name] ([id number]) [id number] Page 7 of 8 Granulation Amount: Large (67-100%) Exposed Structure Granulation Quality: Red, Pink Fascia Exposed: No Necrotic Amount: Small (1-33%) Fat Layer (Subcutaneous Tissue) Exposed: Yes Necrotic Quality: Adherent Slough Tendon Exposed: No Muscle Exposed: No Joint Exposed: No Bone Exposed: No Periwound Skin Texture Texture Color No Abnormalities Noted: No No Abnormalities Noted: Yes Callus: Yes Temperature / Pain Temperature: No Abnormality Moisture No Abnormalities Noted: No Dry / Scaly: Yes Maceration: No Treatment Notes Wound #1 (Calcaneus) Wound Laterality: Left Cleanser Normal Saline Discharge Instruction: Cleanse the wound with Normal Saline prior to applying a clean dressing using gauze sponges, not tissue or cotton balls. Soap and Water Discharge Instruction: May shower and wash wound with dial antibacterial soap and water prior to dressing change. Wound Cleanser Discharge Instruction: Cleanse the wound with wound cleanser prior to applying a clean dressing using gauze sponges, not tissue or cotton balls. Byram Ancillary Kit - 15 Day Supply Discharge Instruction: Use supplies as instructed; Kit contains: (15) Saline Bullets; (15) 3x3 Gauze; 15 pr  Gloves Peri-Wound Care Topical Primary Dressing Endoform 2x2 in Discharge Instruction: Moisten with saline Secondary Dressing Woven Gauze Sponge, Non-Sterile 4x4 in Discharge Instruction: Apply over primary dressing  as directed. Zetuvit Plus 4x8 in Discharge Instruction: Apply over primary dressing as directed. Secured With [employer], 4.5x3.1 (in/yd) Discharge Instruction: Secure with Kerlix as directed. 10M Medipore Soft Cloth Surgical T 2x10 (in/yd) ape Discharge Instruction: Secure with tape as directed. Compression Wrap Compression Stockings Add-Ons Electronic Signature(s) Signed: 10/16/2022 4:07:58 PM By: Blanche East RN Previous Signature: 10/16/2022 3:06:19 PM Version By: Worthy Rancher Entered By: Blanche East on 10/16/2022 15:07:47 -------------------------------------------------------------------------------- Vitals Details Patient Name: Date of Service: Destiny American. 10/16/2022 2:45 PM Medical Record Number: [id number] Patient Account Number: [id number] Destiny [name], Destiny [name] ([id number]) [id number] Page 8 of 8 Date of Birth/Sex: Treating RN: 1946/09/24 (77 y.o. F) Primary Care Toma Arts: Other Clinician: Deland Pretty Referring Judith Campillo: Treating Nea Gittens/Extender: Raylene Everts in Treatment: 21 Vital Signs Time Taken: 02:56 Temperature (F): 98.1 Height (in): 63 Pulse (bpm): 68 Respiratory Rate (breaths/min): 20 Blood Pressure (mmHg): 127/74 Capillary Blood Glucose (mg/dl): 90 Reference Range: 80 - 120 mg / dl Electronic Signature(s) Signed: 10/16/2022 3:06:19 PM By: Worthy Rancher Entered By: Worthy Rancher on 10/16/2022 14:57:10

## 2022-10-16 NOTE — Progress Notes (Addendum)
Destiny, [name] ([id number]) [id number] Page 1 of 8 Visit Report for 10/16/2022 Chief Complaint Document Details Patient Name: Date of Service: Destiny [name], Destiny [name] 10/16/2022 2:45 PM Medical Record Number: [id number] Patient Account Number: [id number] Date of Birth/Sex: Treating RN: 1946/05/27 (77 y.o. F) Primary Care Provider: Deland Pretty Other Clinician: Referring Provider: Treating Provider/Extender: Raylene Everts in Treatment: 21 Information Obtained from: Patient Chief Complaint Patient presents to the wound care center with open non-healing surgical wound(s) Electronic Signature(s) Signed: 10/16/2022 3:40:16 PM By: Fredirick Maudlin MD FACS Entered By: Fredirick Maudlin on 10/16/2022 15:40:16 -------------------------------------------------------------------------------- Debridement Details Patient Name: Date of Service: Destiny [name]. 10/16/2022 2:45 PM Medical Record Number: [id number] Patient Account Number: [id number] Date of Birth/Sex: Treating RN: [birth year] (77 y.o. Iver Nestle, [place] Primary Care Provider: Deland Pretty Other Clinician: Referring Provider: Treating Provider/Extender: Raylene Everts in Treatment: 21 Debridement Performed for Assessment: Wound #1 Left Calcaneus Performed By: Physician Fredirick Maudlin, MD Debridement Type: Debridement Level of Consciousness (Pre-procedure): Awake and Alert Pre-procedure Verification/Time Out Yes - 15:10 Taken: Start Time: 15:11 Pain Control: Lidocaine 5% topical ointment T Area Debrided (L x W): otal 0.6 (cm) x 0.3 (cm) = 0.18 (cm) Tissue and other material debrided: Non-Viable, Callus, Slough, Skin: Epidermis, Slough Level: Skin/Epidermis Debridement Description: Selective/Open Wound Instrument: Curette Bleeding: Minimum Hemostasis Achieved: Pressure Response to Treatment: Procedure was tolerated well Level of Consciousness (Post- Awake  and Alert procedure): Post Debridement Measurements of Total Wound Length: (cm) 0.6 Width: (cm) 0.3 Depth: (cm) 0.1 Volume: (cm) 0.014 Character of Wound/Ulcer Post Debridement: Requires Further Debridement Post Procedure Diagnosis Same as Pre-procedure Notes Scribed for Dr. Celine Ahr by Blanche East, RN Electronic Signature(s) Signed: 10/16/2022 3:55:51 PM By: Fredirick Maudlin MD FACS Signed: 10/16/2022 4:07:58 PM By: Blanche East RN Entered By: Blanche East on 10/16/2022 15:18:36 [name] ([id number]) [id number] Page 2 of 8 -------------------------------------------------------------------------------- HPI Details Patient Name: Date of Service: [name], [name] 10/16/2022 2:45 PM Medical Record Number: [id number] Patient Account Number: [id number] Date of Birth/Sex: Treating RN: 1946/05/12 (77 y.o. 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 After the final reexcision, the wound had an ACell graft applied. She reports that she had good closure of skin over the site. Earlier this year, at the end of May, however, she was diagnosed with rectal cancer and is currently undergoing neoadjuvant therapy (chemotherapy and radiation) in anticipation of future surgery. She reports that about 2 weeks ago, she noted that her skin coverage over the heel was beginning to break down. Her oncologist felt that perhaps the Xeloda was contributing to wound failure and stopped this medication. She continues to receive neoadjuvant radiation therapy, however. She saw plastic surgery last week, and they referred her to the wound care center for further evaluation and management. She  is a type II diabetic, well controlled, with the most recent A1c available to me being 5.9%. ABIs done 1 year ago were normal. On the patient's left heel, there is an irregular wound with some skin maceration. The fat layer is exposed and there is slough over the entire surface. Outside of the area of maceration, the skin is in good condition. No erythema, induration, or odor. No concern for infection. 05/26/2022: The wound is smaller today and  is extremely clean, without any slough buildup. The periwound skin is in much better condition and is no longer macerated. She is scheduled to complete her radiation treatment on Wednesday. 06/03/2022: The wound continues to contract. There is just a little bit of eschar and callus accumulation. The surface is clean. She has completed all of her neoadjuvant therapy and is awaiting surgery in November. 06/10/2022: The wound is smaller by about a centimeter in all dimensions. She does have a little bit of eschar near the Achilles portion of the wound. The wound surface itself is robust and healthy-appearing. 06/24/2022: The wound continues to contract. There is just a small portion open in the center. She does have some dry skin and periwound callus circumferentially. No concern for infection. 07/08/2022: The wound is down to just a pinhole. There is a little bit of eschar on the surface. No concern for infection. 07/22/2022: Her wound is healed. 08/08/2022: Unfortunately, it seems that when her wound was being dressed at her last visit, the nurse applying the dressing pulled some dead skin off and reopened to the wound. I am not sure why this was not brought to my attention, but the patient has been caring for the wound at home with her supplies from her previous admission. She has been wearing her heel off loader and using Hydrofera Blue. There is some eschar accumulation, but no erythema, induration, or other concern for infection. Her colorectal surgery is  scheduled for next Wednesday. 09/03/2022: Ms. Hince returns after undergoing her colon surgery. She has been dressing the wound at home with [facility], but has not been wearing her heel cup. The wound is more open today with a lot of periwound callus, dry skin, and a light layer of slough. No overt signs of infection. 09/17/2022: The wound is little bit smaller today. There is some periwound tissue maceration. She says she has been wearing her heel cup more regularly. 10/01/2022: The wound looks a little smaller in terms of the open dimensions. There is more fresh epithelium present. The periwound skin is actually a bit dry and cracked with peeling skin. 10/16/2022: The periwound skin is extremely dry, thickened, and cracked. The wound is about the same size. There is slough on the wound surface. Electronic Signature(s) Signed: 10/16/2022 3:42:02 PM By: Fredirick Maudlin MD FACS Entered By: Fredirick Maudlin on 10/16/2022 15:42:01 -------------------------------------------------------------------------------- Physical Exam Details Patient Name: Date of Service: Destiny [name] 10/16/2022 2:45 PM Medical Record Number: [id number] Patient Account Number: [id number] Date of Birth/Sex: Treating RN: 08-30-1946 (77 y.o. F) Primary Care Provider: Deland Pretty Other Clinician: Referring Provider: Treating Provider/Extender: Raylene Everts in Treatment: 21 Constitutional . . . . no acute distress. Respiratory Normal work of breathing on room air. Notes NAKEDRA, MEKEEL ([id number]) [id number] Page 3 of 8 10/16/2022: The periwound skin is extremely dry, thickened, and cracked. The wound is about the same size. There is slough on the wound surface. Electronic Signature(s) Signed: 10/16/2022 3:42:31 PM By: Fredirick Maudlin MD FACS Entered By: Fredirick Maudlin on 10/16/2022  15:42:31 -------------------------------------------------------------------------------- Physician Orders Details Patient Name: Date of Service: Destiny [name] 10/16/2022 2:45 PM Medical Record Number: [id number] Patient Account Number: [id number] Date of Birth/Sex: Treating RN: 1945/12/31 (77 y.o. [name] Primary Care Provider: Deland Pretty Other Clinician: Referring Provider: Treating Provider/Extender: Raylene Everts in Treatment: 21 Verbal / Phone Orders: No Diagnosis Coding ICD-10 Coding Code Description [phone] Non-pressure chronic ulcer of left heel and midfoot  with fat layer exposed T81.31XS Disruption of external operation (surgical) wound, not elsewhere classified, sequela C43.9 Malignant melanoma of skin, unspecified C20 Malignant neoplasm of rectum E11.9 Type 2 diabetes mellitus without complications [id number] Personal history of antineoplastic chemotherapy Z92.3 Personal history of irradiation Follow-up Appointments ppointment in 2 weeks. - Dr. Celine Ahr RM 1 Return A Anesthetic Wound #1 Left Calcaneus (In clinic) Topical Lidocaine 4% applied to wound bed Bathing/ Shower/ Hygiene Other Bathing/Shower/Hygiene Orders/Instructions: - Change wound dressing after bathing Edema Control - Lymphedema / SCD / Other Avoid standing for long periods of time. Moisturize legs daily. Off-Loading Other: - Please wear off-loading shoe on left foot to keep pressure off the heel Wound Treatment Wound #1 - Calcaneus Wound Laterality: Left Cleanser: Normal Saline (Generic) 1 x Per Day/30 Days Discharge Instructions: Cleanse the wound with Normal Saline prior to applying a clean dressing using gauze sponges, not tissue or cotton balls. Cleanser: Soap and Water 1 x Per Day/30 Days Discharge Instructions: May shower and wash wound with dial antibacterial soap and water prior to dressing change. Cleanser: Wound Cleanser 1 x Per Day/30 Days Discharge  Instructions: Cleanse the wound with wound cleanser prior to applying a clean dressing using gauze sponges, not tissue or cotton balls. Cleanser: Byram Ancillary Kit - 15 Day Supply (Generic) 1 x Per Day/30 Days Discharge Instructions: Use supplies as instructed; Kit contains: (15) Saline Bullets; (15) 3x3 Gauze; 15 pr Gloves Prim Dressing: Endoform 2x2 in (DME) (Dispense As Written) 1 x Per Day/30 Days ary Discharge Instructions: Moisten with saline Secondary Dressing: Woven Gauze Sponge, Non-Sterile 4x4 in (Generic) 1 x Per Day/30 Days Discharge Instructions: Apply over primary dressing as directed. Secondary Dressing: Zetuvit Plus 4x8 in (Dispense As Written) 1 x Per Day/30 Days Discharge Instructions: Apply over primary dressing as directed. Secured With: [employer], 4.5x3.1 (in/yd) (Generic) 1 x Per Day/30 Days Discharge Instructions: Secure with Kerlix as directed. ANGI, RAMEL ([id number]) [id number] Page 4 of 8 Secured With: 52M Medipore Public affairs consultant Surgical T 2x10 (in/yd) (Generic) 1 x Per Day/30 Days ape Discharge Instructions: Secure with tape as directed. Electronic Signature(s) Signed: 12/08/2022 4:55:53 PM By: Blanche East RN Signed: 12/08/2022 5:13:43 PM By: Fredirick Maudlin MD FACS Previous Signature: 10/16/2022 3:55:51 PM Version By: Fredirick Maudlin MD FACS Entered By: Blanche East on 10/24/2022 12:10:28 -------------------------------------------------------------------------------- Problem List Details Patient Name: Date of Service: Destiny [name] 10/16/2022 2:45 PM Medical Record Number: [id number] Patient Account Number: [id number] Date of Birth/Sex: Treating RN: 1945-12-19 (77 y.o. F) Primary Care Provider: Deland Pretty Other Clinician: Referring Provider: Treating Provider/Extender: Raylene Everts in Treatment: 21 Active Problems ICD-10 Encounter Code Description Active Date  MDM Diagnosis [phone] Non-pressure chronic ulcer of left heel and midfoot with fat layer exposed 05/19/2022 No Yes T81.31XS Disruption of external operation (surgical) wound, not elsewhere classified, 05/19/2022 No Yes sequela C43.9 Malignant melanoma of skin, unspecified 05/19/2022 No Yes C20 Malignant neoplasm of rectum 05/19/2022 No Yes E11.9 Type 2 diabetes mellitus without complications [id number] No Yes Z92.21 Personal history of antineoplastic chemotherapy 05/19/2022 No Yes Z92.3 Personal history of irradiation 05/19/2022 No Yes Inactive Problems Resolved Problems Electronic Signature(s) Signed: 10/16/2022 3:39:12 PM By: Fredirick Maudlin MD FACS Entered By: Fredirick Maudlin on 10/16/2022 15:39:12 -------------------------------------------------------------------------------- Progress Note Details Patient Name: Date of Service: [name] 10/16/2022 2:45 PM Medical Record Number: [id number] Patient Account Number: [id number] Date of Birth/Sex: Treating RN: 1946/02/25 (77 y.o. 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 After the final reexcision, the wound had an ACell graft applied. She reports that she had good closure of skin over the site. Earlier this year, at the end of May, however, she was diagnosed with rectal cancer and is currently undergoing neoadjuvant therapy (chemotherapy and radiation) in anticipation of future surgery. She reports that about 2 weeks ago, she noted that  her skin coverage over the heel was beginning to break down. Her oncologist felt that perhaps the Xeloda was contributing to wound failure and stopped this medication. She continues to receive neoadjuvant radiation therapy, however. She saw plastic surgery last week, and they referred her to the wound care center for further evaluation and management. She is a type II diabetic, well controlled, with the most recent A1c available to me being 5.9%. ABIs done 1 year ago were normal. On the patient's left heel, there is an irregular wound with some skin maceration. The fat layer is exposed and there is slough over the entire surface. Outside of the area of maceration, the skin is in good condition. No erythema, induration, or odor. No concern for infection. 05/26/2022: The wound is smaller today and is extremely clean, without any slough buildup. The periwound skin is in much better condition and is no longer macerated. She is scheduled to complete her radiation treatment on Wednesday. 06/03/2022: The wound continues to contract. There is just a little bit of eschar and callus accumulation. The surface is clean. She has completed all of her neoadjuvant therapy and is awaiting surgery in November. 06/10/2022: The wound is smaller by about a centimeter in all dimensions. She does have a little bit of eschar near the Achilles portion of the wound. The wound surface itself is robust and healthy-appearing. 06/24/2022: The wound continues to contract. There is just a small portion open in the center. She does have some dry skin and periwound callus circumferentially. No concern for infection. 07/08/2022: The wound is down to just a pinhole. There is a little bit of eschar on the surface. No concern for infection. 07/22/2022: Her wound is healed. 08/08/2022: Unfortunately, it seems that when her wound was being dressed at her last visit, the nurse applying the dressing pulled some dead skin off and reopened to the  wound. I am not sure why this was not brought to my attention, but the patient has been caring for the wound at home with her supplies from her previous admission. She has been wearing her heel off loader and using Hydrofera Blue. There is some eschar accumulation, but no erythema, induration, or other concern for infection. Her colorectal surgery is scheduled for next Wednesday. 09/03/2022: Ms. Seccombe returns after undergoing her colon surgery. She has been dressing the wound at home with [facility], but has not been wearing her heel cup. The wound is more open today with a lot of periwound callus, dry skin, and a light layer of slough. No overt signs of infection. 09/17/2022: The wound is little bit smaller today. There is some periwound tissue maceration. She says she has been wearing her heel cup more regularly. 10/01/2022: The wound looks a little smaller in  terms of the open dimensions. There is more fresh epithelium present. The periwound skin is actually a bit dry and cracked with peeling skin. 10/16/2022: The periwound skin is extremely dry, thickened, and cracked. The wound is about the same size. There is slough on the wound surface. Patient History Information obtained from Patient. Social History Former smoker - 1991, Marital Status - Divorced, Alcohol Use - Never, Drug Use - No History, Caffeine Use - Moderate - Diet coke,coffee. Medical History Endocrine Patient has history of Type II Diabetes - Metformin pillsand Ozempic Musculoskeletal Patient has history of Osteoarthritis Hospitalization/Surgery History - Melanoma excision to left heel;ORIF Left ankle fracture;[facility] Kitchen - rectal surgery. Medical A Surgical History Notes nd Eyes Macular Degeneration Ear/Nose/Mouth/Throat Tinnitus Oncologic 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 has had Chemotherapy (stopped) and Radiation -8 treatments left as of 05/19/22 Rectal cancer Psychiatric Hx Depression EMELINE, WINDELL ([id number])  [id number] Page 6 of 8 Objective Constitutional no acute distress. Vitals Time Taken: 2:56 AM, Height: 63 in, Temperature: 98.1 F, Pulse: 68 bpm, Respiratory Rate: 20 breaths/min, Blood Pressure: 127/74 mmHg, Capillary Blood Glucose: 90 mg/dl. Respiratory Normal work of breathing on room air. General Notes: 10/16/2022: The periwound skin is extremely dry, thickened, and cracked. The wound is about the same size. There is slough on the wound surface. Integumentary (Hair, Skin) Wound #1 status is Open. Original cause of wound was Hematoma. The date acquired was: 05/05/2022. The wound has been in treatment 21 weeks. The wound is located on the Left Calcaneus. The wound measures 0.6cm length x 0.3cm width x 0.1cm depth; 0.141cm^2 area and 0.014cm^3 volume. There is Fat Layer (Subcutaneous Tissue) exposed. There is no tunneling or undermining noted. There is a medium amount of sanguinous drainage noted. The wound margin is flat and intact. There is large (67-100%) red, pink granulation within the wound bed. There is a small (1-33%) amount of necrotic tissue within the wound bed including Adherent Slough. The periwound skin appearance had no abnormalities noted for color. The periwound skin appearance exhibited: Callus, Dry/Scaly. The periwound skin appearance did not exhibit: Maceration. Periwound temperature was noted as No Abnormality. Assessment Active Problems ICD-10 Non-pressure chronic ulcer of left heel and midfoot with fat layer exposed Disruption of external operation (surgical) wound, not elsewhere classified, sequela Malignant melanoma of skin, unspecified Malignant neoplasm of rectum Type 2 diabetes mellitus without complications Personal history of antineoplastic chemotherapy Personal history of irradiation Procedures Wound #1 Pre-procedure diagnosis of Wound #1 is a Trauma, Other located on the Left Calcaneus . There was a Selective/Open Wound  Skin/Epidermis Debridement with a total area of 0.18 sq cm performed by Fredirick Maudlin, MD. With the following instrument(s): Curette to remove Non-Viable tissue/material. Material removed includes Callus, Slough, and Skin: Epidermis after achieving pain control using Lidocaine 5% topical ointment. No specimens were taken. A time out was conducted at 15:10, prior to the start of the procedure. A Minimum amount of bleeding was controlled with Pressure. The procedure was tolerated well. Post Debridement Measurements: 0.6cm length x 0.3cm width x 0.1cm depth; 0.014cm^3 volume. Character of Wound/Ulcer Post Debridement requires further debridement. Post procedure Diagnosis Wound #1: Same as Pre-Procedure General Notes: Scribed for Dr. Celine Ahr by Blanche East, RN. Plan Follow-up Appointments: Return Appointment in 2 weeks. - Dr. Celine Ahr RM 1 Anesthetic: Wound #1 Left Calcaneus: (In clinic) Topical Lidocaine 4% applied to wound bed Bathing/ Shower/ Hygiene: Other Bathing/Shower/Hygiene Orders/Instructions: - Change wound dressing after bathing Edema Control - Lymphedema / SCD / Other: Avoid standing for long  periods of time. Moisturize legs daily. Off-Loading: Other: - Please wear off-loading shoe on left foot to keep pressure off the heel WOUND #1: - Calcaneus Wound Laterality: Left Cleanser: Normal Saline (Generic) 1 x Per Day/30 Days Discharge Instructions: Cleanse the wound with Normal Saline prior to applying a clean dressing using gauze sponges, not tissue or cotton balls. Cleanser: Soap and Water 1 x Per Day/30 Days Discharge Instructions: May shower and wash wound with dial antibacterial soap and water prior to dressing change. Cleanser: Wound Cleanser 1 x Per Day/30 Days Discharge Instructions: Cleanse the wound with wound cleanser prior to applying a clean dressing using gauze sponges, not tissue or cotton balls. Cleanser: Byram Ancillary Kit - 15 Day Supply (Generic) 1 x Per Day/30  Days Discharge Instructions: Use supplies as instructed; Kit contains: (15) Saline Bullets; (15) 3x3 Gauze; 15 pr Gloves Prim Dressing: Endoform 2x2 in (DME) (Dispense As Written) 1 x Per Day/30 Days ary Discharge Instructions: Moisten with saline Secondary Dressing: Woven Gauze Sponge, Non-Sterile 4x4 in (Generic) 1 x Per Day/30 Days KATHALIA, GILKES ([id number]) [id number] Page 7 of 8 Discharge Instructions: Apply over primary dressing as directed. Secondary Dressing: Zetuvit Plus 4x8 in (DME) (Dispense As Written) 1 x Per Day/30 Days Discharge Instructions: Apply over primary dressing as directed. Secured With: [employer], 4.5x3.1 (in/yd) (Generic) 1 x Per Day/30 Days Discharge Instructions: Secure with Kerlix as directed. Secured With: 62M [employer] Public affairs consultant Surgical T 2x10 (in/yd) (Generic) 1 x Per Day/30 Days ape Discharge Instructions: Secure with tape as directed. 10/16/2022: The periwound skin is extremely dry, thickened, and cracked. The wound is about the same size. There is slough on the wound surface. I used a curette to debride all of the callus and dry skin away from the periwound. I then debrided the slough off of the wound surface. I am going to try using endoform to see if we can get some epithelialization. Reading back through the previous operative notes. This was originally, after the multiple reexcisions, a 5 x 5 cm wound that required ACell for closure. If the endoform is ineffective, we will look at other skin substitute options. She will follow-up in 2 weeks. Electronic Signature(s) Signed: 10/16/2022 3:48:06 PM By: Fredirick Maudlin MD FACS Entered By: Fredirick Maudlin on 10/16/2022 15:48:05 -------------------------------------------------------------------------------- HxROS Details Patient Name: Date of Service: [name] 10/16/2022 2:45 PM Medical Record Number: [id number] Patient Account Number: [id number] Date of  Birth/Sex: Treating RN: 1946-04-12 (77 y.o. F) Primary Care Provider: Deland Pretty Other Clinician: Referring Provider: Treating Provider/Extender: Raylene Everts in Treatment: 21 Information Obtained From Patient Eyes Medical History: Past Medical History Notes: Macular Degeneration Ear/Nose/Mouth/Throat Medical History: Past Medical History Notes: Tinnitus Endocrine Medical History: Positive for: Type II Diabetes - Metformin pillsand Ozempic Time with diabetes: 25 years Treated with: Oral agents Blood sugar tested every day: Yes Tested : every day Musculoskeletal Medical History: Positive for: Osteoarthritis Oncologic Medical History: Past Medical History Notes: 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 has had Chemotherapy (stopped) and Radiation -8 treatments left as of 05/19/22 Rectal cancer Psychiatric Medical History: Past Medical History Notes: Hx Depression Immunizations Pneumococcal VaccineKEASHIA, PRINE ([id number]) [id number] Page 8 of 8 Received Pneumococcal Vaccination: Yes Received Pneumococcal Vaccination On or After 60th Birthday: Yes Implantable Devices None Hospitalization / Surgery History Type of Hospitalization/Surgery Melanoma excision to left heel;ORIF Left ankle fracture; rectal surgery Family and Social History Former smoker - 1991; Marital Status - Divorced; Alcohol Use: Never; Drug Use:  No History; Caffeine Use: Moderate - Diet coke,coffee; Financial Concerns: No; Food, Clothing or Shelter Needs: No; Support System Lacking: No; Transportation Concerns: No Electronic Signature(s) Signed: 10/16/2022 3:55:51 PM By: Fredirick Maudlin MD FACS Entered By: Fredirick Maudlin on 10/16/2022 15:42:07 -------------------------------------------------------------------------------- SuperBill Details Patient Name: Date of Service: Destiny [name] 10/16/2022 Medical Record Number: [id number] Patient Account Number:  [id number] Date of Birth/Sex: Treating RN: May 26, 1946 (77 y.o. 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 [id number] Personal history of antineoplastic chemotherapy Z92.3 Personal history of irradiation Facility Procedures : CPT4 Code: [id number] Description: [id number] - DEBRIDE WOUND 1ST 20 SQ CM OR < ICD-10 Diagnosis Description L97.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 Modifier: Quantity: 1 Physician Procedures : CPT4 Code Description Modifier [id number] - WC PHYS LEVEL 3 - EST PT 25 ICD-10 Diagnosis Description L97.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 T81.31XS Disruption of external operation (surgical) wound, not  elsewhere classified, sequela 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

## 2022-10-20 ENCOUNTER — Ambulatory Visit: Payer: PPO | Admitting: Physical Therapy

## 2022-10-28 DIAGNOSIS — R319 Hematuria, unspecified: Secondary | ICD-10-CM | POA: Diagnosis not present

## 2022-10-28 DIAGNOSIS — M810 Age-related osteoporosis without current pathological fracture: Secondary | ICD-10-CM | POA: Diagnosis not present

## 2022-10-28 DIAGNOSIS — E039 Hypothyroidism, unspecified: Secondary | ICD-10-CM | POA: Diagnosis not present

## 2022-10-28 DIAGNOSIS — E785 Hyperlipidemia, unspecified: Secondary | ICD-10-CM | POA: Diagnosis not present

## 2022-10-28 DIAGNOSIS — E11610 Type 2 diabetes mellitus with diabetic neuropathic arthropathy: Secondary | ICD-10-CM | POA: Diagnosis not present

## 2022-10-30 ENCOUNTER — Encounter (HOSPITAL_BASED_OUTPATIENT_CLINIC_OR_DEPARTMENT_OTHER): Payer: PPO | Admitting: General Surgery

## 2022-10-30 DIAGNOSIS — E11621 Type 2 diabetes mellitus with foot ulcer: Secondary | ICD-10-CM | POA: Diagnosis not present

## 2022-10-30 DIAGNOSIS — S91302A Unspecified open wound, left foot, initial encounter: Secondary | ICD-10-CM | POA: Diagnosis not present

## 2022-10-30 NOTE — Progress Notes (Addendum)
ISMAEL, TREPTOW ([id number]) [id number] Page 1 of 7 Visit Report for 10/30/2022 Arrival Information Details Patient Name: Date of Service: Destiny [name], Destiny [name] 10/30/2022 2:00 PM Medical Record Number: [id number] Patient Account Number: [id number] Date of Birth/Sex: Treating RN: 06-04-1946 (77 y.o. F) Primary Care Timmie Dugue: Deland Pretty Other Clinician: Referring Deandria Klute: Treating Yaroslav Gombos/Extender: Raylene Everts in Treatment: 23 Visit Information History Since Last Visit All ordered tests and consults were completed: No Patient Arrived: Ambulatory Added or deleted any medications: No Arrival Time: 14:05 Any new allergies or adverse reactions: No Accompanied By: self Had a fall or experienced change in No Transfer Assistance: None activities of daily living that may affect Patient Identification Verified: Yes risk of falls: Secondary Verification Process Completed: Yes Signs or symptoms of abuse/neglect since last visito No Patient Requires Transmission-Based Precautions: No Hospitalized since last visit: No Patient Has Alerts: No Implantable device outside of the clinic excluding No cellular tissue based products placed in the center since last visit: Pain Present Now: No Electronic Signature(s) Signed: 10/30/2022 2:14:41 PM By: Worthy Rancher Entered By: Worthy Rancher on 10/30/2022 14:05:28 -------------------------------------------------------------------------------- Encounter Discharge Information Details Patient Name: Date of Service: Destiny Dark F. 10/30/2022 2:00 PM Medical Record Number: [id number] Patient Account Number: [id number] Date of Birth/Sex: Treating RN: 1945-12-27 (77 y.o. [name] Primary Care Sherlon Nied: Deland Pretty Other Clinician: Referring Blakely Maranan: Treating Faizon Capozzi/Extender: Raylene Everts in Treatment: 23 Encounter Discharge Information Items Post Procedure  Vitals Discharge Condition: Stable Temperature (F): 98 Ambulatory Status: Ambulatory Pulse (bpm): 81 Discharge Destination: Home Respiratory Rate (breaths/min): 20 Transportation: Private Auto Blood Pressure (mmHg): 184/81 Accompanied By: self Schedule Follow-up Appointment: Yes Clinical Summary of Care: Patient Declined Electronic Signature(s) Signed: 10/30/2022 5:46:07 PM By: Dellie Catholic RN Entered By: Dellie Catholic on 10/30/2022 17:44:38 Overall, Destiny [name] ([id number]) [id number] Page 2 of 7 -------------------------------------------------------------------------------- Lower Extremity Assessment Details Patient Name: Date of Service: Destiny [name], Destiny [name] 10/30/2022 2:00 PM Medical Record Number: [id number] Patient Account Number: [id number] Date of Birth/Sex: Treating RN: [birth year] (77 y.o. [name] Primary Care Lekita Kerekes: Deland Pretty Other Clinician: Referring Morgan Rennert: Treating Carli Lefevers/Extender: Raylene Everts in Treatment: 23 Edema Assessment Assessed: [name]: No] Patrice Paradise: No] Edema: [Left: N] [Right: o] Calf Left: Right: Point of Measurement: 29 cm From Medial Instep 36.5 cm Ankle Left: Right: Point of Measurement: 10 cm From Medial Instep 22 cm Vascular Assessment Pulses: Dorsalis Pedis Palpable: [Left:Yes] Electronic Signature(s) Signed: 10/30/2022 5:46:07 PM By: Dellie Catholic RN Entered By: Dellie Catholic on 10/30/2022 15:13:25 -------------------------------------------------------------------------------- Multi Wound Chart Details Patient Name: Date of Service: Destiny Dark F. 10/30/2022 2:00 PM Medical Record Number: [id number] Patient Account Number: [id number] Date of Birth/Sex: Treating RN: 1946-06-13 (77 y.o. F) Primary Care Latifa Noble: Deland Pretty Other Clinician: Referring [name]: Treating [name]/Extender: Raylene Everts in Treatment: 23 Vital  Signs Height(in): 63 Capillary Blood Glucose(mg/dl): 90 Weight(lbs): 182 Pulse(bpm): 79 Body Mass Index(BMI): 32.2 Blood Pressure(mmHg): 114/74 Temperature(F): 98.1 Respiratory Rate(breaths/min): 20 [1:Photos:] [N/A:N/A] Left Calcaneus N/A N/A Wound Location: Hematoma N/A N/A Wounding Event: Trauma, Other N/A N/A Primary Etiology: Type II Diabetes, Osteoarthritis N/A N/A Comorbid History: 05/05/2022 N/A N/A Date Acquired: 12 N/A N/A Weeks of Treatment: Open N/A N/A Wound Status: No N/A N/A Wound Recurrence: 0.1x0.1x0.1 N/A N/A Measurements L x W x D (cm) 0.008 N/A N/A A (cm) : rea 0.001 N/A N/A Volume (cm) : 99.90% N/A N/A % Reduction in A rea: 99.90% N/A N/A % Reduction in  Volume: Full Thickness Without Exposed N/A N/A Classification: Support Structures Medium N/A N/A Exudate A mount: Sanguinous N/A N/A Exudate Type: red N/A N/A Exudate Color: Flat and Intact N/A N/A Wound Margin: Large (67-100%) N/A N/A Granulation A mount: Red, Pink N/A N/A Granulation Quality: Small (1-33%) N/A N/A Necrotic A mount: Fat Layer (Subcutaneous Tissue): Yes N/A N/A Exposed Structures: Fascia: No Tendon: No Muscle: No Joint: No Bone: No Small (1-33%) N/A N/A Epithelialization: Debridement - Selective/Open Wound N/A N/A Debridement: Pre-procedure Verification/Time Out 15:15 N/A N/A Taken: Lidocaine 5% topical ointment N/A N/A Pain Control: Callus, Slough N/A N/A Tissue Debrided: Non-Viable Tissue N/A N/A Level: 0.01 N/A N/A Debridement A (sq cm): rea Curette N/A N/A Instrument: Minimum N/A N/A Bleeding: Pressure N/A N/A Hemostasis A chieved: 0 N/A N/A Procedural Pain: 0 N/A N/A Post Procedural Pain: Procedure was tolerated well N/A N/A Debridement Treatment Response: 0.1x0.1x0.1 N/A N/A Post Debridement Measurements L x W x D (cm) 0.001 N/A N/A Post Debridement Volume: (cm) Callus: Yes N/A N/A Periwound Skin Texture: Dry/Scaly: Yes N/A  N/A Periwound Skin Moisture: Maceration: No No Abnormalities Noted N/A N/A Periwound Skin Color: No Abnormality N/A N/A Temperature: Debridement N/A N/A Procedures Performed: Treatment Notes Electronic Signature(s) Signed: 10/30/2022 3:49:44 PM By: Fredirick Maudlin MD FACS Entered By: Fredirick Maudlin on 10/30/2022 15:49:43 -------------------------------------------------------------------------------- Multi-Disciplinary Care Plan Details Patient Name: Date of Service: Destiny Dark F. 10/30/2022 2:00 PM Medical Record Number: [id number] Patient Account Number: [id number] Date of Birth/Sex: Treating RN: [birth year] (77 y.o. [name] Primary Care Syair Fricker: Deland Pretty Other Clinician: Referring Tamiko Leopard: Treating Brysun Eschmann/Extender: Raylene Everts in Treatment: [place] reviewed with physician Active Inactive Destiny [name], Destiny [name] ([id number]) [id number] Page 4 of 7 Necrotic Tissue Nursing Diagnoses: Impaired tissue integrity related to necrotic/devitalized tissue Knowledge deficit related to management of necrotic/devitalized tissue Goals: Necrotic/devitalized tissue will be minimized in the wound bed Date Initiated: 08/08/2022 Target Resolution Date: 11/06/2022 Goal Status: Active Patient/caregiver will verbalize understanding of reason and process for debridement of necrotic tissue Date Initiated: 08/08/2022 Target Resolution Date: 11/06/2022 Goal Status: Active Interventions: Assess patient pain level pre-, during and post procedure and prior to discharge Provide education on necrotic tissue and debridement process Treatment Activities: Apply topical anesthetic as ordered : 08/08/2022 Notes: Wound/Skin Impairment Nursing Diagnoses: Impaired tissue integrity Goals: Patient/caregiver will verbalize understanding of skin care regimen Date Initiated: 09/03/2022 Target Resolution Date:  11/06/2022 Goal Status: Active Interventions: Assess ulceration(s) every visit Treatment Activities: Skin care regimen initiated : 05/19/2022 Notes: Electronic Signature(s) Signed: 10/30/2022 5:46:07 PM By: Dellie Catholic RN Entered By: Dellie Catholic on 10/30/2022 17:42:33 -------------------------------------------------------------------------------- Pain Assessment Details Patient Name: Date of Service: Destiny American. 10/30/2022 2:00 PM Medical Record Number: [id number] Patient Account Number: [id number] Date of Birth/Sex: Treating RN: [birth year] (77 y.o. F) Primary Care Lavonna Lampron: Deland Pretty Other Clinician: Referring Madisson Kulaga: Treating Fannye Myer/Extender: Raylene Everts in Treatment: 23 Active Problems Location of Pain Severity and Description of Pain Patient Has Paino No Site Locations Destiny [name], Destiny [name] ([id number]) [id number] Page 5 of 7 Pain Management and Medication Current Pain Management: Electronic Signature(s) Signed: 10/30/2022 2:14:41 PM By: Worthy Rancher Entered By: Worthy Rancher on 10/30/2022 14:06:06 -------------------------------------------------------------------------------- Patient/Caregiver Education Details Patient Name: Date of Service: Destiny American 1/18/2024andnbsp2:00 PM Medical Record Number: [id number] Patient Account Number: [id number] Date of Birth/Gender: Treating RN: [birth year] (77 y.o. [name] Primary Care Physician: Deland Pretty Other Clinician: Referring Physician: Treating Physician/Extender: Raylene Everts  in Treatment: 23 Education Assessment Education Provided To: Patient Education Topics Provided Wound/Skin Impairment: Methods: Explain/Verbal Responses: Return demonstration correctly Electronic Signature(s) Signed: 10/30/2022 5:46:07 PM By: Dellie Catholic RN Entered By: Dellie Catholic on 10/30/2022  17:43:07 -------------------------------------------------------------------------------- Wound Assessment Details Patient Name: Date of Service: Destiny American. 10/30/2022 2:00 PM Medical Record Number: [id number] Patient Account Number: [id number] Date of Birth/Sex: Treating RN: 11-11-1945 (77 y.o. F) Primary Care Milik Gilreath: Deland Pretty Other Clinician: Referring Cybele Maule: Treating Demetria Lightsey/Extender: Aiana, Nordquist, Destiny [name] ([id number]) [id number] Page 6 of 7 Weeks in Treatment: 23 Wound Status Wound Number: 1 Primary Etiology: Diabetic Wound/Ulcer of the Lower Extremity Wound Location: Left Calcaneus Wound Status: Open Wounding Event: Hematoma Comorbid History: Type II Diabetes, Osteoarthritis Date Acquired: 05/05/2022 Weeks Of Treatment: 23 Clustered Wound: No Photos Wound Measurements Length: (cm) 0.1 Width: (cm) 0.1 Depth: (cm) 0.1 Area: (cm) 0.008 Volume: (cm) 0.001 % Reduction in Area: 99.9% % Reduction in Volume: 99.9% Epithelialization: Small (1-33%) Wound Description Classification: Grade 1 Wound Margin: Flat and Intact Exudate Amount: Medium Exudate Type: Sanguinous Exudate Color: red Foul Odor After Cleansing: No Slough/Fibrino No Wound Bed Granulation Amount: Large (67-100%) Exposed Structure Granulation Quality: Red, Pink Fascia Exposed: No Necrotic Amount: Small (1-33%) Fat Layer (Subcutaneous Tissue) Exposed: Yes Necrotic Quality: Adherent Slough Tendon Exposed: No Muscle Exposed: No Joint Exposed: No Bone Exposed: No Periwound Skin Texture Texture Color No Abnormalities Noted: No No Abnormalities Noted: Yes Callus: Yes Temperature / Pain Temperature: No Abnormality Moisture No Abnormalities Noted: No Dry / Scaly: Yes Maceration: No Treatment Notes Wound #1 (Calcaneus) Wound Laterality: Left Cleanser Normal Saline Discharge Instruction: Cleanse the wound with Normal Saline prior  to applying a clean dressing using gauze sponges, not tissue or cotton balls. Soap and Water Discharge Instruction: May shower and wash wound with dial antibacterial soap and water prior to dressing change. Wound Cleanser Discharge Instruction: Cleanse the wound with wound cleanser prior to applying a clean dressing using gauze sponges, not tissue or cotton balls. [employer]ram Ancillary Kit - 15 Day Supply Discharge Instruction: Use supplies as instructed; Kit contains: (15) Saline Bullets; (15) 3x3 Gauze; 15 pr Gloves Peri-Wound Care Destiny [name], Destiny [name] ([id number]) [id number] Page 7 of 7 Topical Primary Dressing Endoform 2x2 in Discharge Instruction: Moisten with saline Secondary Dressing Woven Gauze Sponge, Non-Sterile 4x4 in Discharge Instruction: Apply over primary dressing as directed. Zetuvit Plus 4x8 in Discharge Instruction: Apply over primary dressing as directed. Secured With [employer], 4.5x3.1 (in/yd) Discharge Instruction: Secure with Kerlix as directed. 73M Medipore Soft Cloth Surgical T 2x10 (in/yd) ape Discharge Instruction: Secure with tape as directed. Compression Wrap Compression Stockings Add-Ons Electronic Signature(s) Signed: 10/31/2022 9:23:45 AM By: Dellie Catholic RN Previous Signature: 10/30/2022 2:14:41 PM Version By: Worthy Rancher Entered By: Dellie Catholic on 10/30/2022 17:58:27 -------------------------------------------------------------------------------- Vitals Details Patient Name: Date of Service: Destiny Dark F. 10/30/2022 2:00 PM Medical Record Number: [id number] Patient Account Number: [id number] Date of Birth/Sex: Treating RN: 1946-01-15 (77 y.o. F) Primary Care Dylanie Quesenberry: Deland Pretty Other Clinician: Referring Xela Oregel: Treating Sentoria Brent/Extender: Raylene Everts in Treatment: 23 Vital Signs Time Taken: 02:05 Temperature (F): 98.1 Height (in): 63 Pulse (bpm): 79 Weight (lbs):  182 Respiratory Rate (breaths/min): 20 Body Mass Index (BMI): 32.2 Blood Pressure (mmHg): 114/74 Capillary Blood Glucose (mg/dl): 90 Reference Range: 80 - 120 mg / dl Electronic Signature(s) Signed: 10/30/2022 2:14:41 PM By: Worthy Rancher Entered By: Worthy Rancher on 10/30/2022 14:05:59

## 2022-10-31 NOTE — Progress Notes (Signed)
RIATA, IKEDA ([id number]) [id number] Page 1 of 9 Visit Report for 10/30/2022 Chief Complaint Document Details Patient Name: Date of Service: Destiny Destiny [name], Destiny Destiny [name] 10/30/2022 2:00 PM Medical Record Number: [id number] Patient Account Number: [id number] Date of Birth/Sex: Treating RN: [birth year] (77 y.o. Destiny [name]) Primary Care Provider: Deland Pretty Other Clinician: Referring Provider: Treating Provider/Extender: Raylene Everts in Treatment: 23 Information Obtained from: Patient Chief Complaint Patient presents to the wound care center with open non-healing surgical wound(s) Electronic Signature(s) Signed: 10/30/2022 3:49:53 PM By: Fredirick Maudlin MD FACS Entered By: Fredirick Maudlin on 10/30/2022 15:49:53 -------------------------------------------------------------------------------- Debridement Details Patient Name: Date of Service: Destiny Destiny [name]. 10/30/2022 2:00 PM Medical Record Number: [id number] Patient Account Number: [id number] Date of Birth/Sex: Treating RN: 01-07-1946 (77 y.o. Destiny Destiny [name] Primary Care Provider: Deland Pretty Other Clinician: Referring Provider: Treating Provider/Extender: Raylene Everts in Treatment: 23 Debridement Performed for Assessment: Wound #1 Left Calcaneus Performed By: Physician Fredirick Maudlin, MD Debridement Type: Debridement Level of Consciousness (Pre-procedure): Awake and Alert Pre-procedure Verification/Time Out Yes - 15:15 Taken: Start Time: 15:15 Pain Control: Lidocaine 5% topical ointment T Area Debrided (L x W): otal 0.1 (cm) x 0.1 (cm) = 0.01 (cm) Tissue and other material debrided: Non-Viable, Callus, Slough, Slough Level: Non-Viable Tissue Debridement Description: Selective/Open Wound Instrument: Curette Bleeding: Minimum Hemostasis Achieved: Pressure End Time: 15:16 Procedural Pain: 0 Post Procedural Pain: 0 Response to Treatment: Procedure was  tolerated well Level of Consciousness (Post- Awake and Alert procedure): Post Debridement Measurements of Total Wound Length: (cm) 0.1 Width: (cm) 0.1 Depth: (cm) 0.1 Volume: (cm) 0.001 Character of Wound/Ulcer Post Debridement: Improved Post Procedure Diagnosis Destiny Destiny [name], Destiny Destiny [name] ([id number]) [id number] Page 2 of 9 Same as Pre-procedure Notes Scribed for Dr. Celine Ahr by J.Scotton Electronic Signature(s) Signed: 10/30/2022 5:46:07 PM By: Dellie Catholic RN Signed: 10/31/2022 7:36:34 AM By: Fredirick Maudlin MD FACS Entered By: Dellie Catholic on 10/30/2022 15:18:28 -------------------------------------------------------------------------------- HPI Details Patient Name: Date of Service: Destiny Destiny [name]. 10/30/2022 2:00 PM Medical Record Number: [id number] Patient Account Number: [id number] Date of Birth/Sex: Treating RN: 11/14/1945 (77 y.o. 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 After the final reexcision, the wound had an ACell graft applied. She reports that she had good closure of skin over the site. Earlier this year, at the end of May, however, she was diagnosed with rectal cancer and is currently undergoing neoadjuvant therapy (chemotherapy and radiation) in anticipation of future surgery. She reports that about 2 weeks ago, she noted that her skin coverage over the heel was beginning to break down. Her oncologist felt that perhaps the Xeloda was contributing to wound failure and stopped this medication. She continues to receive neoadjuvant radiation therapy, however. She saw plastic surgery last week, and they referred her to the wound care center for  further evaluation and management. She is a type II diabetic, well controlled, with the most recent A1c available to me being 5.9%. ABIs done 1 year ago were normal. On the patient's left heel, there is an irregular wound with some skin maceration. The fat layer is exposed and there is slough over the entire surface. Outside of the area of maceration, the skin is in good condition. No erythema, induration, or odor. No concern for infection. 05/26/2022: The  wound is smaller today and is extremely clean, without any slough buildup. The periwound skin is in much better condition and is no longer macerated. She is scheduled to complete her radiation treatment on Wednesday. 06/03/2022: The wound continues to contract. There is just a little bit of eschar and callus accumulation. The surface is clean. She has completed all of her neoadjuvant therapy and is awaiting surgery in November. 06/10/2022: The wound is smaller by about a centimeter in all dimensions. She does have a little bit of eschar near the Achilles portion of the wound. The wound surface itself is robust and healthy-appearing. 06/24/2022: The wound continues to contract. There is just a small portion open in the center. She does have some dry skin and periwound callus circumferentially. No concern for infection. 07/08/2022: The wound is down to just a pinhole. There is a little bit of eschar on the surface. No concern for infection. 07/22/2022: Her wound is healed. 08/08/2022: Unfortunately, it seems that when her wound was being dressed at her last visit, the nurse applying the dressing pulled some dead skin off and reopened to the wound. I am not sure why this was not brought to my attention, but the patient has been caring for the wound at home with her supplies from her previous admission. She has been wearing her heel off loader and using Hydrofera Blue. There is some eschar accumulation, but no erythema, induration, or other concern for  infection. Her colorectal surgery is scheduled for next Wednesday. 09/03/2022: Ms. Ashraf returns after undergoing her colon surgery. She has been dressing the wound at home with [facility], but has not been wearing her heel cup. The wound is more open today with a lot of periwound callus, dry skin, and a light layer of slough. No overt signs of infection. 09/17/2022: The wound is little bit smaller today. There is some periwound tissue maceration. She says she has been wearing her heel cup more regularly. 10/01/2022: The wound looks a little smaller in terms of the open dimensions. There is more fresh epithelium present. The periwound skin is actually a bit dry and cracked with peeling skin. 10/16/2022: The periwound skin is extremely dry, thickened, and cracked. The wound is about the same size. There is slough on the wound surface. 10/30/2022: The periwound skin is dry but not nearly as thickened or cracked. The wound is a little bit smaller and more superficial. Minimal slough accumulation. She apparently was unable to get the endoform from the DME supplier so when she ran out of the leftovers from her clinic visit, she went back to using [facility]. Electronic Signature(s) Signed: 10/30/2022 3:52:41 PM By: Fredirick Maudlin MD FACS Entered By: Fredirick Maudlin on 10/30/2022 15:52:41 Destiny Destiny [name], Destiny Destiny [name] ([id number]) [id number] Page 3 of 9 -------------------------------------------------------------------------------- Physical Exam Details Patient Name: Date of Service: Destiny Destiny [name], Destiny Destiny [name] 10/30/2022 2:00 PM Medical Record Number: [id number] Patient Account Number: [id number] Date of Birth/Sex: Treating RN: 05/10/1946 (77 y.o. Destiny [name]) Primary Care Provider: Deland Pretty Other Clinician: Referring Provider: Treating Provider/Extender: Raylene Everts in Treatment: 23 Constitutional . . . . no acute distress. Respiratory Normal work of  breathing on room air. Notes 10/30/2022: The periwound skin is dry but not nearly as thickened or cracked. The wound is a little bit smaller and more superficial. Minimal slough accumulation. Electronic Signature(s) Signed: 10/30/2022 3:53:08 PM By: Fredirick Maudlin MD FACS Entered By: Fredirick Maudlin on 10/30/2022 15:53:07 -------------------------------------------------------------------------------- Physician Orders Details Patient Name: Date of Service: Destiny Destiny [name],  Destiny Destiny [name] 10/30/2022 2:00 PM Medical Record Number: [id number] Patient Account Number: [id number] Date of Birth/Sex: Treating RN: 22-Jul-1946 (77 y.o. Destiny Destiny [name] Primary Care Provider: Deland Pretty Other Clinician: Referring Provider: Treating Provider/Extender: Raylene Everts in Treatment: 23 Verbal / Phone Orders: No Diagnosis Coding ICD-10 Coding Code Description [phone] Non-pressure chronic ulcer of left heel and midfoot with fat layer exposed T81.31XS Disruption of external operation (surgical) wound, not elsewhere classified, sequela C43.9 Malignant melanoma of skin, unspecified C20 Malignant neoplasm of rectum E11.9 Type 2 diabetes mellitus without complications M08.67 Personal history of antineoplastic chemotherapy Z92.3 Personal history of irradiation Follow-up Appointments ppointment in 2 weeks. - Dr. Celine Ahr RM 1 Return A Anesthetic Wound #1 Left Calcaneus (In clinic) Topical Lidocaine 4% applied to wound bed Bathing/ Shower/ Hygiene Other Bathing/Shower/Hygiene Orders/Instructions: - Change wound dressing after bathing Edema Control - Lymphedema / SCD / Other Avoid standing for long periods of time. Destiny Destiny [name], Destiny Destiny [name] ([id number]) [id number] Page 4 of 9 Moisturize legs daily. Off-Loading Other: - Please wear off-loading shoe on left foot to keep pressure off the heel Additional Orders / Instructions Other: - RUN IVR for  Apligraf;Theraskin;Kerecis Wound Treatment Wound #1 - Calcaneus Wound Laterality: Left Cleanser: Normal Saline (Generic) 1 x Per Day/30 Days Discharge Instructions: Cleanse the wound with Normal Saline prior to applying a clean dressing using gauze sponges, not tissue or cotton balls. Cleanser: Soap and Water 1 x Per Day/30 Days Discharge Instructions: May shower and wash wound with dial antibacterial soap and water prior to dressing change. Cleanser: Wound Cleanser 1 x Per Day/30 Days Discharge Instructions: Cleanse the wound with wound cleanser prior to applying a clean dressing using gauze sponges, not tissue or cotton balls. Cleanser: Byram Ancillary Kit - 15 Day Supply (Generic) 1 x Per Day/30 Days Discharge Instructions: Use supplies as instructed; Kit contains: (15) Saline Bullets; (15) 3x3 Gauze; 15 pr Gloves Prim Dressing: Endoform 2x2 in (Dispense As Written) 1 x Per Day/30 Days ary Discharge Instructions: Moisten with saline Secondary Dressing: Woven Gauze Sponge, Non-Sterile 4x4 in (Generic) 1 x Per Day/30 Days Discharge Instructions: Apply over primary dressing as directed. Secondary Dressing: Zetuvit Plus 4x8 in (Dispense As Written) 1 x Per Day/30 Days Discharge Instructions: Apply over primary dressing as directed. Secured With: [employer], 4.5x3.1 (in/yd) (Generic) 1 x Per Day/30 Days Discharge Instructions: Secure with Kerlix as directed. Secured With: 48M Medipore Public affairs consultant Surgical T 2x10 (in/yd) (Generic) 1 x Per Day/30 Days ape Discharge Instructions: Secure with tape as directed. Electronic Signature(s) Signed: 10/31/2022 7:36:34 AM By: Fredirick Maudlin MD FACS Entered By: Fredirick Maudlin on 10/30/2022 15:53:23 -------------------------------------------------------------------------------- Problem List Details Patient Name: Date of Service: Destiny Destiny [name]. 10/30/2022 2:00 PM Medical Record Number: [id number] Patient Account Number: [id number] Date of  Birth/Sex: Treating RN: 1946-01-11 (77 y.o. Destiny [name]) Primary Care Provider: Deland Pretty Other Clinician: Referring Provider: Treating Provider/Extender: Raylene Everts in Treatment: 23 Active Problems ICD-10 Encounter Code Description Active Date MDM Diagnosis L97.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 05/19/2022 No Yes T81.31XS Disruption of external operation (surgical) wound, not elsewhere classified, 05/19/2022 No Yes sequela C43.9 Malignant melanoma of skin, unspecified 05/19/2022 No Yes BRIELLAH, BAIK ([id number]) [id number] Page 5 of 9 C20 Malignant neoplasm of rectum 05/19/2022 No Yes E11.9 Type 2 diabetes mellitus without complications 06/19/6733 No Yes Z92.21 Personal history of antineoplastic chemotherapy 05/19/2022 No Yes Z92.3 Personal history of irradiation 05/19/2022 No Yes Inactive  Problems Resolved Problems Electronic Signature(s) Signed: 10/30/2022 3:49:20 PM By: Fredirick Maudlin MD FACS Entered By: Fredirick Maudlin on 10/30/2022 15:49:19 -------------------------------------------------------------------------------- Progress Note Details Patient Name: Date of Service: Destiny Destiny [name]. 10/30/2022 2:00 PM Medical Record Number: [id number] Patient Account Number: [id number] Date of Birth/Sex: Treating RN: 1946/08/15 (77 y.o. 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 After the final reexcision, the wound had an ACell graft applied. She reports that she had good  closure of skin over the site. Earlier this year, at the end of May, however, she was diagnosed with rectal cancer and is currently undergoing neoadjuvant therapy (chemotherapy and radiation) in anticipation of future surgery. She reports that about 2 weeks ago, she noted that her skin coverage over the heel was beginning to break down. Her oncologist felt that perhaps the Xeloda was contributing to wound failure and stopped this medication. She continues to receive neoadjuvant radiation therapy, however. She saw plastic surgery last week, and they referred her to the wound care center for further evaluation and management. She is a type II diabetic, well controlled, with the most recent A1c available to me being 5.9%. ABIs done 1 year ago were normal. On the patient's left heel, there is an irregular wound with some skin maceration. The fat layer is exposed and there is slough over the entire surface. Outside of the area of maceration, the skin is in good condition. No erythema, induration, or odor. No concern for infection. 05/26/2022: The wound is smaller today and is extremely clean, without any slough buildup. The periwound skin is in much better condition and is no longer macerated. She is scheduled to complete her radiation treatment on Wednesday. 06/03/2022: The wound continues to contract. There is just a little bit of eschar and callus accumulation. The surface is clean. She has completed all of her neoadjuvant therapy and is awaiting surgery in November. 06/10/2022: The wound is smaller by about a centimeter in all dimensions. She does have a little bit of eschar near the Achilles portion of the wound. The wound surface itself is robust and healthy-appearing. 06/24/2022: The wound continues to contract. There is just a small portion open in the center. She does have some dry skin and periwound callus circumferentially. No concern for infection. 07/08/2022: The wound is down to just a pinhole.  There is a little bit of eschar on the surface. No concern for infection. 07/22/2022: Her wound is healed. Destiny Destiny [name], Destiny Destiny [name] ([id number]) [id number] Page 6 of 9 08/08/2022: Unfortunately, it seems that when her wound was being dressed at her last visit, the nurse applying the dressing pulled some dead skin off and reopened to the wound. I am not sure why this was not brought to my attention, but the patient has been caring for the wound at home with her supplies from her previous admission. She has been wearing her heel off loader and using Hydrofera Blue. There is some eschar accumulation, but no erythema, induration, or other concern for infection. Her colorectal surgery is scheduled for next Wednesday. 09/03/2022: Ms. Tutton returns after undergoing her colon surgery. She has been dressing the wound at home with [facility], but has not  been wearing her heel cup. The wound is more open today with a lot of periwound callus, dry skin, and a light layer of slough. No overt signs of infection. 09/17/2022: The wound is little bit smaller today. There is some periwound tissue maceration. She says she has been wearing her heel cup more regularly. 10/01/2022: The wound looks a little smaller in terms of the open dimensions. There is more fresh epithelium present. The periwound skin is actually a bit dry and cracked with peeling skin. 10/16/2022: The periwound skin is extremely dry, thickened, and cracked. The wound is about the same size. There is slough on the wound surface. 10/30/2022: The periwound skin is dry but not nearly as thickened or cracked. The wound is a little bit smaller and more superficial. Minimal slough accumulation. She apparently was unable to get the endoform from the DME supplier so when she ran out of the leftovers from her clinic visit, she went back to using [facility]. Patient History Information obtained from Patient. Social History Former  smoker - 1991, Marital Status - Divorced, Alcohol Use - Never, Drug Use - No History, Caffeine Use - Moderate - Diet coke,coffee. Medical History Endocrine Patient has history of Type II Diabetes - Metformin pillsand Ozempic Musculoskeletal Patient has history of Osteoarthritis Hospitalization/Surgery History - Melanoma excision to left heel;ORIF Left ankle fracture;[facility] - rectal surgery. Medical A Surgical History Notes nd Eyes Macular Degeneration Ear/Nose/Mouth/Throat Tinnitus Oncologic 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 has had Chemotherapy (stopped) and Radiation -8 treatments left as of 05/19/22 Rectal cancer Psychiatric Hx Depression Objective Constitutional no acute distress. Vitals Time Taken: 2:05 AM, Height: 63 in, Weight: 182 lbs, BMI: 32.2, Temperature: 98.1 Destiny [name], Pulse: 79 bpm, Respiratory Rate: 20 breaths/min, Blood Pressure: 114/74 mmHg, Capillary Blood Glucose: 90 mg/dl. Respiratory Normal work of breathing on room air. General Notes: 10/30/2022: The periwound skin is dry but not nearly as thickened or cracked. The wound is a little bit smaller and more superficial. Minimal slough accumulation. Integumentary (Hair, Skin) Wound #1 status is Open. Original cause of wound was Hematoma. The date acquired was: 05/05/2022. The wound has been in treatment 23 weeks. The wound is located on the Left Calcaneus. The wound measures 0.1cm length x 0.1cm width x 0.1cm depth; 0.008cm^2 area and 0.001cm^3 volume. There is Fat Layer (Subcutaneous Tissue) exposed. There is a medium amount of sanguinous drainage noted. The wound margin is flat and intact. There is large (67-100%) red, pink granulation within the wound bed. There is a small (1-33%) amount of necrotic tissue within the wound bed including Adherent Slough. The periwound skin appearance had no abnormalities noted for color. The periwound skin appearance exhibited: Callus, Dry/Scaly. The periwound skin appearance did not exhibit: Maceration.  Periwound temperature was noted as No Abnormality. Assessment Active Problems ICD-10 Non-pressure chronic ulcer of left heel and midfoot with fat layer exposed Disruption of external operation (surgical) wound, not elsewhere classified, sequela Malignant melanoma of skin, unspecified Destiny Destiny [name], Destiny Destiny [name] ([id number]) [id number] Page 7 of 9 Malignant neoplasm of rectum Type 2 diabetes mellitus without complications Personal history of antineoplastic chemotherapy Personal history of irradiation Procedures Wound #1 Pre-procedure diagnosis of Wound #1 is a Trauma, Other located on the Left Calcaneus . There was a Selective/Open Wound Non-Viable Tissue Debridement with a total area of 0.01 sq cm performed by Fredirick Maudlin, MD. With the following instrument(s): Curette to remove Non-Viable tissue/material. Material removed includes Callus and Slough and after achieving pain control using Lidocaine 5% topical ointment. No specimens  were taken. A time out was conducted at 15:15, prior to the start of the procedure. A Minimum amount of bleeding was controlled with Pressure. The procedure was tolerated well with a pain level of 0 throughout and a pain level of 0 following the procedure. Post Debridement Measurements: 0.1cm length x 0.1cm width x 0.1cm depth; 0.001cm^3 volume. Character of Wound/Ulcer Post Debridement is improved. Post procedure Diagnosis Wound #1: Same as Pre-Procedure General Notes: Scribed for Dr. Celine Ahr by J.Scotton. Plan Follow-up Appointments: Return Appointment in 2 weeks. - Dr. Celine Ahr RM 1 Anesthetic: Wound #1 Left Calcaneus: (In clinic) Topical Lidocaine 4% applied to wound bed Bathing/ Shower/ Hygiene: Other Bathing/Shower/Hygiene Orders/Instructions: - Change wound dressing after bathing Edema Control - Lymphedema / SCD / Other: Avoid standing for long periods of time. Moisturize legs daily. Off-Loading: Other: - Please wear off-loading  shoe on left foot to keep pressure off the heel Additional Orders / Instructions: Other: - RUN IVR for Apligraf;Theraskin;Kerecis WOUND #1: - Calcaneus Wound Laterality: Left Cleanser: Normal Saline (Generic) 1 x Per Day/30 Days Discharge Instructions: Cleanse the wound with Normal Saline prior to applying a clean dressing using gauze sponges, not tissue or cotton balls. Cleanser: Soap and Water 1 x Per Day/30 Days Discharge Instructions: May shower and wash wound with dial antibacterial soap and water prior to dressing change. Cleanser: Wound Cleanser 1 x Per Day/30 Days Discharge Instructions: Cleanse the wound with wound cleanser prior to applying a clean dressing using gauze sponges, not tissue or cotton balls. Cleanser: Byram Ancillary Kit - 15 Day Supply (Generic) 1 x Per Day/30 Days Discharge Instructions: Use supplies as instructed; Kit contains: (15) Saline Bullets; (15) 3x3 Gauze; 15 pr Gloves Prim Dressing: Endoform 2x2 in (Dispense As Written) 1 x Per Day/30 Days ary Discharge Instructions: Moisten with saline Secondary Dressing: Woven Gauze Sponge, Non-Sterile 4x4 in (Generic) 1 x Per Day/30 Days Discharge Instructions: Apply over primary dressing as directed. Secondary Dressing: Zetuvit Plus 4x8 in (Dispense As Written) 1 x Per Day/30 Days Discharge Instructions: Apply over primary dressing as directed. Secured With: [employer], 4.5x3.1 (in/yd) (Generic) 1 x Per Day/30 Days Discharge Instructions: Secure with Kerlix as directed. Secured With: 2M Medipore Public affairs consultant Surgical T 2x10 (in/yd) (Generic) 1 x Per Day/30 Days ape Discharge Instructions: Secure with tape as directed. 10/30/2022: The periwound skin is dry but not nearly as thickened or cracked. The wound is a little bit smaller and more superficial. Minimal slough accumulation. I used a curette to debride slough, eschar, and callus from the wound. We will continue endoform and hopefully she will be able to  obtain this; we will communicate with [employer] in an effort to ensure that it is delivered. We are also going to run her insurance for a couple of different skin substitutes: TheraSkin, Apligraf, and Kerecis. Follow-up in 2 weeks. Electronic Signature(s) Signed: 10/30/2022 3:54:36 PM By: Fredirick Maudlin MD FACS Entered By: Fredirick Maudlin on 10/30/2022 15:54:36 Destiny Destiny [name], Destiny Destiny [name] ([id number]) [id number] Page 8 of 9 -------------------------------------------------------------------------------- HxROS Details Patient Name: Date of Service: Destiny Destiny [name], Destiny Destiny [name] 10/30/2022 2:00 PM Medical Record Number: [id number] Patient Account Number: [id number] Date of Birth/Sex: Treating RN: 1946-05-08 (78 y.o. Destiny [name]) Primary Care Provider: Deland Pretty Other Clinician: Referring Provider: Treating Provider/Extender: Raylene Everts in Treatment: 23 Information Obtained From Patient Eyes Medical History: Past Medical History Notes: Macular Degeneration Ear/Nose/Mouth/Throat Medical History: Past Medical History Notes: Tinnitus Endocrine Medical History: Positive for: Type II Diabetes - Metformin pillsand Ozempic Time  with diabetes: 25 years Treated with: Oral agents Blood sugar tested every day: Yes Tested : every day Musculoskeletal Medical History: Positive for: Osteoarthritis Oncologic Medical History: Past Medical History Notes: 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 has had Chemotherapy (stopped) and Radiation -8 treatments left as of 05/19/22 Rectal cancer Psychiatric Medical History: Past Medical History Notes: Hx Depression Immunizations Pneumococcal Vaccine: Received Pneumococcal Vaccination: Yes Received Pneumococcal Vaccination On or After 60th Birthday: Yes Implantable Devices None Hospitalization / Surgery History Type of Hospitalization/Surgery Melanoma excision to left heel;ORIF Left ankle fracture; rectal surgery Family and Social  History Former smoker - 1991; Marital Status - Divorced; Alcohol Use: Never; Drug Use: No History; Caffeine Use: Moderate - Diet coke,coffee; Financial Concerns: No; Food, Clothing or Shelter Needs: No; Support System Lacking: No; Transportation Concerns: No Electronic Signature(s) Signed: 10/31/2022 7:36:34 AM By: Fredirick Maudlin MD FACS Entered By: Fredirick Maudlin on 10/30/2022 15:52:47 Destiny Destiny [name], Destiny Destiny [name] ([id number]) [id number] Page 9 of 9 -------------------------------------------------------------------------------- SuperBill Details Patient Name: Date of Service: BEREA, MAJKOWSKI 10/30/2022 Medical Record Number: [id number] Patient Account Number: [id number] Date of Birth/Sex: Treating RN: 1945/12/29 (77 y.o. Destiny [name]) Primary Care Provider: Deland Pretty Other Clinician: Referring Provider: Treating Provider/Extender: Raylene Everts in Treatment: 23 Diagnosis Coding ICD-10 Codes Code Description [phone] Non-pressure chronic ulcer of left heel and midfoot with fat layer exposed T81.31XS Disruption of external operation (surgical) wound, not elsewhere classified, sequela C43.9 Malignant melanoma of skin, unspecified C20 Malignant neoplasm of rectum E11.9 Type 2 diabetes mellitus without complications O13.08 Personal history of antineoplastic chemotherapy Z92.3 Personal history of irradiation Facility Procedures : CPT4 Code: [id number] Description: 29528 - DEBRIDE WOUND 1ST 20 SQ CM OR < ICD-10 Diagnosis Description L97.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 Modifier: Quantity: 1 Physician Procedures : CPT4 Code Description Modifier [id number] 10272 - WC PHYS LEVEL 3 - EST PT 25 ICD-10 Diagnosis Description L97.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 T81.31XS Disruption of external operation (surgical) wound, not  elsewhere classified, sequela C43.9 Malignant  melanoma of skin, unspecified E11.9 Type 2 diabetes mellitus without complications Quantity: 1 : [phone] 34742 - WC PHYS DEBR WO ANESTH 20 SQ CM ICD-10 Diagnosis Description L97.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 Quantity: 1 Electronic Signature(s) Signed: 10/30/2022 3:54:55 PM By: Fredirick Maudlin MD FACS Entered By: Fredirick Maudlin on 10/30/2022 15:54:55

## 2022-11-03 ENCOUNTER — Encounter: Payer: PPO | Admitting: Physical Therapy

## 2022-11-06 DIAGNOSIS — E039 Hypothyroidism, unspecified: Secondary | ICD-10-CM | POA: Diagnosis not present

## 2022-11-06 DIAGNOSIS — E785 Hyperlipidemia, unspecified: Secondary | ICD-10-CM | POA: Diagnosis not present

## 2022-11-06 DIAGNOSIS — E11610 Type 2 diabetes mellitus with diabetic neuropathic arthropathy: Secondary | ICD-10-CM | POA: Diagnosis not present

## 2022-11-06 DIAGNOSIS — M810 Age-related osteoporosis without current pathological fracture: Secondary | ICD-10-CM | POA: Diagnosis not present

## 2022-11-13 ENCOUNTER — Encounter (HOSPITAL_BASED_OUTPATIENT_CLINIC_OR_DEPARTMENT_OTHER): Payer: PPO | Attending: General Surgery | Admitting: General Surgery

## 2022-11-13 DIAGNOSIS — Z87891 Personal history of nicotine dependence: Secondary | ICD-10-CM | POA: Insufficient documentation

## 2022-11-13 DIAGNOSIS — E1151 Type 2 diabetes mellitus with diabetic peripheral angiopathy without gangrene: Secondary | ICD-10-CM | POA: Insufficient documentation

## 2022-11-13 DIAGNOSIS — M1990 Unspecified osteoarthritis, unspecified site: Secondary | ICD-10-CM | POA: Insufficient documentation

## 2022-11-13 DIAGNOSIS — E11621 Type 2 diabetes mellitus with foot ulcer: Secondary | ICD-10-CM | POA: Insufficient documentation

## 2022-11-13 DIAGNOSIS — Z85048 Personal history of other malignant neoplasm of rectum, rectosigmoid junction, and anus: Secondary | ICD-10-CM | POA: Diagnosis not present

## 2022-11-13 DIAGNOSIS — Z9221 Personal history of antineoplastic chemotherapy: Secondary | ICD-10-CM | POA: Insufficient documentation

## 2022-11-13 DIAGNOSIS — Z923 Personal history of irradiation: Secondary | ICD-10-CM | POA: Diagnosis not present

## 2022-11-13 DIAGNOSIS — L97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: Secondary | ICD-10-CM | POA: Diagnosis not present

## 2022-11-13 NOTE — Progress Notes (Signed)
GUILLERMO, DIFRANCESCO ([id number]) [id number] Page 1 of 7 Visit Report for 11/13/2022 Arrival Information Details Patient Name: Date of Service: Destiny [name], Destiny [name] 11/13/2022 2:45 PM Medical Record Number: [id number] Patient Account Number: [id number] Date of Birth/Sex: Treating RN: 10-Apr-1946 (77 y.o. F) Primary Care Vaishnavi Dalby: Deland Pretty Other Clinician: Referring Fuquan Wilson: Treating Alizey Noren/Extender: Raylene Everts in Treatment: 25 Visit Information History Since Last Visit All ordered tests and consults were completed: No Patient Arrived: Ambulatory Added or deleted any medications: No Arrival Time: 14:53 Any new allergies or adverse reactions: No Accompanied By: self Had a fall or experienced change in No Transfer Assistance: None activities of daily living that may affect Patient Identification Verified: Yes risk of falls: Secondary Verification Process Completed: Yes Signs or symptoms of abuse/neglect since last visito No Patient Requires Transmission-Based Precautions: No Hospitalized since last visit: No Patient Has Alerts: No Implantable device outside of the clinic excluding No cellular tissue based products placed in the center since last visit: Pain Present Now: No Electronic Signature(s) Signed: 11/13/2022 3:28:28 PM By: Worthy Rancher Entered By: Worthy Rancher on 11/13/2022 14:53:30 -------------------------------------------------------------------------------- Encounter Discharge Information Details Patient Name: Date of Service: Destiny [name] 11/13/2022 2:45 PM Medical Record Number: [id number] Patient Account Number: [id number] Date of Birth/Sex: Treating RN: 04/24/1946 (77 y.o. Elam Dutch Primary Care Lue [place]: Deland Pretty Other Clinician: Referring Kathlean Cinco: Treating Malik Paar/Extender: Raylene Everts in Treatment: 25 Encounter Discharge Information Items Post Procedure  Vitals Discharge Condition: Stable Temperature (F): 98.3 Ambulatory Status: Ambulatory Pulse (bpm): 81 Discharge Destination: Home Respiratory Rate (breaths/min): 18 Transportation: Private Auto Blood Pressure (mmHg): 117/75 Accompanied By: self Schedule Follow-up Appointment: Yes Clinical Summary of Care: Patient Declined Electronic Signature(s) Signed: 11/13/2022 4:55:33 PM By: Baruch Gouty RN, BSN Entered By: Baruch Gouty on 11/13/2022 16:08:29 Destiny [name] ([id number]) [id number] Page 2 of 7 -------------------------------------------------------------------------------- Lower Extremity Assessment Details Patient Name: Date of Service: Destiny [name], Destiny [name] 11/13/2022 2:45 PM Medical Record Number: [id number] Patient Account Number: [id number] Date of Birth/Sex: Treating RN: 01/16/1946 (77 y.o. [name] Primary Care Isaish Alemu: Deland Pretty Other Clinician: Referring Shonette Rhames: Treating Naryah Clenney/Extender: Raylene Everts in Treatment: 25 Edema Assessment Assessed: [name]: No] Patrice Paradise: No] Edema: [Left: N] [Right: o] Calf Left: Right: Point of Measurement: 29 cm From Medial Instep 36.5 cm Ankle Left: Right: Point of Measurement: 10 cm From Medial Instep 22 cm Vascular Assessment Pulses: Dorsalis Pedis Palpable: [Left:Yes] Electronic Signature(s) Signed: 11/13/2022 4:55:54 PM By: Dellie Catholic RN Entered By: Dellie Catholic on 11/13/2022 15:33:40 -------------------------------------------------------------------------------- Multi Wound Chart Details Patient Name: Date of Service: Destiny [name] 11/13/2022 2:45 PM Medical Record Number: [id number] Patient Account Number: [id number] Date of Birth/Sex: Treating RN: 1946/09/11 (77 y.o. F) Primary Care Philemon Riedesel: Deland Pretty Other Clinician: Referring Bellamarie Pflug: Treating Kagen Kunath/Extender: Raylene Everts in Treatment: 25 Vital  Signs Height(in): 63 Pulse(bpm): 81 Weight(lbs): 182 Blood Pressure(mmHg): 117/75 Body Mass Index(BMI): 32.2 Temperature(F): 98.3 Respiratory Rate(breaths/min): 20 [1:Photos:] [N/A:N/A] Left Calcaneus N/A N/A Wound Location: Hematoma N/A N/A Wounding Event: Diabetic Wound/Ulcer of the Lower N/A N/A Primary Etiology: Extremity Type II Diabetes, Osteoarthritis N/A N/A Comorbid History: 05/05/2022 N/A N/A Date Acquired: 25 N/A N/A Weeks of Treatment: Open N/A N/A Wound Status: No N/A N/A Wound Recurrence: 2.8x2x0.1 N/A N/A Measurements L x W x D (cm) 4.398 N/A N/A A (cm) : rea 0.44 N/A N/A Volume (cm) : 35.50% N/A N/A % Reduction in A rea: 67.70% N/A N/A % Reduction  in Volume: Grade 1 N/A N/A Classification: Medium N/A N/A Exudate A mount: Sanguinous N/A N/A Exudate Type: red N/A N/A Exudate Color: Flat and Intact N/A N/A Wound Margin: Large (67-100%) N/A N/A Granulation A mount: Red, Pink N/A N/A Granulation Quality: Small (1-33%) N/A N/A Necrotic A mount: Fat Layer (Subcutaneous Tissue): Yes N/A N/A Exposed Structures: Fascia: No Tendon: No Muscle: No Joint: No Bone: No Small (1-33%) N/A N/A Epithelialization: Debridement - Selective/Open Wound N/A N/A Debridement: Pre-procedure Verification/Time Out 15:40 N/A N/A Taken: Lidocaine 4% Topical Solution N/A N/A Pain Control: Skin/Epidermis N/A N/A Level: 5.6 N/A N/A Debridement A (sq cm): rea Curette N/A N/A Instrument: Minimum N/A N/A Bleeding: Pressure N/A N/A Hemostasis A chieved: 0 N/A N/A Procedural Pain: 0 N/A N/A Post Procedural Pain: Procedure was tolerated well N/A N/A Debridement Treatment Response: 2.8x2x0.1 N/A N/A Post Debridement Measurements L x W x D (cm) 0.44 N/A N/A Post Debridement Volume: (cm) Callus: Yes N/A N/A Periwound Skin Texture: Dry/Scaly: Yes N/A N/A Periwound Skin Moisture: Maceration: No No Abnormalities Noted N/A N/A Periwound Skin  Color: No Abnormality N/A N/A Temperature: Cellular or Tissue Based Product N/A N/A Procedures Performed: Debridement Treatment Notes Electronic Signature(s) Signed: 11/13/2022 3:58:36 PM By: Fredirick Maudlin MD FACS Entered By: Fredirick Maudlin on 11/13/2022 15:58:36 -------------------------------------------------------------------------------- Multi-Disciplinary Care Plan Details Patient Name: Date of Service: Destiny [name] 11/13/2022 2:45 PM Medical Record Number: [id number] Patient Account Number: [id number] Date of Birth/Sex: Treating RN: Sep 23, 1946 (77 y.o. Elam Dutch Primary Care Inioluwa Boulay: Deland Pretty Other Clinician: Referring Tessi Eustache: Treating Shavonna Corella/Extender: Raylene Everts in Treatment: [place] reviewed with physician [address] CHARMEL, PRONOVOST ([id number]) [id number] Page 4 of 7 Necrotic Tissue Nursing Diagnoses: Impaired tissue integrity related to necrotic/devitalized tissue Knowledge deficit related to management of necrotic/devitalized tissue Goals: Necrotic/devitalized tissue will be minimized in the wound bed Date Initiated: 08/08/2022 Target Resolution Date: 12/04/2022 Goal Status: Active Patient/caregiver will verbalize understanding of reason and process for debridement of necrotic tissue Date Initiated: 08/08/2022 Target Resolution Date: 12/04/2022 Goal Status: Active Interventions: Assess patient pain level pre-, during and post procedure and prior to discharge Provide education on necrotic tissue and debridement process Treatment Activities: Apply topical anesthetic as ordered : 08/08/2022 Notes: Wound/Skin Impairment Nursing Diagnoses: Impaired tissue integrity Goals: Patient/caregiver will verbalize understanding of skin care regimen Date Initiated: 09/03/2022 Target Resolution Date: 12/04/2022 Goal Status: Active Interventions: Assess ulceration(s)  every visit Treatment Activities: Skin care regimen initiated : 05/19/2022 Notes: Electronic Signature(s) Signed: 11/13/2022 4:55:33 PM By: Baruch Gouty RN, BSN Entered By: Baruch Gouty on 11/13/2022 15:52:02 -------------------------------------------------------------------------------- Pain Assessment Details Patient Name: Date of Service: Destiny [name] 11/13/2022 2:45 PM Medical Record Number: [id number] Patient Account Number: [id number] Date of Birth/Sex: Treating RN: 1946/03/12 (77 y.o. F) Primary Care Tyrae Alcoser: Deland Pretty Other Clinician: Referring Keyuna Cuthrell: Treating Lemon Whitacre/Extender: Raylene Everts in Treatment: 25 Active Problems Location of Pain Severity and Description of Pain Patient Has Paino Yes Site Locations Destiny [name], Destiny [name] ([id number]) [id number] Page 5 of 7 Pain Management and Medication Current Pain Management: Electronic Signature(s) Signed: 11/13/2022 3:28:28 PM By: Worthy Rancher Entered By: Worthy Rancher on 11/13/2022 14:54:24 -------------------------------------------------------------------------------- Patient/Caregiver Education Details Patient Name: Date of Service: Destiny [name] 2/1/2024andnbsp2:45 PM Medical Record Number: [id number] Patient Account Number: [id number] Date of Birth/Gender: Treating RN: 06/06/1946 (77 y.o. Elam Dutch Primary Care Physician: Deland Pretty Other Clinician: Referring Physician: Treating Physician/Extender: Raylene Everts in Treatment: 25 Education  Assessment Education Provided To: Patient Education Topics Provided Offloading: Methods: Explain/Verbal Responses: Reinforcements needed, State content correctly Wound/Skin Impairment: Methods: Explain/Verbal Responses: Reinforcements needed, State content correctly Electronic Signature(s) Signed: 11/13/2022 4:55:33 PM By: Baruch Gouty RN, BSN Entered By: Baruch Gouty on  11/13/2022 15:52:39 -------------------------------------------------------------------------------- Wound Assessment Details Patient Name: Date of Service: Destiny Dark F. 11/13/2022 2:45 PM Martine, Lurene Shadow ([id number]) [id number] Page 6 of 7 Medical Record Number: [id number] Patient Account Number: [id number] Date of Birth/Sex: Treating RN: 03-01-1946 (77 y.o. F) Primary Care Betsey Sossamon: Deland Pretty Other Clinician: Referring Jawana Reagor: Treating Aowyn Rozeboom/Extender: Raylene Everts in Treatment: 25 Wound Status Wound Number: 1 Primary Etiology: Diabetic Wound/Ulcer of the Lower Extremity Wound Location: Left Calcaneus Wound Status: Open Wounding Event: Hematoma Comorbid History: Type II Diabetes, Osteoarthritis Date Acquired: 05/05/2022 Weeks Of Treatment: 25 Clustered Wound: No Photos Wound Measurements Length: (cm) 2.8 Width: (cm) 2 Depth: (cm) 0.1 Area: (cm) 4.398 Volume: (cm) 0.44 % Reduction in Area: 35.5% % Reduction in Volume: 67.7% Epithelialization: Small (1-33%) Tunneling: No Undermining: No Wound Description Classification: Grade 1 Wound Margin: Flat and Intact Exudate Amount: Medium Exudate Type: Sanguinous Exudate Color: red Foul Odor After Cleansing: No Slough/Fibrino No Wound Bed Granulation Amount: Large (67-100%) Exposed Structure Granulation Quality: Red, Pink Fascia Exposed: No Necrotic Amount: Small (1-33%) Fat Layer (Subcutaneous Tissue) Exposed: Yes Necrotic Quality: Adherent Slough Tendon Exposed: No Muscle Exposed: No Joint Exposed: No Bone Exposed: No Periwound Skin Texture Texture Color No Abnormalities Noted: No No Abnormalities Noted: Yes Callus: Yes Temperature / Pain Temperature: No Abnormality Moisture No Abnormalities Noted: No Dry / Scaly: Yes Maceration: No Treatment Notes Wound #1 (Calcaneus) Wound Laterality: Left Cleanser Normal Saline Discharge Instruction:  Cleanse the wound with Normal Saline prior to applying a clean dressing using gauze sponges, not tissue or cotton balls. Soap and Water Discharge Instruction: May shower and wash wound with dial antibacterial soap and water prior to dressing change. Wound Cleanser Discharge Instruction: Cleanse the wound with wound cleanser prior to applying a clean dressing using gauze sponges, not tissue or cotton balls. Destiny [name], Destiny [name] ([id number]) [id number] Page 7 of 7 Peri-Wound Care Topical Primary Dressing TheraSkin Secondary Dressing ADAPTIC TOUCH 3x4.25 in Discharge Instruction: Apply over primary dressing as directed. Woven Gauze Sponge, Non-Sterile 4x4 in Discharge Instruction: Apply over primary dressing as directed. Zetuvit Plus Silicone Border Heel [id number] (in/in) Discharge Instruction: Apply silicone border over primary dressing as directed. Secured With [employer], 4.5x3.1 (in/yd) Discharge Instruction: Secure with Kerlix as directed. 26M Medipore Soft Cloth Surgical T 2x10 (in/yd) ape Discharge Instruction: Secure with tape as directed. Compression Wrap Compression Stockings Add-Ons Electronic Signature(s) Signed: 11/13/2022 4:55:54 PM By: Dellie Catholic RN Previous Signature: 11/13/2022 3:28:28 PM Version By: Worthy Rancher Entered By: Dellie Catholic on 11/13/2022 15:36:20 -------------------------------------------------------------------------------- Vitals Details Patient Name: Date of Service: Destiny [name]. 11/13/2022 2:45 PM Medical Record Number: [id number] Patient Account Number: [id number] Date of Birth/Sex: Treating RN: [birth year] (77 y.o. F) Primary Care Ryken Paschal: Deland Pretty Other Clinician: Referring Teshaun Olarte: Treating Oleg Oleson/Extender: Raylene Everts in Treatment: 25 Vital Signs Time Taken: 02:53 Temperature (F): 98.3 Height (in): 63 Pulse (bpm): 81 Weight (lbs): 182 Respiratory Rate  (breaths/min): 20 Body Mass Index (BMI): 32.2 Blood Pressure (mmHg): 117/75 Reference Range: 80 - 120 mg / dl Electronic Signature(s) Signed: 11/13/2022 3:28:28 PM By: Worthy Rancher Entered By: Worthy Rancher on 11/13/2022 14:54:13

## 2022-11-13 NOTE — Progress Notes (Signed)
[name], [name] ([id number]) [id number] Page 1 of 10 Visit Report for 11/13/2022 Chief Complaint Document Details Patient Name: Date of Service: Destiny [name], Destiny [name] 11/13/2022 2:45 PM Medical Record Number: [id number] Patient Account Number: [id number] Date of Birth/Sex: Treating RN: June 02, 1946 (77 y.o. F) Primary Care Provider: Deland Pretty Other Clinician: Referring Provider: Treating Provider/Extender: Raylene Everts in Treatment: 25 Information Obtained from: Patient Chief Complaint Patient presents to the wound care center with open non-healing surgical wound(s) Electronic Signature(s) Signed: 11/13/2022 3:58:42 PM By: Fredirick Maudlin MD FACS Entered By: Fredirick Maudlin on 11/13/2022 15:58:41 -------------------------------------------------------------------------------- Cellular or Tissue Based Product Details Patient Name: Date of Service: [name], [name] 11/13/2022 2:45 PM Medical Record Number: [id number] Patient Account Number: [id number] Date of Birth/Sex: Treating RN: June 07, 1946 (76 y.o. Elam Dutch Primary Care Provider: Deland Pretty Other Clinician: Referring Provider: Treating Provider/Extender: Raylene Everts in Treatment: 25 Cellular or Tissue Based Product Type Wound #1 Left Calcaneus Applied to: Performed By: Physician Fredirick Maudlin, MD Cellular or Tissue Based Product Type: Theraskin Level of Consciousness (Pre-procedure): Awake and Alert Pre-procedure Verification/Time Out Yes - 15:45 Taken: Location: genitalia / hands / feet / multiple digits Wound Size (sq cm): 5.6 Product Size (sq cm): 6.5 Waste Size (sq cm): 0 Amount of Product Applied (sq cm): 6.5 Instrument Used: Forceps, Scissors Lot #: [id number] Order #: 1 Expiration Date: 07/17/2027 Fenestrated: No Reconstituted: Yes Solution Type: saline Solution Amount: 20 ml Lot #: [id number] Solution Expiration Date:  03/12/2025 Secured: Yes Secured With: Steri-Strips Dressing Applied: Yes Primary Dressing: adaptic, gauze Procedural Pain: 0 Post Procedural Pain: 0 Response to Treatment: Procedure was tolerated well Level of Consciousness (Post- Awake and Alert ELICE, CRIGGER ([id number]) [id number] Page 2 of 10 procedure): Post Procedure Diagnosis Same as Pre-procedure Electronic Signature(s) Signed: 11/13/2022 4:07:20 PM By: Fredirick Maudlin MD FACS Signed: 11/13/2022 4:55:33 PM By: Baruch Gouty RN, BSN Entered By: Baruch Gouty on 11/13/2022 15:51:04 -------------------------------------------------------------------------------- Debridement Details Patient Name: Date of Service: [name] 11/13/2022 2:45 PM Medical Record Number: [id number] Patient Account Number: [id number] Date of Birth/Sex: Treating RN: 18-Feb-1946 (77 y.o. Elam Dutch Primary Care Provider: Deland Pretty Other Clinician: Referring Provider: Treating Provider/Extender: Raylene Everts in Treatment: 25 Debridement Performed for Assessment: Wound #1 Left Calcaneus Performed By: Physician Fredirick Maudlin, MD Debridement Type: Debridement Severity of Tissue Pre Debridement: Fat layer exposed Level of Consciousness (Pre-procedure): Awake and Alert Pre-procedure Verification/Time Out Yes - 15:40 Taken: Start Time: 15:42 Pain Control: Lidocaine 4% T opical Solution T Area Debrided (L x W): otal 2.8 (cm) x 2 (cm) = 5.6 (cm) Tissue and other material debrided: Non-Viable, Skin: Epidermis Level: Skin/Epidermis Debridement Description: Selective/Open Wound Instrument: Curette Bleeding: Minimum Hemostasis Achieved: Pressure Procedural Pain: 0 Post Procedural Pain: 0 Response to Treatment: Procedure was tolerated well Level of Consciousness (Post- Awake and Alert procedure): Post Debridement Measurements of Total Wound Length: (cm) 2.8 Width: (cm)  2 Depth: (cm) 0.1 Volume: (cm) 0.44 Character of Wound/Ulcer Post Debridement: Improved Severity of Tissue Post Debridement: Fat layer exposed Post Procedure Diagnosis Same as Pre-procedure Notes Scribed for Dr. Celine Ahr by Baruch Gouty, RN Electronic Signature(s) Signed: 11/13/2022 4:07:20 PM By: Fredirick Maudlin MD FACS Signed: 11/13/2022 4:55:33 PM By: Baruch Gouty RN, BSN Entered By: Baruch Gouty on 11/13/2022 15:48:20 [name] ([id number]) [id number] Page 3 of 10 -------------------------------------------------------------------------------- HPI Details Patient Name: Date of Service: Destiny [name], Destiny [name] 11/13/2022 2:45 PM Medical Record Number: [id number] Patient  Account Number: [id number] Date of Birth/Sex: Treating RN: [birth year] (77 y.o. 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 After the final reexcision, the wound had an ACell graft applied. She reports that she had good closure of skin over the site. Earlier this year, at the end of May, however, she was diagnosed with rectal cancer and is currently undergoing neoadjuvant therapy (chemotherapy and radiation) in anticipation of future surgery. She reports that about 2 weeks ago, she noted that her skin coverage over the heel was beginning to break down. Her oncologist felt that perhaps the Xeloda was contributing to wound failure and stopped this medication. She continues to receive neoadjuvant radiation therapy, however. She saw plastic surgery last week, and they referred her to the wound care center for further evaluation and management. She is a type II diabetic, well controlled, with the most  recent A1c available to me being 5.9%. ABIs done 1 year ago were normal. On the patient's left heel, there is an irregular wound with some skin maceration. The fat layer is exposed and there is slough over the entire surface. Outside of the area of maceration, the skin is in good condition. No erythema, induration, or odor. No concern for infection. 05/26/2022: The wound is smaller today and is extremely clean, without any slough buildup. The periwound skin is in much better condition and is no longer macerated. She is scheduled to complete her radiation treatment on Wednesday. 06/03/2022: The wound continues to contract. There is just a little bit of eschar and callus accumulation. The surface is clean. She has completed all of her neoadjuvant therapy and is awaiting surgery in November. 06/10/2022: The wound is smaller by about a centimeter in all dimensions. She does have a little bit of eschar near the Achilles portion of the wound. The wound surface itself is robust and healthy-appearing. 06/24/2022: The wound continues to contract. There is just a small portion open in the center. She does have some dry skin and periwound callus circumferentially. No concern for infection. 07/08/2022: The wound is down to just a pinhole. There is a little bit of eschar on the surface. No concern for infection. 07/22/2022: Her wound is healed. 08/08/2022: Unfortunately, it seems that when her wound was being dressed at her last visit, the nurse applying the dressing pulled some dead skin off and reopened to the wound. I am not sure why this was not brought to my attention, but the patient has been caring for the wound at home with her supplies from her previous admission. She has been wearing her heel off loader and using Hydrofera Blue. There is some eschar accumulation, but no erythema, induration, or other concern for infection. Her colorectal surgery is scheduled for next Wednesday. 09/03/2022: Ms. Dimauro  returns after undergoing her colon surgery. She has been dressing the wound at home with [facility], but has not been wearing her heel cup. The wound is more open today with a lot of periwound callus, dry skin, and a light layer of slough. No overt signs of infection. 09/17/2022: The wound is little bit smaller today. There is some periwound tissue maceration. She says she has been wearing her heel cup more regularly. 10/01/2022: The wound looks a little smaller in terms of the open dimensions. There  is more fresh epithelium present. The periwound skin is actually a bit dry and cracked with peeling skin. 10/16/2022: The periwound skin is extremely dry, thickened, and cracked. The wound is about the same size. There is slough on the wound surface. 10/30/2022: The periwound skin is dry but not nearly as thickened or cracked. The wound is a little bit smaller and more superficial. Minimal slough accumulation. She apparently was unable to get the endoform from the DME supplier so when she ran out of the leftovers from her clinic visit, she went back to using [facility]. 11/13/2022: There is a lot of dry skin around the wound. Otherwise the surface is clean. She has been approved for [employer]. Electronic Signature(s) Signed: 11/13/2022 3:59:19 PM By: Fredirick Maudlin MD FACS Entered By: Fredirick Maudlin on 11/13/2022 15:59:19 -------------------------------------------------------------------------------- Physical Exam Details Patient Name: Date of Service: Destiny Dark F. 11/13/2022 2:45 PM [name] ([id number]) [id number] Page 4 of 10 Medical Record Number: [id number] Patient Account Number: [id number] Date of Birth/Sex: Treating RN: 09-17-1946 (77 y.o. F) Primary Care Provider: Deland Pretty Other Clinician: Referring Provider: Treating Provider/Extender: Raylene Everts in Treatment: 25 Constitutional . . . . no acute  distress. Respiratory Normal work of breathing on room air. Notes 11/13/2022: There is a lot of dry skin around the wound. Otherwise the surface is clean. Electronic Signature(s) Signed: 11/13/2022 3:59:47 PM By: Fredirick Maudlin MD FACS Entered By: Fredirick Maudlin on 11/13/2022 15:59:47 -------------------------------------------------------------------------------- Physician Orders Details Patient Name: Date of Service: [name] 11/13/2022 2:45 PM Medical Record Number: [id number] Patient Account Number: [id number] Date of Birth/Sex: Treating RN: 28-Jul-1946 (77 y.o. Martyn Malay, Linda Primary Care Provider: Deland Pretty Other Clinician: Referring Provider: Treating Provider/Extender: Raylene Everts in Treatment: 25 Verbal / Phone Orders: No Diagnosis Coding ICD-10 Coding Code Description [phone] Non-pressure chronic ulcer of left heel and midfoot with fat layer exposed T81.31XS Disruption of external operation (surgical) wound, not elsewhere classified, sequela C43.9 Malignant melanoma of skin, unspecified C20 Malignant neoplasm of rectum E11.9 Type 2 diabetes mellitus without complications U82.80 Personal history of antineoplastic chemotherapy Z92.3 Personal history of irradiation Follow-up Appointments ppointment in 2 weeks. - Dr. Celine Ahr RM 1 Return A Nurse Visit: - 1 week post Theraskin Anesthetic Wound #1 Left Calcaneus (In clinic) Topical Lidocaine 4% applied to wound bed Cellular or Tissue Based Products Wound #1 Left Calcaneus Cellular or Tissue Based Product Type: - Theraskin #1 Bathing/ Shower/ Hygiene May shower with protection but do not get wound dressing(s) wet. Protect dressing(s) with water repellant cover (for example, large plastic bag) or a cast cover and may then take shower. Edema Control - Lymphedema / SCD / Other Avoid standing for long periods of time. Moisturize legs daily. Off-Loading Other: - Please wear  off-loading shoe on left foot to keep pressure off the heel DENVER, HARDER ([id number]) [id number] Page 5 of 10 Wound Treatment Wound #1 - Calcaneus Wound Laterality: Left Cleanser: Normal Saline (Generic) 1 x Per Week/30 Days Discharge Instructions: Cleanse the wound with Normal Saline prior to applying a clean dressing using gauze sponges, not tissue or cotton balls. Cleanser: Soap and Water 1 x Per Week/30 Days Discharge Instructions: May shower and wash wound with dial antibacterial soap and water prior to dressing change. Cleanser: Wound Cleanser 1 x Per Week/30 Days Discharge Instructions: Cleanse the wound with wound cleanser prior to applying a clean dressing using gauze sponges, not tissue or cotton balls. Prim Dressing: TheraSkin ary  1 x Per Week/30 Days Secondary Dressing: ADAPTIC TOUCH 3x4.25 in 1 x Per Week/30 Days Discharge Instructions: Apply over primary dressing as directed. Secondary Dressing: Woven Gauze Sponge, Non-Sterile 4x4 in (Generic) 1 x Per Week/30 Days Discharge Instructions: Apply over primary dressing as directed. Secondary Dressing: Zetuvit Plus Silicone Border Heel [id number] (in/in) 1 x Per Week/30 Days Discharge Instructions: Apply silicone border over primary dressing as directed. Secured With: [employer], 4.5x3.1 (in/yd) (Generic) 1 x Per Week/30 Days Discharge Instructions: Secure with Kerlix as directed. Secured With: 61M Medipore Public affairs consultant Surgical T 2x10 (in/yd) (Generic) 1 x Per Week/30 Days ape Discharge Instructions: Secure with tape as directed. Electronic Signature(s) Signed: 11/13/2022 4:07:20 PM By: Fredirick Maudlin MD FACS Entered By: Fredirick Maudlin on 11/13/2022 16:01:01 -------------------------------------------------------------------------------- Problem List Details Patient Name: Date of Service: [name] 11/13/2022 2:45 PM Medical Record Number: [id number] Patient Account Number:  [id number] Date of Birth/Sex: Treating RN: 01/31/1946 (77 y.o. F) Primary Care Provider: Deland Pretty Other Clinician: Referring Provider: Treating Provider/Extender: Raylene Everts in Treatment: 25 Active Problems ICD-10 Encounter Code Description Active Date MDM Diagnosis [phone] Non-pressure chronic ulcer of left heel and midfoot with fat layer exposed 05/19/2022 No Yes T81.31XS Disruption of external operation (surgical) wound, not elsewhere classified, 05/19/2022 No Yes sequela C43.9 Malignant melanoma of skin, unspecified 05/19/2022 No Yes C20 Malignant neoplasm of rectum 05/19/2022 No Yes E11.9 Type 2 diabetes mellitus without complications 05/13/2750 No Yes Osmer, GENEIVE SANDSTROM ([id number]) [id number] Page 6 of 10 Z92.21 Personal history of antineoplastic chemotherapy 05/19/2022 No Yes Z92.3 Personal history of irradiation 05/19/2022 No Yes Inactive Problems Resolved Problems Electronic Signature(s) Signed: 11/13/2022 3:58:27 PM By: Fredirick Maudlin MD FACS Entered By: Fredirick Maudlin on 11/13/2022 15:58:27 -------------------------------------------------------------------------------- Progress Note Details Patient Name: Date of Service: Destiny Dark F. 11/13/2022 2:45 PM Medical Record Number: [id number] Patient Account Number: [id number] Date of Birth/Sex: Treating RN: Apr 11, 1946 (77 y.o. 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 After the final reexcision, the wound had an ACell graft applied. She reports that  she had good closure of skin over the site. Earlier this year, at the end of May, however, she was diagnosed with rectal cancer and is currently undergoing neoadjuvant therapy (chemotherapy and radiation) in anticipation of future surgery. She reports that about 2 weeks ago, she noted that her skin coverage over the heel was beginning to break down. Her oncologist felt that perhaps the Xeloda was contributing to wound failure and stopped this medication. She continues to receive neoadjuvant radiation therapy, however. She saw plastic surgery last week, and they referred her to the wound care center for further evaluation and management. She is a type II diabetic, well controlled, with the most recent A1c available to me being 5.9%. ABIs done 1 year ago were normal. On the patient's left heel, there is an irregular wound with some skin maceration. The fat layer is exposed and there is slough over the entire surface. Outside of the area of maceration, the skin is in good condition. No erythema, induration, or odor. No concern for infection. 05/26/2022: The wound is smaller today and is extremely clean, without any slough  buildup. The periwound skin is in much better condition and is no longer macerated. She is scheduled to complete her radiation treatment on Wednesday. 06/03/2022: The wound continues to contract. There is just a little bit of eschar and callus accumulation. The surface is clean. She has completed all of her neoadjuvant therapy and is awaiting surgery in November. 06/10/2022: The wound is smaller by about a centimeter in all dimensions. She does have a little bit of eschar near the Achilles portion of the wound. The wound surface itself is robust and healthy-appearing. 06/24/2022: The wound continues to contract. There is just a small portion open in the center. She does have some dry skin and periwound callus circumferentially. No concern for infection. 07/08/2022: The wound is down to just  a pinhole. There is a little bit of eschar on the surface. No concern for infection. 07/22/2022: Her wound is healed. 08/08/2022: Unfortunately, it seems that when her wound was being dressed at her last visit, the nurse applying the dressing pulled some dead skin off and reopened to the wound. I am not sure why this was not brought to my attention, but the patient has been caring for the wound at home with her supplies from her previous admission. She has been wearing her heel off loader and using Hydrofera Blue. There is some eschar accumulation, but no erythema, induration, or other concern for infection. Her colorectal surgery is scheduled for next Wednesday. 09/03/2022: Ms. Grennan returns after undergoing her colon surgery. She has been dressing the wound at home with [facility], but has not been wearing her heel cup. The wound is more open today with a lot of periwound callus, dry skin, and a light layer of slough. No overt signs of infection. 09/17/2022: The wound is little bit smaller today. There is some periwound tissue maceration. She says she has been wearing her heel cup more regularly. MALKIA, NIPPERT ([id number]) [id number] Page 7 of 10 10/01/2022: The wound looks a little smaller in terms of the open dimensions. There is more fresh epithelium present. The periwound skin is actually a bit dry and cracked with peeling skin. 10/16/2022: The periwound skin is extremely dry, thickened, and cracked. The wound is about the same size. There is slough on the wound surface. 10/30/2022: The periwound skin is dry but not nearly as thickened or cracked. The wound is a little bit smaller and more superficial. Minimal slough accumulation. She apparently was unable to get the endoform from the DME supplier so when she ran out of the leftovers from her clinic visit, she went back to using [facility]. 11/13/2022: There is a lot of dry skin around the wound. Otherwise  the surface is clean. She has been approved for [employer]. Patient History Information obtained from Patient. Social History Former smoker - 1991, Marital Status - Divorced, Alcohol Use - Never, Drug Use - No History, Caffeine Use - Moderate - Diet coke,coffee. Medical History Endocrine Patient has history of Type II Diabetes - Metformin pillsand Ozempic Musculoskeletal Patient has history of Osteoarthritis Hospitalization/Surgery History - Melanoma excision to left heel;ORIF Left ankle fracture;Marland Kitchen - rectal surgery. Medical A Surgical History Notes nd Eyes Macular Degeneration Ear/Nose/Mouth/Throat Tinnitus Oncologic 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 has had Chemotherapy (stopped) and Radiation -8 treatments left as of 05/19/22 Rectal cancer Psychiatric Hx Depression Objective Constitutional no acute distress. Vitals Time Taken: 2:53 AM, Height: 63 in, Weight: 182 lbs, BMI: 32.2, Temperature: 98.3 F, Pulse: 81 bpm, Respiratory Rate: 20 breaths/min, Blood Pressure: 117/75  mmHg. Respiratory Normal work of breathing on room air. General Notes: 11/13/2022: There is a lot of dry skin around the wound. Otherwise the surface is clean. Integumentary (Hair, Skin) Wound #1 status is Open. Original cause of wound was Hematoma. The date acquired was: 05/05/2022. The wound has been in treatment 25 weeks. The wound is located on the Left Calcaneus. The wound measures 2.8cm length x 2cm width x 0.1cm depth; 4.398cm^2 area and 0.44cm^3 volume. There is Fat Layer (Subcutaneous Tissue) exposed. There is no tunneling or undermining noted. There is a medium amount of sanguinous drainage noted. The wound margin is flat and intact. There is large (67-100%) red, pink granulation within the wound bed. There is a small (1-33%) amount of necrotic tissue within the wound bed including Adherent Slough. The periwound skin appearance had no abnormalities noted for color. The periwound skin appearance exhibited: Callus,  Dry/Scaly. The periwound skin appearance did not exhibit: Maceration. Periwound temperature was noted as No Abnormality. Assessment Active Problems ICD-10 Non-pressure chronic ulcer of left heel and midfoot with fat layer exposed Disruption of external operation (surgical) wound, not elsewhere classified, sequela Malignant melanoma of skin, unspecified Malignant neoplasm of rectum Type 2 diabetes mellitus without complications Personal history of antineoplastic chemotherapy Personal history of irradiation Destiny [name], Destiny [name] ([id number]) [id number] Page 8 of 10 Procedures Wound #1 Pre-procedure diagnosis of Wound #1 is a Diabetic Wound/Ulcer of the Lower Extremity located on the Left Calcaneus .Severity of Tissue Pre Debridement is: Fat layer exposed. There was a Selective/Open Wound Skin/Epidermis Debridement with a total area of 5.6 sq cm performed by Fredirick Maudlin, MD. With the following instrument(s): Curette to remove Non-Viable tissue/material. Material removed includes Skin: Epidermis after achieving pain control using Lidocaine 4% T opical Solution. No specimens were taken. A time out was conducted at 15:40, prior to the start of the procedure. A Minimum amount of bleeding was controlled with Pressure. The procedure was tolerated well with a pain level of 0 throughout and a pain level of 0 following the procedure. Post Debridement Measurements: 2.8cm length x 2cm width x 0.1cm depth; 0.44cm^3 volume. Character of Wound/Ulcer Post Debridement is improved. Severity of Tissue Post Debridement is: Fat layer exposed. Post procedure Diagnosis Wound #1: Same as Pre-Procedure General Notes: Scribed for Dr. Celine Ahr by Baruch Gouty, RN. Pre-procedure diagnosis of Wound #1 is a Diabetic Wound/Ulcer of the Lower Extremity located on the Left Calcaneus. A skin graft procedure using a bioengineered skin substitute/cellular or tissue based product was performed by  Fredirick Maudlin, MD with the following instrument(s): Forceps and Scissors. Theraskin was applied and secured with Steri-Strips. 6.5 sq cm of product was utilized and 0 sq cm was wasted. Post Application, adaptic, gauze was applied. A Time Out was conducted at 15:45, prior to the start of the procedure. The procedure was tolerated well with a pain level of 0 throughout and a pain level of 0 following the procedure. Post procedure Diagnosis Wound #1: Same as Pre-Procedure . Plan Follow-up Appointments: Return Appointment in 2 weeks. - Dr. Celine Ahr RM 1 Nurse Visit: - 1 week post Theraskin Anesthetic: Wound #1 Left Calcaneus: (In clinic) Topical Lidocaine 4% applied to wound bed Cellular or Tissue Based Products: Wound #1 Left Calcaneus: Cellular or Tissue Based Product Type: - Theraskin #1 Bathing/ Shower/ Hygiene: May shower with protection but do not get wound dressing(s) wet. Protect dressing(s) with water repellant cover (for example, large plastic bag) or a cast cover and may then take shower. Edema Control -  Lymphedema / SCD / Other: Avoid standing for long periods of time. Moisturize legs daily. Off-Loading: Other: - Please wear off-loading shoe on left foot to keep pressure off the heel WOUND #1: - Calcaneus Wound Laterality: Left Cleanser: Normal Saline (Generic) 1 x Per Week/30 Days Discharge Instructions: Cleanse the wound with Normal Saline prior to applying a clean dressing using gauze sponges, not tissue or cotton balls. Cleanser: Soap and Water 1 x Per Week/30 Days Discharge Instructions: May shower and wash wound with dial antibacterial soap and water prior to dressing change. Cleanser: Wound Cleanser 1 x Per Week/30 Days Discharge Instructions: Cleanse the wound with wound cleanser prior to applying a clean dressing using gauze sponges, not tissue or cotton balls. Prim Dressing: TheraSkin 1 x Per Week/30 Days ary Secondary Dressing: ADAPTIC TOUCH 3x4.25 in 1 x Per  Week/30 Days Discharge Instructions: Apply over primary dressing as directed. Secondary Dressing: Woven Gauze Sponge, Non-Sterile 4x4 in (Generic) 1 x Per Week/30 Days Discharge Instructions: Apply over primary dressing as directed. Secondary Dressing: Zetuvit Plus Silicone Border Heel [id number] (in/in) 1 x Per Week/30 Days Discharge Instructions: Apply silicone border over primary dressing as directed. Secured With: [employer], 4.5x3.1 (in/yd) (Generic) 1 x Per Week/30 Days Discharge Instructions: Secure with Kerlix as directed. Secured With: 61M Medipore Public affairs consultant Surgical T 2x10 (in/yd) (Generic) 1 x Per Week/30 Days ape Discharge Instructions: Secure with tape as directed. 11/13/2022: There is a lot of dry skin around the wound. Otherwise the surface is clean. I used a curette to debride the dead skin from around her wound. TheraSkin #1 was then prepared and applied in standard fashion. It was secured with Adaptic and Steri-Strips. As this area seems to have a tendency to dry out, we applied some hydrogel over the TheraSkin to maintain good moisture balance. She will have a nurse visit next week and I will see her in 2 weeks' time. Electronic Signature(s) Signed: 11/13/2022 4:02:01 PM By: Fredirick Maudlin MD FACS Entered By: Fredirick Maudlin on 11/13/2022 16:02:01 Holsomback, Lurene Shadow ([id number]) [id number] Page 9 of 10 -------------------------------------------------------------------------------- HxROS Details Patient Name: Date of Service: Destiny [name], Destiny [name] 11/13/2022 2:45 PM Medical Record Number: [id number] Patient Account Number: [id number] Date of Birth/Sex: Treating RN: 03-Apr-1946 (77 y.o. F) Primary Care Provider: Deland Pretty Other Clinician: Referring Provider: Treating Provider/Extender: Raylene Everts in Treatment: 25 Information Obtained From Patient Eyes Medical History: Past Medical History Notes: Macular  Degeneration Ear/Nose/Mouth/Throat Medical History: Past Medical History Notes: Tinnitus Endocrine Medical History: Positive for: Type II Diabetes - Metformin pillsand Ozempic Time with diabetes: 25 years Treated with: Oral agents Blood sugar tested every day: Yes Tested : every day Musculoskeletal Medical History: Positive for: Osteoarthritis Oncologic Medical History: Past Medical History Notes: 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 has had Chemotherapy (stopped) and Radiation -8 treatments left as of 05/19/22 Rectal cancer Psychiatric Medical History: Past Medical History Notes: Hx Depression Immunizations Pneumococcal Vaccine: Received Pneumococcal Vaccination: Yes Received Pneumococcal Vaccination On or After 60th Birthday: Yes Implantable Devices None Hospitalization / Surgery History Type of Hospitalization/Surgery Melanoma excision to left heel;ORIF Left ankle fracture; rectal surgery Family and Social History Former smoker - 1991; Marital Status - Divorced; Alcohol Use: Never; Drug Use: No History; Caffeine Use: Moderate - Diet coke,coffee; Financial Concerns: No; Food, Clothing or Shelter Needs: No; Support System Lacking: No; Transportation Concerns: No Electronic Signature(s) Signed: 11/13/2022 4:07:20 PM By: Fredirick Maudlin MD FACS Stotz, Lurene Shadow ([id number]) By: Fredirick Maudlin MD FACS [phone].pdf Page  10 of 10 Signed: 11/13/2022 4:07:20 PM Entered By: Fredirick Maudlin on 11/13/2022 15:59:25 -------------------------------------------------------------------------------- SuperBill Details Patient Name: Date of Service: Destiny [name], Destiny [name] 11/13/2022 Medical Record Number: [id number] Patient Account Number: [id number] Date of Birth/Sex: Treating RN: 03/26/1946 (77 y.o. F) Primary Care Provider: Deland Pretty Other Clinician: Referring Provider: Treating Provider/Extender: Raylene Everts in Treatment: 25 Diagnosis  Coding ICD-10 Codes Code Description [phone] Non-pressure chronic ulcer of left heel and midfoot with fat layer exposed T81.31XS Disruption of external operation (surgical) wound, not elsewhere classified, sequela C43.9 Malignant melanoma of skin, unspecified C20 Malignant neoplasm of rectum E11.9 Type 2 diabetes mellitus without complications M25.00 Personal history of antineoplastic chemotherapy Z92.3 Personal history of irradiation Facility Procedures : CPT4 Code: 37048889 Description: 16945 - SKIN SUB GRAFT FACE/NK/HF/G ICD-10 Diagnosis Description L97.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 Modifier: Quantity: 1 : CPT4 Code: 03888280 Description: K3491- [place] per 1sq cm extra small -6 sq cm Modifier: Quantity: 6 Physician Procedures : CPT4 Code Description Modifier [id number] - WC PHYS LEVEL 3 - EST PT 25 ICD-10 Diagnosis Description L97.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 T81.31XS Disruption of external operation (surgical) wound, not  elsewhere classified, sequela C43.9 Malignant melanoma of skin, unspecified E11.9 Type 2 diabetes mellitus without complications Quantity: 1 : [id number] 65537 - WC PHYS SKIN SUB GRAFT FACE/NK/HF/G ICD-10 Diagnosis Description L97.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 Quantity: 1 Electronic Signature(s) Signed: 11/13/2022 4:07:56 PM By: Fredirick Maudlin MD FACS Signed: 11/13/2022 4:55:33 PM By: Baruch Gouty RN, BSN Previous Signature: 11/13/2022 4:02:48 PM Version By: Fredirick Maudlin MD FACS Entered By: Baruch Gouty on 11/13/2022 16:07:25

## 2022-11-17 ENCOUNTER — Encounter: Payer: PPO | Admitting: Physical Therapy

## 2022-11-20 ENCOUNTER — Encounter (HOSPITAL_BASED_OUTPATIENT_CLINIC_OR_DEPARTMENT_OTHER): Payer: PPO | Admitting: General Surgery

## 2022-11-20 DIAGNOSIS — E11621 Type 2 diabetes mellitus with foot ulcer: Secondary | ICD-10-CM | POA: Diagnosis not present

## 2022-11-20 NOTE — Progress Notes (Signed)
DEARDRA, HINKLEY ([id number]) [id number] Page 1 of 5 Visit Report for 11/20/2022 Arrival Information Details Patient Name: Date of Service: Destiny [name], Destiny [name] 11/20/2022 3:30 PM Medical Record Number: [id number] Patient Account Number: [id number] Date of Birth/Sex: Treating RN: Destiny [name] Primary Care Destiny [name]: Destiny [name] Other Clinician: Referring Destiny [name]: Treating Destiny [name]/Extender: Destiny [name] in Treatment: 26 Visit Information History Since Last Visit Added or deleted any medications: No Patient Arrived: Ambulatory Any new allergies or adverse reactions: No Arrival Time: 15:22 Had a fall or experienced change in No Accompanied By: self activities of daily living that may affect Transfer Assistance: None risk of falls: Patient Requires Transmission-Based Precautions: No Signs or symptoms of abuse/neglect since last visito No Patient Has Alerts: No Hospitalized since last visit: No Implantable device outside of the clinic excluding No cellular tissue based products placed in the center since last visit: Has Dressing in Place as Prescribed: Yes Pain Present Now: No Electronic Signature(s) Signed: 11/20/2022 3:36:20 PM By: Destiny East RN Entered By: Destiny [name] on 11/20/2022 15:23:02 -------------------------------------------------------------------------------- Clinic Level of Care Assessment Details Patient Name: Date of Service: Destiny [name], Destiny [name] 11/20/2022 3:30 PM Medical Record Number: [id number] Patient Account Number: [id number] Date of Birth/Sex: Treating RN: Destiny [name] Primary Care Destiny [name]: Destiny [name] Other Clinician: Referring Destiny [name]: Treating Destiny [name]/Extender: Destiny [name] in Treatment: 26 Clinic Level of Care Assessment Items TOOL 4 Quantity Score X- 1 0 Use when only an EandM is performed on FOLLOW-UP visit ASSESSMENTS -  Nursing Assessment / Reassessment X- 1 10 Reassessment of Co-morbidities (includes updates in patient status) X- 1 5 Reassessment of Adherence to Treatment Plan ASSESSMENTS - Wound and Skin A ssessment / Reassessment X - Simple Wound Assessment / Reassessment - one wound 1 5 '[]'$  - 0 Complex Wound Assessment / Reassessment - multiple wounds '[]'$  - 0 Dermatologic / Skin Assessment (not related to wound area) ASSESSMENTS - Focused Assessment '[]'$  - 0 Circumferential Edema Measurements - multi extremities '[]'$  - 0 Nutritional Assessment / Counseling / Intervention Destiny [name] ([id number]) [id number] Page 2 of 5 '[]'$  - 0 Lower Extremity Assessment (monofilament, tuning fork, pulses) '[]'$  - 0 Peripheral Arterial Disease Assessment (using hand held doppler) ASSESSMENTS - Ostomy and/or Continence Assessment and Care '[]'$  - 0 Incontinence Assessment and Management '[]'$  - 0 Ostomy Care Assessment and Management (repouching, etc.) PROCESS - Coordination of Care X - Simple Patient / Family Education for ongoing care 1 15 '[]'$  - 0 Complex (extensive) Patient / Family Education for ongoing care X- 1 10 Staff obtains Programmer, systems, Records, T Results / Process Orders est X- 1 10 Staff telephones HHA, Nursing Homes / Clarify orders / etc X- 1 10 Routine Transfer to another Facility (non-emergent condition) '[]'$  - 0 Routine Hospital Admission (non-emergent condition) '[]'$  - 0 New Admissions / Biomedical engineer / Ordering NPWT Apligraf, etc. , '[]'$  - 0 Emergency Hospital Admission (emergent condition) '[]'$  - 0 Simple Discharge Coordination '[]'$  - 0 Complex (extensive) Discharge Coordination PROCESS - Special Needs '[]'$  - 0 Pediatric / Minor Patient Management '[]'$  - 0 Isolation Patient Management '[]'$  - 0 Hearing / Language / Visual special needs '[]'$  - 0 Assessment of Community assistance (transportation, D/C planning, etc.) '[]'$  - 0 Additional assistance / Altered mentation '[]'$   - 0 Support Surface(s) Assessment (bed, cushion, seat, etc.) INTERVENTIONS - Wound Cleansing / Measurement X - Simple Wound Cleansing - one wound 1 5 '[]'$  - 0 Complex Wound  Cleansing - multiple wounds '[]'$  - 0 Wound Imaging (photographs - any number of wounds) '[]'$  - 0 Wound Tracing (instead of photographs) X- 1 5 Simple Wound Measurement - one wound '[]'$  - 0 Complex Wound Measurement - multiple wounds INTERVENTIONS - Wound Dressings X - Small Wound Dressing one or multiple wounds 1 10 '[]'$  - 0 Medium Wound Dressing one or multiple wounds '[]'$  - 0 Large Wound Dressing one or multiple wounds '[]'$  - 0 Application of Medications - topical '[]'$  - 0 Application of Medications - injection INTERVENTIONS - Miscellaneous '[]'$  - 0 External ear exam '[]'$  - 0 Specimen Collection (cultures, biopsies, blood, body fluids, etc.) '[]'$  - 0 Specimen(s) / Culture(s) sent or taken to Lab for analysis '[]'$  - 0 Patient Transfer (multiple staff / Civil Service fast streamer / Similar devices) '[]'$  - 0 Simple Staple / Suture removal (25 or less) '[]'$  - 0 Complex Staple / Suture removal (26 or more) '[]'$  - 0 Hypo / Hyperglycemic Management (close monitor of Blood Glucose) Nordhoff, Destiny [name] ([id number]) [id number] Page 3 of 5 '[]'$  - 0 Ankle / Brachial Index (ABI) - do not check if billed separately X- 1 5 Vital Signs Has the patient been seen at the hospital within the last three years: Yes Total Score: 90 Level Of Care: New/Established - Level 3 Electronic Signature(s) Signed: 11/20/2022 3:36:20 PM By: Destiny East RN Entered By: Destiny [name] on 11/20/2022 15:33:39 -------------------------------------------------------------------------------- Encounter Discharge Information Details Patient Name: Date of Service: Destiny [name]. 11/20/2022 3:30 PM Medical Record Number: [id number] Patient Account Number: [id number] Date of Birth/Sex: Treating RN: [birth year] (77 y.o. Destiny [name] Primary Care Destiny [name]:  Destiny [name] Other Clinician: Referring Destiny [name]: Treating Destiny [name]/Extender: Destiny [name] in Treatment: 26 Encounter Discharge Information Items Discharge Condition: Stable Ambulatory Status: Ambulatory Discharge Destination: Home Transportation: Private Auto Accompanied By: self Schedule Follow-up Appointment: Yes Clinical Summary of Care: Electronic Signature(s) Signed: 11/20/2022 3:34:03 PM By: Destiny East RN Entered By: Destiny [name] on 11/20/2022 15:34:03 -------------------------------------------------------------------------------- Patient/Caregiver Education Details Patient Name: Date of Service: Destiny [name] 2/8/2024andnbsp3:30 PM Medical Record Number: [id number] Patient Account Number: [id number] Date of Birth/Gender: Treating RN: 02/19/1946 (77 y.o. Destiny [name] Primary Care Physician: Destiny [name] Other Clinician: Referring Physician: Treating Physician/Extender: Destiny [name] in Treatment: 26 Education Assessment Education Provided To: Patient Education Topics Provided Wound/Skin Impairment: Methods: Explain/Verbal Responses: Reinforcements needed, State content correctly [employer]) Signed: 11/20/2022 3:36:20 PM By: Destiny East RN Entered By: Destiny [name] on 11/20/2022 15:33:59 Destiny [name] ([id number]) [id number] Page 4 of 5 -------------------------------------------------------------------------------- Wound Assessment Details Patient Name: Date of Service: Destiny [name], Destiny [name] 11/20/2022 3:30 PM Medical Record Number: [id number] Patient Account Number: [id number] Date of Birth/Sex: Treating RN: Destiny 02, 1947 (77 y.o. Destiny [name], Jamie Primary Care Vanissa Strength: Destiny [name] Other Clinician: Referring Hollyann Pablo: Treating Camy Leder/Extender: Destiny [name] in Treatment: 26 Wound Status Wound Number: 1 Primary Etiology: Diabetic  Wound/Ulcer of the Lower Extremity Wound Location: Left Calcaneus Wound Status: Open Wounding Event: Hematoma Comorbid History: Type II Diabetes, Osteoarthritis Date Acquired: 05/05/2022 Weeks Of Treatment: 26 Clustered Wound: No Wound Measurements Length: (cm) 2.8 Width: (cm) 2 Depth: (cm) 0.1 Area: (cm) 4.398 Volume: (cm) 0.44 % Reduction in Area: 35.5% % Reduction in Volume: 67.7% Epithelialization: Small (1-33%) Tunneling: No Undermining: No Wound Description Classification: Grade 1 Wound Margin: Flat and Intact Exudate Amount: Medium Exudate Type: Sanguinous Exudate Color: red Foul Odor After Cleansing: No Slough/Fibrino No Wound Bed Granulation Amount: Large (67-100%)  Exposed Structure Granulation Quality: Red, Pink Fascia Exposed: No Necrotic Amount: Small (1-33%) Fat Layer (Subcutaneous Tissue) Exposed: Yes Necrotic Quality: Adherent Slough Tendon Exposed: No Muscle Exposed: No Joint Exposed: No Bone Exposed: No Periwound Skin Texture Texture Color No Abnormalities Noted: No No Abnormalities Noted: Yes Callus: Yes Temperature / Pain Temperature: No Abnormality Moisture No Abnormalities Noted: No Dry / Scaly: Yes Maceration: No Treatment Notes Wound #1 (Calcaneus) Wound Laterality: Left Cleanser Normal Saline Discharge Instruction: Cleanse the wound with Normal Saline prior to applying a clean dressing using gauze sponges, not tissue or cotton balls. Soap and Water Discharge Instruction: May shower and wash wound with dial antibacterial soap and water prior to dressing change. Wound Cleanser Discharge Instruction: Cleanse the wound with wound cleanser prior to applying a clean dressing using gauze sponges, not tissue or cotton balls. Peri-Wound Care Destiny [name], Destiny [name] ([id number]) [id number] Page 5 of 5 Topical Primary Dressing TheraSkin Secondary Dressing ADAPTIC TOUCH 3x4.25 in Discharge Instruction: Apply over  primary dressing as directed. Woven Gauze Sponge, Non-Sterile 4x4 in Discharge Instruction: Apply over primary dressing as directed. Zetuvit Plus Silicone Border Heel [id number] (in/in) Discharge Instruction: Apply silicone border over primary dressing as directed. Secured With [employer], 4.5x3.1 (in/yd) Discharge Instruction: Secure with Kerlix as directed. 32M Medipore Soft Cloth Surgical T 2x10 (in/yd) ape Discharge Instruction: Secure with tape as directed. Compression Wrap Compression Stockings Add-Ons Electronic Signature(s) Signed: 11/20/2022 3:36:20 PM By: Destiny East RN Entered By: Destiny [name] on 11/20/2022 15:28:13 -------------------------------------------------------------------------------- Vitals Details Patient Name: Date of Service: Destiny [name]. 11/20/2022 3:30 PM Medical Record Number: [id number] Patient Account Number: [id number] Date of Birth/Sex: Treating RN: 29-Nov-1945 (77 y.o. Destiny [name] Primary Care Keeya Dyckman: Destiny [name] Other Clinician: Referring Amirra Herling: Treating Crewe Heathman/Extender: Destiny [name] in Treatment: 26 Vital Signs Time Taken: 15:23 Reference Range: 80 - 120 mg / dl Height (in): 63 Weight (lbs): 182 Body Mass Index (BMI): 32.2 Electronic Signature(s) Signed: 11/20/2022 3:36:20 PM By: Destiny East RN Entered By: Destiny [name] on 11/20/2022 15:23:14

## 2022-11-21 NOTE — Progress Notes (Signed)
XANTHIA, KURIHARA ([id number]) [id number] Page 1 of 1 Visit Report for 11/20/2022 SuperBill Details Patient Name: Date of Service: Destiny [name], Destiny [name] 11/20/2022 Medical Record Number: [id number] Patient Account Number: [id number] Date of Birth/Sex: Treating RN: 1946/08/22 (77 y.o. Iver Nestle, Jamie Primary Care Provider: Deland Pretty Other Clinician: Referring Provider: Treating Provider/Extender: Raylene Everts in Treatment: 26 Diagnosis Coding ICD-10 Codes Code Description [phone] Non-pressure chronic ulcer of left heel and midfoot with fat layer exposed T81.31XS Disruption of external operation (surgical) wound, not elsewhere classified, sequela C43.9 Malignant melanoma of skin, unspecified C20 Malignant neoplasm of rectum E11.9 Type 2 diabetes mellitus without complications [id number] Personal history of antineoplastic chemotherapy Z92.3 Personal history of irradiation Facility Procedures CPT4 Code Description Modifier Quantity [id number] - WOUND CARE VISIT-LEV 3 EST PT 25 1 Electronic Signature(s) Signed: 11/20/2022 3:34:13 PM By: Blanche East RN Signed: 11/21/2022 7:56:07 AM By: Fredirick Maudlin MD FACS Entered By: Blanche East on 11/20/2022 15:34:13

## 2022-11-27 ENCOUNTER — Encounter (HOSPITAL_BASED_OUTPATIENT_CLINIC_OR_DEPARTMENT_OTHER): Payer: PPO | Admitting: General Surgery

## 2022-11-27 DIAGNOSIS — E11621 Type 2 diabetes mellitus with foot ulcer: Secondary | ICD-10-CM | POA: Diagnosis not present

## 2022-11-27 DIAGNOSIS — L97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: Secondary | ICD-10-CM | POA: Diagnosis not present

## 2022-11-29 NOTE — Progress Notes (Signed)
ZEHAVA, HARDNETT ([id number]) [id number] Page 1 of 7 Visit Report for 11/27/2022 Arrival Information Details Patient Name: Date of Service: Destiny [name], Destiny [name] 11/27/2022 8:15 A M Medical Record Number: [id number] Patient Account Number: [id number] Date of Birth/Sex: Treating RN: 12/26/1945 (77 y.o. Elam Dutch Primary Care Iysis Germain: Deland Pretty Other Clinician: Referring Surafel Hilleary: Treating Irish Piech/Extender: Raylene Everts in Treatment: 27 Visit Information History Since Last Visit Added or deleted any medications: No Patient Arrived: Ambulatory Any new allergies or adverse reactions: No Arrival Time: 08:15 Had a fall or experienced change in No Accompanied By: self activities of daily living that may affect Transfer Assistance: None risk of falls: Patient Identification Verified: Yes Signs or symptoms of abuse/neglect since last No Secondary Verification Process Completed: Yes visito Patient Requires Transmission-Based Precautions: No Hospitalized since last visit: No Patient Has Alerts: No Implantable device outside of the clinic excluding No cellular tissue based products placed in the center since last visit: Has Dressing in Place as Prescribed: Yes Has Footwear/Offloading in Place as Prescribed: Yes Left: Other:globoped heel offloader Pain Present Now: Yes Electronic Signature(s) Signed: 11/27/2022 5:36:36 PM By: Baruch Gouty RN, BSN Entered By: Baruch Gouty on 11/27/2022 08:22:26 -------------------------------------------------------------------------------- Encounter Discharge Information Details Patient Name: Date of Service: Destiny Dark F. 11/27/2022 8:15 A M Medical Record Number: [id number] Patient Account Number: [id number] Date of Birth/Sex: Treating RN: March 24, 1946 (77 y.o. Elam Dutch Primary Care Kanishk Stroebel: Deland Pretty Other Clinician: Referring Jasen Hartstein: Treating Boss Danielsen/Extender:  Raylene Everts in Treatment: 27 Encounter Discharge Information Items Post Procedure Vitals Discharge Condition: Stable Temperature (F): 98.1 Ambulatory Status: Ambulatory Pulse (bpm): 69 Discharge Destination: Home Respiratory Rate (breaths/min): 18 Transportation: Private Auto Blood Pressure (mmHg): 139/83 Accompanied By: self Schedule Follow-up Appointment: Yes Clinical Summary of Care: Patient Declined Electronic Signature(s) Signed: 11/27/2022 5:36:36 PM By: Baruch Gouty RN, BSN Entered By: Baruch Gouty on 11/27/2022 09:05:40 [name] ([id number] Page 2 of 7 -------------------------------------------------------------------------------- Lower Extremity Assessment Details Patient Name: Date of Service: Destiny [name], Destiny [name] 11/27/2022 8:15 A M Medical Record Number: [id number] Patient Account Number: [id number] Date of Birth/Sex: Treating RN: 06/13/1946 (77 y.o. Elam Dutch Primary Care Joandy Burget: Deland Pretty Other Clinician: Referring Amiera Herzberg: Treating Ezrie Bunyan/Extender: Raylene Everts in Treatment: 27 Edema Assessment Assessed: [name]: No] [Right: No] Edema: [Left: N] [Right: o] Calf Left: Right: Point of Measurement: 29 cm From Medial Instep 36.5 cm Ankle Left: Right: Point of Measurement: 10 cm From Medial Instep 22 cm Vascular Assessment Pulses: Dorsalis Pedis Palpable: [Left:Yes] Electronic Signature(s) Signed: 11/27/2022 5:36:36 PM By: Baruch Gouty RN, BSN Entered By: Baruch Gouty on 11/27/2022 08:29:21 -------------------------------------------------------------------------------- Multi Wound Chart Details Patient Name: Date of Service: Destiny Dark F. 11/27/2022 8:15 A M Medical Record Number: [id number] Patient Account Number: [id number] Date of Birth/Sex: Treating RN: 1945/11/27 (77 y.o. F) Primary Care Mishti Swanton: Deland Pretty Other  Clinician: Referring Marceil Welp: Treating Daxx Tiggs/Extender: Raylene Everts in Treatment: 27 Vital Signs Height(in): 63 Pulse(bpm): 46 Weight(lbs): 182 Blood Pressure(mmHg): 139/83 Body Mass Index(BMI): 32.2 Temperature(F): 98.1 Respiratory Rate(breaths/min): 18 [1:Photos:] [N/A:N/A] Left Calcaneus N/A N/A Wound Location: Hematoma N/A N/A Wounding Event: Diabetic Wound/Ulcer of the Lower N/A N/A Primary Etiology: Extremity Type II Diabetes, Osteoarthritis N/A N/A Comorbid History: 05/05/2022 N/A N/A Date Acquired: 17 N/A N/A Weeks of Treatment: Open N/A N/A Wound Status: No N/A N/A Wound Recurrence: 2.4x1.2x0.1 N/A N/A Measurements L x W x D (cm) 2.262 N/A N/A A (cm) : rea  0.226 N/A N/A Volume (cm) : 66.80% N/A N/A % Reduction in A rea: 83.40% N/A N/A % Reduction in Volume: Grade 1 N/A N/A Classification: Medium N/A N/A Exudate A mount: Serosanguineous N/A N/A Exudate Type: red, brown N/A N/A Exudate Color: Flat and Intact N/A N/A Wound Margin: Large (67-100%) N/A N/A Granulation A mount: Red N/A N/A Granulation Quality: Small (1-33%) N/A N/A Necrotic A mount: Fat Layer (Subcutaneous Tissue): Yes N/A N/A Exposed Structures: Fascia: No Tendon: No Muscle: No Joint: No Bone: No Small (1-33%) N/A N/A Epithelialization: Debridement - Selective/Open Wound N/A N/A Debridement: Pre-procedure Verification/Time Out 08:40 N/A N/A Taken: Lidocaine 4% Topical Solution N/A N/A Pain Control: Necrotic/Eschar N/A N/A Tissue Debrided: Skin/Epidermis N/A N/A Level: 2.88 N/A N/A Debridement A (sq cm): rea Curette N/A N/A Instrument: Minimum N/A N/A Bleeding: Pressure N/A N/A Hemostasis A chieved: 0 N/A N/A Procedural Pain: 0 N/A N/A Post Procedural Pain: Procedure was tolerated well N/A N/A Debridement Treatment Response: 2.4x1.4x0.1 N/A N/A Post Debridement Measurements L x W x D (cm) 0.264 N/A N/A Post Debridement  Volume: (cm) Callus: Yes N/A N/A Periwound Skin Texture: Dry/Scaly: Yes N/A N/A Periwound Skin Moisture: Maceration: No No Abnormalities Noted N/A N/A Periwound Skin Color: No Abnormality N/A N/A Temperature: Debridement N/A N/A Procedures Performed: Treatment Notes Electronic Signature(s) Signed: 11/27/2022 8:50:01 AM By: Fredirick Maudlin MD FACS Entered By: Fredirick Maudlin on 11/27/2022 08:50:01 -------------------------------------------------------------------------------- Multi-Disciplinary Care Plan Details Patient Name: Date of Service: [name] 11/27/2022 8:15 A M Medical Record Number: [id number] Patient Account Number: [id number] Date of Birth/Sex: Treating RN: 1946-05-31 (77 y.o. Elam Dutch Primary Care Lilianne Delair: Deland Pretty Other Clinician: Referring Kyland No: Treating Adonay Scheier/Extender: Raylene Everts in Treatment: [place] reviewed with physician [address] ASHAY, BARRERE ([id number]) [id number] Page 4 of 7 Necrotic Tissue Nursing Diagnoses: Impaired tissue integrity related to necrotic/devitalized tissue Knowledge deficit related to management of necrotic/devitalized tissue Goals: Necrotic/devitalized tissue will be minimized in the wound bed Date Initiated: 08/08/2022 Target Resolution Date: 12/04/2022 Goal Status: Active Patient/caregiver will verbalize understanding of reason and process for debridement of necrotic tissue Date Initiated: 08/08/2022 Target Resolution Date: 12/04/2022 Goal Status: Active Interventions: Assess patient pain level pre-, during and post procedure and prior to discharge Provide education on necrotic tissue and debridement process Treatment Activities: Apply topical anesthetic as ordered : 08/08/2022 Notes: Wound/Skin Impairment Nursing Diagnoses: Impaired tissue integrity Goals: Patient/caregiver will verbalize understanding  of skin care regimen Date Initiated: 09/03/2022 Target Resolution Date: 12/04/2022 Goal Status: Active Interventions: Assess ulceration(s) every visit Treatment Activities: Skin care regimen initiated : 05/19/2022 Notes: Electronic Signature(s) Signed: 11/27/2022 5:36:36 PM By: Baruch Gouty RN, BSN Entered By: Baruch Gouty on 11/27/2022 08:34:37 -------------------------------------------------------------------------------- Pain Assessment Details Patient Name: Date of Service: Destiny Dark F. 11/27/2022 8:15 A M Medical Record Number: [id number] Patient Account Number: [id number] Date of Birth/Sex: Treating RN: Sep 28, 1946 (77 y.o. Elam Dutch Primary Care Joah Patlan: Deland Pretty Other Clinician: Referring Javarius Tsosie: Treating Brode Sculley/Extender: Raylene Everts in Treatment: 27 Active Problems Location of Pain Severity and Description of Pain Patient Has Paino Yes Site Locations Pain LocationAYEESHA, CONRADI ([id number]) [id number] Page 5 of 7 Pain Location: Pain in Ulcers With Dressing Change: Yes Duration of the Pain. Constant / Intermittento Intermittent Rate the pain. Current Pain Level: 0 Worst Pain Level: 8 Least Pain Level: 0 Character of Pain Describe the Pain: Burning Pain Management and Medication Current Pain Management: Rest: Yes Is the Current Pain  Management Adequate: Adequate How does your wound impact your activities of daily livingo Sleep: No Bathing: No Appetite: No Relationship With Others: No Bladder Continence: No Emotions: No Bowel Continence: No Work: No Toileting: No Drive: No Dressing: No Hobbies: No Electronic Signature(s) Signed: 11/27/2022 5:36:36 PM By: Baruch Gouty RN, BSN Entered By: Baruch Gouty on 11/27/2022 08:25:06 -------------------------------------------------------------------------------- Patient/Caregiver Education Details Patient Name: Date of  Service: Destiny [name], Destiny TTIE F. 2/15/2024andnbsp8:15 A M Medical Record Number: [id number] Patient Account Number: [id number] Date of Birth/Gender: Treating RN: [birth year] (77 y.o. Elam Dutch Primary Care Physician: Deland Pretty Other Clinician: Referring Physician: Treating Physician/Extender: Raylene Everts in Treatment: 27 Education Assessment Education Provided To: Patient Education Topics Provided Offloading: Methods: Explain/Verbal Responses: Reinforcements needed, State content correctly Wound/Skin Impairment: Methods: Explain/Verbal Responses: Reinforcements needed, State content correctly Electronic Signature(s) Signed: 11/27/2022 5:36:36 PM By: Baruch Gouty RN, BSN Destiny [name], Destiny [name] ([id number]) [id number] Page 6 of 7 Entered By: Baruch Gouty on 11/27/2022 08:35:00 -------------------------------------------------------------------------------- Wound Assessment Details Patient Name: Date of Service: Destiny [name], Destiny [name] 11/27/2022 8:15 A M Medical Record Number: [id number] Patient Account Number: [id number] Date of Birth/Sex: Treating RN: [birth year] (77 y.o. Elam Dutch Primary Care Marieelena Bartko: Deland Pretty Other Clinician: Referring Kealie Barrie: Treating Koleman Marling/Extender: Raylene Everts in Treatment: 27 Wound Status Wound Number: 1 Primary Etiology: Diabetic Wound/Ulcer of the Lower Extremity Wound Location: Left Calcaneus Wound Status: Open Wounding Event: Hematoma Comorbid History: Type II Diabetes, Osteoarthritis Date Acquired: 05/05/2022 Weeks Of Treatment: 27 Clustered Wound: No Photos Wound Measurements Length: (cm) 2.4 Width: (cm) 1.2 Depth: (cm) 0.1 Area: (cm) 2.262 Volume: (cm) 0.226 % Reduction in Area: 66.8% % Reduction in Volume: 83.4% Epithelialization: Small (1-33%) Tunneling: No Undermining: No Wound Description Classification: Grade 1 Wound Margin:  Flat and Intact Exudate Amount: Medium Exudate Type: Serosanguineous Exudate Color: red, brown Foul Odor After Cleansing: No Slough/Fibrino No Wound Bed Granulation Amount: Large (67-100%) Exposed Structure Granulation Quality: Red Fascia Exposed: No Necrotic Amount: Small (1-33%) Fat Layer (Subcutaneous Tissue) Exposed: Yes Tendon Exposed: No Muscle Exposed: No Joint Exposed: No Bone Exposed: No Periwound Skin Texture Texture Color No Abnormalities Noted: No No Abnormalities Noted: Yes Callus: Yes Temperature / Pain Temperature: No Abnormality Moisture No Abnormalities Noted: Yes Treatment Notes Destiny [name], Destiny [name] ([id number] Page 7 of 7 Wound #1 (Calcaneus) Wound Laterality: Left Cleanser Normal Saline Discharge Instruction: Cleanse the wound with Normal Saline prior to applying a clean dressing using gauze sponges, not tissue or cotton balls. Soap and Water Discharge Instruction: May shower and wash wound with dial antibacterial soap and water prior to dressing change. Wound Cleanser Discharge Instruction: Cleanse the wound with wound cleanser prior to applying a clean dressing using gauze sponges, not tissue or cotton balls. Peri-Wound Care Topical Primary Dressing TheraSkin Secondary Dressing ADAPTIC TOUCH 3x4.25 in Discharge Instruction: Apply over primary dressing as directed. Woven Gauze Sponge, Non-Sterile 4x4 in Discharge Instruction: Apply over primary dressing as directed. Secured With [employer], 4.5x3.1 (in/yd) Discharge Instruction: Secure with Kerlix as directed. 38M Medipore Soft Cloth Surgical T 2x10 (in/yd) ape Discharge Instruction: Secure with tape as directed. Compression Wrap Compression Stockings Add-Ons Electronic Signature(s) Signed: 11/27/2022 5:36:36 PM By: Baruch Gouty RN, BSN Entered By: Baruch Gouty on 11/27/2022  08:46:00 -------------------------------------------------------------------------------- Vitals Details Patient Name: Date of Service: Destiny Dark F. 11/27/2022 8:15 A M Medical Record Number: [id number] Patient Account Number: [id number] Date of Birth/Sex: Treating RN: [birth year] (77 y.o. Elam Dutch Primary Care Teauna Dubach:  Deland Pretty Other Clinician: Referring Fantashia Shupert: Treating Keoni Havey/Extender: Raylene Everts in Treatment: 27 Vital Signs Time Taken: 08:23 Temperature (F): 98.1 Height (in): 63 Pulse (bpm): 69 Weight (lbs): 182 Respiratory Rate (breaths/min): 18 Body Mass Index (BMI): 32.2 Blood Pressure (mmHg): 139/83 Reference Range: 80 - 120 mg / dl Electronic Signature(s) Signed: 11/27/2022 5:36:36 PM By: Baruch Gouty RN, BSN Entered By: Baruch Gouty on 11/27/2022 08:24:16

## 2022-11-29 NOTE — Progress Notes (Signed)
[name], [name] ([id number]) [id number] Page 1 of 10 Visit Report for 11/27/2022 Chief Complaint Document Details Patient Name: Date of Service: Destiny [name], Destiny [name] 11/27/2022 8:15 A M Medical Record Number: [id number] Patient Account Number: [id number] Date of Birth/Sex: Treating RN: [birth year] (77 y.o. F) Primary Care Provider: Deland Pretty Other Clinician: Referring Provider: Treating Provider/Extender: Raylene Everts in Treatment: 27 Information Obtained from: Patient Chief Complaint Patient presents to the wound care center with open non-healing surgical wound(s) Electronic Signature(s) Signed: 11/27/2022 8:50:08 AM By: Fredirick Maudlin MD FACS Entered By: Fredirick Maudlin on 11/27/2022 08:50:07 -------------------------------------------------------------------------------- Cellular or Tissue Based Product Details Patient Name: Date of Service: Destiny, [name] 11/27/2022 8:15 A M Medical Record Number: [id number] Patient Account Number: [id number] Date of Birth/Sex: Treating RN: Jun 02, 1946 (77 y.o. Elam Dutch Primary Care Provider: Deland Pretty Other Clinician: Referring Provider: Treating Provider/Extender: Raylene Everts in Treatment: 27 Cellular or Tissue Based Product Type Wound #1 Left Calcaneus Applied to: Performed By: Physician Fredirick Maudlin, MD Cellular or Tissue Based Product Type: Theraskin Level of Consciousness (Pre-procedure): Awake and Alert Pre-procedure Verification/Time Out Yes - 08:45 Taken: Location: genitalia / hands / feet / multiple digits Wound Size (sq cm): 2.88 Product Size (sq cm): 6.5 Waste Size (sq cm): 0 Amount of Product Applied (sq cm): 6.5 Instrument Used: Forceps, Scissors Lot #: [id number] Order #: 2 Expiration Date: 07/30/2027 Fenestrated: No Reconstituted: Yes Solution Type: saline Solution Amount: 20 ml Lot #: [id number] Solution Expiration  Date: 03/12/2025 Secured: Yes Secured With: Steri-Strips Dressing Applied: Yes Primary Dressing: adaptic, gauze Procedural Pain: 0 Post Procedural Pain: 0 Response to Treatment: Procedure was tolerated well Level of Consciousness (Post- Awake and Alert KEIGHLEY, NITSCH ([id number]) [id number] Page 2 of 10 procedure): Post Procedure Diagnosis Same as Pre-procedure Electronic Signature(s) Signed: 11/27/2022 9:50:30 AM By: Fredirick Maudlin MD FACS Signed: 11/27/2022 5:36:36 PM By: Baruch Gouty RN, BSN Entered By: Baruch Gouty on 11/27/2022 08:50:24 -------------------------------------------------------------------------------- Debridement Details Patient Name: Date of Service: Destiny [name]. 11/27/2022 8:15 A M Medical Record Number: [id number] Patient Account Number: [id number] Date of Birth/Sex: Treating RN: May 16, 1946 (77 y.o. Elam Dutch Primary Care Provider: Deland Pretty Other Clinician: Referring Provider: Treating Provider/Extender: Raylene Everts in Treatment: 27 Debridement Performed for Assessment: Wound #1 Left Calcaneus Performed By: Physician Fredirick Maudlin, MD Debridement Type: Debridement Severity of Tissue Pre Debridement: Fat layer exposed Level of Consciousness (Pre-procedure): Awake and Alert Pre-procedure Verification/Time Out Yes - 08:40 Taken: Start Time: 08:42 Pain Control: Lidocaine 4% T opical Solution T Area Debrided (L x W): otal 2.4 (cm) x 1.2 (cm) = 2.88 (cm) Tissue and other material debrided: Non-Viable, Eschar, Skin: Epidermis, Fibrin/Exudate Level: Skin/Epidermis Debridement Description: Selective/Open Wound Instrument: Curette Bleeding: Minimum Hemostasis Achieved: Pressure Procedural Pain: 0 Post Procedural Pain: 0 Response to Treatment: Procedure was tolerated well Level of Consciousness (Post- Awake and Alert procedure): Post Debridement Measurements of Total  Wound Length: (cm) 2.4 Width: (cm) 1.4 Depth: (cm) 0.1 Volume: (cm) 0.264 Character of Wound/Ulcer Post Debridement: Improved Severity of Tissue Post Debridement: Fat layer exposed Post Procedure Diagnosis Same as Pre-procedure Notes Scribed for Dr. Celine Ahr by Baruch Gouty, RN Electronic Signature(s) Signed: 11/27/2022 9:50:30 AM By: Fredirick Maudlin MD FACS Signed: 11/27/2022 5:36:36 PM By: Baruch Gouty RN, BSN Entered By: Baruch Gouty on 11/27/2022 08:45:49 Binette, Lurene Shadow ([id number] Page 3 of 10 -------------------------------------------------------------------------------- HPI Details Patient Name: Date of Service: Destiny, [name]. 11/27/2022 8:15 A  M Medical Record Number: [id number] Patient Account Number: [id number] Date of Birth/Sex: Treating RN: 02/12/1946 (77 y.o. 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 After the final reexcision, the wound had an ACell graft applied. She reports that she had good closure of skin over the site. Earlier this year, at the end of May, however, she was diagnosed with rectal cancer and is currently undergoing neoadjuvant therapy (chemotherapy and radiation) in anticipation of future surgery. She reports that about 2 weeks ago, she noted that her skin coverage over the heel was beginning to break down. Her oncologist felt that perhaps the Xeloda was contributing to wound failure and stopped this medication. She continues to receive neoadjuvant radiation therapy, however. She saw plastic surgery last week, and they referred her to the wound care center for further evaluation and management. She is a type  II diabetic, well controlled, with the most recent A1c available to me being 5.9%. ABIs done 1 year ago were normal. On the patient's left heel, there is an irregular wound with some skin maceration. The fat layer is exposed and there is slough over the entire surface. Outside of the area of maceration, the skin is in good condition. No erythema, induration, or odor. No concern for infection. 05/26/2022: The wound is smaller today and is extremely clean, without any slough buildup. The periwound skin is in much better condition and is no longer macerated. She is scheduled to complete her radiation treatment on Wednesday. 06/03/2022: The wound continues to contract. There is just a little bit of eschar and callus accumulation. The surface is clean. She has completed all of her neoadjuvant therapy and is awaiting surgery in November. 06/10/2022: The wound is smaller by about a centimeter in all dimensions. She does have a little bit of eschar near the Achilles portion of the wound. The wound surface itself is robust and healthy-appearing. 06/24/2022: The wound continues to contract. There is just a small portion open in the center. She does have some dry skin and periwound callus circumferentially. No concern for infection. 07/08/2022: The wound is down to just a pinhole. There is a little bit of eschar on the surface. No concern for infection. 07/22/2022: Her wound is healed. 08/08/2022: Unfortunately, it seems that when her wound was being dressed at her last visit, the nurse applying the dressing pulled some dead skin off and reopened to the wound. I am not sure why this was not brought to my attention, but the patient has been caring for the wound at home with her supplies from her previous admission. She has been wearing her heel off loader and using Hydrofera Blue. There is some eschar accumulation, but no erythema, induration, or other concern for infection. Her colorectal surgery is scheduled for  next Wednesday. 09/03/2022: Ms. Chiou returns after undergoing her colon surgery. She has been dressing the wound at home with [facility] [place], but has not been wearing her heel cup. The wound is more open today with a lot of periwound callus, dry skin, and a light layer of slough. No overt signs of infection. 09/17/2022: The wound is little bit smaller today. There is some periwound tissue maceration. She says she has been wearing her heel cup more regularly. 10/01/2022: The wound looks a little smaller in  terms of the open dimensions. There is more fresh epithelium present. The periwound skin is actually a bit dry and cracked with peeling skin. 10/16/2022: The periwound skin is extremely dry, thickened, and cracked. The wound is about the same size. There is slough on the wound surface. 10/30/2022: The periwound skin is dry but not nearly as thickened or cracked. The wound is a little bit smaller and more superficial. Minimal slough accumulation. She apparently was unable to get the endoform from the DME supplier so when she ran out of the leftovers from her clinic visit, she went back to using [facility]. 11/13/2022: There is a lot of dry skin around the wound. Otherwise the surface is clean. She has been approved for [employer]. 11/27/2022: The wound surface has robust granulation tissue and there is epithelium creeping around the perimeter. Electronic Signature(s) Signed: 11/27/2022 8:58:14 AM By: Fredirick Maudlin MD FACS Entered By: Fredirick Maudlin on 11/27/2022 08:58:14 Physical Exam Details -------------------------------------------------------------------------------- [name] ([id number] Page 4 of 10 Patient Name: Date of Service: MIYUKI, ORCHARD 11/27/2022 8:15 A M Medical Record Number: [id number] Patient Account Number: [id number] Date of Birth/Sex: Treating RN: [birth year] (77 y.o. F) Primary Care Provider: Deland Pretty Other  Clinician: Referring Provider: Treating Provider/Extender: Raylene Everts in Treatment: 27 Constitutional . . . . no acute distress. Respiratory Normal work of breathing on room air. Notes 11/27/2022: The wound surface has robust granulation tissue and there is epithelium creeping around the perimeter. Electronic Signature(s) Signed: 11/27/2022 8:59:00 AM By: Fredirick Maudlin MD FACS Entered By: Fredirick Maudlin on 11/27/2022 08:58:59 -------------------------------------------------------------------------------- Physician Orders Details Patient Name: Date of Service: [name] 11/27/2022 8:15 A M Medical Record Number: [id number] Patient Account Number: [id number] Date of Birth/Sex: Treating RN: 1946/01/30 (77 y.o. Elam Dutch Primary Care Provider: Deland Pretty Other Clinician: Referring Provider: Treating Provider/Extender: Raylene Everts in Treatment: 27 Verbal / Phone Orders: No Diagnosis Coding ICD-10 Coding Code Description [id number] Non-pressure chronic ulcer of left heel and midfoot with fat layer exposed T81.31XS Disruption of external operation (surgical) wound, not elsewhere classified, sequela C43.9 Malignant melanoma of skin, unspecified C20 Malignant neoplasm of rectum E11.9 Type 2 diabetes mellitus without complications [id number] Personal history of antineoplastic chemotherapy Z92.3 Personal history of irradiation Follow-up Appointments ppointment in 2 weeks. - Dr. Celine Ahr RM 1 Return A Thursday 2/29 @ 08:15 am Anesthetic Wound #1 Left Calcaneus (In clinic) Topical Lidocaine 4% applied to wound bed Cellular or Tissue Based Products Wound #1 Left Calcaneus Cellular or Tissue Based Product Type: - Theraskin #2 Cellular or Tissue Based Product applied to wound bed, secured with steri-strips, cover with Adaptic or Mepitel. (DO NOT REMOVE). Bathing/ Shower/ Hygiene May shower with protection but do not get  wound dressing(s) wet. Protect dressing(s) with water repellant cover (for example, large plastic bag) or a cast cover and may then take shower. Edema Control - Lymphedema / SCD / Other Avoid standing for long periods of time. Moisturize legs daily. Off-Loading ALDER, TUBB ([id number]) [id number] Page 5 of 10 Other: - Please wear off-loading shoe on left foot to keep pressure off the heel Wound Treatment Wound #1 - Calcaneus Wound Laterality: Left Cleanser: Normal Saline (Generic) 1 x Per Week/30 Days Discharge Instructions: Cleanse the wound with Normal Saline prior to applying a clean dressing using gauze sponges, not tissue or cotton balls. Cleanser: Soap and Water 1 x Per Week/30 Days Discharge Instructions: May shower and wash wound with dial  antibacterial soap and water prior to dressing change. Cleanser: Wound Cleanser 1 x Per Week/30 Days Discharge Instructions: Cleanse the wound with wound cleanser prior to applying a clean dressing using gauze sponges, not tissue or cotton balls. Prim Dressing: TheraSkin ary 1 x Per Week/30 Days Secondary Dressing: ADAPTIC TOUCH 3x4.25 in 1 x Per Week/30 Days Discharge Instructions: Apply over primary dressing as directed. Secondary Dressing: Woven Gauze Sponge, Non-Sterile 4x4 in (Generic) 1 x Per Week/30 Days Discharge Instructions: Apply over primary dressing as directed. Secured With: [employer], 4.5x3.1 (in/yd) (Generic) 1 x Per Week/30 Days Discharge Instructions: Secure with Kerlix as directed. Secured With: 80M Medipore Public affairs consultant Surgical T 2x10 (in/yd) (Generic) 1 x Per Week/30 Days ape Discharge Instructions: Secure with tape as directed. Electronic Signature(s) Signed: 11/27/2022 9:50:30 AM By: Fredirick Maudlin MD FACS Entered By: Fredirick Maudlin on 11/27/2022 08:59:55 -------------------------------------------------------------------------------- Problem List Details Patient Name: Date  of Service: [name] 11/27/2022 8:15 A M Medical Record Number: [id number] Patient Account Number: [id number] Date of Birth/Sex: Treating RN: [birth year] (77 y.o. Elam Dutch Primary Care Provider: Deland Pretty Other Clinician: Referring Provider: Treating Provider/Extender: Raylene Everts in Treatment: 27 Active Problems ICD-10 Encounter Code Description Active Date MDM Diagnosis [phone] Non-pressure chronic ulcer of left heel and midfoot with fat layer exposed 05/19/2022 No Yes T81.31XS Disruption of external operation (surgical) wound, not elsewhere classified, 05/19/2022 No Yes sequela C43.9 Malignant melanoma of skin, unspecified 05/19/2022 No Yes C20 Malignant neoplasm of rectum 05/19/2022 No Yes E11.9 Type 2 diabetes mellitus without complications [id number] No Yes YAILIN, SWEAZY ([id number]) [phone].pdf Page 6 of 10 Z92.21 Personal history of antineoplastic chemotherapy 05/19/2022 No Yes Z92.3 Personal history of irradiation 05/19/2022 No Yes Inactive Problems Resolved Problems Electronic Signature(s) Signed: 11/27/2022 8:49:44 AM By: Fredirick Maudlin MD FACS Entered By: Fredirick Maudlin on 11/27/2022 08:49:44 -------------------------------------------------------------------------------- Progress Note Details Patient Name: Date of Service: [name] 11/27/2022 8:15 A M Medical Record Number: [id number] Patient Account Number: [id number] Date of Birth/Sex: Treating RN: 1946/08/27 (77 y.o. F) Primary Care Provider: Deland Pretty Other Clinician: Referring Provider: Treating Provider/Extender: Raylene Everts in Treatment: 27 Subjective Chief Complaint Information obtained from Patient Patient presents to the wound care center with open non-healing surgical wound(s) History of Present Illness (HPI) ADMISSION 05/19/2022 This is a 77 year old woman who underwent several  excisions/reexcisions of a melanoma from her left heel in 2022. After the final reexcision, the wound had an ACell graft applied. She reports that she had good closure of skin over the site. Earlier this year, at the end of May, however, she was diagnosed with rectal cancer and is currently undergoing neoadjuvant therapy (chemotherapy and radiation) in anticipation of future surgery. She reports that about 2 weeks ago, she noted that her skin coverage over the heel was beginning to break down. Her oncologist felt that perhaps the Xeloda was contributing to wound failure and stopped this medication. She continues to receive neoadjuvant radiation therapy, however. She saw plastic surgery last week, and they referred her to the wound care center for further evaluation and management. She is a type II diabetic, well controlled, with the most recent A1c available to me being 5.9%. ABIs done 1 year ago were normal. On the patient's left heel, there is an irregular wound with some skin maceration. The fat layer is exposed and there is slough over the entire surface. Outside of the area of maceration, the skin is in good condition.  No erythema, induration, or odor. No concern for infection. 05/26/2022: The wound is smaller today and is extremely clean, without any slough buildup. The periwound skin is in much better condition and is no longer macerated. She is scheduled to complete her radiation treatment on Wednesday. 06/03/2022: The wound continues to contract. There is just a little bit of eschar and callus accumulation. The surface is clean. She has completed all of her neoadjuvant therapy and is awaiting surgery in November. 06/10/2022: The wound is smaller by about a centimeter in all dimensions. She does have a little bit of eschar near the Achilles portion of the wound. The wound surface itself is robust and healthy-appearing. 06/24/2022: The wound continues to contract. There is just a small portion open  in the center. She does have some dry skin and periwound callus circumferentially. No concern for infection. 07/08/2022: The wound is down to just a pinhole. There is a little bit of eschar on the surface. No concern for infection. 07/22/2022: Her wound is healed. 08/08/2022: Unfortunately, it seems that when her wound was being dressed at her last visit, the nurse applying the dressing pulled some dead skin off and reopened to the wound. I am not sure why this was not brought to my attention, but the patient has been caring for the wound at home with her supplies from her previous admission. She has been wearing her heel off loader and using Hydrofera Blue. There is some eschar accumulation, but no erythema, induration, or other concern for infection. Her colorectal surgery is scheduled for next Wednesday. 09/03/2022: Ms. Prada returns after undergoing her colon surgery. She has been dressing the wound at home with [facility], but has not been wearing her heel cup. The wound is more open today with a lot of periwound callus, dry skin, and a light layer of slough. No overt signs of infection. 09/17/2022: The wound is little bit smaller today. There is some periwound tissue maceration. She says she has been wearing her heel cup more regularly. 10/01/2022: The wound looks a little smaller in terms of the open dimensions. There is more fresh epithelium present. The periwound skin is actually a bit dry LEONIA, ARMAN ([id number]) [id number] Page 7 of 10 and cracked with peeling skin. 10/16/2022: The periwound skin is extremely dry, thickened, and cracked. The wound is about the same size. There is slough on the wound surface. 10/30/2022: The periwound skin is dry but not nearly as thickened or cracked. The wound is a little bit smaller and more superficial. Minimal slough accumulation. She apparently was unable to get the endoform from the DME supplier so when she ran out  of the leftovers from her clinic visit, she went back to using [facility]. 11/13/2022: There is a lot of dry skin around the wound. Otherwise the surface is clean. She has been approved for [employer]. 11/27/2022: The wound surface has robust granulation tissue and there is epithelium creeping around the perimeter. Patient History Information obtained from Patient. Social History Former smoker - 1991, Marital Status - Divorced, Alcohol Use - Never, Drug Use - No History, Caffeine Use - Moderate - Diet coke,coffee. Medical History Endocrine Patient has history of Type II Diabetes - Metformin pillsand Ozempic Musculoskeletal Patient has history of Osteoarthritis Hospitalization/Surgery History - Melanoma excision to left heel;ORIF Left ankle fracture;Marland Kitchen - rectal surgery. Medical A Surgical History Notes nd Eyes Macular Degeneration Ear/Nose/Mouth/Throat Tinnitus Oncologic 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 has had Chemotherapy (stopped) and Radiation -8 treatments left as  of 05/19/22 Rectal cancer Psychiatric Hx Depression Objective Constitutional no acute distress. Vitals Time Taken: 8:23 AM, Height: 63 in, Weight: 182 lbs, BMI: 32.2, Temperature: 98.1 F, Pulse: 69 bpm, Respiratory Rate: 18 breaths/min, Blood Pressure: 139/83 mmHg. Respiratory Normal work of breathing on room air. General Notes: 11/27/2022: The wound surface has robust granulation tissue and there is epithelium creeping around the perimeter. Integumentary (Hair, Skin) Wound #1 status is Open. Original cause of wound was Hematoma. The date acquired was: 05/05/2022. The wound has been in treatment 27 weeks. The wound is located on the Left Calcaneus. The wound measures 2.4cm length x 1.2cm width x 0.1cm depth; 2.262cm^2 area and 0.226cm^3 volume. There is Fat Layer (Subcutaneous Tissue) exposed. There is no tunneling or undermining noted. There is a medium amount of serosanguineous drainage noted. The wound margin is flat and  intact. There is large (67-100%) red granulation within the wound bed. There is a small (1-33%) amount of necrotic tissue within the wound bed. The periwound skin appearance had no abnormalities noted for moisture. The periwound skin appearance had no abnormalities noted for color. The periwound skin appearance exhibited: Callus. Periwound temperature was noted as No Abnormality. Assessment Active Problems ICD-10 Non-pressure chronic ulcer of left heel and midfoot with fat layer exposed Disruption of external operation (surgical) wound, not elsewhere classified, sequela Malignant melanoma of skin, unspecified Malignant neoplasm of rectum Type 2 diabetes mellitus without complications Personal history of antineoplastic chemotherapy Personal history of irradiation GLORINE, GERARDO ([id number] Page 8 of 10 Procedures Wound #1 Pre-procedure diagnosis of Wound #1 is a Diabetic Wound/Ulcer of the Lower Extremity located on the Left Calcaneus .Severity of Tissue Pre Debridement is: Fat layer exposed. There was a Selective/Open Wound Skin/Epidermis Debridement with a total area of 2.88 sq cm performed by Fredirick Maudlin, MD. With the following instrument(s): Curette to remove Non-Viable tissue/material. Material removed includes Eschar, Skin: Epidermis, and Fibrin/Exudate after achieving pain control using Lidocaine 4% T opical Solution. No specimens were taken. A time out was conducted at 08:40, prior to the start of the procedure. A Minimum amount of bleeding was controlled with Pressure. The procedure was tolerated well with a pain level of 0 throughout and a pain level of 0 following the procedure. Post Debridement Measurements: 2.4cm length x 1.4cm width x 0.1cm depth; 0.264cm^3 volume. Character of Wound/Ulcer Post Debridement is improved. Severity of Tissue Post Debridement is: Fat layer exposed. Post procedure Diagnosis Wound #1: Same as  Pre-Procedure General Notes: Scribed for Dr. Celine Ahr by Baruch Gouty, RN. Pre-procedure diagnosis of Wound #1 is a Diabetic Wound/Ulcer of the Lower Extremity located on the Left Calcaneus. A skin graft procedure using a bioengineered skin substitute/cellular or tissue based product was performed by Fredirick Maudlin, MD with the following instrument(s): Forceps and Scissors. Theraskin was applied and secured with Steri-Strips. 6.5 sq cm of product was utilized and 0 sq cm was wasted. Post Application, adaptic, gauze was applied. A Time Out was conducted at 08:45, prior to the start of the procedure. The procedure was tolerated well with a pain level of 0 throughout and a pain level of 0 following the procedure. Post procedure Diagnosis Wound #1: Same as Pre-Procedure . Plan Follow-up Appointments: Return Appointment in 2 weeks. - Dr. Celine Ahr RM 1 Thursday 2/29 @ 08:15 am Anesthetic: Wound #1 Left Calcaneus: (In clinic) Topical Lidocaine 4% applied to wound bed Cellular or Tissue Based Products: Wound #1 Left Calcaneus: Cellular or Tissue Based Product Type: - Theraskin #2 Cellular  or Tissue Based Product applied to wound bed, secured with steri-strips, cover with Adaptic or Mepitel. (DO NOT REMOVE). Bathing/ Shower/ Hygiene: May shower with protection but do not get wound dressing(s) wet. Protect dressing(s) with water repellant cover (for example, large plastic bag) or a cast cover and may then take shower. Edema Control - Lymphedema / SCD / Other: Avoid standing for long periods of time. Moisturize legs daily. Off-Loading: Other: - Please wear off-loading shoe on left foot to keep pressure off the heel WOUND #1: - Calcaneus Wound Laterality: Left Cleanser: Normal Saline (Generic) 1 x Per Week/30 Days Discharge Instructions: Cleanse the wound with Normal Saline prior to applying a clean dressing using gauze sponges, not tissue or cotton balls. Cleanser: Soap and Water 1 x Per Week/30  Days Discharge Instructions: May shower and wash wound with dial antibacterial soap and water prior to dressing change. Cleanser: Wound Cleanser 1 x Per Week/30 Days Discharge Instructions: Cleanse the wound with wound cleanser prior to applying a clean dressing using gauze sponges, not tissue or cotton balls. Prim Dressing: TheraSkin 1 x Per Week/30 Days ary Secondary Dressing: ADAPTIC TOUCH 3x4.25 in 1 x Per Week/30 Days Discharge Instructions: Apply over primary dressing as directed. Secondary Dressing: Woven Gauze Sponge, Non-Sterile 4x4 in (Generic) 1 x Per Week/30 Days Discharge Instructions: Apply over primary dressing as directed. Secured With: [employer], 4.5x3.1 (in/yd) (Generic) 1 x Per Week/30 Days Discharge Instructions: Secure with Kerlix as directed. Secured With: 40M Medipore Public affairs consultant Surgical T 2x10 (in/yd) (Generic) 1 x Per Week/30 Days ape Discharge Instructions: Secure with tape as directed. 11/27/2022: The wound surface has robust granulation tissue and there is epithelium creeping around the perimeter. I used a curette to debride some eschar and slough from the wound. TheraSkin was then thawed and applied in standard fashion. It was secured with Adaptic and Steri-Strips. She will have a nurse visit for a dressing change next week and see me in 2 weeks' time. Electronic Signature(s) Signed: 11/27/2022 9:00:59 AM By: Fredirick Maudlin MD FACS Entered By: Fredirick Maudlin on 11/27/2022 09:00:59 [name] ([id number] Page 9 of 10 -------------------------------------------------------------------------------- HxROS Details Patient Name: Date of Service: GRACILYN, LAFORCE 11/27/2022 8:15 A M Medical Record Number: [id number] Patient Account Number: [id number] Date of Birth/Sex: Treating RN: 1946/10/13 (77 y.o. F) Primary Care Provider: Deland Pretty Other Clinician: Referring Provider: Treating Provider/Extender:  Raylene Everts in Treatment: 27 Information Obtained From Patient Eyes Medical History: Past Medical History Notes: Macular Degeneration Ear/Nose/Mouth/Throat Medical History: Past Medical History Notes: Tinnitus Endocrine Medical History: Positive for: Type II Diabetes - Metformin pillsand Ozempic Time with diabetes: 25 years Treated with: Oral agents Blood sugar tested every day: Yes Tested : every day Musculoskeletal Medical History: Positive for: Osteoarthritis Oncologic Medical History: Past Medical History Notes: 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 has had Chemotherapy (stopped) and Radiation -8 treatments left as of 05/19/22 Rectal cancer Psychiatric Medical History: Past Medical History Notes: Hx Depression Immunizations Pneumococcal Vaccine: Received Pneumococcal Vaccination: Yes Received Pneumococcal Vaccination On or After 60th Birthday: Yes Implantable Devices None Hospitalization / Surgery History Type of Hospitalization/Surgery Melanoma excision to left heel;ORIF Left ankle fracture; rectal surgery Family and Social History Former smoker - 1991; Marital Status - Divorced; Alcohol Use: Never; Drug Use: No History; Caffeine Use: Moderate - Diet coke,coffee; Financial Concerns: No; Food, Clothing or Shelter Needs: No; Support System Lacking: No; Transportation Concerns: No Electronic Signature(s) Signed: 11/27/2022 9:50:30 AM By: Fredirick Maudlin MD FACS  Entered By: Fredirick Maudlin on 11/27/2022 08:58:24 Piggott, Lurene Shadow ([id number] Page 10 of 10 -------------------------------------------------------------------------------- SuperBill Details Patient Name: Date of Service: ROCHELLE, SEPULVADO 11/27/2022 Medical Record Number: [id number] Patient Account Number: [id number] Date of Birth/Sex: Treating RN: October 29, 1945 (77 y.o. F) Primary Care Provider: Deland Pretty Other Clinician: Referring  Provider: Treating Provider/Extender: Raylene Everts in Treatment: 27 Diagnosis Coding ICD-10 Codes Code Description [phone] Non-pressure chronic ulcer of left heel and midfoot with fat layer exposed T81.31XS Disruption of external operation (surgical) wound, not elsewhere classified, sequela C43.9 Malignant melanoma of skin, unspecified C20 Malignant neoplasm of rectum E11.9 Type 2 diabetes mellitus without complications [id number] Personal history of antineoplastic chemotherapy Z92.3 Personal history of irradiation Facility Procedures : CPT4 Code: [id number] Description: [id number] - SKIN SUB GRAFT FACE/NK/HF/G ICD-10 Diagnosis Description L97.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 Modifier: Quantity: 1 : Sylvia Code: [id number] Description: [id number]- [place] per 1sq cm extra small -6 sq cm Modifier: Quantity: 6 Physician Procedures : CPT4 Code Description Modifier [id number] - WC PHYS LEVEL 3 - EST PT 25 ICD-10 Diagnosis Description L97.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 T81.31XS Disruption of external operation (surgical) wound, not  elsewhere classified, sequela C43.9 Malignant melanoma of skin, unspecified Z92.3 Personal history of irradiation Quantity: 1 : [id number] - WC PHYS SKIN SUB GRAFT FACE/NK/HF/G ICD-10 Diagnosis Description L97.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 Quantity: 1 Electronic Signature(s) Signed: 11/27/2022 9:50:30 AM By: Fredirick Maudlin MD FACS Signed: 11/27/2022 5:36:36 PM By: Baruch Gouty RN, BSN Previous Signature: 11/27/2022 9:03:28 AM Version By: Fredirick Maudlin MD FACS Entered By: Baruch Gouty on 11/27/2022 09:04:34

## 2022-12-03 ENCOUNTER — Encounter: Payer: PPO | Admitting: Physical Therapy

## 2022-12-11 ENCOUNTER — Encounter (HOSPITAL_BASED_OUTPATIENT_CLINIC_OR_DEPARTMENT_OTHER): Payer: PPO | Admitting: General Surgery

## 2022-12-11 DIAGNOSIS — E11621 Type 2 diabetes mellitus with foot ulcer: Secondary | ICD-10-CM | POA: Diagnosis not present

## 2022-12-11 DIAGNOSIS — L97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: Secondary | ICD-10-CM | POA: Diagnosis not present

## 2022-12-17 NOTE — Progress Notes (Signed)
EOLA, BOUDOIN ([id number]) [id number] Page 1 of 7 Visit Report for 12/11/2022 Arrival Information Details Patient Name: Date of Service: Destiny [name], Destiny [name] 12/11/2022 8:15 A M Medical Record Number: [id number] Patient Account Number: [id number] Date of Birth/Sex: Treating RN: 1946/09/14 (77 y.o. Iver Nestle, Jamie Primary Care Terree Gaultney: Deland Pretty Other Clinician: Referring Larita Deremer: Treating Lailyn Appelbaum/Extender: Raylene Everts in Treatment: 29 Visit Information History Since Last Visit Added or deleted any medications: No Patient Arrived: Ambulatory Any new allergies or adverse reactions: No Arrival Time: 08:15 Had a fall or experienced change in No Accompanied By: self activities of daily living that may affect Transfer Assistance: None risk of falls: Patient Requires Transmission-Based Precautions: No Signs or symptoms of abuse/neglect since last visito No Patient Has Alerts: No Hospitalized since last visit: No Implantable device outside of the clinic excluding No cellular tissue based products placed in the center since last visit: Has Dressing in Place as Prescribed: Yes Pain Present Now: No Electronic Signature(s) Signed: 12/11/2022 4:27:41 PM By: Blanche East RN Entered By: Blanche East on 12/11/2022 08:15:30 -------------------------------------------------------------------------------- Encounter Discharge Information Details Patient Name: Date of Service: Destiny Dark F. 12/11/2022 8:15 A M Medical Record Number: [id number] Patient Account Number: [id number] Date of Birth/Sex: Treating RN: [birth year] (77 y.o. Elam Dutch Primary Care Kali Deadwyler: Deland Pretty Other Clinician: Referring Marianela Mandrell: Treating Samaiya Awadallah/Extender: Raylene Everts in Treatment: 29 Encounter Discharge Information Items Post Procedure Vitals Discharge Condition: Stable Temperature (F): 98.1 Ambulatory Status:  Ambulatory Pulse (bpm): 74 Discharge Destination: Home Respiratory Rate (breaths/min): 18 Transportation: Private Auto Blood Pressure (mmHg): 135/83 Accompanied By: self Schedule Follow-up Appointment: Yes Clinical Summary of Care: Patient Declined Electronic Signature(s) Signed: 12/11/2022 4:59:17 PM By: Baruch Gouty RN, BSN Entered By: Baruch Gouty on 12/11/2022 08:57:24 -------------------------------------------------------------------------------- Lower Extremity Assessment Details Patient Name: Date of Service: Destiny Dark F. 12/11/2022 8:15 A M Medical Record Number: [id number] Patient Account Number: [id number] Date of Birth/Sex: Treating RN: 11-Mar-1946 (77 y.o. Destiny [name] Primary Care Jameika Kinn: Deland Pretty Other Clinician: Referring Lakeyta Vandenheuvel: Treating Earleen Aoun/Extender: Raylene Everts in Treatment: 29 Edema Assessment Assessed: [name]: No] Patrice Paradise: No] P[LeftFIZA, Destiny [name] [id number] [Right: [id number] Page 2 of 7] Edema: [Left: N] [Right: o] Calf Left: Right: Point of Measurement: 29 cm From Medial Instep 35 cm Ankle Left: Right: Point of Measurement: 10 cm From Medial Instep 21.3 cm Vascular Assessment Pulses: Dorsalis Pedis Palpable: [Left:Yes] Electronic Signature(s) Signed: 12/11/2022 4:27:41 PM By: Blanche East RN Entered By: Blanche East on 12/11/2022 08:16:03 -------------------------------------------------------------------------------- Multi Wound Chart Details Patient Name: Date of Service: Destiny [name]. 12/11/2022 8:15 A M Medical Record Number: [id number] Patient Account Number: [id number] Date of Birth/Sex: Treating RN: 04/20/1946 (77 y.o. F) Primary Care Atreus Hasz: Deland Pretty Other Clinician: Referring Frankie Scipio: Treating Emmily Pellegrin/Extender: Raylene Everts in Treatment: 29 Vital Signs Height(in): 63 Pulse(bpm): 74 Weight(lbs): 182 Blood  Pressure(mmHg): 135/83 Body Mass Index(BMI): 32.2 Temperature(F): 98.1 Respiratory Rate(breaths/min): 16 [1:Photos:] [N/A:N/A] Left Calcaneus N/A N/A Wound Location: Hematoma N/A N/A Wounding Event: Diabetic Wound/Ulcer of the Lower N/A N/A Primary Etiology: Extremity Type II Diabetes, Osteoarthritis N/A N/A Comorbid History: 05/05/2022 N/A N/A Date Acquired: 33 N/A N/A Weeks of Treatment: Open N/A N/A Wound Status: No N/A N/A Wound Recurrence: 2.4x1.2x0.1 N/A N/A Measurements L x W x D (cm) 2.262 N/A N/A A (cm) : rea 0.226 N/A N/A Volume (cm) : 66.80% N/A N/A % Reduction in A rea: 83.40% N/A N/A % Reduction in  Volume: Grade 1 N/A N/A Classification: Medium N/A N/A Exudate A mount: Serosanguineous N/A N/A Exudate Type: red, brown N/A N/A Exudate Color: Flat and Intact N/A N/A Wound Margin: Large (67-100%) N/A N/A Granulation A mount: Red N/A N/A Granulation Quality: Small (1-33%) N/A N/A Necrotic A mount: Fat Layer (Subcutaneous Tissue): Yes N/A N/A Exposed Structures: Fascia: No Tendon: No GENTRI, CUTILLO ([id number]) [id number] Page 3 of 7 Muscle: No Joint: No Bone: No Small (1-33%) N/A N/A Epithelialization: Debridement - Selective/Open Wound N/A N/A Debridement: Pre-procedure Verification/Time Out 08:30 N/A N/A Taken: Lidocaine 4% Topical Solution N/A N/A Pain Control: Necrotic/Eschar, Slough N/A N/A Tissue Debrided: Non-Viable Tissue N/A N/A Level: 2.88 N/A N/A Debridement A (sq cm): rea Curette N/A N/A Instrument: Minimum N/A N/A Bleeding: Pressure N/A N/A Hemostasis A chieved: 0 N/A N/A Procedural Pain: 0 N/A N/A Post Procedural Pain: Procedure was tolerated well N/A N/A Debridement Treatment Response: 2.4x1.2x0.1 N/A N/A Post Debridement Measurements L x W x D (cm) 0.226 N/A N/A Post Debridement Volume: (cm) Callus: Yes N/A N/A Periwound Skin Texture: Dry/Scaly: Yes N/A N/A Periwound  Skin Moisture: Maceration: No No Abnormalities Noted N/A N/A Periwound Skin Color: No Abnormality N/A N/A Temperature: Cellular or Tissue Based Product N/A N/A Procedures Performed: Debridement Treatment Notes Electronic Signature(s) Signed: 12/11/2022 8:53:08 AM By: Fredirick Maudlin MD FACS Entered By: Fredirick Maudlin on 12/11/2022 08:53:08 -------------------------------------------------------------------------------- Multi-Disciplinary Care Plan Details Patient Name: Date of Service: Destiny [name]. 12/11/2022 8:15 A M Medical Record Number: [id number] Patient Account Number: [id number] Date of Birth/Sex: Treating RN: July 10, 1946 (77 y.o. Elam Dutch Primary Care Julizza Sassone: Deland Pretty Other Clinician: Referring Azzan Butler: Treating Dorse Locy/Extender: Raylene Everts in Treatment: [place] reviewed with physician Active Inactive Necrotic Tissue Nursing Diagnoses: Impaired tissue integrity related to necrotic/devitalized tissue Knowledge deficit related to management of necrotic/devitalized tissue Goals: Necrotic/devitalized tissue will be minimized in the wound bed Date Initiated: 08/08/2022 Target Resolution Date: 01/01/2023 Goal Status: Active Patient/caregiver will verbalize understanding of reason and process for debridement of necrotic tissue Date Initiated: 08/08/2022 Target Resolution Date: 01/01/2023 Goal Status: Active Interventions: Assess patient pain level pre-, during and post procedure and prior to discharge Provide education on necrotic tissue and debridement process Treatment Activities: Apply topical anesthetic as ordered : 08/08/2022 Notes: Wound/Skin Impairment Nursing Diagnoses: Destiny [name], Destiny [name] ([id number]) [id number] Page 4 of 7 Impaired tissue integrity Goals: Patient/caregiver will verbalize understanding of skin care regimen Date Initiated: 09/03/2022 Target  Resolution Date: 01/01/2023 Goal Status: Active Interventions: Assess ulceration(s) every visit Treatment Activities: Skin care regimen initiated : 05/19/2022 Notes: Electronic Signature(s) Signed: 12/11/2022 4:59:17 PM By: Baruch Gouty RN, BSN Entered By: Baruch Gouty on 12/11/2022 08:48:40 -------------------------------------------------------------------------------- Pain Assessment Details Patient Name: Date of Service: Destiny [name] 12/11/2022 8:15 A M Medical Record Number: [id number] Patient Account Number: [id number] Date of Birth/Sex: Treating RN: Jul 04, 1946 (77 y.o. Destiny [name] Primary Care Amberly Livas: Deland Pretty Other Clinician: Referring Arleta Ostrum: Treating Destiny [name]/Extender: Raylene Everts in Treatment: 29 Active Problems Location of Pain Severity and Description of Pain Patient Has Paino No Site Locations Rate the pain. Current Pain Level: 0 Pain Management and Medication Current Pain Management: Electronic Signature(s) Signed: 12/11/2022 4:27:41 PM By: Blanche East RN Entered By: Blanche East on 12/11/2022 08:15:50 -------------------------------------------------------------------------------- Patient/Caregiver Education Details Patient Name: Date of Service: Destiny [name] 2/29/2024andnbsp8:15 A M Medical Record Number: [id number] Patient Account Number: [id number] Date of Birth/Gender: Treating RN: 1946/03/20 (77 y.o. F) Baruch Gouty Primary  Care Physician: Deland Pretty Other Clinician: Referring Physician: Treating Physician/Extender: Raylene Everts in Treatment: [address] [place] F ([id number]) [id number] Page 5 of 7 Education Assessment Education Provided To: Patient Education Topics Provided Offloading: Methods: Explain/Verbal Responses: Reinforcements needed, State content correctly Wound/Skin Impairment: Methods: Explain/Verbal Responses:  Reinforcements needed, State content correctly Electronic Signature(s) Signed: 12/11/2022 4:59:17 PM By: Baruch Gouty RN, BSN Entered By: Baruch Gouty on 12/11/2022 08:49:24 -------------------------------------------------------------------------------- Wound Assessment Details Patient Name: Date of Service: Destiny Dark F. 12/11/2022 8:15 A M Medical Record Number: [id number] Patient Account Number: [id number] Date of Birth/Sex: Treating RN: August 16, 1946 (77 y.o. Iver Nestle, Jamie Primary Care Ahaan Zobrist: Deland Pretty Other Clinician: Referring Linc Renne: Treating Destiny [name]/Extender: Raylene Everts in Treatment: 29 Wound Status Wound Number: 1 Primary Etiology: Diabetic Wound/Ulcer of the Lower Extremity Wound Location: Left Calcaneus Wound Status: Open Wounding Event: Hematoma Comorbid History: Type II Diabetes, Osteoarthritis Date Acquired: 05/05/2022 Weeks Of Treatment: 29 Clustered Wound: No Photos Wound Measurements Length: (cm) 2.4 Width: (cm) 1.2 Depth: (cm) 0.1 Area: (cm) 2.262 Volume: (cm) 0.226 % Reduction in Area: 66.8% % Reduction in Volume: 83.4% Epithelialization: Small (1-33%) Tunneling: No Undermining: No Wound Description Classification: Grade 1 Wound Margin: Flat and Intact Exudate Amount: Medium Exudate Type: Serosanguineous Exudate Color: red, brown Foul Odor After Cleansing: No Slough/Fibrino No Wound Bed Granulation Amount: Large (67-100%) Exposed Structure Granulation Quality: Red Fascia Exposed: No Necrotic Amount: Small (1-33%) Fat Layer (Subcutaneous Tissue) Exposed: Yes Destiny [name], Destiny [name] ([id number]) [id number] Page 6 of 7 Tendon Exposed: No Muscle Exposed: No Joint Exposed: No Bone Exposed: No Periwound Skin Texture Texture Color No Abnormalities Noted: No No Abnormalities Noted: Yes Callus: Yes Temperature / Pain Temperature: No Abnormality Moisture No Abnormalities Noted:  Yes Treatment Notes Wound #1 (Calcaneus) Wound Laterality: Left Cleanser Normal Saline Discharge Instruction: Cleanse the wound with Normal Saline prior to applying a clean dressing using gauze sponges, not tissue or cotton balls. Soap and Water Discharge Instruction: May shower and wash wound with dial antibacterial soap and water prior to dressing change. Wound Cleanser Discharge Instruction: Cleanse the wound with wound cleanser prior to applying a clean dressing using gauze sponges, not tissue or cotton balls. Peri-Wound Care Topical Primary Dressing TheraSkin Secondary Dressing ADAPTIC TOUCH 3x4.25 in Discharge Instruction: Apply over primary dressing as directed. Woven Gauze Sponge, Non-Sterile 4x4 in Discharge Instruction: Apply over primary dressing as directed. Secured With [employer], 4.5x3.1 (in/yd) Discharge Instruction: Secure with Kerlix as directed. 77M Medipore Soft Cloth Surgical T 2x10 (in/yd) ape Discharge Instruction: Secure with tape as directed. Compression Wrap Compression Stockings Add-Ons Electronic Signature(s) Signed: 12/11/2022 4:27:41 PM By: Blanche East RN Entered By: Blanche East on 12/11/2022 08:21:39 -------------------------------------------------------------------------------- Vitals Details Patient Name: Date of Service: Destiny Dark F. 12/11/2022 8:15 A M Medical Record Number: [id number] Patient Account Number: [id number] Date of Birth/Sex: Treating RN: Jul 27, 1946 (77 y.o. Iver Nestle, Jamie Primary Care Andrick Rust: Deland Pretty Other Clinician: Referring Aislyn Hayse: Treating Jushua Waltman/Extender: Raylene Everts in Treatment: 29 Vital Signs Time Taken: 08:10 Temperature (F): 98.1 Height (in): 63 Pulse (bpm): 74 Weight (lbs): 182 Respiratory Rate (breaths/min): 16 Body Mass Index (BMI): 32.2 Blood Pressure (mmHg): 135/83 Reference Range: 80 - 120 mg / dl Destiny [name], Destiny [name] ([id number])  [id number] Page 7 of 7 Electronic Signature(s) Signed: 12/11/2022 4:27:41 PM By: Blanche East RN Entered By: Blanche East on 12/11/2022 08:15:44

## 2022-12-17 NOTE — Progress Notes (Signed)
AVEA, PAVLOFF ([id number]) [id number] Page 1 of 10 Visit Report for 12/11/2022 Chief Complaint Document Details Patient Name: Date of Service: Destiny [name], Destiny [name] 12/11/2022 8:15 A M Medical Record Number: [id number] Patient Account Number: [id number] Date of Birth/Sex: Treating RN: 08/20/1946 (77 y.o. F) Primary Care Provider: Deland Pretty Other Clinician: Referring Provider: Treating Provider/Extender: Raylene Everts in Treatment: 29 Information Obtained from: Patient Chief Complaint Patient presents to the wound care center with open non-healing surgical wound(s) Electronic Signature(s) Signed: 12/11/2022 8:53:40 AM By: Fredirick Maudlin MD FACS Entered By: Fredirick Maudlin on 12/11/2022 08:53:40 -------------------------------------------------------------------------------- Cellular or Tissue Based Product Details Patient Name: Date of Service: Destiny [name], Destiny [name] 12/11/2022 8:15 A M Medical Record Number: [id number] Patient Account Number: [id number] Date of Birth/Sex: Treating RN: 07/01/1946 (77 y.o. Elam Dutch Primary Care Provider: Deland Pretty Other Clinician: Referring Provider: Treating Provider/Extender: Raylene Everts in Treatment: 29 Cellular or Tissue Based Product Type Wound #1 Left Calcaneus Applied to: Performed By: Physician Fredirick Maudlin, MD Cellular or Tissue Based Product Type: Theraskin Level of Consciousness (Pre-procedure): Awake and Alert Pre-procedure Verification/Time Out Yes - 08:35 Taken: Location: genitalia / hands / feet / multiple digits Wound Size (sq cm): 2.88 Product Size (sq cm): 6 Waste Size (sq cm): 0 Amount of Product Applied (sq cm): 6 Instrument Used: Forceps Lot #: [id number] Order #: 3 Expiration Date: 08/28/2027 Fenestrated: No Reconstituted: Yes Solution Type: saline Solution Amount: 20 ml Lot #: [id number] Solution Expiration Date:  03/12/2025 Secured: Yes Secured With: Steri-Strips Dressing Applied: Yes Primary Dressing: adaptic, gauze Procedural Pain: 0 Post Procedural Pain: 0 Response to Treatment: Procedure was tolerated well Level of Consciousness (Post- Awake and Alert procedure): Post Procedure Diagnosis Same as Pre-procedure Electronic Signature(s) Signed: 12/11/2022 9:31:19 AM By: Fredirick Maudlin MD FACS Signed: 12/11/2022 4:59:17 PM By: Baruch Gouty RN, BSN Magoon, Lurene Shadow ([id number]) [id number] Page 2 of 10 Entered By: Baruch Gouty on 12/11/2022 08:42:19 -------------------------------------------------------------------------------- Debridement Details Patient Name: Date of Service: Destiny [name], Destiny [name] 12/11/2022 8:15 A M Medical Record Number: [id number] Patient Account Number: [id number] Date of Birth/Sex: Treating RN: 1946-02-19 (77 y.o. Elam Dutch Primary Care Provider: Deland Pretty Other Clinician: Referring Provider: Treating Provider/Extender: Raylene Everts in Treatment: 29 Debridement Performed for Assessment: Wound #1 Left Calcaneus Performed By: Physician Fredirick Maudlin, MD Debridement Type: Debridement Severity of Tissue Pre Debridement: Fat layer exposed Level of Consciousness (Pre-procedure): Awake and Alert Pre-procedure Verification/Time Out Yes - 08:30 Taken: Start Time: 08:33 Pain Control: Lidocaine 4% T opical Solution T Area Debrided (L x W): otal 2.4 (cm) x 1.2 (cm) = 2.88 (cm) Tissue and other material debrided: Non-Viable, Eschar, Slough, Slough Level: Non-Viable Tissue Debridement Description: Selective/Open Wound Instrument: Curette Bleeding: Minimum Hemostasis Achieved: Pressure Procedural Pain: 0 Post Procedural Pain: 0 Response to Treatment: Procedure was tolerated well Level of Consciousness (Post- Awake and Alert procedure): Post Debridement Measurements of Total Wound Length: (cm)  2.4 Width: (cm) 1.2 Depth: (cm) 0.1 Volume: (cm) 0.226 Character of Wound/Ulcer Post Debridement: Improved Severity of Tissue Post Debridement: Fat layer exposed Post Procedure Diagnosis Same as Pre-procedure Notes Scribed for Dr. Celine Ahr by Baruch Gouty, RN Electronic Signature(s) Signed: 12/11/2022 9:31:19 AM By: Fredirick Maudlin MD FACS Signed: 12/11/2022 4:59:17 PM By: Baruch Gouty RN, BSN Entered By: Baruch Gouty on 12/11/2022 08:38:51 -------------------------------------------------------------------------------- HPI Details Patient Name: Date of Service: Destiny Dark F. 12/11/2022 8:15 A M Medical Record Number: [id number] Patient Account Number: [id number] Date  of Birth/Sex: Treating RN: Dec 29, 1945 (77 y.o. 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 After the final reexcision, the wound had an ACell graft applied. She reports that she had good closure of skin over the site. Earlier this year, at the end of May, however, she was diagnosed with rectal cancer and is currently undergoing neoadjuvant therapy (chemotherapy and radiation) in anticipation of future surgery. She reports that about 2 weeks ago, she noted that her skin coverage over the heel was beginning to break down. Her oncologist felt that perhaps the Xeloda was contributing to wound failure and stopped this medication. She continues to receive neoadjuvant radiation therapy, however. She saw plastic surgery last week, and they referred her to the [facility], HURSH ([id number]) [id number] Page 3 of 10 wound care center for further evaluation and management. She is a type II diabetic, well  controlled, with the most recent A1c available to me being 5.9%. ABIs done 1 year ago were normal. On the patient's left heel, there is an irregular wound with some skin maceration. The fat layer is exposed and there is slough over the entire surface. Outside of the area of maceration, the skin is in good condition. No erythema, induration, or odor. No concern for infection. 05/26/2022: The wound is smaller today and is extremely clean, without any slough buildup. The periwound skin is in much better condition and is no longer macerated. She is scheduled to complete her radiation treatment on Wednesday. 06/03/2022: The wound continues to contract. There is just a little bit of eschar and callus accumulation. The surface is clean. She has completed all of her neoadjuvant therapy and is awaiting surgery in November. 06/10/2022: The wound is smaller by about a centimeter in all dimensions. She does have a little bit of eschar near the Achilles portion of the wound. The wound surface itself is robust and healthy-appearing. 06/24/2022: The wound continues to contract. There is just a small portion open in the center. She does have some dry skin and periwound callus circumferentially. No concern for infection. 07/08/2022: The wound is down to just a pinhole. There is a little bit of eschar on the surface. No concern for infection. 07/22/2022: Her wound is healed. 08/08/2022: Unfortunately, it seems that when her wound was being dressed at her last visit, the nurse applying the dressing pulled some dead skin off and reopened to the wound. I am not sure why this was not brought to my attention, but the patient has been caring for the wound at home with her supplies from her previous admission. She has been wearing her heel off loader and using Hydrofera Blue. There is some eschar accumulation, but no erythema, induration, or other concern for infection. Her colorectal surgery is scheduled for next  Wednesday. 09/03/2022: Ms. Konkel returns after undergoing her colon surgery. She has been dressing the wound at home with [facility], but has not been wearing her heel cup. The wound is more open today with a lot of periwound callus, dry skin, and a light layer of slough. No overt signs of infection. 09/17/2022: The wound is little bit smaller today. There is some periwound tissue maceration. She says she has been wearing her heel cup more regularly. 10/01/2022: The wound looks a little smaller in terms  of the open dimensions. There is more fresh epithelium present. The periwound skin is actually a bit dry and cracked with peeling skin. 10/16/2022: The periwound skin is extremely dry, thickened, and cracked. The wound is about the same size. There is slough on the wound surface. 10/30/2022: The periwound skin is dry but not nearly as thickened or cracked. The wound is a little bit smaller and more superficial. Minimal slough accumulation. She apparently was unable to get the endoform from the DME supplier so when she ran out of the leftovers from her clinic visit, she went back to using [facility]. 11/13/2022: There is a lot of dry skin around the wound. Otherwise the surface is clean. She has been approved for [employer]. 11/27/2022: The wound surface has robust granulation tissue and there is epithelium creeping around the perimeter. 12/11/2022: There is more epithelium filling in around the wound perimeter. Electronic Signature(s) Signed: 12/11/2022 8:54:10 AM By: Fredirick Maudlin MD FACS Entered By: Fredirick Maudlin on 12/11/2022 08:54:10 -------------------------------------------------------------------------------- Physical Exam Details Patient Name: Date of Service: Destiny [name], Destiny TTIE F. 12/11/2022 8:15 A M Medical Record Number: [id number] Patient Account Number: [id number] Date of Birth/Sex: Treating RN: [birth year] (78 y.o. F) Primary Care Provider: Deland Pretty Other  Clinician: Referring Provider: Treating Provider/Extender: Raylene Everts in Treatment: 29 Constitutional . . . . no acute distress. Respiratory Normal work of breathing on room air. Notes 12/11/2022: There is more epithelium filling in around the wound perimeter. Electronic Signature(s) Signed: 12/11/2022 8:54:36 AM By: Fredirick Maudlin MD FACS Entered By: Fredirick Maudlin on 12/11/2022 08:54:36 Physician Orders Details -------------------------------------------------------------------------------- Destiny [name] ([id number]) [id number] Page 4 of 10 Patient Name: Date of Service: Destiny [name], Destiny [name] 12/11/2022 8:15 A M Medical Record Number: [id number] Patient Account Number: [id number] Date of Birth/Sex: Treating RN: 16-Jun-1946 (77 y.o. Elam Dutch Primary Care Provider: Deland Pretty Other Clinician: Referring Provider: Treating Provider/Extender: Raylene Everts in Treatment: 29 Verbal / Phone Orders: No Diagnosis Coding ICD-10 Coding Code Description [phone] Non-pressure chronic ulcer of left heel and midfoot with fat layer exposed T81.31XS Disruption of external operation (surgical) wound, not elsewhere classified, sequela C43.9 Malignant melanoma of skin, unspecified C20 Malignant neoplasm of rectum E11.9 Type 2 diabetes mellitus without complications [id number] Personal history of antineoplastic chemotherapy Z92.3 Personal history of irradiation Follow-up Appointments ppointment in 2 weeks. - Dr. Celine Ahr RM 1 Return A Friday 3/15 @ 2:00 pm Anesthetic Wound #1 Left Calcaneus (In clinic) Topical Lidocaine 4% applied to wound bed Cellular or Tissue Based Products Wound #1 Left Calcaneus Cellular or Tissue Based Product Type: - Theraskin #3 daptic or Mepitel. (DO NOT REMOVE). - may Cellular or Tissue Based Product applied to wound bed, secured with steri-strips, cover with A change outer  dressing only. apply a thin layer of hydrogel over adaptic with dressing changes Bathing/ Shower/ Hygiene May shower with protection but do not get wound dressing(s) wet. Protect dressing(s) with water repellant cover (for example, large plastic bag) or a cast cover and may then take shower. Edema Control - Lymphedema / SCD / Other Avoid standing for long periods of time. Moisturize legs daily. Off-Loading Other: - Please wear off-loading shoe on left foot to keep pressure off the heel Wound Treatment Wound #1 - Calcaneus Wound Laterality: Left Cleanser: Normal Saline (Generic) 1 x Per Week/30 Days Discharge Instructions: Cleanse the wound with Normal Saline prior to applying a clean dressing using gauze sponges, not tissue or cotton balls. Cleanser: Soap and Water  1 x Per Week/30 Days Discharge Instructions: May shower and wash wound with dial antibacterial soap and water prior to dressing change. Cleanser: Wound Cleanser 1 x Per Week/30 Days Discharge Instructions: Cleanse the wound with wound cleanser prior to applying a clean dressing using gauze sponges, not tissue or cotton balls. Prim Dressing: TheraSkin ary 1 x Per Week/30 Days Secondary Dressing: ADAPTIC TOUCH 3x4.25 in 1 x Per Week/30 Days Discharge Instructions: Apply over primary dressing as directed. Secondary Dressing: Woven Gauze Sponge, Non-Sterile 4x4 in (Generic) 1 x Per Week/30 Days Discharge Instructions: Apply over primary dressing as directed. Secured With: [employer], 4.5x3.1 (in/yd) (Generic) 1 x Per Week/30 Days Discharge Instructions: Secure with Kerlix as directed. Secured With: 66M Medipore Public affairs consultant Surgical T 2x10 (in/yd) (Generic) 1 x Per Week/30 Days ape Discharge Instructions: Secure with tape as directed. Electronic Signature(s) Signed: 12/11/2022 9:31:19 AM By: Fredirick Maudlin MD FACS Destiny [name], Destiny [name] [id number] By: Fredirick Maudlin MD FACS [phone].pdf Page 5 of  10 Signed: 12/11/2022 9:31:19 Entered By: Fredirick Maudlin on 12/11/2022 08:54:51 -------------------------------------------------------------------------------- Problem List Details Patient Name: Date of Service: Destiny [name], Destiny [name] 12/11/2022 8:15 A M Medical Record Number: [id number] Patient Account Number: [id number] Date of Birth/Sex: Treating RN: June 21, 1946 (77 y.o. Elam Dutch Primary Care Provider: Deland Pretty Other Clinician: Referring Provider: Treating Provider/Extender: Raylene Everts in Treatment: 29 Active Problems ICD-10 Encounter Code Description Active Date MDM Diagnosis [phone] Non-pressure chronic ulcer of left heel and midfoot with fat layer exposed 05/19/2022 No Yes T81.31XS Disruption of external operation (surgical) wound, not elsewhere classified, 05/19/2022 No Yes sequela C43.9 Malignant melanoma of skin, unspecified 05/19/2022 No Yes C20 Malignant neoplasm of rectum 05/19/2022 No Yes E11.9 Type 2 diabetes mellitus without complications [id number] No Yes Z92.21 Personal history of antineoplastic chemotherapy 05/19/2022 No Yes Z92.3 Personal history of irradiation 05/19/2022 No Yes Inactive Problems Resolved Problems Electronic Signature(s) Signed: 12/11/2022 8:53:01 AM By: Fredirick Maudlin MD FACS Entered By: Fredirick Maudlin on 12/11/2022 08:53:00 -------------------------------------------------------------------------------- Progress Note Details Patient Name: Date of Service: Destiny Dark F. 12/11/2022 8:15 A M Medical Record Number: [id number] Patient Account Number: [id number] Date of Birth/Sex: Treating RN: November 29, 1945 (77 y.o. 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 After the final reexcision, the wound had an ACell graft applied. She reports that she had good closure of skin over the site. Earlier this year, at the end of May, however, she was diagnosed with rectal cancer and is currently undergoing neoadjuvant therapy (chemotherapy and radiation) in anticipation of future surgery. She reports that about 2 weeks ago, she noted that her skin coverage over the heel was beginning to break down. Her oncologist felt that perhaps the Xeloda was contributing to wound failure and stopped this medication. She continues to receive neoadjuvant radiation therapy, however. She saw plastic surgery last week, and they referred her to the wound care center for further evaluation and management. She is a type II diabetic, well controlled, with the most recent A1c available to me being 5.9%. ABIs done 1 year ago were normal. On the patient's left heel, there is an  irregular wound with some skin maceration. The fat layer is exposed and there is slough over the entire surface. Outside of the area of maceration, the skin is in good condition. No erythema, induration, or odor. No concern for infection. 05/26/2022: The wound is smaller today and is extremely clean, without any slough buildup. The periwound skin is in much better condition and is no longer macerated. She is scheduled to complete her radiation treatment on Wednesday. 06/03/2022: The wound continues to contract. There is just a little bit of eschar and callus accumulation. The surface is clean. She has completed all of her neoadjuvant therapy and is awaiting surgery in November. 06/10/2022: The wound is smaller by about a centimeter in all dimensions. She does have a little  bit of eschar near the Achilles portion of the wound. The wound surface itself is robust and healthy-appearing. 06/24/2022: The wound continues to contract. There is just a small portion open in the center. She does have some dry skin and periwound callus circumferentially. No concern for infection. 07/08/2022: The wound is down to just a pinhole. There is a little bit of eschar on the surface. No concern for infection. 07/22/2022: Her wound is healed. 08/08/2022: Unfortunately, it seems that when her wound was being dressed at her last visit, the nurse applying the dressing pulled some dead skin off and reopened to the wound. I am not sure why this was not brought to my attention, but the patient has been caring for the wound at home with her supplies from her previous admission. She has been wearing her heel off loader and using Hydrofera Blue. There is some eschar accumulation, but no erythema, induration, or other concern for infection. Her colorectal surgery is scheduled for next Wednesday. 09/03/2022: Ms. Garciagonzalez returns after undergoing her colon surgery. She has been dressing the wound at home with [facility], but has not been wearing her heel cup. The wound is more open today with a lot of periwound callus, dry skin, and a light layer of slough. No overt signs of infection. 09/17/2022: The wound is little bit smaller today. There is some periwound tissue maceration. She says she has been wearing her heel cup more regularly. 10/01/2022: The wound looks a little smaller in terms of the open dimensions. There is more fresh epithelium present. The periwound skin is actually a bit dry and cracked with peeling skin. 10/16/2022: The periwound skin is extremely dry, thickened, and cracked. The wound is about the same size. There is slough on the wound surface. 10/30/2022: The periwound skin is dry but not nearly as thickened or cracked. The wound is a little bit smaller and more superficial. Minimal  slough accumulation. She apparently was unable to get the endoform from the DME supplier so when she ran out of the leftovers from her clinic visit, she went back to using [facility] 11/13/2022: There is a lot of dry skin around the wound. Otherwise the surface is clean. She has been approved for [employer]. 11/27/2022: The wound surface has robust granulation tissue and there is epithelium creeping around the perimeter. 12/11/2022: There is more epithelium filling in around the wound perimeter. Patient History Information obtained from Patient. Social History Former smoker - 1991, Marital Status - Divorced, Alcohol Use - Never, Drug Use - No History, Caffeine Use - Moderate - Diet coke,coffee. Medical History Endocrine Patient has history of Type II Diabetes - Metformin pillsand Ozempic Musculoskeletal Patient has history of Osteoarthritis Hospitalization/Surgery History - Melanoma excision to left  heel;ORIF Left ankle fracture;[facility] Kitchen - rectal surgery. Medical A Surgical History Notes nd Eyes Macular Degeneration Ear/Nose/Mouth/Throat Tinnitus Oncologic 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 has had Chemotherapy (stopped) and Radiation -8 treatments left as of 05/19/22 Rectal cancer Psychiatric Hx Depression Destiny [name], Destiny [name] ([id number]) [id number] Page 7 of 10 Objective Constitutional no acute distress. Vitals Time Taken: 8:10 AM, Height: 63 in, Weight: 182 lbs, BMI: 32.2, Temperature: 98.1 F, Pulse: 74 bpm, Respiratory Rate: 16 breaths/min, Blood Pressure: 135/83 mmHg. Respiratory Normal work of breathing on room air. General Notes: 12/11/2022: There is more epithelium filling in around the wound perimeter. Integumentary (Hair, Skin) Wound #1 status is Open. Original cause of wound was Hematoma. The date acquired was: 05/05/2022. The wound has been in treatment 29 weeks. The wound is located on the Left Calcaneus. The wound measures 2.4cm length x 1.2cm width x 0.1cm  depth; 2.262cm^2 area and 0.226cm^3 volume. There is Fat Layer (Subcutaneous Tissue) exposed. There is no tunneling or undermining noted. There is a medium amount of serosanguineous drainage noted. The wound margin is flat and intact. There is large (67-100%) red granulation within the wound bed. There is a small (1-33%) amount of necrotic tissue within the wound bed. The periwound skin appearance had no abnormalities noted for moisture. The periwound skin appearance had no abnormalities noted for color. The periwound skin appearance exhibited: Callus. Periwound temperature was noted as No Abnormality. Assessment Active Problems ICD-10 Non-pressure chronic ulcer of left heel and midfoot with fat layer exposed Disruption of external operation (surgical) wound, not elsewhere classified, sequela Malignant melanoma of skin, unspecified Malignant neoplasm of rectum Type 2 diabetes mellitus without complications Personal history of antineoplastic chemotherapy Personal history of irradiation Procedures Wound #1 Pre-procedure diagnosis of Wound #1 is a Diabetic Wound/Ulcer of the Lower Extremity located on the Left Calcaneus .Severity of Tissue Pre Debridement is: Fat layer exposed. There was a Selective/Open Wound Non-Viable Tissue Debridement with a total area of 2.88 sq cm performed by Fredirick Maudlin, MD. With the following instrument(s): Curette to remove Non-Viable tissue/material. Material removed includes Eschar and Slough and after achieving pain control using Lidocaine 4% T opical Solution. No specimens were taken. A time out was conducted at 08:30, prior to the start of the procedure. A Minimum amount of bleeding was controlled with Pressure. The procedure was tolerated well with a pain level of 0 throughout and a pain level of 0 following the procedure. Post Debridement Measurements: 2.4cm length x 1.2cm width x 0.1cm depth; 0.226cm^3 volume. Character of Wound/Ulcer Post Debridement is  improved. Severity of Tissue Post Debridement is: Fat layer exposed. Post procedure Diagnosis Wound #1: Same as Pre-Procedure General Notes: Scribed for Dr. Celine Ahr by Baruch Gouty, RN. Pre-procedure diagnosis of Wound #1 is a Diabetic Wound/Ulcer of the Lower Extremity located on the Left Calcaneus. A skin graft procedure using a bioengineered skin substitute/cellular or tissue based product was performed by Fredirick Maudlin, MD with the following instrument(s): Forceps. Theraskin was applied and secured with Steri-Strips. 6 sq cm of product was utilized and 0 sq cm was wasted. Post Application, adaptic, gauze was applied. A Time Out was conducted at 08:35, prior to the start of the procedure. The procedure was tolerated well with a pain level of 0 throughout and a pain level of 0 following the procedure. Post procedure Diagnosis Wound #1: Same as Pre-Procedure . Plan Follow-up Appointments: Return Appointment in 2 weeks. - Dr. Celine Ahr RM 1 Friday 3/15 @ 2:00 pm Anesthetic: Wound #1 Left Calcaneus: (In clinic)  Topical Lidocaine 4% applied to wound bed Cellular or Tissue Based Products: Wound #1 Left Calcaneus: Cellular or Tissue Based Product Type: - Theraskin #3 Cellular or Tissue Based Product applied to wound bed, secured with steri-strips, cover with Adaptic or Mepitel. (DO NOT REMOVE). - may change outer dressing only. apply a thin layer of hydrogel over adaptic with dressing changes Bathing/ Shower/ Hygiene: May shower with protection but do not get wound dressing(s) wet. Protect dressing(s) with water repellant cover (for example, large plastic bag) or a cast cover and may then take shower. Edema Control - Lymphedema / SCD / Other: Avoid standing for long periods of time. Moisturize legs daily. Destiny [name], Destiny [name] ([id number]) [id number] Page 8 of 10 Off-Loading: Other: - Please wear off-loading shoe on left foot to keep pressure off the heel WOUND #1:  - Calcaneus Wound Laterality: Left Cleanser: Normal Saline (Generic) 1 x Per Week/30 Days Discharge Instructions: Cleanse the wound with Normal Saline prior to applying a clean dressing using gauze sponges, not tissue or cotton balls. Cleanser: Soap and Water 1 x Per Week/30 Days Discharge Instructions: May shower and wash wound with dial antibacterial soap and water prior to dressing change. Cleanser: Wound Cleanser 1 x Per Week/30 Days Discharge Instructions: Cleanse the wound with wound cleanser prior to applying a clean dressing using gauze sponges, not tissue or cotton balls. Prim Dressing: TheraSkin 1 x Per Week/30 Days ary Secondary Dressing: ADAPTIC TOUCH 3x4.25 in 1 x Per Week/30 Days Discharge Instructions: Apply over primary dressing as directed. Secondary Dressing: Woven Gauze Sponge, Non-Sterile 4x4 in (Generic) 1 x Per Week/30 Days Discharge Instructions: Apply over primary dressing as directed. Secured With: [employer], 4.5x3.1 (in/yd) (Generic) 1 x Per Week/30 Days Discharge Instructions: Secure with Kerlix as directed. Secured With: 81M Medipore Public affairs consultant Surgical T 2x10 (in/yd) (Generic) 1 x Per Week/30 Days ape Discharge Instructions: Secure with tape as directed. 12/11/2022: There is more epithelium filling in around the wound perimeter. I used a curette to debride eschar and slough from the wound. TheraSkin #3 was then prepared and applied in standard fashion to the wound bed. It was secured with Adaptic and Steri-Strips. She will return for a nurse visit next week to have her outer dressing changed and then I will see her in 2 weeks for her next TheraSkin application. Electronic Signature(s) Signed: 12/11/2022 8:55:43 AM By: Fredirick Maudlin MD FACS Entered By: Fredirick Maudlin on 12/11/2022 08:55:43 -------------------------------------------------------------------------------- HxROS Details Patient Name: Date of Service: Destiny Dark F. 12/11/2022 8:15 A  M Medical Record Number: [id number] Patient Account Number: [id number] Date of Birth/Sex: Treating RN: [birth year] (77 y.o. F) Primary Care Provider: Deland Pretty Other Clinician: Referring Provider: Treating Provider/Extender: Raylene Everts in Treatment: 29 Information Obtained From Patient Eyes Medical History: Past Medical History Notes: Macular Degeneration Ear/Nose/Mouth/Throat Medical History: Past Medical History Notes: Tinnitus Endocrine Medical History: Positive for: Type II Diabetes - Metformin pillsand Ozempic Time with diabetes: 25 years Treated with: Oral agents Blood sugar tested every day: Yes Tested : every day Musculoskeletal Medical History: Positive for: Osteoarthritis Oncologic Medical History: Past Medical History Notes: 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 has had Chemotherapy (stopped) and Radiation -8 treatments left as of 05/19/22 Rectal cancer Destiny [name], FIECHTNER ([id number]) [id number] Page 9 of 10 Psychiatric Medical History: Past Medical History Notes: Hx Depression Immunizations Pneumococcal Vaccine: Received Pneumococcal Vaccination: Yes Received Pneumococcal Vaccination On or After 60th Birthday: Yes Implantable Devices None Hospitalization / Surgery History Type of Hospitalization/Surgery Melanoma  excision to left heel;ORIF Left ankle fracture; rectal surgery Family and Social History Former smoker - 1991; Marital Status - Divorced; Alcohol Use: Never; Drug Use: No History; Caffeine Use: Moderate - Diet coke,coffee; Financial Concerns: No; Food, Clothing or Shelter Needs: No; Support System Lacking: No; Transportation Concerns: No Electronic Signature(s) Signed: 12/11/2022 9:31:19 AM By: Fredirick Maudlin MD FACS Entered By: Fredirick Maudlin on 12/11/2022 08:54:17 -------------------------------------------------------------------------------- SuperBill Details Patient Name: Date of  Service: [name] 12/11/2022 Medical Record Number: [id number] Patient Account Number: [id number] Date of Birth/Sex: Treating RN: August 25, 1946 (77 y.o. F) Primary Care Provider: Deland Pretty Other Clinician: Referring Provider: Treating Provider/Extender: Raylene Everts in Treatment: 29 Diagnosis Coding ICD-10 Codes Code Description [phone] Non-pressure chronic ulcer of left heel and midfoot with fat layer exposed T81.31XS Disruption of external operation (surgical) wound, not elsewhere classified, sequela C43.9 Malignant melanoma of skin, unspecified C20 Malignant neoplasm of rectum E11.9 Type 2 diabetes mellitus without complications [id number] Personal history of antineoplastic chemotherapy Z92.3 Personal history of irradiation Facility Procedures : CPT4 Code: [id number] Description: [id number] - SKIN SUB GRAFT FACE/NK/HF/G ICD-10 Diagnosis Description L97.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 Modifier: Quantity: 1 : CPT4 Code: [id number] Description: [id number]- Theraskin per 1sq cm extra small -6 sq cm Modifier: Quantity: 6 Physician Procedures : CPT4 Code Description Modifier [id number] - WC PHYS LEVEL 3 - EST PT 25 ICD-10 Diagnosis Description L97.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 T81.31XS Disruption of external operation (surgical) wound, not  elsewhere classified, sequela E11.9 Type 2 diabetes mellitus without complications [id number] Personal history of antineoplastic chemotherapy SHEVONDA, RIDEAU ([id number]) [id number] - WC PHYS SKIN SUB GRAFT  FACE/NK/HF/G 1 ICD-10 Diagnosis Description L97.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 Quantity: 1 .pdf Page 10 of 10 Electronic Signature(s) Signed: 12/11/2022 4:59:17 PM By: Baruch Gouty RN, BSN Signed: 12/12/2022 9:36:40 AM By: Fredirick Maudlin MD  FACS Previous Signature: 12/11/2022 9:31:19 AM Version By: Fredirick Maudlin MD FACS Previous Signature: 12/11/2022 8:56:14 AM Version By: Fredirick Maudlin MD FACS Entered By: Baruch Gouty on 12/11/2022 16:43:28

## 2022-12-24 ENCOUNTER — Encounter: Payer: Self-pay | Admitting: Gastroenterology

## 2022-12-26 ENCOUNTER — Encounter (HOSPITAL_BASED_OUTPATIENT_CLINIC_OR_DEPARTMENT_OTHER): Payer: PPO | Attending: General Surgery | Admitting: Internal Medicine

## 2022-12-26 DIAGNOSIS — Z923 Personal history of irradiation: Secondary | ICD-10-CM | POA: Insufficient documentation

## 2022-12-26 DIAGNOSIS — T8131XS Disruption of external operation (surgical) wound, not elsewhere classified, sequela: Secondary | ICD-10-CM | POA: Diagnosis not present

## 2022-12-26 DIAGNOSIS — C20 Malignant neoplasm of rectum: Secondary | ICD-10-CM | POA: Insufficient documentation

## 2022-12-26 DIAGNOSIS — Y832 Surgical operation with anastomosis, bypass or graft as the cause of abnormal reaction of the patient, or of later complication, without mention of misadventure at the time of the procedure: Secondary | ICD-10-CM | POA: Diagnosis not present

## 2022-12-26 DIAGNOSIS — Z9221 Personal history of antineoplastic chemotherapy: Secondary | ICD-10-CM | POA: Insufficient documentation

## 2022-12-26 DIAGNOSIS — L97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: Secondary | ICD-10-CM | POA: Diagnosis not present

## 2022-12-26 DIAGNOSIS — Z87891 Personal history of nicotine dependence: Secondary | ICD-10-CM | POA: Diagnosis not present

## 2022-12-26 DIAGNOSIS — E119 Type 2 diabetes mellitus without complications: Secondary | ICD-10-CM

## 2022-12-26 DIAGNOSIS — Y813 Surgical instruments, materials and general- and plastic-surgery devices (including sutures) associated with adverse incidents: Secondary | ICD-10-CM | POA: Diagnosis not present

## 2022-12-26 DIAGNOSIS — Z85820 Personal history of malignant melanoma of skin: Secondary | ICD-10-CM | POA: Insufficient documentation

## 2022-12-30 NOTE — Progress Notes (Signed)
SWANZETTA, STEPHEN ([id number]) [id number] Page 1 of 7 Visit Report for 12/26/2022 Arrival Information Details Patient Name: Date of Service: Destiny [name], Destiny [name] 12/26/2022 2:00 PM Medical Record Number: [id number] Patient Account Number: [id number] Date of Birth/Sex: Treating RN: 08-May-1946 (77 y.o. Iver Nestle, Jamie Primary Care Daiya Tamer: Deland Pretty Other Clinician: Referring Jamoni Broadfoot: Treating Somara Frymire/Extender: Judie Grieve in Treatment: 8 Visit Information History Since Last Visit Added or deleted any medications: No Patient Arrived: Ambulatory Any new allergies or adverse reactions: No Arrival Time: 13:58 Had a fall or experienced change in No Accompanied By: self activities of daily living that may affect Transfer Assistance: None risk of falls: Patient Identification Verified: Yes Signs or symptoms of abuse/neglect since last visito No Secondary Verification Process Completed: Yes Hospitalized since last visit: No Patient Requires Transmission-Based Precautions: No Implantable device outside of the clinic excluding No Patient Has Alerts: No cellular tissue based products placed in the center since last visit: Has Dressing in Place as Prescribed: Yes Pain Present Now: Yes Electronic Signature(s) Signed: 12/29/2022 5:06:20 PM By: Blanche East RN Entered By: Blanche East on 12/26/2022 14:31:11 -------------------------------------------------------------------------------- Encounter Discharge Information Details Patient Name: Date of Service: Destiny Dark F. 12/26/2022 2:00 PM Medical Record Number: [id number] Patient Account Number: [id number] Date of Birth/Sex: Treating RN: [birth year] (77 y.o. [name] Primary Care Keith Cancio: Deland Pretty Other Clinician: Referring Mieko Kneebone: Treating Kenecia Barren/Extender: Judie Grieve in Treatment: 31 Encounter Discharge Information Items Post  Procedure Vitals Discharge Condition: Stable Temperature (F): 97.8 Ambulatory Status: Ambulatory Pulse (bpm): 80 Discharge Destination: Home Respiratory Rate (breaths/min): 18 Transportation: Private Auto Blood Pressure (mmHg): 128/8 Accompanied By: self Schedule Follow-up Appointment: Yes Clinical Summary of Care: Electronic Signature(s) Signed: 12/29/2022 5:06:20 PM By: Blanche East RN Entered By: Blanche East on 12/26/2022 14:30:55 -------------------------------------------------------------------------------- Lower Extremity Assessment Details Patient Name: Date of Service: Destiny [name], Destiny [name] 12/26/2022 2:00 PM Medical Record Number: [id number] Patient Account Number: [id number] Date of Birth/Sex: Treating RN: [birth year] (77 y.o. [name] Primary Care Analynn Daum: Deland Pretty Other Clinician: Referring Blakley Michna: Treating Geraldina Parrott/Extender: Judie Grieve in Treatment: 31 Edema Assessment Assessed: [name]: No] Patrice Paradise: No] P[LeftBEKI, GREASON [id number] Page 2 of 7] Edema: [Left: N] [Right: o] Calf Left: Right: Point of Measurement: 29 cm From Medial Instep 35 cm Ankle Left: Right: Point of Measurement: 10 cm From Medial Instep 21 cm Vascular Assessment Pulses: Dorsalis Pedis Palpable: [Left:Yes] Electronic Signature(s) Signed: 12/29/2022 5:06:20 PM By: Blanche East RN Entered By: Blanche East on 12/26/2022 14:02:17 -------------------------------------------------------------------------------- Multi Wound Chart Details Patient Name: Date of Service: Destiny Dark F. 12/26/2022 2:00 PM Medical Record Number: [id number] Patient Account Number: [id number] Date of Birth/Sex: Treating RN: 12/14/1945 (77 y.o. F) Primary Care Ceonna Frazzini: Deland Pretty Other Clinician: Referring Journei Thomassen: Treating Aleathea Pugmire/Extender: Judie Grieve in Treatment: 31 Vital  Signs Height(in): 63 Pulse(bpm): 80 Weight(lbs): 182 Blood Pressure(mmHg): 128/80 Body Mass Index(BMI): 32.2 Temperature(F): Respiratory Rate(breaths/min): 16 [1:Photos:] [N/A:N/A] Left Calcaneus N/A N/A Wound Location: Hematoma N/A N/A Wounding Event: Diabetic Wound/Ulcer of the Lower N/A N/A Primary Etiology: Extremity Type II Diabetes, Osteoarthritis N/A N/A Comorbid History: 05/05/2022 N/A N/A Date Acquired: 42 N/A N/A Weeks of Treatment: Open N/A N/A Wound Status: No N/A N/A Wound Recurrence: 2.4x0.9x0.1 N/A N/A Measurements L x W x D (cm) 1.696 N/A N/A A (cm) : rea 0.17 N/A N/A Volume (cm) : 75.10% N/A N/A % Reduction in A rea: 87.50% N/A N/A % Reduction  in Volume: Grade 1 N/A N/A Classification: Medium N/A N/A Exudate A mount: Serosanguineous N/A N/A Exudate Type: red, brown N/A N/A Exudate Color: Flat and Intact N/A N/A Wound Margin: Large (67-100%) N/A N/A Granulation A mount: Red N/A N/A Granulation Quality: Small (1-33%) N/A N/A Necrotic A mount: Fat Layer (Subcutaneous Tissue): Yes N/A N/A Exposed Structures: Fascia: No Tendon: No Destiny [name], Destiny [name] ([id number].pdf Page 3 of 7 Muscle: No Joint: No Bone: No Small (1-33%) N/A N/A Epithelialization: Debridement - Selective/Open Wound N/A N/A Debridement: Pre-procedure Verification/Time Out 14:19 N/A N/A Taken: Lidocaine 4% T opical Solution N/A N/A Pain Control: Slough N/A N/A Tissue Debrided: Skin/Epidermis N/A N/A Level: 2.16 N/A N/A Debridement A (sq cm): rea Curette N/A N/A Instrument: Minimum N/A N/A Bleeding: Silver Nitrate N/A N/A Hemostasis A chieved: Procedure was tolerated well N/A N/A Debridement Treatment Response: 2.4x0.9x0.1 N/A N/A Post Debridement Measurements L x W x D (cm) 0.17 N/A N/A Post Debridement Volume: (cm) Callus: Yes N/A N/A Periwound Skin Texture: Dry/Scaly: Yes N/A N/A Periwound Skin  Moisture: Maceration: No No Abnormalities Noted N/A N/A Periwound Skin Color: No Abnormality N/A N/A Temperature: Cellular or Tissue Based Product N/A N/A Procedures Performed: Debridement Treatment Notes Wound #1 (Calcaneus) Wound Laterality: Left Cleanser Normal Saline Discharge Instruction: Cleanse the wound with Normal Saline prior to applying a clean dressing using gauze sponges, not tissue or cotton balls. Soap and Water Discharge Instruction: May shower and wash wound with dial antibacterial soap and water prior to dressing change. Wound Cleanser Discharge Instruction: Cleanse the wound with wound cleanser prior to applying a clean dressing using gauze sponges, not tissue or cotton balls. Peri-Wound Care Topical Primary Dressing TheraSkin Secondary Dressing ADAPTIC TOUCH 3x4.25 in Discharge Instruction: Apply over primary dressing as directed. Woven Gauze Sponge, Non-Sterile 4x4 in Discharge Instruction: Apply over primary dressing as directed. Secured With [employer], 4.5x3.1 (in/yd) Discharge Instruction: Secure with Kerlix as directed. 71M Medipore Soft Cloth Surgical T 2x10 (in/yd) ape Discharge Instruction: Secure with tape as directed. Compression Wrap Compression Stockings Add-Ons Electronic Signature(s) Signed: 12/26/2022 3:54:15 PM By: Kalman Shan DO Entered By: Kalman Shan on 12/26/2022 14:50:22 -------------------------------------------------------------------------------- Multi-Disciplinary Care Plan Details Patient Name: Date of Service: Destiny Dark F. 12/26/2022 2:00 PM Medical Record Number: [id number] Patient Account Number: [id number] Date of Birth/Sex: Treating RN: 04-08-1946 (77 y.o. Iver Nestle, Jamie Primary Care Jamisen Duerson: Deland Pretty Other Clinician: [name] ([id number]) [id number] Page 4 of 7 Referring Mackynzie Woolford: Treating Jrake Rodriquez/Extender: Judie Grieve in  Treatment: Rose[place] reviewed with physician Active Inactive Necrotic Tissue Nursing Diagnoses: Impaired tissue integrity related to necrotic/devitalized tissue Knowledge deficit related to management of necrotic/devitalized tissue Goals: Necrotic/devitalized tissue will be minimized in the wound bed Date Initiated: 08/08/2022 Target Resolution Date: 01/01/2023 Goal Status: Active Patient/caregiver will verbalize understanding of reason and process for debridement of necrotic tissue Date Initiated: 08/08/2022 Target Resolution Date: 01/01/2023 Goal Status: Active Interventions: Assess patient pain level pre-, during and post procedure and prior to discharge Provide education on necrotic tissue and debridement process Treatment Activities: Apply topical anesthetic as ordered : 08/08/2022 Notes: Wound/Skin Impairment Nursing Diagnoses: Impaired tissue integrity Goals: Patient/caregiver will verbalize understanding of skin care regimen Date Initiated: 09/03/2022 Target Resolution Date: 01/01/2023 Goal Status: Active Interventions: Assess ulceration(s) every visit Treatment Activities: Skin care regimen initiated : 05/19/2022 Notes: Electronic Signature(s) Signed: 12/29/2022 5:06:20 PM By: Blanche East RN Entered By: Blanche East on 12/26/2022 14:27:13 -------------------------------------------------------------------------------- Pain Assessment Details Patient Name: Date  of Service: AMANDRA, PELOT 12/26/2022 2:00 PM Medical Record Number: [id number] Patient Account Number: [id number] Date of Birth/Sex: Treating RN: 05-17-1946 (77 y.o. [name] Primary Care Zaylie Gisler: Deland Pretty Other Clinician: Referring Sherrilynn Gudgel: Treating Jadence Kinlaw/Extender: Judie Grieve in Treatment: 31 Active Problems Location of Pain Severity and Description of Pain Patient Has Paino No Site Locations Destiny [name], Destiny [name] ([id number])  [id number] Page 5 of 7 Pain Management and Medication Current Pain Management: Electronic Signature(s) Signed: 12/29/2022 5:06:20 PM By: Blanche East RN Entered By: Blanche East on 12/26/2022 14:01:10 -------------------------------------------------------------------------------- Patient/Caregiver Education Details Patient Name: Date of Service: Destiny [name], Destiny TTIE F. 3/15/2024andnbsp2:00 PM Medical Record Number: [id number] Patient Account Number: [id number] Date of Birth/Gender: Treating RN: Jul 20, 1946 (77 y.o. [name] Primary Care Physician: Deland Pretty Other Clinician: Referring Physician: Treating Physician/Extender: Judie Grieve in Treatment: 31 Education Assessment Education Provided To: Patient Education Topics Provided Wound Debridement: Methods: Explain/Verbal Responses: Reinforcements needed, State content correctly Wound/Skin Impairment: Methods: Explain/Verbal Responses: Reinforcements needed, State content correctly Electronic Signature(s) Signed: 12/29/2022 5:06:20 PM By: Blanche East RN Entered By: Blanche East on 12/26/2022 14:27:33 -------------------------------------------------------------------------------- Wound Assessment Details Patient Name: Date of Service: Destiny Dark F. 12/26/2022 2:00 PM Medical Record Number: [id number] Patient Account Number: [id number] Date of Birth/Sex: Treating RN: 11/22/1945 (77 y.o. [name] Primary Care Clell Trahan: Deland Pretty Other Clinician: Referring Anagha Loseke: Treating Skylan Gift/Extender: Judie Grieve in Treatment: 31 Wound Status Wound Number: 1 Primary Etiology: Diabetic Wound/Ulcer of the Lower Extremity Wound Location: Left Calcaneus Wound Status: Open Destiny [name], Destiny [name] ([id number] Page 6 of 7 Wounding Event: Hematoma Comorbid History: Type II Diabetes, Osteoarthritis Date  Acquired: 05/05/2022 Weeks Of Treatment: 31 Clustered Wound: No Photos Wound Measurements Length: (cm) 2.4 Width: (cm) 0.9 Depth: (cm) 0.1 Area: (cm) 1.696 Volume: (cm) 0.17 % Reduction in Area: 75.1% % Reduction in Volume: 87.5% Epithelialization: Small (1-33%) Tunneling: No Undermining: No Wound Description Classification: Grade 1 Wound Margin: Flat and Intact Exudate Amount: Medium Exudate Type: Serosanguineous Exudate Color: red, brown Foul Odor After Cleansing: No Slough/Fibrino No Wound Bed Granulation Amount: Large (67-100%) Exposed Structure Granulation Quality: Red Fascia Exposed: No Necrotic Amount: Small (1-33%) Fat Layer (Subcutaneous Tissue) Exposed: Yes Tendon Exposed: No Muscle Exposed: No Joint Exposed: No Bone Exposed: No Periwound Skin Texture Texture Color No Abnormalities Noted: No No Abnormalities Noted: Yes Callus: Yes Temperature / Pain Temperature: No Abnormality Moisture No Abnormalities Noted: Yes Treatment Notes Wound #1 (Calcaneus) Wound Laterality: Left Cleanser Normal Saline Discharge Instruction: Cleanse the wound with Normal Saline prior to applying a clean dressing using gauze sponges, not tissue or cotton balls. Soap and Water Discharge Instruction: May shower and wash wound with dial antibacterial soap and water prior to dressing change. Wound Cleanser Discharge Instruction: Cleanse the wound with wound cleanser prior to applying a clean dressing using gauze sponges, not tissue or cotton balls. Peri-Wound Care Topical Primary Dressing TheraSkin Secondary Dressing ADAPTIC TOUCH 3x4.25 in Discharge Instruction: Apply over primary dressing as directed. Woven Gauze Sponge, Non-Sterile 4x4 in Discharge Instruction: Apply over primary dressing as directed. Destiny [name], Destiny [name] ([id number]) [id number] Page 7 of 7 Secured With [employer], 4.5x3.1 (in/yd) Discharge Instruction: Secure with Kerlix  as directed. 40M Medipore Soft Cloth Surgical T 2x10 (in/yd) ape Discharge Instruction: Secure with tape as directed. Compression Wrap Compression Stockings Add-Ons Electronic Signature(s) Signed: 12/29/2022 5:06:20 PM By: Blanche East RN Entered By: Blanche East on 12/26/2022 14:11:44 -------------------------------------------------------------------------------- Vitals Details Patient  Name: Date of Service: Destiny [name], Destiny [name] 12/26/2022 2:00 PM Medical Record Number: [id number] Patient Account Number: [id number] Date of Birth/Sex: Treating RN: 03-09-1946 (77 y.o. Iver Nestle, Jamie Primary Care Orion Mole: Deland Pretty Other Clinician: Referring Ashvik Grundman: Treating Mahrosh Donnell/Extender: Judie Grieve in Treatment: 31 Vital Signs Time Taken: 14:00 Pulse (bpm): 80 Height (in): 63 Respiratory Rate (breaths/min): 16 Weight (lbs): 182 Blood Pressure (mmHg): 128/80 Body Mass Index (BMI): 32.2 Reference Range: 80 - 120 mg / dl Electronic Signature(s) Signed: 12/29/2022 5:06:20 PM By: Blanche East RN Entered By: Blanche East on 12/26/2022 14:08:12

## 2022-12-30 NOTE — Progress Notes (Addendum)
Destiny, [name] ([id number]) [id number] Page 1 of 10 Visit Report for 12/26/2022 Chief Complaint Document Details Patient Name: Date of Service: Destiny [name], Destiny [name] 12/26/2022 2:00 PM Medical Record Number: [id number] Patient Account Number: [id number] Date of Birth/Sex: Treating RN: [birth year] (77 y.o. F) Primary Care Provider: Deland Pretty Other Clinician: Referring Provider: Treating Provider/Extender: Judie Grieve in Treatment: 31 Information Obtained from: Patient Chief Complaint Patient presents to the wound care center with open non-healing surgical wound(s) Electronic Signature(s) Signed: 12/26/2022 3:54:15 PM By: Kalman Shan DO Entered By: Kalman Shan on 12/26/2022 14:50:31 -------------------------------------------------------------------------------- Cellular or Tissue Based Product Details Patient Name: Date of Service: Destiny Dark F. 12/26/2022 2:00 PM Medical Record Number: [id number] Patient Account Number: [id number] Date of Birth/Sex: Treating RN: October 18, 1945 (77 y.o. F) Primary Care Provider: Deland Pretty Other Clinician: Referring Provider: Treating Provider/Extender: Judie Grieve in Treatment: 31 Cellular or Tissue Based Product Type Wound #1 Left Calcaneus Applied to: Performed By: Physician Kalman Shan, DO Cellular or Tissue Based Product Type: Theraskin Level of Consciousness (Pre-procedure): Awake and Alert Pre-procedure Verification/Time Out Yes - 14:22 Taken: Location: genitalia / hands / feet / multiple digits Wound Size (sq cm): 2.16 Product Size (sq cm): 6 Waste Size (sq cm): 2 Waste Reason: size of wound Amount of Product Applied (sq cm): 4 Instrument Used: Forceps, Scissors Lot #: [id number] Order #: 4 Expiration Date: 09/23/2027 Fenestrated: No Reconstituted: Yes Solution Type: normal saline Solution Amount: 30 Lot #: [id number] Solution  Expiration Date: 07/11/2023 Secured: Yes Secured With: Steri-Strips Dressing Applied: Yes Primary Dressing: Adaptec Response to Treatment: Procedure was tolerated well Level of Consciousness (Post- Awake and Alert procedure): Post Procedure Diagnosis Same as Pre-procedure Notes Scribed for Dr. Celine Ahr by Blanche East, RN Electronic Signature(s) Mirna Mires, Lurene Shadow ([id number]) [id number] Page 2 of 10 Signed: 01/05/2023 12:08:29 PM By: Lonell Face Signed: 01/06/2023 10:35:32 AM By: Kalman Shan DO Previous Signature: 12/26/2022 3:54:15 PM Version By: Kalman Shan DO Previous Signature: 12/29/2022 5:06:20 PM Version By: Blanche East RN Entered By: Lonell Face on 01/05/2023 12:08:29 -------------------------------------------------------------------------------- Debridement Details Patient Name: Date of Service: Destiny Dark F. 12/26/2022 2:00 PM Medical Record Number: [id number] Patient Account Number: [id number] Date of Birth/Sex: Treating RN: [birth year] (77 y.o. [place], [place] Primary Care Provider: Deland Pretty Other Clinician: Referring Provider: Treating Provider/Extender: Judie Grieve in Treatment: 31 Debridement Performed for Assessment: Wound #1 Left Calcaneus Performed By: Physician Kalman Shan, DO Debridement Type: Debridement Severity of Tissue Pre Debridement: Fat layer exposed Level of Consciousness (Pre-procedure): Awake and Alert Pre-procedure Verification/Time Out Yes - 14:19 Taken: Start Time: 14:20 Pain Control: Lidocaine 4% T opical Solution T Area Debrided (L x W): otal 2.4 (cm) x 0.9 (cm) = 2.16 (cm) Tissue and other material debrided: Non-Viable, Slough, Skin: Epidermis, Slough Level: Skin/Epidermis Debridement Description: Selective/Open Wound Instrument: Curette Bleeding: Minimum Hemostasis Achieved: Silver Nitrate Response to Treatment: Procedure was tolerated well Level of  Consciousness (Post- Awake and Alert procedure): Post Debridement Measurements of Total Wound Length: (cm) 2.4 Width: (cm) 0.9 Depth: (cm) 0.1 Volume: (cm) 0.17 Character of Wound/Ulcer Post Debridement: Requires Further Debridement Severity of Tissue Post Debridement: Fat layer exposed Post Procedure Diagnosis Same as Pre-procedure Notes Scribed for Dr. Celine Ahr by Blanche East, RN Electronic Signature(s) Signed: 12/26/2022 3:54:15 PM By: Kalman Shan DO Signed: 12/29/2022 5:06:20 PM By: Blanche East RN Entered By: Blanche East on 12/26/2022 14:35:04 -------------------------------------------------------------------------------- HPI Details Patient Name: Date of Service: Destiny Dark F. 12/26/2022  2:00 PM Medical Record Number: [id number] Patient Account Number: [id number] Date of Birth/Sex: Treating RN: 12-28-1945 (77 y.o. 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 After the final reexcision, the wound had an ACell graft applied. She reports that she had good closure of skin over the site. Earlier this year, at the end of May, however, she was diagnosed with rectal cancer and is currently undergoing neoadjuvant therapy (chemotherapy and radiation) in anticipation of future surgery. She reports that about 2 weeks ago, she Destiny, [name] ([id number]) [id number] Page 3 of 10 noted that her skin coverage over the heel was beginning to break down. Her oncologist felt that perhaps the Xeloda was contributing to wound failure and stopped this medication. She continues to receive neoadjuvant radiation therapy, however. She saw plastic surgery last week, and they  referred her to the wound care center for further evaluation and management. She is a type II diabetic, well controlled, with the most recent A1c available to me being 5.9%. ABIs done 1 year ago were normal. On the patient's left heel, there is an irregular wound with some skin maceration. The fat layer is exposed and there is slough over the entire surface. Outside of the area of maceration, the skin is in good condition. No erythema, induration, or odor. No concern for infection. 05/26/2022: The wound is smaller today and is extremely clean, without any slough buildup. The periwound skin is in much better condition and is no longer macerated. She is scheduled to complete her radiation treatment on Wednesday. 06/03/2022: The wound continues to contract. There is just a little bit of eschar and callus accumulation. The surface is clean. She has completed all of her neoadjuvant therapy and is awaiting surgery in November. 06/10/2022: The wound is smaller by about a centimeter in all dimensions. She does have a little bit of eschar near the Achilles portion of the wound. The wound surface itself is robust and healthy-appearing. 06/24/2022: The wound continues to contract. There is just a small portion open in the center. She does have some dry skin and periwound callus circumferentially. No concern for infection. 07/08/2022: The wound is down to just a pinhole. There is a little bit of eschar on the surface. No concern for infection. 07/22/2022: Her wound is healed. 08/08/2022: Unfortunately, it seems that when her wound was being dressed at her last visit, the nurse applying the dressing pulled some dead skin off and reopened to the wound. I am not sure why this was not brought to my attention, but the patient has been caring for the wound at home with her supplies from her previous admission. She has been wearing her heel off loader and using Hydrofera Blue. There is some eschar accumulation, but no  erythema, induration, or other concern for infection. Her colorectal surgery is scheduled for next Wednesday. 09/03/2022: Ms. Hoerner returns after undergoing her colon surgery. She has been dressing the wound at home with [facility], but has not been wearing her heel cup. The wound is more open today with a lot of periwound callus, dry skin, and a light layer of slough. No overt signs of infection. 09/17/2022: The wound is little bit smaller today. There is some periwound tissue maceration. She says she has been wearing her heel cup  more regularly. 10/01/2022: The wound looks a little smaller in terms of the open dimensions. There is more fresh epithelium present. The periwound skin is actually a bit dry and cracked with peeling skin. 10/16/2022: The periwound skin is extremely dry, thickened, and cracked. The wound is about the same size. There is slough on the wound surface. 10/30/2022: The periwound skin is dry but not nearly as thickened or cracked. The wound is a little bit smaller and more superficial. Minimal slough accumulation. She apparently was unable to get the endoform from the DME supplier so when she ran out of the leftovers from her clinic visit, she went back to using [facility]. 11/13/2022: There is a lot of dry skin around the wound. Otherwise the surface is clean. She has been approved for [employer]. 11/27/2022: The wound surface has robust granulation tissue and there is epithelium creeping around the perimeter. 12/11/2022: There is more epithelium filling in around the wound perimeter. 3/15; left calcaneus wounds that we have been placing TheraSkin on. 2 small wounds divided by epithelization. Electronic Signature(s) Signed: 12/26/2022 3:54:15 PM By: Kalman Shan DO Entered By: Kalman Shan on 12/26/2022 14:51:44 -------------------------------------------------------------------------------- Physical Exam Details Patient Name: Date of Service: Destiny Dark F.  12/26/2022 2:00 PM Medical Record Number: [id number] Patient Account Number: [id number] Date of Birth/Sex: Treating RN: [birth year] (77 y.o. F) Primary Care Provider: Deland Pretty Other Clinician: Referring Provider: Treating Provider/Extender: Judie Grieve in Treatment: 31 Constitutional respirations regular, non-labored and within target range for patient.. Cardiovascular 2+ dorsalis pedis/posterior tibialis pulses. Psychiatric pleasant and cooperative. Notes 2 small open wounds advised by epithelization. Wound bed with granulation tissue and slough. Slight maceration to the periwounds. Electronic Signature(s) Signed: 12/26/2022 3:54:15 PM By: Kalman Shan DO Entered By: Kalman Shan on 12/26/2022 14:52:20 Ysaguirre, Lurene Shadow ([id number] Page 4 of 10 -------------------------------------------------------------------------------- Physician Orders Details Patient Name: Date of Service: KENIESHA, CRAVENER 12/26/2022 2:00 PM Medical Record Number: [id number] Patient Account Number: [id number] Date of Birth/Sex: Treating RN: [birth year] (77 y.o. [name] Primary Care Provider: Deland Pretty Other Clinician: Referring Provider: Treating Provider/Extender: Judie Grieve in Treatment: 48 Verbal / Phone Orders: No Diagnosis Coding Follow-up Appointments ppointment in 2 weeks. - Dr. Celine Ahr RM 1 Return A Anesthetic Wound #1 Left Calcaneus (In clinic) Topical Lidocaine 4% applied to wound bed Cellular or Tissue Based Products Wound #1 Left Calcaneus Cellular or Tissue Based Product Type: - Theraskin #4 daptic or Mepitel. (DO NOT REMOVE). - may Cellular or Tissue Based Product applied to wound bed, secured with steri-strips, cover with A change outer dressing only. apply a thin layer of hydrogel over adaptic with dressing changes Bathing/ Shower/ Hygiene May shower with protection but  do not get wound dressing(s) wet. Protect dressing(s) with water repellant cover (for example, large plastic bag) or a cast cover and may then take shower. Edema Control - Lymphedema / SCD / Other Avoid standing for long periods of time. Moisturize legs daily. Off-Loading Other: - Please wear off-loading shoe on left foot to keep pressure off the heel Wound Treatment Wound #1 - Calcaneus Wound Laterality: Left Cleanser: Normal Saline (Generic) 1 x Per Week/30 Days Discharge Instructions: Cleanse the wound with Normal Saline prior to applying a clean dressing using gauze sponges, not tissue or cotton balls. Cleanser: Soap and Water 1 x Per Week/30 Days Discharge Instructions: May shower and wash wound with dial antibacterial soap and water prior to dressing change. Cleanser: Wound Cleanser 1  x Per Week/30 Days Discharge Instructions: Cleanse the wound with wound cleanser prior to applying a clean dressing using gauze sponges, not tissue or cotton balls. Prim Dressing: TheraSkin ary 1 x Per Week/30 Days Secondary Dressing: ADAPTIC TOUCH 3x4.25 in 1 x Per Week/30 Days Discharge Instructions: Apply over primary dressing as directed. Secondary Dressing: Woven Gauze Sponge, Non-Sterile 4x4 in (Generic) 1 x Per Week/30 Days Discharge Instructions: Apply over primary dressing as directed. Secured With: [employer], 4.5x3.1 (in/yd) (Generic) 1 x Per Week/30 Days Discharge Instructions: Secure with Kerlix as directed. Secured With: 80M Medipore Public affairs consultant Surgical T 2x10 (in/yd) (Generic) 1 x Per Week/30 Days ape Discharge Instructions: Secure with tape as directed. Electronic Signature(s) Signed: 12/26/2022 3:54:15 PM By: Kalman Shan DO Entered By: Kalman Shan on 12/26/2022 14:52:30 Problem List Details -------------------------------------------------------------------------------- [name] ([id number] Page 5 of 10 Patient Name:  Date of Service: RAEGENE, GOLDMANN 12/26/2022 2:00 PM Medical Record Number: [id number] Patient Account Number: [id number] Date of Birth/Sex: Treating RN: May 08, 1946 (77 y.o. F) Primary Care Provider: Deland Pretty Other Clinician: Referring Provider: Treating Provider/Extender: Judie Grieve in Treatment: 31 Active Problems ICD-10 Encounter Code Description Active Date MDM Diagnosis L97.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 05/19/2022 No Yes T81.31XS Disruption of external operation (surgical) wound, not elsewhere classified, 05/19/2022 No Yes sequela C43.9 Malignant melanoma of skin, unspecified 05/19/2022 No Yes C20 Malignant neoplasm of rectum 05/19/2022 No Yes E11.9 Type 2 diabetes mellitus without complications 99991111 No Yes Z92.21 Personal history of antineoplastic chemotherapy 05/19/2022 No Yes Z92.3 Personal history of irradiation 05/19/2022 No Yes Inactive Problems Resolved Problems Electronic Signature(s) Signed: 12/26/2022 3:54:15 PM By: Kalman Shan DO Entered By: Kalman Shan on 12/26/2022 14:50:15 -------------------------------------------------------------------------------- Progress Note Details Patient Name: Date of Service: Destiny Dark F. 12/26/2022 2:00 PM Medical Record Number: [id number] Patient Account Number: [id number] Date of Birth/Sex: Treating RN: Mar 24, 1946 (77 y.o. 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 After the final reexcision, the wound had an ACell  graft applied. She reports that she had good closure of skin over the site. Earlier this year, at the end of May, however, she was diagnosed with rectal cancer and is currently undergoing neoadjuvant therapy (chemotherapy and radiation) in anticipation of future surgery. She reports that about 2 weeks ago, she ZAYLIAH, BRENDEL ([id number]) [id number] Page 6 of 10 noted that her skin coverage over the heel was beginning to break down. Her oncologist felt that perhaps the Xeloda was contributing to wound failure and stopped this medication. She continues to receive neoadjuvant radiation therapy, however. She saw plastic surgery last week, and they referred her to the wound care center for further evaluation and management. She is a type II diabetic, well controlled, with the most recent A1c available to me being 5.9%. ABIs done 1 year ago were normal. On the patient's left heel, there is an irregular wound with some skin maceration. The fat layer is exposed and there is slough over the entire surface. Outside of the area of maceration, the skin is in good condition. No erythema, induration, or odor. No concern for infection.  05/26/2022: The wound is smaller today and is extremely clean, without any slough buildup. The periwound skin is in much better condition and is no longer macerated. She is scheduled to complete her radiation treatment on Wednesday. 06/03/2022: The wound continues to contract. There is just a little bit of eschar and callus accumulation. The surface is clean. She has completed all of her neoadjuvant therapy and is awaiting surgery in November. 06/10/2022: The wound is smaller by about a centimeter in all dimensions. She does have a little bit of eschar near the Achilles portion of the wound. The wound surface itself is robust and healthy-appearing. 06/24/2022: The wound continues to contract. There is just a small portion open in the center. She does have  some dry skin and periwound callus circumferentially. No concern for infection. 07/08/2022: The wound is down to just a pinhole. There is a little bit of eschar on the surface. No concern for infection. 07/22/2022: Her wound is healed. 08/08/2022: Unfortunately, it seems that when her wound was being dressed at her last visit, the nurse applying the dressing pulled some dead skin off and reopened to the wound. I am not sure why this was not brought to my attention, but the patient has been caring for the wound at home with her supplies from her previous admission. She has been wearing her heel off loader and using Hydrofera Blue. There is some eschar accumulation, but no erythema, induration, or other concern for infection. Her colorectal surgery is scheduled for next Wednesday. 09/03/2022: Ms. Squires returns after undergoing her colon surgery. She has been dressing the wound at home with [facility], but has not been wearing her heel cup. The wound is more open today with a lot of periwound callus, dry skin, and a light layer of slough. No overt signs of infection. 09/17/2022: The wound is little bit smaller today. There is some periwound tissue maceration. She says she has been wearing her heel cup more regularly. 10/01/2022: The wound looks a little smaller in terms of the open dimensions. There is more fresh epithelium present. The periwound skin is actually a bit dry and cracked with peeling skin. 10/16/2022: The periwound skin is extremely dry, thickened, and cracked. The wound is about the same size. There is slough on the wound surface. 10/30/2022: The periwound skin is dry but not nearly as thickened or cracked. The wound is a little bit smaller and more superficial. Minimal slough accumulation. She apparently was unable to get the endoform from the DME supplier so when she ran out of the leftovers from her clinic visit, she went back to using [facility] 11/13/2022: There is a lot of dry  skin around the wound. Otherwise the surface is clean. She has been approved for [employer]. 11/27/2022: The wound surface has robust granulation tissue and there is epithelium creeping around the perimeter. 12/11/2022: There is more epithelium filling in around the wound perimeter. 3/15; left calcaneus wounds that we have been placing TheraSkin on. 2 small wounds divided by epithelization. Patient History Information obtained from Patient. Social History Former smoker - 1991, Marital Status - Divorced, Alcohol Use - Never, Drug Use - No History, Caffeine Use - Moderate - Diet coke,coffee. Medical History Endocrine Patient has history of Type II Diabetes - Metformin pillsand Ozempic Musculoskeletal Patient has history of Osteoarthritis Hospitalization/Surgery History - Melanoma excision to left heel;ORIF Left ankle fracture;Marland Kitchen - rectal surgery. Medical A Surgical History Notes nd Eyes Macular Degeneration Ear/Nose/Mouth/Throat Tinnitus Oncologic 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  has had Chemotherapy (stopped) and Radiation -8 treatments left as of 05/19/22 Rectal cancer Psychiatric Hx Depression Objective Constitutional respirations regular, non-labored and within target range for patient.. Vitals Time Taken: 2:00 PM, Height: 63 in, Weight: 182 lbs, BMI: 32.2, Pulse: 80 bpm, Respiratory Rate: 16 breaths/min, Blood Pressure: 128/80 mmHg. KATELYNN, MCALPINE ([id number]) [id number] Page 7 of 10 Cardiovascular 2+ dorsalis pedis/posterior tibialis pulses. Psychiatric pleasant and cooperative. General Notes: 2 small open wounds advised by epithelization. Wound bed with granulation tissue and slough. Slight maceration to the periwounds. Integumentary (Hair, Skin) Wound #1 status is Open. Original cause of wound was Hematoma. The date acquired was: 05/05/2022. The wound has been in treatment 31 weeks. The wound is located on the Left Calcaneus. The wound measures 2.4cm length x  0.9cm width x 0.1cm depth; 1.696cm^2 area and 0.17cm^3 volume. There is Fat Layer (Subcutaneous Tissue) exposed. There is no tunneling or undermining noted. There is a medium amount of serosanguineous drainage noted. The wound margin is flat and intact. There is large (67-100%) red granulation within the wound bed. There is a small (1-33%) amount of necrotic tissue within the wound bed. The periwound skin appearance had no abnormalities noted for moisture. The periwound skin appearance had no abnormalities noted for color. The periwound skin appearance exhibited: Callus. Periwound temperature was noted as No Abnormality. Assessment Active Problems ICD-10 Non-pressure chronic ulcer of left heel and midfoot with fat layer exposed Disruption of external operation (surgical) wound, not elsewhere classified, sequela Malignant melanoma of skin, unspecified Malignant neoplasm of rectum Type 2 diabetes mellitus without complications Personal history of antineoplastic chemotherapy Personal history of irradiation Patient's wound appears well-healing. I debrided nonviable tissue. TheraSkin was placed in standard fashion. Because she is having increased maceration I recommended she use slightly less hydrogel when changing the outter dressing. Procedures Wound #1 Pre-procedure diagnosis of Wound #1 is a Diabetic Wound/Ulcer of the Lower Extremity located on the Left Calcaneus .Severity of Tissue Pre Debridement is: Fat layer exposed. There was a Selective/Open Wound Skin/Epidermis Debridement with a total area of 2.16 sq cm performed by Kalman Shan, DO. With the following instrument(s): Curette to remove Non-Viable tissue/material. Material removed includes Slough and Skin: Epidermis and after achieving pain control using Lidocaine 4% T opical Solution. No specimens were taken. A time out was conducted at 14:19, prior to the start of the procedure. A Minimum amount of bleeding was controlled with  Silver Nitrate. The procedure was tolerated well. Post Debridement Measurements: 2.4cm length x 0.9cm width x 0.1cm depth; 0.17cm^3 volume. Character of Wound/Ulcer Post Debridement requires further debridement. Severity of Tissue Post Debridement is: Fat layer exposed. Post procedure Diagnosis Wound #1: Same as Pre-Procedure General Notes: Scribed for Dr. Celine Ahr by Blanche East, RN. Pre-procedure diagnosis of Wound #1 is a Diabetic Wound/Ulcer of the Lower Extremity located on the Left Calcaneus. A skin graft procedure using a bioengineered skin substitute/cellular or tissue based product was performed by Kalman Shan, DO with the following instrument(s): Forceps and Scissors. Theraskin was applied and secured with Steri-Strips. 4 sq cm of product was utilized and 2 sq cm was wasted due to size of wound. Post Application, Adaptec was applied. A Time Out was conducted at 14:22, prior to the start of the procedure. The procedure was tolerated well. Post procedure Diagnosis Wound #1: Same as Pre-Procedure General Notes: Scribed for Dr. Celine Ahr by Blanche East, RN. Plan Follow-up Appointments: Return Appointment in 2 weeks. - Dr. Celine Ahr RM 1 Anesthetic: Wound #1 Left Calcaneus: (  In clinic) Topical Lidocaine 4% applied to wound bed Cellular or Tissue Based Products: Wound #1 Left Calcaneus: Cellular or Tissue Based Product Type: - Theraskin #4 Cellular or Tissue Based Product applied to wound bed, secured with steri-strips, cover with Adaptic or Mepitel. (DO NOT REMOVE). - may change outer dressing only. apply a thin layer of hydrogel over adaptic with dressing changes Bathing/ Shower/ Hygiene: May shower with protection but do not get wound dressing(s) wet. Protect dressing(s) with water repellant cover (for example, large plastic bag) or a cast cover and may then take shower. Edema Control - Lymphedema / SCD / Other: Avoid standing for long periods of time. Moisturize legs  daily. Off-Loading: Other: - Please wear off-loading shoe on left foot to keep pressure off the heel TEKAYLA, WICKSON ([id number]) [phone].pdf Page 8 of 10 WOUND #1: - Calcaneus Wound Laterality: Left Cleanser: Normal Saline (Generic) 1 x Per Week/30 Days Discharge Instructions: Cleanse the wound with Normal Saline prior to applying a clean dressing using gauze sponges, not tissue or cotton balls. Cleanser: Soap and Water 1 x Per Week/30 Days Discharge Instructions: May shower and wash wound with dial antibacterial soap and water prior to dressing change. Cleanser: Wound Cleanser 1 x Per Week/30 Days Discharge Instructions: Cleanse the wound with wound cleanser prior to applying a clean dressing using gauze sponges, not tissue or cotton balls. Prim Dressing: TheraSkin 1 x Per Week/30 Days ary Secondary Dressing: ADAPTIC TOUCH 3x4.25 in 1 x Per Week/30 Days Discharge Instructions: Apply over primary dressing as directed. Secondary Dressing: Woven Gauze Sponge, Non-Sterile 4x4 in (Generic) 1 x Per Week/30 Days Discharge Instructions: Apply over primary dressing as directed. Secured With: [employer], 4.5x3.1 (in/yd) (Generic) 1 x Per Week/30 Days Discharge Instructions: Secure with Kerlix as directed. Secured With: 54M Medipore Public affairs consultant Surgical T 2x10 (in/yd) (Generic) 1 x Per Week/30 Days ape Discharge Instructions: Secure with tape as directed. 1. In office sharp debridement 2. TheraSkin placed in standard fashion 3. Follow-up in 2 weeks Electronic Signature(s) Signed: 01/05/2023 4:40:03 PM By: Deon Pilling RN, BSN Signed: 01/06/2023 10:35:32 AM By: Kalman Shan DO Previous Signature: 12/26/2022 3:54:15 PM Version By: Kalman Shan DO Entered By: Deon Pilling on 01/05/2023 14:53:25 -------------------------------------------------------------------------------- HxROS Details Patient Name: Date of Service: Destiny Dark F. 12/26/2022 2:00  PM Medical Record Number: [id number] Patient Account Number: [id number] Date of Birth/Sex: Treating RN: February 01, 1946 (78 y.o. F) Primary Care Provider: Deland Pretty Other Clinician: Referring Provider: Treating Provider/Extender: Judie Grieve in Treatment: 31 Information Obtained From Patient Eyes Medical History: Past Medical History Notes: Macular Degeneration Ear/Nose/Mouth/Throat Medical History: Past Medical History Notes: Tinnitus Endocrine Medical History: Positive for: Type II Diabetes - Metformin pillsand Ozempic Time with diabetes: 25 years Treated with: Oral agents Blood sugar tested every day: Yes Tested : every day Musculoskeletal Medical History: Positive for: Osteoarthritis Oncologic Medical History: Past Medical History Notes: 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 has had Chemotherapy (stopped) and Radiation -8 treatments left as of 05/19/22 Rectal cancer Psychiatric Medical History: KAREEMAH, GORGAS ([id number]) [phone].pdf Page 9 of 10 Past Medical History Notes: Hx Depression Immunizations Pneumococcal Vaccine: Received Pneumococcal Vaccination: Yes Received Pneumococcal Vaccination On or After 60th Birthday: Yes Implantable Devices None Hospitalization / Surgery History Type of Hospitalization/Surgery Melanoma excision to left heel;ORIF Left ankle fracture; rectal surgery Family and Social History Former smoker - 1991; Marital Status - Divorced; Alcohol Use: Never; Drug Use: No History; Caffeine Use: Moderate - Diet coke,coffee; Financial Concerns: No; Food,  Clothing or Shelter Needs: No; Support System Lacking: No; Transportation Concerns: No Electronic Signature(s) Signed: 12/26/2022 3:54:15 PM By: Kalman Shan DO Entered By: Kalman Shan on 12/26/2022 14:51:49 -------------------------------------------------------------------------------- SuperBill Details Patient Name: Date of Service: [name] 12/26/2022 Medical Record Number: [id number] Patient Account Number: [id number] Date of Birth/Sex: Treating RN: 09-26-1946 (77 y.o. F) Primary Care Provider: Deland Pretty Other Clinician: Referring Provider: Treating Provider/Extender: Judie Grieve in Treatment: 31 Diagnosis Coding ICD-10 Codes Code Description [phone] Non-pressure chronic ulcer of left heel and midfoot with fat layer exposed T81.31XS Disruption of external operation (surgical) wound, not elsewhere classified, sequela C43.9 Malignant melanoma of skin, unspecified C20 Malignant neoplasm of rectum E11.9 Type 2 diabetes mellitus without complications [id number] Personal history of antineoplastic chemotherapy Z92.3 Personal history of irradiation Facility Procedures : CPT4 Code: [id number] Description: [id number] Theraskin per 1sq cm extra small -6 sq cm ICD-10 Diagnosis Description L97.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 E11.9 Type 2 diabetes mellitus without complications Modifier: Quantity: 6 : CPT4 Code: [id number] Description: [id number] - SKIN SUB GRAFT FACE/NK/HF/G ICD-10 Diagnosis Description L97.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 E11.9 Type 2 diabetes mellitus without complications Modifier: Quantity: 1 Physician Procedures : CPT4 Code Description Modifier [id number] - WC PHYS SKIN SUB GRAFT FACE/NK/HF/G ICD-10 Diagnosis Description L97.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

## 2023-01-08 ENCOUNTER — Encounter (HOSPITAL_BASED_OUTPATIENT_CLINIC_OR_DEPARTMENT_OTHER): Payer: PPO | Admitting: General Surgery

## 2023-01-08 DIAGNOSIS — I10 Essential (primary) hypertension: Secondary | ICD-10-CM | POA: Diagnosis not present

## 2023-01-08 DIAGNOSIS — L97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: Secondary | ICD-10-CM | POA: Diagnosis not present

## 2023-01-08 DIAGNOSIS — E11621 Type 2 diabetes mellitus with foot ulcer: Secondary | ICD-10-CM | POA: Diagnosis not present

## 2023-01-09 NOTE — Progress Notes (Signed)
[name], [name] ([id number]) [id number] Page 1 of 6 Visit Report for 01/08/2023 Arrival Information Details Patient Name: Date of Service: Destiny Destiny [name], Destiny Destiny [name] 01/08/2023 2:00 PM Medical Record Number: [id number] Patient Account Number: [id number] Date of Birth/Sex: Treating RN: 05-18-1946 (77 y.o. Destiny Destiny [name], Destiny Destiny [name] Primary Care Destiny Destiny [name]: Destiny Destiny [name] Other Clinician: Referring Destiny Destiny [name]: Treating Destiny Destiny [name]/Extender: Destiny Destiny [name]: 33 Visit Information History Since Last Visit Added or deleted any medications: No Patient Arrived: Ambulatory Any new allergies or adverse reactions: No Arrival Time: 14:00 Had a fall or experienced change in No Accompanied By: self activities of daily living that may affect Transfer Assistance: None risk of falls: Patient Identification Verified: Yes Signs or symptoms of abuse/neglect since last visito No Secondary Verification Process Completed: Yes Hospitalized since last visit: No Patient Requires Transmission-Based Precautions: No Implantable device outside of the clinic excluding No Patient Has Alerts: No cellular tissue based products placed in the center since last visit: Has Dressing in Place as Prescribed: Yes Pain Present Now: No Electronic Signature(s) Signed: 01/08/2023 4:40:02 PM By: Destiny Destiny [name] Entered By: Destiny Destiny [name] on 01/08/2023 14:03:00 -------------------------------------------------------------------------------- Encounter Discharge Information Details Patient Name: Date of Service: Destiny Dark Destiny [name]. 01/08/2023 2:00 PM Medical Record Number: [id number] Patient Account Number: [id number] Date of Birth/Sex: Treating RN: [birth year] (77 y.o. Destiny Destiny [name] Primary Care Destiny Destiny [name]: Destiny Destiny [name] Other Clinician: Referring Destiny Destiny [name]: Treating Makalya Nave/Extender: Destiny Destiny [name]: 33 Encounter Discharge Information  Items Post Procedure Vitals Discharge Condition: Stable Temperature (Destiny [name]): 98.7 Ambulatory Status: Ambulatory Pulse (bpm): 85 Discharge Destination: Home Respiratory Rate (breaths/min): 16 Transportation: Private Auto Blood Pressure (mmHg): 137/78 Accompanied By: self Schedule Follow-up Appointment: Yes Clinical Summary of Care: Patient Declined Electronic Signature(s) Signed: 01/08/2023 4:40:02 PM By: Destiny Destiny [name] Entered By: Destiny Destiny [name] on 01/08/2023 16:01:04 -------------------------------------------------------------------------------- Lower Extremity Assessment Details Patient Name: Date of Service: Destiny Destiny [name], Destiny Destiny [name] 01/08/2023 2:00 PM Medical Record Number: [id number] Patient Account Number: [id number] Date of Birth/Sex: Treating RN: August 24, 1946 (77 y.o. Destiny Destiny [name] Primary Care Destiny Destiny [name]: Destiny Destiny [name] Other Clinician: Referring Destiny Destiny [name]: Treating Destiny Destiny [name]/Extender: Destiny Destiny [name]: 33 Edema Assessment Assessed: Destiny Destiny [name]: No] Patrice Paradise: No] P[LeftMARA, BHULLAR [id number] [[id number].pdf Page 2 of 6] Edema: [Left: N] [Right: o] Calf Left: Right: Point of Measurement: 29 cm From Medial Instep 35 cm Ankle Left: Right: Point of Measurement: 10 cm From Medial Instep 21 cm Electronic Signature(s) Signed: 01/08/2023 4:40:02 PM By: Destiny Destiny [name] Entered By: Destiny Destiny [name] on 01/08/2023 14:03:40 -------------------------------------------------------------------------------- Multi Wound Chart Details Patient Name: Date of Service: Destiny Dark Destiny [name]. 01/08/2023 2:00 PM Medical Record Number: [id number] Patient Account Number: [id number] Date of Birth/Sex: Treating RN: April 18, 1946 (77 y.o. Destiny [name]) Primary Care Tavyn Kurka: Destiny Destiny [name] Other Clinician: Referring Destiny Destiny [name]: Treating Cressie Betzler/Extender: Destiny Destiny [name]: 33 Vital Signs Height(in):  63 Pulse(bpm): 85 Weight(lbs): 182 Blood Pressure(mmHg): 137/78 Body Mass Index(BMI): 32.2 Temperature(Destiny [name]): 98.7 Respiratory Rate(breaths/min): 16 [1:Photos:] [N/A:N/A] Left Calcaneus N/A N/A Wound Location: Hematoma N/A N/A Wounding Event: Diabetic Wound/Ulcer of the Lower N/A N/A Primary Etiology: Extremity Type II Diabetes, Osteoarthritis N/A N/A Comorbid History: 05/05/2022 N/A N/A Date Acquired: 35 N/A N/A Weeks of Destiny [name]: Open N/A N/A Wound Status: No N/A N/A Wound Recurrence: 1.4x0.9x0.1 N/A N/A Measurements L x W x D (cm) 0.99 N/A N/A A (cm) : rea 0.099 N/A N/A Volume (cm) : 85.50% N/A N/A % Reduction in A rea: 92.70% N/A N/A % Reduction in Volume: Grade 1 N/A N/A Classification:  Medium N/A N/A Exudate A mount: Serosanguineous N/A N/A Exudate Type: red, brown N/A N/A Exudate Color: Flat and Intact N/A N/A Wound Margin: Large (67-100%) N/A N/A Granulation A mount: Red N/A N/A Granulation Quality: Small (1-33%) N/A N/A Necrotic A mount: Eschar, Adherent Slough N/A N/A Necrotic Tissue: Fat Layer (Subcutaneous Tissue): Yes N/A N/A Exposed Structures: Fascia: No Tendon: No Muscle: No Joint: No Bone: No Small (1-33%) N/A N/A Epithelialization: Debridement - Selective/Open Wound N/A N/A Debridement: Pre-procedure Verification/Time Out 14:33 N/A N/A TakenANAY, RATTS ([id number] Page 3 of 6 Lidocaine 5% topical ointment N/A N/A Pain Control: Necrotic/Eschar, Callus N/A N/A Tissue Debrided: Non-Viable Tissue N/A N/A Level: 1.26 N/A N/A Debridement A (sq cm): rea Curette N/A N/A Instrument: Minimum N/A N/A Bleeding: Pressure N/A N/A Hemostasis A chieved: Procedure was tolerated well N/A N/A Debridement Destiny [name] Response: 1.4x0.9x0.1 N/A N/A Post Debridement Measurements L x W x D (cm) 0.099 N/A N/A Post Debridement Volume: (cm) Callus: Yes N/A N/A Periwound Skin  Texture: Dry/Scaly: Yes N/A N/A Periwound Skin Moisture: Maceration: No No Abnormalities Noted N/A N/A Periwound Skin Color: No Abnormality N/A N/A Temperature: Cellular or Tissue Based Product N/A N/A Procedures Performed: Debridement Destiny [name] Notes Electronic Signature(s) Signed: 01/08/2023 2:48:11 PM By: Fredirick Maudlin MD FACS Entered By: Fredirick Maudlin on 01/08/2023 14:48:11 -------------------------------------------------------------------------------- Multi-Disciplinary Care Plan Details Patient Name: Date of Service: Destiny Dark Destiny [name]. 01/08/2023 2:00 PM Medical Record Number: [id number] Patient Account Number: [id number] Date of Birth/Sex: Treating RN: 1946/04/16 (77 y.o. Destiny Destiny [name] Primary Care Cayden Granholm: Destiny Destiny [name] Other Clinician: Referring Pharaoh Pio: Treating Kristofer Schaffert/Extender: Destiny Destiny [name]: [place] reviewed with physician Active Inactive Necrotic Tissue Nursing Diagnoses: Impaired tissue integrity related to necrotic/devitalized tissue Knowledge deficit related to management of necrotic/devitalized tissue Goals: Necrotic/devitalized tissue will be minimized in the wound bed Date Initiated: 08/08/2022 Target Resolution Date: 02/13/2023 Goal Status: Active Patient/caregiver will verbalize understanding of reason and process for debridement of necrotic tissue Date Initiated: 08/08/2022 Target Resolution Date: 02/13/2023 Goal Status: Active Interventions: Assess patient pain level pre-, during and post procedure and prior to discharge Provide education on necrotic tissue and debridement process Destiny [name] Activities: Apply topical anesthetic as ordered : 08/08/2022 Notes: Electronic Signature(s) Signed: 01/08/2023 4:40:02 PM By: Destiny Destiny [name] Entered By: Destiny Destiny [name] on 01/08/2023 14:26:06 -------------------------------------------------------------------------------- Pain  Assessment Details Patient Name: Date of Service: Destiny Dark Destiny [name]. 01/08/2023 2:00 PM Destiny Destiny [name] ([id number] Page 4 of 6 Medical Record Number: [id number] Patient Account Number: [id number] Date of Birth/Sex: Treating RN: Apr 28, 1946 (77 y.o. Destiny Destiny [name] Primary Care Yuriy Cui: Destiny Destiny [name] Other Clinician: Referring Corinne Goucher: Treating Djibril Glogowski/Extender: Destiny Destiny [name]: 33 Active Problems Location of Pain Severity and Description of Pain Patient Has Paino No Site Locations Rate the pain. Current Pain Level: 0 Pain Management and Medication Current Pain Management: Electronic Signature(s) Signed: 01/08/2023 4:40:02 PM By: Destiny Destiny [name] Entered By: Destiny Destiny [name] on 01/08/2023 14:03:36 -------------------------------------------------------------------------------- Patient/Caregiver Education Details Patient Name: Date of Service: Destiny Destiny [name], Destiny TTIE Destiny [name]. 3/28/2024andnbsp2:00 PM Medical Record Number: [id number] Patient Account Number: [id number] Date of Birth/Gender: Treating RN: 10/16/1945 (77 y.o. Destiny Destiny [name] Primary Care Physician: Destiny Destiny [name] Other Clinician: Referring Physician: Treating Physician/Extender: Destiny Destiny [name]: 55 Education Assessment Education Provided To: Patient Education Topics Provided Wound/Skin Impairment: Methods: Explain/Verbal Responses: Reinforcements needed, State content correctly Electronic Signature(s) Signed: 01/08/2023 4:40:02 PM By: Destiny Destiny [name] Entered By: Destiny Destiny [name] on 01/08/2023 14:26:17 -------------------------------------------------------------------------------- Wound Assessment  Details Patient Name: Date of Service: Destiny Destiny [name], Destiny Destiny [name] 01/08/2023 2:00 PM Medical Record Number: [id number] Patient Account Number: [id number] Date of Birth/Sex: Treating RN: 12-Apr-1946 (77 y.o. [address], Destiny Destiny [name], Destiny Destiny [name] ([id number]) [id number] Page 5 of 6 Primary Care Dayden Viverette: Destiny Destiny [name] Other Clinician: Referring Dorethia Jeanmarie: Treating Glendia Olshefski/Extender: Destiny Destiny [name]: 33 Wound Status Wound Number: 1 Primary Etiology: Diabetic Wound/Ulcer of the Lower Extremity Wound Location: Left Calcaneus Wound Status: Open Wounding Event: Hematoma Comorbid History: Type II Diabetes, Osteoarthritis Date Acquired: 05/05/2022 Weeks Of Destiny [name]: 33 Clustered Wound: No Photos Wound Measurements Length: (cm) 1.4 Width: (cm) 0.9 Depth: (cm) 0.1 Area: (cm) 0.99 Volume: (cm) 0.099 % Reduction in Area: 85.5% % Reduction in Volume: 92.7% Epithelialization: Small (1-33%) Tunneling: No Undermining: No Wound Description Classification: Grade 1 Wound Margin: Flat and Intact Exudate Amount: Medium Exudate Type: Serosanguineous Exudate Color: red, brown Foul Odor After Cleansing: No Slough/Fibrino Yes Wound Bed Granulation Amount: Large (67-100%) Exposed Structure Granulation Quality: Red Fascia Exposed: No Necrotic Amount: Small (1-33%) Fat Layer (Subcutaneous Tissue) Exposed: Yes Necrotic Quality: Eschar, Adherent Slough Tendon Exposed: No Muscle Exposed: No Joint Exposed: No Bone Exposed: No Periwound Skin Texture Texture Color No Abnormalities Noted: No No Abnormalities Noted: Yes Callus: Yes Temperature / Pain Temperature: No Abnormality Moisture No Abnormalities Noted: Yes Destiny [name] Notes Wound #1 (Calcaneus) Wound Laterality: Left Cleanser Normal Saline Discharge Instruction: Cleanse the wound with Normal Saline prior to applying a clean dressing using gauze sponges, not tissue or cotton balls. Soap and Water Discharge Instruction: May shower and wash wound with dial antibacterial soap and water prior to dressing change. Wound Cleanser Discharge Instruction: Cleanse the wound with wound  cleanser prior to applying a clean dressing using gauze sponges, not tissue or cotton balls. Peri-Wound Care Topical Primary Dressing Destiny Destiny [name], Destiny Destiny [name] ([id number]) [id number] Page 6 of 6 TheraSkin Secondary Dressing ADAPTIC TOUCH 3x4.25 in Discharge Instruction: Apply over primary dressing as directed. Woven Gauze Sponge, Non-Sterile 4x4 in Discharge Instruction: Apply over primary dressing as directed. Secured With [employer], 4.5x3.1 (in/yd) Discharge Instruction: Secure with Kerlix as directed. 59M Medipore Soft Cloth Surgical T 2x10 (in/yd) ape Discharge Instruction: Secure with tape as directed. Compression Wrap Compression Stockings Add-Ons Electronic Signature(s) Signed: 01/08/2023 4:40:02 PM By: Destiny Destiny [name] Entered By: Destiny Destiny [name] on 01/08/2023 14:11:16 -------------------------------------------------------------------------------- Vitals Details Patient Name: Date of Service: Destiny Dark Destiny [name]. 01/08/2023 2:00 PM Medical Record Number: [id number] Patient Account Number: [id number] Date of Birth/Sex: Treating RN: May 27, 1946 (77 y.o. Destiny Destiny [name] Primary Care Jatavion Peaster: Destiny Destiny [name] Other Clinician: Referring Famous Eisenhardt: Treating Richards Pherigo/Extender: Destiny Destiny [name]: 33 Vital Signs Time Taken: 14:03 Temperature (Destiny [name]): 98.7 Height (in): 63 Pulse (bpm): 85 Weight (lbs): 182 Respiratory Rate (breaths/min): 16 Body Mass Index (BMI): 32.2 Blood Pressure (mmHg): 137/78 Reference Range: 80 - 120 mg / dl Electronic Signature(s) Signed: 01/08/2023 4:40:02 PM By: Destiny Destiny [name] Entered By: Destiny Destiny [name] on 01/08/2023 14:03:30

## 2023-01-09 NOTE — Progress Notes (Signed)
Destiny [name] ([id number]) [id number] Page 1 of 10 Visit Report for 01/08/2023 Chief Complaint Document Details Patient Name: Date of Service: Destiny [name], Destiny [name] 01/08/2023 2:00 PM Medical Record Number: [id number] Patient Account Number: [id number] Date of Birth/Sex: Treating RN: 1946/04/15 (77 y.o. F) Primary Care Provider: Deland [name] Other Clinician: Referring Provider: Treating Provider/Extender: Destiny [name] in Treatment: 33 Information Obtained from: Patient Chief Complaint Patient presents to the wound care center with open non-healing surgical wound(s) Electronic Signature(s) Signed: 01/08/2023 2:48:18 PM By: Destiny Maudlin MD FACS Entered By: Destiny [name] on 01/08/2023 14:48:18 -------------------------------------------------------------------------------- Cellular or Tissue Based Product Details Patient Name: Date of Service: Destiny [name], Destiny [name] 01/08/2023 2:00 PM Medical Record Number: [id number] Patient Account Number: [id number] Date of Birth/Sex: Treating RN: 12/20/1945 (77 y.o. Destiny [name] Primary Care Provider: Deland [name] Other Clinician: Referring Provider: Treating Provider/Extender: Destiny [name] in Treatment: 33 Cellular or Tissue Based Product Type Wound #1 Left Calcaneus Applied to: Performed By: Physician Destiny Maudlin, MD Cellular or Tissue Based Product Type: Theraskin Level of Consciousness (Pre-procedure): Awake and Alert Pre-procedure Verification/Time Out Yes - 14:36 Taken: Location: genitalia / hands / feet / multiple digits Wound Size (sq cm): 1.26 Product Size (sq cm): 6.5 Waste Size (sq cm): 0 Amount of Product Applied (sq cm): 6.5 Instrument Used: Forceps, Scissors Lot #: [id number] Expiration Date: 04/23/2027 Fenestrated: No Reconstituted: Yes Solution Type: normal saline Solution Amount: 6 ml Lot #: [id number] Solution Expiration Date:  03/12/2025 Secured: Yes Secured With: Steri-Strips Dressing Applied: Yes Primary Dressing: adaptic Procedural Pain: 0 Post Procedural Pain: 0 Response to Treatment: Procedure was tolerated well Level of Consciousness (Post- Awake and Alert procedure): Post Procedure Diagnosis Same as Pre-procedure Notes scribed for Dr. Celine [name] by Destiny Peals, RN Electronic Signature(s) Destiny [name] ([id number]) [id number] Page 2 of 10 Signed: 01/08/2023 2:58:03 PM By: Destiny Maudlin MD FACS Signed: 01/08/2023 4:40:02 PM By: Destiny [name] By: Destiny [name] on 01/08/2023 14:36:41 -------------------------------------------------------------------------------- Debridement Details Patient Name: Date of Service: Destiny Dark F. 01/08/2023 2:00 PM Medical Record Number: [id number] Patient Account Number: [id number] Date of Birth/Sex: Treating RN: 06/07/1946 (77 y.o. Destiny [name] Primary Care Provider: Deland [name] Other Clinician: Referring Provider: Treating Provider/Extender: Destiny [name] in Treatment: 33 Debridement Performed for Assessment: Wound #1 Left Calcaneus Performed By: Physician Destiny Maudlin, MD Debridement Type: Debridement Severity of Tissue Pre Debridement: Fat layer exposed Level of Consciousness (Pre-procedure): Awake and Alert Pre-procedure Verification/Time Out Yes - 14:33 Taken: Start Time: 14:33 Pain Control: Lidocaine 5% topical ointment T Area Debrided (L x W): otal 1.4 (cm) x 0.9 (cm) = 1.26 (cm) Tissue and other material debrided: Non-Viable, Callus, Eschar Level: Non-Viable Tissue Debridement Description: Selective/Open Wound Instrument: Curette Bleeding: Minimum Hemostasis Achieved: Pressure Response to Treatment: Procedure was tolerated well Level of Consciousness (Post- Awake and Alert procedure): Post Debridement Measurements of Total Wound Length: (cm)  1.4 Width: (cm) 0.9 Depth: (cm) 0.1 Volume: (cm) 0.099 Character of Wound/Ulcer Post Debridement: Improved Severity of Tissue Post Debridement: Fat layer exposed Post Procedure Diagnosis Same as Pre-procedure Notes scribed for Dr. Celine [name] by Destiny Peals, RN Electronic Signature(s) Signed: 01/08/2023 2:58:03 PM By: Destiny Maudlin MD FACS Signed: 01/08/2023 4:40:02 PM By: Destiny [name] Entered By: Destiny [name] on 01/08/2023 14:35:56 -------------------------------------------------------------------------------- HPI Details Patient Name: Date of Service: Destiny Dark F. 01/08/2023 2:00 PM Medical Record Number: [id number] Patient Account Number: [id number] Date of Birth/Sex: Treating RN: [birth year] (77 y.o. 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 After the final reexcision, the wound had an ACell graft applied. She reports that she had good closure of skin over the site. Earlier this year, at the end of May, however, she was diagnosed with rectal cancer and is currently undergoing neoadjuvant therapy (chemotherapy and radiation) in anticipation of future surgery. She reports that about 2 weeks ago, she noted that her skin coverage over the heel was beginning to break down. Her oncologist felt that perhaps the Xeloda was contributing to wound failure and Destiny [name], Destiny [name] ([id number]) [id number] Page 3 of 10 stopped this medication. She continues to receive neoadjuvant radiation therapy, however. She saw plastic surgery last week, and they referred her to the wound care center for further evaluation and management. She is a type II diabetic, well  controlled, with the most recent A1c available to me being 5.9%. ABIs done 1 year ago were normal. On the patient's left heel, there is an irregular wound with some skin maceration. The fat layer is exposed and there is slough over the entire surface. Outside of the area of maceration, the skin is in good condition. No erythema, induration, or odor. No concern for infection. 05/26/2022: The wound is smaller today and is extremely clean, without any slough buildup. The periwound skin is in much better condition and is no longer macerated. She is scheduled to complete her radiation treatment on Wednesday. 06/03/2022: The wound continues to contract. There is just a little bit of eschar and callus accumulation. The surface is clean. She has completed all of her neoadjuvant therapy and is awaiting surgery in November. 06/10/2022: The wound is smaller by about a centimeter in all dimensions. She does have a little bit of eschar near the Achilles portion of the wound. The wound surface itself is robust and healthy-appearing. 06/24/2022: The wound continues to contract. There is just a small portion open in the center. She does have some dry skin and periwound callus circumferentially. No concern for infection. 07/08/2022: The wound is down to just a pinhole. There is a little bit of eschar on the surface. No concern for infection. 07/22/2022: Her wound is healed. 08/08/2022: Unfortunately, it seems that when her wound was being dressed at her last visit, the nurse applying the dressing pulled some dead skin off and reopened to the wound. I am not sure why this was not brought to my attention, but the patient has been caring for the wound at home with her supplies from her previous admission. She has been wearing her heel off loader and using Hydrofera Blue. There is some eschar accumulation, but no erythema, induration, or other concern for infection. Her colorectal surgery is scheduled for next  Wednesday. 09/03/2022: Ms. Deeken returns after undergoing her colon surgery. She has been dressing the wound at home with [facility], but has not been wearing her heel cup. The wound is more open today with a lot of periwound callus, dry skin, and a light layer of slough. No overt signs of infection. 09/17/2022: The wound is little bit smaller today. There is some periwound tissue maceration. She says she has been wearing her heel cup more regularly. 10/01/2022: The wound looks a little smaller in terms of the open dimensions. There is more fresh epithelium present.  The periwound skin is actually a bit dry and cracked with peeling skin. 10/16/2022: The periwound skin is extremely dry, thickened, and cracked. The wound is about the same size. There is slough on the wound surface. 10/30/2022: The periwound skin is dry but not nearly as thickened or cracked. The wound is a little bit smaller and more superficial. Minimal slough accumulation. She apparently was unable to get the endoform from the DME supplier so when she ran out of the leftovers from her clinic visit, she went back to using [facility]. 11/13/2022: There is a lot of dry skin around the wound. Otherwise the surface is clean. She has been approved for [employer]. 11/27/2022: The wound surface has robust granulation tissue and there is epithelium creeping around the perimeter. 12/11/2022: There is more epithelium filling in around the wound perimeter. 3/15; left calcaneus wounds that we have been placing TheraSkin on. 2 small wounds divided by epithelization. 01/08/2023: There has been more epithelialization of the wound. There is some dry eschar on the surface of the larger more distal aspect and around the edges. Electronic Signature(s) Signed: 01/08/2023 2:48:49 PM By: Destiny Maudlin MD FACS Entered By: Destiny [name] on 01/08/2023 14:48:49 -------------------------------------------------------------------------------- Physical  Exam Details Patient Name: Date of Service: Destiny Dark F. 01/08/2023 2:00 PM Medical Record Number: [id number] Patient Account Number: [id number] Date of Birth/Sex: Treating RN: May 27, 1946 (77 y.o. F) Primary Care Provider: Deland [name] Other Clinician: Referring Provider: Treating Provider/Extender: Destiny [name] in Treatment: 34 Constitutional . . . . no acute distress. Respiratory Normal work of breathing on room air. Notes 01/08/2023: There has been more epithelialization of the wound. There is some dry eschar on the surface of the larger more distal aspect and around the edges. Electronic Signature(s) Signed: 01/08/2023 2:49:22 PM By: Destiny Maudlin MD FACS Entered By: Destiny [name] on 01/08/2023 14:49:22 Regal, Lurene Shadow ([id number]) [id number] Page 4 of 10 -------------------------------------------------------------------------------- Physician Orders Details Patient Name: Date of Service: LORANN, ZENDER 01/08/2023 2:00 PM Medical Record Number: [id number] Patient Account Number: [id number] Date of Birth/Sex: Treating RN: [birth year] (77 y.o. Destiny [name] Primary Care Provider: Deland [name] Other Clinician: Referring Provider: Treating Provider/Extender: Destiny [name] in Treatment: 57 Verbal / Phone Orders: No Diagnosis Coding ICD-10 Coding Code Description [phone] Non-pressure chronic ulcer of left heel and midfoot with fat layer exposed T81.31XS Disruption of external operation (surgical) wound, not elsewhere classified, sequela C43.9 Malignant melanoma of skin, unspecified C20 Malignant neoplasm of rectum E11.9 Type 2 diabetes mellitus without complications [id number] Personal history of antineoplastic chemotherapy Z92.3 Personal history of irradiation Follow-up Appointments ppointment in 2 weeks. - Dr. Celine [name] RM 1 Return A Anesthetic Wound #1 Left Calcaneus (In clinic)  Topical Lidocaine 4% applied to wound bed Cellular or Tissue Based Products Wound #1 Left Calcaneus Cellular or Tissue Based Product Type: - Theraskin #5 daptic or Mepitel. (DO NOT REMOVE). - may Cellular or Tissue Based Product applied to wound bed, secured with steri-strips, cover with A change outer dressing only. apply a thin layer of hydrogel over adaptic with dressing changes Bathing/ Shower/ Hygiene May shower with protection but do not get wound dressing(s) wet. Protect dressing(s) with water repellant cover (for example, large plastic bag) or a cast cover and may then take shower. Edema Control - Lymphedema / SCD / Other Avoid standing for long periods of time. Moisturize legs daily. Off-Loading Other: - Please wear off-loading shoe on left foot to keep pressure off the heel Wound  Treatment Wound #1 - Calcaneus Wound Laterality: Left Cleanser: Normal Saline (Generic) 1 x Per Week/30 Days Discharge Instructions: Cleanse the wound with Normal Saline prior to applying a clean dressing using gauze sponges, not tissue or cotton balls. Cleanser: Soap and Water 1 x Per Week/30 Days Discharge Instructions: May shower and wash wound with dial antibacterial soap and water prior to dressing change. Cleanser: Wound Cleanser 1 x Per Week/30 Days Discharge Instructions: Cleanse the wound with wound cleanser prior to applying a clean dressing using gauze sponges, not tissue or cotton balls. Prim Dressing: TheraSkin ary 1 x Per Week/30 Days Secondary Dressing: ADAPTIC TOUCH 3x4.25 in 1 x Per Week/30 Days Discharge Instructions: Apply over primary dressing as directed. Secondary Dressing: Woven Gauze Sponge, Non-Sterile 4x4 in (Generic) 1 x Per Week/30 Days Discharge Instructions: Apply over primary dressing as directed. Secured With: [employer], 4.5x3.1 (in/yd) (Generic) 1 x Per Week/30 Days Discharge Instructions: Secure with Kerlix as directed. Secured With: 48M Medipore Public affairs consultant  Surgical T 2x10 (in/yd) (Generic) 1 x Per Week/30 Days ape Discharge Instructions: Secure with tape as directed. Destiny [name], Destiny [name] ([id number]) [id number] Page 5 of 10 Patient Medications llergies: propofol, aspirin, atorvastatin, Lisinopril, hydrocodone, penicillin A Notifications Medication Indication Start End 01/08/2023 lidocaine DOSE topical 5 % ointment - ointment topical Electronic Signature(s) Signed: 01/08/2023 2:58:03 PM By: Destiny Maudlin MD FACS Entered By: Destiny [name] on 01/08/2023 14:49:37 -------------------------------------------------------------------------------- Problem List Details Patient Name: Date of Service: Destiny Dark F. 01/08/2023 2:00 PM Medical Record Number: [id number] Patient Account Number: [id number] Date of Birth/Sex: Treating RN: 1946/01/30 (77 y.o. F) Primary Care Provider: Deland [name] Other Clinician: Referring Provider: Treating Provider/Extender: Destiny [name] in Treatment: 33 Active Problems ICD-10 Encounter Code Description Active Date MDM Diagnosis L97.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 05/19/2022 No Yes T81.31XS Disruption of external operation (surgical) wound, not elsewhere classified, 05/19/2022 No Yes sequela C43.9 Malignant melanoma of skin, unspecified 05/19/2022 No Yes C20 Malignant neoplasm of rectum 05/19/2022 No Yes E11.9 Type 2 diabetes mellitus without complications [id number] No Yes Z92.21 Personal history of antineoplastic chemotherapy 05/19/2022 No Yes Z92.3 Personal history of irradiation 05/19/2022 No Yes Inactive Problems Resolved Problems Electronic Signature(s) Signed: 01/08/2023 2:47:14 PM By: Destiny Maudlin MD FACS Entered By: Destiny [name] on 01/08/2023 14:47:14 Progress Note Details -------------------------------------------------------------------------------- Destiny [name] ([id number])  [id number] Page 6 of 10 Patient Name: Date of Service: Destiny [name], Destiny [name] 01/08/2023 2:00 PM Medical Record Number: [id number] Patient Account Number: [id number] Date of Birth/Sex: Treating RN: 1946/05/12 (77 y.o. 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 After the final reexcision, the wound had an ACell graft applied. She reports that she had good closure of skin over the site. Earlier this year, at the end of May, however, she was diagnosed with rectal cancer and is currently undergoing neoadjuvant therapy (chemotherapy and radiation) in anticipation of future surgery. She reports that about 2 weeks ago, she noted that her skin coverage over the heel was beginning to break down. Her oncologist felt that perhaps the Xeloda was contributing to wound failure and stopped this medication. She continues to receive neoadjuvant  radiation therapy, however. She saw plastic surgery last week, and they referred her to the wound care center for further evaluation and management. She is a type II diabetic, well controlled, with the most recent A1c available to me being 5.9%. ABIs done 1 year ago were normal. On the patient's left heel, there is an irregular wound with some skin maceration. The fat layer is exposed and there is slough over the entire surface. Outside of the area of maceration, the skin is in good condition. No erythema, induration, or odor. No concern for infection. 05/26/2022: The wound is smaller today and is extremely clean, without any  slough buildup. The periwound skin is in much better condition and is no longer macerated. She is scheduled to complete her radiation treatment on Wednesday. 06/03/2022: The wound continues to contract. There is just a little bit of eschar and callus accumulation. The surface is clean. She has completed all of her neoadjuvant therapy and is awaiting surgery in November. 06/10/2022: The wound is smaller by about a centimeter in all dimensions. She does have a little bit of eschar near the Achilles portion of the wound. The wound surface itself is robust and healthy-appearing. 06/24/2022: The wound continues to contract. There is just a small portion open in the center. She does have some dry skin and periwound callus circumferentially. No concern for infection. 07/08/2022: The wound is down to just a pinhole. There is a little bit of eschar on the surface. No concern for infection. 07/22/2022: Her wound is healed. 08/08/2022: Unfortunately, it seems that when her wound was being dressed at her last visit, the nurse applying the dressing pulled some dead skin off and reopened to the wound. I am not sure why this was not brought to my attention, but the patient has been caring for the wound at home with her supplies from her previous admission. She has been wearing her heel off loader and using Hydrofera Blue. There is some eschar accumulation, but no erythema, induration, or other concern for infection. Her colorectal surgery is scheduled for next Wednesday. 09/03/2022: Ms. Kary returns after undergoing her colon surgery. She has been dressing the wound at home with [facility], but has not been wearing her heel cup. The wound is more open today with a lot of periwound callus, dry skin, and a light layer of slough. No overt signs of infection. 09/17/2022: The wound is little bit smaller today. There is some periwound tissue maceration. She says she has been wearing her heel cup more  regularly. 10/01/2022: The wound looks a little smaller in terms of the open dimensions. There is more fresh epithelium present. The periwound skin is actually a bit dry and cracked with peeling skin. 10/16/2022: The periwound skin is extremely dry, thickened, and cracked. The wound is about the same size. There is slough on the wound surface. 10/30/2022: The periwound skin is dry but not nearly as thickened or cracked. The wound is a little bit smaller and more superficial. Minimal slough accumulation. She apparently was unable to get the endoform from the DME supplier so when she ran out of the leftovers from her clinic visit, she went back to using [facility]. 11/13/2022: There is a lot of dry skin around the wound. Otherwise the surface is clean. She has been approved for [employer]. 11/27/2022: The wound surface has robust granulation tissue and there is epithelium creeping around the perimeter. 12/11/2022: There is more epithelium filling in around the wound perimeter. 3/15; left calcaneus  wounds that we have been placing TheraSkin on. 2 small wounds divided by epithelization. 01/08/2023: There has been more epithelialization of the wound. There is some dry eschar on the surface of the larger more distal aspect and around the edges. Patient History Information obtained from Patient. Social History Former smoker - 1991, Marital Status - Divorced, Alcohol Use - Never, Drug Use - No History, Caffeine Use - Moderate - Diet coke,coffee. Medical History Endocrine Patient has history of Type II Diabetes - Metformin pillsand Ozempic Musculoskeletal Patient has history of Osteoarthritis Hospitalization/Surgery History - Melanoma excision to left heel;[facility] Left ankle fracture;[facility] - rectal surgery. Medical A Surgical History Notes nd Eyes Destiny [name], Destiny [name] ([id number]) [id number] Page 7 of 10 Macular Degeneration Ear/Nose/Mouth/Throat Tinnitus Oncologic Melanoma on left  foot has had Chemotherapy (stopped) and Radiation -8 treatments left as of 05/19/22 Rectal cancer Psychiatric Hx Depression Objective Constitutional no acute distress. Vitals Time Taken: 2:03 PM, Height: 63 in, Weight: 182 lbs, BMI: 32.2, Temperature: 98.7 F, Pulse: 85 bpm, Respiratory Rate: 16 breaths/min, Blood Pressure: 137/78 mmHg. Respiratory Normal work of breathing on room air. General Notes: 01/08/2023: There has been more epithelialization of the wound. There is some dry eschar on the surface of the larger more distal aspect and around the edges. Integumentary (Hair, Skin) Wound #1 status is Open. Original cause of wound was Hematoma. The date acquired was: 05/05/2022. The wound has been in treatment 33 weeks. The wound is located on the Left Calcaneus. The wound measures 1.4cm length x 0.9cm width x 0.1cm depth; 0.99cm^2 area and 0.099cm^3 volume. There is Fat Layer (Subcutaneous Tissue) exposed. There is no tunneling or undermining noted. There is a medium amount of serosanguineous drainage noted. The wound margin is flat and intact. There is large (67-100%) red granulation within the wound bed. There is a small (1-33%) amount of necrotic tissue within the wound bed including Eschar and Adherent Slough. The periwound skin appearance had no abnormalities noted for moisture. The periwound skin appearance had no abnormalities noted for color. The periwound skin appearance exhibited: Callus. Periwound temperature was noted as No Abnormality. Assessment Active Problems ICD-10 Non-pressure chronic ulcer of left heel and midfoot with fat layer exposed Disruption of external operation (surgical) wound, not elsewhere classified, sequela Malignant melanoma of skin, unspecified Malignant neoplasm of rectum Type 2 diabetes mellitus without complications Personal history of antineoplastic chemotherapy Personal history of irradiation Procedures Wound #1 Pre-procedure diagnosis of Wound #1  is a Diabetic Wound/Ulcer of the Lower Extremity located on the Left Calcaneus .Severity of Tissue Pre Debridement is: Fat layer exposed. There was a Selective/Open Wound Non-Viable Tissue Debridement with a total area of 1.26 sq cm performed by Destiny Maudlin, MD. With the following instrument(s): Curette to remove Non-Viable tissue/material. Material removed includes Eschar and Callus and after achieving pain control using Lidocaine 5% topical ointment. No specimens were taken. A time out was conducted at 14:33, prior to the start of the procedure. A Minimum amount of bleeding was controlled with Pressure. The procedure was tolerated well. Post Debridement Measurements: 1.4cm length x 0.9cm width x 0.1cm depth; 0.099cm^3 volume. Character of Wound/Ulcer Post Debridement is improved. Severity of Tissue Post Debridement is: Fat layer exposed. Post procedure Diagnosis Wound #1: Same as Pre-Procedure General Notes: scribed for Dr. Celine [name] by Destiny Peals, RN. Pre-procedure diagnosis of Wound #1 is a Diabetic Wound/Ulcer of the Lower Extremity located on the Left Calcaneus. A skin graft procedure using a bioengineered skin substitute/cellular or tissue based product was performed  by Destiny Maudlin, MD with the following instrument(s): Forceps and Scissors. Theraskin was applied and secured with Steri-Strips. 6.5 sq cm of product was utilized and 0 sq cm was wasted. Post Application, adaptic was applied. A Time Out was conducted at 14:36, prior to the start of the procedure. The procedure was tolerated well with a pain level of 0 throughout and a pain level of 0 following the procedure. Post procedure Diagnosis Wound #1: Same as Pre-Procedure General Notes: scribed for Dr. Celine [name] by Destiny Peals, RN. Destiny [name], Destiny [name] ([id number]) [id number] Page 8 of 10 Plan Follow-up Appointments: Return Appointment in 2 weeks. - Dr. Celine [name] RM 1 Anesthetic: Wound #1 Left  Calcaneus: (In clinic) Topical Lidocaine 4% applied to wound bed Cellular or Tissue Based Products: Wound #1 Left Calcaneus: Cellular or Tissue Based Product Type: - Theraskin #5 Cellular or Tissue Based Product applied to wound bed, secured with steri-strips, cover with Adaptic or Mepitel. (DO NOT REMOVE). - may change outer dressing only. apply a thin layer of hydrogel over adaptic with dressing changes Bathing/ Shower/ Hygiene: May shower with protection but do not get wound dressing(s) wet. Protect dressing(s) with water repellant cover (for example, large plastic bag) or a cast cover and may then take shower. Edema Control - Lymphedema / SCD / Other: Avoid standing for long periods of time. Moisturize legs daily. Off-Loading: Other: - Please wear off-loading shoe on left foot to keep pressure off the heel The following medication(s) was prescribed: lidocaine topical 5 % ointment ointment topical was prescribed at facility WOUND #1: - Calcaneus Wound Laterality: Left Cleanser: Normal Saline (Generic) 1 x Per Week/30 Days Discharge Instructions: Cleanse the wound with Normal Saline prior to applying a clean dressing using gauze sponges, not tissue or cotton balls. Cleanser: Soap and Water 1 x Per Week/30 Days Discharge Instructions: May shower and wash wound with dial antibacterial soap and water prior to dressing change. Cleanser: Wound Cleanser 1 x Per Week/30 Days Discharge Instructions: Cleanse the wound with wound cleanser prior to applying a clean dressing using gauze sponges, not tissue or cotton balls. Prim Dressing: TheraSkin 1 x Per Week/30 Days ary Secondary Dressing: ADAPTIC TOUCH 3x4.25 in 1 x Per Week/30 Days Discharge Instructions: Apply over primary dressing as directed. Secondary Dressing: Woven Gauze Sponge, Non-Sterile 4x4 in (Generic) 1 x Per Week/30 Days Discharge Instructions: Apply over primary dressing as directed. Secured With: [employer], 4.5x3.1  (in/yd) (Generic) 1 x Per Week/30 Days Discharge Instructions: Secure with Kerlix as directed. Secured With: 85M Medipore Public affairs consultant Surgical T 2x10 (in/yd) (Generic) 1 x Per Week/30 Days ape Discharge Instructions: Secure with tape as directed. 01/08/2023: There has been more epithelialization of the wound. There is some dry eschar on the surface of the larger more distal aspect and around the edges. I used a curette to debride the eschar and dry skin from the surface of the wound and around the parameters. TheraSkin #5 was then thawed and applied in standard fashion. 100% of the product was used. It was secured with Adaptic and Steri-Strips. She will change the outer dressing on her own at home and return in 2 weeks for a follow-up visit. Electronic Signature(s) Signed: 01/08/2023 2:50:34 PM By: Destiny Maudlin MD FACS Entered By: Destiny [name] on 01/08/2023 14:50:34 -------------------------------------------------------------------------------- HxROS Details Patient Name: Date of Service: Destiny Dark F. 01/08/2023 2:00 PM Medical Record Number: [id number] Patient Account Number: [id number] Date of Birth/Sex: Treating RN: 06/26/1946 (77 y.o. F) Primary Care Provider: Deland [name]  Other Clinician: Referring Provider: Treating Provider/Extender: Destiny [name] in Treatment: 33 Information Obtained From Patient Eyes Medical History: Past Medical History Notes: Macular Degeneration Ear/Nose/Mouth/Throat Medical History: Past Medical History Notes: Tinnitus Endocrine Destiny [name], Destiny [name] ([id number]) [id number] Page 9 of 10 Medical History: Positive for: Type II Diabetes - Metformin pillsand Ozempic Time with diabetes: 25 years Treated with: Oral agents Blood sugar tested every day: Yes Tested : every day Musculoskeletal Medical History: Positive for: Osteoarthritis Oncologic Medical History: Past Medical History  Notes: 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 has had Chemotherapy (stopped) and Radiation -8 treatments left as of 05/19/22 Rectal cancer Psychiatric Medical History: Past Medical History Notes: Hx Depression Immunizations Pneumococcal Vaccine: Received Pneumococcal Vaccination: Yes Received Pneumococcal Vaccination On or After 60th Birthday: Yes Implantable Devices None Hospitalization / Surgery History Type of Hospitalization/Surgery Melanoma excision to left heel;[facility] Left ankle fracture; rectal surgery Family and Social History Former smoker - 1991; Marital Status - Divorced; Alcohol Use: Never; Drug Use: No History; Caffeine Use: Moderate - Diet coke,coffee; Financial Concerns: No; Food, Clothing or Shelter Needs: No; Support System Lacking: No; Transportation Concerns: No Electronic Signature(s) Signed: 01/08/2023 2:58:03 PM By: Destiny Maudlin MD FACS Entered By: Destiny [name] on 01/08/2023 14:48:58 -------------------------------------------------------------------------------- Ada Details Patient Name: Date of Service: Destiny [name] 01/08/2023 Medical Record Number: [id number] Patient Account Number: [id number] Date of Birth/Sex: Treating RN: 1946/04/07 (77 y.o. F) Primary Care Provider: Deland [name] Other Clinician: Referring Provider: Treating Provider/Extender: Destiny [name] in Treatment: 33 Diagnosis Coding ICD-10 Codes Code Description [phone] Non-pressure chronic ulcer of left heel and midfoot with fat layer exposed T81.31XS Disruption of external operation (surgical) wound, not elsewhere classified, sequela C43.9 Malignant melanoma of skin, unspecified C20 Malignant neoplasm of rectum E11.9 Type 2 diabetes mellitus without complications [id number] Personal history of antineoplastic chemotherapy Z92.3 Personal history of irradiation Facility Procedures : Destiny [name], Destiny [name] CodeELSYE [name] ([id number] [id number] Q Description: 2) [id number] Theraskin per 1sq cm Large -39 sq cm Modifier: [id number] Quantity: 7.pdf Page 10 of 10 : CPT4 Code: [id number] I Description: 5275 - SKIN SUB GRAFT FACE/NK/HF/G CD-10 Diagnosis Description L97.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 Modifier: 1 Quantity: Physician Procedures : CPT4 Code Description Modifier [id number] - WC PHYS LEVEL 3 - EST PT 25 ICD-10 Diagnosis Description L97.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 T81.31XS Disruption of external operation (surgical) wound, not  elsewhere classified, sequela Z92.21 Personal history of antineoplastic chemotherapy Z92.3 Personal history of irradiation Quantity: 1 : [id number] - WC PHYS SKIN SUB GRAFT FACE/NK/HF/G ICD-10 Diagnosis Description L97.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 Quantity: 1 Electronic Signature(s) Signed: 01/08/2023 4:10:17 PM By: Destiny Maudlin MD FACS Signed: 01/08/2023 4:40:02 PM By: Destiny [name] Previous Signature: 01/08/2023 2:50:57 PM Version By: Destiny Maudlin MD FACS Entered By: Destiny [name] on 01/08/2023 16:01:52

## 2023-01-14 DIAGNOSIS — Z85820 Personal history of malignant melanoma of skin: Secondary | ICD-10-CM | POA: Diagnosis not present

## 2023-01-14 DIAGNOSIS — Z23 Encounter for immunization: Secondary | ICD-10-CM | POA: Diagnosis not present

## 2023-01-14 DIAGNOSIS — E1140 Type 2 diabetes mellitus with diabetic neuropathy, unspecified: Secondary | ICD-10-CM | POA: Diagnosis not present

## 2023-01-14 DIAGNOSIS — Z85048 Personal history of other malignant neoplasm of rectum, rectosigmoid junction, and anus: Secondary | ICD-10-CM | POA: Diagnosis not present

## 2023-01-14 DIAGNOSIS — R221 Localized swelling, mass and lump, neck: Secondary | ICD-10-CM | POA: Diagnosis not present

## 2023-01-14 DIAGNOSIS — Z0000 Encounter for general adult medical examination without abnormal findings: Secondary | ICD-10-CM | POA: Diagnosis not present

## 2023-01-14 DIAGNOSIS — E039 Hypothyroidism, unspecified: Secondary | ICD-10-CM | POA: Diagnosis not present

## 2023-01-14 DIAGNOSIS — M810 Age-related osteoporosis without current pathological fracture: Secondary | ICD-10-CM | POA: Diagnosis not present

## 2023-01-14 DIAGNOSIS — D649 Anemia, unspecified: Secondary | ICD-10-CM | POA: Diagnosis not present

## 2023-01-14 DIAGNOSIS — I6529 Occlusion and stenosis of unspecified carotid artery: Secondary | ICD-10-CM | POA: Diagnosis not present

## 2023-01-14 DIAGNOSIS — J309 Allergic rhinitis, unspecified: Secondary | ICD-10-CM | POA: Diagnosis not present

## 2023-01-15 DIAGNOSIS — D649 Anemia, unspecified: Secondary | ICD-10-CM | POA: Diagnosis not present

## 2023-01-22 ENCOUNTER — Ambulatory Visit (HOSPITAL_BASED_OUTPATIENT_CLINIC_OR_DEPARTMENT_OTHER): Payer: PPO | Admitting: General Surgery

## 2023-01-22 DIAGNOSIS — H02831 Dermatochalasis of right upper eyelid: Secondary | ICD-10-CM | POA: Diagnosis not present

## 2023-01-22 DIAGNOSIS — H1045 Other chronic allergic conjunctivitis: Secondary | ICD-10-CM | POA: Diagnosis not present

## 2023-01-22 DIAGNOSIS — H16213 Exposure keratoconjunctivitis, bilateral: Secondary | ICD-10-CM | POA: Diagnosis not present

## 2023-01-22 DIAGNOSIS — H02834 Dermatochalasis of left upper eyelid: Secondary | ICD-10-CM | POA: Diagnosis not present

## 2023-01-22 DIAGNOSIS — H26491 Other secondary cataract, right eye: Secondary | ICD-10-CM | POA: Diagnosis not present

## 2023-01-22 DIAGNOSIS — H353132 Nonexudative age-related macular degeneration, bilateral, intermediate dry stage: Secondary | ICD-10-CM | POA: Diagnosis not present

## 2023-01-22 DIAGNOSIS — H04123 Dry eye syndrome of bilateral lacrimal glands: Secondary | ICD-10-CM | POA: Diagnosis not present

## 2023-01-22 DIAGNOSIS — E119 Type 2 diabetes mellitus without complications: Secondary | ICD-10-CM | POA: Diagnosis not present

## 2023-01-22 DIAGNOSIS — H16143 Punctate keratitis, bilateral: Secondary | ICD-10-CM | POA: Diagnosis not present

## 2023-01-23 ENCOUNTER — Encounter (HOSPITAL_BASED_OUTPATIENT_CLINIC_OR_DEPARTMENT_OTHER): Payer: PPO | Attending: General Surgery | Admitting: General Surgery

## 2023-01-23 DIAGNOSIS — Z08 Encounter for follow-up examination after completed treatment for malignant neoplasm: Secondary | ICD-10-CM | POA: Diagnosis not present

## 2023-01-23 DIAGNOSIS — M1990 Unspecified osteoarthritis, unspecified site: Secondary | ICD-10-CM | POA: Diagnosis not present

## 2023-01-23 DIAGNOSIS — T8131XA Disruption of external operation (surgical) wound, not elsewhere classified, initial encounter: Secondary | ICD-10-CM | POA: Insufficient documentation

## 2023-01-23 DIAGNOSIS — E11621 Type 2 diabetes mellitus with foot ulcer: Secondary | ICD-10-CM | POA: Diagnosis not present

## 2023-01-23 DIAGNOSIS — Z09 Encounter for follow-up examination after completed treatment for conditions other than malignant neoplasm: Secondary | ICD-10-CM | POA: Diagnosis not present

## 2023-01-23 DIAGNOSIS — L97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: Secondary | ICD-10-CM | POA: Insufficient documentation

## 2023-01-23 DIAGNOSIS — Z9221 Personal history of antineoplastic chemotherapy: Secondary | ICD-10-CM | POA: Insufficient documentation

## 2023-01-23 DIAGNOSIS — Z87891 Personal history of nicotine dependence: Secondary | ICD-10-CM | POA: Insufficient documentation

## 2023-01-23 DIAGNOSIS — Z923 Personal history of irradiation: Secondary | ICD-10-CM | POA: Diagnosis not present

## 2023-01-23 DIAGNOSIS — Z85820 Personal history of malignant melanoma of skin: Secondary | ICD-10-CM | POA: Diagnosis not present

## 2023-01-23 DIAGNOSIS — Z85048 Personal history of other malignant neoplasm of rectum, rectosigmoid junction, and anus: Secondary | ICD-10-CM | POA: Insufficient documentation

## 2023-01-29 NOTE — Progress Notes (Signed)
Destiny, [name] ([id number]) [id number] Page 1 of 8 Visit Report for 01/23/2023 Arrival Information Details Patient Name: Date of Service: Destiny [name], Destiny [name] 01/23/2023 1:15 PM Medical Record Number: [id number] Patient Account Number: [id number] Date of Birth/Sex: Treating RN: Aug 14, 1946 (77 y.o. F) Primary Care Mannie Ohlin: Merri Brunette Other Clinician: Referring Marquia Costello: Treating Cammy Sanjurjo/Extender: Willey Blade in Treatment: 35 Visit Information History Since Last Visit Added or deleted any medications: No Patient Arrived: Ambulatory Any new allergies or adverse reactions: No Arrival Time: 13:07 Had a fall or experienced change in No Accompanied By: self activities of daily living that may affect Transfer Assistance: None risk of falls: Patient Identification Verified: Yes Signs or symptoms of abuse/neglect since last visito No Secondary Verification Process Completed: Yes Hospitalized since last visit: No Patient Requires Transmission-Based Precautions: No Implantable device outside of the clinic excluding No Patient Has Alerts: No cellular tissue based products placed in the center since last visit: Has Dressing in Place as Prescribed: Yes Pain Present Now: No Electronic Signature(s) Signed: 01/29/2023 1:56:54 PM By: Brenton Grills Entered By: Brenton Grills on 01/23/2023 13:55:17 -------------------------------------------------------------------------------- Clinic Level of Care Assessment Details Patient Name: Date of Service: Destiny [name], Destiny [name] 01/23/2023 1:15 PM Medical Record Number: [id number] Patient Account Number: [id number] Date of Birth/Sex: Treating RN: 1945-10-20 (77 y.o. Gevena [name] Primary Care Okey Zelek: Merri Brunette Other Clinician: Referring Zyionna Pesce: Treating Naiyah Klostermann/Extender: Willey Blade in Treatment: 35 Clinic Level of Care Assessment Items TOOL 4 Quantity Score X-  1 0 Use when only an EandM is performed on FOLLOW-UP visit ASSESSMENTS - Nursing Assessment / Reassessment X- 1 10 Reassessment of Co-morbidities (includes updates in patient status) X- 1 5 Reassessment of Adherence to Treatment Plan ASSESSMENTS - Wound and Skin A ssessment / Reassessment X - Simple Wound Assessment / Reassessment - one wound 1 5 []  - 0 Complex Wound Assessment / Reassessment - multiple wounds []  - 0 Dermatologic / Skin Assessment (not related to wound area) ASSESSMENTS - Focused Assessment []  - 0 Circumferential Edema Measurements - multi extremities []  - 0 Nutritional Assessment / Counseling / Intervention []  - 0 Lower Extremity Assessment (monofilament, tuning fork, pulses) []  - 0 Peripheral Arterial Disease Assessment (using hand held doppler) ASSESSMENTS - Ostomy and/or Continence Assessment and Care []  - 0 Incontinence Assessment and Management []  - 0 Ostomy Care Assessment and Management (repouching, etc.) PROCESS - Coordination of Care X - Simple Patient / Family Education for ongoing care 1 [address] F ([id number]) [id number] Page 2 of 8 []  - 0 Complex (extensive) Patient / Family Education for ongoing care []  - 0 Staff obtains Chiropractor, Records, T Results / Process Orders est X- 1 10 Staff telephones HHA, Nursing Homes / Clarify orders / etc []  - 0 Routine Transfer to another Facility (non-emergent condition) []  - 0 Routine Hospital Admission (non-emergent condition) []  - 0 New Admissions / Manufacturing engineer / Ordering NPWT Apligraf, etc. , []  - 0 Emergency Hospital Admission (emergent condition) X- 1 10 Simple Discharge Coordination []  - 0 Complex (extensive) Discharge Coordination PROCESS - Special Needs []  - 0 Pediatric / Minor Patient Management []  - 0 Isolation Patient Management []  - 0 Hearing / Language / Visual special needs []  - 0 Assessment of Community assistance (transportation,  D/C planning, etc.) []  - 0 Additional assistance / Altered mentation []  - 0 Support Surface(s) Assessment (bed, cushion, seat, etc.) INTERVENTIONS - Wound Cleansing / Measurement X - Simple Wound Cleansing - one wound 1  5 []  - 0 Complex Wound Cleansing - multiple wounds []  - 0 Wound Imaging (photographs - any number of wounds) []  - 0 Wound Tracing (instead of photographs) X- 1 5 Simple Wound Measurement - one wound []  - 0 Complex Wound Measurement - multiple wounds INTERVENTIONS - Wound Dressings X - Small Wound Dressing one or multiple wounds 1 10 []  - 0 Medium Wound Dressing one or multiple wounds []  - 0 Large Wound Dressing one or multiple wounds []  - 0 Application of Medications - topical []  - 0 Application of Medications - injection INTERVENTIONS - Miscellaneous []  - 0 External ear exam []  - 0 Specimen Collection (cultures, biopsies, blood, body fluids, etc.) []  - 0 Specimen(s) / Culture(s) sent or taken to Lab for analysis []  - 0 Patient Transfer (multiple staff / Nurse, adult / Similar devices) []  - 0 Simple Staple / Suture removal (25 or less) []  - 0 Complex Staple / Suture removal (26 or more) []  - 0 Hypo / Hyperglycemic Management (close monitor of Blood Glucose) []  - 0 Ankle / Brachial Index (ABI) - do not check if billed separately X- 1 5 Vital Signs Has the patient been seen at the hospital within the last three years: Yes Total Score: 80 Level Of Care: New/Established - Level 3 Electronic Signature(s) Signed: 01/29/2023 1:56:54 PM By: Brenton Grills Entered By: Brenton Grills on 01/23/2023 13:45:20 Edgerly, Lenon Ahmadi ([id number]) [id number] Page 3 of 8 -------------------------------------------------------------------------------- Encounter Discharge Information Details Patient Name: Date of Service: Destiny [name], SCOGGIN 01/23/2023 1:15 PM Medical Record Number: [id number] Patient Account Number: [id number] Date of Birth/Sex:  Treating RN: Dec 03, 1945 (77 y.o. Gevena [name] Primary Care Courtney Fenlon: Merri Brunette Other Clinician: Referring Alette Kataoka: Treating Nickey Kloepfer/Extender: Willey Blade in Treatment: 35 Encounter Discharge Information Items Post Procedure Vitals Discharge Condition: Stable Temperature (F): 98.6 Ambulatory Status: Ambulatory Pulse (bpm): 74 Discharge Destination: Home Respiratory Rate (breaths/min): 18 Transportation: Private Auto Blood Pressure (mmHg): 126/65 Accompanied By: self Schedule Follow-up Appointment: Yes Clinical Summary of Care: Patient Declined Electronic Signature(s) Signed: 01/29/2023 1:56:54 PM By: Brenton Grills Entered By: Brenton Grills on 01/23/2023 13:47:48 -------------------------------------------------------------------------------- Lower Extremity Assessment Details Patient Name: Date of Service: Destiny [name], Destiny [name] 01/23/2023 1:15 PM Medical Record Number: [id number] Patient Account Number: [id number] Date of Birth/Sex: Treating RN: [birth year] (77 y.o. F) Primary Care Derwin Reddy: Merri Brunette Other Clinician: Referring Rebekha Diveley: Treating Kanisha Duba/Extender: Willey Blade in Treatment: 35 Edema Assessment Assessed: Kyra Searles: No] Franne Forts: No] Edema: [Left: N] [Right: o] Calf Left: Right: Point of Measurement: 29 cm From Medial Instep 35 cm Ankle Left: Right: Point of Measurement: 10 cm From Medial Instep 21 cm Electronic Signature(s) Signed: 01/28/2023 9:53:02 AM By: Karl Ito Entered By: Karl Ito on 01/23/2023 13:07:39 -------------------------------------------------------------------------------- Multi Wound Chart Details Patient Name: Date of Service: Earvin Hansen. 01/23/2023 1:15 PM Medical Record Number: [id number] Patient Account Number: [id number] Date of Birth/Sex: Treating RN: July 20, 1946 (77 y.o. F) Primary Care Tephanie Escorcia: Merri Brunette Other Clinician: Referring  Artemisa Sladek: Treating Jordani Nunn/Extender: Willey Blade in Treatment: 35 Vital Signs Height(in): 63 Pulse(bpm): 74 Weight(lbs): 182 Blood Pressure(mmHg): 126/65 Body Mass Index(BMI): 32.2 Temperature(F): 98.6 Vermeer, Rashaun F ([id number]) [id number] Page 4 of 8 Respiratory Rate(breaths/min): 18 [1:Photos:] [N/A:N/A] Left Calcaneus N/A N/A Wound Location: Hematoma N/A N/A Wounding Event: Diabetic Wound/Ulcer of the Lower N/A N/A Primary Etiology: Extremity Type II Diabetes, Osteoarthritis N/A N/A Comorbid History: 05/05/2022 N/A N/A Date Acquired: 10 N/A N/A Weeks of Treatment: Open N/A N/A  Wound Status: No N/A N/A Wound Recurrence: 1.5x1x0.1 N/A N/A Measurements L x W x D (cm) 1.178 N/A N/A A (cm) : rea 0.118 N/A N/A Volume (cm) : 82.70% N/A N/A % Reduction in A rea: 91.30% N/A N/A % Reduction in Volume: Grade 1 N/A N/A Classification: Medium N/A N/A Exudate A mount: Serosanguineous N/A N/A Exudate Type: red, brown N/A N/A Exudate Color: Distinct, outline attached N/A N/A Wound Margin: Large (67-100%) N/A N/A Granulation A mount: Red N/A N/A Granulation Quality: Small (1-33%) N/A N/A Necrotic A mount: Fat Layer (Subcutaneous Tissue): Yes N/A N/A Exposed Structures: Fascia: No Tendon: No Muscle: No Joint: No Bone: No Medium (34-66%) N/A N/A Epithelialization: Debridement - Selective/Open Wound N/A N/A Debridement: Pre-procedure Verification/Time Out 13:32 N/A N/A Taken: Lidocaine 4% Topical Solution N/A N/A Pain Control: Slough N/A N/A Tissue Debrided: Non-Viable Tissue N/A N/A Level: 1.5 N/A N/A Debridement A (sq cm): rea Curette N/A N/A Instrument: Minimum N/A N/A Bleeding: Pressure N/A N/A Hemostasis A chieved: Procedure was tolerated well N/A N/A Debridement Treatment Response: 1.5x1x0.1 N/A N/A Post Debridement Measurements L x W x D (cm) 0.118 N/A N/A Post Debridement  Volume: (cm) Callus: Yes N/A N/A Periwound Skin Texture: Dry/Scaly: Yes N/A N/A Periwound Skin Moisture: Maceration: No No Abnormalities Noted N/A N/A Periwound Skin Color: No Abnormality N/A N/A Temperature: Debridement N/A N/A Procedures Performed: Treatment Notes Wound #1 (Calcaneus) Wound Laterality: Left Cleanser Normal Saline Discharge Instruction: Cleanse the wound with Normal Saline prior to applying a clean dressing using gauze sponges, not tissue or cotton balls. Soap and Water Discharge Instruction: May shower and wash wound with dial antibacterial soap and water prior to dressing change. Wound Cleanser Discharge Instruction: Cleanse the wound with wound cleanser prior to applying a clean dressing using gauze sponges, not tissue or cotton balls. Peri-Wound Care Topical Skintegrity Hydrogel 4 (oz) Discharge Instruction: Apply hydrogel as directed ANGELETTE, GANUS ([id number]) [id number] Page 5 of 8 Primary Dressing Endoform 2x2 in Discharge Instruction: Moisten with saline Secondary Dressing Woven Gauze Sponge, Non-Sterile 4x4 in Discharge Instruction: Apply over primary dressing as directed. Secured With [employer], 4.5x3.1 (in/yd) Discharge Instruction: Secure with Kerlix as directed. 51M Medipore Soft Cloth Surgical T 2x10 (in/yd) ape Discharge Instruction: Secure with tape as directed. Compression Wrap Compression Stockings Add-Ons Electronic Signature(s) Signed: 01/23/2023 1:47:14 PM By: Duanne Guess MD FACS Entered By: Duanne Guess on 01/23/2023 13:47:14 -------------------------------------------------------------------------------- Multi-Disciplinary Care Plan Details Patient Name: Date of Service: DONNAE, MICHELS 01/23/2023 1:15 PM Medical Record Number: [id number] Patient Account Number: [id number] Date of Birth/Sex: Treating RN: [birth year] (77 y.o. Fredderick Phenix Primary Care Bailynn Dyk: Merri Brunette Other Clinician: Referring Severin Bou: Treating Baptiste Littler/Extender: Willey Blade in Treatment: 35 Multidisciplinary Care Plan reviewed with physician Active Inactive Necrotic Tissue Nursing Diagnoses: Impaired tissue integrity related to necrotic/devitalized tissue Knowledge deficit related to management of necrotic/devitalized tissue Goals: Necrotic/devitalized tissue will be minimized in the wound bed Date Initiated: 08/08/2022 Target Resolution Date: 02/13/2023 Goal Status: Active Patient/caregiver will verbalize understanding of reason and process for debridement of necrotic tissue Date Initiated: 08/08/2022 Target Resolution Date: 02/13/2023 Goal Status: Active Interventions: Assess patient pain level pre-, during and post procedure and prior to discharge Provide education on necrotic tissue and debridement process Treatment Activities: Apply topical anesthetic as ordered : 08/08/2022 Notes: Electronic Signature(s) Signed: 01/23/2023 3:45:26 PM By: Samuella Bruin Signed: 01/29/2023 1:56:54 PM By: Brenton Grills Entered By: Brenton Grills on 01/23/2023 13:43:27 Mulhall, Lenon Ahmadi ([id number]) [id number] Page 6  of 8 -------------------------------------------------------------------------------- Pain Assessment Details Patient Name: Date of Service: Destiny [name], Destiny [name] 01/23/2023 1:15 PM Medical Record Number: [id number] Patient Account Number: [id number] Date of Birth/Sex: Treating RN: [birth year] (77 y.o. F) Primary Care Kestrel Mis: Merri Brunette Other Clinician: Referring Haelie Clapp: Treating Dashley Monts/Extender: Willey Blade in Treatment: 35 Active Problems Location of Pain Severity and Description of Pain Patient Has Paino No Site Locations Rate the pain. Current Pain Level: 0 Pain Management and Medication Current Pain Management: Electronic Signature(s) Signed: 01/28/2023 9:53:02 AM By:  Karl Ito Entered By: Karl Ito on 01/23/2023 13:07:32 -------------------------------------------------------------------------------- Patient/Caregiver Education Details Patient Name: Date of Service: Earvin Hansen 4/12/2024andnbsp1:15 PM Medical Record Number: [id number] Patient Account Number: [id number] Date of Birth/Gender: Treating RN: 07-13-1946 (77 y.o. Fredderick Phenix Primary Care Physician: Merri Brunette Other Clinician: Referring Physician: Treating Physician/Extender: Willey Blade in Treatment: 35 Education Assessment Education Provided To: Patient Education Topics Provided Wound/Skin Impairment: Methods: Explain/Verbal Responses: Reinforcements needed, State content correctly Electronic Signature(s) Signed: 01/29/2023 1:56:54 PM By: Brenton Grills Entered By: Brenton Grills on 01/23/2023 13:43:45 Wound Assessment Details -------------------------------------------------------------------------------- Chiquita Loth ([id number]) [id number] Page 7 of 8 Patient Name: Date of Service: Destiny [name], Destiny [name] 01/23/2023 1:15 PM Medical Record Number: [id number] Patient Account Number: [id number] Date of Birth/Sex: Treating RN: 08-15-1946 (77 y.o. F) Primary Care Meryl Ponder: Merri Brunette Other Clinician: Referring Dyanna Seiter: Treating Shaniah Baltes/Extender: Willey Blade in Treatment: 35 Wound Status Wound Number: 1 Primary Etiology: Diabetic Wound/Ulcer of the Lower Extremity Wound Location: Left Calcaneus Wound Status: Open Wounding Event: Hematoma Comorbid History: Type II Diabetes, Osteoarthritis Date Acquired: 05/05/2022 Weeks Of Treatment: 35 Clustered Wound: No Photos Wound Measurements Length: (cm) 1.5 % Reduction in Area: 82.7% Width: (cm) 1 % Reduction in Volume: 91.3% Depth: (cm) 0.1 Epithelialization: Medium (34-66%) Area: (cm) 1.178 Tunneling: No Volume:  (cm) 0.118 Undermining: No Wound Description Classification: Grade 1 Foul Odor After Cleansing: No Wound Margin: Distinct, outline attached Slough/Fibrino Yes Exudate Amount: Medium Exudate Type: Serosanguineous Exudate Color: red, brown Wound Bed Granulation Amount: Large (67-100%) Exposed Structure Granulation Quality: Red Fascia Exposed: No Necrotic Amount: Small (1-33%) Fat Layer (Subcutaneous Tissue) Exposed: Yes Necrotic Quality: Adherent Slough Tendon Exposed: No Muscle Exposed: No Joint Exposed: No Bone Exposed: No Periwound Skin Texture Texture Color No Abnormalities Noted: No No Abnormalities Noted: Yes Callus: Yes Temperature / Pain Temperature: No Abnormality Moisture No Abnormalities Noted: Yes Treatment Notes Wound #1 (Calcaneus) Wound Laterality: Left Cleanser Normal Saline Discharge Instruction: Cleanse the wound with Normal Saline prior to applying a clean dressing using gauze sponges, not tissue or cotton balls. Soap and Water Discharge Instruction: May shower and wash wound with dial antibacterial soap and water prior to dressing change. Wound Cleanser Discharge Instruction: Cleanse the wound with wound cleanser prior to applying a clean dressing using gauze sponges, not tissue or cotton balls. Peri-Wound Care MAIA, HANDA ([id number]) [phone].pdf Page 8 of 8 Topical Skintegrity Hydrogel 4 (oz) Discharge Instruction: Apply hydrogel as directed Primary Dressing Endoform 2x2 in Discharge Instruction: Moisten with saline Secondary Dressing Woven Gauze Sponge, Non-Sterile 4x4 in Discharge Instruction: Apply over primary dressing as directed. Secured With [employer], 4.5x3.1 (in/yd) Discharge Instruction: Secure with Kerlix as directed. 33M Medipore Soft Cloth Surgical T 2x10 (in/yd) ape Discharge Instruction: Secure with tape as directed. Compression Wrap Compression Stockings Add-Ons Electronic  Signature(s) Signed: 01/23/2023 3:45:26 PM By: Samuella Bruin Entered By: Samuella Bruin on 01/23/2023 13:08:56 -------------------------------------------------------------------------------- Vitals Details Patient Name:  Date of Service: Destiny [name], Destiny [name] 01/23/2023 1:15 PM Medical Record Number: [id number] Patient Account Number: [id number] Date of Birth/Sex: Treating RN: [birth year] (77 y.o. F) Primary Care Maddisyn Hegwood: Merri Brunette Other Clinician: Referring Remee Charley: Treating Kyzer Blowe/Extender: Willey Blade in Treatment: 35 Vital Signs Time Taken: 13:00 Temperature (F): 98.6 Height (in): 63 Pulse (bpm): 74 Weight (lbs): 182 Respiratory Rate (breaths/min): 18 Body Mass Index (BMI): 32.2 Blood Pressure (mmHg): 126/65 Reference Range: 80 - 120 mg / dl Electronic Signature(s) Signed: 01/28/2023 9:53:02 AM By: Karl Ito Entered By: Karl Ito on 01/23/2023 13:06:50

## 2023-01-29 NOTE — Progress Notes (Signed)
NALA, KACHEL ([id number]) [id number] Page 1 of 9 Visit Report for 01/23/2023 Chief Complaint Document Details Patient Name: Date of Service: Destiny [name], Destiny [name] 01/23/2023 1:15 PM Medical Record Number: [id number] Patient Account Number: [id number] Date of Birth/Sex: Treating RN: 08/24/1946 (77 y.o. F) Primary Care Provider: Merri Brunette Other Clinician: Referring Provider: Treating Provider/Extender: Willey Blade in Treatment: 35 Information Obtained from: Patient Chief Complaint Patient presents to the wound care center with open non-healing surgical wound(s) Electronic Signature(s) Signed: 01/23/2023 1:47:22 PM By: Duanne Guess MD FACS Entered By: Duanne Guess on 01/23/2023 13:47:22 -------------------------------------------------------------------------------- Debridement Details Patient Name: Date of Service: Destiny [name]. 01/23/2023 1:15 PM Medical Record Number: [id number] Patient Account Number: [id number] Date of Birth/Sex: Treating RN: 09-01-1946 (77 y.o. Gevena Mart Primary Care Provider: Merri Brunette Other Clinician: Referring Provider: Treating Provider/Extender: Willey Blade in Treatment: 35 Debridement Performed for Assessment: Wound #1 Left Calcaneus Performed By: Physician Duanne Guess, MD Debridement Type: Debridement Severity of Tissue Pre Debridement: Fat layer exposed Level of Consciousness (Pre-procedure): Awake and Alert Pre-procedure Verification/Time Out Yes - 13:32 Taken: Start Time: 13:33 Pain Control: Lidocaine 4% T opical Solution T Area Debrided (L x W): otal 1.5 (cm) x 1 (cm) = 1.5 (cm) Tissue and other material debrided: Non-Viable, Slough, Slough Level: Non-Viable Tissue Debridement Description: Selective/Open Wound Instrument: Curette Bleeding: Minimum Hemostasis Achieved: Pressure Response to Treatment: Procedure was tolerated  well Level of Consciousness (Post- Awake and Alert procedure): Post Debridement Measurements of Total Wound Length: (cm) 1.5 Width: (cm) 1 Depth: (cm) 0.1 Volume: (cm) 0.118 Character of Wound/Ulcer Post Debridement: Improved Severity of Tissue Post Debridement: Fat layer exposed Post Procedure Diagnosis Same as Pre-procedure Notes Scribed for Dr. Lady Gary by Brenton Grills RN. Electronic Signature(s) Signed: 01/23/2023 2:25:12 PM By: Duanne Guess MD FACS Signed: 01/29/2023 1:56:54 PM By: Brenton Grills Entered By: Brenton Grills on 01/23/2023 13:35:49 Liendo, Lenon Ahmadi ([id number]) [id number] Page 2 of 9 -------------------------------------------------------------------------------- HPI Details Patient Name: Date of Service: Destiny [name], Destiny [name] 01/23/2023 1:15 PM Medical Record Number: [id number] Patient Account Number: [id number] Date of Birth/Sex: Treating RN: [birth year] (77 y.o. 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 After the final reexcision, the wound had an ACell graft applied. She reports that she had good closure of skin over the site. Earlier this year, at the end of May, however, she was diagnosed with rectal cancer and is currently undergoing neoadjuvant therapy (chemotherapy and radiation) in anticipation of future surgery. She reports that about 2 weeks ago, she noted that her skin coverage over the heel was beginning to break down. Her oncologist felt that perhaps the Xeloda was contributing to wound failure and stopped this medication. She continues to receive neoadjuvant radiation therapy, however. She saw plastic surgery last week, and they  referred her to the wound care center for further evaluation and management. She is a type II diabetic, well controlled, with the most recent A1c available to me being 5.9%. ABIs done 1 year ago were normal. On the patient's left heel, there is an irregular wound with some skin maceration. The fat layer is exposed and there is slough over the entire surface. Outside of the area of maceration, the skin is in good condition. No erythema, induration, or  odor. No concern for infection. 05/26/2022: The wound is smaller today and is extremely clean, without any slough buildup. The periwound skin is in much better condition and is no longer macerated. She is scheduled to complete her radiation treatment on Wednesday. 06/03/2022: The wound continues to contract. There is just a little bit of eschar and callus accumulation. The surface is clean. She has completed all of her neoadjuvant therapy and is awaiting surgery in November. 06/10/2022: The wound is smaller by about a centimeter in all dimensions. She does have a little bit of eschar near the Achilles portion of the wound. The wound surface itself is robust and healthy-appearing. 06/24/2022: The wound continues to contract. There is just a small portion open in the center. She does have some dry skin and periwound callus circumferentially. No concern for infection. 07/08/2022: The wound is down to just a pinhole. There is a little bit of eschar on the surface. No concern for infection. 07/22/2022: Her wound is healed. 08/08/2022: Unfortunately, it seems that when her wound was being dressed at her last visit, the nurse applying the dressing pulled some dead skin off and reopened to the wound. I am not sure why this was not brought to my attention, but the patient has been caring for the wound at home with her supplies from her previous admission. She has been wearing her heel off loader and using Hydrofera Blue. There is some eschar accumulation, but no  erythema, induration, or other concern for infection. Her colorectal surgery is scheduled for next Wednesday. 09/03/2022: Ms. Chaudoin returns after undergoing her colon surgery. She has been dressing the wound at home with [facility], but has not been wearing her heel cup. The wound is more open today with a lot of periwound callus, dry skin, and a light layer of slough. No overt signs of infection. 09/17/2022: The wound is little bit smaller today. There is some periwound tissue maceration. She says she has been wearing her heel cup more regularly. 10/01/2022: The wound looks a little smaller in terms of the open dimensions. There is more fresh epithelium present. The periwound skin is actually a bit dry and cracked with peeling skin. 10/16/2022: The periwound skin is extremely dry, thickened, and cracked. The wound is about the same size. There is slough on the wound surface. 10/30/2022: The periwound skin is dry but not nearly as thickened or cracked. The wound is a little bit smaller and more superficial. Minimal slough accumulation. She apparently was unable to get the endoform from the DME supplier so when she ran out of the leftovers from her clinic visit, she went back to using [facility]. 11/13/2022: There is a lot of dry skin around the wound. Otherwise the surface is clean. She has been approved for [employer]. 11/27/2022: The wound surface has robust granulation tissue and there is epithelium creeping around the perimeter. 12/11/2022: There is more epithelium filling in around the wound perimeter. 3/15; left calcaneus wounds that we have been placing TheraSkin on. 2 small wounds divided by epithelization. 01/08/2023: There has been more epithelialization of the wound. There is some dry eschar on the surface of the larger more distal aspect and around the edges. 01/23/2023: Much of the wound has epithelialized. There are a couple of small open areas with beefy granulation tissue  present. Electronic Signature(s) Signed: 01/23/2023 1:48:06 PM By: Duanne Guess MD FACS Entered By: Duanne Guess on 01/23/2023 13:48:06 Physical Exam Details -------------------------------------------------------------------------------- Destiny [name] ([id number]) [id number] Page 3 of  9 Patient Name: Date of Service: Destiny [name], Destiny [name] 01/23/2023 1:15 PM Medical Record Number: [id number] Patient Account Number: [id number] Date of Birth/Sex: Treating RN: 1946-08-09 (77 y.o. F) Primary Care Provider: Merri Brunette Other Clinician: Referring Provider: Treating Provider/Extender: Willey Blade in Treatment: 35 Constitutional . . . . no acute distress. Respiratory Normal work of breathing on room air. Notes 01/23/2023: Much of the wound has epithelialized. There are a couple of small open areas with beefy granulation tissue present. Electronic Signature(s) Signed: 01/23/2023 1:50:03 PM By: Duanne Guess MD FACS Entered By: Duanne Guess on 01/23/2023 13:50:03 -------------------------------------------------------------------------------- Physician Orders Details Patient Name: Date of Service: Destiny [name] 01/23/2023 1:15 PM Medical Record Number: [id number] Patient Account Number: [id number] Date of Birth/Sex: Treating RN: 03-12-1946 (77 y.o. Gevena Mart Primary Care Provider: Merri Brunette Other Clinician: Referring Provider: Treating Provider/Extender: Willey Blade in Treatment: 62 Verbal / Phone Orders: No Diagnosis Coding ICD-10 Coding Code Description [id number] Non-pressure chronic ulcer of left heel and midfoot with fat layer exposed T81.31XS Disruption of external operation (surgical) wound, not elsewhere classified, sequela C43.9 Malignant melanoma of skin, unspecified C20 Malignant neoplasm of rectum E11.9 Type 2 diabetes mellitus without complications Z92.21 Personal  history of antineoplastic chemotherapy Z92.3 Personal history of irradiation Follow-up Appointments ppointment in 2 weeks. - Dr. Lady Gary RM 2 Return A Anesthetic Wound #1 Left Calcaneus (In clinic) Topical Lidocaine 4% applied to wound bed Bathing/ Shower/ Hygiene May shower and wash wound with soap and water. Edema Control - Lymphedema / SCD / Other Avoid standing for long periods of time. Moisturize legs daily. Off-Loading Other: - Please wear off-loading shoe on left foot to keep pressure off the heel Wound Treatment Wound #1 - Calcaneus Wound Laterality: Left Cleanser: Normal Saline (Generic) Every Other Day/30 Days Discharge Instructions: Cleanse the wound with Normal Saline prior to applying a clean dressing using gauze sponges, not tissue or cotton balls. Cleanser: Soap and Water Every Other Day/30 Days Discharge Instructions: May shower and wash wound with dial antibacterial soap and water prior to dressing change. Cleanser: Wound Cleanser Every Other Day/30 Days Discharge Instructions: Cleanse the wound with wound cleanser prior to applying a clean dressing using gauze sponges, not tissue or cotton balls. Destiny [name], Destiny [name] ([id number]) [id number] Page 4 of 9 Topical: Skintegrity Hydrogel 4 (oz) Every Other Day/30 Days Discharge Instructions: Apply hydrogel as directed Prim Dressing: Endoform 2x2 in Every Other Day/30 Days ary Discharge Instructions: Moisten with saline Secondary Dressing: Woven Gauze Sponge, Non-Sterile 4x4 in (Generic) Every Other Day/30 Days Discharge Instructions: Apply over primary dressing as directed. Secured With: [employer], 4.5x3.1 (in/yd) (Generic) Every Other Day/30 Days Discharge Instructions: Secure with Kerlix as directed. Secured With: 91M Medipore Scientist, research (life sciences) Surgical T 2x10 (in/yd) (Generic) Every Other Day/30 Days ape Discharge Instructions: Secure with tape as directed. Patient Medications llergies:  propofol, aspirin, atorvastatin, Lisinopril, hydrocodone, penicillin A Notifications Medication Indication Start End 01/23/2023 lidocaine DOSE topical 4 % cream - cream topical Electronic Signature(s) Signed: 01/23/2023 2:25:12 PM By: Duanne Guess MD FACS Entered By: Duanne Guess on 01/23/2023 13:50:19 -------------------------------------------------------------------------------- Problem List Details Patient Name: Date of Service: Destiny [name]. 01/23/2023 1:15 PM Medical Record Number: [id number] Patient Account Number: [id number] Date of Birth/Sex: Treating RN: 23-Dec-1945 (77 y.o. F) Primary Care Provider: Merri Brunette Other Clinician: Referring Provider: Treating Provider/Extender: Willey Blade in Treatment: 35 Active Problems ICD-10 Encounter Code Description Active Date MDM Diagnosis L97.422 Non-pressure chronic  ulcer of left heel and midfoot with fat layer exposed 05/19/2022 No Yes T81.31XS Disruption of external operation (surgical) wound, not elsewhere classified, 05/19/2022 No Yes sequela C43.9 Malignant melanoma of skin, unspecified 05/19/2022 No Yes C20 Malignant neoplasm of rectum 05/19/2022 No Yes E11.9 Type 2 diabetes mellitus without complications 05/19/2022 No Yes Z92.21 Personal history of antineoplastic chemotherapy 05/19/2022 No Yes Z92.3 Personal history of irradiation 05/19/2022 No Yes AZHIA, SIEFKEN ([id number]) [id number] Page 5 of 9 Inactive Problems Resolved Problems Electronic Signature(s) Signed: 01/23/2023 1:44:30 PM By: Duanne Guess MD FACS Entered By: Duanne Guess on 01/23/2023 13:44:30 -------------------------------------------------------------------------------- Progress Note Details Patient Name: Date of Service: Destiny [name]. 01/23/2023 1:15 PM Medical Record Number: [id number] Patient Account Number: [id number] Date of Birth/Sex: Treating RN: 07/30/1946 (77 y.o. 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 After the final reexcision, the wound had an ACell graft applied. She reports that she had good closure of skin over the site. Earlier this year, at the end of May, however, she was diagnosed with rectal cancer and is currently undergoing neoadjuvant therapy (chemotherapy and radiation) in anticipation of future surgery. She reports that about 2 weeks ago, she noted that her skin coverage over the heel was beginning to break down. Her oncologist felt that perhaps the Xeloda was contributing to wound failure and stopped this medication. She continues to receive neoadjuvant radiation therapy, however. She saw plastic surgery last week, and they referred her to the wound care center for further evaluation and management. She is a type II diabetic, well controlled, with the most recent A1c available to me being 5.9%. ABIs done 1 year ago were normal. On the patient's left heel, there is an irregular wound with some skin maceration. The fat layer is exposed and there is slough over the entire surface. Outside of the area of maceration, the skin is in good condition. No erythema, induration, or odor. No concern for infection. 05/26/2022: The wound is smaller today and is extremely clean, without any slough buildup. The periwound skin is in much better condition and is no longer macerated. She is scheduled to complete her radiation treatment on Wednesday. 06/03/2022: The wound continues to contract. There is just a little bit of eschar and callus  accumulation. The surface is clean. She has completed all of her neoadjuvant therapy and is awaiting surgery in November. 06/10/2022: The wound is smaller by about a centimeter in all dimensions. She does have a little bit of eschar near the Achilles portion of the wound. The wound surface itself is robust and healthy-appearing. 06/24/2022: The wound continues to contract. There is just a small portion open in the center. She does have some dry skin and periwound callus circumferentially. No concern for infection. 07/08/2022: The wound is down to just a pinhole. There is a little bit of eschar on the surface. No concern for infection. 07/22/2022: Her wound is healed. 08/08/2022: Unfortunately, it seems that when her wound was being dressed at her last visit, the nurse applying the dressing pulled some dead skin off and reopened to the wound. I am not sure why this was not brought to my attention, but the patient  has been caring for the wound at home with her supplies from her previous admission. She has been wearing her heel off loader and using Hydrofera Blue. There is some eschar accumulation, but no erythema, induration, or other concern for infection. Her colorectal surgery is scheduled for next Wednesday. 09/03/2022: Ms. Fellows returns after undergoing her colon surgery. She has been dressing the wound at home with [facility], but has not been wearing her heel cup. The wound is more open today with a lot of periwound callus, dry skin, and a light layer of slough. No overt signs of infection. 09/17/2022: The wound is little bit smaller today. There is some periwound tissue maceration. She says she has been wearing her heel cup more regularly. 10/01/2022: The wound looks a little smaller in terms of the open dimensions. There is more fresh epithelium present. The periwound skin is actually a bit dry and cracked with peeling skin. 10/16/2022: The periwound skin is extremely dry, thickened, and  cracked. The wound is about the same size. There is slough on the wound surface. 10/30/2022: The periwound skin is dry but not nearly as thickened or cracked. The wound is a little bit smaller and more superficial. Minimal slough accumulation. She apparently was unable to get the endoform from the DME supplier so when she ran out of the leftovers from her clinic visit, she went back to using [facility]. 11/13/2022: There is a lot of dry skin around the wound. Otherwise the surface is clean. She has been approved for [employer]. 11/27/2022: The wound surface has robust granulation tissue and there is epithelium creeping around the perimeter. 12/11/2022: There is more epithelium filling in around the wound perimeter. Destiny [name], Destiny [name] ([id number]) [id number] Page 6 of 9 3/15; left calcaneus wounds that we have been placing TheraSkin on. 2 small wounds divided by epithelization. 01/08/2023: There has been more epithelialization of the wound. There is some dry eschar on the surface of the larger more distal aspect and around the edges. 01/23/2023: Much of the wound has epithelialized. There are a couple of small open areas with beefy granulation tissue present. Patient History Information obtained from Patient. Social History Former smoker - 1991, Marital Status - Divorced, Alcohol Use - Never, Drug Use - No History, Caffeine Use - Moderate - Diet coke,coffee. Medical History Endocrine Patient has history of Type II Diabetes - Metformin pillsand Ozempic Musculoskeletal Patient has history of Osteoarthritis Hospitalization/Surgery History - Melanoma excision to left heel;[facility]IF Left ankle fracture;Marland Kitchen - rectal surgery. Medical A Surgical History Notes nd Eyes Macular Degeneration Ear/Nose/Mouth/Throat Tinnitus Oncologic 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 has had Chemotherapy (stopped) and Radiation -8 treatments left as of 05/19/22 Rectal cancer Psychiatric Hx  Depression Objective Constitutional no acute distress. Vitals Time Taken: 1:00 PM, Height: 63 in, Weight: 182 lbs, BMI: 32.2, Temperature: 98.6 F, Pulse: 74 bpm, Respiratory Rate: 18 breaths/min, Blood Pressure: 126/65 mmHg. Respiratory Normal work of breathing on room air. General Notes: 01/23/2023: Much of the wound has epithelialized. There are a couple of small open areas with beefy granulation tissue present. Integumentary (Hair, Skin) Wound #1 status is Open. Original cause of wound was Hematoma. The date acquired was: 05/05/2022. The wound has been in treatment 35 weeks. The wound is located on the Left Calcaneus. The wound measures 1.5cm length x 1cm width x 0.1cm depth; 1.178cm^2 area and 0.118cm^3 volume. There is Fat Layer (Subcutaneous Tissue) exposed. There is no tunneling or undermining noted. There is a medium amount of serosanguineous drainage noted. The  wound margin is distinct with the outline attached to the wound base. There is large (67-100%) red granulation within the wound bed. There is a small (1-33%) amount of necrotic tissue within the wound bed including Adherent Slough. The periwound skin appearance had no abnormalities noted for moisture. The periwound skin appearance had no abnormalities noted for color. The periwound skin appearance exhibited: Callus. Periwound temperature was noted as No Abnormality. Assessment Active Problems ICD-10 Non-pressure chronic ulcer of left heel and midfoot with fat layer exposed Disruption of external operation (surgical) wound, not elsewhere classified, sequela Malignant melanoma of skin, unspecified Malignant neoplasm of rectum Type 2 diabetes mellitus without complications Personal history of antineoplastic chemotherapy Personal history of irradiation Procedures Destiny [name], Destiny [name] ([id number]) [id number] Page 7 of 9 Wound #1 Pre-procedure diagnosis of Wound #1 is a Diabetic Wound/Ulcer of the Lower  Extremity located on the Left Calcaneus .Severity of Tissue Pre Debridement is: Fat layer exposed. There was a Selective/Open Wound Non-Viable Tissue Debridement with a total area of 1.5 sq cm performed by Duanne Guess, MD. With the following instrument(s): Curette to remove Non-Viable tissue/material. Material removed includes [facility] after achieving pain control using Lidocaine 4% T opical Solution. No specimens were taken. A time out was conducted at 13:32, prior to the start of the procedure. A Minimum amount of bleeding was controlled with Pressure. The procedure was tolerated well. Post Debridement Measurements: 1.5cm length x 1cm width x 0.1cm depth; 0.118cm^3 volume. Character of Wound/Ulcer Post Debridement is improved. Severity of Tissue Post Debridement is: Fat layer exposed. Post procedure Diagnosis Wound #1: Same as Pre-Procedure General Notes: Scribed for Dr. Lady Gary by Brenton Grills RN.Marland Kitchen Plan Follow-up Appointments: Return Appointment in 2 weeks. - Dr. Lady Gary RM 2 Anesthetic: Wound #1 Left Calcaneus: (In clinic) Topical Lidocaine 4% applied to wound bed Bathing/ Shower/ Hygiene: May shower and wash wound with soap and water. Edema Control - Lymphedema / SCD / Other: Avoid standing for long periods of time. Moisturize legs daily. Off-Loading: Other: - Please wear off-loading shoe on left foot to keep pressure off the heel The following medication(s) was prescribed: lidocaine topical 4 % cream cream topical was prescribed at facility WOUND #1: - Calcaneus Wound Laterality: Left Cleanser: Normal Saline (Generic) Every Other Day/30 Days Discharge Instructions: Cleanse the wound with Normal Saline prior to applying a clean dressing using gauze sponges, not tissue or cotton balls. Cleanser: Soap and Water Every Other Day/30 Days Discharge Instructions: May shower and wash wound with dial antibacterial soap and water prior to dressing change. Cleanser: Wound Cleanser Every Other  Day/30 Days Discharge Instructions: Cleanse the wound with wound cleanser prior to applying a clean dressing using gauze sponges, not tissue or cotton balls. Topical: Skintegrity Hydrogel 4 (oz) Every Other Day/30 Days Discharge Instructions: Apply hydrogel as directed Prim Dressing: Endoform 2x2 in Every Other Day/30 Days ary Discharge Instructions: Moisten with saline Secondary Dressing: Woven Gauze Sponge, Non-Sterile 4x4 in (Generic) Every Other Day/30 Days Discharge Instructions: Apply over primary dressing as directed. Secured With: [employer], 4.5x3.1 (in/yd) (Generic) Every Other Day/30 Days Discharge Instructions: Secure with Kerlix as directed. Secured With: 50M Medipore Scientist, research (life sciences) Surgical T 2x10 (in/yd) (Generic) Every Other Day/30 Days ape Discharge Instructions: Secure with tape as directed. 01/23/2023: Much of the wound has epithelialized. There are a couple of small open areas with beefy granulation tissue present. I used a curette to debride slough from the open portions of her wound. We have used her 5 TheraSkin applications. We  will apply endoform forward. Continue to offload and maintain adequate protein intake. Follow-up in 2 weeks. Electronic Signature(s) Signed: 01/23/2023 1:50:58 PM By: Duanne Guess MD FACS Entered By: Duanne Guess on 01/23/2023 13:50:58 -------------------------------------------------------------------------------- HxROS Details Patient Name: Date of Service: Destiny Kindred F. 01/23/2023 1:15 PM Medical Record Number: [id number] Patient Account Number: [id number] Date of Birth/Sex: Treating RN: 1946-05-30 (77 y.o. F) Primary Care Provider: Merri Brunette Other Clinician: Referring Provider: Treating Provider/Extender: Willey Blade in Treatment: 35 Information Obtained From Patient Eyes Medical History: Past Medical History Notes: Macular Degeneration Destiny [name], Destiny [name] ([id number])  [id number] Page 8 of 9 Ear/Nose/Mouth/Throat Medical History: Past Medical History Notes: Tinnitus Endocrine Medical History: Positive for: Type II Diabetes - Metformin pillsand Ozempic Time with diabetes: 25 years Treated with: Oral agents Blood sugar tested every day: Yes Tested : every day Musculoskeletal Medical History: Positive for: Osteoarthritis Oncologic Medical History: Past Medical History Notes: 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 has had Chemotherapy (stopped) and Radiation -8 treatments left as of 05/19/22 Rectal cancer Psychiatric Medical History: Past Medical History Notes: Hx Depression Immunizations Pneumococcal Vaccine: Received Pneumococcal Vaccination: Yes Received Pneumococcal Vaccination On or After 60th Birthday: Yes Implantable Devices None Hospitalization / Surgery History Type of Hospitalization/Surgery Melanoma excision to left heel;ORIF Left ankle fracture; rectal surgery Family and Social History Former smoker - 1991; Marital Status - Divorced; Alcohol Use: Never; Drug Use: No History; Caffeine Use: Moderate - Diet coke,coffee; Financial Concerns: No; Food, Clothing or Shelter Needs: No; Support System Lacking: No; Transportation Concerns: No Electronic Signature(s) Signed: 01/23/2023 2:25:12 PM By: Duanne Guess MD FACS Entered By: Duanne Guess on 01/23/2023 13:49:32 -------------------------------------------------------------------------------- SuperBill Details Patient Name: Date of Service: Destiny [name] 01/23/2023 Medical Record Number: [id number] Patient Account Number: [id number] Date of Birth/Sex: Treating RN: September 18, 1946 (77 y.o. Gevena Mart Primary Care Provider: Merri Brunette Other Clinician: Referring Provider: Treating Provider/Extender: Willey Blade in Treatment: 35 Diagnosis Coding ICD-10 Codes Code Description [phone] Non-pressure chronic ulcer of left heel and  midfoot with fat layer exposed T81.31XS Disruption of external operation (surgical) wound, not elsewhere classified, sequela C43.9 Malignant melanoma of skin, unspecified C20 Malignant neoplasm of rectum Destiny [name], Destiny [name] ([id number]) [id number] Page 9 of 9 E11.9 Type 2 diabetes mellitus without complications Z92.21 Personal history of antineoplastic chemotherapy Z92.3 Personal history of irradiation Facility Procedures : CPT4 Code: 41324401 Description: 99213 - WOUND CARE VISIT-LEV 3 EST PT Modifier: 25 Quantity: 1 : CPT4 Code: [id number] Description: 97597 - DEBRIDE WOUND 1ST 20 SQ CM OR < ICD-10 Diagnosis Description L97.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 Modifier: Quantity: 1 Physician Procedures : CPT4 Code Description Modifier [id number] 99213 - WC PHYS LEVEL 3 - EST PT 25 ICD-10 Diagnosis Description L97.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 T81.31XS Disruption of external operation (surgical) wound, not  elsewhere classified, sequela C43.9 Malignant melanoma of skin, unspecified Z92.21 Personal history of antineoplastic chemotherapy Quantity: 1 : [id number] 97597 - WC PHYS DEBR WO ANESTH 20 SQ CM ICD-10 Diagnosis Description L97.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 Quantity: 1 Electronic Signature(s) Signed: 01/23/2023 1:51:20 PM By: Duanne Guess MD FACS Entered By: Duanne Guess on 01/23/2023 13:51:19

## 2023-02-06 ENCOUNTER — Encounter (HOSPITAL_BASED_OUTPATIENT_CLINIC_OR_DEPARTMENT_OTHER): Payer: PPO | Admitting: General Surgery

## 2023-02-06 DIAGNOSIS — L97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: Secondary | ICD-10-CM | POA: Diagnosis not present

## 2023-02-06 DIAGNOSIS — T8131XA Disruption of external operation (surgical) wound, not elsewhere classified, initial encounter: Secondary | ICD-10-CM | POA: Diagnosis not present

## 2023-02-06 DIAGNOSIS — E11621 Type 2 diabetes mellitus with foot ulcer: Secondary | ICD-10-CM | POA: Diagnosis not present

## 2023-02-07 NOTE — Progress Notes (Addendum)
SHA, BURLING ([id number]) [id number] Page 1 of 7 Visit Report for 02/06/2023 Arrival Information Details Patient Name: Date of Service: Destiny [name], Destiny [name] 02/06/2023 2:45 PM Medical Record Number: [id number] Patient Account Number: [id number] Date of Birth/Sex: Treating RN: [birth year] (77 y.o. Caro Hight, Ladona Ridgel Primary Care Nimra Puccinelli: Merri Brunette Other Clinician: Referring Jaedynn Bohlken: Treating Nashae Maudlin/Extender: Willey Blade in Treatment: 37 Visit Information History Since Last Visit Added or deleted any medications: No Patient Arrived: Ambulatory Any new allergies or adverse reactions: No Arrival Time: 14:27 Had a fall or experienced change in No Accompanied By: self activities of daily living that may affect Transfer Assistance: None risk of falls: Patient Identification Verified: Yes Signs or symptoms of abuse/neglect since last visito No Secondary Verification Process Completed: Yes Hospitalized since last visit: No Patient Requires Transmission-Based Precautions: No Implantable device outside of the clinic excluding No Patient Has Alerts: No cellular tissue based products placed in the center since last visit: Has Dressing in Place as Prescribed: Yes Pain Present Now: No Electronic Signature(s) Signed: 02/06/2023 3:40:39 PM By: Samuella Bruin Entered By: Samuella Bruin on 02/06/2023 14:31:17 -------------------------------------------------------------------------------- Encounter Discharge Information Details Patient Name: Date of Service: Destiny [name] 02/06/2023 2:45 PM Medical Record Number: [id number] Patient Account Number: [id number] Date of Birth/Sex: Treating RN: November 04, 1945 (77 y.o. Fredderick Phenix Primary Care Kailene Steinhart: Merri Brunette Other Clinician: Referring Cane Dubray: Treating Mort Smelser/Extender: Willey Blade in Treatment: 37 Encounter Discharge Information  Items Post Procedure Vitals Discharge Condition: Stable Temperature (F): 98.1 Ambulatory Status: Ambulatory Pulse (bpm): 82 Discharge Destination: Home Respiratory Rate (breaths/min): 16 Transportation: Private Auto Blood Pressure (mmHg): 138/81 Accompanied By: self Schedule Follow-up Appointment: Yes Clinical Summary of Care: Patient Declined Electronic Signature(s) Signed: 02/06/2023 3:40:39 PM By: Samuella Bruin Entered By: Samuella Bruin on 02/06/2023 14:52:49 -------------------------------------------------------------------------------- Lower Extremity Assessment Details Patient Name: Date of Service: Destiny [name], Destiny [name] 02/06/2023 2:45 PM Medical Record Number: [id number] Patient Account Number: [id number] Date of Birth/Sex: Treating RN: 1946-04-02 (77 y.o. Fredderick Phenix Primary Care Dashawn Bartnick: Merri Brunette Other Clinician: Referring Shone Leventhal: Treating Deya Bigos/Extender: Willey Blade in Treatment: 37 Edema Assessment Assessed: Kyra Searles: No] Franne Forts: No] P[LeftMARCILLE, BARMAN ([id number])] [Right: [id number] Page 2 of 7] Edema: [Left: N] [Right: o] Calf Left: Right: Point of Measurement: 29 cm From Medial Instep 35 cm Ankle Left: Right: Point of Measurement: 10 cm From Medial Instep 21 cm Electronic Signature(s) Signed: 02/06/2023 3:40:39 PM By: Samuella Bruin Entered By: Samuella Bruin on 02/06/2023 14:31:42 -------------------------------------------------------------------------------- Multi Wound Chart Details Patient Name: Date of Service: Destiny [name] 02/06/2023 2:45 PM Medical Record Number: [id number] Patient Account Number: [id number] Date of Birth/Sex: Treating RN: December 15, 1945 (77 y.o. F) Primary Care Jacquelinne Speak: Merri Brunette Other Clinician: Referring Camilah Spillman: Treating Blu Mcglaun/Extender: Willey Blade in Treatment: 37 Vital Signs Height(in):  63 Pulse(bpm): 82 Weight(lbs): 182 Blood Pressure(mmHg): 138/81 Body Mass Index(BMI): 32.2 Temperature(F): 98.1 Respiratory Rate(breaths/min): 16 [1:Photos:] [N/A:N/A] Left Calcaneus N/A N/A Wound Location: Hematoma N/A N/A Wounding Event: Diabetic Wound/Ulcer of the Lower N/A N/A Primary Etiology: Extremity Type II Diabetes, Osteoarthritis N/A N/A Comorbid History: 05/05/2022 N/A N/A Date Acquired: 38 N/A N/A Weeks of Treatment: Open N/A N/A Wound Status: No N/A N/A Wound Recurrence: 2x1.7x0.1 N/A N/A Measurements L x W x D (cm) 2.67 N/A N/A A (cm) : rea 0.267 N/A N/A Volume (cm) : 60.80% N/A N/A % Reduction in A rea: 80.40% N/A N/A % Reduction in Volume: Grade 1 N/A N/A Classification:  Medium N/A N/A Exudate A mount: Serosanguineous N/A N/A Exudate Type: red, brown N/A N/A Exudate Color: Distinct, outline attached N/A N/A Wound Margin: Small (1-33%) N/A N/A Granulation A mount: Red N/A N/A Granulation Quality: Large (67-100%) N/A N/A Necrotic A mount: Eschar, Adherent Slough N/A N/A Necrotic Tissue: Fat Layer (Subcutaneous Tissue): Yes N/A N/A Exposed Structures: Fascia: No Tendon: No Muscle: No Joint: No Bone: No Medium (34-66%) N/A N/A Epithelialization: Debridement - Selective/Open Wound N/A N/A Debridement: Pre-procedure Verification/Time Out 14:38 N/A N/A TakenNOURA, PURPURA ([id number]) [id number] Page 3 of 7 Lidocaine 4% Topical Solution N/A N/A Pain Control: Necrotic/Eschar, Slough N/A N/A Tissue Debrided: Non-Viable Tissue N/A N/A Level: 2.8 N/A N/A Debridement A (sq cm): rea Curette N/A N/A Instrument: Minimum N/A N/A Bleeding: Pressure N/A N/A Hemostasis A chieved: Procedure was tolerated well N/A N/A Debridement Treatment Response: 2.1x1.7x0.1 N/A N/A Post Debridement Measurements L x W x D (cm) 0.28 N/A N/A Post Debridement Volume: (cm) Callus: Yes N/A N/A Periwound Skin  Texture: Dry/Scaly: Yes N/A N/A Periwound Skin Moisture: Maceration: No No Abnormalities Noted N/A N/A Periwound Skin Color: No Abnormality N/A N/A Temperature: Debridement N/A N/A Procedures Performed: Treatment Notes Wound #1 (Calcaneus) Wound Laterality: Left Cleanser Normal Saline Discharge Instruction: Cleanse the wound with Normal Saline prior to applying a clean dressing using gauze sponges, not tissue or cotton balls. Soap and Water Discharge Instruction: May shower and wash wound with dial antibacterial soap and water prior to dressing change. Wound Cleanser Discharge Instruction: Cleanse the wound with wound cleanser prior to applying a clean dressing using gauze sponges, not tissue or cotton balls. Peri-Wound Care Topical Primary Dressing Endoform 2x2 in Discharge Instruction: Moisten with saline Secondary Dressing Woven Gauze Sponge, Non-Sterile 4x4 in Discharge Instruction: Apply over primary dressing as directed. Secured With [employer], 4.5x3.1 (in/yd) Discharge Instruction: Secure with Kerlix as directed. 5M Medipore Soft Cloth Surgical T 2x10 (in/yd) ape Discharge Instruction: Secure with tape as directed. Compression Wrap Compression Stockings Add-Ons Electronic Signature(s) Signed: 02/06/2023 4:36:06 PM By: Duanne Guess MD FACS Entered By: Duanne Guess on 02/06/2023 16:36:06 -------------------------------------------------------------------------------- Multi-Disciplinary Care Plan Details Patient Name: Date of Service: Destiny [name] 02/06/2023 2:45 PM Medical Record Number: [id number] Patient Account Number: [id number] Date of Birth/Sex: Treating RN: Apr 06, 1946 (77 y.o. Fredderick Phenix Primary Care Ocie Tino: Merri Brunette Other Clinician: Referring Izek Corvino: Treating Nhyira Leano/Extender: Willey Blade in Treatment: 37 Multidisciplinary Care Plan reviewed with physician Active Inactive DEAN, WONDER ([id number]) [id number] Page 4 of 7 Necrotic Tissue Nursing Diagnoses: Impaired tissue integrity related to necrotic/devitalized tissue Knowledge deficit related to management of necrotic/devitalized tissue Goals: Necrotic/devitalized tissue will be minimized in the wound bed Date Initiated: 08/08/2022 Target Resolution Date: 02/13/2023 Goal Status: Active Patient/caregiver will verbalize understanding of reason and process for debridement of necrotic tissue Date Initiated: 08/08/2022 Target Resolution Date: 02/13/2023 Goal Status: Active Interventions: Assess patient pain level pre-, during and post procedure and prior to discharge Provide education on necrotic tissue and debridement process Treatment Activities: Apply topical anesthetic as ordered : 08/08/2022 Notes: Electronic Signature(s) Signed: 02/06/2023 3:40:39 PM By: Samuella Bruin Entered By: Samuella Bruin on 02/06/2023 14:42:27 -------------------------------------------------------------------------------- Pain Assessment Details Patient Name: Date of Service: Destiny [name] 02/06/2023 2:45 PM Medical Record Number: [id number] Patient Account Number: [id number] Date of Birth/Sex: Treating RN: [birth year] (77 y.o. Fredderick Phenix Primary Care Magdalen Cabana: Merri Brunette Other Clinician: Referring Kordelia Severin: Treating Kendall Justo/Extender: Willey Blade in Treatment: 62 Active Problems Location  of Pain Severity and Description of Pain Patient Has Paino No Site Locations Rate the pain. Current Pain Level: 0 Pain Management and Medication Current Pain Management: Electronic Signature(s) Signed: 02/06/2023 3:40:39 PM By: Samuella Bruin Entered By: Samuella Bruin on 02/06/2023 14:31:37 Keddy, Lenon Ahmadi ([id number]) [id number] Page 5 of  7 -------------------------------------------------------------------------------- Patient/Caregiver Education Details Patient Name: Date of Service: SYBILLA, MALHOTRA 4/26/2024andnbsp2:45 PM Medical Record Number: [id number] Patient Account Number: [id number] Date of Birth/Gender: Treating RN: Feb 04, 1946 (77 y.o. Fredderick Phenix Primary Care Physician: Merri Brunette Other Clinician: Referring Physician: Treating Physician/Extender: Willey Blade in Treatment: 37 Education Assessment Education Provided To: Patient Education Topics Provided Wound/Skin Impairment: Methods: Explain/Verbal Responses: Reinforcements needed, State content correctly Electronic Signature(s) Signed: 02/06/2023 3:40:39 PM By: Samuella Bruin Entered By: Samuella Bruin on 02/06/2023 14:42:38 -------------------------------------------------------------------------------- Wound Assessment Details Patient Name: Date of Service: Destiny [name] 02/06/2023 2:45 PM Medical Record Number: [id number] Patient Account Number: [id number] Date of Birth/Sex: Treating RN: [birth year] (77 y.o. Fredderick Phenix Primary Care Juell Radney: Merri Brunette Other Clinician: Referring Markiesha Delia: Treating Rashaunda Rahl/Extender: Willey Blade in Treatment: 37 Wound Status Wound Number: 1 Primary Etiology: Diabetic Wound/Ulcer of the Lower Extremity Wound Location: Left Calcaneus Wound Status: Open Wounding Event: Hematoma Comorbid History: Type II Diabetes, Osteoarthritis Date Acquired: 05/05/2022 Weeks Of Treatment: 37 Clustered Wound: No Photos Wound Measurements Length: (cm) 2 Width: (cm) 1.7 Depth: (cm) 0.1 Area: (cm) 2.67 Volume: (cm) 0.267 % Reduction in Area: 60.8% % Reduction in Volume: 80.4% Epithelialization: Medium (34-66%) Tunneling: No Undermining: No Wound Description Classification: Grade 1 Wound Margin: Distinct, outline attache Exudate  Amount: Medium Exudate Type: Serosanguineous Exudate Color: red, brown Maker, Treonna F ([id number]) Foul Odor After Cleansing: No d Slough/Fibrino Yes [id number] Page 6 of 7 Wound Bed Granulation Amount: Small (1-33%) Exposed Structure Granulation Quality: Red Fascia Exposed: No Necrotic Amount: Large (67-100%) Fat Layer (Subcutaneous Tissue) Exposed: Yes Necrotic Quality: Eschar, Adherent Slough Tendon Exposed: No Muscle Exposed: No Joint Exposed: No Bone Exposed: No Periwound Skin Texture Texture Color No Abnormalities Noted: No No Abnormalities Noted: Yes Callus: Yes Temperature / Pain Temperature: No Abnormality Moisture No Abnormalities Noted: Yes Treatment Notes Wound #1 (Calcaneus) Wound Laterality: Left Cleanser Normal Saline Discharge Instruction: Cleanse the wound with Normal Saline prior to applying a clean dressing using gauze sponges, not tissue or cotton balls. Soap and Water Discharge Instruction: May shower and wash wound with dial antibacterial soap and water prior to dressing change. Wound Cleanser Discharge Instruction: Cleanse the wound with wound cleanser prior to applying a clean dressing using gauze sponges, not tissue or cotton balls. Peri-Wound Care Topical Primary Dressing Endoform 2x2 in Discharge Instruction: Moisten with saline Secondary Dressing Woven Gauze Sponge, Non-Sterile 4x4 in Discharge Instruction: Apply over primary dressing as directed. Secured With [employer], 4.5x3.1 (in/yd) Discharge Instruction: Secure with Kerlix as directed. 63M Medipore Soft Cloth Surgical T 2x10 (in/yd) ape Discharge Instruction: Secure with tape as directed. Compression Wrap Compression Stockings Add-Ons Electronic Signature(s) Signed: 02/06/2023 3:40:39 PM By: Samuella Bruin Entered By: Samuella Bruin on 02/06/2023  14:36:23 -------------------------------------------------------------------------------- Vitals Details Patient Name: Date of Service: Destiny [name] 02/06/2023 2:45 PM Medical Record Number: [id number] Patient Account Number: [id number] Date of Birth/Sex: Treating RN: Dec 07, 1945 (77 y.o. Fredderick Phenix Primary Care Chandel Zaun: Merri Brunette Other Clinician: Referring Franny Selvage: Treating Leotta Weingarten/Extender: Willey Blade in Treatment: 37 Vital Signs Time Taken: 14:31 Temperature (F): 98.1 Height (in): 63 Pulse (bpm): 82 Weight (  lbs): 182 Respiratory Rate (breaths/min): 16 Thueson, Kali F ([id number]) [id number] Page 7 of 7 Body Mass Index (BMI): 32.2 Blood Pressure (mmHg): 138/81 Reference Range: 80 - 120 mg / dl Electronic Signature(s) Signed: 02/06/2023 3:40:39 PM By: Samuella Bruin Entered By: Samuella Bruin on 02/06/2023 14:31:30

## 2023-02-07 NOTE — Progress Notes (Signed)
Destiny, [name] ([id number]) [id number] Page 1 of 10 Visit Report for 02/06/2023 Chief Complaint Document Details Patient Name: Date of Service: Destiny [name], Destiny [name] 02/06/2023 2:45 PM Medical Record Number: [id number] Patient Account Number: [id number] Date of Birth/Sex: Treating RN: 1946/05/20 (77 y.o. F) Primary Care Provider: Merri Brunette Other Clinician: Referring Provider: Treating Provider/Extender: Willey Blade in Treatment: 37 Information Obtained from: Patient Chief Complaint Patient presents to the wound care center with open non-healing surgical wound(s) Electronic Signature(s) Signed: 02/06/2023 4:37:35 PM By: Duanne Guess MD FACS Entered By: Duanne Guess on 02/06/2023 16:37:35 -------------------------------------------------------------------------------- Debridement Details Patient Name: Date of Service: Destiny [name] 02/06/2023 2:45 PM Medical Record Number: [id number] Patient Account Number: [id number] Date of Birth/Sex: Treating RN: [birth year] (77 y.o. Destiny [name] Primary Care Provider: Merri Brunette Other Clinician: Referring Provider: Treating Provider/Extender: Willey Blade in Treatment: 37 Debridement Performed for Assessment: Wound #1 Left Calcaneus Performed By: Physician Duanne Guess, MD Debridement Type: Debridement Severity of Tissue Pre Debridement: Fat layer exposed Level of Consciousness (Pre-procedure): Awake and Alert Pre-procedure Verification/Time Out Yes - 14:38 Taken: Start Time: 14:38 Pain Control: Lidocaine 4% Topical Solution Percent of Wound Bed Debrided: 100% T Area Debrided (cm): otal 2.8 Tissue and other material debrided: Non-Viable, Eschar, Slough, Slough Level: Non-Viable Tissue Debridement Description: Selective/Open Wound Instrument: Curette Bleeding: Minimum Hemostasis Achieved: Pressure Response to Treatment: Procedure  was tolerated well Level of Consciousness (Post- Awake and Alert procedure): Post Debridement Measurements of Total Wound Length: (cm) 2.1 Width: (cm) 1.7 Depth: (cm) 0.1 Volume: (cm) 0.28 Character of Wound/Ulcer Post Debridement: Improved Severity of Tissue Post Debridement: Fat layer exposed Post Procedure Diagnosis Same as Pre-procedure Notes scribed for Dr. Lady Gary by Samuella Bruin, RN Electronic Signature(s) Signed: 02/06/2023 3:40:39 PM By: Samuella Bruin Signed: 02/06/2023 4:46:59 PM By: Duanne Guess MD FACS Destiny [name] ([id number]) [id number] Page 2 of 10 Entered By: Samuella Bruin on 02/06/2023 14:42:57 -------------------------------------------------------------------------------- HPI Details Patient Name: Date of Service: Destiny, [name] 02/06/2023 2:45 PM Medical Record Number: [id number] Patient Account Number: [id number] Date of Birth/Sex: Treating RN: 20-Jun-1946 (77 y.o. 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 After the final reexcision, the wound had an ACell graft applied. She reports that she had good closure of skin over the site. Earlier this year, at the end of May, however, she was diagnosed with rectal cancer and is currently undergoing neoadjuvant therapy (chemotherapy and radiation) in anticipation of future surgery. She reports that about 2 weeks ago, she noted that her skin coverage over the heel was beginning to break down. Her oncologist felt that perhaps the Xeloda was contributing to wound failure and stopped this medication. She continues to receive neoadjuvant radiation therapy, however. She saw plastic  surgery last week, and they referred her to the wound care center for further evaluation and management. She is a type II diabetic, well controlled, with the most recent A1c available to me being 5.9%. ABIs done 1 year ago were normal. On the patient's left heel, there is an irregular wound with some skin maceration. The fat layer is exposed and there is slough over the entire surface. Outside of the area of maceration, the skin is in good condition. No erythema, induration, or odor. No concern  for infection. 05/26/2022: The wound is smaller today and is extremely clean, without any slough buildup. The periwound skin is in much better condition and is no longer macerated. She is scheduled to complete her radiation treatment on Wednesday. 06/03/2022: The wound continues to contract. There is just a little bit of eschar and callus accumulation. The surface is clean. She has completed all of her neoadjuvant therapy and is awaiting surgery in November. 06/10/2022: The wound is smaller by about a centimeter in all dimensions. She does have a little bit of eschar near the Achilles portion of the wound. The wound surface itself is robust and healthy-appearing. 06/24/2022: The wound continues to contract. There is just a small portion open in the center. She does have some dry skin and periwound callus circumferentially. No concern for infection. 07/08/2022: The wound is down to just a pinhole. There is a little bit of eschar on the surface. No concern for infection. 07/22/2022: Her wound is healed. 08/08/2022: Unfortunately, it seems that when her wound was being dressed at her last visit, the nurse applying the dressing pulled some dead skin off and reopened to the wound. I am not sure why this was not brought to my attention, but the patient has been caring for the wound at home with her supplies from her previous admission. She has been wearing her heel off loader and using Hydrofera Blue. There is some  eschar accumulation, but no erythema, induration, or other concern for infection. Her colorectal surgery is scheduled for next Wednesday. 09/03/2022: Ms. Kaman returns after undergoing her colon surgery. She has been dressing the wound at home with [facility], but has not been wearing her heel cup. The wound is more open today with a lot of periwound callus, dry skin, and a light layer of slough. No overt signs of infection. 09/17/2022: The wound is little bit smaller today. There is some periwound tissue maceration. She says she has been wearing her heel cup more regularly. 10/01/2022: The wound looks a little smaller in terms of the open dimensions. There is more fresh epithelium present. The periwound skin is actually a bit dry and cracked with peeling skin. 10/16/2022: The periwound skin is extremely dry, thickened, and cracked. The wound is about the same size. There is slough on the wound surface. 10/30/2022: The periwound skin is dry but not nearly as thickened or cracked. The wound is a little bit smaller and more superficial. Minimal slough accumulation. She apparently was unable to get the endoform from the DME supplier so when she ran out of the leftovers from her clinic visit, she went back to using [facility]. 11/13/2022: There is a lot of dry skin around the wound. Otherwise the surface is clean. She has been approved for [employer]. 11/27/2022: The wound surface has robust granulation tissue and there is epithelium creeping around the perimeter. 12/11/2022: There is more epithelium filling in around the wound perimeter. 3/15; left calcaneus wounds that we have been placing TheraSkin on. 2 small wounds divided by epithelization. 01/08/2023: There has been more epithelialization of the wound. There is some dry eschar on the surface of the larger more distal aspect and around the edges. 01/23/2023: Much of the wound has epithelialized. There are a couple of small open areas with beefy  granulation tissue present. 02/06/2023: The silver collagen she has been using is sticking to the wound and its removal has torn away some of the epithelialized tissue, leaving the wound a bit bigger. Electronic Signature(s) Signed:  02/06/2023 4:40:48 PM By: Duanne Guess MD FACS Entered By: Duanne Guess on 02/06/2023 16:40:47 Fass, Lenon Ahmadi ([id number]) [id number] Page 3 of 10 -------------------------------------------------------------------------------- Physical Exam Details Patient Name: Date of Service: OLIVIYA, GILKISON 02/06/2023 2:45 PM Medical Record Number: [id number] Patient Account Number: [id number] Date of Birth/Sex: Treating RN: 1945/12/19 (77 y.o. F) Primary Care Provider: Merri Brunette Other Clinician: Referring Provider: Treating Provider/Extender: Willey Blade in Treatment: 37 Constitutional . . . . no acute distress. Respiratory Normal work of breathing on room air. Notes 02/06/2023: The silver collagen has pulled some of the thin epithelialized tissue off when it is being removed and so the wound is larger. The surface has healthy-looking granulation tissue. No concern for infection. Electronic Signature(s) Signed: 02/06/2023 4:41:55 PM By: Duanne Guess MD FACS Entered By: Duanne Guess on 02/06/2023 16:41:54 -------------------------------------------------------------------------------- Physician Orders Details Patient Name: Date of Service: Destiny [name] 02/06/2023 2:45 PM Medical Record Number: [id number] Patient Account Number: [id number] Date of Birth/Sex: Treating RN: 22-Jul-1946 (77 y.o. Destiny [name] Primary Care Provider: Merri Brunette Other Clinician: Referring Provider: Treating Provider/Extender: Willey Blade in Treatment: [id number] Verbal / Phone Orders: No Diagnosis Coding ICD-10 Coding Code Description [id number] Non-pressure chronic ulcer of left  heel and midfoot with fat layer exposed T81.31XS Disruption of external operation (surgical) wound, not elsewhere classified, sequela C43.9 Malignant melanoma of skin, unspecified C20 Malignant neoplasm of rectum E11.9 Type 2 diabetes mellitus without complications Z92.21 Personal history of antineoplastic chemotherapy Z92.3 Personal history of irradiation Follow-up Appointments ppointment in 2 weeks. - Dr. Lady Gary RM 2 Return A Anesthetic Wound #1 Left Calcaneus (In clinic) Topical Lidocaine 4% applied to wound bed Bathing/ Shower/ Hygiene May shower and wash wound with soap and water. Edema Control - Lymphedema / SCD / Other Avoid standing for long periods of time. Moisturize legs daily. Off-Loading Other: - Please wear off-loading shoe on left foot to keep pressure off the heel Wound Treatment Wound #1 - Calcaneus Wound Laterality: Left Cleanser: Normal Saline (Generic) Every Other Day/30 Days Discharge Instructions: Cleanse the wound with Normal Saline prior to applying a clean dressing using gauze sponges, not tissue or cotton balls. Cleanser: Soap and Water Every Other Day/30 Days Destiny [name], Destiny [name] ([id number]) [id number] Page 4 of 10 Discharge Instructions: May shower and wash wound with dial antibacterial soap and water prior to dressing change. Cleanser: Wound Cleanser Every Other Day/30 Days Discharge Instructions: Cleanse the wound with wound cleanser prior to applying a clean dressing using gauze sponges, not tissue or cotton balls. Prim Dressing: Endoform 2x2 in Every Other Day/30 Days ary Discharge Instructions: Moisten with saline Secondary Dressing: Woven Gauze Sponge, Non-Sterile 4x4 in (Generic) Every Other Day/30 Days Discharge Instructions: Apply over primary dressing as directed. Secured With: [employer], 4.5x3.1 (in/yd) (Generic) Every Other Day/30 Days Discharge Instructions: Secure with Kerlix as directed. Secured With:  13M [employer] Scientist, research (life sciences) Surgical T 2x10 (in/yd) (Generic) Every Other Day/30 Days ape Discharge Instructions: Secure with tape as directed. Consults Plastic Surgery - referral for consideration of skin graft for nonhealing wound to left heel ICD10: L97.422 Patient Medications llergies: propofol, aspirin, atorvastatin, Lisinopril, hydrocodone, penicillin A Notifications Medication Indication Start End 02/06/2023 lidocaine DOSE topical 4 % cream - cream topical Electronic Signature(s) Signed: 02/06/2023 4:46:59 PM By: Duanne Guess MD FACS Previous Signature: 02/06/2023 3:40:39 PM Version By: Samuella Bruin Entered By: Duanne Guess on 02/06/2023 16:42:22 Prescription 02/06/2023 -------------------------------------------------------------------------------- Destiny [name]. Duanne Guess MD Patient Name: Provider:  1946-09-04 [id number] Date of Birth: NPI#Destiny [name] [id number] Sex: DEA #: [phone] [id number] Phone #: License #: UPN: Patient Address: [phone] Eligha Bridegroom [facility] [place], [place] [address] [address] [place], [place] [address] [phone] Allergies propofol; aspirin; atorvastatin; Lisinopril; hydrocodone; penicillin Provider's Orders Plastic Surgery - referral for consideration of skin graft for nonhealing wound to left heel ICD10: L97.422 Hand Signature: Date(s): Electronic Signature(s) Signed: 02/06/2023 4:45:59 PM By: Duanne Guess MD FACS Previous Signature: 02/06/2023 3:40:39 PM Version By: Samuella Bruin Entered By: Duanne Guess on 02/06/2023 16:45:59 -------------------------------------------------------------------------------- Problem List Details Patient Name: Date of Service: Destiny [facility] F. 02/06/2023 2:45 PM Destiny [name] ([id number]) [id number] Page 5 of 10 Medical Record Number: [id number] Patient Account Number: [id number] Date of  Birth/Sex: Treating RN: [birth year] (76 y.o. F) Primary Care Provider: Merri Brunette Other Clinician: Referring Provider: Treating Provider/Extender: Willey Blade in Treatment: 37 Active Problems ICD-10 Encounter Code Description Active Date MDM Diagnosis L97.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 05/19/2022 No Yes T81.31XS Disruption of external operation (surgical) wound, not elsewhere classified, 05/19/2022 No Yes sequela C43.9 Malignant melanoma of skin, unspecified 05/19/2022 No Yes C20 Malignant neoplasm of rectum 05/19/2022 No Yes E11.9 Type 2 diabetes mellitus without complications 05/19/2022 No Yes Z92.21 Personal history of antineoplastic chemotherapy 05/19/2022 No Yes Z92.3 Personal history of irradiation 05/19/2022 No Yes Inactive Problems Resolved Problems Electronic Signature(s) Signed: 02/06/2023 4:32:32 PM By: Duanne Guess MD FACS Entered By: Duanne Guess on 02/06/2023 16:32:32 -------------------------------------------------------------------------------- Progress Note Details Patient Name: Date of Service: Destiny [name] 02/06/2023 2:45 PM Medical Record Number: [id number] Patient Account Number: [id number] Date of Birth/Sex: Treating RN: [birth year] (77 y.o. 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 After the final reexcision, the wound had an ACell graft applied. She reports that she had good closure of skin over the site. Earlier this year, at the end of May, however, she was  diagnosed with rectal cancer and is currently undergoing neoadjuvant therapy (chemotherapy and radiation) in anticipation of future surgery. She reports that about 2 weeks ago, she noted that her skin coverage over the heel was beginning to break down. Her oncologist felt that perhaps the Xeloda was contributing to wound failure and stopped this medication. She continues to receive neoadjuvant radiation therapy, however. She saw plastic surgery last week, and they referred her to the Destiny [name], Destiny [name] ([id number]) [id number] Page 6 of 10 wound care center for further evaluation and management. She is a type II diabetic, well controlled, with the most recent A1c available to me being 5.9%. ABIs done 1 year ago were normal. On the patient's left heel, there is an irregular wound with some skin maceration. The fat layer is exposed and there is slough over the entire surface. Outside of the area of maceration, the skin is in good condition. No erythema, induration, or odor. No concern for infection. 05/26/2022: The wound is smaller today and is extremely clean, without any slough buildup. The periwound skin is in much better condition and is no longer macerated. She is scheduled to complete her radiation  treatment on Wednesday. 06/03/2022: The wound continues to contract. There is just a little bit of eschar and callus accumulation. The surface is clean. She has completed all of her neoadjuvant therapy and is awaiting surgery in November. 06/10/2022: The wound is smaller by about a centimeter in all dimensions. She does have a little bit of eschar near the Achilles portion of the wound. The wound surface itself is robust and healthy-appearing. 06/24/2022: The wound continues to contract. There is just a small portion open in the center. She does have some dry skin and periwound callus circumferentially. No concern for infection. 07/08/2022: The wound is down to just a pinhole.  There is a little bit of eschar on the surface. No concern for infection. 07/22/2022: Her wound is healed. 08/08/2022: Unfortunately, it seems that when her wound was being dressed at her last visit, the nurse applying the dressing pulled some dead skin off and reopened to the wound. I am not sure why this was not brought to my attention, but the patient has been caring for the wound at home with her supplies from her previous admission. She has been wearing her heel off loader and using Hydrofera Blue. There is some eschar accumulation, but no erythema, induration, or other concern for infection. Her colorectal surgery is scheduled for next Wednesday. 09/03/2022: Ms. Chui returns after undergoing her colon surgery. She has been dressing the wound at home with [facility], but has not been wearing her heel cup. The wound is more open today with a lot of periwound callus, dry skin, and a light layer of slough. No overt signs of infection. 09/17/2022: The wound is little bit smaller today. There is some periwound tissue maceration. She says she has been wearing her heel cup more regularly. 10/01/2022: The wound looks a little smaller in terms of the open dimensions. There is more fresh epithelium present. The periwound skin is actually a bit dry and cracked with peeling skin. 10/16/2022: The periwound skin is extremely dry, thickened, and cracked. The wound is about the same size. There is slough on the wound surface. 10/30/2022: The periwound skin is dry but not nearly as thickened or cracked. The wound is a little bit smaller and more superficial. Minimal slough accumulation. She apparently was unable to get the endoform from the DME supplier so when she ran out of the leftovers from her clinic visit, she went back to using [facility]. 11/13/2022: There is a lot of dry skin around the wound. Otherwise the surface is clean. She has been approved for [employer]. 11/27/2022: The wound surface has  robust granulation tissue and there is epithelium creeping around the perimeter. 12/11/2022: There is more epithelium filling in around the wound perimeter. 3/15; left calcaneus wounds that we have been placing TheraSkin on. 2 small wounds divided by epithelization. 01/08/2023: There has been more epithelialization of the wound. There is some dry eschar on the surface of the larger more distal aspect and around the edges. 01/23/2023: Much of the wound has epithelialized. There are a couple of small open areas with beefy granulation tissue present. 02/06/2023: The silver collagen she has been using is sticking to the wound and its removal has torn away some of the epithelialized tissue, leaving the wound a bit bigger. Patient History Information obtained from Patient. Social History Former smoker - 1991, Marital Status - Divorced, Alcohol Use - Never, Drug Use - No History, Caffeine Use - Moderate - Diet coke,coffee. Medical History Endocrine Patient has history of  Type II Diabetes - Metformin pillsand Ozempic Musculoskeletal Patient has history of Osteoarthritis Hospitalization/Surgery History - Melanoma excision to left heel;ORIF Left ankle fracture;[facility] - rectal surgery. Medical A Surgical History Notes nd Eyes Macular Degeneration Ear/Nose/Mouth/Throat Tinnitus Oncologic 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 has had Chemotherapy (stopped) and Radiation -8 treatments left as of 05/19/22 Rectal cancer Psychiatric Hx Depression Objective Destiny [name], Destiny [name] ([id number]) [id number] Page 7 of 10 Constitutional no acute distress. Vitals Time Taken: 2:31 PM, Height: 63 in, Weight: 182 lbs, BMI: 32.2, Temperature: 98.1 F, Pulse: 82 bpm, Respiratory Rate: 16 breaths/min, Blood Pressure: 138/81 mmHg. Respiratory Normal work of breathing on room air. General Notes: 02/06/2023: The silver collagen has pulled some of the thin epithelialized tissue off when it is being removed and so the  wound is larger. The surface has healthy-looking granulation tissue. No concern for infection. Integumentary (Hair, Skin) Wound #1 status is Open. Original cause of wound was Hematoma. The date acquired was: 05/05/2022. The wound has been in treatment 37 weeks. The wound is located on the Left Calcaneus. The wound measures 2cm length x 1.7cm width x 0.1cm depth; 2.67cm^2 area and 0.267cm^3 volume. There is Fat Layer (Subcutaneous Tissue) exposed. There is no tunneling or undermining noted. There is a medium amount of serosanguineous drainage noted. The wound margin is distinct with the outline attached to the wound base. There is small (1-33%) red granulation within the wound bed. There is a large (67-100%) amount of necrotic tissue within the wound bed including Eschar and Adherent Slough. The periwound skin appearance had no abnormalities noted for moisture. The periwound skin appearance had no abnormalities noted for color. The periwound skin appearance exhibited: Callus. Periwound temperature was noted as No Abnormality. Assessment Active Problems ICD-10 Non-pressure chronic ulcer of left heel and midfoot with fat layer exposed Disruption of external operation (surgical) wound, not elsewhere classified, sequela Malignant melanoma of skin, unspecified Malignant neoplasm of rectum Type 2 diabetes mellitus without complications Personal history of antineoplastic chemotherapy Personal history of irradiation Procedures Wound #1 Pre-procedure diagnosis of Wound #1 is a Diabetic Wound/Ulcer of the Lower Extremity located on the Left Calcaneus .Severity of Tissue Pre Debridement is: Fat layer exposed. There was a Selective/Open Wound Non-Viable Tissue Debridement with a total area of 2.8 sq cm performed by Duanne Guess, MD. With the following instrument(s): Curette to remove Non-Viable tissue/material. Material removed includes Eschar and Slough and after achieving pain control using  Lidocaine 4% T opical Solution. No specimens were taken. A time out was conducted at 14:38, prior to the start of the procedure. A Minimum amount of bleeding was controlled with Pressure. The procedure was tolerated well. Post Debridement Measurements: 2.1cm length x 1.7cm width x 0.1cm depth; 0.28cm^3 volume. Character of Wound/Ulcer Post Debridement is improved. Severity of Tissue Post Debridement is: Fat layer exposed. Post procedure Diagnosis Wound #1: Same as Pre-Procedure General Notes: scribed for Dr. Lady Gary by Samuella Bruin, RN. Plan Follow-up Appointments: Return Appointment in 2 weeks. - Dr. Lady Gary RM 2 Anesthetic: Wound #1 Left Calcaneus: (In clinic) Topical Lidocaine 4% applied to wound bed Bathing/ Shower/ Hygiene: May shower and wash wound with soap and water. Edema Control - Lymphedema / SCD / Other: Avoid standing for long periods of time. Moisturize legs daily. Off-Loading: Other: - Please wear off-loading shoe on left foot to keep pressure off the heel Consults ordered were: Plastic Surgery - referral for consideration of skin graft for nonhealing wound to left heel ICD10: L97.422 The following medication(s) was prescribed: lidocaine  topical 4 % cream cream topical was prescribed at facility WOUND #1: - Calcaneus Wound Laterality: Left Cleanser: Normal Saline (Generic) Every Other Day/30 Days Discharge Instructions: Cleanse the wound with Normal Saline prior to applying a clean dressing using gauze sponges, not tissue or cotton balls. Cleanser: Soap and Water Every Other Day/30 Days Discharge Instructions: May shower and wash wound with dial antibacterial soap and water prior to dressing change. Cleanser: Wound Cleanser Every Other Day/30 Days Discharge Instructions: Cleanse the wound with wound cleanser prior to applying a clean dressing using gauze sponges, not tissue or cotton balls. Prim Dressing: Endoform 2x2 in Every Other Day/30 Days ary Destiny [name], Destiny [name]  ([id number]) [id number] Page 8 of 10 Discharge Instructions: Moisten with saline Secondary Dressing: Woven Gauze Sponge, Non-Sterile 4x4 in (Generic) Every Other Day/30 Days Discharge Instructions: Apply over primary dressing as directed. Secured With: [employer], 4.5x3.1 (in/yd) (Generic) Every Other Day/30 Days Discharge Instructions: Secure with Kerlix as directed. Secured With: 47M Medipore Scientist, research (life sciences) Surgical T 2x10 (in/yd) (Generic) Every Other Day/30 Days ape Discharge Instructions: Secure with tape as directed. 02/06/2023: The silver collagen has pulled some of the thin epithelialized tissue off when it is being removed and so the wound is larger. The surface has healthy-looking granulation tissue. No concern for infection. I used a curette to debride slough and eschar from the wound. I had thought we were using endoform on this wound, but she has been using silver collagen at home. I want to try endoform and she does report having a supply of this. I also think that it is time we place a referral to plastic surgery to see if a skin graft could be used for coverage, given the duration of this wound and limited closure. Follow-up in 2 weeks. Electronic Signature(s) Signed: 02/06/2023 4:45:27 PM By: Duanne Guess MD FACS Entered By: Duanne Guess on 02/06/2023 16:45:27 -------------------------------------------------------------------------------- HxROS Details Patient Name: Date of Service: Destiny Kindred F. 02/06/2023 2:45 PM Medical Record Number: [id number] Patient Account Number: [id number] Date of Birth/Sex: Treating RN: [birth year] (77 y.o. F) Primary Care Provider: Merri Brunette Other Clinician: Referring Provider: Treating Provider/Extender: Willey Blade in Treatment: 37 Information Obtained From Patient Eyes Medical History: Past Medical History Notes: Macular Degeneration Ear/Nose/Mouth/Throat Medical  History: Past Medical History Notes: Tinnitus Endocrine Medical History: Positive for: Type II Diabetes - Metformin pillsand Ozempic Time with diabetes: 25 years Treated with: Oral agents Blood sugar tested every day: Yes Tested : every day Musculoskeletal Medical History: Positive for: Osteoarthritis Oncologic Medical History: Past Medical History Notes: 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 has had Chemotherapy (stopped) and Radiation -8 treatments left as of 05/19/22 Rectal cancer Psychiatric Medical History: Past Medical History Notes: Hx Depression Immunizations Destiny [name], Destiny [name] ([id number]) [id number] Page 9 of 10 Pneumococcal Vaccine: Received Pneumococcal Vaccination: Yes Received Pneumococcal Vaccination On or After 60th Birthday: Yes Implantable Devices None Hospitalization / Surgery History Type of Hospitalization/Surgery Melanoma excision to left heel;ORIF Left ankle fracture; rectal surgery Family and Social History Former smoker - 1991; Marital Status - Divorced; Alcohol Use: Never; Drug Use: No History; Caffeine Use: Moderate - Diet coke,coffee; Financial Concerns: No; Food, Clothing or Shelter Needs: No; Support System Lacking: No; Transportation Concerns: No Electronic Signature(s) Signed: 02/06/2023 4:46:59 PM By: Duanne Guess MD FACS Entered By: Duanne Guess on 02/06/2023 16:40:55 -------------------------------------------------------------------------------- SuperBill Details Patient Name: Date of Service: Destiny [name] 02/06/2023 Medical Record Number: [id number] Patient Account Number: [id number] Date of Birth/Sex: Treating RN: 22-Sep-1946 (76  y.o. F) Primary Care Provider: Merri Brunette Other Clinician: Referring Provider: Treating Provider/Extender: Willey Blade in Treatment: 37 Diagnosis Coding ICD-10 Codes Code Description [phone] Non-pressure chronic ulcer of left heel and midfoot with fat  layer exposed T81.31XS Disruption of external operation (surgical) wound, not elsewhere classified, sequela C43.9 Malignant melanoma of skin, unspecified C20 Malignant neoplasm of rectum E11.9 Type 2 diabetes mellitus without complications Z92.21 Personal history of antineoplastic chemotherapy Z92.3 Personal history of irradiation Facility Procedures : CPT4 Code: [id number] Description: 97597 - DEBRIDE WOUND 1ST 20 SQ CM OR < ICD-10 Diagnosis Description L97.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 Modifier: Quantity: 1 Physician Procedures : CPT4 Code Description Modifier [id number] 99214 - WC PHYS LEVEL 4 - EST PT 25 ICD-10 Diagnosis Description L97.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

## 2023-02-10 DIAGNOSIS — E039 Hypothyroidism, unspecified: Secondary | ICD-10-CM | POA: Diagnosis not present

## 2023-02-10 DIAGNOSIS — M810 Age-related osteoporosis without current pathological fracture: Secondary | ICD-10-CM | POA: Diagnosis not present

## 2023-02-10 DIAGNOSIS — E1140 Type 2 diabetes mellitus with diabetic neuropathy, unspecified: Secondary | ICD-10-CM | POA: Diagnosis not present

## 2023-02-10 DIAGNOSIS — E559 Vitamin D deficiency, unspecified: Secondary | ICD-10-CM | POA: Diagnosis not present

## 2023-02-16 DIAGNOSIS — L97412 Non-pressure chronic ulcer of right heel and midfoot with fat layer exposed: Secondary | ICD-10-CM | POA: Diagnosis not present

## 2023-02-20 ENCOUNTER — Encounter (HOSPITAL_BASED_OUTPATIENT_CLINIC_OR_DEPARTMENT_OTHER): Payer: PPO | Attending: General Surgery | Admitting: General Surgery

## 2023-02-20 DIAGNOSIS — Y832 Surgical operation with anastomosis, bypass or graft as the cause of abnormal reaction of the patient, or of later complication, without mention of misadventure at the time of the procedure: Secondary | ICD-10-CM | POA: Diagnosis not present

## 2023-02-20 DIAGNOSIS — T8131XS Disruption of external operation (surgical) wound, not elsewhere classified, sequela: Secondary | ICD-10-CM | POA: Insufficient documentation

## 2023-02-20 DIAGNOSIS — Z87891 Personal history of nicotine dependence: Secondary | ICD-10-CM | POA: Diagnosis not present

## 2023-02-20 DIAGNOSIS — Z85820 Personal history of malignant melanoma of skin: Secondary | ICD-10-CM | POA: Insufficient documentation

## 2023-02-20 DIAGNOSIS — Z923 Personal history of irradiation: Secondary | ICD-10-CM | POA: Diagnosis not present

## 2023-02-20 DIAGNOSIS — C20 Malignant neoplasm of rectum: Secondary | ICD-10-CM | POA: Diagnosis not present

## 2023-02-20 DIAGNOSIS — E11621 Type 2 diabetes mellitus with foot ulcer: Secondary | ICD-10-CM | POA: Insufficient documentation

## 2023-02-20 DIAGNOSIS — L97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: Secondary | ICD-10-CM | POA: Diagnosis not present

## 2023-02-20 DIAGNOSIS — E119 Type 2 diabetes mellitus without complications: Secondary | ICD-10-CM | POA: Diagnosis not present

## 2023-02-20 DIAGNOSIS — Z9221 Personal history of antineoplastic chemotherapy: Secondary | ICD-10-CM | POA: Diagnosis not present

## 2023-02-21 NOTE — Progress Notes (Signed)
TAMELLA, ADDAMS ([id number]) [id number] Page 1 of 7 Visit Report for 02/20/2023 Arrival Information Details Patient Name: Date of Service: Destiny [name], Destiny [name] 02/20/2023 10:00 A M Medical Record Number: [id number] Patient Account Number: [id number] Date of Birth/Sex: Treating RN: 12-31-1945 (77 y.o. Destiny [name], Ladona Ridgel Primary Care Camiyah Friberg: Merri Brunette Other Clinician: Referring Venora Kautzman: Treating Gianfranco Araki/Extender: Willey Blade in Treatment: 39 Visit Information History Since Last Visit Added or deleted any medications: No Patient Arrived: Ambulatory Any new allergies or adverse reactions: No Arrival Time: 09:54 Had a fall or experienced change in No Accompanied By: self activities of daily living that may affect Transfer Assistance: None risk of falls: Patient Identification Verified: Yes Signs or symptoms of abuse/neglect since last visito No Secondary Verification Process Completed: Yes Hospitalized since last visit: No Patient Requires Transmission-Based Precautions: No Implantable device outside of the clinic excluding No Patient Has Alerts: No cellular tissue based products placed in the center since last visit: Has Dressing in Place as Prescribed: Yes Pain Present Now: No Electronic Signature(s) Signed: 02/20/2023 3:58:37 PM By: Destiny [name] Entered By: Destiny [name] on 02/20/2023 09:54:42 -------------------------------------------------------------------------------- Encounter Discharge Information Details Patient Name: Date of Service: Destiny [name]. 02/20/2023 10:00 A M Medical Record Number: [id number] Patient Account Number: [id number] Date of Birth/Sex: Treating RN: 1945/12/05 (77 y.o. Destiny [name] Primary Care Bralynn Velador: Merri Brunette Other Clinician: Referring Sarajean Dessert: Treating Paityn Balsam/Extender: Willey Blade in Treatment: 39 Encounter Discharge  Information Items Post Procedure Vitals Discharge Condition: Stable Temperature ([name]): 97.8 Ambulatory Status: Ambulatory Pulse (bpm): 98 Discharge Destination: Home Respiratory Rate (breaths/min): 16 Transportation: Private Auto Blood Pressure (mmHg): 129/77 Accompanied By: self Schedule Follow-up Appointment: Yes Clinical Summary of Care: Patient Declined Electronic Signature(s) Signed: 02/20/2023 3:58:37 PM By: Destiny [name] Entered By: Destiny [name] on 02/20/2023 10:21:39 -------------------------------------------------------------------------------- Lower Extremity Assessment Details Patient Name: Date of Service: Destiny [name]. 02/20/2023 10:00 A M Medical Record Number: [id number] Patient Account Number: [id number] Date of Birth/Sex: Treating RN: 16-Nov-1945 (77 y.o. Destiny [name] Primary Care Hortencia Martire: Merri Brunette Other Clinician: Referring Shiela Bruns: Treating Keymani Mclean/Extender: Willey Blade in Treatment: 39 Edema Assessment Assessed: Destiny [name]: No] Franne Forts: No] P[LeftJAYSE, Destiny [name] ([id number])] [Right: [id number] Page 2 of 7] Edema: [Left: N] [Right: o] Calf Left: Right: Point of Measurement: 29 cm From Medial Instep 35 cm Ankle Left: Right: Point of Measurement: 10 cm From Medial Instep 21 cm Electronic Signature(s) Signed: 02/20/2023 3:58:37 PM By: Destiny [name] Entered By: Destiny [name] on 02/20/2023 09:55:10 -------------------------------------------------------------------------------- Multi Wound Chart Details Patient Name: Date of Service: Destiny [name]. 02/20/2023 10:00 A M Medical Record Number: [id number] Patient Account Number: [id number] Date of Birth/Sex: Treating RN: 1946/05/13 (77 y.o. [name]) Primary Care Azaylia Fong: Merri Brunette Other Clinician: Referring Monie Shere: Treating Kiarra Kidd/Extender: Willey Blade in Treatment: 39 Vital  Signs Height(in): 63 Pulse(bpm): 98 Weight(lbs): 182 Blood Pressure(mmHg): 129/77 Body Mass Index(BMI): 32.2 Temperature([name]): 97.8 Respiratory Rate(breaths/min): 16 [1:Photos:] [N/A:N/A] Left Calcaneus N/A N/A Wound Location: Hematoma N/A N/A Wounding Event: Diabetic Wound/Ulcer of the Lower N/A N/A Primary Etiology: Extremity Type II Diabetes, Osteoarthritis N/A N/A Comorbid History: 05/05/2022 N/A N/A Date Acquired: 31 N/A N/A Weeks of Treatment: Open N/A N/A Wound Status: No N/A N/A Wound Recurrence: 0.7x1x0.1 N/A N/A Measurements L x W x D (cm) 0.55 N/A N/A A (cm) : rea 0.055 N/A N/A Volume (cm) : 91.90% N/A N/A % Reduction in A rea: 96.00% N/A N/A % Reduction in Volume: Grade  1 N/A N/A Classification: Medium N/A N/A Exudate A mount: Serosanguineous N/A N/A Exudate Type: red, brown N/A N/A Exudate Color: Distinct, outline attached N/A N/A Wound Margin: Large (67-100%) N/A N/A Granulation A mount: Red N/A N/A Granulation Quality: None Present (0%) N/A N/A Necrotic A mount: Fat Layer (Subcutaneous Tissue): Yes N/A N/A Exposed Structures: Fascia: No Tendon: No Muscle: No Joint: No Bone: No Medium (34-66%) N/A N/A Epithelialization: Debridement - Selective/Open Wound N/A N/A Debridement: Pre-procedure Verification/Time Out 10:07 N/A N/A Taken: Destiny [name] ([id number]) [id number] Page 3 of 7 Lidocaine 4% Topical Solution N/A N/A Pain Control: Callus N/A N/A Tissue Debrided: Skin/Epidermis N/A N/A Level: 0.55 N/A N/A Debridement A (sq cm): rea Curette N/A N/A Instrument: Minimum N/A N/A Bleeding: Pressure N/A N/A Hemostasis A chieved: Procedure was tolerated well N/A N/A Debridement Treatment Response: 0.7x1x0.1 N/A N/A Post Debridement Measurements L x W x D (cm) 0.055 N/A N/A Post Debridement Volume: (cm) Callus: Yes N/A N/A Periwound Skin Texture: Dry/Scaly: Yes N/A N/A Periwound Skin  Moisture: Maceration: No No Abnormalities Noted N/A N/A Periwound Skin Color: No Abnormality N/A N/A Temperature: Debridement N/A N/A Procedures Performed: Treatment Notes Wound #1 (Calcaneus) Wound Laterality: Left Cleanser Normal Saline Discharge Instruction: Cleanse the wound with Normal Saline prior to applying a clean dressing using gauze sponges, not tissue or cotton balls. Soap and Water Discharge Instruction: May shower and wash wound with dial antibacterial soap and water prior to dressing change. Wound Cleanser Discharge Instruction: Cleanse the wound with wound cleanser prior to applying a clean dressing using gauze sponges, not tissue or cotton balls. Peri-Wound Care Topical Primary Dressing Endoform 2x2 in Discharge Instruction: Moisten with saline Secondary Dressing Woven Gauze Sponge, Non-Sterile 4x4 in Discharge Instruction: Apply over primary dressing as directed. Secured With [employer], 4.5x3.1 (in/yd) Discharge Instruction: Secure with Kerlix as directed. 26M Medipore Soft Cloth Surgical T 2x10 (in/yd) ape Discharge Instruction: Secure with tape as directed. Compression Wrap Compression Stockings Add-Ons Electronic Signature(s) Signed: 02/20/2023 10:20:49 AM By: Destiny Guess MD FACS Entered By: Destiny [name] on 02/20/2023 10:20:49 -------------------------------------------------------------------------------- Multi-Disciplinary Care Plan Details Patient Name: Date of Service: Destiny [name]. 02/20/2023 10:00 A M Medical Record Number: [id number] Patient Account Number: [id number] Date of Birth/Sex: Treating RN: 06-Jun-1946 (77 y.o. Destiny [name] Primary Care Azaiah Mello: Merri Brunette Other Clinician: Referring Sabri Teal: Treating Gloriana Piltz/Extender: Willey Blade in Treatment: [address] Multidisciplinary Care Plan reviewed with physician [address] Destiny [name], Destiny [name] ([id number])  [id number] Page 4 of 7 Necrotic Tissue Nursing Diagnoses: Impaired tissue integrity related to necrotic/devitalized tissue Knowledge deficit related to management of necrotic/devitalized tissue Goals: Necrotic/devitalized tissue will be minimized in the wound bed Date Initiated: 08/08/2022 Target Resolution Date: 04/10/2023 Goal Status: Active Patient/caregiver will verbalize understanding of reason and process for debridement of necrotic tissue Date Initiated: 08/08/2022 Target Resolution Date: 04/10/2023 Goal Status: Active Interventions: Assess patient pain level pre-, during and post procedure and prior to discharge Provide education on necrotic tissue and debridement process Treatment Activities: Apply topical anesthetic as ordered : 08/08/2022 Notes: Electronic Signature(s) Signed: 02/20/2023 3:58:37 PM By: Destiny [name] Entered By: Destiny [name] on 02/20/2023 09:59:36 -------------------------------------------------------------------------------- Pain Assessment Details Patient Name: Date of Service: Destiny [name]. 02/20/2023 10:00 A M Medical Record Number: [id number] Patient Account Number: [id number] Date of Birth/Sex: Treating RN: [birth year] (77 y.o. Destiny [name] Primary Care Mita Vallo: Merri Brunette Other Clinician: Referring Orli Degrave: Treating Michaela Shankel/Extender: Willey Blade in Treatment: 39 Active Problems Location of Pain  Severity and Description of Pain Patient Has Paino No Site Locations Rate the pain. Current Pain Level: 0 Pain Management and Medication Current Pain Management: Electronic Signature(s) Signed: 02/20/2023 3:58:37 PM By: Destiny [name] Entered By: Destiny [name] on 02/20/2023 09:55:02 Destiny [name], Destiny Ahmadi ([id number]) [id number] Page 5 of 7 -------------------------------------------------------------------------------- Patient/Caregiver  Education Details Patient Name: Date of Service: Destiny [name], Destiny TTIE [name]. 5/10/2024andnbsp10:00 A M Medical Record Number: [id number] Patient Account Number: [id number] Date of Birth/Gender: Treating RN: Jul 05, 1946 (77 y.o. Destiny [name] Primary Care Physician: Merri Brunette Other Clinician: Referring Physician: Treating Physician/Extender: Willey Blade in Treatment: 39 Education Assessment Education Provided To: Patient Education Topics Provided Pressure: Methods: Explain/Verbal Responses: Reinforcements needed, State content correctly Electronic Signature(s) Signed: 02/20/2023 3:58:37 PM By: Destiny [name] Entered By: Destiny [name] on 02/20/2023 10:00:00 -------------------------------------------------------------------------------- Wound Assessment Details Patient Name: Date of Service: Destiny [name]. 02/20/2023 10:00 A M Medical Record Number: [id number] Patient Account Number: [id number] Date of Birth/Sex: Treating RN: [birth year] (77 y.o. Destiny [name] Primary Care Lindee Leason: Merri Brunette Other Clinician: Referring Diavion Labrador: Treating Cristine Daw/Extender: Willey Blade in Treatment: 39 Wound Status Wound Number: 1 Primary Etiology: Diabetic Wound/Ulcer of the Lower Extremity Wound Location: Left Calcaneus Wound Status: Open Wounding Event: Hematoma Comorbid History: Type II Diabetes, Osteoarthritis Date Acquired: 05/05/2022 Weeks Of Treatment: 39 Clustered Wound: No Photos Wound Measurements Length: (cm) 0.7 Width: (cm) 1 Depth: (cm) 0.1 Area: (cm) 0.55 Volume: (cm) 0.055 % Reduction in Area: 91.9% % Reduction in Volume: 96% Epithelialization: Medium (34-66%) Tunneling: No Undermining: No Wound Description Classification: Grade 1 Wound Margin: Distinct, outline attache Exudate Amount: Medium Exudate Type: Serosanguineous Exudate Color: red, brown Destiny [name], Destiny [name] ([id number]) Foul Odor  After Cleansing: No d Slough/Fibrino No [phone] Page 6 of 7 Wound Bed Granulation Amount: Large (67-100%) Exposed Structure Granulation Quality: Red Fascia Exposed: No Necrotic Amount: None Present (0%) Fat Layer (Subcutaneous Tissue) Exposed: Yes Tendon Exposed: No Muscle Exposed: No Joint Exposed: No Bone Exposed: No Periwound Skin Texture Texture Color No Abnormalities Noted: No No Abnormalities Noted: Yes Callus: Yes Temperature / Pain Temperature: No Abnormality Moisture No Abnormalities Noted: Yes Treatment Notes Wound #1 (Calcaneus) Wound Laterality: Left Cleanser Normal Saline Discharge Instruction: Cleanse the wound with Normal Saline prior to applying a clean dressing using gauze sponges, not tissue or cotton balls. Soap and Water Discharge Instruction: May shower and wash wound with dial antibacterial soap and water prior to dressing change. Wound Cleanser Discharge Instruction: Cleanse the wound with wound cleanser prior to applying a clean dressing using gauze sponges, not tissue or cotton balls. Peri-Wound Care Topical Primary Dressing Endoform 2x2 in Discharge Instruction: Moisten with saline Secondary Dressing Woven Gauze Sponge, Non-Sterile 4x4 in Discharge Instruction: Apply over primary dressing as directed. Secured With [employer], 4.5x3.1 (in/yd) Discharge Instruction: Secure with Kerlix as directed. 27M Medipore Soft Cloth Surgical T 2x10 (in/yd) ape Discharge Instruction: Secure with tape as directed. Compression Wrap Compression Stockings Add-Ons Electronic Signature(s) Signed: 02/20/2023 3:58:37 PM By: Destiny [name] Entered By: Destiny [name] on 02/20/2023 10:11:36 -------------------------------------------------------------------------------- Vitals Details Patient Name: Date of Service: Destiny [name]. 02/20/2023 10:00 A M Medical Record Number: [id number] Patient Account Number:  [id number] Date of Birth/Sex: Treating RN: 1946/05/22 (77 y.o. Destiny [name] Primary Care Norelle Runnion: Merri Brunette Other Clinician: Referring Burns Timson: Treating Tarrin Lebow/Extender: Willey Blade in Treatment: 39 Vital Signs Time Taken: 09:54 Temperature ([name]): 97.8 Height (in): 63 Pulse (bpm): 98 Weight (lbs): 182 Respiratory Rate (  breaths/min): 16 Douglass, Marielys [name] ([id number]) [id number] Page 7 of 7 Body Mass Index (BMI): 32.2 Blood Pressure (mmHg): 129/77 Reference Range: 80 - 120 mg / dl Electronic Signature(s) Signed: 02/20/2023 3:58:37 PM By: Destiny [name] Entered By: Destiny [name] on 02/20/2023 09:54:56

## 2023-02-21 NOTE — Progress Notes (Signed)
Destiny, [name] ([id number]) [id number] Page 1 of 9 Visit Report for 02/20/2023 Chief Complaint Document Details Patient Name: Date of Service: Destiny [name], Destiny [name] 02/20/2023 10:00 A M Medical Record Number: [id number] Patient Account Number: [id number] Date of Birth/Sex: Treating RN: 02-May-1946 (77 y.o. F) Primary Care Provider: Merri Brunette Other Clinician: Referring Provider: Treating Provider/Extender: Willey Blade in Treatment: 39 Information Obtained from: Patient Chief Complaint Patient presents to the wound care center with open non-healing surgical wound(s) Electronic Signature(s) Signed: 02/20/2023 10:20:58 AM By: Duanne Guess MD FACS Entered By: Duanne Guess on 02/20/2023 10:20:58 -------------------------------------------------------------------------------- Debridement Details Patient Name: Date of Service: Destiny Kindred F. 02/20/2023 10:00 A M Medical Record Number: [id number] Patient Account Number: [id number] Date of Birth/Sex: Treating RN: January 26, 1946 (77 y.o. Fredderick Phenix Primary Care Provider: Merri Brunette Other Clinician: Referring Provider: Treating Provider/Extender: Willey Blade in Treatment: 39 Debridement Performed for Assessment: Wound #1 Left Calcaneus Performed By: Physician Duanne Guess, MD Debridement Type: Debridement Severity of Tissue Pre Debridement: Fat layer exposed Level of Consciousness (Pre-procedure): Awake and Alert Pre-procedure Verification/Time Out Yes - 10:07 Taken: Start Time: 10:07 Pain Control: Lidocaine 4% Topical Solution Percent of Wound Bed Debrided: 100% T Area Debrided (cm): otal 0.55 Tissue and other material debrided: Non-Viable, Callus, Skin: Epidermis Level: Skin/Epidermis Debridement Description: Selective/Open Wound Instrument: Curette Bleeding: Minimum Hemostasis Achieved: Pressure Response to Treatment:  Procedure was tolerated well Level of Consciousness (Post- Awake and Alert procedure): Post Debridement Measurements of Total Wound Length: (cm) 0.7 Width: (cm) 1 Depth: (cm) 0.1 Volume: (cm) 0.055 Character of Wound/Ulcer Post Debridement: Improved Severity of Tissue Post Debridement: Fat layer exposed Post Procedure Diagnosis Same as Pre-procedure Notes scribed for Dr. Lady Gary by Samuella Bruin, RN Electronic Signature(s) Signed: 02/20/2023 10:34:50 AM By: Duanne Guess MD FACS Signed: 02/20/2023 3:58:37 PM By: Pura Spice ([id number]) [id number] Page 2 of 9 Entered By: Samuella Bruin on 02/20/2023 10:10:25 -------------------------------------------------------------------------------- HPI Details Patient Name: Date of Service: Destiny, [name] 02/20/2023 10:00 A M Medical Record Number: [id number] Patient Account Number: [id number] Date of Birth/Sex: Treating RN: [birth year] (77 y.o. 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 After the final reexcision, the wound had an ACell graft applied. She reports that she had good closure of skin over the site. Earlier this year, at the end of May, however, she was diagnosed with rectal cancer and is currently undergoing neoadjuvant therapy (chemotherapy and radiation) in anticipation of future surgery. She reports that about 2 weeks ago, she noted that her skin coverage over the heel was beginning to break down. Her oncologist felt that perhaps the Xeloda was contributing to wound failure and stopped this medication. She continues to receive neoadjuvant radiation therapy, however. She saw  plastic surgery last week, and they referred her to the wound care center for further evaluation and management. She is a type II diabetic, well controlled, with the most recent A1c available to me being 5.9%. ABIs done 1 year ago were normal. On the patient's left heel, there is an irregular wound with some skin maceration. The fat layer is exposed and there is slough over the entire surface. Outside of the area of maceration, the skin is in good condition. No erythema, induration, or odor.  No concern for infection. 05/26/2022: The wound is smaller today and is extremely clean, without any slough buildup. The periwound skin is in much better condition and is no longer macerated. She is scheduled to complete her radiation treatment on Wednesday. 06/03/2022: The wound continues to contract. There is just a little bit of eschar and callus accumulation. The surface is clean. She has completed all of her neoadjuvant therapy and is awaiting surgery in November. 06/10/2022: The wound is smaller by about a centimeter in all dimensions. She does have a little bit of eschar near the Achilles portion of the wound. The wound surface itself is robust and healthy-appearing. 06/24/2022: The wound continues to contract. There is just a small portion open in the center. She does have some dry skin and periwound callus circumferentially. No concern for infection. 07/08/2022: The wound is down to just a pinhole. There is a little bit of eschar on the surface. No concern for infection. 07/22/2022: Her wound is healed. 08/08/2022: Unfortunately, it seems that when her wound was being dressed at her last visit, the nurse applying the dressing pulled some dead skin off and reopened to the wound. I am not sure why this was not brought to my attention, but the patient has been caring for the wound at home with her supplies from her previous admission. She has been wearing her heel off loader and using Hydrofera Blue. There is  some eschar accumulation, but no erythema, induration, or other concern for infection. Her colorectal surgery is scheduled for next Wednesday. 09/03/2022: Ms. Belch returns after undergoing her colon surgery. She has been dressing the wound at home with [facility], but has not been wearing her heel cup. The wound is more open today with a lot of periwound callus, dry skin, and a light layer of slough. No overt signs of infection. 09/17/2022: The wound is little bit smaller today. There is some periwound tissue maceration. She says she has been wearing her heel cup more regularly. 10/01/2022: The wound looks a little smaller in terms of the open dimensions. There is more fresh epithelium present. The periwound skin is actually a bit dry and cracked with peeling skin. 10/16/2022: The periwound skin is extremely dry, thickened, and cracked. The wound is about the same size. There is slough on the wound surface. 10/30/2022: The periwound skin is dry but not nearly as thickened or cracked. The wound is a little bit smaller and more superficial. Minimal slough accumulation. She apparently was unable to get the endoform from the DME supplier so when she ran out of the leftovers from her clinic visit, she went back to using [facility]. 11/13/2022: There is a lot of dry skin around the wound. Otherwise the surface is clean. She has been approved for [employer]. 11/27/2022: The wound surface has robust granulation tissue and there is epithelium creeping around the perimeter. 12/11/2022: There is more epithelium filling in around the wound perimeter. 3/15; left calcaneus wounds that we have been placing TheraSkin on. 2 small wounds divided by epithelization. 01/08/2023: There has been more epithelialization of the wound. There is some dry eschar on the surface of the larger more distal aspect and around the edges. 01/23/2023: Much of the wound has epithelialized. There are a couple of small open areas with beefy  granulation tissue present. 02/06/2023: The silver collagen she has been using is sticking to the wound and its removal has torn away some of the epithelialized tissue, leaving the wound a bit bigger. 02/20/2023:  She finally received the actual endoform about a week ago and has been using this, rather than the collagen. There has been greater perimeter epithelialization and the wound surface has robust beefy granulation tissue. She does have an accumulation of callus around the margins. Electronic Signature(s) Signed: 02/20/2023 10:22:24 AM By: Duanne Guess MD FACS Entered By: Duanne Guess on 02/20/2023 10:22:24 Chiquita Loth ([id number]) [id number] Page 3 of 9 -------------------------------------------------------------------------------- Physical Exam Details Patient Name: Date of Service: EARNA, CAPUTI 02/20/2023 10:00 A M Medical Record Number: [id number] Patient Account Number: [id number] Date of Birth/Sex: Treating RN: [birth year] (77 y.o. F) Primary Care Provider: Merri Brunette Other Clinician: Referring Provider: Treating Provider/Extender: Willey Blade in Treatment: 39 Constitutional . . . . no acute distress. Respiratory Normal work of breathing on room air. Notes 02/20/2023: There has been greater perimeter epithelialization and the wound surface has robust beefy granulation tissue. She does have an accumulation of callus around the margins. Electronic Signature(s) Signed: 02/20/2023 10:23:02 AM By: Duanne Guess MD FACS Entered By: Duanne Guess on 02/20/2023 10:23:02 -------------------------------------------------------------------------------- Physician Orders Details Patient Name: Date of Service: Destiny Kindred F. 02/20/2023 10:00 A M Medical Record Number: [id number] Patient Account Number: [id number] Date of Birth/Sex: Treating RN: July 09, 1946 (77 y.o. Fredderick Phenix Primary Care  Provider: Merri Brunette Other Clinician: Referring Provider: Treating Provider/Extender: Willey Blade in Treatment: 44 Verbal / Phone Orders: No Diagnosis Coding ICD-10 Coding Code Description [phone] Non-pressure chronic ulcer of left heel and midfoot with fat layer exposed T81.31XS Disruption of external operation (surgical) wound, not elsewhere classified, sequela C43.9 Malignant melanoma of skin, unspecified C20 Malignant neoplasm of rectum E11.9 Type 2 diabetes mellitus without complications Z92.21 Personal history of antineoplastic chemotherapy Z92.3 Personal history of irradiation Follow-up Appointments ppointment in 2 weeks. - Dr. Lady Gary RM 2 Return A Anesthetic Wound #1 Left Calcaneus (In clinic) Topical Lidocaine 4% applied to wound bed Bathing/ Shower/ Hygiene May shower and wash wound with soap and water. Edema Control - Lymphedema / SCD / Other Avoid standing for long periods of time. Moisturize legs daily. Off-Loading Other: - Please wear off-loading shoe on left foot to keep pressure off the heel Wound Treatment Wound #1 - Calcaneus Wound Laterality: Left Cleanser: Normal Saline (Generic) Every Other Day/30 Days JANETZY, BARUT ([id number]) [id number] Page 4 of 9 Discharge Instructions: Cleanse the wound with Normal Saline prior to applying a clean dressing using gauze sponges, not tissue or cotton balls. Cleanser: Soap and Water Every Other Day/30 Days Discharge Instructions: May shower and wash wound with dial antibacterial soap and water prior to dressing change. Cleanser: Wound Cleanser Every Other Day/30 Days Discharge Instructions: Cleanse the wound with wound cleanser prior to applying a clean dressing using gauze sponges, not tissue or cotton balls. Prim Dressing: Endoform 2x2 in Every Other Day/30 Days ary Discharge Instructions: Moisten with saline Secondary Dressing: Woven Gauze Sponge,  Non-Sterile 4x4 in (Generic) Every Other Day/30 Days Discharge Instructions: Apply over primary dressing as directed. Secured With: [employer], 4.5x3.1 (in/yd) (Generic) Every Other Day/30 Days Discharge Instructions: Secure with Kerlix as directed. Secured With: 79M [employer] Scientist, research (life sciences) Surgical T 2x10 (in/yd) (Generic) Every Other Day/30 Days ape Discharge Instructions: Secure with tape as directed. Patient Medications llergies: propofol, aspirin, atorvastatin, Lisinopril, hydrocodone, penicillin A Notifications Medication Indication Start End 02/20/2023 lidocaine DOSE topical 4 % cream - cream topical Electronic Signature(s) Signed: 02/20/2023 10:34:50 AM By: Duanne Guess MD FACS Entered By: Duanne Guess on  02/20/2023 10:23:22 -------------------------------------------------------------------------------- Problem List Details Patient Name: Date of Service: AARON, BRISON 02/20/2023 10:00 A M Medical Record Number: [id number] Patient Account Number: [id number] Date of Birth/Sex: Treating RN: Aug 26, 1946 (77 y.o. F) Primary Care Provider: Merri Brunette Other Clinician: Referring Provider: Treating Provider/Extender: Willey Blade in Treatment: 39 Active Problems ICD-10 Encounter Code Description Active Date MDM Diagnosis L97.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 05/19/2022 No Yes T81.31XS Disruption of external operation (surgical) wound, not elsewhere classified, 05/19/2022 No Yes sequela C43.9 Malignant melanoma of skin, unspecified 05/19/2022 No Yes C20 Malignant neoplasm of rectum 05/19/2022 No Yes E11.9 Type 2 diabetes mellitus without complications 05/19/2022 No Yes Z92.21 Personal history of antineoplastic chemotherapy 05/19/2022 No Yes NOVALI, SEABOURN ([id number]) [id number] Page 5 of 9 Z92.3 Personal history of irradiation 05/19/2022 No Yes Inactive Problems Resolved  Problems Electronic Signature(s) Signed: 02/20/2023 10:20:02 AM By: Duanne Guess MD FACS Entered By: Duanne Guess on 02/20/2023 10:20:02 -------------------------------------------------------------------------------- Progress Note Details Patient Name: Date of Service: Destiny Kindred F. 02/20/2023 10:00 A M Medical Record Number: [id number] Patient Account Number: [id number] Date of Birth/Sex: Treating RN: 07/09/1946 (77 y.o. 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 After the final reexcision, the wound had an ACell graft applied. She reports that she had good closure of skin over the site. Earlier this year, at the end of May, however, she was diagnosed with rectal cancer and is currently undergoing neoadjuvant therapy (chemotherapy and radiation) in anticipation of future surgery. She reports that about 2 weeks ago, she noted that her skin coverage over the heel was beginning to break down. Her oncologist felt that perhaps the Xeloda was contributing to wound failure and stopped this medication. She continues to receive neoadjuvant radiation therapy, however. She saw plastic surgery last week, and they referred her to the wound care center for further evaluation and management. She is a type II diabetic, well controlled, with the most recent A1c available to me being 5.9%. ABIs done 1 year ago were normal. On the patient's left heel, there is an irregular wound with some skin maceration. The fat layer is exposed and there is slough over the entire surface.  Outside of the area of maceration, the skin is in good condition. No erythema, induration, or odor. No concern for infection. 05/26/2022: The wound is smaller today and is extremely clean, without any slough buildup. The periwound skin is in much better condition and is no longer macerated. She is scheduled to complete her radiation treatment on Wednesday. 06/03/2022: The wound continues to contract. There is just a little bit of eschar and callus accumulation. The surface is clean. She has completed all of her neoadjuvant therapy and is awaiting surgery in November. 06/10/2022: The wound is smaller by about a centimeter in all dimensions. She does have a little bit of eschar near the Achilles portion of the wound. The wound surface itself is robust and healthy-appearing. 06/24/2022: The wound continues to contract. There is just a small portion open in the center. She does have some dry skin and periwound callus circumferentially. No concern for infection. 07/08/2022: The wound is  down to just a pinhole. There is a little bit of eschar on the surface. No concern for infection. 07/22/2022: Her wound is healed. 08/08/2022: Unfortunately, it seems that when her wound was being dressed at her last visit, the nurse applying the dressing pulled some dead skin off and reopened to the wound. I am not sure why this was not brought to my attention, but the patient has been caring for the wound at home with her supplies from her previous admission. She has been wearing her heel off loader and using Hydrofera Blue. There is some eschar accumulation, but no erythema, induration, or other concern for infection. Her colorectal surgery is scheduled for next Wednesday. 09/03/2022: Ms. Hawken returns after undergoing her colon surgery. She has been dressing the wound at home with [facility], but has not been wearing her heel cup. The wound is more open today with a lot of periwound callus, dry skin, and a light  layer of slough. No overt signs of infection. 09/17/2022: The wound is little bit smaller today. There is some periwound tissue maceration. She says she has been wearing her heel cup more regularly. 10/01/2022: The wound looks a little smaller in terms of the open dimensions. There is more fresh epithelium present. The periwound skin is actually a bit dry and cracked with peeling skin. 10/16/2022: The periwound skin is extremely dry, thickened, and cracked. The wound is about the same size. There is slough on the wound surface. 10/30/2022: The periwound skin is dry but not nearly as thickened or cracked. The wound is a little bit smaller and more superficial. Minimal slough accumulation. She apparently was unable to get the endoform from the DME supplier so when she ran out of the leftovers from her clinic visit, she went back to using [facility]. 11/13/2022: There is a lot of dry skin around the wound. Otherwise the surface is clean. She has been approved for [employer]. ARYONNA, VERDERBER ([id number]) [id number] Page 6 of 9 11/27/2022: The wound surface has robust granulation tissue and there is epithelium creeping around the perimeter. 12/11/2022: There is more epithelium filling in around the wound perimeter. 3/15; left calcaneus wounds that we have been placing TheraSkin on. 2 small wounds divided by epithelization. 01/08/2023: There has been more epithelialization of the wound. There is some dry eschar on the surface of the larger more distal aspect and around the edges. 01/23/2023: Much of the wound has epithelialized. There are a couple of small open areas with beefy granulation tissue present. 02/06/2023: The silver collagen she has been using is sticking to the wound and its removal has torn away some of the epithelialized tissue, leaving the wound a bit bigger. 02/20/2023: She finally received the actual endoform about a week ago and has been using this, rather than the  collagen. There has been greater perimeter epithelialization and the wound surface has robust beefy granulation tissue. She does have an accumulation of callus around the margins. Patient History Information obtained from Patient. Social History Former smoker - 1991, Marital Status - Divorced, Alcohol Use - Never, Drug Use - No History, Caffeine Use - Moderate - Diet coke,coffee. Medical History Endocrine Patient has history of Type II Diabetes - Metformin pillsand Ozempic Musculoskeletal Patient has history of Osteoarthritis Hospitalization/Surgery History - Melanoma excision to left heel;ORIF Left ankle fracture;[facility] Kitchen - rectal surgery. Medical A Surgical History Notes nd Eyes Macular Degeneration Ear/Nose/Mouth/Throat Tinnitus Oncologic 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 has had Chemotherapy (stopped) and Radiation -8 treatments left as  of 05/19/22 Rectal cancer Psychiatric Hx Depression Objective Constitutional no acute distress. Vitals Time Taken: 9:54 AM, Height: 63 in, Weight: 182 lbs, BMI: 32.2, Temperature: 97.8 F, Pulse: 98 bpm, Respiratory Rate: 16 breaths/min, Blood Pressure: 129/77 mmHg. Respiratory Normal work of breathing on room air. General Notes: 02/20/2023: There has been greater perimeter epithelialization and the wound surface has robust beefy granulation tissue. She does have an accumulation of callus around the margins. Integumentary (Hair, Skin) Wound #1 status is Open. Original cause of wound was Hematoma. The date acquired was: 05/05/2022. The wound has been in treatment 39 weeks. The wound is located on the Left Calcaneus. The wound measures 0.7cm length x 1cm width x 0.1cm depth; 0.55cm^2 area and 0.055cm^3 volume. There is Fat Layer (Subcutaneous Tissue) exposed. There is no tunneling or undermining noted. There is a medium amount of serosanguineous drainage noted. The wound margin is distinct with the outline attached to the wound base. There is large (67-100%) red  granulation within the wound bed. There is no necrotic tissue within the wound bed. The periwound skin appearance had no abnormalities noted for moisture. The periwound skin appearance had no abnormalities noted for color. The periwound skin appearance exhibited: Callus. Periwound temperature was noted as No Abnormality. Assessment Active Problems ICD-10 Non-pressure chronic ulcer of left heel and midfoot with fat layer exposed Disruption of external operation (surgical) wound, not elsewhere classified, sequela Malignant melanoma of skin, unspecified Malignant neoplasm of rectum Type 2 diabetes mellitus without complications Personal history of antineoplastic chemotherapy EMIRI, ESCUDERO ([id number]) [id number] Page 7 of 9 Personal history of irradiation Procedures Wound #1 Pre-procedure diagnosis of Wound #1 is a Diabetic Wound/Ulcer of the Lower Extremity located on the Left Calcaneus .Severity of Tissue Pre Debridement is: Fat layer exposed. There was a Selective/Open Wound Skin/Epidermis Debridement with a total area of 0.55 sq cm performed by Duanne Guess, MD. With the following instrument(s): Curette to remove Non-Viable tissue/material. Material removed includes Callus and Skin: Epidermis and after achieving pain control using Lidocaine 4% T opical Solution. No specimens were taken. A time out was conducted at 10:07, prior to the start of the procedure. A Minimum amount of bleeding was controlled with Pressure. The procedure was tolerated well. Post Debridement Measurements: 0.7cm length x 1cm width x 0.1cm depth; 0.055cm^3 volume. Character of Wound/Ulcer Post Debridement is improved. Severity of Tissue Post Debridement is: Fat layer exposed. Post procedure Diagnosis Wound #1: Same as Pre-Procedure General Notes: scribed for Dr. Lady Gary by Samuella Bruin, RN. Plan Follow-up Appointments: Return Appointment in 2 weeks. - Dr. Lady Gary RM  2 Anesthetic: Wound #1 Left Calcaneus: (In clinic) Topical Lidocaine 4% applied to wound bed Bathing/ Shower/ Hygiene: May shower and wash wound with soap and water. Edema Control - Lymphedema / SCD / Other: Avoid standing for long periods of time. Moisturize legs daily. Off-Loading: Other: - Please wear off-loading shoe on left foot to keep pressure off the heel The following medication(s) was prescribed: lidocaine topical 4 % cream cream topical was prescribed at facility WOUND #1: - Calcaneus Wound Laterality: Left Cleanser: Normal Saline (Generic) Every Other Day/30 Days Discharge Instructions: Cleanse the wound with Normal Saline prior to applying a clean dressing using gauze sponges, not tissue or cotton balls. Cleanser: Soap and Water Every Other Day/30 Days Discharge Instructions: May shower and wash wound with dial antibacterial soap and water prior to dressing change. Cleanser: Wound Cleanser Every Other Day/30 Days Discharge Instructions: Cleanse the wound with wound cleanser prior to applying  a clean dressing using gauze sponges, not tissue or cotton balls. Prim Dressing: Endoform 2x2 in Every Other Day/30 Days ary Discharge Instructions: Moisten with saline Secondary Dressing: Woven Gauze Sponge, Non-Sterile 4x4 in (Generic) Every Other Day/30 Days Discharge Instructions: Apply over primary dressing as directed. Secured With: [employer], 4.5x3.1 (in/yd) (Generic) Every Other Day/30 Days Discharge Instructions: Secure with Kerlix as directed. Secured With: 66M [employer] Scientist, research (life sciences) Surgical T 2x10 (in/yd) (Generic) Every Other Day/30 Days ape Discharge Instructions: Secure with tape as directed. 02/20/2023: There has been greater perimeter epithelialization and the wound surface has robust beefy granulation tissue. She does have an accumulation of callus around the margins. I used a curette to debride callus and loose, not adherent skin from the wound. I think now that  we have the correct product, we should begin to see a greater rate of epithelialization. We will continue to use the endoform and heel offloading measures. She does have an appointment in July with plastic surgery to discuss skin graft coverage of the site. She will follow-up here in 2 weeks. Electronic Signature(s) Signed: 02/20/2023 10:24:31 AM By: Duanne Guess MD FACS Entered By: Duanne Guess on 02/20/2023 10:24:31 -------------------------------------------------------------------------------- HxROS Details Patient Name: Date of Service: Destiny Kindred F. 02/20/2023 10:00 A M Medical Record Number: [id number] Patient Account Number: [id number] Date of Birth/Sex: Treating RN: [birth year] (77 y.o. F) Primary Care Provider: Merri Brunette Other Clinician: Referring Provider: Treating Provider/Extender: Willey Blade in Treatment: [address], [place] F ([id number]) [id number] Page 8 of 9 Information Obtained From Patient Eyes Medical History: Past Medical History Notes: Macular Degeneration Ear/Nose/Mouth/Throat Medical History: Past Medical History Notes: Tinnitus Endocrine Medical History: Positive for: Type II Diabetes - Metformin pillsand Ozempic Time with diabetes: 25 years Treated with: Oral agents Blood sugar tested every day: Yes Tested : every day Musculoskeletal Medical History: Positive for: Osteoarthritis Oncologic Medical History: Past Medical History Notes: 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 has had Chemotherapy (stopped) and Radiation -8 treatments left as of 05/19/22 Rectal cancer Psychiatric Medical History: Past Medical History Notes: Hx Depression Immunizations Pneumococcal Vaccine: Received Pneumococcal Vaccination: Yes Received Pneumococcal Vaccination On or After 60th Birthday: Yes Implantable Devices None Hospitalization / Surgery History Type of Hospitalization/Surgery Melanoma excision to left  heel;ORIF Left ankle fracture; rectal surgery Family and Social History Former smoker - 1991; Marital Status - Divorced; Alcohol Use: Never; Drug Use: No History; Caffeine Use: Moderate - Diet coke,coffee; Financial Concerns: No; Food, Clothing or Shelter Needs: No; Support System Lacking: No; Transportation Concerns: No Electronic Signature(s) Signed: 02/20/2023 10:34:50 AM By: Duanne Guess MD FACS Entered By: Duanne Guess on 02/20/2023 10:22:29 -------------------------------------------------------------------------------- SuperBill Details Patient Name: Date of Service: Earvin Hansen 02/20/2023 Medical Record Number: [id number] Patient Account Number: [id number] Date of Birth/Sex: Treating RN: 07-22-1946 (77 y.o. F) Primary Care Provider: Merri Brunette Other Clinician: Referring Provider: Treating Provider/Extender: Willey Blade in Treatment: [address], [place] F ([id number]) [id number] Page 9 of 9 Diagnosis Coding ICD-10 Codes Code Description L97.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 T81.31XS Disruption of external operation (surgical) wound, not elsewhere classified, sequela C43.9 Malignant melanoma of skin, unspecified C20 Malignant neoplasm of rectum E11.9 Type 2 diabetes mellitus without complications Z92.21 Personal history of antineoplastic chemotherapy Z92.3 Personal history of irradiation Facility Procedures : CPT4 Code: 62952841 Description: 32440 - DEBRIDE WOUND 1ST 20 SQ CM OR < ICD-10 Diagnosis Description L97.422 Non-pressure chronic ulcer of left heel and midfoot with  fat layer exposed Modifier: Quantity: 1 Physician Procedures : CPT4 Code Description Modifier [id number] - WC PHYS LEVEL 3 - EST PT 25 ICD-10 Diagnosis Description L97.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 T81.31XS Disruption of external operation  (surgical) wound, not  elsewhere classified, sequela C43.9 Malignant melanoma of skin, unspecified E11.9 Type 2 diabetes mellitus without complications Quantity: 1 : [id number] - WC PHYS DEBR WO ANESTH 20 SQ CM ICD-10 Diagnosis Description L97.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 Quantity: 1 Electronic Signature(s) Signed: 02/20/2023 10:26:12 AM By: Duanne Guess MD FACS Entered By: Duanne Guess on 02/20/2023 10:26:12

## 2023-03-02 ENCOUNTER — Inpatient Hospital Stay: Payer: PPO | Attending: Oncology

## 2023-03-02 ENCOUNTER — Inpatient Hospital Stay: Payer: PPO | Admitting: Oncology

## 2023-03-02 VITALS — BP 134/84 | HR 70 | Temp 98.2°F | Resp 18 | Ht 63.0 in | Wt 189.3 lb

## 2023-03-02 DIAGNOSIS — C20 Malignant neoplasm of rectum: Secondary | ICD-10-CM | POA: Diagnosis not present

## 2023-03-02 DIAGNOSIS — Z85040 Personal history of malignant carcinoid tumor of rectum: Secondary | ICD-10-CM | POA: Diagnosis not present

## 2023-03-02 DIAGNOSIS — L97429 Non-pressure chronic ulcer of left heel and midfoot with unspecified severity: Secondary | ICD-10-CM | POA: Diagnosis not present

## 2023-03-02 LAB — CEA (ACCESS): CEA (CHCC): 2.19 ng/mL (ref 0.00–5.00)

## 2023-03-02 NOTE — Progress Notes (Signed)
  [facility] OFFICE PROGRESS NOTE   Diagnosis: Rectal cancer  INTERVAL HISTORY:   Destiny [name] returns as scheduled.  She feels well.  No difficulty with bowel function.  No bleeding.  She continues to have a nonhealing ulcer at the left heel.  She is followed in the wound clinic and has been referred to plastic surgery.   Objective:  Vital signs in last 24 hours:  Blood pressure 134/84, pulse 70, temperature 98.2 F (36.8 C), temperature source Oral, resp. rate 18, height 5\' 3"  (1.6 m), weight 189 lb 4.8 oz (85.9 kg), SpO2 98 %.     Lymphatics: No cervical, supraclavicular, axillary, or inguinal nodes, nodular subcutaneous tissue surrounding the left inguinal scar (chronic per patient report) Resp: Lungs clear bilaterally Cardio: Regular rate and rhythm GI: No hepatosplenomegaly, nodularity deep to the upper abdomen transverse scar, soft mobile 2 cm cutaneous lesion at the lateral mid left abdomen Vascular: No leg edema  Lab Results:  Lab Results  Component Value Date   WBC 6.7 08/16/2022   HGB 8.5 (L) 08/16/2022   HCT 27.0 (L) 08/16/2022   MCV 95.4 08/16/2022   PLT 177 08/16/2022   NEUTROABS 2.6 06/06/2022    CMP  Lab Results  Component Value Date   NA 139 08/16/2022   K 3.7 08/16/2022   CL 102 08/16/2022   CO2 29 08/16/2022   GLUCOSE 121 (H) 08/16/2022   BUN 18 08/16/2022   CREATININE 0.96 08/16/2022   CALCIUM 8.2 (L) 08/16/2022   PROT 6.8 06/06/2022   ALBUMIN 4.2 06/06/2022   AST 13 (L) 06/06/2022   ALT 8 06/06/2022   ALKPHOS 44 06/06/2022   BILITOT 0.6 06/06/2022   GFRNONAA >60 08/16/2022   GFRAA >60 09/24/2017    Lab Results  Component Value Date   CEA 2.19 03/02/2023    Medications: I have reviewed the patient's current medications.   Assessment/Plan: Rectal cancer Colonoscopy 03/06/2022-nonobstructing mass at 12 cm from the anal verge-biopsy invasive moderately differentiated adenocarcinoma arising within a tubular adenoma with  high-grade dysplasia CTs 03/13/2022-no rectal mass seen, no evidence of metastatic disease, small 2-4 mm pulmonary nodules-at least 2 are calcified, likely benign granulomas, nonobstructing stone in the interpolar right renal collecting system MRI pelvis 03/30/2022-tumor at 11-13 cm from the anal verge, T3b, early EMVI?,  No adenopathy Neoadjuvant radiation/capecitabine 04/21/2022-05/28/2022 Xeloda discontinued 05/15/2022 due to progressive breakdown/ulceration at the left heel. Low anterior resection 08/13/2022-negative for residual cancer,[id number], 0/14 nodes margins negative, no distinct mass, 2.1 x 1.5 cm minimally ulcerated firm area of mucosa   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  Diabetes   4.  Laparoscopic gastric band surgery  5.  Left heel ulceration-progressive 05/15/2022.  Xeloda discontinued.  Followed at the wound clinic.   Disposition: Destiny [name] is in clinical remission from rectal cancer.  She will return for an office visit and CEA in 6 months.  She should have a surveillance colonoscopy with Dr. Orvan Falconer later this year.  She will continue follow-up at the wound clinic and with plastic surgery for management of the nonhealing ulcer at the left heel.  Thornton Papas, MD  03/02/2023  3:28 PM

## 2023-03-06 ENCOUNTER — Encounter (HOSPITAL_BASED_OUTPATIENT_CLINIC_OR_DEPARTMENT_OTHER): Payer: PPO | Admitting: General Surgery

## 2023-03-06 DIAGNOSIS — E11621 Type 2 diabetes mellitus with foot ulcer: Secondary | ICD-10-CM | POA: Diagnosis not present

## 2023-03-06 DIAGNOSIS — T8131XA Disruption of external operation (surgical) wound, not elsewhere classified, initial encounter: Secondary | ICD-10-CM | POA: Diagnosis not present

## 2023-03-20 ENCOUNTER — Encounter (HOSPITAL_BASED_OUTPATIENT_CLINIC_OR_DEPARTMENT_OTHER): Payer: PPO | Attending: General Surgery | Admitting: Internal Medicine

## 2023-03-20 DIAGNOSIS — Z923 Personal history of irradiation: Secondary | ICD-10-CM | POA: Diagnosis not present

## 2023-03-20 DIAGNOSIS — E11621 Type 2 diabetes mellitus with foot ulcer: Secondary | ICD-10-CM | POA: Insufficient documentation

## 2023-03-20 DIAGNOSIS — C20 Malignant neoplasm of rectum: Secondary | ICD-10-CM | POA: Insufficient documentation

## 2023-03-20 DIAGNOSIS — Z85820 Personal history of malignant melanoma of skin: Secondary | ICD-10-CM | POA: Diagnosis not present

## 2023-03-20 DIAGNOSIS — Z9221 Personal history of antineoplastic chemotherapy: Secondary | ICD-10-CM | POA: Diagnosis not present

## 2023-03-20 DIAGNOSIS — L97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: Secondary | ICD-10-CM | POA: Insufficient documentation

## 2023-03-31 ENCOUNTER — Ambulatory Visit (HOSPITAL_BASED_OUTPATIENT_CLINIC_OR_DEPARTMENT_OTHER): Payer: PPO | Admitting: General Surgery

## 2023-04-13 ENCOUNTER — Ambulatory Visit (HOSPITAL_BASED_OUTPATIENT_CLINIC_OR_DEPARTMENT_OTHER): Payer: PPO | Admitting: General Surgery

## 2023-04-14 ENCOUNTER — Encounter (HOSPITAL_BASED_OUTPATIENT_CLINIC_OR_DEPARTMENT_OTHER): Payer: PPO | Attending: General Surgery | Admitting: General Surgery

## 2023-04-14 DIAGNOSIS — E11621 Type 2 diabetes mellitus with foot ulcer: Secondary | ICD-10-CM | POA: Insufficient documentation

## 2023-04-14 DIAGNOSIS — L97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: Secondary | ICD-10-CM | POA: Insufficient documentation

## 2023-04-14 DIAGNOSIS — X58XXXS Exposure to other specified factors, sequela: Secondary | ICD-10-CM | POA: Diagnosis not present

## 2023-04-14 DIAGNOSIS — C20 Malignant neoplasm of rectum: Secondary | ICD-10-CM | POA: Insufficient documentation

## 2023-04-14 DIAGNOSIS — C439 Malignant melanoma of skin, unspecified: Secondary | ICD-10-CM | POA: Diagnosis not present

## 2023-04-14 DIAGNOSIS — T8131XS Disruption of external operation (surgical) wound, not elsewhere classified, sequela: Secondary | ICD-10-CM | POA: Diagnosis not present

## 2023-04-14 NOTE — Progress Notes (Signed)
Destiny [name] ([id number]) [id number] Page 1 of 10 Visit Report for 04/14/2023 Chief Complaint Document Details Patient Name: Date of Service: Destiny [name], Destiny [name] 04/14/2023 1:00 PM Medical Record Number: [id number] Patient Account Number: [id number] Date of Birth/Sex: Treating RN: 1945-10-27 (77 y.o. F) Primary Care Provider: Merri Brunette Other Clinician: Referring Provider: Treating Provider/Extender: Willey Blade in Treatment: 47 Information Obtained from: Patient Chief Complaint Patient presents to the wound care center with open non-healing surgical wound(s) Electronic Signature(s) Signed: 04/14/2023 2:15:11 PM By: Duanne Guess MD FACS Entered By: Duanne Guess on 04/14/2023 14:15:11 -------------------------------------------------------------------------------- Debridement Details Patient Name: Date of Service: Destiny Kindred F. 04/14/2023 1:00 PM Medical Record Number: [id number] Patient Account Number: [id number] Date of Birth/Sex: Treating RN: [birth year] (77 y.o. Katrinka Blazing Primary Care Provider: Merri Brunette Other Clinician: Referring Provider: Treating Provider/Extender: Willey Blade in Treatment: 47 Debridement Performed for Assessment: Wound #1 Left Calcaneus Performed By: Physician Duanne Guess, MD Debridement Type: Debridement Severity of Tissue Pre Debridement: Fat layer exposed Level of Consciousness (Pre-procedure): Awake and Alert Pre-procedure Verification/Time Out Yes - 13:06 Taken: Start Time: 13:06 Pain Control: Lidocaine 5% topical ointment Percent of Wound Bed Debrided: 100% T Area Debrided (cm): otal 7.85 Tissue and other material debrided: Non-Viable, Slough, Slough Level: Non-Viable Tissue Debridement Description: Selective/Open Wound Instrument: Curette Bleeding: Minimum Hemostasis Achieved: Pressure End Time: 13:07 Procedural Pain: 0 Post  Procedural Pain: 0 Response to Treatment: Procedure was tolerated well Level of Consciousness (Post- Awake and Alert procedure): Post Debridement Measurements of Total Wound Length: (cm) 4 Width: (cm) 2.5 Depth: (cm) 0.1 Volume: (cm) 0.785 Character of Wound/Ulcer Post Debridement: Improved Destiny [name] ([id number]) [id number] Page 2 of 10 Severity of Tissue Post Debridement: Fat layer exposed Post Procedure Diagnosis Same as Pre-procedure Notes Scribed for Dr. Lady [name] by J.Scotton Electronic Signature(s) Signed: 04/14/2023 4:21:51 PM By: Duanne Guess MD FACS Signed: 04/14/2023 5:05:24 PM By: Karie Schwalbe RN Entered By: Karie Schwalbe on 04/14/2023 13:11:12 -------------------------------------------------------------------------------- HPI Details Patient Name: Date of Service: Destiny Kindred F. 04/14/2023 1:00 PM Medical Record Number: [id number] Patient Account Number: [id number] Date of Birth/Sex: Treating RN: 1945/10/22 (77 y.o. 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 After the final reexcision, the wound had an ACell graft applied. She reports that she had good closure of skin over the site. Earlier this year, at the end of May, however, she was diagnosed with rectal cancer and is currently undergoing neoadjuvant therapy (chemotherapy and radiation) in anticipation of future surgery. She reports that about 2 weeks ago, she noted that her skin coverage over the heel was beginning to break down. Her oncologist felt that perhaps the Xeloda was contributing to wound failure and stopped this medication. She continues to receive neoadjuvant radiation  therapy, however. She saw plastic surgery last week, and they referred her to the wound care center for further evaluation and management. She is a type II diabetic, well controlled, with the most recent A1c available to me being 5.9%. ABIs done 1 year ago were normal. On the patient's left heel, there is an irregular wound with some skin maceration. The fat layer is exposed and there is slough over the entire surface. Outside of the area of maceration, the skin is in good  condition. No erythema, induration, or odor. No concern for infection. 05/26/2022: The wound is smaller today and is extremely clean, without any slough buildup. The periwound skin is in much better condition and is no longer macerated. She is scheduled to complete her radiation treatment on Wednesday. 06/03/2022: The wound continues to contract. There is just a little bit of eschar and callus accumulation. The surface is clean. She has completed all of her neoadjuvant therapy and is awaiting surgery in November. 06/10/2022: The wound is smaller by about a centimeter in all dimensions. She does have a little bit of eschar near the Achilles portion of the wound. The wound surface itself is robust and healthy-appearing. 06/24/2022: The wound continues to contract. There is just a small portion open in the center. She does have some dry skin and periwound callus circumferentially. No concern for infection. 07/08/2022: The wound is down to just a pinhole. There is a little bit of eschar on the surface. No concern for infection. 07/22/2022: Her wound is healed. 08/08/2022: Unfortunately, it seems that when her wound was being dressed at her last visit, the nurse applying the dressing pulled some dead skin off and reopened to the wound. I am not sure why this was not brought to my attention, but the patient has been caring for the wound at home with her supplies from her previous admission. She has been wearing her heel off loader and using  Hydrofera Blue. There is some eschar accumulation, but no erythema, induration, or other concern for infection. Her colorectal surgery is scheduled for next Wednesday. 09/03/2022: Destiny [name] returns after undergoing her colon surgery. She has been dressing the wound at home with [facility], but has not been wearing her heel cup. The wound is more open today with a lot of periwound callus, dry skin, and a light layer of slough. No overt signs of infection. 09/17/2022: The wound is little bit smaller today. There is some periwound tissue maceration. She says she has been wearing her heel cup more regularly. 10/01/2022: The wound looks a little smaller in terms of the open dimensions. There is more fresh epithelium present. The periwound skin is actually a bit dry and cracked with peeling skin. 10/16/2022: The periwound skin is extremely dry, thickened, and cracked. The wound is about the same size. There is slough on the wound surface. 10/30/2022: The periwound skin is dry but not nearly as thickened or cracked. The wound is a little bit smaller and more superficial. Minimal slough accumulation. She apparently was unable to get the endoform from the DME supplier so when she ran out of the leftovers from her clinic visit, she went back to using [facility]. 11/13/2022: There is a lot of dry skin around the wound. Otherwise the surface is clean. She has been approved for [employer]. UDY, PORE ([id number]) [id number] Page 3 of 10 11/27/2022: The wound surface has robust granulation tissue and there is epithelium creeping around the perimeter. 12/11/2022: There is more epithelium filling in around the wound perimeter. 3/15; left calcaneus wounds that we have been placing TheraSkin on. 2 small wounds divided by epithelization. 01/08/2023: There has been more epithelialization of the wound. There is some dry eschar on the surface of the larger more distal aspect and around  the edges. 01/23/2023: Much of the wound has epithelialized. There are a couple of small open areas with beefy granulation tissue present. 02/06/2023: The silver collagen she has been using is sticking to the wound and its removal  has torn away some of the epithelialized tissue, leaving the wound a bit bigger. 02/20/2023: She finally received the actual endoform about a week ago and has been using this, rather than the collagen. There has been greater perimeter epithelialization and the wound surface has robust beefy granulation tissue. She does have an accumulation of callus around the margins. 03/06/2023: The wound looks about the best as I have ever seen it. There is beautiful granulation tissue on the surface. The periwound skin, although somewhat callused, is not peeling and flaking away. The entire wound is very clean. 6/7; refractory wound near the tip of the left heel. 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 The patient is changing the dressing herself. She has a heel offloading shoe. She is vigilant about keeping the pressure off this area even when she is in bed at night. She did not receive radiation 04/14/2023: The wound is a little bit larger today, but the entire surface has nice granulation tissue. No signs of infection. There is slough buildup. Her appointment with plastic surgery is 2 weeks from today. Electronic Signature(s) Signed: 04/14/2023 2:16:23 PM By: Duanne Guess MD FACS Entered By: Duanne Guess on 04/14/2023 14:16:22 -------------------------------------------------------------------------------- Physical Exam Details Patient Name: Date of Service: Destiny Kindred F. 04/14/2023 1:00 PM Medical Record Number: [id number] Patient Account Number: [id number] Date of Birth/Sex: Treating RN: 25-Mar-1946 (77 y.o. F) Primary Care Provider: Merri Brunette Other Clinician: Referring Provider: Treating Provider/Extender: Willey Blade  in Treatment: 47 Constitutional . . . . no acute distress. Respiratory Normal work of breathing on room air. Notes 04/14/2023: The wound is a little bit larger today, but the entire surface has nice granulation tissue. No signs of infection. There is slough buildup. Electronic Signature(s) Signed: 04/14/2023 2:16:58 PM By: Duanne Guess MD FACS Entered By: Duanne Guess on 04/14/2023 14:16:58 -------------------------------------------------------------------------------- Physician Orders Details Patient Name: Date of Service: Destiny Kindred F. 04/14/2023 1:00 PM Medical Record Number: [id number] Patient Account Number: [id number] Date of Birth/Sex: Treating RN: 07-Oct-1946 (77 y.o. Katrinka Blazing Primary Care Provider: Merri Brunette Other Clinician: Referring Provider: Treating Provider/Extender: Willey Blade in Treatment: 75 Verbal / Phone Orders: No ENEYDA, BOLINSKI ([id number]) [id number] Page 4 of 10 Diagnosis Coding ICD-10 Coding Code Description L97.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 T81.31XS Disruption of external operation (surgical) wound, not elsewhere classified, sequela C43.9 Malignant melanoma of skin, unspecified C20 Malignant neoplasm of rectum E11.9 Type 2 diabetes mellitus without complications Z92.21 Personal history of antineoplastic chemotherapy Z92.3 Personal history of irradiation Follow-up Appointments ppointment in 2 weeks. - Dr. Lady [name] RM 2 ++Remember Plastic Surgery Appointment 05/05/23 Return A Anesthetic (In clinic) Topical Lidocaine 4% applied to wound bed Bathing/ Shower/ Hygiene May shower and wash wound with soap and water. Edema Control - Lymphedema / SCD / Other Avoid standing for long periods of time. Moisturize legs daily. Off-Loading Other: - Please wear off-loading shoe on left foot to keep pressure off the heel Wound Treatment Wound  #1 - Calcaneus Wound Laterality: Left Cleanser: Normal Saline (Generic) Every Other Day/30 Days Discharge Instructions: Cleanse the wound with Normal Saline prior to applying a clean dressing using gauze sponges, not tissue or cotton balls. Cleanser: Soap and Water Every Other Day/30 Days Discharge Instructions: May shower and wash wound with dial antibacterial soap and water prior to dressing change. Cleanser: Wound Cleanser Every Other Day/30 Days Discharge Instructions: Cleanse the wound with wound  cleanser prior to applying a clean dressing using gauze sponges, not tissue or cotton balls. Topical: Mupirocin Ointment Every Other Day/30 Days Discharge Instructions: Apply Mupirocin (Bactroban) as instructed Prim Dressing: Endoform 2x2 in Every Other Day/30 Days ary Discharge Instructions: Moisten with saline Secondary Dressing: Woven Gauze Sponge, Non-Sterile 4x4 in (Generic) Every Other Day/30 Days Discharge Instructions: Apply over primary dressing as directed. Secured With: [employer], 4.5x3.1 (in/yd) (Generic) Every Other Day/30 Days Discharge Instructions: Secure with Kerlix as directed. Secured With: 51M [employer] Scientist, research (life sciences) Surgical T 2x10 (in/yd) (Generic) Every Other Day/30 Days ape Discharge Instructions: Secure with tape as directed. Electronic Signature(s) Signed: 04/14/2023 4:21:51 PM By: Duanne Guess MD FACS Entered By: Duanne Guess on 04/14/2023 14:17:11 -------------------------------------------------------------------------------- Problem List Details Patient Name: Date of Service: Destiny Kindred F. 04/14/2023 1:00 PM Medical Record Number: [id number] Patient Account Number: [id number] Date of Birth/Sex: Treating RN: [birth year] (77 y.o. F) Primary Care Provider: Merri Brunette Other Clinician: Chiquita Loth ([id number]) [id number] Page 5 of 10 Referring Provider: Treating Provider/Extender: Willey Blade in Treatment: 47 Active Problems ICD-10 Encounter Code Description Active Date MDM Diagnosis L97.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 05/19/2022 No Yes T81.31XS Disruption of external operation (surgical) wound, not elsewhere classified, 05/19/2022 No Yes sequela C43.9 Malignant melanoma of skin, unspecified 05/19/2022 No Yes C20 Malignant neoplasm of rectum 05/19/2022 No Yes E11.9 Type 2 diabetes mellitus without complications 05/19/2022 No Yes Z92.21 Personal history of antineoplastic chemotherapy 05/19/2022 No Yes Z92.3 Personal history of irradiation 05/19/2022 No Yes Inactive Problems Resolved Problems Electronic Signature(s) Signed: 04/14/2023 2:13:25 PM By: Duanne Guess MD FACS Entered By: Duanne Guess on 04/14/2023 14:13:25 -------------------------------------------------------------------------------- Progress Note Details Patient Name: Date of Service: Destiny Kindred F. 04/14/2023 1:00 PM Medical Record Number: [id number] Patient Account Number: [id number] Date of Birth/Sex: Treating RN: [birth year] (77 y.o. 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 After the final reexcision, the wound had an ACell graft applied. She reports that she had good closure of skin over the site. Earlier this year, at the end of May, however, she was diagnosed with rectal LATAIJA, BLIGEN ([id number]) [id number] Page 6 of 10 cancer and is currently undergoing neoadjuvant therapy (chemotherapy and  radiation) in anticipation of future surgery. She reports that about 2 weeks ago, she noted that her skin coverage over the heel was beginning to break down. Her oncologist felt that perhaps the Xeloda was contributing to wound failure and stopped this medication. She continues to receive neoadjuvant radiation therapy, however. She saw plastic surgery last week, and they referred her to the wound care center for further evaluation and management. She is a type II diabetic, well controlled, with the most recent A1c available to me being 5.9%. ABIs done 1 year ago were normal. On the patient's left heel, there is an irregular wound with some skin maceration. The fat layer is exposed and there is slough over the entire surface. Outside of the area of maceration, the skin is in good condition. No erythema, induration, or odor. No concern for infection. 05/26/2022: The wound is smaller today and is extremely clean, without  any slough buildup. The periwound skin is in much better condition and is no longer macerated. She is scheduled to complete her radiation treatment on Wednesday. 06/03/2022: The wound continues to contract. There is just a little bit of eschar and callus accumulation. The surface is clean. She has completed all of her neoadjuvant therapy and is awaiting surgery in November. 06/10/2022: The wound is smaller by about a centimeter in all dimensions. She does have a little bit of eschar near the Achilles portion of the wound. The wound surface itself is robust and healthy-appearing. 06/24/2022: The wound continues to contract. There is just a small portion open in the center. She does have some dry skin and periwound callus circumferentially. No concern for infection. 07/08/2022: The wound is down to just a pinhole. There is a little bit of eschar on the surface. No concern for infection. 07/22/2022: Her wound is healed. 08/08/2022: Unfortunately, it seems that when her wound was being dressed  at her last visit, the nurse applying the dressing pulled some dead skin off and reopened to the wound. I am not sure why this was not brought to my attention, but the patient has been caring for the wound at home with her supplies from her previous admission. She has been wearing her heel off loader and using Hydrofera Blue. There is some eschar accumulation, but no erythema, induration, or other concern for infection. Her colorectal surgery is scheduled for next Wednesday. 09/03/2022: Ms. Whigham returns after undergoing her colon surgery. She has been dressing the wound at home with [facility], but has not been wearing her heel cup. The wound is more open today with a lot of periwound callus, dry skin, and a light layer of slough. No overt signs of infection. 09/17/2022: The wound is little bit smaller today. There is some periwound tissue maceration. She says she has been wearing her heel cup more regularly. 10/01/2022: The wound looks a little smaller in terms of the open dimensions. There is more fresh epithelium present. The periwound skin is actually a bit dry and cracked with peeling skin. 10/16/2022: The periwound skin is extremely dry, thickened, and cracked. The wound is about the same size. There is slough on the wound surface. 10/30/2022: The periwound skin is dry but not nearly as thickened or cracked. The wound is a little bit smaller and more superficial. Minimal slough accumulation. She apparently was unable to get the endoform from the DME supplier so when she ran out of the leftovers from her clinic visit, she went back to using [facility]. 11/13/2022: There is a lot of dry skin around the wound. Otherwise the surface is clean. She has been approved for [employer]. 11/27/2022: The wound surface has robust granulation tissue and there is epithelium creeping around the perimeter. 12/11/2022: There is more epithelium filling in around the wound perimeter. 3/15; left calcaneus wounds  that we have been placing TheraSkin on. 2 small wounds divided by epithelization. 01/08/2023: There has been more epithelialization of the wound. There is some dry eschar on the surface of the larger more distal aspect and around the edges. 01/23/2023: Much of the wound has epithelialized. There are a couple of small open areas with beefy granulation tissue present. 02/06/2023: The silver collagen she has been using is sticking to the wound and its removal has torn away some of the epithelialized tissue, leaving the wound a bit bigger. 02/20/2023: She finally received the actual endoform about a week ago and has been using this,  rather than the collagen. There has been greater perimeter epithelialization and the wound surface has robust beefy granulation tissue. She does have an accumulation of callus around the margins. 03/06/2023: The wound looks about the best as I have ever seen it. There is beautiful granulation tissue on the surface. The periwound skin, although somewhat callused, is not peeling and flaking away. The entire wound is very clean. 6/7; refractory wound near the tip of the left heel. 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 The patient is changing the dressing herself. She has a heel offloading shoe. She is vigilant about keeping the pressure off this area even when she is in bed at night. She did not receive radiation 04/14/2023: The wound is a little bit larger today, but the entire surface has nice granulation tissue. No signs of infection. There is slough buildup. Her appointment with plastic surgery is 2 weeks from today. Patient History Information obtained from Patient. Social History Former smoker - 1991, Marital Status - Divorced, Alcohol Use - Never, Drug Use - No History, Caffeine Use - Moderate - Diet coke,coffee. Medical History Endocrine Patient has history of Type II Diabetes - Metformin pillsand Ozempic Musculoskeletal Patient has history of  Osteoarthritis Hospitalization/Surgery History - Melanoma excision to left heel;[facility] Left ankle fracture;[facility] - rectal surgery. Medical A Surgical History Notes nd Eyes Macular Degeneration Ear/Nose/Mouth/Throat Tinnitus Oncologic AUDIE, OPRY ([id number]) [id number] Page 7 of 10 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 has had Chemotherapy (stopped) and Radiation -8 treatments left as of 05/19/22 Rectal cancer Psychiatric Hx Depression Objective Constitutional no acute distress. Vitals Time Taken: 12:57 PM, Height: 63 in, Weight: 182 lbs, BMI: 32.2, Temperature: 98.3 F, Pulse: 80 bpm, Respiratory Rate: 18 breaths/min, Blood Pressure: 119/77 mmHg. Respiratory Normal work of breathing on room air. General Notes: 04/14/2023: The wound is a little bit larger today, but the entire surface has nice granulation tissue. No signs of infection. There is slough buildup. Integumentary (Hair, Skin) Wound #1 status is Open. Original cause of wound was Surgical Injury. The date acquired was: 05/05/2022. The wound has been in treatment 47 weeks. The wound is located on the Left Calcaneus. The wound measures 4cm length x 2.5cm width x 0.1cm depth; 7.854cm^2 area and 0.785cm^3 volume. There is Fat Layer (Subcutaneous Tissue) exposed. There is no tunneling or undermining noted. There is a medium amount of serosanguineous drainage noted. The wound margin is distinct with the outline attached to the wound base. There is large (67-100%) red, hyper - granulation within the wound bed. There is a small (1-33%) amount of necrotic tissue within the wound bed including Eschar. The periwound skin appearance had no abnormalities noted for moisture. The periwound skin appearance had no abnormalities noted for color. The periwound skin appearance exhibited: Callus. Periwound temperature was noted as No Abnormality. Assessment Active Problems ICD-10 Non-pressure chronic ulcer of left heel and midfoot with fat  layer exposed Disruption of external operation (surgical) wound, not elsewhere classified, sequela Malignant melanoma of skin, unspecified Malignant neoplasm of rectum Type 2 diabetes mellitus without complications Personal history of antineoplastic chemotherapy Personal history of irradiation Procedures Wound #1 Pre-procedure diagnosis of Wound #1 is a Diabetic Wound/Ulcer of the Lower Extremity located on the Left Calcaneus .Severity of Tissue Pre Debridement is: Fat layer exposed. There was a Selective/Open Wound Non-Viable Tissue Debridement with a total area of 7.85 sq cm performed by Duanne Guess, MD. With the following instrument(s): Curette to remove Non-Viable tissue/material. Material removed includes [facility] after achieving  pain control using Lidocaine 5% topical ointment. No specimens were taken. A time out was conducted at 13:06, prior to the start of the procedure. A Minimum amount of bleeding was controlled with Pressure. The procedure was tolerated well with a pain level of 0 throughout and a pain level of 0 following the procedure. Post Debridement Measurements: 4cm length x 2.5cm width x 0.1cm depth; 0.785cm^3 volume. Character of Wound/Ulcer Post Debridement is improved. Severity of Tissue Post Debridement is: Fat layer exposed. Post procedure Diagnosis Wound #1: Same as Pre-Procedure General Notes: Scribed for Dr. Lady [name] by J.Scotton. Plan Follow-up Appointments: Return Appointment in 2 weeks. - Dr. Lady [name] RM 2 ++Remember Plastic Surgery Appointment 05/05/23 Anesthetic: (In clinic) Topical Lidocaine 4% applied to wound bed Bathing/ Shower/ Hygiene: May shower and wash wound with soap and water. Edema Control - Lymphedema / SCD / Other: Avoid standing for long periods of time. Moisturize legs daily. Off-LoadingLUTITIA, RANCE ([id number]) [id number] Page 8 of 10 Other: - Please wear off-loading shoe on left foot to keep pressure off  the heel WOUND #1: - Calcaneus Wound Laterality: Left Cleanser: Normal Saline (Generic) Every Other Day/30 Days Discharge Instructions: Cleanse the wound with Normal Saline prior to applying a clean dressing using gauze sponges, not tissue or cotton balls. Cleanser: Soap and Water Every Other Day/30 Days Discharge Instructions: May shower and wash wound with dial antibacterial soap and water prior to dressing change. Cleanser: Wound Cleanser Every Other Day/30 Days Discharge Instructions: Cleanse the wound with wound cleanser prior to applying a clean dressing using gauze sponges, not tissue or cotton balls. Topical: Mupirocin Ointment Every Other Day/30 Days Discharge Instructions: Apply Mupirocin (Bactroban) as instructed Prim Dressing: Endoform 2x2 in Every Other Day/30 Days ary Discharge Instructions: Moisten with saline Secondary Dressing: Woven Gauze Sponge, Non-Sterile 4x4 in (Generic) Every Other Day/30 Days Discharge Instructions: Apply over primary dressing as directed. Secured With: [employer], 4.5x3.1 (in/yd) (Generic) Every Other Day/30 Days Discharge Instructions: Secure with Kerlix as directed. Secured With: 11M Medipore Scientist, research (life sciences) Surgical T 2x10 (in/yd) (Generic) Every Other Day/30 Days ape Discharge Instructions: Secure with tape as directed. 04/14/2023: The wound is a little bit larger today, but the entire surface has nice granulation tissue. No signs of infection. There is slough buildup. I used a curette to debride the slough from the wound. We simply have not been successful in getting any epithelialization to occur. I am hopeful that plastic surgery will consider her for a skin graft. In the meantime, we will continue to use topical mupirocin and endoform. Continue heel offloading shoe. Follow-up in 2 weeks. Electronic Signature(s) Signed: 04/14/2023 2:18:05 PM By: Duanne Guess MD FACS Entered By: Duanne Guess on 04/14/2023  14:18:04 -------------------------------------------------------------------------------- HxROS Details Patient Name: Date of Service: Destiny Kindred F. 04/14/2023 1:00 PM Medical Record Number: [id number] Patient Account Number: [id number] Date of Birth/Sex: Treating RN: [birth year] (77 y.o. F) Primary Care Provider: Merri Brunette Other Clinician: Referring Provider: Treating Provider/Extender: Willey Blade in Treatment: 62 Information Obtained From Patient Eyes Medical History: Past Medical History Notes: Macular Degeneration Ear/Nose/Mouth/Throat Medical History: Past Medical History Notes: Tinnitus Endocrine Medical History: Positive for: Type II Diabetes - Metformin pillsand Ozempic Time with diabetes: 25 years Treated with: Oral agents Blood sugar tested every day: Yes Tested : every day Musculoskeletal Medical History: Positive for: Osteoarthritis Oncologic CHARRISSE, CH ([id number]) [id number] Page 9 of 10 Medical History: Past Medical History Notes: 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 has had Chemotherapy (stopped)  and Radiation -8 treatments left as of 05/19/22 Rectal cancer Psychiatric Medical History: Past Medical History Notes: Hx Depression Immunizations Pneumococcal Vaccine: Received Pneumococcal Vaccination: Yes Received Pneumococcal Vaccination On or After 60th Birthday: Yes Implantable Devices None Hospitalization / Surgery History Type of Hospitalization/Surgery Melanoma excision to left heel;[facility] Left ankle fracture; rectal surgery Family and Social History Former smoker - 1991; Marital Status - Divorced; Alcohol Use: Never; Drug Use: No History; Caffeine Use: Moderate - Diet coke,coffee; Financial Concerns: No; Food, Clothing or Shelter Needs: No; Support System Lacking: No; Transportation Concerns: No Electronic Signature(s) Signed: 04/14/2023 4:21:51 PM By: Duanne Guess MD FACS Entered By: Duanne Guess on 04/14/2023 14:16:30 -------------------------------------------------------------------------------- SuperBill Details Patient Name: Date of Service: Earvin Hansen 04/14/2023 Medical Record Number: [id number] Patient Account Number: [id number] Date of Birth/Sex: Treating RN: January 07, 1946 (77 y.o. F) Primary Care Provider: Merri Brunette Other Clinician: Referring Provider: Treating Provider/Extender: Willey Blade in Treatment: 47 Diagnosis Coding ICD-10 Codes Code Description [phone] Non-pressure chronic ulcer of left heel and midfoot with fat layer exposed T81.31XS Disruption of external operation (surgical) wound, not elsewhere classified, sequela C43.9 Malignant melanoma of skin, unspecified C20 Malignant neoplasm of rectum E11.9 Type 2 diabetes mellitus without complications Z92.21 Personal history of antineoplastic chemotherapy Z92.3 Personal history of irradiation Facility Procedures : CPT4 Code: 95284132 Description: [phone] - DEBRIDE WOUND 1ST 20 SQ CM OR < ICD-10 Diagnosis Description L97.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 Modifier: Quantity: 1 Physician Procedures : CPT4 Code Description Modifier DANIELL, HOUSEWRIGHT ([id number]) [id number] - WC PHYS LEVEL 4 - EST PT 25 1 ICD-10 Diagnosis Description L97.422 Non-pressure chronic ulcer of left heel and midfoot with fat layer  exposed T81.31XS Disruption of external operation (surgical) wound, not elsewhere classified, sequela C43.9 Malignant melanoma of skin, unspecified E11.9 Type 2 diabetes mellitus without complications Quantity: 7.pdf Page 10 of 10 : [id number] - WC PHYS DEBR WO ANESTH 20 SQ CM 1 ICD-10 Diagnosis Description L97.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 Quantity: Electronic Signature(s) Signed: 04/14/2023 2:18:28 PM By: Duanne Guess MD FACS Entered  By: Duanne Guess on 04/14/2023 14:18:28

## 2023-04-23 DIAGNOSIS — E785 Hyperlipidemia, unspecified: Secondary | ICD-10-CM | POA: Diagnosis not present

## 2023-04-23 DIAGNOSIS — E039 Hypothyroidism, unspecified: Secondary | ICD-10-CM | POA: Diagnosis not present

## 2023-04-23 DIAGNOSIS — E559 Vitamin D deficiency, unspecified: Secondary | ICD-10-CM | POA: Diagnosis not present

## 2023-04-23 DIAGNOSIS — M810 Age-related osteoporosis without current pathological fracture: Secondary | ICD-10-CM | POA: Diagnosis not present

## 2023-04-23 DIAGNOSIS — E1140 Type 2 diabetes mellitus with diabetic neuropathy, unspecified: Secondary | ICD-10-CM | POA: Diagnosis not present

## 2023-04-23 DIAGNOSIS — E11610 Type 2 diabetes mellitus with diabetic neuropathic arthropathy: Secondary | ICD-10-CM | POA: Diagnosis not present

## 2023-04-29 NOTE — Progress Notes (Signed)
SANII, KUKLA ([id number]) [id number] Page 1 of 7 Visit Report for 04/14/2023 Arrival Information Details Patient Name: Date of Service: Destiny [name], Destiny [name] 04/14/2023 1:00 PM Medical Record Number: [id number] Patient Account Number: [id number] Date of Birth/Sex: Treating RN: 06-Jan-1946 (77 y.o. Katrinka Blazing Primary Care Hiromi Knodel: Merri Brunette Other Clinician: Referring Tavari Loadholt: Treating Maxmilian Trostel/Extender: Willey Blade in Treatment: 61 Visit Information History Since Last Visit Added or deleted any medications: No Patient Arrived: Ambulatory Any new allergies or adverse reactions: No Arrival Time: 12:56 Had a fall or experienced change in No Accompanied By: self activities of daily living that may affect Transfer Assistance: None risk of falls: Patient Identification Verified: Yes Signs or symptoms of abuse/neglect since last visito No Patient Requires Transmission-Based Precautions: No Hospitalized since last visit: No Patient Has Alerts: No Implantable device outside of the clinic excluding No cellular tissue based products placed in the center since last visit: Has Dressing in Place as Prescribed: Yes Pain Present Now: No Electronic Signature(s) Signed: 04/14/2023 5:05:24 PM By: Karie Schwalbe RN Entered By: Karie Schwalbe on 04/14/2023 12:56:36 -------------------------------------------------------------------------------- Encounter Discharge Information Details Patient Name: Date of Service: Destiny Kindred F. 04/14/2023 1:00 PM Medical Record Number: [id number] Patient Account Number: [id number] Date of Birth/Sex: Treating RN: Mar 01, 1946 (77 y.o. Katrinka Blazing Primary Care Cilicia Borden: Merri Brunette Other Clinician: Referring Deyona Soza: Treating Edilberto Roosevelt/Extender: Willey Blade in Treatment: 95 Encounter Discharge Information Items Post Procedure Vitals Discharge Condition:  Stable Temperature (F): 98.3 Ambulatory Status: Ambulatory Pulse (bpm): 80 Discharge Destination: Home Respiratory Rate (breaths/min): 18 Transportation: Private Auto Blood Pressure (mmHg): 119/77 Accompanied By: self Schedule Follow-up Appointment: Yes Clinical Summary of Care: Patient Declined Electronic Signature(s) Signed: 04/14/2023 5:05:24 PM By: Karie Schwalbe RN Entered By: Karie Schwalbe on 04/14/2023 16:52:27 Akard, Lenon Ahmadi ([id number]) [id number] Page 2 of 7 -------------------------------------------------------------------------------- Lower Extremity Assessment Details Patient Name: Date of Service: Destiny [name], Destiny [name] 04/14/2023 1:00 PM Medical Record Number: [id number] Patient Account Number: [id number] Date of Birth/Sex: Treating RN: [birth year] (77 y.o. Katrinka Blazing Primary Care Shereena Berquist: Merri Brunette Other Clinician: Referring Chaynce Schafer: Treating Ryszard Socarras/Extender: Willey Blade in Treatment: 47 Edema Assessment Assessed: Kyra Searles: No] Franne Forts: No] Edema: [Left: N] [Right: o] Calf Left: Right: Point of Measurement: 29 cm From Medial Instep 35 cm Ankle Left: Right: Point of Measurement: 10 cm From Medial Instep 21 cm Vascular Assessment Pulses: Dorsalis Pedis Palpable: [Left:Yes] Electronic Signature(s) Signed: 04/14/2023 5:05:24 PM By: Karie Schwalbe RN Entered By: Karie Schwalbe on 04/14/2023 13:02:50 -------------------------------------------------------------------------------- Multi Wound Chart Details Patient Name: Date of Service: Destiny Kindred F. 04/14/2023 1:00 PM Medical Record Number: [id number] Patient Account Number: [id number] Date of Birth/Sex: Treating RN: 1946-02-20 (77 y.o. F) Primary Care Ad Guttman: Merri Brunette Other Clinician: Referring Orvel Cutsforth: Treating Treysen Sudbeck/Extender: Willey Blade in Treatment: 47 Vital Signs Height(in): 63 Pulse(bpm):  80 Weight(lbs): 182 Blood Pressure(mmHg): 119/77 Body Mass Index(BMI): 32.2 Temperature(F): 98.3 Respiratory Rate(breaths/min): 18 [1:Photos:] [N/A:N/A] Left Calcaneus N/A N/A Wound Location: Surgical Injury N/A N/A Wounding Event: Diabetic Wound/Ulcer of the Lower N/A N/A Primary Etiology: Extremity Type II Diabetes, Osteoarthritis N/A N/A Comorbid History: 05/05/2022 N/A N/A Date Acquired: 32 N/A N/A Weeks of Treatment: Open N/A N/A Wound Status: No N/A N/A Wound Recurrence: 4x2.5x0.1 N/A N/A Measurements L x W x D (cm) 7.854 N/A N/A A (cm) : rea 0.785 N/A N/A Volume (cm) : -15.20% N/A N/A % Reduction in A rea: 42.40% N/A N/A % Reduction in Volume: Grade  1 N/A N/A Classification: Medium N/A N/A Exudate A mount: Serosanguineous N/A N/A Exudate Type: red, brown N/A N/A Exudate Color: Distinct, outline attached N/A N/A Wound Margin: Large (67-100%) N/A N/A Granulation A mount: Red, Hyper-granulation N/A N/A Granulation Quality: Small (1-33%) N/A N/A Necrotic A mount: Eschar N/A N/A Necrotic Tissue: Fat Layer (Subcutaneous Tissue): Yes N/A N/A Exposed Structures: Fascia: No Tendon: No Muscle: No Joint: No Bone: No Medium (34-66%) N/A N/A Epithelialization: Debridement - Selective/Open Wound N/A N/A Debridement: Pre-procedure Verification/Time Out 13:06 N/A N/A Taken: Lidocaine 5% topical ointment N/A N/A Pain Control: Slough N/A N/A Tissue Debrided: Non-Viable Tissue N/A N/A Level: 7.85 N/A N/A Debridement A (sq cm): rea Curette N/A N/A Instrument: Minimum N/A N/A Bleeding: Pressure N/A N/A Hemostasis A chieved: 0 N/A N/A Procedural Pain: 0 N/A N/A Post Procedural Pain: Procedure was tolerated well N/A N/A Debridement Treatment Response: 4x2.5x0.1 N/A N/A Post Debridement Measurements L x W x D (cm) 0.785 N/A N/A Post Debridement Volume: (cm) Callus: Yes N/A N/A Periwound Skin Texture: Dry/Scaly: Yes N/A N/A Periwound Skin  Moisture: Maceration: No No Abnormalities Noted N/A N/A Periwound Skin Color: No Abnormality N/A N/A Temperature: Debridement N/A N/A Procedures Performed: Treatment Notes Electronic Signature(s) Signed: 04/14/2023 2:13:32 PM By: Duanne Guess MD FACS Entered By: Duanne Guess on 04/14/2023 14:13:32 -------------------------------------------------------------------------------- Multi-Disciplinary Care Plan Details Patient Name: Date of Service: Destiny Kindred F. 04/14/2023 1:00 PM Medical Record Number: [id number] Patient Account Number: [id number] Date of Birth/Sex: Treating RN: 12-05-1945 (77 y.o. Katrinka Blazing Primary Care Shaniqwa Horsman: Merri Brunette Other Clinician: Referring Jorian [place]ho[place]: Treating Natoria Archibald/Extender: Willey Blade in Treatment: [address] Multidisciplinary Care Plan reviewed with physician [address] CYMONE, YESKE ([id number]) [id number] Page 4 of 7 Necrotic Tissue Nursing Diagnoses: Impaired tissue integrity related to necrotic/devitalized tissue Knowledge deficit related to management of necrotic/devitalized tissue Goals: Necrotic/devitalized tissue will be minimized in the wound bed Date Initiated: 08/08/2022 Target Resolution Date: 06/10/2023 Goal Status: Active Patient/caregiver will verbalize understanding of reason and process for debridement of necrotic tissue Date Initiated: 08/08/2022 Target Resolution Date: 06/10/2023 Goal Status: Active Interventions: Assess patient pain level pre-, during and post procedure and prior to discharge Provide education on necrotic tissue and debridement process Treatment Activities: Apply topical anesthetic as ordered : 08/08/2022 Notes: Electronic Signature(s) Signed: 04/14/2023 5:05:24 PM By: Karie Schwalbe RN Entered By: Karie Schwalbe on 04/14/2023 16:50:44 -------------------------------------------------------------------------------- Pain Assessment  Details Patient Name: Date of Service: Destiny Kindred F. 04/14/2023 1:00 PM Medical Record Number: [id number] Patient Account Number: [id number] Date of Birth/Sex: Treating RN: 16-May-1946 (77 y.o. Katrinka Blazing Primary Care Danya Spearman: Merri Brunette Other Clinician: Referring Laddie Math: Treating Candy Ziegler/Extender: Willey Blade in Treatment: 54 Active Problems Location of Pain Severity and Description of Pain Patient Has Paino No Site Locations Pain Management and Medication Current Pain Management: Electronic Signature(s) Signed: 04/14/2023 5:05:24 PM By: Karie Schwalbe RN Arredondo, Ardelle F ([id number]) By: Karie Schwalbe RN [phone].pdf Page 5 of 7 Signed: 04/14/2023 5:05:24 PM Entered By: Karie Schwalbe on 04/14/2023 12:57:52 -------------------------------------------------------------------------------- Patient/Caregiver Education Details Patient Name: Date of Service: Destiny [name], Destiny [name] 7/2/2024andnbsp1:00 PM Medical Record Number: [id number] Patient Account Number: [id number] Date of Birth/Gender: Treating RN: 1946/04/10 (77 y.o. Katrinka Blazing Primary Care Physician: Merri Brunette Other Clinician: Referring Physician: Treating Physician/Extender: Willey Blade in Treatment: 64 Education Assessment Education Provided To: Patient Education Topics Provided Wound/Skin Impairment: Methods: Explain/Verbal Responses: Return demonstration correctly Electronic Signature(s) Signed: 04/14/2023 5:05:24 PM By: Karie Schwalbe RN  Entered By: Karie Schwalbe on 04/14/2023 16:50:57 -------------------------------------------------------------------------------- Wound Assessment Details Patient Name: Date of Service: Destiny [name], Destiny [name] 04/14/2023 1:00 PM Medical Record Number: [id number] Patient Account Number: [id number] Date of Birth/Sex: Treating RN: 1946/09/02 (77 y.o. Gevena Mart Primary Care  Antavious Spanos: Merri Brunette Other Clinician: Referring Tedford Berg: Treating Keller Mikels/Extender: Willey Blade in Treatment: 47 Wound Status Wound Number: 1 Primary Etiology: Diabetic Wound/Ulcer of the Lower Extremity Wound Location: Left Calcaneus Wound Status: Open Wounding Event: Surgical Injury Comorbid History: Type II Diabetes, Osteoarthritis Date Acquired: 05/05/2022 Weeks Of Treatment: 47 Clustered Wound: No Photos Destiny [name], Destiny [name] ([id number]) [id number] Page 6 of 7 Wound Measurements Length: (cm) 4 Width: (cm) 2.5 Depth: (cm) 0.1 Area: (cm) 7.854 Volume: (cm) 0.785 % Reduction in Area: -15.2% % Reduction in Volume: 42.4% Epithelialization: Medium (34-66%) Tunneling: No Undermining: No Wound Description Classification: Grade 1 Wound Margin: Distinct, outline attached Exudate Amount: Medium Exudate Type: Serosanguineous Exudate Color: red, brown Foul Odor After Cleansing: No Slough/Fibrino No Wound Bed Granulation Amount: Large (67-100%) Exposed Structure Granulation Quality: Red, Hyper-granulation Fascia Exposed: No Necrotic Amount: Small (1-33%) Fat Layer (Subcutaneous Tissue) Exposed: Yes Necrotic Quality: Eschar Tendon Exposed: No Muscle Exposed: No Joint Exposed: No Bone Exposed: No Periwound Skin Texture Texture Color No Abnormalities Noted: No No Abnormalities Noted: Yes Callus: Yes Temperature / Pain Temperature: No Abnormality Moisture No Abnormalities Noted: Yes Treatment Notes Wound #1 (Calcaneus) Wound Laterality: Left Cleanser Normal Saline Discharge Instruction: Cleanse the wound with Normal Saline prior to applying a clean dressing using gauze sponges, not tissue or cotton balls. Soap and Water Discharge Instruction: May shower and wash wound with dial antibacterial soap and water prior to dressing change. Wound Cleanser Discharge Instruction: Cleanse the wound with wound cleanser prior to  applying a clean dressing using gauze sponges, not tissue or cotton balls. Peri-Wound Care Topical Mupirocin Ointment Discharge Instruction: Apply Mupirocin (Bactroban) as instructed Primary Dressing Endoform 2x2 in Discharge Instruction: Moisten with saline Secondary Dressing Woven Gauze Sponge, Non-Sterile 4x4 in Discharge Instruction: Apply over primary dressing as directed. Secured With [employer], 4.5x3.1 (in/yd) Discharge Instruction: Secure with Kerlix as directed. 73M Medipore Soft Cloth Surgical T 2x10 (in/yd) ape Discharge Instruction: Secure with tape as directed. Compression Wrap Compression Stockings Add-Ons Electronic Signature(s) Signed: 04/29/2023 7:59:22 AM By: Brenton Grills Entered By: Brenton Grills on 04/14/2023 12:59:44 Chiquita Loth ([id number]) [id number] Page 7 of 7 -------------------------------------------------------------------------------- Vitals Details Patient Name: Date of Service: Destiny [name], Destiny [name] 04/14/2023 1:00 PM Medical Record Number: [id number] Patient Account Number: [id number] Date of Birth/Sex: Treating RN: May 31, 1946 (77 y.o. Katrinka Blazing Primary Care Charity Tessier: Merri Brunette Other Clinician: Referring Nalayah Hitt: Treating Benecio Kluger/Extender: Willey Blade in Treatment: 47 Vital Signs Time Taken: 12:57 Temperature (F): 98.3 Height (in): 63 Pulse (bpm): 80 Weight (lbs): 182 Respiratory Rate (breaths/min): 18 Body Mass Index (BMI): 32.2 Blood Pressure (mmHg): 119/77 Reference Range: 80 - 120 mg / dl Electronic Signature(s) Signed: 04/14/2023 5:05:24 PM By: Karie Schwalbe RN Entered By: Karie Schwalbe on 04/14/2023 13:02:37

## 2023-05-05 ENCOUNTER — Ambulatory Visit: Payer: PPO | Admitting: Student

## 2023-05-05 ENCOUNTER — Institutional Professional Consult (permissible substitution): Payer: PPO | Admitting: Plastic Surgery

## 2023-05-05 ENCOUNTER — Encounter: Payer: Self-pay | Admitting: Student

## 2023-05-05 VITALS — BP 134/80 | HR 74

## 2023-05-05 DIAGNOSIS — S91302A Unspecified open wound, left foot, initial encounter: Secondary | ICD-10-CM

## 2023-05-05 DIAGNOSIS — Z85820 Personal history of malignant melanoma of skin: Secondary | ICD-10-CM

## 2023-05-05 NOTE — Progress Notes (Signed)
   Referring Provider Merri Brunette, MD [address] [place],  [place] [address]   CC:  Chief Complaint  Patient presents with   Follow-up      Destiny [name] is an 77 y.o. female.  HPI: Patient is a 77 year old female with history of reexcision of left heel melanoma performed on 05/22/2021.  She followed up with our office closely for wound management but was recommended she follow-up with outpatient wound care center approximately 1 year ago.  Patient presents to the clinic today as she was referred to [place] by wound care to see if skin graft was at all possible to the wound.  Today, patient reports she is doing well.  She states that she has been seeing wound care for about the past year and the wound cyclically improves and worsens.  She states that her team over at the wound care facility has tried many different wound healing products and interventions, but the wound is still present.  She states currently she is using endoform every other day on the wound.  Patient states she is no longer on chemotherapy for her rectal cancer.  She denies any other issues or concerns at this time.  Review of Systems General: Denies any fevers or chills  Physical Exam    05/05/2023    1:29 PM 03/02/2023    2:57 PM 09/01/2022    2:40 PM  Vitals with BMI  Height  5\' 3"    Weight  189 lbs 5 oz 185 lbs 13 oz  BMI  33.54 32.92  Systolic 134 134 604  Diastolic 80 84 84  Pulse 74 70 72    General:  No acute distress,  Alert and oriented, Non-Toxic, Normal speech and affect On exam, patient is sitting upright in no acute distress.  Wound to the left heel is approximately 6 x 2-1/2 cm.  There appears to be some unincorporated endoform noted superficially.  There otherwise is granulation tissue noted to the wound.  There is no surrounding erythema or drainage.  There are no signs of infection on exam.  Assessment/Plan  Wound of left foot   Dr. Ulice Bold had the opportunity to examine  the patient today.  It was discussed with the patient by Dr. Ulice Bold that a skin graft would skin graft would most likely fail.  Recommended patient return to wound care.  Also recommended she clean the wound with Vashe twice daily.  Patient expressed understanding.  Encourage patient to continue high-protein diet and to minimize carbs and sugars.  Patient to follow-up as needed.  Pictures were obtained of the patient and placed in the chart with the patient's or guardian's permission.   I instructed the patient to call if she has any questions or concerns about anything.  Laurena Spies 05/05/2023, 2:52 PM

## 2023-05-06 NOTE — Progress Notes (Signed)
Destiny [name], Destiny [name] ([id number]) [id number] Page 1 of 7 Visit Report for 03/20/2023 HPI Details Patient Name: Date of Service: Destiny [name], Destiny [name] 03/20/2023 12:45 PM Medical Record Number: [id number] Patient Account Number: [id number] Date of Birth/Sex: Treating RN: 01/10/1946 (77 y.o. 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 After the final reexcision, the wound had an ACell graft applied. She reports that she had good closure of skin over the site. Earlier this year, at the end of May, however, she was diagnosed with rectal cancer and is currently undergoing neoadjuvant therapy (chemotherapy and radiation) in anticipation of future surgery. She reports that about 2 weeks ago, she noted that her skin coverage over the heel was beginning to break down. Her oncologist felt that perhaps the Xeloda was contributing to wound failure and stopped this medication. She continues to receive neoadjuvant radiation therapy, however. She saw plastic surgery last week, and they referred her to the wound care center for further evaluation and management. She is a type II diabetic, well controlled, with the most recent A1c available to me being 5.9%. ABIs done 1 year ago were normal. On the patient's left heel, there is an irregular wound with some skin maceration. The fat layer is exposed and there is slough over the entire surface. Outside of the area of maceration, the skin is in good condition. No erythema, induration, or odor. No concern for infection. 05/26/2022: The wound is smaller today and is extremely clean, without any slough buildup. The periwound skin is in much better condition and is  no longer macerated. She is scheduled to complete her radiation treatment on Wednesday. 06/03/2022: The wound continues to contract. There is just a little bit of eschar and callus accumulation. The surface is clean. She has completed all of her neoadjuvant therapy and is awaiting surgery in November. 06/10/2022: The wound is smaller by about a centimeter in all dimensions. She does have a little bit of eschar near the Achilles portion of the wound. The wound surface itself is robust and healthy-appearing. 06/24/2022: The wound continues to contract. There is just a small portion open in the center. She does have some dry skin and periwound callus circumferentially. No concern for infection. 07/08/2022: The wound is down to just a pinhole. There is a little bit of eschar on the surface. No concern for infection. 07/22/2022: Her wound is healed. 08/08/2022: Unfortunately, it seems that when her wound was being dressed at her last visit, the nurse applying the dressing pulled some dead skin off and reopened to the wound. I am not sure why this was not brought to my attention, but the patient has been caring for the wound at home with her supplies from her previous admission. She has been wearing her heel off loader and using Hydrofera Blue. There is some eschar accumulation, but no erythema, induration, or other concern for infection. Her colorectal surgery is scheduled for next Wednesday. 09/03/2022: Ms. Boulos returns after undergoing her colon surgery. She has been dressing the wound at home with [facility], but has not been wearing her heel cup. The wound is more open today with a lot of periwound callus, dry skin, and a light layer of slough. No overt signs of infection. 09/17/2022: The wound is little bit smaller today.  There is some periwound tissue maceration. She says she has been wearing her heel cup more regularly. 10/01/2022: The wound looks a little smaller in terms of the open  dimensions. There is more fresh epithelium present. The periwound skin is actually a bit dry and cracked with peeling skin. 10/16/2022: The periwound skin is extremely dry, thickened, and cracked. The wound is about the same size. There is slough on the wound surface. 10/30/2022: The periwound skin is dry but not nearly as thickened or cracked. The wound is a little bit smaller and more superficial. Minimal slough accumulation. She apparently was unable to get the endoform from the DME supplier so when she ran out of the leftovers from her clinic visit, she went back to using [facility]. 11/13/2022: There is a lot of dry skin around the wound. Otherwise the surface is clean. She has been approved for [employer]. 11/27/2022: The wound surface has robust granulation tissue and there is epithelium creeping around the perimeter. 12/11/2022: There is more epithelium filling in around the wound perimeter. 3/15; left calcaneus wounds that we have been placing TheraSkin on. 2 small wounds divided by epithelization. 01/08/2023: There has been more epithelialization of the wound. There is some dry eschar on the surface of the larger more distal aspect and around the edges. 01/23/2023: Much of the wound has epithelialized. There are a couple of small open areas with beefy granulation tissue present. 02/06/2023: The silver collagen she has been using is sticking to the wound and its removal has torn away some of the epithelialized tissue, leaving the wound a bit bigger. 02/20/2023: She finally received the actual endoform about a week ago and has been using this, rather than the collagen. There has been greater perimeter epithelialization and the wound surface has robust beefy granulation tissue. She does have an accumulation of callus around the margins. Destiny [name], Destiny [name] ([id number]) [id number] Page 2 of 7 03/06/2023: The wound looks about the best as I have ever seen it. There is beautiful  granulation tissue on the surface. The periwound skin, although somewhat callused, is not peeling and flaking away. The entire wound is very clean. 6/7; refractory wound near the tip of the left heel. 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 The patient is changing the dressing herself. She has a heel offloading shoe. She is vigilant about keeping the pressure off this area even when she is in bed at night. She did not receive radiation Electronic Signature(s) Signed: 03/20/2023 4:34:22 PM By: Baltazar Najjar MD Entered By: Baltazar Najjar on 03/20/2023 13:01:46 -------------------------------------------------------------------------------- Chemical Cauterization Details Patient Name: Date of Service: Destiny [name] 03/20/2023 12:45 PM Medical Record Number: [id number] Patient Account Number: [id number] Date of Birth/Sex: Treating RN: [birth year] (77 y.o. F) Primary Care Provider: Merri Brunette Other Clinician: Referring Provider: Treating Provider/Extender: Shelly Rubenstein in Treatment: 27 Procedure Performed for: Wound #1 Left Calcaneus Performed By: Physician Maxwell Caul., MD Post Procedure Diagnosis Same as Pre-procedure Notes scribed for Dr. Leanord Hawking by Samuella Bruin, RN Electronic Signature(s) Signed: 03/20/2023 4:34:22 PM By: Baltazar Najjar MD Entered By: Baltazar Najjar on 03/20/2023 13:01:00 -------------------------------------------------------------------------------- Physical Exam Details Patient Name: Date of Service: Destiny [name] 03/20/2023 12:45 PM Medical Record Number: [id number] Patient Account Number: [id number] Date of Birth/Sex: Treating RN: 11-16-1945 (77 y.o. F) Primary Care Provider: Merri Brunette Other Clinician: Referring Provider: Treating Provider/Extender: Shelly Rubenstein in Treatment: 43 Notes Wound exam; somewhat nodular looking granulation however in  general it is healthy  red and appears ready to except new skin if we can stimulate this. Apparently dimensions have not been changing there is no erythema around the wound. I used silver nitrate to try and deal with some of the hyper granulated areas Electronic Signature(s) Signed: 03/20/2023 4:34:22 PM By: Baltazar Najjar MD Entered By: Baltazar Najjar on 03/20/2023 13:02:29 Skiver, Destiny [name] ([id number]) [id number] Page 3 of 7 -------------------------------------------------------------------------------- Physician Orders Details Patient Name: Date of Service: Destiny [name], Destiny [name] 03/20/2023 12:45 PM Medical Record Number: [id number] Patient Account Number: [id number] Date of Birth/Sex: Treating RN: [birth year] (77 y.o. Fredderick Phenix Primary Care Provider: Merri Brunette Other Clinician: Referring Provider: Treating Provider/Extender: Shelly Rubenstein in Treatment: 9 Verbal / Phone Orders: No Diagnosis Coding Follow-up Appointments ppointment in 2 weeks. - Dr. Lady Gary RM 2 Return A Anesthetic (In clinic) Topical Lidocaine 4% applied to wound bed Bathing/ Shower/ Hygiene May shower and wash wound with soap and water. Edema Control - Lymphedema / SCD / Other Avoid standing for long periods of time. Moisturize legs daily. Off-Loading Other: - Please wear off-loading shoe on left foot to keep pressure off the heel Wound Treatment Wound #1 - Calcaneus Wound Laterality: Left Cleanser: Normal Saline (Generic) Every Other Day/30 Days Discharge Instructions: Cleanse the wound with Normal Saline prior to applying a clean dressing using gauze sponges, not tissue or cotton balls. Cleanser: Soap and Water Every Other Day/30 Days Discharge Instructions: May shower and wash wound with dial antibacterial soap and water prior to dressing change. Cleanser: Wound Cleanser Every Other Day/30 Days Discharge Instructions: Cleanse the wound with wound cleanser prior to  applying a clean dressing using gauze sponges, not tissue or cotton balls. Topical: Mupirocin Ointment Every Other Day/30 Days Discharge Instructions: Apply Mupirocin (Bactroban) as instructed Prim Dressing: Endoform 2x2 in Every Other Day/30 Days ary Discharge Instructions: Moisten with saline Secondary Dressing: Woven Gauze Sponge, Non-Sterile 4x4 in (Generic) Every Other Day/30 Days Discharge Instructions: Apply over primary dressing as directed. Secured With: [employer], 4.5x3.1 (in/yd) (Generic) Every Other Day/30 Days Discharge Instructions: Secure with Kerlix as directed. Secured With: 6M [employer] Scientist, research (life sciences) Surgical T 2x10 (in/yd) (Generic) Every Other Day/30 Days ape Discharge Instructions: Secure with tape as directed. Patient Medications llergies: propofol, aspirin, atorvastatin, Lisinopril, hydrocodone, penicillin A Notifications Medication Indication Start End 03/20/2023 lidocaine DOSE topical 4 % cream - cream topical Electronic Signature(s) Signed: 03/20/2023 3:53:28 PM By: Samuella Bruin Signed: 03/20/2023 4:34:22 PM By: Baltazar Najjar MD Entered By: Samuella Bruin on 03/20/2023 12:58:54 Forcier, Destiny [name] ([id number]) [id number] Page 4 of 7 -------------------------------------------------------------------------------- Problem List Details Patient Name: Date of Service: Destiny [name], Destiny [name] 03/20/2023 12:45 PM Medical Record Number: [id number] Patient Account Number: [id number] Date of Birth/Sex: Treating RN: 12-12-1945 (77 y.o. F) Primary Care Provider: Merri Brunette Other Clinician: Referring Provider: Treating Provider/Extender: Shelly Rubenstein in Treatment: [phone] Active Problems ICD-10 Encounter Code Description Active Date MDM Diagnosis [phone] Non-pressure chronic ulcer of left heel and midfoot with fat layer exposed 05/19/2022 No Yes T81.31XS Disruption of external operation (surgical) wound, not  elsewhere classified, 05/19/2022 No Yes sequela C43.9 Malignant melanoma of skin, unspecified 05/19/2022 No Yes C20 Malignant neoplasm of rectum 05/19/2022 No Yes E11.9 Type 2 diabetes mellitus without complications 05/19/2022 No Yes Z92.21 Personal history of antineoplastic chemotherapy 05/19/2022 No Yes Z92.3 Personal history of irradiation 05/19/2022 No Yes Inactive Problems Resolved Problems Electronic Signature(s) Signed: 03/20/2023 4:34:22 PM By: Baltazar Najjar MD Entered By: Leanord Hawking,  Dionne Knoop on 03/20/2023 13:00:46 -------------------------------------------------------------------------------- Progress Note Details Patient Name: Date of Service: Destiny [name], Destiny [name] 03/20/2023 12:45 PM Medical Record Number: [id number] Patient Account Number: [id number] Date of Birth/Sex: Treating RN: [birth year] (76 y.o. 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 After the final reexcision, the wound had an ACell graft applied. She reports that she had good closure of skin over the site. Earlier this year, at the end of May, however, she was diagnosed with rectal cancer and is currently undergoing neoadjuvant therapy (chemotherapy and radiation) in anticipation of future surgery. She reports that about 2 weeks ago, she noted that her skin coverage over the heel was beginning to break down. Her oncologist felt that perhaps the Xeloda was contributing to wound failure and stopped this medication. She continues to receive neoadjuvant radiation therapy, however. She saw plastic surgery last week, and they referred her to the wound care center for  further evaluation and management. She is a type II diabetic, well controlled, with the most recent A1c available to me being 5.9%. ABIs done 1 year ago were normal. On the patient's left heel, there is an irregular wound with some skin maceration. The fat layer is exposed and there is slough over the entire surface. Outside of the area of maceration, the skin is in good condition. No erythema, induration, or odor. No concern for infection. 05/26/2022: The wound is smaller today and is extremely clean, without any slough buildup. The periwound skin is in much better condition and is no longer macerated. She is scheduled to complete her radiation treatment on Wednesday. 06/03/2022: The wound continues to contract. There is just a little bit of eschar and callus accumulation. The surface is clean. She has completed all of her neoadjuvant therapy and is awaiting surgery in November. 06/10/2022: The wound is smaller by about a centimeter in all dimensions. She does have a little bit of eschar near the Achilles portion of the wound. The wound surface itself is robust and healthy-appearing. 06/24/2022: The wound continues to contract. There is just a small portion open in the center. She does have some dry skin and periwound callus circumferentially. No concern for infection. 07/08/2022: The wound is down to just a pinhole. There is a little bit of eschar on the surface. No concern for infection. 07/22/2022: Her wound is healed. 08/08/2022: Unfortunately, it seems that when her wound was being dressed at her last visit, the nurse applying the dressing pulled some dead skin off and reopened to the wound. I am not sure why this was not brought to my attention, but the patient has been caring for the wound at home with her supplies from her previous admission. She has been wearing her heel off loader and using Hydrofera Blue. There is some eschar accumulation, but no erythema, induration, or other concern for  infection. Her colorectal surgery is scheduled for next Wednesday. 09/03/2022: Ms. Dalesandro returns after undergoing her colon surgery. She has been dressing the wound at home with [facility], but has not been wearing her heel cup. The wound is more open today with a lot of periwound callus, dry skin, and a light layer of slough. No overt signs of infection. 09/17/2022: The wound is little  bit smaller today. There is some periwound tissue maceration. She says she has been wearing her heel cup more regularly. 10/01/2022: The wound looks a little smaller in terms of the open dimensions. There is more fresh epithelium present. The periwound skin is actually a bit dry and cracked with peeling skin. 10/16/2022: The periwound skin is extremely dry, thickened, and cracked. The wound is about the same size. There is slough on the wound surface. 10/30/2022: The periwound skin is dry but not nearly as thickened or cracked. The wound is a little bit smaller and more superficial. Minimal slough accumulation. She apparently was unable to get the endoform from the DME supplier so when she ran out of the leftovers from her clinic visit, she went back to using [facility]. 11/13/2022: There is a lot of dry skin around the wound. Otherwise the surface is clean. She has been approved for [employer]. 11/27/2022: The wound surface has robust granulation tissue and there is epithelium creeping around the perimeter. 12/11/2022: There is more epithelium filling in around the wound perimeter. 3/15; left calcaneus wounds that we have been placing TheraSkin on. 2 small wounds divided by epithelization. 01/08/2023: There has been more epithelialization of the wound. There is some dry eschar on the surface of the larger more distal aspect and around the edges. 01/23/2023: Much of the wound has epithelialized. There are a couple of small open areas with beefy granulation tissue present. 02/06/2023: The silver collagen she has been  using is sticking to the wound and its removal has torn away some of the epithelialized tissue, leaving the wound a bit bigger. 02/20/2023: She finally received the actual endoform about a week ago and has been using this, rather than the collagen. There has been greater perimeter epithelialization and the wound surface has robust beefy granulation tissue. She does have an accumulation of callus around the margins. 03/06/2023: The wound looks about the best as I have ever seen it. There is beautiful granulation tissue on the surface. The periwound skin, although somewhat callused, is not peeling and flaking away. The entire wound is very clean. 6/7; refractory wound near the tip of the left heel. 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 The patient is changing the dressing herself. She has a heel offloading shoe. She is vigilant about keeping the pressure off this area even when she is in bed at night. She did not receive radiation Objective Constitutional Vitals Time Taken: 12:40 PM, Height: 63 in, Weight: 182 lbs, BMI: 32.2, Temperature: 98.4 F, Pulse: 75 bpm, Respiratory Rate: 18 breaths/min, Blood Pressure: 121/69 mmHg, Capillary Blood Glucose: 114 mg/dl. Destiny [name], Destiny [name] ([id number]) [id number] Page 6 of 7 Integumentary (Hair, Skin) Wound #1 status is Open. Original cause of wound was Hematoma. The date acquired was: 05/05/2022. The wound has been in treatment 43 weeks. The wound is located on the Left Calcaneus. The wound measures 3cm length x 2.2cm width x 0.1cm depth; 5.184cm^2 area and 0.518cm^3 volume. There is Fat Layer (Subcutaneous Tissue) exposed. There is no tunneling or undermining noted. There is a medium amount of serosanguineous drainage noted. The wound margin is distinct with the outline attached to the wound base. There is large (67-100%) red, hyper - granulation within the wound bed. There is a small (1-33%) amount of necrotic  tissue within the wound bed including Eschar. The periwound skin appearance had no abnormalities noted for moisture. The periwound skin appearance had no abnormalities noted for color. The periwound skin appearance exhibited:  Callus. Periwound temperature was noted as No Abnormality. Assessment Active Problems ICD-10 Non-pressure chronic ulcer of left heel and midfoot with fat layer exposed Disruption of external operation (surgical) wound, not elsewhere classified, sequela Malignant melanoma of skin, unspecified Malignant neoplasm of rectum Type 2 diabetes mellitus without complications Personal history of antineoplastic chemotherapy Personal history of irradiation Procedures Wound #1 Pre-procedure diagnosis of Wound #1 is a Diabetic Wound/Ulcer of the Lower Extremity located on the Left Calcaneus . An Chemical Cauterization procedure was performed by Maxwell Caul., MD. Post procedure Diagnosis Wound #1: Same as Pre-Procedure Notes: scribed for Dr. Leanord Hawking by Samuella Bruin, RN Plan Follow-up Appointments: Return Appointment in 2 weeks. - Dr. Lady Gary RM 2 Anesthetic: (In clinic) Topical Lidocaine 4% applied to wound bed Bathing/ Shower/ Hygiene: May shower and wash wound with soap and water. Edema Control - Lymphedema / SCD / Other: Avoid standing for long periods of time. Moisturize legs daily. Off-Loading: Other: - Please wear off-loading shoe on left foot to keep pressure off the heel The following medication(s) was prescribed: lidocaine topical 4 % cream cream topical was prescribed at facility WOUND #1: - Calcaneus Wound Laterality: Left Cleanser: Normal Saline (Generic) Every Other Day/30 Days Discharge Instructions: Cleanse the wound with Normal Saline prior to applying a clean dressing using gauze sponges, not tissue or cotton balls. Cleanser: Soap and Water Every Other Day/30 Days Discharge Instructions: May shower and wash wound with dial antibacterial soap and  water prior to dressing change. Cleanser: Wound Cleanser Every Other Day/30 Days Discharge Instructions: Cleanse the wound with wound cleanser prior to applying a clean dressing using gauze sponges, not tissue or cotton balls. Topical: Mupirocin Ointment Every Other Day/30 Days Discharge Instructions: Apply Mupirocin (Bactroban) as instructed Prim Dressing: Endoform 2x2 in Every Other Day/30 Days ary Discharge Instructions: Moisten with saline Secondary Dressing: Woven Gauze Sponge, Non-Sterile 4x4 in (Generic) Every Other Day/30 Days Discharge Instructions: Apply over primary dressing as directed. Secured With: [employer], 4.5x3.1 (in/yd) (Generic) Every Other Day/30 Days Discharge Instructions: Secure with Kerlix as directed. Secured With: 22M Med[employer] Scientist, research (life sciences) Surgical T 2x10 (in/yd) (Generic) Every Other Day/30 Days ape Discharge Instructions: Secure with tape as directed. 1. No change to the primary dressing 2. The patient complained that the normal saline was drying out. She is changing the dressing every second day I advised using K-Y jelly instead in the normal saline to moisten the endoform. 3. No evidence of infection 4. We talked about pressure relief but it sounds as though the patient is already on top of these issues. Electronic Signature(s) Destiny [name], Destiny [name] ([id number]) [id number] Page 7 of 7 Signed: 05/06/2023 10:24:24 AM By: Pearletha Alfred Signed: 05/06/2023 4:14:24 PM By: Baltazar Najjar MD Previous Signature: 03/20/2023 4:34:22 PM Version By: Baltazar Najjar MD Entered By: Pearletha Alfred on 05/06/2023 10:24:24 -------------------------------------------------------------------------------- SuperBill Details Patient Name: Date of Service: Destiny [name] 03/20/2023 Medical Record Number: [id number] Patient Account Number: [id number] Date of Birth/Sex: Treating RN: 1946/02/14 (77 y.o. F) Primary Care Provider: Merri Brunette Other  Clinician: Referring Provider: Treating Provider/Extender: Shelly Rubenstein in Treatment: 43 Diagnosis Coding ICD-10 Codes Code Description [phone] Non-pressure chronic ulcer of left heel and midfoot with fat layer exposed T81.31XS Disruption of external operation (surgical) wound, not elsewhere classified, sequela C43.9 Malignant melanoma of skin, unspecified C20 Malignant neoplasm of rectum E11.9 Type 2 diabetes mellitus without complications Z92.21 Personal history of antineoplastic chemotherapy Z92.3 Personal history of irradiation Facility Procedures : CPT4 Code: [id number]  Description: 17250 - CHEM CAUT GRANULATION TISS ICD-10 Diagnosis Description L97.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 Modifier: Quantity: 1 Physician Procedures : CPT4 Code Description Modifier [id number] - WC PHYS CHEM CAUT GRAN TISSUE ICD-10 Diagnosis Description L97.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 Quantity: 1 Electronic Signature(s) Signed: 03/20/2023 4:34:22 PM By: Baltazar Najjar MD Entered By: Baltazar Najjar on 03/20/2023 13:03:37

## 2023-05-07 ENCOUNTER — Encounter (HOSPITAL_BASED_OUTPATIENT_CLINIC_OR_DEPARTMENT_OTHER): Payer: PPO | Admitting: General Surgery

## 2023-05-11 ENCOUNTER — Other Ambulatory Visit (HOSPITAL_BASED_OUTPATIENT_CLINIC_OR_DEPARTMENT_OTHER): Payer: Self-pay | Admitting: General Surgery

## 2023-05-11 ENCOUNTER — Encounter (HOSPITAL_BASED_OUTPATIENT_CLINIC_OR_DEPARTMENT_OTHER): Payer: PPO | Admitting: General Surgery

## 2023-05-11 DIAGNOSIS — L97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: Secondary | ICD-10-CM | POA: Diagnosis not present

## 2023-05-11 DIAGNOSIS — L97412 Non-pressure chronic ulcer of right heel and midfoot with fat layer exposed: Secondary | ICD-10-CM | POA: Diagnosis not present

## 2023-05-11 DIAGNOSIS — C4372 Malignant melanoma of left lower limb, including hip: Secondary | ICD-10-CM | POA: Diagnosis not present

## 2023-05-11 DIAGNOSIS — E11621 Type 2 diabetes mellitus with foot ulcer: Secondary | ICD-10-CM | POA: Diagnosis not present

## 2023-05-11 NOTE — Progress Notes (Addendum)
ARJANAE, DELMEDICO ([id number]) [id number] Page 1 of 11 Visit Report for 05/11/2023 Biopsy Details Patient Name: Date of Service: Destiny [name], Destiny [name] 05/11/2023 1:00 PM Medical Record Number: [id number] Patient Account Number: [id number] Date of Birth/Sex: Treating RN: 01/29/1946 (77 y.o. Katrinka Blazing Primary Care Provider: Merri Brunette Other Clinician: Referring Provider: Treating Provider/Extender: Willey Blade in Treatment: 79 Biopsy Performed for: Wound #1 Left Calcaneus Location(s): Wound Bed Performed By: Physician Duanne Guess, MD Tissue Punch: Yes Size (mm): 5 Number of Specimens T aken: 1 Level of Consciousness (Pre-procedure): Awake and Alert Pre-procedure Verification/Time-Out Taken: Yes - 13:23 Pain Control: Lidocaine Injectable Lidocaine Percent: 1% Instrument: Forceps, Scissors Bleeding: Minimum Hemostasis Achieved: Silver Nitrate Procedural Pain: 4 Post Procedural Pain: 2 Response to Treatment: Procedure was tolerated well Level of Consciousness (Post-procedure): Awake and Alert Post Procedure Diagnosis Same as Pre-procedure Electronic Signature(s) Signed: 05/11/2023 2:13:52 PM By: Duanne Guess MD FACS Signed: 05/11/2023 6:18:19 PM By: Karie Schwalbe RN Previous Signature: 05/11/2023 1:42:26 PM Version By: Duanne Guess MD FACS Entered By: Karie Schwalbe on 05/11/2023 11:03:05 -------------------------------------------------------------------------------- Chief Complaint Document Details Patient Name: Date of Service: Destiny Kindred F. 05/11/2023 1:00 PM Medical Record Number: [id number] Patient Account Number: [id number] Date of Birth/Sex: Treating RN: [birth year] (77 y.o. F) Primary Care Provider: Merri Brunette Other Clinician: Referring Provider: Treating Provider/Extender: Willey Blade in Treatment: 51 Information Obtained from: Patient Chief Complaint Patient  presents to the wound care center with open non-healing surgical wound(s) Electronic Signature(s) Signed: 05/11/2023 1:42:34 PM By: Duanne Guess MD FACS Entered By: Duanne Guess on 05/11/2023 10:42:34 Chiquita Loth ([id number]) [id number] Page 2 of 11 -------------------------------------------------------------------------------- Debridement Details Patient Name: Date of Service: Destiny [name], Destiny [name] 05/11/2023 1:00 PM Medical Record Number: [id number] Patient Account Number: [id number] Date of Birth/Sex: Treating RN: 1945/11/09 (77 y.o. Katrinka Blazing Primary Care Provider: Merri Brunette Other Clinician: Referring Provider: Treating Provider/Extender: Willey Blade in Treatment: 51 Debridement Performed for Assessment: Wound #1 Left Calcaneus Performed By: Physician Duanne Guess, MD Debridement Type: Debridement Severity of Tissue Pre Debridement: Fat layer exposed Level of Consciousness (Pre-procedure): Awake and Alert Pre-procedure Verification/Time Out Yes - 13:23 Taken: Start Time: 13:23 Pain Control: Lidocaine 4% T opical Solution Percent of Wound Bed Debrided: 100% T Area Debrided (cm): otal 5.46 Tissue and other material debrided: Non-Viable, Slough, Slough Level: Non-Viable Tissue Debridement Description: Selective/Open Wound Instrument: Curette, Forceps, Scissors Bleeding: Minimum Hemostasis Achieved: Silver Nitrate End Time: 13:25 Procedural Pain: 0 Post Procedural Pain: 0 Response to Treatment: Procedure was tolerated well Level of Consciousness (Post- Awake and Alert procedure): Post Debridement Measurements of Total Wound Length: (cm) 2.9 Width: (cm) 2.4 Depth: (cm) 0.1 Volume: (cm) 0.547 Character of Wound/Ulcer Post Debridement: Improved Severity of Tissue Post Debridement: Fat layer exposed Post Procedure Diagnosis Same as Pre-procedure Notes Scribed for Dr. Lady Gary by  J.Scotton Punch Biopsy performed by Dr. Lady Gary, scribed by J.Scotton. Electronic Signature(s) Signed: 05/11/2023 2:13:52 PM By: Duanne Guess MD FACS Signed: 05/11/2023 6:18:19 PM By: Karie Schwalbe RN Previous Signature: 05/11/2023 1:41:06 PM Version By: Duanne Guess MD FACS Entered By: Karie Schwalbe on 05/11/2023 11:02:51 -------------------------------------------------------------------------------- HPI Details Patient Name: Date of Service: Destiny Kindred F. 05/11/2023 1:00 PM Medical Record Number: [id number] Patient Account Number: [id number] Date of Birth/Sex: Treating RN: 1946/08/22 (77 y.o. 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 After the final reexcision, the wound had an ACell graft applied. She reports that she had good closure of skin over the site. Earlier this year, at the end of May, however, she was diagnosed with rectal cancer and is currently undergoing neoadjuvant therapy (chemotherapy and radiation) in anticipation of future surgery. She reports that about 2 weeks ago, she noted that her skin coverage over the heel was beginning to break down. Her oncologist felt that perhaps the Xeloda was contributing to wound failure and stopped this medication. She continues to receive neoadjuvant radiation therapy, however. She saw plastic surgery last week, and they referred her to the wound care center for further evaluation and management. She is a type II diabetic, well controlled, with the most recent A1c available to me being 5.9%. ABIs done 1 year ago were normal. On the patient's left heel,  there is an irregular wound with some skin maceration. The fat layer is exposed and there is slough over the entire surface. Outside of the area of maceration, the skin is in good condition. No erythema, induration, or odor. No concern for infection. 05/26/2022: The wound is smaller today and is extremely clean, without any slough buildup. The periwound skin is in much better condition and is no longer macerated. She is scheduled to complete her radiation treatment on Wednesday. 06/03/2022: The wound continues to contract. There is just a little bit of eschar and callus accumulation. The surface is clean. She has completed all of her neoadjuvant therapy and is awaiting surgery in November. 06/10/2022: The wound is smaller by about a centimeter in all dimensions. She does have a little bit of eschar near the Achilles portion of the wound. The wound surface itself is robust and healthy-appearing. 06/24/2022: The wound continues to contract. There is just a small portion open in the center. She does have some dry skin and periwound callus circumferentially. No concern for infection. 07/08/2022: The wound is down to just a pinhole. There is a little bit of eschar on the surface. No concern for infection. 07/22/2022: Her wound is healed. 08/08/2022: Unfortunately, it seems that when her wound was being dressed at her last visit, the nurse applying the dressing pulled some dead skin off and reopened to the wound. I am not sure why this was not brought to my attention, but the patient has been caring for the wound at home with her supplies from her previous admission. She has been wearing her heel off loader and using Hydrofera Blue. There is some eschar accumulation, but no erythema, induration, or other concern for infection. Her colorectal surgery is scheduled for next Wednesday. 09/03/2022: Ms. Felmlee returns after undergoing her colon surgery. She has been dressing the wound at home with [facility],  but has not been wearing her heel cup. The wound is more open today with a lot of periwound callus, dry skin, and a light layer of slough. No overt signs of infection. 09/17/2022: The wound is little bit smaller today. There is some periwound tissue maceration. She says she has been wearing her heel cup more regularly. 10/01/2022: The wound looks a little smaller in terms of the open dimensions. There is more fresh epithelium present. The periwound skin is actually a bit dry and cracked with peeling skin. 10/16/2022: The periwound skin is extremely dry, thickened, and cracked. The wound  is about the same size. There is slough on the wound surface. 10/30/2022: The periwound skin is dry but not nearly as thickened or cracked. The wound is a little bit smaller and more superficial. Minimal slough accumulation. She apparently was unable to get the endoform from the DME supplier so when she ran out of the leftovers from her clinic visit, she went back to using [facility]. 11/13/2022: There is a lot of dry skin around the wound. Otherwise the surface is clean. She has been approved for [employer]. 11/27/2022: The wound surface has robust granulation tissue and there is epithelium creeping around the perimeter. 12/11/2022: There is more epithelium filling in around the wound perimeter. 3/15; left calcaneus wounds that we have been placing TheraSkin on. 2 small wounds divided by epithelization. 01/08/2023: There has been more epithelialization of the wound. There is some dry eschar on the surface of the larger more distal aspect and around the edges. 01/23/2023: Much of the wound has epithelialized. There are a couple of small open areas with beefy granulation tissue present. 02/06/2023: The silver collagen she has been using is sticking to the wound and its removal has torn away some of the epithelialized tissue, leaving the wound a bit bigger. 02/20/2023: She finally received the actual endoform about a week ago  and has been using this, rather than the collagen. There has been greater perimeter epithelialization and the wound surface has robust beefy granulation tissue. She does have an accumulation of callus around the margins. 03/06/2023: The wound looks about the best as I have ever seen it. There is beautiful granulation tissue on the surface. The periwound skin, although somewhat callused, is not peeling and flaking away. The entire wound is very clean. 6/7; refractory wound near the tip of the left heel. 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 The patient is changing the dressing herself. She has a heel offloading shoe. She is vigilant about keeping the pressure off this area even when she is in bed at night. She did not receive radiation 04/14/2023: The wound is a little bit larger today, but the entire surface has nice granulation tissue. No signs of infection. There is slough buildup. Her appointment with plastic surgery is 2 weeks from today. 05/11/2023: She saw plastic surgery last week and was informed that his skin graft would fail in this area and she was referred back to our clinic. The wound is about the same size but there is a bud of epithelium coming through the more proximal aspect of the wound. Friable and hypertrophic granulation tissue is present. Electronic Signature(s) Signed: 05/11/2023 1:43:43 PM By: Duanne Guess MD FACS Entered By: Duanne Guess on 05/11/2023 10:43:43 Grajeda, Lenon Ahmadi ([id number]) [id number] Page 4 of 11 -------------------------------------------------------------------------------- Physical Exam Details Patient Name: Date of Service: Destiny [name], Destiny [name] 05/11/2023 1:00 PM Medical Record Number: [id number] Patient Account Number: [id number] Date of Birth/Sex: Treating RN: [birth year] (77 y.o. F) Primary Care Provider: Merri Brunette Other Clinician: Referring Provider: Treating Provider/Extender: Willey Blade in Treatment: 51 Constitutional . . . . no acute distress. Respiratory Normal work of breathing on room air. Notes 05/11/2023: The wound is about the same size but there is a bud of epithelium coming through the more proximal aspect of the wound. Friable and hypertrophic granulation tissue is present. Electronic Signature(s) Signed: 05/11/2023 1:44:56 PM By: Duanne Guess MD FACS Entered By: Duanne Guess on 05/11/2023 10:44:56 -------------------------------------------------------------------------------- Physician Orders Details Patient Name: Date of Service:  Destiny [name], Destiny [name] 05/11/2023 1:00 PM Medical Record Number: [id number] Patient Account Number: [id number] Date of Birth/Sex: Treating RN: [birth year] (77 y.o. Katrinka Blazing Primary Care Provider: Merri Brunette Other Clinician: Referring Provider: Treating Provider/Extender: Willey Blade in Treatment: 6 Verbal / Phone Orders: No Diagnosis Coding ICD-10 Coding Code Description [phone] Non-pressure chronic ulcer of left heel and midfoot with fat layer exposed T81.31XS Disruption of external operation (surgical) wound, not elsewhere classified, sequela C43.9 Malignant melanoma of skin, unspecified C20 Malignant neoplasm of rectum E11.9 Type 2 diabetes mellitus without complications Z92.21 Personal history of antineoplastic chemotherapy Z92.3 Personal history of irradiation Follow-up Appointments Return appointment in 1 month. - Dr. Lady Gary Room 3 Thursday 06/11/23 at 1pm Anesthetic (In clinic) Topical Lidocaine 4% applied to wound bed Bathing/ Shower/ Hygiene May shower and wash wound with soap and water. Edema Control - Lymphedema / SCD / Other Avoid standing for long periods of time. Moisturize legs daily. Off-Loading Destiny [name], Destiny [name] ([id number]) [id number] Page 5 of 11 Other: - Please wear off-loading shoe on left foot to  keep pressure off the heel Additional Orders / Instructions Other: - Punch Biopsy taken 05/11/23. +++ Will notify you with results. Wound Treatment Wound #1 - Calcaneus Wound Laterality: Left Cleanser: Normal Saline (Generic) Every Other Day/30 Days Discharge Instructions: Cleanse the wound with Normal Saline prior to applying a clean dressing using gauze sponges, not tissue or cotton balls. Cleanser: Soap and Water Every Other Day/30 Days Discharge Instructions: May shower and wash wound with dial antibacterial soap and water prior to dressing change. Cleanser: Wound Cleanser Every Other Day/30 Days Discharge Instructions: Cleanse the wound with wound cleanser prior to applying a clean dressing using gauze sponges, not tissue or cotton balls. Topical: Mupirocin Ointment Every Other Day/30 Days Discharge Instructions: Apply Mupirocin (Bactroban) as instructed Prim Dressing: Endoform 2x2 in Every Other Day/30 Days ary Discharge Instructions: Moisten with saline Secondary Dressing: Woven Gauze Sponge, Non-Sterile 4x4 in (Generic) Every Other Day/30 Days Discharge Instructions: Apply over primary dressing as directed. Secured With: [employer], 4.5x3.1 (in/yd) (Generic) Every Other Day/30 Days Discharge Instructions: Secure with Kerlix as directed. Secured With: 18M [employer] Scientist, research (life sciences) Surgical T 2x10 (in/yd) (Generic) Every Other Day/30 Days ape Discharge Instructions: Secure with tape as directed. 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 Waymon Budge or Dr. Lenis Noon - (ICD10 [phone] - Non-pressure chronic ulcer of left heel and midfoot with fat layer exposed) Electronic Signature(s) Signed: 06/25/2023 11:20:38 AM By: Samuella Bruin Signed: 08/17/2023 4:14:53 PM By: Duanne Guess MD FACS Previous Signature: 05/11/2023 1:51:28 PM Version By: Duanne Guess MD FACS Entered By:  Samuella Bruin on 05/19/2023 07:22:53 Prescription 05/11/2023 -------------------------------------------------------------------------------- Chiquita Loth. Duanne Guess MD Patient Name: Provider: [birth year] [id number] Date of Birth: NPI#: [id number] Sex: DEA #: [id number] Phone #: License #: UPN: Patient Address: [phone] Eligha Bridegroom [facility] [place], [place] [address] 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 Waymon Budge or Dr. Loa Socks Signature: Date(s): Chiquita Loth ([id number]) [id number] Page 6 of 11 Electronic Signature(s) Signed: 06/25/2023 11:20:38 AM By: Samuella Bruin Signed: 08/17/2023 4:14:53 PM By: Duanne Guess MD FACS Entered By: Ander Slade  Ladona Ridgel on 05/19/2023 07:22:54 -------------------------------------------------------------------------------- Problem List Details Patient Name: Date of Service: Destiny [name], Destiny [name] 05/11/2023 1:00 PM Medical Record Number: [id number] Patient Account Number: [id number] Date of Birth/Sex: Treating RN: 04-01-1946 (77 y.o. 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 05/19/2022 No Yes T81.31XS Disruption of external operation (surgical) wound, not elsewhere classified, 05/19/2022 No Yes sequela C43.9 Malignant melanoma of skin, unspecified 05/19/2022 No Yes C20 Malignant neoplasm of rectum  05/19/2022 No Yes E11.9 Type 2 diabetes mellitus without complications 05/19/2022 No Yes Z92.21 Personal history of antineoplastic chemotherapy 05/19/2022 No Yes Z92.3 Personal history of irradiation 05/19/2022 No Yes Inactive Problems Resolved Problems Electronic Signature(s) Signed: 05/11/2023 1:40:16 PM By: Duanne Guess MD FACS Entered By: Duanne Guess on 05/11/2023 10:40:15 Chiquita Loth ([id number]) [id number] Page 7 of 11 -------------------------------------------------------------------------------- Progress Note Details Patient Name: Date of Service: Destiny [name], Destiny [name] 05/11/2023 1:00 PM Medical Record Number: [id number] Patient Account Number: [id number] Date of Birth/Sex: Treating RN: 1945-11-21 (77 y.o. 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 After the final reexcision, the wound had an ACell graft applied. She reports that she had good closure of skin over the site. Earlier this year, at the end of May, however, she was diagnosed with rectal cancer and is currently undergoing neoadjuvant therapy (chemotherapy and radiation) in anticipation of future surgery. She reports that about 2 weeks ago, she noted that her skin coverage over the heel was beginning to break down. Her oncologist felt that perhaps the Xeloda was contributing to wound failure and stopped this medication. She continues to receive neoadjuvant radiation therapy, however. She saw plastic surgery last week, and they referred her to the wound care center for  further evaluation and management. She is a type II diabetic, well controlled, with the most recent A1c available to me being 5.9%. ABIs done 1 year ago were normal. On the patient's left heel, there is an irregular wound with some skin maceration. The fat layer is exposed and there is slough over the entire surface. Outside of the area of maceration, the skin is in good condition. No erythema, induration, or odor. No concern for infection. 05/26/2022: The wound is smaller today and is extremely clean, without any slough buildup. The periwound skin is in much better condition and is no longer macerated. She is scheduled to complete her radiation treatment on Wednesday. 06/03/2022: The wound continues to contract. There is just a little bit of eschar and callus accumulation. The surface is clean. She has completed all of her neoadjuvant therapy and is awaiting surgery in November. 06/10/2022: The wound is smaller by about a centimeter in all dimensions. She does have a little bit of eschar near the Achilles portion of the wound. The wound surface itself is robust and healthy-appearing. 06/24/2022: The wound continues to contract. There is just a small portion open in the center. She does have some dry skin and periwound callus circumferentially. No concern for infection. 07/08/2022: The wound is  down to just a pinhole. There is a little bit of eschar on the surface. No concern for infection. 07/22/2022: Her wound is healed. 08/08/2022: Unfortunately, it seems that when her wound was being dressed at her last visit, the nurse applying the dressing pulled some dead skin off and reopened to the wound. I am not sure why this was not brought to my attention, but the patient has been caring for the wound at home with her supplies from her previous admission. She has been wearing her heel off loader and using Hydrofera Blue. There is some eschar accumulation, but no erythema, induration, or other concern for  infection. Her colorectal surgery is scheduled for next Wednesday. 09/03/2022: Ms. Niland returns after undergoing her colon surgery. She has been dressing the wound at home with [facility], but has not been wearing her heel cup. The wound is more open today with a lot of periwound callus, dry skin, and a light layer of slough. No overt signs of infection. 09/17/2022: The wound is little bit smaller today. There is some periwound tissue maceration. She says she has been wearing her heel cup more regularly. 10/01/2022: The wound looks a little smaller in terms of the open dimensions. There is more fresh epithelium present. The periwound skin is actually a bit dry and cracked with peeling skin. 10/16/2022: The periwound skin is extremely dry, thickened, and cracked. The wound is about the same size. There is slough on the wound surface. 10/30/2022: The periwound skin is dry but not nearly as thickened or cracked. The wound is a little bit smaller and more superficial. Minimal slough accumulation. She apparently was unable to get the endoform from the DME supplier so when she ran out of the leftovers from her clinic visit, she went back to using [facility]. 11/13/2022: There is a lot of dry skin around the wound. Otherwise the surface is clean. She has been approved for [employer]. 11/27/2022: The wound surface has robust granulation tissue and there is epithelium creeping around the perimeter. 12/11/2022: There is more epithelium filling in around the wound perimeter. 3/15; left calcaneus wounds that we have been placing TheraSkin on. 2 small wounds divided by epithelization. 01/08/2023: There has been more epithelialization of the wound. There is some dry eschar on the surface of the larger more distal aspect and around the edges. 01/23/2023: Much of the wound has epithelialized. There are a couple of small open areas with beefy granulation tissue present. 02/06/2023: The silver collagen she has been  using is sticking to the wound and its removal has torn away some of the epithelialized tissue, leaving the wound a bit bigger. 02/20/2023: She finally received the actual endoform about a week ago and has been using this, rather than the collagen. There has been greater perimeter epithelialization and the wound surface has robust beefy granulation tissue. She does have an accumulation of callus around the margins. 03/06/2023: The wound looks about the best as I have ever seen it. There is beautiful granulation tissue on the surface. The periwound skin, although somewhat callused, is not peeling and flaking away. The entire wound is very clean. 6/7; refractory wound near the tip of the left heel. 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 The patient is changing the dressing herself. She has a heel offloading shoe. She is vigilant about keeping the pressure off this area even when she is in bed at night. She did not receive radiation Destiny [name], Destiny [name] ([id number]) [id number] Page  8 of 11 04/14/2023: The wound is a little bit larger today, but the entire surface has nice granulation tissue. No signs of infection. There is slough buildup. Her appointment with plastic surgery is 2 weeks from today. 05/11/2023: She saw plastic surgery last week and was informed that his skin graft would fail in this area and she was referred back to our clinic. The wound is about the same size but there is a bud of epithelium coming through the more proximal aspect of the wound. Friable and hypertrophic granulation tissue is present. Patient History Information obtained from Patient. Social History Former smoker - 1991, Marital Status - Divorced, Alcohol Use - Never, Drug Use - No History, Caffeine Use - Moderate - Diet coke,coffee. Medical History Endocrine Patient has history of Type II Diabetes - Metformin pillsand Ozempic Musculoskeletal Patient has history of  Osteoarthritis Hospitalization/Surgery History - Melanoma excision to left heel;ORIF Left ankle fracture;[facility]land Kitchen - rectal surgery. Medical A Surgical History Notes nd Eyes Macular Degeneration Ear/Nose/Mouth/Throat Tinnitus Oncologic 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 has had Chemotherapy (stopped) and Radiation -8 treatments left as of 05/19/22 Rectal cancer Psychiatric Hx Depression Objective Constitutional no acute distress. Vitals Time Taken: 12:49 PM, Height: 63 in, Weight: 182 lbs, BMI: 32.2, Temperature: 98.7 F, Pulse: 79 bpm, Respiratory Rate: 16 breaths/min, Blood Pressure: 138/72 mmHg, Capillary Blood Glucose: 106 mg/dl. Respiratory Normal work of breathing on room air. General Notes: 05/11/2023: The wound is about the same size but there is a bud of epithelium coming through the more proximal aspect of the wound. Friable and hypertrophic granulation tissue is present. Integumentary (Hair, Skin) Wound #1 status is Open. Original cause of wound was Surgical Injury. The date acquired was: 05/05/2022. The wound has been in treatment 51 weeks. The wound is located on the Left Calcaneus. The wound measures 2.9cm length x 2.4cm width x 0.1cm depth; 5.466cm^2 area and 0.547cm^3 volume. There is Fat Layer (Subcutaneous Tissue) exposed. There is no tunneling or undermining noted. There is a medium amount of serosanguineous drainage noted. The wound margin is distinct with the outline attached to the wound base. There is large (67-100%) red, friable, hyper - granulation within the wound bed. There is a small (1- 33%) amount of necrotic tissue within the wound bed including Eschar and Adherent Slough. The periwound skin appearance had no abnormalities noted for moisture. The periwound skin appearance had no abnormalities noted for color. The periwound skin appearance exhibited: Callus. Periwound temperature was noted as No Abnormality. Assessment Active Problems ICD-10 Non-pressure chronic ulcer of left heel  and midfoot with fat layer exposed Disruption of external operation (surgical) wound, not elsewhere classified, sequela Malignant melanoma of skin, unspecified Malignant neoplasm of rectum Type 2 diabetes mellitus without complications Personal history of antineoplastic chemotherapy Personal history of irradiation Procedures Destiny [name], Destiny [name] ([id number]) [id number] Page 9 of 11 Wound #1 Pre-procedure diagnosis of Wound #1 is a Diabetic Wound/Ulcer of the Lower Extremity located on the Left Calcaneus .Severity of Tissue Pre Debridement is: Fat layer exposed. There was a Selective/Open Wound Non-Viable Tissue Debridement with a total area of 5.46 sq cm performed by Duanne Guess, MD. With the following instrument(s): Curette, Forceps, and Scissors to remove Non-Viable tissue/material. Material removed includes [facility] after achieving pain control using Lidocaine 4% Topical Solution. No specimens were taken. A time out was conducted at 13:23, prior to the start of the procedure. A Minimum amount of bleeding was controlled with Silver Nitrate. The procedure was tolerated well with a pain level of  0 throughout and a pain level of 0 following the procedure. Post Debridement Measurements: 2.9cm length x 2.4cm width x 0.1cm depth; 0.547cm^3 volume. Character of Wound/Ulcer Post Debridement is improved. Severity of Tissue Post Debridement is: Fat layer exposed. Post procedure Diagnosis Wound #1: Same as Pre-Procedure General Notes: Scribed for Dr. Lady Gary by J.Scotton Punch Biopsy performed by Dr. Lady Gary, scribed by J.Scotton.. Pre-procedure diagnosis of Wound #1 is a Diabetic Wound/Ulcer of the Lower Extremity located on the Left Calcaneus . There was a biopsy performed by Duanne Guess, MD. There was a biopsy performed on Wound Bed. The skin was cleansed and prepped with anti-septic followed by pain control using Lidocaine Injectable: 1%. Utilizing a 5 mm tissue punch,  tissue was removed at its base with the following instrument(s): Forceps and Scissors. A Minimum amount of bleeding was controlled with Silver Nitrate. A time out was conducted at 13:23, prior to the start of the procedure. The procedure was tolerated well with a pain level of 4 throughout and a pain level of 2 following the procedure. Post procedure Diagnosis Wound #1: Same as Pre-Procedure Plan Follow-up Appointments: Return appointment in 1 month. - Dr. Lady Gary Room 3 Thursday 06/11/23 at 1pm Anesthetic: (In clinic) Topical Lidocaine 4% applied to wound bed Bathing/ Shower/ Hygiene: May shower and wash wound with soap and water. Edema Control - Lymphedema / SCD / Other: Avoid standing for long periods of time. Moisturize legs daily. Off-Loading: Other: - Please wear off-loading shoe on left foot to keep pressure off the heel Additional Orders / Instructions: Other: - Punch Biopsy taken 05/11/23. +++ Will notify you with results. 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 Waymon Budge or Dr. Lenis Noon WOUND #1: - Calcaneus Wound Laterality: Left Cleanser: Normal Saline (Generic) Every Other Day/30 Days Discharge Instructions: Cleanse the wound with Normal Saline prior to applying a clean dressing using gauze sponges, not tissue or cotton balls. Cleanser: Soap and Water Every Other Day/30 Days Discharge Instructions: May shower and wash wound with dial antibacterial soap and water prior to dressing change. Cleanser: Wound Cleanser Every Other Day/30 Days Discharge Instructions: Cleanse the wound with wound cleanser prior to applying a clean dressing using gauze sponges, not tissue or cotton balls. Topical: Mupirocin Ointment Every Other Day/30 Days Discharge Instructions: Apply Mupirocin (Bactroban) as instructed Prim Dressing: Endoform 2x2 in Every Other Day/30 Days ary Discharge  Instructions: Moisten with saline Secondary Dressing: Woven Gauze Sponge, Non-Sterile 4x4 in (Generic) Every Other Day/30 Days Discharge Instructions: Apply over primary dressing as directed. Secured With: [employer], 4.5x3.1 (in/yd) (Generic) Every Other Day/30 Days Discharge Instructions: Secure with Kerlix as directed. Secured With: 77M Medipore Scientist, research (life sciences) Surgical T 2x10 (in/yd) (Generic) Every Other Day/30 Days ape Discharge Instructions: Secure with tape as directed. 05/11/2023: She saw plastic surgery last week and was informed that his skin graft would fail in this area and she was referred back to our clinic. The wound is about the same size but there is a bud of epithelium coming through the more proximal aspect of the wound. Friable and hypertrophic granulation tissue is present. I used a curette to debride some slough from the wound surface. Then, because the wound has failed to close and was initially the site of a melanoma, I elected to take a biopsy. 1% lidocaine with epinephrine was infiltrated into the wound tissue and periwound skin. A 5 mm punch biopsy was  then taken and sent to pathology for further evaluation. Hemostasis was achieved with silver nitrate and pressure. We will contact her when we have the results back from the biopsy. Given that we seem to have exhausted all of her other options, including having to use skin substitutes treated infection etc., she asked if we could extend her visits to once a month; I think this is reasonable. Electronic Signature(s) Signed: 06/25/2023 11:20:38 AM By: Samuella Bruin Signed: 08/17/2023 4:14:53 PM By: Duanne Guess MD FACS Previous Signature: 05/12/2023 6:21:10 PM Version By: Shawn Stall RN, BSN Previous Signature: 05/13/2023 7:48:01 AM Version By: Duanne Guess MD FACS Previous Signature: 05/11/2023 1:50:16 PM Version By: Duanne Guess MD FACS Entered By: Samuella Bruin on 05/19/2023 07:25:09 Destiny [name],  Lenon Ahmadi ([id number]) [id number] Page 10 of 11 -------------------------------------------------------------------------------- HxROS Details Patient Name: Date of Service: Destiny [name], Destiny [name] 05/11/2023 1:00 PM Medical Record Number: [id number] Patient Account Number: [id number] Date of Birth/Sex: Treating RN: 07/16/1946 (77 y.o. F) Primary Care Provider: Merri Brunette Other Clinician: Referring Provider: Treating Provider/Extender: Willey Blade in Treatment: 32 Information Obtained From Patient Eyes Medical History: Past Medical History Notes: Macular Degeneration Ear/Nose/Mouth/Throat Medical History: Past Medical History Notes: Tinnitus Endocrine Medical History: Positive for: Type II Diabetes - Metformin pillsand Ozempic Time with diabetes: 25 years Treated with: Oral agents Blood sugar tested every day: Yes Tested : every day Musculoskeletal Medical History: Positive for: Osteoarthritis Oncologic Medical History: Past Medical History Notes: 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 has had Chemotherapy (stopped) and Radiation -8 treatments left as of 05/19/22 Rectal cancer Psychiatric Medical History: Past Medical History Notes: Hx Depression Immunizations Pneumococcal Vaccine: Received Pneumococcal Vaccination: Yes Received Pneumococcal Vaccination On or After 60th Birthday: Yes Implantable Devices None Hospitalization / Surgery History Type of Hospitalization/Surgery Melanoma excision to left heel;ORIF Left ankle fracture; rectal surgery Family and Social History Former smoker - 1991; Marital Status - Divorced; Alcohol Use: Never; Drug Use: No History; Caffeine Use: Moderate - Diet coke,coffee; Financial Concerns: No; Food, Clothing or Shelter Needs: No; Support System Lacking: No; Transportation Concerns: No Electronic Signature(s) Signed: 05/11/2023 1:51:28 PM By: Duanne Guess MD FACS [place], [place] F [phone] By:  Duanne Guess MD FACS [phone].pdf Page 11 of 11 Signed: 05/11/2023 1:51:28 Entered By: Duanne Guess on 05/11/2023 10:44:18 -------------------------------------------------------------------------------- SuperBill Details Patient Name: Date of Service: Destiny [name], Destiny [name] 05/11/2023 Medical Record Number: [id number] Patient Account Number: [id number] Date of Birth/Sex: Treating RN: 1946/04/21 (77 y.o. F) Primary Care Provider: Merri Brunette Other Clinician: Referring Provider: Treating Provider/Extender: Willey Blade in Treatment: 51 Diagnosis Coding ICD-10 Codes Code Description 3101012869 Non-pressure chronic ulcer of left heel and midfoot with fat layer exposed T81.31XS Disruption of external operation (surgical) wound, not elsewhere classified, sequela C43.9 Malignant melanoma of skin, unspecified C20 Malignant neoplasm of rectum E11.9 Type 2 diabetes mellitus without complications Z92.21 Personal history of antineoplastic chemotherapy Z92.3 Personal history of irradiation Facility Procedures : CPT4 Code: 33295188 Description: 97597 - DEBRIDE WOUND 1ST 20 SQ CM OR < ICD-10 Diagnosis Description L97.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 Modifier: Quantity: 1 : CPT4 Code: 41660630 Description: 11104-Punch biopsy of skin (including simple closure, when performed) single lesion ICD-10 Diagnosis Description L97.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 Modifier: Quantity: 1 Physician Procedures : CPT4 Code Description Modifier [id number] 99213 - WC PHYS LEVEL 3 - EST PT 25 ICD-10 Diagnosis Description L97.422 Non-pressure chronic ulcer of left heel and  midfoot with fat layer exposed T81.31XS Disruption of external operation (surgical) wound, not  elsewhere classified, sequela C43.9 Malignant melanoma of skin, unspecified E11.9 Type 2 diabetes mellitus  without complications Quantity: 1 : [id number] 97597 - WC PHYS DEBR WO ANESTH 20 SQ CM ICD-10 Diagnosis Description L97.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 Quantity: 1 : 11104 Punch biopsy of skin (including simple closure, when performed) single lesion ICD-10 Diagnosis Description L97.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 Quantity: 1 Electronic Signature(s) Signed: 05/11/2023 1:50:59 PM By: Duanne Guess MD FACS Entered By: Duanne Guess on 05/11/2023 10:50:59

## 2023-05-12 NOTE — Progress Notes (Signed)
AKILI, DICKEN ([id number]) [id number] Page 1 of 7 Visit Report for 05/11/2023 Arrival Information Details Patient Name: Date of Service: Destiny [name], Destiny [name] 05/11/2023 1:00 PM Medical Record Number: [id number] Patient Account Number: [id number] Date of Birth/Sex: Treating RN: 25-Aug-1946 (77 y.o. Katrinka Blazing Primary Care Yarelin Reichardt: Merri Brunette Other Clinician: Referring Breuna Loveall: Treating Jemar Paulsen/Extender: Willey Blade in Treatment: 40 Visit Information History Since Last Visit Added or deleted any medications: No Patient Arrived: Ambulatory Any new allergies or adverse reactions: No Arrival Time: 12:49 Had a fall or experienced change in No Accompanied By: self activities of daily living that may affect Transfer Assistance: None risk of falls: Patient Identification Verified: Yes Signs or symptoms of abuse/neglect since last visito No Patient Requires Transmission-Based Precautions: No Hospitalized since last visit: No Patient Has Alerts: No Implantable device outside of the clinic excluding No cellular tissue based products placed in the center since last visit: Has Dressing in Place as Prescribed: Yes Pain Present Now: No Electronic Signature(s) Signed: 05/11/2023 6:18:19 PM By: Karie Schwalbe RN Entered By: Karie Schwalbe on 05/11/2023 12:50:19 -------------------------------------------------------------------------------- Encounter Discharge Information Details Patient Name: Date of Service: Destiny Kindred F. 05/11/2023 1:00 PM Medical Record Number: [id number] Patient Account Number: [id number] Date of Birth/Sex: Treating RN: 1946-04-15 (77 y.o. Katrinka Blazing Primary Care Staley Lunz: Merri Brunette Other Clinician: Referring Himmat Enberg: Treating Caral Whan/Extender: Willey Blade in Treatment: 54 Encounter Discharge Information Items Post Procedure Vitals Discharge Condition:  Stable Temperature (F): 98.7 Ambulatory Status: Ambulatory Pulse (bpm): 79 Discharge Destination: Home Respiratory Rate (breaths/min): 16 Transportation: Private Auto Blood Pressure (mmHg): 138/72 Accompanied By: self Schedule Follow-up Appointment: Yes Clinical Summary of Care: Patient Declined Electronic Signature(s) Signed: 05/11/2023 6:18:19 PM By: Karie Schwalbe RN Entered By: Karie Schwalbe on 05/11/2023 18:17:44 Kelliher, Lenon Ahmadi ([id number]) [id number] Page 2 of 7 -------------------------------------------------------------------------------- Lower Extremity Assessment Details Patient Name: Date of Service: Destiny [name] 05/11/2023 1:00 PM Medical Record Number: [id number] Patient Account Number: [id number] Date of Birth/Sex: Treating RN: 1946/03/25 (77 y.o. Katrinka Blazing Primary Care Chynna Buerkle: Merri Brunette Other Clinician: Referring Jelena Malicoat: Treating Latroya Ng/Extender: Willey Blade in Treatment: 51 Edema Assessment Assessed: Kyra Searles: No] Franne Forts: No] Edema: [Left: N] [Right: o] Calf Left: Right: Point of Measurement: 29 cm From Medial Instep 35 cm Ankle Left: Right: Point of Measurement: 10 cm From Medial Instep 21 cm Electronic Signature(s) Signed: 05/11/2023 6:18:19 PM By: Karie Schwalbe RN Entered By: Karie Schwalbe on 05/11/2023 12:52:44 -------------------------------------------------------------------------------- Multi Wound Chart Details Patient Name: Date of Service: Destiny Kindred F. 05/11/2023 1:00 PM Medical Record Number: [id number] Patient Account Number: [id number] Date of Birth/Sex: Treating RN: [birth year] (77 y.o. F) Primary Care Romolo Sieling: Merri Brunette Other Clinician: Referring Shainna Faux: Treating Zavannah Deblois/Extender: Willey Blade in Treatment: 51 Vital Signs Height(in): 63 Capillary Blood Glucose(mg/dl): 536 Weight(lbs): 644 Pulse(bpm): 79 Body Mass  Index(BMI): 32.2 Blood Pressure(mmHg): 138/72 Temperature(F): 98.7 Respiratory Rate(breaths/min): 16 [1:Photos:] [N/A:N/A] Left Calcaneus N/A N/A Wound Location: Surgical Injury N/A N/A Wounding Event: Diabetic Wound/Ulcer of the Lower N/A N/A Primary Etiology: Extremity Type II Diabetes, Osteoarthritis N/A N/A Comorbid History: 05/05/2022 N/A N/A Date AcquiredFATOUMATTA, [name] ([id number]) [id number] Page 3 of 7 51 N/A N/A Weeks of Treatment: Open N/A N/A Wound Status: No N/A N/A Wound Recurrence: 2.9x2.4x0.1 N/A N/A Measurements L x W x D (cm) 5.466 N/A N/A A (cm) : rea 0.547 N/A N/A Volume (cm) : 19.80% N/A N/A % Reduction in A rea: 59.90% N/A  N/A % Reduction in Volume: Grade 1 N/A N/A Classification: Medium N/A N/A Exudate A mount: Serosanguineous N/A N/A Exudate Type: red, brown N/A N/A Exudate Color: Distinct, outline attached N/A N/A Wound Margin: Large (67-100%) N/A N/A Granulation A mount: Red, Hyper-granulation, Friable N/A N/A Granulation Quality: Small (1-33%) N/A N/A Necrotic A mount: Eschar, Adherent Slough N/A N/A Necrotic Tissue: Fat Layer (Subcutaneous Tissue): Yes N/A N/A Exposed Structures: Fascia: No Tendon: No Muscle: No Joint: No Bone: No Medium (34-66%) N/A N/A Epithelialization: Debridement - Selective/Open Wound N/A N/A Debridement: Pre-procedure Verification/Time Out 13:03 N/A N/A Taken: Lidocaine 4% T opical Solution N/A N/A Pain Control: Slough N/A N/A Tissue Debrided: Non-Viable Tissue N/A N/A Level: 5.46 N/A N/A Debridement A (sq cm): rea Curette, Forceps, Scissors N/A N/A Instrument: Minimum N/A N/A Bleeding: Silver Nitrate N/A N/A Hemostasis A chieved: 0 N/A N/A Procedural Pain: 0 N/A N/A Post Procedural Pain: Procedure was tolerated well N/A N/A Debridement Treatment Response: 2.9x2.4x0.1 N/A N/A Post Debridement Measurements L x W x D (cm) 0.547 N/A N/A Post  Debridement Volume: (cm) Callus: Yes N/A N/A Periwound Skin Texture: Dry/Scaly: Yes N/A N/A Periwound Skin Moisture: Maceration: No No Abnormalities Noted N/A N/A Periwound Skin Color: No Abnormality N/A N/A Temperature: Debridement N/A N/A Procedures Performed: Treatment Notes Electronic Signature(s) Signed: 05/11/2023 1:40:23 PM By: Duanne Guess MD FACS Entered By: Duanne Guess on 05/11/2023 13:40:23 -------------------------------------------------------------------------------- Multi-Disciplinary Care Plan Details Patient Name: Date of Service: Destiny Kindred F. 05/11/2023 1:00 PM Medical Record Number: [id number] Patient Account Number: [id number] Date of Birth/Sex: Treating RN: June 02, 1946 (77 y.o. Katrinka Blazing Primary Care Ruffin Lada: Merri Brunette Other Clinician: Referring Anes Rigel: Treating Latasha Buczkowski/Extender: Willey Blade in Treatment: 27 Multidisciplinary Care Plan reviewed with physician Active Inactive Necrotic Tissue Nursing Diagnoses: Impaired tissue integrity related to necrotic/devitalized tissue DINAH, HAGELSTEIN ([id number]) [id number] Page 4 of 7 Knowledge deficit related to management of necrotic/devitalized tissue Goals: Necrotic/devitalized tissue will be minimized in the wound bed Date Initiated: 08/08/2022 Target Resolution Date: 07/11/2023 Goal Status: Active Patient/caregiver will verbalize understanding of reason and process for debridement of necrotic tissue Date Initiated: 08/08/2022 Target Resolution Date: 07/11/2023 Goal Status: Active Interventions: Assess patient pain level pre-, during and post procedure and prior to discharge Provide education on necrotic tissue and debridement process Treatment Activities: Apply topical anesthetic as ordered : 08/08/2022 Notes: Electronic Signature(s) Signed: 05/11/2023 6:18:19 PM By: Karie Schwalbe RN Entered By: Karie Schwalbe on  05/11/2023 18:16:41 -------------------------------------------------------------------------------- Pain Assessment Details Patient Name: Date of Service: Earvin Hansen. 05/11/2023 1:00 PM Medical Record Number: [id number] Patient Account Number: [id number] Date of Birth/Sex: Treating RN: [birth year] (77 y.o. Katrinka Blazing Primary Care Arvine Clayburn: Merri Brunette Other Clinician: Referring Shaunika Italiano: Treating Killian Schwer/Extender: Willey Blade in Treatment: 31 Active Problems Location of Pain Severity and Description of Pain Patient Has Paino No Site Locations Pain Management and Medication Current Pain Management: Electronic Signature(s) Signed: 05/11/2023 6:18:19 PM By: Karie Schwalbe RN Entered By: Karie Schwalbe on 05/11/2023 12:51:03 Yeo, Lenon Ahmadi ([id number]) [id number] Page 5 of 7 -------------------------------------------------------------------------------- Patient/Caregiver Education Details Patient Name: Date of Service: KELSHA, BURFORD 7/29/2024andnbsp1:00 PM Medical Record Number: [id number] Patient Account Number: [id number] Date of Birth/Gender: Treating RN: Apr 16, 1946 (77 y.o. Katrinka Blazing Primary Care Physician: Merri Brunette Other Clinician: Referring Physician: Treating Physician/Extender: Willey Blade in Treatment: 55 Education Assessment Education Provided To: Patient Education Topics Provided Wound/Skin Impairment: Methods: Explain/Verbal Responses: Return demonstration correctly Electronic Signature(s) Signed: 05/11/2023 6:18:19  PM By: Karie Schwalbe RN Entered By: Karie Schwalbe on 05/11/2023 18:16:53 -------------------------------------------------------------------------------- Wound Assessment Details Patient Name: Date of Service: AKSHATA, DICKINSON 05/11/2023 1:00 PM Medical Record Number: [id number] Patient Account Number: [id number] Date of  Birth/Sex: Treating RN: 1946-08-21 (77 y.o. Katrinka Blazing Primary Care Dalina Samara: Merri Brunette Other Clinician: Referring Georgeana Oertel: Treating Siddhant Hashemi/Extender: Willey Blade in Treatment: 51 Wound Status Wound Number: 1 Primary Etiology: Diabetic Wound/Ulcer of the Lower Extremity Wound Location: Left Calcaneus Wound Status: Open Wounding Event: Surgical Injury Comorbid History: Type II Diabetes, Osteoarthritis Date Acquired: 05/05/2022 Weeks Of Treatment: 51 Clustered Wound: No Photos Wound Measurements Length: (cm) 2.9 Width: (cm) 2.4 Surgeon, Daejah F ([id number]) Depth: (cm) Area: (cm) Volume: (cm) % Reduction in Area: 19.8% % Reduction in Volume: 59.9% [id number] Page 6 of 7 0.1 Epithelialization: Medium (34-66%) 5.466 Tunneling: No 0.547 Undermining: No Wound Description Classification: Grade 1 Wound Margin: Distinct, outline attached Exudate Amount: Medium Exudate Type: Serosanguineous Exudate Color: red, brown Foul Odor After Cleansing: No Slough/Fibrino Yes Wound Bed Granulation Amount: Large (67-100%) Exposed Structure Granulation Quality: Red, Hyper-granulation, Friable Fascia Exposed: No Necrotic Amount: Small (1-33%) Fat Layer (Subcutaneous Tissue) Exposed: Yes Necrotic Quality: Eschar, Adherent Slough Tendon Exposed: No Muscle Exposed: No Joint Exposed: No Bone Exposed: No Periwound Skin Texture Texture Color No Abnormalities Noted: No No Abnormalities Noted: Yes Callus: Yes Temperature / Pain Temperature: No Abnormality Moisture No Abnormalities Noted: Yes Treatment Notes Wound #1 (Calcaneus) Wound Laterality: Left Cleanser Normal Saline Discharge Instruction: Cleanse the wound with Normal Saline prior to applying a clean dressing using gauze sponges, not tissue or cotton balls. Soap and Water Discharge Instruction: May shower and wash wound with dial antibacterial soap and water prior  to dressing change. Wound Cleanser Discharge Instruction: Cleanse the wound with wound cleanser prior to applying a clean dressing using gauze sponges, not tissue or cotton balls. Peri-Wound Care Topical Mupirocin Ointment Discharge Instruction: Apply Mupirocin (Bactroban) as instructed Primary Dressing Endoform 2x2 in Discharge Instruction: Moisten with saline Secondary Dressing Woven Gauze Sponge, Non-Sterile 4x4 in Discharge Instruction: Apply over primary dressing as directed. Secured With [employer], 4.5x3.1 (in/yd) Discharge Instruction: Secure with Kerlix as directed. 31M Medipore Soft Cloth Surgical T 2x10 (in/yd) ape Discharge Instruction: Secure with tape as directed. Compression Wrap Compression Stockings Add-Ons Electronic Signature(s) Signed: 05/11/2023 6:18:19 PM By: Karie Schwalbe RN Entered By: Karie Schwalbe on 05/11/2023 12:56:15 Chiquita Loth ([id number]) [id number] Page 7 of 7 -------------------------------------------------------------------------------- Vitals Details Patient Name: Date of Service: SHARAINE, LOOKABILL 05/11/2023 1:00 PM Medical Record Number: [id number] Patient Account Number: [id number] Date of Birth/Sex: Treating RN: 01/20/1946 (77 y.o. Katrinka Blazing Primary Care Mylik Pro: Merri Brunette Other Clinician: Referring Libia Fazzini: Treating Charl Wellen/Extender: Willey Blade in Treatment: 25 Vital Signs Time Taken: 12:49 Temperature (F): 98.7 Height (in): 63 Pulse (bpm): 79 Weight (lbs): 182 Respiratory Rate (breaths/min): 16 Body Mass Index (BMI): 32.2 Blood Pressure (mmHg): 138/72 Capillary Blood Glucose (mg/dl): 725 Reference Range: 80 - 120 mg / dl Electronic Signature(s) Signed: 05/11/2023 6:18:19 PM By: Karie Schwalbe RN Entered By: Karie Schwalbe on 05/11/2023 12:52:39

## 2023-05-14 DIAGNOSIS — M810 Age-related osteoporosis without current pathological fracture: Secondary | ICD-10-CM | POA: Diagnosis not present

## 2023-05-18 LAB — DERMATOLOGY PATHOLOGY

## 2023-05-22 DIAGNOSIS — C439 Malignant melanoma of skin, unspecified: Secondary | ICD-10-CM | POA: Diagnosis not present

## 2023-05-22 DIAGNOSIS — C4372 Malignant melanoma of left lower limb, including hip: Secondary | ICD-10-CM | POA: Diagnosis not present

## 2023-05-25 ENCOUNTER — Other Ambulatory Visit: Payer: Self-pay | Admitting: *Deleted

## 2023-05-25 DIAGNOSIS — C439 Malignant melanoma of skin, unspecified: Secondary | ICD-10-CM

## 2023-06-01 DIAGNOSIS — Z79899 Other long term (current) drug therapy: Secondary | ICD-10-CM | POA: Diagnosis not present

## 2023-06-01 DIAGNOSIS — C4372 Malignant melanoma of left lower limb, including hip: Secondary | ICD-10-CM | POA: Diagnosis not present

## 2023-06-02 ENCOUNTER — Inpatient Hospital Stay: Payer: PPO | Attending: Oncology | Admitting: Oncology

## 2023-06-02 VITALS — BP 140/72 | HR 73 | Temp 98.1°F | Resp 18 | Ht 63.0 in | Wt 191.7 lb

## 2023-06-02 DIAGNOSIS — C439 Malignant melanoma of skin, unspecified: Secondary | ICD-10-CM | POA: Diagnosis not present

## 2023-06-02 DIAGNOSIS — C4372 Malignant melanoma of left lower limb, including hip: Secondary | ICD-10-CM | POA: Diagnosis not present

## 2023-06-02 DIAGNOSIS — E119 Type 2 diabetes mellitus without complications: Secondary | ICD-10-CM | POA: Insufficient documentation

## 2023-06-02 DIAGNOSIS — Z9884 Bariatric surgery status: Secondary | ICD-10-CM | POA: Diagnosis not present

## 2023-06-02 DIAGNOSIS — Z85040 Personal history of malignant carcinoid tumor of rectum: Secondary | ICD-10-CM | POA: Insufficient documentation

## 2023-06-02 DIAGNOSIS — Z5112 Encounter for antineoplastic immunotherapy: Secondary | ICD-10-CM | POA: Diagnosis not present

## 2023-06-02 DIAGNOSIS — R591 Generalized enlarged lymph nodes: Secondary | ICD-10-CM | POA: Diagnosis not present

## 2023-06-02 NOTE — Progress Notes (Signed)
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 She was seen at the wound clinic on 05/11/2023.  The wound was noted to have a "butt of epithelium "at the proximal aspect.  Friable and hypertrophic granulation tissue was present.  A punch biopsy was obtained.  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  The biopsy is consistent with recurrence of melanoma.  She was referred to Dr. Lenis Noon.  Review of the pathology at [facility] confirmed malignant melanoma in the dermis without an epidermal connection. Dr. Lenis Noon noted lymphadenopathy underlying the previous left groin biopsy site.  An FNA biopsy was performed.  Pathology revealed "atypical cells ".  A PET scan was performed yesterday.  Dr. Lenis Noon recommends neoadjuvant immunotherapy.  An inguinal lymph node dissection can be considered after a course of systemic therapy.  Destiny [name] reports feeling well.  She reports the firm fullness in the left groin has been present since the melanoma excision in 2022.  She feels well.  The left heel lesion remains open.  Good appetite.  Review of systems: Positives-chronic firm fullness in the left groin, open wound at the left calcaneus A complete review of systems was otherwise negative  Objective:  Vital signs in last 24 hours:  Blood pressure (!) 140/72, pulse 73, temperature 98.1 F (36.7 C), temperature source Temporal, resp. rate 18, height 5\' 3"  (1.6 m), weight 191 lb 11.2 oz (87 kg), SpO2 100%.    HEENT: Neck without mass Lymphatics: No cervical, supraclavicular, axillary, right inguinal inguinal, epitrochlear, or popliteal nodes.  Firm approximately 5 cm mass superior deep to the left inguinal scar Resp: Lungs clear bilaterally Cardio: Regular rate and  rhythm GI: No hepatosplenomegaly, nontender, nodularity deep to and lateral to the upper abdomen transverse scar Vascular: No leg edema  Skin: Open 4-5 cm area of the left calcaneus with underlying thickening of the dermis and a central ulceration.  Medial to the main wound there is a 4-5 mm slightly raised lesion.  No nodularity at the left calf or thigh.   Lab Results:  Lab Results  Component Value Date   WBC 6.7 08/16/2022   HGB 8.5 (L) 08/16/2022   HCT 27.0 (L) 08/16/2022   MCV 95.4 08/16/2022   PLT 177 08/16/2022   NEUTROABS 2.6 06/06/2022    CMP  Lab Results  Component Value Date   NA 139 08/16/2022   K 3.7 08/16/2022   CL 102 08/16/2022   CO2 29 08/16/2022   GLUCOSE 121 (H) 08/16/2022   BUN 18 08/16/2022   CREATININE 0.96 08/16/2022   CALCIUM 8.2 (L) 08/16/2022   PROT 6.8 06/06/2022   ALBUMIN 4.2 06/06/2022   AST 13 (L) 06/06/2022   ALT 8 06/06/2022   ALKPHOS 44 06/06/2022   BILITOT 0.6 06/06/2022   GFRNONAA >60 08/16/2022   GFRAA >60 09/24/2017    Lab Results  Component Value Date   CEA 2.19 03/02/2023    Medications: I have reviewed the patient's current medications.   Assessment/Plan: Rectal cancer Colonoscopy 03/06/2022-nonobstructing mass at 12 cm from the anal verge-biopsy invasive moderately differentiated adenocarcinoma arising within a tubular adenoma with high-grade dysplasia CTs 03/13/2022-no rectal mass seen, no evidence of metastatic disease, small 2-4 mm pulmonary nodules-at least 2 are calcified, likely benign granulomas, nonobstructing stone in  the interpolar right renal collecting system MRI pelvis 03/30/2022-tumor at 11-13 cm from the anal verge, T3b, early EMVI?,  No adenopathy Neoadjuvant radiation/capecitabine 04/21/2022-05/28/2022 Xeloda discontinued 05/15/2022 due to progressive breakdown/ulceration at the left heel. Low anterior resection 08/13/2022-negative for residual cancer,ypT0ypN0, 0/14 nodes margins negative, no distinct mass, 2.1 x  1.5 cm minimally ulcerated firm area of mucosa   2.   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 (reexcision 04/24/2021 and 05/22/2021, final margins clear,pT3b,pN0, ulceration present, satellitosis absent, lymphovascular invasion absent, neurotropism present, tumor infiltrating lymphocytes-nonbrisk, tumor regression absent Nonhealing left heel ulceration beginning August 2023, followed at the wound clinic Punch biopsy of left heel wound 05/11/2023-malignant melanoma in the dermis extending to the deep margins with no clear connection to the epidermis, consistent with local recurrence of melanoma FNA biopsy of left inguinal lymph node 05/21/2023-atypical cells present, SOX-10 negative   3.  Diabetes   4.  Laparoscopic gastric band surgery  5.  Left heel ulceration-progressive 05/15/2022.  Xeloda discontinued.  Followed at the wound clinic.     Disposition: Destiny [name] is in clinical remission from rectal cancer.  She has been diagnosed with recurrent melanoma.  She was originally diagnosed with melanoma involving a left heel lesion in June 2022.  There is a firm mass in the left groin.  An FNA biopsy of the left groin mass 05/21/2023 was nondiagnostic.  I discussed the diagnosis of recurrent melanoma and treatment options with Destiny [name].  She underwent a staging PET scan yesterday.  We do not have access to the images today.  The study has not been reported.  She saw Dr. Lenis Noon.  If there is tumor localized to the left heel and groin we may consider a course of neoadjuvant therapy prior to an inguinal lymph node dissection.  If the PET scan confirms distant metastatic disease there will be no role for surgery.  We will request the PET images be forwarded to [place].  We will request BRAF mutation testing on the 05/11/2023 skin biopsy.  We had a preliminary discussion regarding toxicities associated with immunotherapy.  This included a discussion of the potential for rash,  diarrhea, and various autoimmune toxicities.  Destiny [name] will return for an office visit and further discussion on 06/08/2023.  Approximately 40 minutes were spent with the patient today.  The majority the time was used for counseling and coordination of care.  Thornton Papas, MD  06/02/2023  2:08 PM

## 2023-06-03 ENCOUNTER — Encounter: Payer: Self-pay | Admitting: *Deleted

## 2023-06-03 ENCOUNTER — Inpatient Hospital Stay
Admission: RE | Admit: 2023-06-03 | Discharge: 2023-06-03 | Disposition: A | Discharge status: Home / Self Care | Payer: Self-pay | Source: Ambulatory Visit | Attending: Oncology | Admitting: Oncology

## 2023-06-03 ENCOUNTER — Other Ambulatory Visit: Payer: Self-pay | Admitting: *Deleted

## 2023-06-03 DIAGNOSIS — C439 Malignant melanoma of skin, unspecified: Secondary | ICD-10-CM

## 2023-06-03 NOTE — Progress Notes (Signed)
Request sent to [facility] for BRAF testing on accession number [id number]   Request faxed to [facility] for uploading of PET scan from 06/01/23

## 2023-06-08 ENCOUNTER — Inpatient Hospital Stay: Payer: PPO | Admitting: Oncology

## 2023-06-08 ENCOUNTER — Encounter: Payer: Self-pay | Admitting: Oncology

## 2023-06-08 VITALS — BP 138/71 | HR 73 | Temp 98.2°F | Resp 18 | Ht 63.0 in | Wt 191.1 lb

## 2023-06-08 DIAGNOSIS — Z5112 Encounter for antineoplastic immunotherapy: Secondary | ICD-10-CM | POA: Diagnosis not present

## 2023-06-08 DIAGNOSIS — C439 Malignant melanoma of skin, unspecified: Secondary | ICD-10-CM

## 2023-06-08 NOTE — Progress Notes (Signed)
[facility] OFFICE PROGRESS NOTE   Diagnosis: Melanoma  INTERVAL HISTORY:   Destiny [name] returns as scheduled.  She is here with her son.  No new complaint.  She feels well.  Objective:  Vital signs in last 24 hours:  Blood pressure 138/71, pulse 73, temperature 98.2 F (36.8 C), temperature source Oral, resp. rate 18, height 5\' 3"  (1.6 m), weight 191 lb 1.6 oz (86.7 kg), SpO2 100%.   Physical examination-not performed today  Lab Results:  Lab Results  Component Value Date   WBC 6.7 08/16/2022   HGB 8.5 (L) 08/16/2022   HCT 27.0 (L) 08/16/2022   MCV 95.4 08/16/2022   PLT 177 08/16/2022   NEUTROABS 2.6 06/06/2022    CMP  Lab Results  Component Value Date   NA 139 08/16/2022   K 3.7 08/16/2022   CL 102 08/16/2022   CO2 29 08/16/2022   GLUCOSE 121 (H) 08/16/2022   BUN 18 08/16/2022   CREATININE 0.96 08/16/2022   CALCIUM 8.2 (L) 08/16/2022   PROT 6.8 06/06/2022   ALBUMIN 4.2 06/06/2022   AST 13 (L) 06/06/2022   ALT 8 06/06/2022   ALKPHOS 44 06/06/2022   BILITOT 0.6 06/06/2022   GFRNONAA >60 08/16/2022   GFRAA >60 09/24/2017    Lab Results  Component Value Date   CEA 2.19 03/02/2023    Lab Results  Component Value Date   INR 1.0 03/26/2021   LABPROT 13.5 03/26/2021    Imaging:  No results found.  Medications: I have reviewed the patient's current medications.   Assessment/Plan: Rectal cancer Colonoscopy 03/06/2022-nonobstructing mass at 12 cm from the anal verge-biopsy invasive moderately differentiated adenocarcinoma arising within a tubular adenoma with high-grade dysplasia CTs 03/13/2022-no rectal mass seen, no evidence of metastatic disease, small 2-4 mm pulmonary nodules-at least 2 are calcified, likely benign granulomas, nonobstructing stone in the interpolar right renal collecting system MRI pelvis 03/30/2022-tumor at 11-13 cm from the anal verge, T3b, early EMVI?,  No adenopathy Neoadjuvant radiation/capecitabine  04/21/2022-05/28/2022 Xeloda discontinued 05/15/2022 due to progressive breakdown/ulceration at the left heel. Low anterior resection 08/13/2022-negative for residual cancer,ypT0ypN0, 0/14 nodes margins negative, no distinct mass, 2.1 x 1.5 cm minimally ulcerated firm area of mucosa   2.   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 (reexcision 04/24/2021 and 05/22/2021, final margins clear,pT3b,pN0, ulceration present, satellitosis absent, lymphovascular invasion absent, neurotropism present, tumor infiltrating lymphocytes-nonbrisk, tumor regression absent Nonhealing left heel ulceration beginning August 2023, followed at the wound clinic Punch biopsy of left heel wound 05/11/2023-malignant melanoma in the dermis extending to the deep margins with no clear connection to the epidermis, consistent with local recurrence of melanoma FNA biopsy of left inguinal lymph node 05/21/2023-atypical cells present, SOX-10 negative   3.  Diabetes   4.  Laparoscopic gastric band surgery  5.  Left heel ulceration-progressive 05/15/2022.  Xeloda discontinued.  Followed at the wound clinic.    Disposition: Destiny [name] has a history of rectal cancer and melanoma.  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  A staging PET scan on 06/01/2023 revealed increased SUV activity at the left heel resection site, a left popliteal lymph node, and the left groin mass.  Dr. Lenis Noon recommends neoadjuvant immunotherapy prior to a left inguinal lymph node dissection.  I sent a message to Dr. Lenis Noon to be sure he is in agreement with not performing a repeat left inguinal lymph node biopsy and proceeding with immunotherapy.  I  reviewed the PET findings and images with Destiny [name] and her son.  We discussed the plan for neoadjuvant immunotherapy prior to and resection of residual lymphadenopathy and disease at the left popliteal fossa and left heel.  We discussed  ipilimumab/nivolumab and pembrolizumab therapy.  We reviewed potential toxicities associated with these regimens including the chance of rash, diarrhea, and various autoimmune toxicities.  This included a discussion of development of neuropathy, myocarditis, pneumonitis, hepatitis, nephritis, diabetes, rheumatoid arthritis, and hypothyroidism.  She is uncomfortable with the potential increased toxicity associated with ipilimumab in combination with a PD-1 inhibitor.  She prefers a single agent PD-1 inhibitor.  I recommend pembrolizumab.  The plan is to begin treatment with pembrolizumab on 06/12/2023.  Destiny [name] will return for an office visit prior to cycle 2 pembrolizumab.  She will receive 3-4 treatments with pembrolizumab prior to a restaging evaluation.  A treatment plan was entered today.  Thornton Papas, MD  06/08/2023  4:18 PM

## 2023-06-08 NOTE — Progress Notes (Signed)
START ON PATHWAY REGIMEN - Melanoma and Other Skin Cancers     A cycle is every 21 days:     Pembrolizumab   **Always confirm dose/schedule in your pharmacy ordering system**  Patient Characteristics: Melanoma, Cutaneous/Unknown Primary, Local/In Transit/Nodal Recurrence, Potentially Resectable, Candidate for Neoadjuvant Immunotherapy Disease Classification: Melanoma Disease Subtype: Cutaneous BRAF V600 Mutation Status: Awaiting BRAF V600 Results Therapeutic Status: Local/In Transit/Nodal Recurrence Neoadjuvant Immunotherapy Candidate Status: Candidate for Neoadjuvant Immunotherapy Intent of Therapy: Curative Intent, Discussed with Patient

## 2023-06-09 ENCOUNTER — Other Ambulatory Visit: Payer: Self-pay

## 2023-06-11 ENCOUNTER — Ambulatory Visit (HOSPITAL_BASED_OUTPATIENT_CLINIC_OR_DEPARTMENT_OTHER): Payer: PPO | Admitting: General Surgery

## 2023-06-12 ENCOUNTER — Inpatient Hospital Stay: Payer: PPO

## 2023-06-12 ENCOUNTER — Encounter: Payer: Self-pay | Admitting: Oncology

## 2023-06-12 ENCOUNTER — Other Ambulatory Visit: Payer: Self-pay | Admitting: Oncology

## 2023-06-12 VITALS — BP 117/66 | HR 67 | Temp 97.9°F | Resp 18

## 2023-06-12 DIAGNOSIS — C439 Malignant melanoma of skin, unspecified: Secondary | ICD-10-CM

## 2023-06-12 DIAGNOSIS — Z5112 Encounter for antineoplastic immunotherapy: Secondary | ICD-10-CM | POA: Diagnosis not present

## 2023-06-12 LAB — CMP (CANCER CENTER ONLY)
ALT: 7 U/L (ref 0–44)
AST: 11 U/L — ABNORMAL LOW (ref 15–41)
Albumin: 4.3 g/dL (ref 3.5–5.0)
Alkaline Phosphatase: 79 U/L (ref 38–126)
Anion gap: 9 (ref 5–15)
BUN: 15 mg/dL (ref 8–23)
CO2: 26 mmol/L (ref 22–32)
Calcium: 9.2 mg/dL (ref 8.9–10.3)
Chloride: 104 mmol/L (ref 98–111)
Creatinine: 0.95 mg/dL (ref 0.44–1.00)
GFR, Estimated: >60 mL/min (ref >60)
Glucose, Bld: 93 mg/dL (ref 70–99)
Potassium: 4.1 mmol/L (ref 3.5–5.1)
Sodium: 139 mmol/L (ref 135–145)
Total Bilirubin: 0.4 mg/dL (ref 0.3–1.2)
Total Protein: 6.7 g/dL (ref 6.5–8.1)

## 2023-06-12 LAB — CBC WITH DIFFERENTIAL (CANCER CENTER ONLY)
Abs Immature Granulocytes: 0.01 10*3/uL (ref 0.00–0.07)
Basophils Absolute: 0 10*3/uL (ref 0.0–0.1)
Basophils Relative: 0 %
Eosinophils Absolute: 0.2 10*3/uL (ref 0.0–0.5)
Eosinophils Relative: 3 %
HCT: 39.3 % (ref 36.0–46.0)
Hemoglobin: 12.3 g/dL (ref 12.0–15.0)
Immature Granulocytes: 0 %
Lymphocytes Relative: 23 %
Lymphs Abs: 1.1 10*3/uL (ref 0.7–4.0)
MCH: 28.2 pg (ref 26.0–34.0)
MCHC: 31.3 g/dL (ref 30.0–36.0)
MCV: 90.1 fL (ref 80.0–100.0)
Monocytes Absolute: 0.4 10*3/uL (ref 0.1–1.0)
Monocytes Relative: 7 %
Neutro Abs: 3.4 10*3/uL (ref 1.7–7.7)
Neutrophils Relative %: 67 %
Platelet Count: 291 10*3/uL (ref 150–400)
RBC: 4.36 MIL/uL (ref 3.87–5.11)
RDW: 15.7 % — ABNORMAL HIGH (ref 11.5–15.5)
WBC Count: 5.1 10*3/uL (ref 4.0–10.5)
nRBC: 0 % (ref 0.0–0.2)

## 2023-06-12 LAB — TSH: TSH: 1.402 u[IU]/mL (ref 0.350–4.500)

## 2023-06-12 MED ORDER — SODIUM CHLORIDE 0.9 % IV SOLN
200.0000 mg | Freq: Once | INTRAVENOUS | Status: AC
  Administered 2023-06-12: 200 mg via INTRAVENOUS
  Filled 2023-06-12: qty 8

## 2023-06-12 MED ORDER — SODIUM CHLORIDE 0.9 % IV SOLN: Freq: Once | INTRAVENOUS | Status: AC

## 2023-06-12 NOTE — Patient Instructions (Signed)
[facility]   Discharge Instructions: Thank you for choosing [facility] to provide your oncology and hematology care.   If you have a lab appointment with the [facility], please go directly to the [facility] and check in at the registration area.   Wear comfortable clothing and clothing appropriate for easy access to any Portacath or PICC line.   We strive to give you quality time with your provider. You may need to reschedule your appointment if you arrive late (15 or more minutes).  Arriving late affects you and other patients whose appointments are after yours.  Also, if you miss three or more appointments without notifying the office, you may be dismissed from the clinic at the provider's discretion.      For prescription refill requests, have your pharmacy contact our office and allow 72 hours for refills to be completed.    Today you received the following chemotherapy and/or immunotherapy agents Keytruda      To help prevent nausea and vomiting after your treatment, we encourage you to take your nausea medication as directed.  BELOW ARE SYMPTOMS THAT SHOULD BE REPORTED IMMEDIATELY: *FEVER GREATER THAN 100.4 F (38 C) OR HIGHER *CHILLS OR SWEATING *NAUSEA AND VOMITING THAT IS NOT CONTROLLED WITH YOUR NAUSEA MEDICATION *UNUSUAL SHORTNESS OF BREATH *UNUSUAL BRUISING OR BLEEDING *URINARY PROBLEMS (pain or burning when urinating, or frequent urination) *BOWEL PROBLEMS (unusual diarrhea, constipation, pain near the anus) TENDERNESS IN MOUTH AND THROAT WITH OR WITHOUT PRESENCE OF ULCERS (sore throat, sores in mouth, or a toothache) UNUSUAL RASH, SWELLING OR PAIN  UNUSUAL VAGINAL DISCHARGE OR ITCHING   Items with * indicate a potential emergency and should be followed up as soon as possible or go to the Emergency Department if any problems should occur.  Please show the CHEMOTHERAPY ALERT CARD or IMMUNOTHERAPY ALERT CARD at  check-in to the Emergency Department and triage nurse.  Should you have questions after your visit or need to cancel or reschedule your appointment, please contact [facility]  Dept: [phone]  and follow the prompts.  Office hours are 8:00 a.m. to 4:30 p.m. Monday - Friday. Please note that voicemails left after 4:00 p.m. may not be returned until the following business day.  We are closed weekends and major holidays. You have access to a nurse at all times for urgent questions. Please call the main number to the clinic Dept: [phone] and follow the prompts.   For any non-urgent questions, you may also contact your provider using MyChart. We now offer e-Visits for anyone 80 and older to request care online for non-urgent symptoms. For details visit [email or web address].   Also download the MyChart app! Go to the app store, search "MyChart", open the app, select [facility], and log in with your MyChart username and password.

## 2023-06-13 LAB — T4: T4, Total: 7.6 ug/dL (ref 4.5–12.0)

## 2023-06-27 ENCOUNTER — Other Ambulatory Visit: Payer: Self-pay | Admitting: Oncology

## 2023-07-02 ENCOUNTER — Encounter: Payer: Self-pay | Admitting: General Surgery

## 2023-07-03 ENCOUNTER — Inpatient Hospital Stay: Payer: PPO | Attending: Oncology

## 2023-07-03 ENCOUNTER — Inpatient Hospital Stay: Payer: PPO

## 2023-07-03 ENCOUNTER — Encounter: Payer: Self-pay | Admitting: Nurse Practitioner

## 2023-07-03 ENCOUNTER — Inpatient Hospital Stay (HOSPITAL_BASED_OUTPATIENT_CLINIC_OR_DEPARTMENT_OTHER): Payer: PPO | Admitting: Nurse Practitioner

## 2023-07-03 VITALS — BP 139/69 | HR 66 | Resp 18

## 2023-07-03 VITALS — BP 110/89 | HR 76 | Temp 98.1°F | Resp 18 | Ht 63.0 in | Wt 193.0 lb

## 2023-07-03 DIAGNOSIS — H43813 Vitreous degeneration, bilateral: Secondary | ICD-10-CM | POA: Diagnosis not present

## 2023-07-03 DIAGNOSIS — Z85048 Personal history of other malignant neoplasm of rectum, rectosigmoid junction, and anus: Secondary | ICD-10-CM | POA: Diagnosis not present

## 2023-07-03 DIAGNOSIS — H353132 Nonexudative age-related macular degeneration, bilateral, intermediate dry stage: Secondary | ICD-10-CM | POA: Diagnosis not present

## 2023-07-03 DIAGNOSIS — H26493 Other secondary cataract, bilateral: Secondary | ICD-10-CM | POA: Diagnosis not present

## 2023-07-03 DIAGNOSIS — E119 Type 2 diabetes mellitus without complications: Secondary | ICD-10-CM | POA: Diagnosis not present

## 2023-07-03 DIAGNOSIS — C439 Malignant melanoma of skin, unspecified: Secondary | ICD-10-CM

## 2023-07-03 DIAGNOSIS — Z961 Presence of intraocular lens: Secondary | ICD-10-CM | POA: Diagnosis not present

## 2023-07-03 DIAGNOSIS — Z5112 Encounter for antineoplastic immunotherapy: Secondary | ICD-10-CM | POA: Diagnosis not present

## 2023-07-03 DIAGNOSIS — C4372 Malignant melanoma of left lower limb, including hip: Secondary | ICD-10-CM | POA: Diagnosis not present

## 2023-07-03 DIAGNOSIS — Z9884 Bariatric surgery status: Secondary | ICD-10-CM | POA: Insufficient documentation

## 2023-07-03 LAB — CMP (CANCER CENTER ONLY)
ALT: 7 U/L (ref 0–44)
AST: 11 U/L — ABNORMAL LOW (ref 15–41)
Albumin: 4.1 g/dL (ref 3.5–5.0)
Alkaline Phosphatase: 72 U/L (ref 38–126)
Anion gap: 8 (ref 5–15)
BUN: 13 mg/dL (ref 8–23)
CO2: 26 mmol/L (ref 22–32)
Calcium: 8.4 mg/dL — ABNORMAL LOW (ref 8.9–10.3)
Chloride: 108 mmol/L (ref 98–111)
Creatinine: 0.78 mg/dL (ref 0.44–1.00)
GFR, Estimated: >60 mL/min (ref >60)
Glucose, Bld: 81 mg/dL (ref 70–99)
Potassium: 3.5 mmol/L (ref 3.5–5.1)
Sodium: 142 mmol/L (ref 135–145)
Total Bilirubin: 0.5 mg/dL (ref 0.3–1.2)
Total Protein: 6.7 g/dL (ref 6.5–8.1)

## 2023-07-03 LAB — CBC WITH DIFFERENTIAL (CANCER CENTER ONLY)
Abs Immature Granulocytes: 0.02 10*3/uL (ref 0.00–0.07)
Basophils Absolute: 0 10*3/uL (ref 0.0–0.1)
Basophils Relative: 0 %
Eosinophils Absolute: 0.1 10*3/uL (ref 0.0–0.5)
Eosinophils Relative: 3 %
HCT: 38.8 % (ref 36.0–46.0)
Hemoglobin: 12.1 g/dL (ref 12.0–15.0)
Immature Granulocytes: 0 %
Lymphocytes Relative: 24 %
Lymphs Abs: 1.1 10*3/uL (ref 0.7–4.0)
MCH: 28.1 pg (ref 26.0–34.0)
MCHC: 31.2 g/dL (ref 30.0–36.0)
MCV: 90.2 fL (ref 80.0–100.0)
Monocytes Absolute: 0.3 10*3/uL (ref 0.1–1.0)
Monocytes Relative: 7 %
Neutro Abs: 3 10*3/uL (ref 1.7–7.7)
Neutrophils Relative %: 66 %
Platelet Count: 244 10*3/uL (ref 150–400)
RBC: 4.3 MIL/uL (ref 3.87–5.11)
RDW: 15.9 % — ABNORMAL HIGH (ref 11.5–15.5)
WBC Count: 4.6 10*3/uL (ref 4.0–10.5)
nRBC: 0 % (ref 0.0–0.2)

## 2023-07-03 MED ORDER — SODIUM CHLORIDE 0.9 % IV SOLN: Freq: Once | INTRAVENOUS | Status: AC

## 2023-07-03 MED ORDER — SODIUM CHLORIDE 0.9 % IV SOLN
200.0000 mg | Freq: Once | INTRAVENOUS | Status: AC
  Administered 2023-07-03: 200 mg via INTRAVENOUS
  Filled 2023-07-03: qty 8

## 2023-07-03 NOTE — Progress Notes (Signed)
[facility] OFFICE PROGRESS NOTE   Diagnosis: Melanoma  INTERVAL HISTORY:   Destiny [name] returns as scheduled.  She completed cycle 1 Pembrolizumab 06/12/2023.  No rash.  She has intermittent loose stools, not on a daily basis.  The night of the first treatment she developed "flulike" symptoms.  The next morning she noted the symptoms had resolved.  Yesterday she noted small bright spots in the visual field laterally involving the right eye.  No vision loss.  No pain.  Similar symptoms today but in a different location.  No unusual headaches.  Objective:  Vital signs in last 24 hours:  Blood pressure 110/89, pulse 76, temperature 98.1 F (36.7 C), temperature source Temporal, resp. rate 18, height 5\' 3"  (1.6 m), weight 193 lb (87.5 kg), SpO2 99%.    HEENT: Pupils equal round and reactive to light.  Extraocular movements intact.  Vision grossly intact. Lymphatics: Firm approximate 5 cm mass left inguinal region. Resp: Lungs clear bilaterally. Cardio: Regular rate and rhythm. GI: No hepatosplenomegaly. Vascular: No leg edema. Neuro: Alert and oriented.  Follows commands. Skin: No rash.   Lab Results:  Lab Results  Component Value Date   WBC 4.6 07/03/2023   HGB 12.1 07/03/2023   HCT 38.8 07/03/2023   MCV 90.2 07/03/2023   PLT 244 07/03/2023   NEUTROABS 3.0 07/03/2023    Imaging:  No results found.  Medications: I have reviewed the patient's current medications.  Assessment/Plan: Rectal cancer Colonoscopy 03/06/2022-nonobstructing mass at 12 cm from the anal verge-biopsy invasive moderately differentiated adenocarcinoma arising within a tubular adenoma with high-grade dysplasia CTs 03/13/2022-no rectal mass seen, no evidence of metastatic disease, small 2-4 mm pulmonary nodules-at least 2 are calcified, likely benign granulomas, nonobstructing stone in the interpolar right renal collecting system MRI pelvis 03/30/2022-tumor at 11-13 cm from the anal verge,  T3b, early EMVI?,  No adenopathy Neoadjuvant radiation/capecitabine 04/21/2022-05/28/2022 Xeloda discontinued 05/15/2022 due to progressive breakdown/ulceration at the left heel. Low anterior resection 08/13/2022-negative for residual cancer,ypT0ypN0, 0/14 nodes margins negative, no distinct mass, 2.1 x 1.5 cm minimally ulcerated firm area of mucosa   2.   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 (reexcision 04/24/2021 and 05/22/2021, final margins clear,pT3b,pN0, ulceration present, satellitosis absent, lymphovascular invasion absent, neurotropism present, tumor infiltrating lymphocytes-nonbrisk, tumor regression absent Nonhealing left heel ulceration beginning August 2023, followed at the wound clinic Punch biopsy of left heel wound 05/11/2023-malignant melanoma in the dermis extending to the deep margins with no clear connection to the epidermis, consistent with local recurrence of melanoma FNA biopsy of left inguinal lymph node 05/21/2023-atypical cells present, SOX-10 negative Cycle 1 Pembrolizumab 06/12/2023 Cycle 2 Pembrolizumab 07/03/2023   3.  Diabetes   4.  Laparoscopic gastric band surgery   5.  Left heel ulceration-progressive 05/15/2022.  Xeloda discontinued.  Followed at the wound clinic.  Disposition: Destiny [name] appears stable.  She has completed 1 cycle of Pembrolizumab.  Plan to proceed with cycle 2 today as scheduled.  CBC and chemistry panel reviewed.  Labs adequate to proceed as above.  She has noted small "bright spots" in the visual field of the right eye recently.  No other symptoms such as pain or vision loss.  Unlikely this is related to her treatment but it is possible.  She is comfortable proceeding with treatment today.  She will follow-up with ophthalmology.  She understands to seek urgent evaluation with eye pain or vision loss.  She will return for follow-up and treatment in  3 weeks.  We are available to see her sooner if needed.    Lonna Cobb ANP/GNP-BC   07/03/2023  10:11 AM

## 2023-07-03 NOTE — Progress Notes (Signed)
Patient seen by Lonna Cobb NP today  Vitals are within treatment parameters:Yes   Labs are within treatment parameters: Yes   Treatment plan has been signed: Yes   Per physician team, Patient is ready for treatment and there are NO modifications to the treatment plan.

## 2023-07-03 NOTE — Patient Instructions (Signed)
[facility]   Discharge Instructions: Thank you for choosing [facility] to provide your oncology and hematology care.   If you have a lab appointment with the Cancer Center, please go directly to the Cancer Center and check in at the registration area.   Wear comfortable clothing and clothing appropriate for easy access to any Portacath or PICC line.   We strive to give you quality time with your provider. You may need to reschedule your appointment if you arrive late (15 or more minutes).  Arriving late affects you and other patients whose appointments are after yours.  Also, if you miss three or more appointments without notifying the office, you may be dismissed from the clinic at the provider's discretion.      For prescription refill requests, have your pharmacy contact our office and allow 72 hours for refills to be completed.    Today you received the following chemotherapy and/or immunotherapy agents Pembrolizumab (KEYTRUDA).      To help prevent nausea and vomiting after your treatment, we encourage you to take your nausea medication as directed.  BELOW ARE SYMPTOMS THAT SHOULD BE REPORTED IMMEDIATELY: *FEVER GREATER THAN 100.4 F (38 C) OR HIGHER *CHILLS OR SWEATING *NAUSEA AND VOMITING THAT IS NOT CONTROLLED WITH YOUR NAUSEA MEDICATION *UNUSUAL SHORTNESS OF BREATH *UNUSUAL BRUISING OR BLEEDING *URINARY PROBLEMS (pain or burning when urinating, or frequent urination) *BOWEL PROBLEMS (unusual diarrhea, constipation, pain near the anus) TENDERNESS IN MOUTH AND THROAT WITH OR WITHOUT PRESENCE OF ULCERS (sore throat, sores in mouth, or a toothache) UNUSUAL RASH, SWELLING OR PAIN  UNUSUAL VAGINAL DISCHARGE OR ITCHING   Items with * indicate a potential emergency and should be followed up as soon as possible or go to the Emergency Department if any problems should occur.  Please show the CHEMOTHERAPY ALERT CARD or IMMUNOTHERAPY  ALERT CARD at check-in to the Emergency Department and triage nurse.  Should you have questions after your visit or need to cancel or reschedule your appointment, please contact [facility]  Dept: [phone]  and follow the prompts.  Office hours are 8:00 a.m. to 4:30 p.m. Monday - Friday. Please note that voicemails left after 4:00 p.m. may not be returned until the following business day.  We are closed weekends and major holidays. You have access to a nurse at all times for urgent questions. Please call the main number to the clinic Dept: [phone] and follow the prompts.   For any non-urgent questions, you may also contact your provider using MyChart. We now offer e-Visits for anyone 6 and older to request care online for non-urgent symptoms. For details visit [email or web address].   Also download the MyChart app! Go to the app store, search "MyChart", open the app, select [facility], and log in with your MyChart username and password.  Pembrolizumab Injection What is this medication? PEMBROLIZUMAB (PEM broe LIZ ue mab) treats some types of cancer. It works by helping your immune system slow or stop the spread of cancer cells. It is a monoclonal antibody. This medicine may be used for other purposes; ask your health care provider or pharmacist if you have questions. COMMON BRAND NAME(S): Keytruda What should I tell my care team before I take this medication? They need to know if you have any of these conditions: Allogeneic stem cell transplant (uses someone else's stem cells) Autoimmune diseases, such as Crohn disease, ulcerative colitis, lupus History of chest radiation Nervous  system problems, such as Guillain-Barre syndrome, myasthenia gravis Organ transplant An unusual or allergic reaction to pembrolizumab, other medications, foods, dyes, or preservatives Pregnant or trying to get pregnant Breast-feeding How should I use this  medication? This medication is injected into a vein. It is given by your care team in a hospital or clinic setting. A special MedGuide will be given to you before each treatment. Be sure to read this information carefully each time. Talk to your care team about the use of this medication in children. While it may be prescribed for children as young as 6 months for selected conditions, precautions do apply. Overdosage: If you think you have taken too much of this medicine contact a poison control center or emergency room at once. NOTE: This medicine is only for you. Do not share this medicine with others. What if I miss a dose? Keep appointments for follow-up doses. It is important not to miss your dose. Call your care team if you are unable to keep an appointment. What may interact with this medication? Interactions have not been studied. This list may not describe all possible interactions. Give your health care provider a list of all the medicines, herbs, non-prescription drugs, or dietary supplements you use. Also tell them if you smoke, drink alcohol, or use illegal drugs. Some items may interact with your medicine. What should I watch for while using this medication? Your condition will be monitored carefully while you are receiving this medication. You may need blood work while taking this medication. This medication may cause serious skin reactions. They can happen weeks to months after starting the medication. Contact your care team right away if you notice fevers or flu-like symptoms with a rash. The rash may be red or purple and then turn into blisters or peeling of the skin. You may also notice a red rash with swelling of the face, lips, or lymph nodes in your neck or under your arms. Tell your care team right away if you have any change in your eyesight. Talk to your care team if you may be pregnant. Serious birth defects can occur if you take this medication during pregnancy and for 4  months after the last dose. You will need a negative pregnancy test before starting this medication. Contraception is recommended while taking this medication and for 4 months after the last dose. Your care team can help you find the option that works for you. Do not breastfeed while taking this medication and for 4 months after the last dose. What side effects may I notice from receiving this medication? Side effects that you should report to your care team as soon as possible: Allergic reactions--skin rash, itching, hives, swelling of the face, lips, tongue, or throat Dry cough, shortness of breath or trouble breathing Eye pain, redness, irritation, or discharge with blurry or decreased vision Heart muscle inflammation--unusual weakness or fatigue, shortness of breath, chest pain, fast or irregular heartbeat, dizziness, swelling of the ankles, feet, or hands Hormone gland problems--headache, sensitivity to light, unusual weakness or fatigue, dizziness, fast or irregular heartbeat, increased sensitivity to cold or heat, excessive sweating, constipation, hair loss, increased thirst or amount of urine, tremors or shaking, irritability Infusion reactions--chest pain, shortness of breath or trouble breathing, feeling faint or lightheaded Kidney injury (glomerulonephritis)--decrease in the amount of urine, red or dark brown urine, foamy or bubbly urine, swelling of the ankles, hands, or feet Liver injury--right upper belly pain, loss of appetite, nausea, light-colored stool, dark yellow or  brown urine, yellowing skin or eyes, unusual weakness or fatigue Pain, tingling, or numbness in the hands or feet, muscle weakness, change in vision, confusion or trouble speaking, loss of balance or coordination, trouble walking, seizures Rash, fever, and swollen lymph nodes Redness, blistering, peeling, or loosening of the skin, including inside the mouth Sudden or severe stomach pain, bloody diarrhea, fever, nausea,  vomiting Side effects that usually do not require medical attention (report to your care team if they continue or are bothersome): Bone, joint, or muscle pain Diarrhea Fatigue Loss of appetite Nausea Skin rash This list may not describe all possible side effects. Call your doctor for medical advice about side effects. You may report side effects to FDA at 1-800-FDA-1088. Where should I keep my medication? This medication is given in a hospital or clinic. It will not be stored at home. NOTE: This sheet is a summary. It may not cover all possible information. If you have questions about this medicine, talk to your doctor, pharmacist, or health care provider.  2024 Elsevier/Gold Standard (2022-02-11 00:00:00)

## 2023-07-07 DIAGNOSIS — H04123 Dry eye syndrome of bilateral lacrimal glands: Secondary | ICD-10-CM | POA: Diagnosis not present

## 2023-07-07 DIAGNOSIS — H02831 Dermatochalasis of right upper eyelid: Secondary | ICD-10-CM | POA: Diagnosis not present

## 2023-07-07 DIAGNOSIS — H353132 Nonexudative age-related macular degeneration, bilateral, intermediate dry stage: Secondary | ICD-10-CM | POA: Diagnosis not present

## 2023-07-07 DIAGNOSIS — H26491 Other secondary cataract, right eye: Secondary | ICD-10-CM | POA: Diagnosis not present

## 2023-07-07 DIAGNOSIS — H1045 Other chronic allergic conjunctivitis: Secondary | ICD-10-CM | POA: Diagnosis not present

## 2023-07-07 DIAGNOSIS — H02834 Dermatochalasis of left upper eyelid: Secondary | ICD-10-CM | POA: Diagnosis not present

## 2023-07-07 DIAGNOSIS — Z961 Presence of intraocular lens: Secondary | ICD-10-CM | POA: Diagnosis not present

## 2023-07-07 DIAGNOSIS — H16213 Exposure keratoconjunctivitis, bilateral: Secondary | ICD-10-CM | POA: Diagnosis not present

## 2023-07-07 DIAGNOSIS — H5310 Unspecified subjective visual disturbances: Secondary | ICD-10-CM | POA: Diagnosis not present

## 2023-07-07 DIAGNOSIS — H43393 Other vitreous opacities, bilateral: Secondary | ICD-10-CM | POA: Diagnosis not present

## 2023-07-07 DIAGNOSIS — E119 Type 2 diabetes mellitus without complications: Secondary | ICD-10-CM | POA: Diagnosis not present

## 2023-07-07 LAB — HM DIABETES EYE EXAM

## 2023-07-09 ENCOUNTER — Telehealth: Payer: Self-pay | Admitting: *Deleted

## 2023-07-09 DIAGNOSIS — H539 Unspecified visual disturbance: Secondary | ICD-10-CM | POA: Diagnosis not present

## 2023-07-09 DIAGNOSIS — G43109 Migraine with aura, not intractable, without status migrainosus: Secondary | ICD-10-CM | POA: Diagnosis not present

## 2023-07-09 NOTE — Telephone Encounter (Addendum)
Her physician gave her a Toradol injection and oral Nurtec today. If this does not help, they will start her on rizatriptan. Asking if it will be OK with her being on pembrolizumab? Forwarded inquiry to pharmacist, who said there are no drug-drug interactions.

## 2023-07-15 ENCOUNTER — Encounter: Payer: Self-pay | Admitting: Oncology

## 2023-07-19 ENCOUNTER — Other Ambulatory Visit: Payer: Self-pay | Admitting: Oncology

## 2023-07-20 ENCOUNTER — Emergency Department (HOSPITAL_COMMUNITY): Payer: PPO

## 2023-07-20 ENCOUNTER — Other Ambulatory Visit: Payer: Self-pay

## 2023-07-20 ENCOUNTER — Encounter (HOSPITAL_COMMUNITY): Payer: Self-pay

## 2023-07-20 ENCOUNTER — Emergency Department (HOSPITAL_COMMUNITY)
Admission: EM | Admit: 2023-07-20 | Discharge: 2023-07-20 | Disposition: A | Discharge status: Home / Self Care | Payer: PPO | Attending: Emergency Medicine | Admitting: Emergency Medicine

## 2023-07-20 DIAGNOSIS — R9082 White matter disease, unspecified: Secondary | ICD-10-CM | POA: Diagnosis not present

## 2023-07-20 DIAGNOSIS — I6782 Cerebral ischemia: Secondary | ICD-10-CM | POA: Diagnosis not present

## 2023-07-20 DIAGNOSIS — Z7982 Long term (current) use of aspirin: Secondary | ICD-10-CM | POA: Diagnosis not present

## 2023-07-20 DIAGNOSIS — H538 Other visual disturbances: Secondary | ICD-10-CM | POA: Diagnosis not present

## 2023-07-20 DIAGNOSIS — H539 Unspecified visual disturbance: Secondary | ICD-10-CM | POA: Diagnosis not present

## 2023-07-20 DIAGNOSIS — R519 Headache, unspecified: Secondary | ICD-10-CM | POA: Diagnosis not present

## 2023-07-20 DIAGNOSIS — G43909 Migraine, unspecified, not intractable, without status migrainosus: Secondary | ICD-10-CM | POA: Diagnosis not present

## 2023-07-20 DIAGNOSIS — I6523 Occlusion and stenosis of bilateral carotid arteries: Secondary | ICD-10-CM | POA: Diagnosis not present

## 2023-07-20 DIAGNOSIS — G9389 Other specified disorders of brain: Secondary | ICD-10-CM | POA: Diagnosis not present

## 2023-07-20 LAB — CBC
HCT: 40.8 % (ref 36.0–46.0)
Hemoglobin: 12.5 g/dL (ref 12.0–15.0)
MCH: 27.7 pg (ref 26.0–34.0)
MCHC: 30.6 g/dL (ref 30.0–36.0)
MCV: 90.3 fL (ref 80.0–100.0)
Platelets: 294 10*3/uL (ref 150–400)
RBC: 4.52 MIL/uL (ref 3.87–5.11)
RDW: 15 % (ref 11.5–15.5)
WBC: 5.6 10*3/uL (ref 4.0–10.5)
nRBC: 0 % (ref 0.0–0.2)

## 2023-07-20 LAB — BASIC METABOLIC PANEL
Anion gap: 10 (ref 5–15)
BUN: 11 mg/dL (ref 8–23)
CO2: 24 mmol/L (ref 22–32)
Calcium: 8 mg/dL — ABNORMAL LOW (ref 8.9–10.3)
Chloride: 104 mmol/L (ref 98–111)
Creatinine, Ser: 0.76 mg/dL (ref 0.44–1.00)
GFR, Estimated: >60 mL/min (ref >60)
Glucose, Bld: 89 mg/dL (ref 70–99)
Potassium: 3.9 mmol/L (ref 3.5–5.1)
Sodium: 138 mmol/L (ref 135–145)

## 2023-07-20 NOTE — ED Notes (Signed)
Patient transported to MRI 

## 2023-07-20 NOTE — ED Triage Notes (Signed)
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 Pt states she sees colors and movement, like looking through a clidescope out of right [id number].

## 2023-07-20 NOTE — Discharge Instructions (Addendum)
Return for any problem.  ?

## 2023-07-20 NOTE — ED Provider Notes (Signed)
[place] EMERGENCY DEPARTMENT AT [facility] Provider Note   CSN: [id number] Arrival date & time: 07/20/23  1200     History  No chief complaint on file.   Destiny [name] is a 77 y.o. female.  77 year old female with prior medical history as detailed below presents for evaluation.  Patient complains of 3 weeks of persistent visual change in her right eye.  She reports kaleidoscope like distortions of the right visual fields.  She reports blurriness to the vision of the right eye.  With covering of the right eye her vision is normal through the left.  She denies associated pain.  She denies other symptoms including weakness, fever, nausea, vomiting, etc.  She reports that she has already been evaluated by 2 optometrist/ophthalmologist in the last 2 weeks for these complaints.  She was advised that her eyes appear normal.  She has a pending neurology evaluation in November in the outpatient setting.  Her PCP recommended that she come to the ED for neuroimaging.    The history is provided by the patient and medical records.       Home Medications Prior to Admission medications   Medication Sig Start Date End Date Taking? Authorizing Provider  acetaminophen (TYLENOL) 500 MG tablet Take 1,000 mg by mouth every 6 (six) hours as needed for moderate pain.    [provider]  aspirin EC 81 MG tablet Take 81 mg by mouth in the morning.    [provider]  Cholecalciferol (VITAMIN D3) 50 MCG (2000 UT) TABS Take 2,000 Units by mouth in the morning.    [provider]  denosumab (PROLIA) 60 MG/ML SOSY injection Inject 60 mg into the skin every 6 (six) months.    [provider]  ezetimibe (ZETIA) 10 MG tablet Take 10 mg by mouth in the morning. 02/07/21   [provider]  levothyroxine (SYNTHROID) 100 MCG tablet Take 100 mcg by mouth daily before breakfast. 07/15/20   [provider]  metFORMIN (GLUCOPHAGE-XR) 500 MG 24 hr  tablet Take 500 mg by mouth every evening. 06/24/17   [provider]  Multiple Vitamins-Minerals (OCUVITE EXTRA) TABS Take 1 tablet by mouth in the morning and at bedtime.    [provider]  [facility] ULTRA test strip  11/16/21   [provider]  pravastatin (PRAVACHOL) 40 MG tablet Take 40 mg by mouth every evening. 01/27/21   [provider]  Semaglutide,0.25 or 0.5MG /DOS, (OZEMPIC, 0.25 OR 0.5 MG/DOSE,) 2 MG/3ML SOPN Inject 0.5 mg into the skin once a week.    [provider]      Allergies    Aspirin, Atorvastatin, Chlorhexidine gluconate [chlorhexidine], Fentanyl, Lisinopril, Penicillins, and Hydrocodone    Review of Systems   Review of Systems  All other systems reviewed and are negative.   Physical Exam Updated Vital Signs BP 127/70   Pulse 72   Temp 98 F (36.7 C) (Oral)   Resp 18   Ht 5\' 3"  (1.6 m)   Wt 87.5 kg   SpO2 97%   BMI 34.17 kg/m  Physical Exam Vitals and nursing note reviewed.  Constitutional:      General: She is not in acute distress.    Appearance: Normal appearance. She is well-developed.  HENT:     Head: Normocephalic and atraumatic.  Eyes:     Conjunctiva/sclera: Conjunctivae normal.     Pupils: Pupils are equal, round, and reactive to light.  Cardiovascular:     Rate  and Rhythm: Normal rate and regular rhythm.     Heart sounds: Normal heart sounds.  Pulmonary:     Effort: Pulmonary effort is normal. No respiratory distress.     Breath sounds: Normal breath sounds.  Abdominal:     General: There is no distension.     Palpations: Abdomen is soft.     Tenderness: There is no abdominal tenderness.  Musculoskeletal:        General: No deformity. Normal range of motion.     Cervical back: Normal range of motion and neck supple.  Skin:    General: Skin is warm and dry.  Neurological:     General: No focal deficit present.     Mental Status: She is alert and oriented to person, place, and time.      ED Results / Procedures / Treatments   Labs (all labs ordered are listed, but only abnormal results are displayed) Labs Reviewed  BASIC METABOLIC PANEL - Abnormal; Notable for the following components:      Result Value   Calcium 8.0 (*)    All other components within normal limits  CBC    EKG None  Radiology CT Head Wo Contrast  Result Date: 07/20/2023 CLINICAL DATA:  Ocular migraine. EXAM: CT HEAD WITHOUT CONTRAST TECHNIQUE: Contiguous axial images were obtained from the base of the skull through the vertex without intravenous contrast. RADIATION DOSE REDUCTION: This exam was performed according to the departmental dose-optimization program which includes automated exposure control, adjustment of the mA and/or kV according to patient size and/or use of iterative reconstruction technique. COMPARISON:  None Available. FINDINGS: Brain: There is mild cerebral atrophy with widening of the extra-axial spaces and ventricular dilatation. There are areas of decreased attenuation within the white matter tracts of the supratentorial brain, consistent with microvascular disease changes. Vascular: There is moderate to marked severity bilateral cavernous carotid artery calcification. Skull: Normal. Negative for fracture or focal lesion. Sinuses/Orbits: No acute finding. Other: None. IMPRESSION: 1. Generalized cerebral atrophy with chronic white matter small vessel ischemic changes. 2. No acute intracranial abnormality. Electronically Signed   By: Aram Candela M.D.   On: 07/20/2023 18:00    Procedures Procedures    Medications Ordered in ED Medications - No data to display  ED Course/ Medical Decision Making/ A&P                                 Medical Decision Making Amount and/or Complexity of Data Reviewed Radiology: ordered.    Medical Screen Complete  This patient presented to the ED with complaint of visual change.  This complaint involves an extensive number of  treatment options. The initial differential diagnosis includes, but is not limited to, ocular migraine, intracranial mass, CVA, etc  This presentation is: Acute, Self-Limited, Previously Undiagnosed, Uncertain Prognosis, Complicated, and Systemic Symptoms  Patient reports approximately 3 weeks of persistent visual change of the vision from the right eye.  She reports to outpatient optometry/ophthalmology evaluations with no clear etiology of her symptoms identified.  Patient has been offered a possible explanation of ocular migraine.  She denies associated headache.  She denies focal weakness.  She was referred to the ED for neuroimaging by her PCP.  She has an appointment with neurology pending in November.  CT head and MRI brain are without significant acute abnormality.  Patient is reassured by findings.  Patient offered treatment of possible ocular migraine here in  the ED.  She declined same.  She denies pain.  She prefers to be discharged at this time.  Strict return precautions given and understood.  Importance of close follow-up is stressed.  Additional history obtained:  External records from outside sources obtained and reviewed including prior ED visits and prior Inpatient records.    Lab Tests:  I ordered and personally interpreted labs.  The pertinent results include:  CBC BMP   Imaging Studies ordered:  I ordered imaging studies including CT head, MRI brain I independently visualized and interpreted obtained imaging which showed NAD I agree with the radiologist interpretation.   Cardiac Monitoring:  The patient was maintained on a cardiac monitor.  I personally viewed and interpreted the cardiac monitor which showed an underlying rhythm of: NSR  Problem List / ED Course:  Visual change   Reevaluation:  After the interventions noted above, I reevaluated the patient and found that they have: stayed the same  Disposition:  After consideration of the  diagnostic results and the patients response to treatment, I feel that the patent would benefit from outpatient follow-up.          Final Clinical Impression(s) / ED Diagnoses Final diagnoses:  Visual changes    Rx / DC Orders ED Discharge Orders     None         Wynetta Fines, MD 07/20/23 2120

## 2023-07-20 NOTE — ED Provider Triage Note (Signed)
Emergency Medicine Provider Triage Evaluation Note  Destiny [name] , a 77 y.o. female  was evaluated in triage.  Pt complains of ocular migraine x2 weeks and slight headache. Patient was started on a triptan by her PCP, which did not help. Reports that her right eye vision appears like a "kaleidoscope," and sees shapes, movement, and colors. She is scheduled to see neurology in November but was advised to come here for further evaluation.  Review of Systems  Positive: Right eye visual changes, HA Negative: Weakness, difficulty ambulating, dizziness  Physical Exam  BP (!) 142/76 (BP Location: Left Arm)   Pulse 85   Temp 98 F (36.7 C)   Resp 17   Ht 5\' 3"  (1.6 m)   Wt 87.5 kg   SpO2 95%   BMI 34.17 kg/m  Gen:   Awake, no distress   Resp:  Normal effort  MSK:   Moves extremities without difficulty  Other:    Medical Decision Making  Medically screening exam initiated at 1:17 PM.  Appropriate orders placed.  Destiny [name] was informed that the remainder of the evaluation will be completed by another provider, this initial triage assessment does not replace that evaluation, and the importance of remaining in the ED until their evaluation is complete.   Maxwell Marion, PA-C 07/20/23 1320

## 2023-07-23 ENCOUNTER — Inpatient Hospital Stay: Payer: PPO | Admitting: Oncology

## 2023-07-23 ENCOUNTER — Inpatient Hospital Stay: Payer: PPO

## 2023-07-23 ENCOUNTER — Encounter: Payer: Self-pay | Admitting: *Deleted

## 2023-07-23 ENCOUNTER — Inpatient Hospital Stay: Payer: PPO | Attending: Oncology

## 2023-07-23 VITALS — BP 131/71 | HR 62

## 2023-07-23 DIAGNOSIS — E559 Vitamin D deficiency, unspecified: Secondary | ICD-10-CM | POA: Diagnosis not present

## 2023-07-23 DIAGNOSIS — C439 Malignant melanoma of skin, unspecified: Secondary | ICD-10-CM

## 2023-07-23 DIAGNOSIS — R9082 White matter disease, unspecified: Secondary | ICD-10-CM | POA: Insufficient documentation

## 2023-07-23 DIAGNOSIS — E785 Hyperlipidemia, unspecified: Secondary | ICD-10-CM | POA: Diagnosis not present

## 2023-07-23 DIAGNOSIS — I7090 Unspecified atherosclerosis: Secondary | ICD-10-CM | POA: Diagnosis not present

## 2023-07-23 DIAGNOSIS — L97429 Non-pressure chronic ulcer of left heel and midfoot with unspecified severity: Secondary | ICD-10-CM | POA: Diagnosis not present

## 2023-07-23 DIAGNOSIS — E119 Type 2 diabetes mellitus without complications: Secondary | ICD-10-CM | POA: Insufficient documentation

## 2023-07-23 DIAGNOSIS — E039 Hypothyroidism, unspecified: Secondary | ICD-10-CM | POA: Diagnosis not present

## 2023-07-23 DIAGNOSIS — H539 Unspecified visual disturbance: Secondary | ICD-10-CM | POA: Diagnosis not present

## 2023-07-23 DIAGNOSIS — Z9884 Bariatric surgery status: Secondary | ICD-10-CM | POA: Diagnosis not present

## 2023-07-23 DIAGNOSIS — Z5112 Encounter for antineoplastic immunotherapy: Secondary | ICD-10-CM | POA: Diagnosis not present

## 2023-07-23 DIAGNOSIS — C4372 Malignant melanoma of left lower limb, including hip: Secondary | ICD-10-CM | POA: Insufficient documentation

## 2023-07-23 DIAGNOSIS — E1140 Type 2 diabetes mellitus with diabetic neuropathy, unspecified: Secondary | ICD-10-CM | POA: Diagnosis not present

## 2023-07-23 DIAGNOSIS — M810 Age-related osteoporosis without current pathological fracture: Secondary | ICD-10-CM | POA: Diagnosis not present

## 2023-07-23 LAB — CBC WITH DIFFERENTIAL (CANCER CENTER ONLY)
Abs Immature Granulocytes: 0.01 10*3/uL (ref 0.00–0.07)
Basophils Absolute: 0 10*3/uL (ref 0.0–0.1)
Basophils Relative: 0 %
Eosinophils Absolute: 0.1 10*3/uL (ref 0.0–0.5)
Eosinophils Relative: 2 %
HCT: 39.2 % (ref 36.0–46.0)
Hemoglobin: 12.4 g/dL (ref 12.0–15.0)
Immature Granulocytes: 0 %
Lymphocytes Relative: 18 %
Lymphs Abs: 1 10*3/uL (ref 0.7–4.0)
MCH: 28.4 pg (ref 26.0–34.0)
MCHC: 31.6 g/dL (ref 30.0–36.0)
MCV: 89.9 fL (ref 80.0–100.0)
Monocytes Absolute: 0.3 10*3/uL (ref 0.1–1.0)
Monocytes Relative: 6 %
Neutro Abs: 3.9 10*3/uL (ref 1.7–7.7)
Neutrophils Relative %: 74 %
Platelet Count: 257 10*3/uL (ref 150–400)
RBC: 4.36 MIL/uL (ref 3.87–5.11)
RDW: 15.1 % (ref 11.5–15.5)
WBC Count: 5.3 10*3/uL (ref 4.0–10.5)
nRBC: 0 % (ref 0.0–0.2)

## 2023-07-23 LAB — CMP (CANCER CENTER ONLY)
ALT: 7 U/L (ref 0–44)
AST: 11 U/L — ABNORMAL LOW (ref 15–41)
Albumin: 4.2 g/dL (ref 3.5–5.0)
Alkaline Phosphatase: 58 U/L (ref 38–126)
Anion gap: 8 (ref 5–15)
BUN: 13 mg/dL (ref 8–23)
CO2: 25 mmol/L (ref 22–32)
Calcium: 8.3 mg/dL — ABNORMAL LOW (ref 8.9–10.3)
Chloride: 109 mmol/L (ref 98–111)
Creatinine: 0.69 mg/dL (ref 0.44–1.00)
GFR, Estimated: >60 mL/min (ref >60)
Glucose, Bld: 132 mg/dL — ABNORMAL HIGH (ref 70–99)
Potassium: 3.7 mmol/L (ref 3.5–5.1)
Sodium: 142 mmol/L (ref 135–145)
Total Bilirubin: 0.5 mg/dL (ref 0.3–1.2)
Total Protein: 6.6 g/dL (ref 6.5–8.1)

## 2023-07-23 LAB — TSH: TSH: 0.535 u[IU]/mL (ref 0.350–4.500)

## 2023-07-23 MED ORDER — SODIUM CHLORIDE 0.9 % IV SOLN
200.0000 mg | Freq: Once | INTRAVENOUS | Status: AC
  Administered 2023-07-23: 200 mg via INTRAVENOUS
  Filled 2023-07-23: qty 8

## 2023-07-23 MED ORDER — SODIUM CHLORIDE 0.9 % IV SOLN: Freq: Once | INTRAVENOUS | Status: AC

## 2023-07-23 NOTE — Progress Notes (Signed)
[facility] OFFICE PROGRESS NOTE   Diagnosis: Melanoma  INTERVAL HISTORY:   [name] returns as scheduled.  She completed another treatment pembrolizumab on 07/13/2023.  No rash.  No change in baseline intermittent diarrhea.  She has not noted a change in the left groin mass or left heel ulcer.  She continues to have a visual disturbance in the right eye.  She describes appreciating "motion" in the right eye.  She has decreased visual acuity in the right eye.  No symptoms in the left eye.  The visual disturbance is constant and has not changed.  She reports seeing an ophthalmologist and no specific diagnosis was made.  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  No eye edema or pain.  She reports being evaluated by 2 ophthalmologists.  Objective:  Vital signs in last 24 hours:  Blood pressure 106/68, pulse 72, temperature 98.1 F (36.7 C), temperature source Temporal, resp. rate 18, height 5\' 3"  (1.6 m), weight 187 lb 14.4 oz (85.2 kg), SpO2 100%.     Lymphatics: Firm 6 cm left inguinal mass Resp: Lungs with coarse rhonchi at the left posterior base, no respiratory distress Cardio: Regular rate and rhythm GI: No hepatosplenomegaly Vascular: Left heel ulcer with callus formation    Lab Results:  Lab Results  Component Value Date   WBC 5.3 07/23/2023   HGB 12.4 07/23/2023   HCT 39.2 07/23/2023   MCV 89.9 07/23/2023   PLT 257 07/23/2023   NEUTROABS 3.9 07/23/2023    CMP  Lab Results  Component Value Date   NA 142 07/23/2023   K 3.7 07/23/2023   CL 109 07/23/2023   CO2 25 07/23/2023   GLUCOSE 132 (H) 07/23/2023   BUN 13 07/23/2023   CREATININE 0.69 07/23/2023   CALCIUM 8.3 (L) 07/23/2023   PROT 6.6 07/23/2023   ALBUMIN 4.2 07/23/2023   AST 11 (L) 07/23/2023   ALT 7 07/23/2023   ALKPHOS 58 07/23/2023   BILITOT 0.5 07/23/2023   GFRNONAA >60 07/23/2023   GFRAA >60 09/24/2017    Lab Results  Component Value Date    CEA 2.19 03/02/2023   Imaging:  MR BRAIN WO CONTRAST  Result Date: 07/20/2023 CLINICAL DATA:  Initial evaluation for acute headache. EXAM: MRI HEAD WITHOUT CONTRAST TECHNIQUE: Multiplanar, multiecho pulse sequences of the brain and surrounding structures were obtained without intravenous contrast. COMPARISON:  CT from earlier the same day. FINDINGS: Brain: Cerebral volume within normal limits. Patchy T2/FLAIR hyperintensity seen involving the periventricular, deep, and subcortical white matter of both cerebral hemispheres, mild for age. No evidence for acute or subacute ischemia. Gray-white matter differentiation maintained. No acute or chronic intracranial blood products. No mass lesion, midline shift or mass effect. No hydrocephalus or extra-axial fluid collection. Pituitary gland suprasellar region within normal limits. Vascular: Major intracranial vascular flow voids are maintained. Skull and upper cervical spine: Craniocervical junction within normal limits. Bone marrow signal intensity normal. No scalp soft tissue abnormality. Sinuses/Orbits: Prior bilateral ocular lens replacement. Paranasal sinuses are largely clear. No mastoid effusion. Other: None. IMPRESSION: 1. No acute intracranial abnormality. 2. 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 Sequelae of migrainous disorder would be the primary differential consideration given the history of headaches. Electronically Signed   By: Rise Mu M.D.   On: 07/20/2023 19:41   CT Head Wo Contrast  Result Date: 07/20/2023 CLINICAL DATA:  Ocular migraine. EXAM: CT HEAD WITHOUT CONTRAST  TECHNIQUE: Contiguous axial images were obtained from the base of the skull through the vertex without intravenous contrast. RADIATION DOSE REDUCTION: This exam was performed according to the departmental dose-optimization program which includes automated exposure control, adjustment of the  mA and/or kV according to patient size and/or use of iterative reconstruction technique. COMPARISON:  None Available. FINDINGS: Brain: There is mild cerebral atrophy with widening of the extra-axial spaces and ventricular dilatation. There are areas of decreased attenuation within the white matter tracts of the supratentorial brain, consistent with microvascular disease changes. Vascular: There is moderate to marked severity bilateral cavernous carotid artery calcification. Skull: Normal. Negative for fracture or focal lesion. Sinuses/Orbits: No acute finding. Other: None. IMPRESSION: 1. Generalized cerebral atrophy with chronic white matter small vessel ischemic changes. 2. No acute intracranial abnormality. Electronically Signed   By: Aram Candela M.D.   On: 07/20/2023 18:00    Medications: I have reviewed the patient's current medications.   Assessment/Plan: Rectal cancer Colonoscopy 03/06/2022-nonobstructing mass at 12 cm from the anal verge-biopsy invasive moderately differentiated adenocarcinoma arising within a tubular adenoma with high-grade dysplasia CTs 03/13/2022-no rectal mass seen, no evidence of metastatic disease, small 2-4 mm pulmonary nodules-at least 2 are calcified, likely benign granulomas, nonobstructing stone in the interpolar right renal collecting system MRI pelvis 03/30/2022-tumor at 11-13 cm from the anal verge, T3b, early EMVI?,  No adenopathy Neoadjuvant radiation/capecitabine 04/21/2022-05/28/2022 Xeloda discontinued 05/15/2022 due to progressive breakdown/ulceration at the left heel. Low anterior resection 08/13/2022-negative for residual cancer,ypT0ypN0, 0/14 nodes margins negative, no distinct mass, 2.1 x 1.5 cm minimally ulcerated firm area of mucosa   2.   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 (reexcision 04/24/2021 and 05/22/2021, final margins clear,pT3b,pN0, ulceration present, satellitosis absent, lymphovascular invasion absent,  neurotropism present, tumor infiltrating lymphocytes-nonbrisk, tumor regression absent Nonhealing left heel ulceration beginning August 2023, followed at the wound clinic Punch biopsy of left heel wound 05/11/2023-malignant melanoma in the dermis extending to the deep margins with no clear connection to the epidermis, consistent with local recurrence of melanoma FNA biopsy of left inguinal lymph node 05/21/2023-atypical cells present, SOX-10 negative Cycle 1 Pembrolizumab 06/12/2023 Cycle 2 Pembrolizumab 07/03/2023 Cycle 3 pembrolizumab 07/23/2023   3.  Diabetes   4.  Laparoscopic gastric band surgery   5.  Left heel ulceration-progressive 05/15/2022.  Xeloda discontinued.  Followed at the wound clinic.    Disposition: Destiny [name] has completed 2 cycles of pembrolizumab.  She has tolerated the treatment well.  I doubt the right eye symptoms are related to pembrolizumab.  We will follow-up on the ophthalmology evaluation.  The left groin mass appears unchanged.  The left heel ulcer may be healing.  She will return for an office visit in 3 weeks.  I will contact Dr. Lenis Noon regarding the plan for a restaging evaluation.  Thornton Papas, MD  07/23/2023  2:34 PM

## 2023-07-23 NOTE — Progress Notes (Signed)
Patient seen by Dr. Thornton Papas today  Vitals are within treatment parameters:Yes   Labs are within treatment parameters: Yes   Treatment plan has been signed: Yes   Per physician team, Patient is ready for treatment and there are NO modifications to the treatment plan.

## 2023-07-23 NOTE — Patient Instructions (Signed)
[facility]   Discharge Instructions: Thank you for choosing [facility] to provide your oncology and hematology care.   If you have a lab appointment with the Cancer Center, please go directly to the Cancer Center and check in at the registration area.   Wear comfortable clothing and clothing appropriate for easy access to any Portacath or PICC line.   We strive to give you quality time with your provider. You may need to reschedule your appointment if you arrive late (15 or more minutes).  Arriving late affects you and other patients whose appointments are after yours.  Also, if you miss three or more appointments without notifying the office, you may be dismissed from the clinic at the provider's discretion.      For prescription refill requests, have your pharmacy contact our office and allow 72 hours for refills to be completed.    Today you received the following chemotherapy and/or immunotherapy agents Pembrolizumab (KEYTRUDA).      To help prevent nausea and vomiting after your treatment, we encourage you to take your nausea medication as directed.  BELOW ARE SYMPTOMS THAT SHOULD BE REPORTED IMMEDIATELY: *FEVER GREATER THAN 100.4 F (38 C) OR HIGHER *CHILLS OR SWEATING *NAUSEA AND VOMITING THAT IS NOT CONTROLLED WITH YOUR NAUSEA MEDICATION *UNUSUAL SHORTNESS OF BREATH *UNUSUAL BRUISING OR BLEEDING *URINARY PROBLEMS (pain or burning when urinating, or frequent urination) *BOWEL PROBLEMS (unusual diarrhea, constipation, pain near the anus) TENDERNESS IN MOUTH AND THROAT WITH OR WITHOUT PRESENCE OF ULCERS (sore throat, sores in mouth, or a toothache) UNUSUAL RASH, SWELLING OR PAIN  UNUSUAL VAGINAL DISCHARGE OR ITCHING   Items with * indicate a potential emergency and should be followed up as soon as possible or go to the Emergency Department if any problems should occur.  Please show the CHEMOTHERAPY ALERT CARD or IMMUNOTHERAPY  ALERT CARD at check-in to the Emergency Department and triage nurse.  Should you have questions after your visit or need to cancel or reschedule your appointment, please contact [facility]  Dept: [phone]  and follow the prompts.  Office hours are 8:00 a.m. to 4:30 p.m. Monday - Friday. Please note that voicemails left after 4:00 p.m. may not be returned until the following business day.  We are closed weekends and major holidays. You have access to a nurse at all times for urgent questions. Please call the main number to the clinic Dept: [phone] and follow the prompts.   For any non-urgent questions, you may also contact your provider using MyChart. We now offer e-Visits for anyone 6 and older to request care online for non-urgent symptoms. For details visit [email or web address].   Also download the MyChart app! Go to the app store, search "MyChart", open the app, select [facility], and log in with your MyChart username and password.  Pembrolizumab Injection What is this medication? PEMBROLIZUMAB (PEM broe LIZ ue mab) treats some types of cancer. It works by helping your immune system slow or stop the spread of cancer cells. It is a monoclonal antibody. This medicine may be used for other purposes; ask your health care provider or pharmacist if you have questions. COMMON BRAND NAME(S): Keytruda What should I tell my care team before I take this medication? They need to know if you have any of these conditions: Allogeneic stem cell transplant (uses someone else's stem cells) Autoimmune diseases, such as Crohn disease, ulcerative colitis, lupus History of chest radiation Nervous  system problems, such as Guillain-Barre syndrome, myasthenia gravis Organ transplant An unusual or allergic reaction to pembrolizumab, other medications, foods, dyes, or preservatives Pregnant or trying to get pregnant Breast-feeding How should I use this  medication? This medication is injected into a vein. It is given by your care team in a hospital or clinic setting. A special MedGuide will be given to you before each treatment. Be sure to read this information carefully each time. Talk to your care team about the use of this medication in children. While it may be prescribed for children as young as 6 months for selected conditions, precautions do apply. Overdosage: If you think you have taken too much of this medicine contact a poison control center or emergency room at once. NOTE: This medicine is only for you. Do not share this medicine with others. What if I miss a dose? Keep appointments for follow-up doses. It is important not to miss your dose. Call your care team if you are unable to keep an appointment. What may interact with this medication? Interactions have not been studied. This list may not describe all possible interactions. Give your health care provider a list of all the medicines, herbs, non-prescription drugs, or dietary supplements you use. Also tell them if you smoke, drink alcohol, or use illegal drugs. Some items may interact with your medicine. What should I watch for while using this medication? Your condition will be monitored carefully while you are receiving this medication. You may need blood work while taking this medication. This medication may cause serious skin reactions. They can happen weeks to months after starting the medication. Contact your care team right away if you notice fevers or flu-like symptoms with a rash. The rash may be red or purple and then turn into blisters or peeling of the skin. You may also notice a red rash with swelling of the face, lips, or lymph nodes in your neck or under your arms. Tell your care team right away if you have any change in your eyesight. Talk to your care team if you may be pregnant. Serious birth defects can occur if you take this medication during pregnancy and for 4  months after the last dose. You will need a negative pregnancy test before starting this medication. Contraception is recommended while taking this medication and for 4 months after the last dose. Your care team can help you find the option that works for you. Do not breastfeed while taking this medication and for 4 months after the last dose. What side effects may I notice from receiving this medication? Side effects that you should report to your care team as soon as possible: Allergic reactions--skin rash, itching, hives, swelling of the face, lips, tongue, or throat Dry cough, shortness of breath or trouble breathing Eye pain, redness, irritation, or discharge with blurry or decreased vision Heart muscle inflammation--unusual weakness or fatigue, shortness of breath, chest pain, fast or irregular heartbeat, dizziness, swelling of the ankles, feet, or hands Hormone gland problems--headache, sensitivity to light, unusual weakness or fatigue, dizziness, fast or irregular heartbeat, increased sensitivity to cold or heat, excessive sweating, constipation, hair loss, increased thirst or amount of urine, tremors or shaking, irritability Infusion reactions--chest pain, shortness of breath or trouble breathing, feeling faint or lightheaded Kidney injury (glomerulonephritis)--decrease in the amount of urine, red or dark brown urine, foamy or bubbly urine, swelling of the ankles, hands, or feet Liver injury--right upper belly pain, loss of appetite, nausea, light-colored stool, dark yellow or  brown urine, yellowing skin or eyes, unusual weakness or fatigue Pain, tingling, or numbness in the hands or feet, muscle weakness, change in vision, confusion or trouble speaking, loss of balance or coordination, trouble walking, seizures Rash, fever, and swollen lymph nodes Redness, blistering, peeling, or loosening of the skin, including inside the mouth Sudden or severe stomach pain, bloody diarrhea, fever, nausea,  vomiting Side effects that usually do not require medical attention (report to your care team if they continue or are bothersome): Bone, joint, or muscle pain Diarrhea Fatigue Loss of appetite Nausea Skin rash This list may not describe all possible side effects. Call your doctor for medical advice about side effects. You may report side effects to FDA at 1-800-FDA-1088. Where should I keep my medication? This medication is given in a hospital or clinic. It will not be stored at home. NOTE: This sheet is a summary. It may not cover all possible information. If you have questions about this medicine, talk to your doctor, pharmacist, or health care provider.  2024 Elsevier/Gold Standard (2022-02-11 00:00:00)

## 2023-07-24 LAB — T4: T4, Total: 9.9 ug/dL (ref 4.5–12.0)

## 2023-07-28 ENCOUNTER — Encounter: Payer: Self-pay | Admitting: Oncology

## 2023-07-28 NOTE — Telephone Encounter (Signed)
TC

## 2023-08-09 ENCOUNTER — Other Ambulatory Visit: Payer: Self-pay | Admitting: Oncology

## 2023-08-13 ENCOUNTER — Inpatient Hospital Stay: Payer: PPO | Admitting: Nurse Practitioner

## 2023-08-13 ENCOUNTER — Encounter: Payer: Self-pay | Admitting: Nurse Practitioner

## 2023-08-13 ENCOUNTER — Inpatient Hospital Stay: Payer: PPO

## 2023-08-13 VITALS — BP 121/67 | HR 60 | Temp 98.1°F | Resp 18 | Ht 63.0 in | Wt 185.5 lb

## 2023-08-13 DIAGNOSIS — Z5112 Encounter for antineoplastic immunotherapy: Secondary | ICD-10-CM | POA: Diagnosis not present

## 2023-08-13 DIAGNOSIS — C439 Malignant melanoma of skin, unspecified: Secondary | ICD-10-CM | POA: Diagnosis not present

## 2023-08-13 LAB — CMP (CANCER CENTER ONLY)
ALT: 6 U/L (ref 0–44)
AST: 10 U/L — ABNORMAL LOW (ref 15–41)
Albumin: 4.5 g/dL (ref 3.5–5.0)
Alkaline Phosphatase: 55 U/L (ref 38–126)
Anion gap: 8 (ref 5–15)
BUN: 12 mg/dL (ref 8–23)
CO2: 27 mmol/L (ref 22–32)
Calcium: 8.9 mg/dL (ref 8.9–10.3)
Chloride: 105 mmol/L (ref 98–111)
Creatinine: 0.8 mg/dL (ref 0.44–1.00)
GFR, Estimated: >60 mL/min (ref >60)
Glucose, Bld: 93 mg/dL (ref 70–99)
Potassium: 4 mmol/L (ref 3.5–5.1)
Sodium: 140 mmol/L (ref 135–145)
Total Bilirubin: 0.6 mg/dL (ref 0.3–1.2)
Total Protein: 6.7 g/dL (ref 6.5–8.1)

## 2023-08-13 LAB — CBC WITH DIFFERENTIAL (CANCER CENTER ONLY)
Abs Immature Granulocytes: 0.02 10*3/uL (ref 0.00–0.07)
Basophils Absolute: 0 10*3/uL (ref 0.0–0.1)
Basophils Relative: 0 %
Eosinophils Absolute: 0.1 10*3/uL (ref 0.0–0.5)
Eosinophils Relative: 2 %
HCT: 39.8 % (ref 36.0–46.0)
Hemoglobin: 12.8 g/dL (ref 12.0–15.0)
Immature Granulocytes: 0 %
Lymphocytes Relative: 14 %
Lymphs Abs: 0.8 10*3/uL (ref 0.7–4.0)
MCH: 28.8 pg (ref 26.0–34.0)
MCHC: 32.2 g/dL (ref 30.0–36.0)
MCV: 89.4 fL (ref 80.0–100.0)
Monocytes Absolute: 0.4 10*3/uL (ref 0.1–1.0)
Monocytes Relative: 7 %
Neutro Abs: 4.6 10*3/uL (ref 1.7–7.7)
Neutrophils Relative %: 77 %
Platelet Count: 265 10*3/uL (ref 150–400)
RBC: 4.45 MIL/uL (ref 3.87–5.11)
RDW: 14.7 % (ref 11.5–15.5)
WBC Count: 6 10*3/uL (ref 4.0–10.5)
nRBC: 0 % (ref 0.0–0.2)

## 2023-08-13 MED ORDER — SODIUM CHLORIDE 0.9 % IV SOLN
200.0000 mg | Freq: Once | INTRAVENOUS | Status: AC
  Administered 2023-08-13: 200 mg via INTRAVENOUS
  Filled 2023-08-13: qty 8

## 2023-08-13 MED ORDER — SODIUM CHLORIDE 0.9 % IV SOLN: Freq: Once | INTRAVENOUS | Status: AC

## 2023-08-13 NOTE — Progress Notes (Signed)
  [facility] OFFICE PROGRESS NOTE   Diagnosis: Melanoma  INTERVAL HISTORY:   [name] returns as scheduled.  She completed cycle 3 Pembrolizumab 07/23/2023.  No rash or diarrhea.  Occasional mild nausea.  No vomiting.  She feels the left groin mass is unchanged.  No change in right eye symptoms.   Objective:  Vital signs in last 24 hours:  Blood pressure 121/67, pulse 60, temperature 98.1 F (36.7 C), temperature source Temporal, resp. rate 18, height 5\' 3"  (1.6 m), weight 185 lb 8 oz (84.1 kg), SpO2 100%.    HEENT: No thrush or ulcers. Lymphatics: Firm approximate 7 cm left inguinal mass. Resp: Lungs clear bilaterally. Cardio: Regular rate and rhythm. GI: No hepatosplenomegaly. Vascular: No leg edema.   Lab Results:  Lab Results  Component Value Date   WBC 6.0 08/13/2023   HGB 12.8 08/13/2023   HCT 39.8 08/13/2023   MCV 89.4 08/13/2023   PLT 265 08/13/2023   NEUTROABS 4.6 08/13/2023    Imaging:  No results found.  Medications: I have reviewed the patient's current medications.  Assessment/Plan: Rectal cancer Colonoscopy 03/06/2022-nonobstructing mass at 12 cm from the anal verge-biopsy invasive moderately differentiated adenocarcinoma arising within a tubular adenoma with high-grade dysplasia CTs 03/13/2022-no rectal mass seen, no evidence of metastatic disease, small 2-4 mm pulmonary nodules-at least 2 are calcified, likely benign granulomas, nonobstructing stone in the interpolar right renal collecting system MRI pelvis 03/30/2022-tumor at 11-13 cm from the anal verge, T3b, early EMVI?,  No adenopathy Neoadjuvant radiation/capecitabine 04/21/2022-05/28/2022 Xeloda discontinued 05/15/2022 due to progressive breakdown/ulceration at the left heel. Low anterior resection 08/13/2022-negative for residual cancer,ypT0ypN0, 0/14 nodes margins negative, no distinct mass, 2.1 x 1.5 cm minimally ulcerated firm area of mucosa   2.   Left heel melanoma-wide  excision and sentinel lymph node biopsy 03/26/2021 (reexcision 04/24/2021 and 05/22/2021, final margins clear,pT3b,pN0, ulceration present, satellitosis absent, lymphovascular invasion absent, neurotropism present, tumor infiltrating lymphocytes-nonbrisk, tumor regression absent Nonhealing left heel ulceration beginning August 2023, followed at the wound clinic Punch biopsy of left heel wound 05/11/2023-malignant melanoma in the dermis extending to the deep margins with no clear connection to the epidermis, consistent with local recurrence of melanoma FNA biopsy of left inguinal lymph node 05/21/2023-atypical cells present, SOX-10 negative Cycle 1 Pembrolizumab 06/12/2023 Cycle 2 Pembrolizumab 07/03/2023 Cycle 3 pembrolizumab 07/23/2023 Cycle 4 Pembrolizumab 08/13/2023   3.  Diabetes   4.  Laparoscopic gastric band surgery   5.  Left heel ulceration-progressive 05/15/2022.  Xeloda discontinued.  Followed at the wound clinic.      Disposition: [name] appears stable.  She has completed 3 cycles of Pembrolizumab.  She is tolerating treatment well.  Plan to proceed with cycle 4 today as scheduled.  Restaging CTs prior to next office visit.  She has a persistent left groin mass.  Restage as above.  She will return for follow-up as scheduled in 2 weeks.  We are available to see her sooner as needed.    Lonna Cobb ANP/GNP-BC   08/13/2023  10:26 AM

## 2023-08-13 NOTE — Progress Notes (Signed)
Patient seen by Lonna Cobb NP today  Vitals are within treatment parameters:Yes   Labs are within treatment parameters: Yes   Treatment plan has been signed: Yes   Per physician team, Patient is ready for treatment and there are NO modifications to the treatment plan.

## 2023-08-13 NOTE — Patient Instructions (Signed)
[facility]   Discharge Instructions: Thank you for choosing [facility] to provide your oncology and hematology care.   If you have a lab appointment with the Cancer Center, please go directly to the Cancer Center and check in at the registration area.   Wear comfortable clothing and clothing appropriate for easy access to any Portacath or PICC line.   We strive to give you quality time with your provider. You may need to reschedule your appointment if you arrive late (15 or more minutes).  Arriving late affects you and other patients whose appointments are after yours.  Also, if you miss three or more appointments without notifying the office, you may be dismissed from the clinic at the provider's discretion.      For prescription refill requests, have your pharmacy contact our office and allow 72 hours for refills to be completed.    Today you received the following chemotherapy and/or immunotherapy agents Pembrolizumab (KEYTRUDA).      To help prevent nausea and vomiting after your treatment, we encourage you to take your nausea medication as directed.  BELOW ARE SYMPTOMS THAT SHOULD BE REPORTED IMMEDIATELY: *FEVER GREATER THAN 100.4 F (38 C) OR HIGHER *CHILLS OR SWEATING *NAUSEA AND VOMITING THAT IS NOT CONTROLLED WITH YOUR NAUSEA MEDICATION *UNUSUAL SHORTNESS OF BREATH *UNUSUAL BRUISING OR BLEEDING *URINARY PROBLEMS (pain or burning when urinating, or frequent urination) *BOWEL PROBLEMS (unusual diarrhea, constipation, pain near the anus) TENDERNESS IN MOUTH AND THROAT WITH OR WITHOUT PRESENCE OF ULCERS (sore throat, sores in mouth, or a toothache) UNUSUAL RASH, SWELLING OR PAIN  UNUSUAL VAGINAL DISCHARGE OR ITCHING   Items with * indicate a potential emergency and should be followed up as soon as possible or go to the Emergency Department if any problems should occur.  Please show the CHEMOTHERAPY ALERT CARD or IMMUNOTHERAPY  ALERT CARD at check-in to the Emergency Department and triage nurse.  Should you have questions after your visit or need to cancel or reschedule your appointment, please contact [facility]  Dept: [phone]  and follow the prompts.  Office hours are 8:00 a.m. to 4:30 p.m. Monday - Friday. Please note that voicemails left after 4:00 p.m. may not be returned until the following business day.  We are closed weekends and major holidays. You have access to a nurse at all times for urgent questions. Please call the main number to the clinic Dept: [phone] and follow the prompts.   For any non-urgent questions, you may also contact your provider using MyChart. We now offer e-Visits for anyone 6 and older to request care online for non-urgent symptoms. For details visit [email or web address].   Also download the MyChart app! Go to the app store, search "MyChart", open the app, select [facility], and log in with your MyChart username and password.  Pembrolizumab Injection What is this medication? PEMBROLIZUMAB (PEM broe LIZ ue mab) treats some types of cancer. It works by helping your immune system slow or stop the spread of cancer cells. It is a monoclonal antibody. This medicine may be used for other purposes; ask your health care provider or pharmacist if you have questions. COMMON BRAND NAME(S): Keytruda What should I tell my care team before I take this medication? They need to know if you have any of these conditions: Allogeneic stem cell transplant (uses someone else's stem cells) Autoimmune diseases, such as Crohn disease, ulcerative colitis, lupus History of chest radiation Nervous  system problems, such as Guillain-Barre syndrome, myasthenia gravis Organ transplant An unusual or allergic reaction to pembrolizumab, other medications, foods, dyes, or preservatives Pregnant or trying to get pregnant Breast-feeding How should I use this  medication? This medication is injected into a vein. It is given by your care team in a hospital or clinic setting. A special MedGuide will be given to you before each treatment. Be sure to read this information carefully each time. Talk to your care team about the use of this medication in children. While it may be prescribed for children as young as 6 months for selected conditions, precautions do apply. Overdosage: If you think you have taken too much of this medicine contact a poison control center or emergency room at once. NOTE: This medicine is only for you. Do not share this medicine with others. What if I miss a dose? Keep appointments for follow-up doses. It is important not to miss your dose. Call your care team if you are unable to keep an appointment. What may interact with this medication? Interactions have not been studied. This list may not describe all possible interactions. Give your health care provider a list of all the medicines, herbs, non-prescription drugs, or dietary supplements you use. Also tell them if you smoke, drink alcohol, or use illegal drugs. Some items may interact with your medicine. What should I watch for while using this medication? Your condition will be monitored carefully while you are receiving this medication. You may need blood work while taking this medication. This medication may cause serious skin reactions. They can happen weeks to months after starting the medication. Contact your care team right away if you notice fevers or flu-like symptoms with a rash. The rash may be red or purple and then turn into blisters or peeling of the skin. You may also notice a red rash with swelling of the face, lips, or lymph nodes in your neck or under your arms. Tell your care team right away if you have any change in your eyesight. Talk to your care team if you may be pregnant. Serious birth defects can occur if you take this medication during pregnancy and for 4  months after the last dose. You will need a negative pregnancy test before starting this medication. Contraception is recommended while taking this medication and for 4 months after the last dose. Your care team can help you find the option that works for you. Do not breastfeed while taking this medication and for 4 months after the last dose. What side effects may I notice from receiving this medication? Side effects that you should report to your care team as soon as possible: Allergic reactions--skin rash, itching, hives, swelling of the face, lips, tongue, or throat Dry cough, shortness of breath or trouble breathing Eye pain, redness, irritation, or discharge with blurry or decreased vision Heart muscle inflammation--unusual weakness or fatigue, shortness of breath, chest pain, fast or irregular heartbeat, dizziness, swelling of the ankles, feet, or hands Hormone gland problems--headache, sensitivity to light, unusual weakness or fatigue, dizziness, fast or irregular heartbeat, increased sensitivity to cold or heat, excessive sweating, constipation, hair loss, increased thirst or amount of urine, tremors or shaking, irritability Infusion reactions--chest pain, shortness of breath or trouble breathing, feeling faint or lightheaded Kidney injury (glomerulonephritis)--decrease in the amount of urine, red or dark brown urine, foamy or bubbly urine, swelling of the ankles, hands, or feet Liver injury--right upper belly pain, loss of appetite, nausea, light-colored stool, dark yellow or  brown urine, yellowing skin or eyes, unusual weakness or fatigue Pain, tingling, or numbness in the hands or feet, muscle weakness, change in vision, confusion or trouble speaking, loss of balance or coordination, trouble walking, seizures Rash, fever, and swollen lymph nodes Redness, blistering, peeling, or loosening of the skin, including inside the mouth Sudden or severe stomach pain, bloody diarrhea, fever, nausea,  vomiting Side effects that usually do not require medical attention (report to your care team if they continue or are bothersome): Bone, joint, or muscle pain Diarrhea Fatigue Loss of appetite Nausea Skin rash This list may not describe all possible side effects. Call your doctor for medical advice about side effects. You may report side effects to FDA at 1-800-FDA-1088. Where should I keep my medication? This medication is given in a hospital or clinic. It will not be stored at home. NOTE: This sheet is a summary. It may not cover all possible information. If you have questions about this medicine, talk to your doctor, pharmacist, or health care provider.  2024 Elsevier/Gold Standard (2022-02-11 00:00:00)

## 2023-08-14 ENCOUNTER — Other Ambulatory Visit: Payer: Self-pay

## 2023-08-18 ENCOUNTER — Ambulatory Visit (HOSPITAL_BASED_OUTPATIENT_CLINIC_OR_DEPARTMENT_OTHER): Payer: PPO

## 2023-08-19 ENCOUNTER — Telehealth: Payer: Self-pay

## 2023-08-19 ENCOUNTER — Ambulatory Visit (HOSPITAL_BASED_OUTPATIENT_CLINIC_OR_DEPARTMENT_OTHER)
Admission: RE | Admit: 2023-08-19 | Discharge: 2023-08-19 | Disposition: A | Discharge status: Home / Self Care | Payer: PPO | Source: Ambulatory Visit | Attending: Nurse Practitioner | Admitting: Nurse Practitioner

## 2023-08-19 DIAGNOSIS — E785 Hyperlipidemia, unspecified: Secondary | ICD-10-CM | POA: Diagnosis not present

## 2023-08-19 DIAGNOSIS — C439 Malignant melanoma of skin, unspecified: Secondary | ICD-10-CM | POA: Diagnosis not present

## 2023-08-19 DIAGNOSIS — C20 Malignant neoplasm of rectum: Secondary | ICD-10-CM | POA: Diagnosis not present

## 2023-08-19 DIAGNOSIS — R918 Other nonspecific abnormal finding of lung field: Secondary | ICD-10-CM | POA: Diagnosis not present

## 2023-08-19 DIAGNOSIS — E11610 Type 2 diabetes mellitus with diabetic neuropathic arthropathy: Secondary | ICD-10-CM | POA: Diagnosis not present

## 2023-08-19 DIAGNOSIS — C189 Malignant neoplasm of colon, unspecified: Secondary | ICD-10-CM | POA: Diagnosis not present

## 2023-08-19 DIAGNOSIS — E7800 Pure hypercholesterolemia, unspecified: Secondary | ICD-10-CM | POA: Diagnosis not present

## 2023-08-19 MED ORDER — IOHEXOL 300 MG/ML  SOLN
100.0000 mL | Freq: Once | INTRAMUSCULAR | Status: AC | PRN
  Administered 2023-08-19: 80 mL via INTRAVENOUS

## 2023-08-19 NOTE — Telephone Encounter (Signed)
Transition Care Management Follow-up Telephone Call Date of discharge and from where: Destiny [name] 10/7 How have you been since you were released from the hospital? Doing the same and following up with providers Any questions or concerns? No  Items Reviewed: Did the pt receive and understand the discharge instructions provided? Yes  Medications obtained and verified? Yes  Other? No  Any new allergies since your discharge? No  Dietary orders reviewed? No Do you have support at home? No     Follow up appointments reviewed:  PCP Hospital f/u appt confirmed? No  Scheduled to see  on  @ . Specialist Hospital f/u appt confirmed? Yes  Scheduled to see  on  @ . Are transportation arrangements needed? No  If their condition worsens, is the pt aware to call PCP or go to the Emergency Dept.? Yes Was the patient provided with contact information for the PCP's office or ED? Yes Was to pt encouraged to call back with questions or concerns? Yes

## 2023-08-20 ENCOUNTER — Encounter: Payer: Self-pay | Admitting: *Deleted

## 2023-08-24 ENCOUNTER — Ambulatory Visit (INDEPENDENT_AMBULATORY_CARE_PROVIDER_SITE_OTHER): Payer: PPO | Admitting: Neurology

## 2023-08-24 ENCOUNTER — Encounter: Payer: Self-pay | Admitting: Neurology

## 2023-08-24 VITALS — BP 135/79 | HR 79 | Ht 63.5 in | Wt 185.4 lb

## 2023-08-24 DIAGNOSIS — G43109 Migraine with aura, not intractable, without status migrainosus: Secondary | ICD-10-CM

## 2023-08-24 DIAGNOSIS — R351 Nocturia: Secondary | ICD-10-CM | POA: Diagnosis not present

## 2023-08-24 DIAGNOSIS — G43809 Other migraine, not intractable, without status migrainosus: Secondary | ICD-10-CM | POA: Diagnosis not present

## 2023-08-24 DIAGNOSIS — C20 Malignant neoplasm of rectum: Secondary | ICD-10-CM

## 2023-08-24 DIAGNOSIS — E66811 Obesity, class 1: Secondary | ICD-10-CM

## 2023-08-24 DIAGNOSIS — H5319 Other subjective visual disturbances: Secondary | ICD-10-CM

## 2023-08-24 DIAGNOSIS — Z9189 Other specified personal risk factors, not elsewhere classified: Secondary | ICD-10-CM | POA: Diagnosis not present

## 2023-08-24 DIAGNOSIS — F439 Reaction to severe stress, unspecified: Secondary | ICD-10-CM | POA: Diagnosis not present

## 2023-08-24 DIAGNOSIS — C4372 Malignant melanoma of left lower limb, including hip: Secondary | ICD-10-CM

## 2023-08-24 DIAGNOSIS — H539 Unspecified visual disturbance: Secondary | ICD-10-CM

## 2023-08-24 DIAGNOSIS — Z8669 Personal history of other diseases of the nervous system and sense organs: Secondary | ICD-10-CM

## 2023-08-24 NOTE — Progress Notes (Signed)
Subjective:    Patient ID: Destiny [name] is a 77 y.o. female.  HPI    Huston Foley, MD, PhD [facility] [address] [address] [place], [place] [address]  Dear Dr. Renne Crigler,  I saw your patient, Destiny [name], upon your kind request in my neurologic clinic today for initial consultation of her migraine headaches.  The patient is unaccompanied today.  As you know, [name] is a 76 year old female with an underlying complex medical history of diabetes, neuropathy, hypertension, hyperlipidemia, hypothyroidism, osteopenia, obesity, rectal cancer, melanoma, nephrolithiasis, allergic rhinitis, carotid artery stenosis on the left, vitamin D deficiency, and obstructive sleep apnea, who reports an almost 73-month history of recurrent visual distortion or abnormal vision.  She describes black and white wavy lines, moving pictures, as well as bright-colored changes in her vision particularly towards the right side and affecting the right eye but also still there when she closes the right eye.  She has seen ophthalmology twice, Dr. Laruth Bouchard office.  She has never had a migraine headache.  She did not have recurrent headaches growing up but her grandson has significant migraines.  She does endorse quite a bit of stress regarding her medical conditions including a history of rectal cancer with status post surgery and chemo/radiation.  She reports that she could not finish the chemotherapy as it flared up her melanoma in the left foot.  She had 3 surgeries to the left foot.  She had a recent biopsy.  She has inguinal lymphadenopathy on the left, suspicion for recurrence of melanoma or spread of her rectal or skin cancer.  She does not sleep very well.  She lives alone.  She still drives.  She avoids nighttime driving.  Sleep is interrupted, she also has nocturia about 2-3 times per average night.  She is not aware of any family history of sleep apnea.   Years ago she was diagnosed  with sleep apnea and could not tolerate CPAP therapy.  She has not used CPAP in over 5 years.  She does not hydrate very well.  She estimates that she drinks between 16 and 32 ounces of water per day.  She drinks limited caffeine, 1 cup of coffee in the morning and less than 16 ounces of diet soda per day.  She denies any sudden onset of one-sided weakness or numbness or tingling or droopy face or slurring of speech.    She has tried Maxalt as needed but it did not help her symptoms with the visual aura.    I reviewed your office records including office visit note from 07/09/2023 when she saw Evans Lance, NP.  She had blood work on 07/09/2023 and I reviewed the results.  ESR was normal at 19, CMP showed sodium mildly elevated at 145, chloride slightly elevated at 107, calcium below normal at 8.0, CRP normal at 0.1.  She has been to ophthalmology.  Of note, she has a history of apnea does not use a PAP machine. Of note, she had a brain MRI without contrast through [facility] health emergency room for indication of acute headache on 07/20/2023 and I reviewed the results:  IMPRESSION: 1. No acute intracranial abnormality. 2. 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 Sequelae of migrainous disorder would be the primary differential consideration given the history of headaches.    In addition, I personally and independently reviewed images through the PACS system.  Her Past Medical History  Is Significant For: Past Medical History:  Diagnosis Date   Actinic keratoses    Allergy    Ankle dislocation, left, initial encounter 09/23/2017   Arthritis    Blood transfusion    in 1980   Cancer (HCC)    left heel melanoma   Diabetes mellitus    History of colon polyps    History of kidney stones    Hyperlipidemia    Hypothyroidism    Left carotid artery stenosis    Neuropathy    Neuropathy    Onychomycosis     Osteopenia    Osteoporosis    osteopenia   Penicillin allergy    PONV (postoperative nausea and vomiting)    Proteinuria    Sleep apnea    does not use cpap   Thyroid disease    hypothyroidism   Vitamin D deficiency     Her Past Surgical History Is Significant For: Past Surgical History:  Procedure Laterality Date   APPLICATION OF A-CELL OF CHEST/ABDOMEN Left 04/24/2021   Procedure: APPLICATION OF A-CELL LEFT FOOT;  Surgeon: Peggye Form, DO;  Location: [facility] OR;  [name]: Plastics;  Laterality: Left;   APPLICATION OF A-CELL OF CHEST/ABDOMEN Left 05/22/2021   Procedure: POSSIBLE APPLICATION OF A-CELL;  Surgeon: Peggye Form, DO;  Location: [facility] OR;  [name]: Plastics;  Laterality: Left;   COLONOSCOPY     EXCISION MELANOMA WITH SENTINEL LYMPH NODE BIOPSY Left 03/26/2021   Procedure: WIDE LOCAL EXCISION MELANOMA LEFT HEEL, SENTINEL LYMPH NODE MAPPING AND BIOPSY;  Surgeon: Almond Lint, MD;  Location: [facility] OR;  [name]: General;  Laterality: Left;   EXTERNAL FIXATION LEG Left 09/24/2017   Procedure: EXTERNAL FIXATION ankle;  Surgeon: Roby Lofts, MD;  Location: [facility];  [name]: Orthopedics;  Laterality: Left;   EXTERNAL FIXATION REMOVAL Left 09/28/2017   Procedure: REMOVAL EXTERNAL FIXATION LEG;  Surgeon: Roby Lofts, MD;  Location: [facility];  [name]: Orthopedics;  Laterality: Left;   EYE SURGERY Bilateral    cataract   FLEXIBLE SIGMOIDOSCOPY  08/13/2022   Procedure: FLEXIBLE SIGMOIDOSCOPY;  Surgeon: Romie Levee, MD;  Location: [facility];  [name]: General;;   LAPAROSCOPIC GASTRIC BANDING  2010   MELANOMA EXCISION Left 04/24/2021   Procedure: REEXCISION MARGIN LEFT HEEL MELANOMA;  Surgeon: Almond Lint, MD;  Location: [facility];  [name]: General;  Laterality: Left;   MELANOMA EXCISION Left 05/22/2021   Procedure: RE-EXCISION OF LEFT HEEL MELANOMA;  Surgeon: Almond Lint, MD;  [facility]: [facility];  [name]: General;  Laterality: Left;   OPEN REDUCTION INTERNAL FIXATION  (ORIF) DISTAL RADIAL FRACTURE Right 07/29/2016   Procedure: RIGHT OPEN REDUCTION INTERNAL FIXATION (ORIF) DISTAL RADIAL FRACTURE;  Surgeon: Betha Loa, MD;  Location:  SURGERY CENTER;  [name]: Orthopedics;  Laterality: Right;   ORIF ANKLE FRACTURE Left 09/28/2017   Procedure: OPEN REDUCTION INTERNAL FIXATION (ORIF) ANKLE FRACTURE;  Surgeon: Roby Lofts, MD;  [facility]: [facility];  [name]: Orthopedics;  Laterality: Left;   POLYPECTOMY     SKIN DEBRIDEMENT Left 05/22/2021   Procedure: RECONSTRUCTION LEFT HEEL;  Surgeon: Peggye Form, DO;  [facility]: [facility];  [name]: Plastics;  Laterality: Left;   SKIN FULL THICKNESS GRAFT Left 04/24/2021   Procedure: RECONSTRUCTION OF LEFT HEEL;  Surgeon: Peggye Form, DO;  [facility]: [facility];  [name]: Plastics;  Laterality: Left;   SKIN FULL THICKNESS GRAFT Left 05/22/2021   Procedure: POSSIBLE SKIN GRAFT;  Surgeon: Peggye Form, DO;  Location: [facility];  [name]: Plastics;  Laterality: Left;   TOTAL ABDOMINAL HYSTERECTOMY W/ BILATERAL SALPINGOOPHORECTOMY  1980   WISDOM TOOTH EXTRACTION     XI ROBOTIC ASSISTED LOWER ANTERIOR RESECTION N/A 08/13/2022   Procedure: XI ROBOTIC ASSISTED LOWER ANTERIOR RESECTION, WITH INTRAOPERATIVE ASSESSMENT OF PERFUSION USING FIREFLY;  Surgeon: Romie Levee, MD;  Location: [facility];  [name]: General;  Laterality: N/A;    Her Family History Is Significant For: Family History  Problem Relation Age of Onset   Lung cancer Sister    Ovarian cancer Sister    Throat cancer Brother     Her Social History Is Significant For: Social History   Socioeconomic History   Marital status: Divorced    Spouse name: Not on file   Number of children: Not on file   Years of education: Not on file   Highest education level: Not on file  Occupational History   Not on file  Tobacco Use   Smoking status: Former    Current packs/day: 0.00    Average packs/day: 1 pack/day for 15.0 years (15.0 ttl pk-yrs)     Types: Cigarettes    Start date: 30    Quit date: 38    Years since quitting: 30.8   Smokeless tobacco: Never  Vaping Use   Vaping status: Never Used  Substance and Sexual Activity   Alcohol use: No   Drug use: No   Sexual activity: Never  Other Topics Concern   Not on file  Social History Narrative   Not on file   Social Determinants of Health   Financial Resource Strain: Not on file  Food Insecurity: No Food Insecurity (06/02/2023)   Hunger Vital Sign    Worried About Running Out of Food in the Last Year: Never true    Ran Out of Food in the Last Year: Never true  Transportation Needs: No Transportation Needs (06/02/2023)   PRAPARE - Administrator, Civil [name] (Medical): No    Lack of Transportation (Non-Medical): No  Physical Activity: Not on file  Stress: Not on file  Social Connections: Not on file    Her Allergies Are:  Allergies  Allergen Reactions   Aspirin Other (See Comments)    Stomach upset   Atorvastatin Other (See Comments)    cramps   Chlorhexidine Gluconate [Chlorhexidine] Itching   Crestor [Rosuvastatin]    Fentanyl Nausea Only   Lisinopril Other (See Comments)    dizziness   Penicillins Swelling    Has patient had a PCN reaction causing immediate rash, facial/tongue/throat swelling, SOB or lightheadedness with hypotension: Yes Has patient had a PCN reaction causing severe rash involving mucus membranes or skin necrosis: unknown Has patient had a PCN reaction that required hospitalization: No Has patient had a PCN reaction occurring within the last 10 years: No If all of the above answers are "NO", then may proceed with Cephalosporin use.    Hydrocodone Other (See Comments)    "Does not like how it makes me feel  :   Her Current Medications Are:  Outpatient Encounter Medications as of 08/24/2023  Medication Sig   acetaminophen (TYLENOL) 500 MG tablet Take 1,000 mg by mouth every 6 (six) hours as needed for moderate pain.    aspirin EC 81 MG tablet Take 81 mg by mouth in the morning.   Cholecalciferol (VITAMIN D3) 50 MCG (2000 UT) TABS Take 2,000 Units by mouth in the morning.   denosumab (PROLIA) 60 MG/ML SOSY injection Inject 60 mg into the skin every 6 (  six) months.   ezetimibe (ZETIA) 10 MG tablet Take 10 mg by mouth in the morning.   levothyroxine (SYNTHROID) 100 MCG tablet Take 100 mcg by mouth daily before breakfast.   metFORMIN (GLUCOPHAGE-XR) 500 MG 24 hr tablet Take 500 mg by mouth every evening.   Multiple Vitamins-Minerals (OCUVITE EXTRA) TABS Take 1 tablet by mouth in the morning and at bedtime.   ONETOUCH ULTRA test strip    pravastatin (PRAVACHOL) 40 MG tablet Take 40 mg by mouth every evening.   Semaglutide,0.25 or 0.5MG /DOS, (OZEMPIC, 0.25 OR 0.5 MG/DOSE,) 2 MG/3ML SOPN Inject 0.5 mg into the skin once a week.   No facility-administered encounter medications on file as of 08/24/2023.  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 Since Middle September.  Kaliedscope ophthalmologist/ oncologist, CT imaging. Oncology treatment (physician is not issue).  Pt question this.  No other symptoms.      Objective:  Neurological Exam  Physical Exam Physical Examination:   Vitals:   08/24/23 0810  BP: 135/79  Pulse: 79  SpO2: 97%    General Examination: The patient is a very pleasant 77 y.o. female in no acute distress. She appears well-developed and well-nourished and well groomed.   HEENT: Normocephalic, atraumatic, pupils are equal, round and reactive to light, extraocular tracking is good without limitation to gaze excursion or nystagmus noted.  No photophobia.  Status post cataract repairs.  Funduscopic exam benign.  Hearing is grossly  intact. Face is symmetric with normal facial animation. Speech is clear with no dysarthria noted. There is no hypophonia. There is no lip, neck/head, jaw or voice tremor. Neck is supple with full range of passive and active motion. There are no carotid bruits on auscultation. Oropharynx exam reveals: mild mouth dryness, adequate dental hygiene with full dentures on top, missing teeth on the bottom, has a partial for the bottom but not in place.  She has moderate airway crowding secondary to Mallampati class III, redundant soft palate, tip of uvula and tonsils not fully visualized even with phonation.  Neck circumference 14-3/8 inches.  Tongue protrudes centrally and palate elevates symmetrically.  Chest: Clear to auscultation without wheezing, rhonchi or crackles noted.  Heart: S1+S2+0, regular and normal without murmurs, rubs or gallops noted.   Abdomen: Soft, non-tender and non-distended.  Extremities: There is swelling around her left foot.  Her foot is in a boot without any socks.   Skin: Warm and dry without trophic changes noted.   Musculoskeletal: exam reveals arthritic changes in both hands.  Left foot in a boot/brace.  She walks without a walking aid.  Neurologically:  Mental status: The patient is awake, alert and oriented in all 4 spheres. Her immediate and remote memory, attention, language skills and fund of knowledge are appropriate. There is no evidence of aphasia, agnosia, apraxia or anomia. Speech is clear with normal prosody and enunciation. Thought process is linear. Mood is normal and affect is normal.  Cranial nerves II - XII are as described above under HEENT exam.  Motor exam: Normal bulk, strength and tone is noted. There is no obvious action or resting tremor.  No drift or rebound.  No postural tremor. Fine motor  skills and coordination: Intact finger taps, hand movements and rapid alternating patting in both upper extremities.    Cerebellar testing: No dysmetria or  intention tremor. There is no truncal or gait ataxia.  Normal finger-to-nose bilaterally. Reflexes diminished throughout. Sensory exam: intact to light touch in the upper and lower extremities.  Gait, station and balance: She stands without difficulty and does not require assistance.  She stands slightly wide-based, she walks with a slow gait, no shuffling, preserved arm swing.  She walks with a limp on her walking boot, no walking aid.    Assessment and Plan:   In summary, LEON LUSKY is a very pleasant 77 y.o.-year old female with an underlying complex medical history of diabetes, neuropathy, hypertension, hyperlipidemia, hypothyroidism, osteopenia, obesity, rectal cancer, melanoma, nephrolithiasis, allergic rhinitis, carotid artery stenosis on the left, vitamin D deficiency, and obstructive sleep apnea, who presents for evaluation of her visual symptoms for the past nearly 2 months, started in mid September.  She describes a ocular migraine but interestingly has never had migraines before and it is unusual for her to have a fairly constant visual symptom rather than sporadic or intermittent.  Symptoms are perplexing but we did talk about migraine headaches and typical triggers.  She has quite a bit of stress, she does not always hydrate well and does not sleep well.  She has a history of obstructive sleep apnea which has not been treated for years.  She is advised to proceed with a repeat brain MRI with and without contrast.  I would like to order a home sleep test and I explained to her that if she has obstructive sleep apnea I will likely recommend treatment with an AutoPap machine.  She may very well be eligible for new machine.  She would be willing to try.  She has tried as needed migraine medication in the form of rizatriptan.  We agreed to hold off on any new medications as she has potential other treatments coming up for her cancers.  She has several doctors appointments including this week.   She is advised to stay better hydrated with water and try to make enough time for sleep.  She is not keen on starting any new medications herself.  We will keep her posted as to her test results by phone call and consider a migraine preventative next and/or treatment for obstructive sleep apnea.  This was an extended visit of over 60 minutes with extended chart review involved, including outside record review, paper chart review, and addressing multiple problems.   We will plan a follow-up after testing.  I answered all her questions today and she was in agreement with our plan.   Thank you very much for allowing me to participate in the care of this nice patient. If I can be of any further assistance to you please do not hesitate to call me at [phone].  Sincerely,   Huston Foley, MD, PhD

## 2023-08-24 NOTE — Patient Instructions (Signed)
It was nice to meet you today.  Here is what we discussed and came up with our plan for you today:  We will repeat your brain MRI with contrast. I will order a home sleep test to reevaluate you for sleep apnea.  Not sleeping well and having underlying sleep apnea may very well contribute to your migraine auras.  It is unusual to have an ocular migraine persist continuously. We may consider a migraine preventative down the road. Please increase your water intake to about 48 to 64 ounces of water per day. Please try to get enough rest, 7 to 8 hours of sleep are recommended.  If you have obstructive sleep apnea, I will likely recommend treatment with an AutoPap machine.

## 2023-08-25 ENCOUNTER — Telehealth: Payer: Self-pay | Admitting: Neurology

## 2023-08-25 DIAGNOSIS — Z85820 Personal history of malignant melanoma of skin: Secondary | ICD-10-CM | POA: Diagnosis not present

## 2023-08-25 DIAGNOSIS — E785 Hyperlipidemia, unspecified: Secondary | ICD-10-CM | POA: Diagnosis not present

## 2023-08-25 DIAGNOSIS — Z23 Encounter for immunization: Secondary | ICD-10-CM | POA: Diagnosis not present

## 2023-08-25 DIAGNOSIS — E039 Hypothyroidism, unspecified: Secondary | ICD-10-CM | POA: Diagnosis not present

## 2023-08-25 DIAGNOSIS — E11610 Type 2 diabetes mellitus with diabetic neuropathic arthropathy: Secondary | ICD-10-CM | POA: Diagnosis not present

## 2023-08-25 DIAGNOSIS — M810 Age-related osteoporosis without current pathological fracture: Secondary | ICD-10-CM | POA: Diagnosis not present

## 2023-08-25 NOTE — Telephone Encounter (Signed)
MR brain w/wo sent to GI for scheduling, no auth req for HTA. [phone]

## 2023-08-27 ENCOUNTER — Inpatient Hospital Stay: Payer: PPO | Attending: Oncology | Admitting: Oncology

## 2023-08-27 ENCOUNTER — Other Ambulatory Visit: Payer: Self-pay

## 2023-08-27 ENCOUNTER — Inpatient Hospital Stay: Payer: PPO

## 2023-08-27 ENCOUNTER — Encounter: Payer: Self-pay | Admitting: Oncology

## 2023-08-27 VITALS — BP 137/64 | HR 69 | Temp 98.1°F | Resp 18 | Ht 63.0 in | Wt 187.0 lb

## 2023-08-27 DIAGNOSIS — C20 Malignant neoplasm of rectum: Secondary | ICD-10-CM | POA: Insufficient documentation

## 2023-08-27 DIAGNOSIS — Z85820 Personal history of malignant melanoma of skin: Secondary | ICD-10-CM | POA: Insufficient documentation

## 2023-08-27 DIAGNOSIS — Z5112 Encounter for antineoplastic immunotherapy: Secondary | ICD-10-CM | POA: Diagnosis not present

## 2023-08-27 DIAGNOSIS — C439 Malignant melanoma of skin, unspecified: Secondary | ICD-10-CM | POA: Diagnosis not present

## 2023-08-27 DIAGNOSIS — E119 Type 2 diabetes mellitus without complications: Secondary | ICD-10-CM | POA: Insufficient documentation

## 2023-08-27 NOTE — Progress Notes (Signed)
[facility] OFFICE PROGRESS NOTE   Diagnosis: Melanoma  INTERVAL HISTORY:   Destiny [name] completed another treatment with pembrolizumab 08/13/2023.  No rash or diarrhea.  She feels the left groin mass has enlarged.  The lesion now feels "heavy ".  She also feels the left heel lesion is larger.  Objective:  Vital signs in last 24 hours:  Blood pressure 137/64, pulse 69, temperature 98.1 F (36.7 C), temperature source Temporal, resp. rate 18, height 5\' 3"  (1.6 m), weight 187 lb (84.8 kg), SpO2 97%.    Lymphatics: No cervical, supraclavicular, axillary, or right inguinal nodes.  Firm left inguinal mass appears larger.  No popliteal node or mass. Resp: Lungs clear bilaterally Cardio: Regular rate and rhythm GI: No hepatosplenomegaly Vascular: No leg edema  Skin: 4-5 cm ulcerated area at the left heel with overlying eschar and a larger area of subcutaneous firmness  Portacath/PICC-without erythema  Lab Results:  Lab Results  Component Value Date   WBC 6.0 08/13/2023   HGB 12.8 08/13/2023   HCT 39.8 08/13/2023   MCV 89.4 08/13/2023   PLT 265 08/13/2023   NEUTROABS 4.6 08/13/2023    CMP  Lab Results  Component Value Date   NA 140 08/13/2023   K 4.0 08/13/2023   CL 105 08/13/2023   CO2 27 08/13/2023   GLUCOSE 93 08/13/2023   BUN 12 08/13/2023   CREATININE 0.80 08/13/2023   CALCIUM 8.9 08/13/2023   PROT 6.7 08/13/2023   ALBUMIN 4.5 08/13/2023   AST 10 (L) 08/13/2023   ALT 6 08/13/2023   ALKPHOS 55 08/13/2023   BILITOT 0.6 08/13/2023   GFRNONAA >60 08/13/2023   GFRAA >60 09/24/2017    Lab Results  Component Value Date   CEA 2.19 03/02/2023    Lab Results  Component Value Date   INR 1.0 03/26/2021   LABPROT 13.5 03/26/2021    Medications: I have reviewed the patient's current medications.   Assessment/Plan: Rectal cancer Colonoscopy 03/06/2022-nonobstructing mass at 12 cm from the anal verge-biopsy invasive moderately differentiated  adenocarcinoma arising within a tubular adenoma with high-grade dysplasia CTs 03/13/2022-no rectal mass seen, no evidence of metastatic disease, small 2-4 mm pulmonary nodules-at least 2 are calcified, likely benign granulomas, nonobstructing stone in the interpolar right renal collecting system MRI pelvis 03/30/2022-tumor at 11-13 cm from the anal verge, T3b, early EMVI?,  No adenopathy Neoadjuvant radiation/capecitabine 04/21/2022-05/28/2022 Xeloda discontinued 05/15/2022 due to progressive breakdown/ulceration at the left heel. Low anterior resection 08/13/2022-negative for residual cancer,[id number], 0/14 nodes margins negative, no distinct mass, 2.1 x 1.5 cm minimally ulcerated firm area of mucosa   2.   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 (reexcision 04/24/2021 and 05/22/2021, final margins clear,pT3b,pN0, ulceration present, satellitosis absent, lymphovascular invasion absent, neurotropism present, tumor infiltrating lymphocytes-nonbrisk, tumor regression absent Nonhealing left heel ulceration beginning August 2023, followed at the wound clinic Punch biopsy of left heel wound 05/11/2023-malignant melanoma in the dermis extending to the deep margins with no clear connection to the epidermis, consistent with local recurrence of melanoma, BRAF V600 negative FNA biopsy of left inguinal lymph node 05/21/2023-atypical cells present, SOX-10 negative PET 819 2024-moderately FDG avid0.7 cm left popliteal node, and 5.1 x 5.2 cm left groin mass, avidity diffusely involving the left heel melanoma resection site Cycle 1 Pembrolizumab 06/12/2023 Cycle 2 Pembrolizumab 07/03/2023 Cycle 3 pembrolizumab 07/23/2023 Cycle 4 Pembrolizumab 08/13/2023 CTs 08/19/2023-increased size of previously hypermetabolic left inguinal mass, no evidence of new metastatic disease, stable tiny pulmonary nodules-favor  benign   3.  Diabetes   4.  Laparoscopic gastric band surgery   5.  Left heel  ulceration-progressive 05/15/2022.  Xeloda discontinued.  Followed at the wound clinic.        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  She has completed 4 cycles of pembrolizumab.  There is clinical and radiologic evidence of disease progression.  The left groin mass and tumor at the left heel appear larger.  I reviewed the CT images with Destiny [name] and her son.  Pembrolizumab will be discontinued. I discussed treatment options with Destiny [name].  I recommend ipilimumab/nivolumab in this BRAF wild-type tumor.  We reviewed potential toxicities associated with this regimen including the chance of various autoimmune toxicities, diarrhea and pneumonitis.  She agrees to proceed.  She will be scheduled for cycle 1 on 09/03/2023.  I will contact Dr. Lenis Noon to be sure he is in agreement with second line systemic therapy as opposed to proceeding with surgery.  A treatment plan was entered today.  Thornton Papas, MD  08/27/2023  3:09 PM

## 2023-08-27 NOTE — Progress Notes (Signed)
DISCONTINUE ON PATHWAY REGIMEN - Melanoma and Other Skin Cancers     A cycle is every 21 days:     Pembrolizumab   **Always confirm dose/schedule in your pharmacy ordering system**  REASON: Disease Progression PRIOR TREATMENT: [id number]: Pembrolizumab 200 mg q21 Days x 3 Doses, Followed by Surgery TREATMENT RESPONSE: Progressive Disease (PD)  START ON PATHWAY REGIMEN - Melanoma and Other Skin Cancers     Cycles 1 through 4: A cycle is every 21 days:     Nivolumab      Ipilimumab    Cycles 5 and beyond: A cycle is every 28 days:     Nivolumab   **Always confirm dose/schedule in your pharmacy ordering system**  Patient Characteristics: Melanoma, Cutaneous/Unknown Primary, Local/In Transit/Nodal Recurrence, Unresectable, Systemic Therapy Indicated, BRAF V600 Wild Type / BRAF V600 Results Pending or Unknown Disease Classification: Melanoma Disease Subtype: Cutaneous BRAF V600 Mutation Status: BRAF V600 Wild Type (No Mutation) Therapeutic Status: Local/In Transit/Nodal Recurrence Type of Therapy: Systemic Therapy Indicated Intent of Therapy: Non-Curative / Palliative Intent, Discussed with Patient

## 2023-08-28 ENCOUNTER — Encounter: Payer: Self-pay | Admitting: Oncology

## 2023-08-28 ENCOUNTER — Other Ambulatory Visit: Payer: Self-pay

## 2023-08-28 ENCOUNTER — Other Ambulatory Visit: Payer: Self-pay | Admitting: *Deleted

## 2023-08-28 DIAGNOSIS — C439 Malignant melanoma of skin, unspecified: Secondary | ICD-10-CM

## 2023-08-30 ENCOUNTER — Other Ambulatory Visit: Payer: Self-pay | Admitting: Oncology

## 2023-09-01 ENCOUNTER — Telehealth: Payer: Self-pay | Admitting: Neurology

## 2023-09-01 NOTE — Telephone Encounter (Signed)
HST- HTA pending faxed notes.

## 2023-09-02 ENCOUNTER — Encounter: Payer: Self-pay | Admitting: Oncology

## 2023-09-02 NOTE — Telephone Encounter (Signed)
Telephone call  

## 2023-09-03 ENCOUNTER — Inpatient Hospital Stay: Payer: PPO

## 2023-09-03 VITALS — BP 112/63 | HR 69 | Temp 98.1°F | Resp 18 | Ht 64.0 in | Wt 185.9 lb

## 2023-09-03 DIAGNOSIS — C20 Malignant neoplasm of rectum: Secondary | ICD-10-CM

## 2023-09-03 DIAGNOSIS — C439 Malignant melanoma of skin, unspecified: Secondary | ICD-10-CM

## 2023-09-03 DIAGNOSIS — Z5112 Encounter for antineoplastic immunotherapy: Secondary | ICD-10-CM | POA: Diagnosis not present

## 2023-09-03 LAB — CMP (CANCER CENTER ONLY)
ALT: 10 U/L (ref 0–44)
AST: 12 U/L — ABNORMAL LOW (ref 15–41)
Albumin: 4.4 g/dL (ref 3.5–5.0)
Alkaline Phosphatase: 49 U/L (ref 38–126)
Anion gap: 10 (ref 5–15)
BUN: 22 mg/dL (ref 8–23)
CO2: 26 mmol/L (ref 22–32)
Calcium: 8.7 mg/dL — ABNORMAL LOW (ref 8.9–10.3)
Chloride: 103 mmol/L (ref 98–111)
Creatinine: 0.81 mg/dL (ref 0.44–1.00)
GFR, Estimated: >60 mL/min (ref >60)
Glucose, Bld: 128 mg/dL — ABNORMAL HIGH (ref 70–99)
Potassium: 3.5 mmol/L (ref 3.5–5.1)
Sodium: 139 mmol/L (ref 135–145)
Total Bilirubin: 0.5 mg/dL (ref <1.2)
Total Protein: 7.2 g/dL (ref 6.5–8.1)

## 2023-09-03 LAB — CBC WITH DIFFERENTIAL (CANCER CENTER ONLY)
Abs Immature Granulocytes: 0.03 10*3/uL (ref 0.00–0.07)
Basophils Absolute: 0 10*3/uL (ref 0.0–0.1)
Basophils Relative: 0 %
Eosinophils Absolute: 0.1 10*3/uL (ref 0.0–0.5)
Eosinophils Relative: 2 %
HCT: 41.1 % (ref 36.0–46.0)
Hemoglobin: 13.1 g/dL (ref 12.0–15.0)
Immature Granulocytes: 1 %
Lymphocytes Relative: 16 %
Lymphs Abs: 0.9 10*3/uL (ref 0.7–4.0)
MCH: 28.4 pg (ref 26.0–34.0)
MCHC: 31.9 g/dL (ref 30.0–36.0)
MCV: 89.2 fL (ref 80.0–100.0)
Monocytes Absolute: 0.3 10*3/uL (ref 0.1–1.0)
Monocytes Relative: 6 %
Neutro Abs: 4.6 10*3/uL (ref 1.7–7.7)
Neutrophils Relative %: 75 %
Platelet Count: 261 10*3/uL (ref 150–400)
RBC: 4.61 MIL/uL (ref 3.87–5.11)
RDW: 14.7 % (ref 11.5–15.5)
WBC Count: 6 10*3/uL (ref 4.0–10.5)
nRBC: 0 % (ref 0.0–0.2)

## 2023-09-03 LAB — TSH: TSH: 6.231 u[IU]/mL — ABNORMAL HIGH (ref 0.350–4.500)

## 2023-09-03 LAB — CEA (ACCESS): CEA (CHCC): 1.61 ng/mL (ref 0.00–5.00)

## 2023-09-03 MED ORDER — SODIUM CHLORIDE 0.9 % IV SOLN
1.0000 mg/kg | Freq: Once | INTRAVENOUS | Status: AC
  Administered 2023-09-03: 85 mg via INTRAVENOUS
  Filled 2023-09-03: qty 17

## 2023-09-03 MED ORDER — DIPHENHYDRAMINE HCL 50 MG/ML IJ SOLN
25.0000 mg | Freq: Once | INTRAMUSCULAR | Status: AC
  Administered 2023-09-03: 25 mg via INTRAVENOUS
  Filled 2023-09-03: qty 1

## 2023-09-03 MED ORDER — SODIUM CHLORIDE 0.9 % IV SOLN: INTRAVENOUS | Status: DC

## 2023-09-03 MED ORDER — SODIUM CHLORIDE 0.9 % IV SOLN
240.0000 mg | Freq: Once | INTRAVENOUS | Status: AC
  Administered 2023-09-03: 240 mg via INTRAVENOUS
  Filled 2023-09-03: qty 24

## 2023-09-03 MED ORDER — FAMOTIDINE IN NACL 20-0.9 MG/50ML-% IV SOLN
20.0000 mg | Freq: Once | INTRAVENOUS | Status: AC
  Administered 2023-09-03: 20 mg via INTRAVENOUS
  Filled 2023-09-03: qty 50

## 2023-09-03 NOTE — Progress Notes (Signed)

## 2023-09-03 NOTE — Patient Instructions (Signed)
[facility] - A DEPT OF [facility]  Discharge Instructions: Thank you for choosing [facility] to provide your oncology and hematology care.   If you have a lab appointment with the Cancer Center, please go directly to the Cancer Center and check in at the registration area.   Wear comfortable clothing and clothing appropriate for easy access to any Portacath or PICC line.   We strive to give you quality time with your provider. You may need to reschedule your appointment if you arrive late (15 or more minutes).  Arriving late affects you and other patients whose appointments are after yours.  Also, if you miss three or more appointments without notifying the office, you may be dismissed from the clinic at the provider's discretion.      For prescription refill requests, have your pharmacy contact our office and allow 72 hours for refills to be completed.    Today you received the following chemotherapy and/or immunotherapy agents: nivolumab, ipilimumab     To help prevent nausea and vomiting after your treatment, we encourage you to take your nausea medication as directed.  BELOW ARE SYMPTOMS THAT SHOULD BE REPORTED IMMEDIATELY: *FEVER GREATER THAN 100.4 F (38 C) OR HIGHER *CHILLS OR SWEATING *NAUSEA AND VOMITING THAT IS NOT CONTROLLED WITH YOUR NAUSEA MEDICATION *UNUSUAL SHORTNESS OF BREATH *UNUSUAL BRUISING OR BLEEDING *URINARY PROBLEMS (pain or burning when urinating, or frequent urination) *BOWEL PROBLEMS (unusual diarrhea, constipation, pain near the anus) TENDERNESS IN MOUTH AND THROAT WITH OR WITHOUT PRESENCE OF ULCERS (sore throat, sores in mouth, or a toothache) UNUSUAL RASH, SWELLING OR PAIN  UNUSUAL VAGINAL DISCHARGE OR ITCHING   Items with * indicate a potential emergency and should be followed up as soon as possible or go to the Emergency Department if any problems should occur.  Please show the CHEMOTHERAPY ALERT CARD or  IMMUNOTHERAPY ALERT CARD at check-in to the Emergency Department and triage nurse.  Should you have questions after your visit or need to cancel or reschedule your appointment, please contact [facility] - A DEPT OF [facility]  Dept: [phone]  and follow the prompts.  Office hours are 8:00 a.m. to 4:30 p.m. Monday - Friday. Please note that voicemails left after 4:00 p.m. may not be returned until the following business day.  We are closed weekends and major holidays. You have access to a nurse at all times for urgent questions. Please call the main number to the clinic Dept: [phone] and follow the prompts.   For any non-urgent questions, you may also contact your provider using MyChart. We now offer e-Visits for anyone 82 and older to request care online for non-urgent symptoms. For details visit [email or web address].   Also download the MyChart app! Go to the app store, search "MyChart", open the app, select [facility], and log in with your MyChart username and password.  Nivolumab Injection What is this medication? NIVOLUMAB (nye VOL ue mab) treats some types of cancer. It works by helping your immune system slow or stop the spread of cancer cells. It is a monoclonal antibody. This medicine may be used for other purposes; ask your health care provider or pharmacist if you have questions. COMMON BRAND NAME(S): Opdivo What should I tell my care team before I take this medication? They need to know if you have any of these conditions: Allogeneic stem cell transplant (uses someone else's stem cells) Autoimmune diseases, such as Crohn  disease, ulcerative colitis, lupus History of chest radiation Nervous system problems, such as Guillain-Barre syndrome or myasthenia gravis Organ transplant An unusual or allergic reaction to nivolumab, other medications, foods, dyes, or preservatives Pregnant or trying to get pregnant Breast-feeding How should I use  this medication? This medication is infused into a vein. It is given in a hospital or clinic setting. A special MedGuide will be given to you before each treatment. Be sure to read this information carefully each time. Talk to your care team about the use of this medication in children. While it may be prescribed for children as young as 12 years for selected conditions, precautions do apply. Overdosage: If you think you have taken too much of this medicine contact a poison control center or emergency room at once. NOTE: This medicine is only for you. Do not share this medicine with others. What if I miss a dose? Keep appointments for follow-up doses. It is important not to miss your dose. Call your care team if you are unable to keep an appointment. What may interact with this medication? Interactions have not been studied. This list may not describe all possible interactions. Give your health care provider a list of all the medicines, herbs, non-prescription drugs, or dietary supplements you use. Also tell them if you smoke, drink alcohol, or use illegal drugs. Some items may interact with your medicine. What should I watch for while using this medication? Your condition will be monitored carefully while you are receiving this medication. You may need blood work while taking this medication. This medication may cause serious skin reactions. They can happen weeks to months after starting the medication. Contact your care team right away if you notice fevers or flu-like symptoms with a rash. The rash may be red or purple and then turn into blisters or peeling of the skin. You may also notice a red rash with swelling of the face, lips, or lymph nodes in your neck or under your arms. Tell your care team right away if you have any change in your eyesight. Talk to your care team if you are pregnant or think you might be pregnant. A negative pregnancy test is required before starting this medication. A  reliable form of contraception is recommended while taking this medication and for 5 months after the last dose. Talk to your care team about effective forms of contraception. Do not breast-feed while taking this medication and for 5 months after the last dose. What side effects may I notice from receiving this medication? Side effects that you should report to your care team as soon as possible: Allergic reactions--skin rash, itching, hives, swelling of the face, lips, tongue, or throat Dry cough, shortness of breath or trouble breathing Eye pain, redness, irritation, or discharge with blurry or decreased vision Heart muscle inflammation--unusual weakness or fatigue, shortness of breath, chest pain, fast or irregular heartbeat, dizziness, swelling of the ankles, feet, or hands Hormone gland problems--headache, sensitivity to light, unusual weakness or fatigue, dizziness, fast or irregular heartbeat, increased sensitivity to cold or heat, excessive sweating, constipation, hair loss, increased thirst or amount of urine, tremors or shaking, irritability Infusion reactions--chest pain, shortness of breath or trouble breathing, feeling faint or lightheaded Kidney injury (glomerulonephritis)--decrease in the amount of urine, red or dark brown urine, foamy or bubbly urine, swelling of the ankles, hands, or feet Liver injury--right upper belly pain, loss of appetite, nausea, light-colored stool, dark yellow or brown urine, yellowing skin or eyes, unusual weakness or  fatigue Pain, tingling, or numbness in the hands or feet, muscle weakness, change in vision, confusion or trouble speaking, loss of balance or coordination, trouble walking, seizures Rash, fever, and swollen lymph nodes Redness, blistering, peeling, or loosening of the skin, including inside the mouth Sudden or severe stomach pain, bloody diarrhea, fever, nausea, vomiting Side effects that usually do not require medical attention (report these  to your care team if they continue or are bothersome): Bone, joint, or muscle pain Diarrhea Fatigue Loss of appetite Nausea Skin rash This list may not describe all possible side effects. Call your doctor for medical advice about side effects. You may report side effects to FDA at 1-800-FDA-1088. Where should I keep my medication? This medication is given in a hospital or clinic. It will not be stored at home. NOTE: This sheet is a summary. It may not cover all possible information. If you have questions about this medicine, talk to your doctor, pharmacist, or health care provider.  2024 Elsevier/Gold Standard (2022-01-27 00:00:00) Ipilimumab Injection What is this medication? IPILIMUMAB (IP i LIM ue mab) treats some types of cancer. It works by helping your immune system slow or stop the spread of cancer cells. It is a monoclonal antibody. This medicine may be used for other purposes; ask your health care provider or pharmacist if you have questions. COMMON BRAND NAME(S): YERVOY What should I tell my care team before I take this medication? They need to know if you have any of these conditions: Allogeneic stem cell transplant (uses someone else's stem cells) Autoimmune diseases, such as Crohn disease, ulcerative colitis, lupus Nervous system problems, such as Guillain-Barre syndrome or myasthenia gravis Organ transplant An unusual or allergic reaction to ipilimumab, other medications, foods, dyes, or preservatives Pregnant or trying to get pregnant Breast-feeding How should I use this medication? This medication is infused into a vein. It is given by your care team in a hospital or clinic setting. A special MedGuide will be given to you before each treatment. Be sure to read this information carefully each time. Talk to your care team about the use of this medication in children. While it may be prescribed for children as young as 12 years for selected conditions, precautions do  apply. Overdosage: If you think you have taken too much of this medicine contact a poison control center or emergency room at once. NOTE: This medicine is only for you. Do not share this medicine with others. What if I miss a dose? Keep appointments for follow-up doses. It is important not to miss your dose. Call your care team if you are unable to keep an appointment. What may interact with this medication? Interactions are not expected. This list may not describe all possible interactions. Give your health care provider a list of all the medicines, herbs, non-prescription drugs, or dietary supplements you use. Also tell them if you smoke, drink alcohol, or use illegal drugs. Some items may interact with your medicine. What should I watch for while using this medication? Your condition will be monitored carefully while you are receiving this medication. You may need blood work while taking this medication. This medication may cause serious skin reactions. They can happen weeks to months after starting the medication. Contact your care team right away if you notice fevers or flu-like symptoms with a rash. The rash may be red or purple and then turn into blisters or peeling of the skin. You may also notice a red rash with swelling of the face,  lips, or lymph nodes in your neck or under your arms. Tell your care team right away if you have any change in your eyesight. Talk to your care team if you may be pregnant. Serious birth defects can occur if you take this medication during pregnancy and for 3 months after the last dose. You will need a negative pregnancy test before starting this medication. Contraception is recommended while taking this medication and for 3 months after the last dose. Your care team can help you find the option that works for you. Do not breastfeed while taking this medication and for 3 months after the last dose. What side effects may I notice from receiving this  medication? Side effects that you should report to your care team as soon as possible: Allergic reactions--skin rash, itching, hives, swelling of the face, lips, tongue, or throat Dry cough, shortness of breath or trouble breathing Eye pain, redness, irritation, or discharge with blurry or decreased vision Heart muscle inflammation--unusual weakness or fatigue, shortness of breath, chest pain, fast or irregular heartbeat, dizziness, swelling of the ankles, feet, or hands Hormone gland problems--headache, sensitivity to light, unusual weakness or fatigue, dizziness, fast or irregular heartbeat, increased sensitivity to cold or heat, excessive sweating, constipation, hair loss, increased thirst or amount of urine, tremors or shaking, irritability Infusion reactions--chest pain, shortness of breath or trouble breathing, feeling faint or lightheaded Kidney injury (glomerulonephritis)--decrease in the amount of urine, red or dark brown urine, foamy or bubbly urine, swelling of the ankles, hands, or feet Liver injury--right upper belly pain, loss of appetite, nausea, light-colored stool, dark yellow or brown urine, yellowing skin or eyes, unusual weakness or fatigue Pain, tingling, or numbness in the hands or feet, muscle weakness, change in vision, confusion or trouble speaking, loss of balance or coordination, trouble walking, seizures Rash, fever, and swollen lymph nodes Redness, blistering, peeling, or loosening of the skin, including inside the mouth Sudden or severe stomach pain, bloody diarrhea, fever, nausea, vomiting Side effects that usually do not require medical attention (report to your care team if they continue or are bothersome): Bone, joint, or muscle pain Diarrhea Fatigue Loss of appetite Nausea Skin rash This list may not describe all possible side effects. Call your doctor for medical advice about side effects. You may report side effects to FDA at 1-800-FDA-1088. Where should I  keep my medication? This medication is given in a hospital or clinic. It will not be stored at home. NOTE: This sheet is a summary. It may not cover all possible information. If you have questions about this medicine, talk to your doctor, pharmacist, or health care provider.  2024 Elsevier/Gold Standard (2022-02-14 00:00:00)

## 2023-09-04 ENCOUNTER — Telehealth: Payer: Self-pay

## 2023-09-04 LAB — T4: T4, Total: 7.5 ug/dL (ref 4.5–12.0)

## 2023-09-04 NOTE — Telephone Encounter (Signed)
HST HTA Berkley Harvey: [id number] (exp. 09/01/23 to 11/30/23)

## 2023-09-04 NOTE — Telephone Encounter (Signed)
Called patient for first time chemo follow-up.  Patient stated she tolerated treatment well with no issues.  Patient stated she was tired when she went home after treatment yesterday, but feels fine this AM.  Patient did have some restless leg after receiving benadryl as a premed for her treatment, but patient stated it subsided after she went home.  Patient wanted to know if this would happen with each treatment.  Patient informed that it was most likely the benadryl that caused the restless leg and that it would most likely happen with each dose.  Informed patient Dr. Truett Perna would be made aware of the restless leg and that she can discuss this with him at her next appointment.  Patient verbalized understanding.  All questions were answered during phone call.

## 2023-09-10 ENCOUNTER — Other Ambulatory Visit: Payer: Self-pay

## 2023-09-14 ENCOUNTER — Other Ambulatory Visit: Payer: Self-pay

## 2023-09-18 ENCOUNTER — Ambulatory Visit
Admission: RE | Admit: 2023-09-18 | Discharge: 2023-09-18 | Disposition: A | Discharge status: Home / Self Care | Payer: PPO | Source: Ambulatory Visit | Attending: Neurology | Admitting: Neurology

## 2023-09-18 DIAGNOSIS — C20 Malignant neoplasm of rectum: Secondary | ICD-10-CM | POA: Diagnosis not present

## 2023-09-18 DIAGNOSIS — E66811 Obesity, class 1: Secondary | ICD-10-CM | POA: Diagnosis not present

## 2023-09-18 DIAGNOSIS — H539 Unspecified visual disturbance: Secondary | ICD-10-CM | POA: Diagnosis not present

## 2023-09-18 DIAGNOSIS — F439 Reaction to severe stress, unspecified: Secondary | ICD-10-CM

## 2023-09-18 DIAGNOSIS — R351 Nocturia: Secondary | ICD-10-CM

## 2023-09-18 DIAGNOSIS — C4372 Malignant melanoma of left lower limb, including hip: Secondary | ICD-10-CM

## 2023-09-18 DIAGNOSIS — Z9189 Other specified personal risk factors, not elsewhere classified: Secondary | ICD-10-CM

## 2023-09-18 DIAGNOSIS — H5319 Other subjective visual disturbances: Secondary | ICD-10-CM | POA: Diagnosis not present

## 2023-09-18 DIAGNOSIS — G43109 Migraine with aura, not intractable, without status migrainosus: Secondary | ICD-10-CM | POA: Diagnosis not present

## 2023-09-18 DIAGNOSIS — G43809 Other migraine, not intractable, without status migrainosus: Secondary | ICD-10-CM

## 2023-09-18 DIAGNOSIS — Z8669 Personal history of other diseases of the nervous system and sense organs: Secondary | ICD-10-CM

## 2023-09-18 MED ORDER — GADOPICLENOL 0.5 MMOL/ML IV SOLN
8.5000 mL | Freq: Once | INTRAVENOUS | Status: AC | PRN
  Administered 2023-09-18: 8.5 mL via INTRAVENOUS

## 2023-09-20 ENCOUNTER — Other Ambulatory Visit: Payer: Self-pay | Admitting: Oncology

## 2023-09-20 DIAGNOSIS — C439 Malignant melanoma of skin, unspecified: Secondary | ICD-10-CM

## 2023-09-24 ENCOUNTER — Inpatient Hospital Stay: Payer: PPO

## 2023-09-24 ENCOUNTER — Encounter: Payer: Self-pay | Admitting: Nurse Practitioner

## 2023-09-24 ENCOUNTER — Inpatient Hospital Stay (HOSPITAL_BASED_OUTPATIENT_CLINIC_OR_DEPARTMENT_OTHER): Payer: PPO | Admitting: Nurse Practitioner

## 2023-09-24 ENCOUNTER — Inpatient Hospital Stay: Payer: PPO | Attending: Oncology

## 2023-09-24 VITALS — BP 103/74 | HR 66 | Resp 18

## 2023-09-24 VITALS — BP 121/71 | HR 62 | Temp 98.2°F | Resp 18 | Ht 64.0 in | Wt 186.8 lb

## 2023-09-24 DIAGNOSIS — C439 Malignant melanoma of skin, unspecified: Secondary | ICD-10-CM | POA: Diagnosis not present

## 2023-09-24 DIAGNOSIS — Z9884 Bariatric surgery status: Secondary | ICD-10-CM | POA: Diagnosis not present

## 2023-09-24 DIAGNOSIS — Z5112 Encounter for antineoplastic immunotherapy: Secondary | ICD-10-CM | POA: Insufficient documentation

## 2023-09-24 DIAGNOSIS — C4372 Malignant melanoma of left lower limb, including hip: Secondary | ICD-10-CM | POA: Diagnosis not present

## 2023-09-24 DIAGNOSIS — R197 Diarrhea, unspecified: Secondary | ICD-10-CM | POA: Diagnosis not present

## 2023-09-24 DIAGNOSIS — G2581 Restless legs syndrome: Secondary | ICD-10-CM | POA: Diagnosis not present

## 2023-09-24 DIAGNOSIS — C20 Malignant neoplasm of rectum: Secondary | ICD-10-CM | POA: Insufficient documentation

## 2023-09-24 DIAGNOSIS — E119 Type 2 diabetes mellitus without complications: Secondary | ICD-10-CM | POA: Diagnosis not present

## 2023-09-24 LAB — CMP (CANCER CENTER ONLY)
ALT: 8 U/L (ref 0–44)
AST: 9 U/L — ABNORMAL LOW (ref 15–41)
Albumin: 4.3 g/dL (ref 3.5–5.0)
Alkaline Phosphatase: 57 U/L (ref 38–126)
Anion gap: 9 (ref 5–15)
BUN: 19 mg/dL (ref 8–23)
CO2: 29 mmol/L (ref 22–32)
Calcium: 9.3 mg/dL (ref 8.9–10.3)
Chloride: 105 mmol/L (ref 98–111)
Creatinine: 0.83 mg/dL (ref 0.44–1.00)
GFR, Estimated: >60 mL/min (ref >60)
Glucose, Bld: 95 mg/dL (ref 70–99)
Potassium: 4.2 mmol/L (ref 3.5–5.1)
Sodium: 143 mmol/L (ref 135–145)
Total Bilirubin: 0.6 mg/dL (ref <1.2)
Total Protein: 7.1 g/dL (ref 6.5–8.1)

## 2023-09-24 LAB — CBC WITH DIFFERENTIAL (CANCER CENTER ONLY)
Abs Immature Granulocytes: 0.02 10*3/uL (ref 0.00–0.07)
Basophils Absolute: 0 10*3/uL (ref 0.0–0.1)
Basophils Relative: 0 %
Eosinophils Absolute: 0.2 10*3/uL (ref 0.0–0.5)
Eosinophils Relative: 3 %
HCT: 42.5 % (ref 36.0–46.0)
Hemoglobin: 13.2 g/dL (ref 12.0–15.0)
Immature Granulocytes: 0 %
Lymphocytes Relative: 18 %
Lymphs Abs: 0.8 10*3/uL (ref 0.7–4.0)
MCH: 28 pg (ref 26.0–34.0)
MCHC: 31.1 g/dL (ref 30.0–36.0)
MCV: 90.2 fL (ref 80.0–100.0)
Monocytes Absolute: 0.3 10*3/uL (ref 0.1–1.0)
Monocytes Relative: 7 %
Neutro Abs: 3.3 10*3/uL (ref 1.7–7.7)
Neutrophils Relative %: 72 %
Platelet Count: 243 10*3/uL (ref 150–400)
RBC: 4.71 MIL/uL (ref 3.87–5.11)
RDW: 15.1 % (ref 11.5–15.5)
WBC Count: 4.6 10*3/uL (ref 4.0–10.5)
nRBC: 0 % (ref 0.0–0.2)

## 2023-09-24 MED ORDER — FAMOTIDINE IN NACL 20-0.9 MG/50ML-% IV SOLN
20.0000 mg | Freq: Once | INTRAVENOUS | Status: AC
  Administered 2023-09-24: 20 mg via INTRAVENOUS
  Filled 2023-09-24: qty 50

## 2023-09-24 MED ORDER — SODIUM CHLORIDE 0.9 % IV SOLN
240.0000 mg | Freq: Once | INTRAVENOUS | Status: AC
  Administered 2023-09-24: 240 mg via INTRAVENOUS
  Filled 2023-09-24: qty 24

## 2023-09-24 MED ORDER — DIPHENHYDRAMINE HCL 50 MG/ML IJ SOLN
12.5000 mg | Freq: Once | INTRAMUSCULAR | Status: AC
  Administered 2023-09-24: 12.5 mg via INTRAVENOUS
  Filled 2023-09-24: qty 1

## 2023-09-24 MED ORDER — SODIUM CHLORIDE 0.9 % IV SOLN: INTRAVENOUS | Status: DC

## 2023-09-24 MED ORDER — SODIUM CHLORIDE 0.9 % IV SOLN
1.0000 mg/kg | Freq: Once | INTRAVENOUS | Status: AC
  Administered 2023-09-24: 85 mg via INTRAVENOUS
  Filled 2023-09-24: qty 17

## 2023-09-24 NOTE — Progress Notes (Addendum)
Patient seen by Lonna Cobb NP today  Vitals are within treatment parameters:Yes   Labs are within treatment parameters: Yes   Treatment plan has been signed: Yes   Per physician team, Patient is ready for treatment. Please note the following modifications: pre-medication has change Benadryl 12.5mg 

## 2023-09-24 NOTE — Progress Notes (Signed)
Ipilimumab (YERVOY) Patient Monitoring Assessment   Is the patient experiencing any of the following general symptoms?:  [ ] Difficulty performing normal activities [ ] Feeling sluggish or cold all the time [ ] Unusual weight gain [ ] Constant or unusual headaches [ ] Feeling dizzy or faint [ ] Changes in eyesight (blurry vision, double vision, or other vision problems) [ ] Changes in mood or behavior (ex: decreased sex drive, irritability, or forgetfulness) [ ] Starting new medications (ex: steroids, other medications that lower immune response) [ x]Patient is not experiencing any of the general symptoms above.   Gastrointestinal  Patient is having 2-3 bowel movements each day.  Is this different from baseline? [ ] Yes [ x]No Are your stools watery or do they have a foul smell? [ ] Yes [ x]No Have you seen blood in your stools? [ ] Yes [ x]No Are your stools dark, tarry, or sticky? [ ] Yes [x ]No Are you having pain or tenderness in your belly? [ ] Yes [ x]No  Skin Does your skin itch? [ ] Yes [x ]No Do you have a rash? [ ] Yes [ x]No Has your skin blistered and/or peeled? [ ] Yes [ x]No Do you have sores in your mouth? [ ] Yes [ x]No  Hepatic Has your urine been dark or tea colored? [ ] Yes [x ]No Have you noticed that your skin or the whites of your eyes are turning yellow? [ ] Yes [ x]No Are you bleeding or bruising more easily than normal? [ ] Yes [ x]No Are you nauseous and/or vomiting? [ ] Yes x[ ] No Do you have pain on the right side of your stomach? [ ] Yes [x ]No  Neurologic  Are you having unusual weakness of legs, arms, or face? [ ] Yes [ x]No Are you having numbness or tingling in your hands or feet? [ ] Yes [x ]No  Destiny [name]

## 2023-09-24 NOTE — Progress Notes (Signed)
[facility] OFFICE PROGRESS NOTE   Diagnosis: Melanoma  INTERVAL HISTORY:   Ms. Thurgood returns as scheduled.  She completed cycle 1 ipilimumab/nivolumab 09/03/2023.  No rash.  No change in baseline bowel habits which consists of intermittent diarrhea.  No mouth sores.  She developed restless legs following the Benadryl premedication and requests a decrease in the dose.  She has intermittent pain involving the mass at the left groin.  She takes ibuprofen as needed.  Objective:  Vital signs in last 24 hours:  Blood pressure 121/71, pulse 62, temperature 98.2 F (36.8 C), temperature source Temporal, resp. rate 18, height 5\' 4"  (1.626 m), weight 186 lb 12.8 oz (84.7 kg), SpO2 98%.    HEENT: No thrush or ulcers. Lymphatics: Firm large eft inguinal mass. Resp: Lungs clear bilaterally. Cardio: Regular rate and rhythm. GI: No hepatosplenomegaly. Vascular: No leg edema. Skin: No rash.   Lab Results:  Lab Results  Component Value Date   WBC 4.6 09/24/2023   HGB 13.2 09/24/2023   HCT 42.5 09/24/2023   MCV 90.2 09/24/2023   PLT 243 09/24/2023   NEUTROABS 3.3 09/24/2023    Imaging:  No results found.  Medications: I have reviewed the patient's current medications.  Assessment/Plan: Rectal cancer Colonoscopy 03/06/2022-nonobstructing mass at 12 cm from the anal verge-biopsy invasive moderately differentiated adenocarcinoma arising within a tubular adenoma with high-grade dysplasia CTs 03/13/2022-no rectal mass seen, no evidence of metastatic disease, small 2-4 mm pulmonary nodules-at least 2 are calcified, likely benign granulomas, nonobstructing stone in the interpolar right renal collecting system MRI pelvis 03/30/2022-tumor at 11-13 cm from the anal verge, T3b, early EMVI?,  No adenopathy Neoadjuvant radiation/capecitabine 04/21/2022-05/28/2022 Xeloda discontinued 05/15/2022 due to progressive breakdown/ulceration at the left heel. Low anterior resection  08/13/2022-negative for residual cancer,[id number], 0/14 nodes margins negative, no distinct mass, 2.1 x 1.5 cm minimally ulcerated firm area of mucosa   2.   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 (reexcision 04/24/2021 and 05/22/2021, final margins clear,pT3b,pN0, ulceration present, satellitosis absent, lymphovascular invasion absent, neurotropism present, tumor infiltrating lymphocytes-nonbrisk, tumor regression absent Nonhealing left heel ulceration beginning August 2023, followed at the wound clinic Punch biopsy of left heel wound 05/11/2023-malignant melanoma in the dermis extending to the deep margins with no clear connection to the epidermis, consistent with local recurrence of melanoma, BRAF V600 negative FNA biopsy of left inguinal lymph node 05/21/2023-atypical cells present, SOX-10 negative PET 819 2024-moderately FDG avid0.7 cm left popliteal node, and 5.1 x 5.2 cm left groin mass, avidity diffusely involving the left heel melanoma resection site Cycle 1 Pembrolizumab 06/12/2023 Cycle 2 Pembrolizumab 07/03/2023 Cycle 3 pembrolizumab 07/23/2023 Cycle 4 Pembrolizumab 08/13/2023 CTs 08/19/2023-increased size of previously hypermetabolic left inguinal mass, no evidence of new metastatic disease, stable tiny pulmonary nodules-favor benign 09/03/2023 cycle 1 ipilimumab/nivolumab 09/24/2023 cycle 2 ipilimumab/nivolumab   3.  Diabetes   4.  Laparoscopic gastric band surgery   5.  Left heel ulceration-progressive 05/15/2022.  Xeloda discontinued.  Followed at the wound clinic.  Disposition: Ms. Rundlett appears stable.  She has completed 1 cycle of ipilimumab/nivolumab.  She tolerated well.  Plan to proceed with cycle 2 today as scheduled.  She developed restless legs likely due to Benadryl premedication.  We will decrease the Benadryl dose from 25 mg to 12.5 mg.  Plan for restaging CTs after she has completed 3 cycles.  CBC and chemistry panel reviewed.  Labs  adequate to proceed as above.  She will return for follow-up and  treatment in 3 weeks.  We are available to see her sooner if needed.    Lonna Cobb ANP/GNP-BC   09/24/2023  8:44 AM

## 2023-09-24 NOTE — Patient Instructions (Signed)
 [facility] - A DEPT OF [facility]   Discharge Instructions: Thank you for choosing [facility] to provide your oncology and hematology care.   If you have a lab appointment with the Cancer Center, please go directly to the Cancer Center and check in at the registration area.   Wear comfortable clothing and clothing appropriate for easy access to any Portacath or PICC line.   We strive to give you quality time with your provider. You may need to reschedule your appointment if you arrive late (15 or more minutes).  Arriving late affects you and other patients whose appointments are after yours.  Also, if you miss three or more appointments without notifying the office, you may be dismissed from the clinic at the provider's discretion.      For prescription refill requests, have your pharmacy contact our office and allow 72 hours for refills to be completed.    Today you received the following chemotherapy and/or immunotherapy agents Opdivo/Yervoy      To help prevent nausea and vomiting after your treatment, we encourage you to take your nausea medication as directed.  BELOW ARE SYMPTOMS THAT SHOULD BE REPORTED IMMEDIATELY: *FEVER GREATER THAN 100.4 F (38 C) OR HIGHER *CHILLS OR SWEATING *NAUSEA AND VOMITING THAT IS NOT CONTROLLED WITH YOUR NAUSEA MEDICATION *UNUSUAL SHORTNESS OF BREATH *UNUSUAL BRUISING OR BLEEDING *URINARY PROBLEMS (pain or burning when urinating, or frequent urination) *BOWEL PROBLEMS (unusual diarrhea, constipation, pain near the anus) TENDERNESS IN MOUTH AND THROAT WITH OR WITHOUT PRESENCE OF ULCERS (sore throat, sores in mouth, or a toothache) UNUSUAL RASH, SWELLING OR PAIN  UNUSUAL VAGINAL DISCHARGE OR ITCHING   Items with * indicate a potential emergency and should be followed up as soon as possible or go to the Emergency Department if any problems should occur.  Please show the CHEMOTHERAPY ALERT CARD or  IMMUNOTHERAPY ALERT CARD at check-in to the Emergency Department and triage nurse.  Should you have questions after your visit or need to cancel or reschedule your appointment, please contact [facility]  Dept: [phone]  and follow the prompts.  Office hours are 8:00 a.m. to 4:30 p.m. Monday - Friday. Please note that voicemails left after 4:00 p.m. may not be returned until the following business day.  We are closed weekends and major holidays. You have access to a nurse at all times for urgent questions. Please call the main number to the clinic Dept: [phone] and follow the prompts.   For any non-urgent questions, you may also contact your provider using MyChart. We now offer e-Visits for anyone 95 and older to request care online for non-urgent symptoms. For details visit [email or web address].   Also download the MyChart app! Go to the app store, search "MyChart", open the app, select [place], and log in with your MyChart username and password.  Nivolumab Injection What is this medication? NIVOLUMAB (nye VOL ue mab) treats some types of cancer. It works by helping your immune system slow or stop the spread of cancer cells. It is a monoclonal antibody. This medicine may be used for other purposes; ask your health care provider or pharmacist if you have questions. COMMON BRAND NAME(S): Opdivo What should I tell my care team before I take this medication? They need to know if you have any of these conditions: Allogeneic stem cell transplant (uses someone else's stem cells) Autoimmune diseases, such as  Crohn disease, ulcerative colitis, lupus History of chest radiation Nervous system problems, such as Guillain-Barre syndrome or myasthenia gravis Organ transplant An unusual or allergic reaction to nivolumab, other medications, foods, dyes, or preservatives Pregnant or trying to get pregnant Breast-feeding How should I use  this medication? This medication is infused into a vein. It is given in a hospital or clinic setting. A special MedGuide will be given to you before each treatment. Be sure to read this information carefully each time. Talk to your care team about the use of this medication in children. While it may be prescribed for children as young as 12 years for selected conditions, precautions do apply. Overdosage: If you think you have taken too much of this medicine contact a poison control center or emergency room at once. NOTE: This medicine is only for you. Do not share this medicine with others. What if I miss a dose? Keep appointments for follow-up doses. It is important not to miss your dose. Call your care team if you are unable to keep an appointment. What may interact with this medication? Interactions have not been studied. This list may not describe all possible interactions. Give your health care provider a list of all the medicines, herbs, non-prescription drugs, or dietary supplements you use. Also tell them if you smoke, drink alcohol, or use illegal drugs. Some items may interact with your medicine. What should I watch for while using this medication? Your condition will be monitored carefully while you are receiving this medication. You may need blood work while taking this medication. This medication may cause serious skin reactions. They can happen weeks to months after starting the medication. Contact your care team right away if you notice fevers or flu-like symptoms with a rash. The rash may be red or purple and then turn into blisters or peeling of the skin. You may also notice a red rash with swelling of the face, lips, or lymph nodes in your neck or under your arms. Tell your care team right away if you have any change in your eyesight. Talk to your care team if you are pregnant or think you might be pregnant. A negative pregnancy test is required before starting this medication. A  reliable form of contraception is recommended while taking this medication and for 5 months after the last dose. Talk to your care team about effective forms of contraception. Do not breast-feed while taking this medication and for 5 months after the last dose. What side effects may I notice from receiving this medication? Side effects that you should report to your care team as soon as possible: Allergic reactions--skin rash, itching, hives, swelling of the face, lips, tongue, or throat Dry cough, shortness of breath or trouble breathing Eye pain, redness, irritation, or discharge with blurry or decreased vision Heart muscle inflammation--unusual weakness or fatigue, shortness of breath, chest pain, fast or irregular heartbeat, dizziness, swelling of the ankles, feet, or hands Hormone gland problems--headache, sensitivity to light, unusual weakness or fatigue, dizziness, fast or irregular heartbeat, increased sensitivity to cold or heat, excessive sweating, constipation, hair loss, increased thirst or amount of urine, tremors or shaking, irritability Infusion reactions--chest pain, shortness of breath or trouble breathing, feeling faint or lightheaded Kidney injury (glomerulonephritis)--decrease in the amount of urine, red or dark brown urine, foamy or bubbly urine, swelling of the ankles, hands, or feet Liver injury--right upper belly pain, loss of appetite, nausea, light-colored stool, dark yellow or brown urine, yellowing skin or eyes, unusual weakness  or fatigue Pain, tingling, or numbness in the hands or feet, muscle weakness, change in vision, confusion or trouble speaking, loss of balance or coordination, trouble walking, seizures Rash, fever, and swollen lymph nodes Redness, blistering, peeling, or loosening of the skin, including inside the mouth Sudden or severe stomach pain, bloody diarrhea, fever, nausea, vomiting Side effects that usually do not require medical attention (report these  to your care team if they continue or are bothersome): Bone, joint, or muscle pain Diarrhea Fatigue Loss of appetite Nausea Skin rash This list may not describe all possible side effects. Call your doctor for medical advice about side effects. You may report side effects to FDA at 1-800-FDA-1088. Where should I keep my medication? This medication is given in a hospital or clinic. It will not be stored at home. NOTE: This sheet is a summary. It may not cover all possible information. If you have questions about this medicine, talk to your doctor, pharmacist, or health care provider.  2024 Elsevier/Gold Standard (2022-01-27 00:00:00) Ipilimumab Injection What is this medication? IPILIMUMAB (IP i LIM ue mab) treats some types of cancer. It works by helping your immune system slow or stop the spread of cancer cells. It is a monoclonal antibody. This medicine may be used for other purposes; ask your health care provider or pharmacist if you have questions. COMMON BRAND NAME(S): YERVOY What should I tell my care team before I take this medication? They need to know if you have any of these conditions: Allogeneic stem cell transplant (uses someone else's stem cells) Autoimmune diseases, such as Crohn disease, ulcerative colitis, lupus Nervous system problems, such as Guillain-Barre syndrome or myasthenia gravis Organ transplant An unusual or allergic reaction to ipilimumab, other medications, foods, dyes, or preservatives Pregnant or trying to get pregnant Breast-feeding How should I use this medication? This medication is infused into a vein. It is given by your care team in a hospital or clinic setting. A special MedGuide will be given to you before each treatment. Be sure to read this information carefully each time. Talk to your care team about the use of this medication in children. While it may be prescribed for children as young as 12 years for selected conditions, precautions do  apply. Overdosage: If you think you have taken too much of this medicine contact a poison control center or emergency room at once. NOTE: This medicine is only for you. Do not share this medicine with others. What if I miss a dose? Keep appointments for follow-up doses. It is important not to miss your dose. Call your care team if you are unable to keep an appointment. What may interact with this medication? Interactions are not expected. This list may not describe all possible interactions. Give your health care provider a list of all the medicines, herbs, non-prescription drugs, or dietary supplements you use. Also tell them if you smoke, drink alcohol, or use illegal drugs. Some items may interact with your medicine. What should I watch for while using this medication? Your condition will be monitored carefully while you are receiving this medication. You may need blood work while taking this medication. This medication may cause serious skin reactions. They can happen weeks to months after starting the medication. Contact your care team right away if you notice fevers or flu-like symptoms with a rash. The rash may be red or purple and then turn into blisters or peeling of the skin. You may also notice a red rash with swelling of the  face, lips, or lymph nodes in your neck or under your arms. Tell your care team right away if you have any change in your eyesight. Talk to your care team if you may be pregnant. Serious birth defects can occur if you take this medication during pregnancy and for 3 months after the last dose. You will need a negative pregnancy test before starting this medication. Contraception is recommended while taking this medication and for 3 months after the last dose. Your care team can help you find the option that works for you. Do not breastfeed while taking this medication and for 3 months after the last dose. What side effects may I notice from receiving this  medication? Side effects that you should report to your care team as soon as possible: Allergic reactions--skin rash, itching, hives, swelling of the face, lips, tongue, or throat Dry cough, shortness of breath or trouble breathing Eye pain, redness, irritation, or discharge with blurry or decreased vision Heart muscle inflammation--unusual weakness or fatigue, shortness of breath, chest pain, fast or irregular heartbeat, dizziness, swelling of the ankles, feet, or hands Hormone gland problems--headache, sensitivity to light, unusual weakness or fatigue, dizziness, fast or irregular heartbeat, increased sensitivity to cold or heat, excessive sweating, constipation, hair loss, increased thirst or amount of urine, tremors or shaking, irritability Infusion reactions--chest pain, shortness of breath or trouble breathing, feeling faint or lightheaded Kidney injury (glomerulonephritis)--decrease in the amount of urine, red or dark brown urine, foamy or bubbly urine, swelling of the ankles, hands, or feet Liver injury--right upper belly pain, loss of appetite, nausea, light-colored stool, dark yellow or brown urine, yellowing skin or eyes, unusual weakness or fatigue Pain, tingling, or numbness in the hands or feet, muscle weakness, change in vision, confusion or trouble speaking, loss of balance or coordination, trouble walking, seizures Rash, fever, and swollen lymph nodes Redness, blistering, peeling, or loosening of the skin, including inside the mouth Sudden or severe stomach pain, bloody diarrhea, fever, nausea, vomiting Side effects that usually do not require medical attention (report to your care team if they continue or are bothersome): Bone, joint, or muscle pain Diarrhea Fatigue Loss of appetite Nausea Skin rash This list may not describe all possible side effects. Call your doctor for medical advice about side effects. You may report side effects to FDA at 1-800-FDA-1088. Where should I  keep my medication? This medication is given in a hospital or clinic. It will not be stored at home. NOTE: This sheet is a summary. It may not cover all possible information. If you have questions about this medicine, talk to your doctor, pharmacist, or health care provider.  2024 Elsevier/Gold Standard (2022-02-14 00:00:00)

## 2023-09-26 ENCOUNTER — Other Ambulatory Visit: Payer: Self-pay

## 2023-10-05 ENCOUNTER — Encounter: Payer: Self-pay | Admitting: Internal Medicine

## 2023-10-11 ENCOUNTER — Other Ambulatory Visit: Payer: Self-pay | Admitting: Oncology

## 2023-10-15 ENCOUNTER — Inpatient Hospital Stay: Payer: PPO | Attending: Oncology

## 2023-10-15 ENCOUNTER — Encounter: Payer: Self-pay | Admitting: Nurse Practitioner

## 2023-10-15 ENCOUNTER — Inpatient Hospital Stay: Payer: PPO | Admitting: Nurse Practitioner

## 2023-10-15 ENCOUNTER — Encounter (INDEPENDENT_AMBULATORY_CARE_PROVIDER_SITE_OTHER): Payer: Self-pay

## 2023-10-15 ENCOUNTER — Inpatient Hospital Stay: Payer: PPO

## 2023-10-15 VITALS — BP 128/75 | HR 57 | Resp 18

## 2023-10-15 VITALS — BP 139/81 | HR 78 | Temp 98.2°F | Resp 18 | Ht 64.0 in | Wt 192.2 lb

## 2023-10-15 DIAGNOSIS — R197 Diarrhea, unspecified: Secondary | ICD-10-CM | POA: Diagnosis not present

## 2023-10-15 DIAGNOSIS — C4372 Malignant melanoma of left lower limb, including hip: Secondary | ICD-10-CM | POA: Insufficient documentation

## 2023-10-15 DIAGNOSIS — Z85048 Personal history of other malignant neoplasm of rectum, rectosigmoid junction, and anus: Secondary | ICD-10-CM | POA: Diagnosis not present

## 2023-10-15 DIAGNOSIS — E119 Type 2 diabetes mellitus without complications: Secondary | ICD-10-CM | POA: Diagnosis not present

## 2023-10-15 DIAGNOSIS — C439 Malignant melanoma of skin, unspecified: Secondary | ICD-10-CM

## 2023-10-15 DIAGNOSIS — R918 Other nonspecific abnormal finding of lung field: Secondary | ICD-10-CM | POA: Diagnosis not present

## 2023-10-15 DIAGNOSIS — Z5112 Encounter for antineoplastic immunotherapy: Secondary | ICD-10-CM | POA: Insufficient documentation

## 2023-10-15 DIAGNOSIS — R1909 Other intra-abdominal and pelvic swelling, mass and lump: Secondary | ICD-10-CM | POA: Diagnosis not present

## 2023-10-15 DIAGNOSIS — Z923 Personal history of irradiation: Secondary | ICD-10-CM | POA: Insufficient documentation

## 2023-10-15 LAB — CMP (CANCER CENTER ONLY)
ALT: 11 U/L (ref 0–44)
AST: 13 U/L — ABNORMAL LOW (ref 15–41)
Albumin: 4.5 g/dL (ref 3.5–5.0)
Alkaline Phosphatase: 58 U/L (ref 38–126)
Anion gap: 9 (ref 5–15)
BUN: 18 mg/dL (ref 8–23)
CO2: 26 mmol/L (ref 22–32)
Calcium: 8.8 mg/dL — ABNORMAL LOW (ref 8.9–10.3)
Chloride: 106 mmol/L (ref 98–111)
Creatinine: 0.91 mg/dL (ref 0.44–1.00)
GFR, Estimated: >60 mL/min (ref >60)
Glucose, Bld: 79 mg/dL (ref 70–99)
Potassium: 3.8 mmol/L (ref 3.5–5.1)
Sodium: 141 mmol/L (ref 135–145)
Total Bilirubin: 0.5 mg/dL (ref 0.0–1.2)
Total Protein: 7.2 g/dL (ref 6.5–8.1)

## 2023-10-15 LAB — CBC WITH DIFFERENTIAL (CANCER CENTER ONLY)
Abs Immature Granulocytes: 0.02 10*3/uL (ref 0.00–0.07)
Basophils Absolute: 0 10*3/uL (ref 0.0–0.1)
Basophils Relative: 0 %
Eosinophils Absolute: 0.1 10*3/uL (ref 0.0–0.5)
Eosinophils Relative: 3 %
HCT: 41.1 % (ref 36.0–46.0)
Hemoglobin: 12.7 g/dL (ref 12.0–15.0)
Immature Granulocytes: 0 %
Lymphocytes Relative: 18 %
Lymphs Abs: 0.9 10*3/uL (ref 0.7–4.0)
MCH: 28.5 pg (ref 26.0–34.0)
MCHC: 30.9 g/dL (ref 30.0–36.0)
MCV: 92.2 fL (ref 80.0–100.0)
Monocytes Absolute: 0.3 10*3/uL (ref 0.1–1.0)
Monocytes Relative: 7 %
Neutro Abs: 3.5 10*3/uL (ref 1.7–7.7)
Neutrophils Relative %: 72 %
Platelet Count: 238 10*3/uL (ref 150–400)
RBC: 4.46 MIL/uL (ref 3.87–5.11)
RDW: 15.8 % — ABNORMAL HIGH (ref 11.5–15.5)
WBC Count: 4.9 10*3/uL (ref 4.0–10.5)
nRBC: 0 % (ref 0.0–0.2)

## 2023-10-15 LAB — TSH: TSH: 8.761 u[IU]/mL — ABNORMAL HIGH (ref 0.350–4.500)

## 2023-10-15 MED ORDER — FAMOTIDINE IN NACL 20-0.9 MG/50ML-% IV SOLN
20.0000 mg | Freq: Once | INTRAVENOUS | Status: AC
  Administered 2023-10-15: 20 mg via INTRAVENOUS
  Filled 2023-10-15: qty 50

## 2023-10-15 MED ORDER — SODIUM CHLORIDE 0.9 % IV SOLN: INTRAVENOUS | Status: DC

## 2023-10-15 MED ORDER — DIPHENHYDRAMINE HCL 50 MG/ML IJ SOLN
12.5000 mg | Freq: Once | INTRAMUSCULAR | Status: AC
  Administered 2023-10-15: 12.5 mg via INTRAVENOUS
  Filled 2023-10-15: qty 1

## 2023-10-15 MED ORDER — NIVOLUMAB CHEMO INJECTION 100 MG/10ML
240.0000 mg | Freq: Once | INTRAVENOUS | Status: AC
  Administered 2023-10-15: 240 mg via INTRAVENOUS
  Filled 2023-10-15: qty 24

## 2023-10-15 MED ORDER — SODIUM CHLORIDE 0.9 % IV SOLN
1.0000 mg/kg | Freq: Once | INTRAVENOUS | Status: AC
  Administered 2023-10-15: 85 mg via INTRAVENOUS
  Filled 2023-10-15: qty 17

## 2023-10-15 MED ORDER — TRAMADOL HCL 50 MG PO TABS: 50.0000 mg | ORAL_TABLET | Freq: Two times a day (BID) | ORAL | 0 refills | Status: AC | PRN

## 2023-10-15 NOTE — Progress Notes (Signed)
 Patient seen by Olam Ned NP today  Vitals are within treatment parameters:Yes   Labs are within treatment parameters: Yes   Treatment plan has been signed: Yes   Per physician team, Patient is ready for treatment and there are NO modifications to the treatment plan.

## 2023-10-15 NOTE — Progress Notes (Signed)
 [facility] OFFICE PROGRESS NOTE   Diagnosis:  Melanoma  INTERVAL HISTORY:   Destiny [name] returns as scheduled.  Destiny [name] completed cycle 2 ipilimumab /nivolumab  09/24/2023.  No rash.  Destiny [name] has periodic loose stools.  Destiny [name] notes increased discomfort in the right groin at nighttime.  This is affecting her sleep.  Ibuprofen does not help.  Destiny [name] thinks the left groin mass is larger.  Destiny [name] has noticed swelling at the left lower leg.  Objective:  Vital signs in last 24 hours:  Blood pressure 139/81, pulse 78, temperature 98.2 F (36.8 C), temperature source Temporal, resp. rate 18, height 5' 4 (1.626 m), weight 192 lb 3.2 oz (87.2 kg), SpO2 98%.    HEENT: No thrush or ulcers. Lymphatics: Large left groin mass, measures approximately 10 cm. Resp: Lungs clear bilaterally. Cardio: Regular rate and rhythm. GI: Abdomen soft, nontender.  No hepatosplenomegaly. Vascular: Trace edema left leg below the knee. Skin: No rash.  Ulceration with scabbing at the left heel.   Lab Results:  Lab Results  Component Value Date   WBC 4.9 10/15/2023   HGB 12.7 10/15/2023   HCT 41.1 10/15/2023   MCV 92.2 10/15/2023   PLT 238 10/15/2023   NEUTROABS 3.5 10/15/2023    Imaging:  No results found.  Medications: I have reviewed the patient's current medications.  Assessment/Plan: Rectal cancer Colonoscopy 03/06/2022-nonobstructing mass at 12 cm from the anal verge-biopsy invasive moderately differentiated adenocarcinoma arising within a tubular adenoma with high-grade dysplasia CTs 03/13/2022-no rectal mass seen, no evidence of metastatic disease, small 2-4 mm pulmonary nodules-at least 2 are calcified, likely benign granulomas, nonobstructing stone in the interpolar right renal collecting system MRI pelvis 03/30/2022-tumor at 11-13 cm from the anal verge, T3b, early EMVI?,  No adenopathy Neoadjuvant radiation/capecitabine  04/21/2022-05/28/2022 Xeloda  discontinued 05/15/2022 due to progressive  breakdown/ulceration at the left heel. Low anterior resection 08/13/2022-negative for residual cancer,ypT0ypN0, 0/14 nodes margins negative, no distinct mass, 2.1 x 1.5 cm minimally ulcerated firm area of mucosa   2.   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 (reexcision 04/24/2021 and 05/22/2021, final margins clear,pT3b,pN0, ulceration present, satellitosis absent, lymphovascular invasion absent, neurotropism present, tumor infiltrating lymphocytes-nonbrisk, tumor regression absent Nonhealing left heel ulceration beginning August 2023, followed at the wound clinic Punch biopsy of left heel wound 05/11/2023-malignant melanoma in the dermis extending to the deep margins with no clear connection to the epidermis, consistent with local recurrence of melanoma, BRAF V600 negative FNA biopsy of left inguinal lymph node 05/21/2023-atypical cells present, SOX-10 negative PET 819 2024-moderately FDG avid0.7 cm left popliteal node, and 5.1 x 5.2 cm left groin mass, avidity diffusely involving the left heel melanoma resection site Cycle 1 Pembrolizumab  06/12/2023 Cycle 2 Pembrolizumab  07/03/2023 Cycle 3 pembrolizumab  07/23/2023 Cycle 4 Pembrolizumab  08/13/2023 CTs 08/19/2023-increased size of previously hypermetabolic left inguinal mass, no evidence of new metastatic disease, stable tiny pulmonary nodules-favor benign 09/03/2023 cycle 1 ipilimumab /nivolumab  09/24/2023 cycle 2 ipilimumab /nivolumab  10/15/2023 cycle 3 ipilimumab /nivolumab    3.  Diabetes   4.  Laparoscopic gastric band surgery   5.  Left heel ulceration-progressive 05/15/2022.  Xeloda  discontinued.  Followed at the wound clinic.    Disposition: Destiny [name] has completed 2 cycles of ipilimumab /nivolumab .  Destiny [name] appears to be tolerating treatment well.  The left heel appears improved.  The left groin mass may be larger.  We decided to proceed with cycle 3 today as scheduled.  Restaging CTs prior to next office visit.  CBC  and chemistry panel reviewed.  Labs  adequate to proceed as above.  Destiny [name] is having increased left groin pain at nighttime.  We are sending a prescription for tramadol  to her pharmacy.  We discussed sedation and constipation associated with pain medications.  Destiny [name] understands Destiny [name] should not drive while taking pain medication.  Destiny [name] will return for follow-up in 3 weeks.  We are available to see her sooner if needed.  Patient seen with Dr. Cloretta.   Destiny [name] ANP/GNP-BC   10/15/2023  11:11 AM This was a shared visit with Destiny [name].  Destiny [name] was interviewed and examined.  Destiny [name] has completed 2 treatments with ipilimumab /nivolumab .  Destiny [name] has tolerated treatment well.  The heel lesion appears improved, but the left groin mass is larger.  Destiny [name] will complete cycle 3 today.  Destiny [name] will undergo a restaging pelvic CT after this cycle.  We will refer her to Dr. Jud if the CT reveals progressive disease.  Destiny [name] may be a candidate for a clinical trial intralesional therapy, or salvage chemotherapy.  I was present for greater than 50% of today's visit.  I performed medical decision making.  Arvella Cloretta, MD

## 2023-10-15 NOTE — Progress Notes (Signed)
 Ipilimumab  (YERVOY ) Patient Monitoring Assessment   Is the patient experiencing any of the following general symptoms?:  [ ] Difficulty performing normal activities [ ] Feeling sluggish or cold all the time [ ] Unusual weight gain [ ] Constant or unusual headaches [ ] Feeling dizzy or faint [ ] Changes in eyesight (blurry vision, double vision, or other vision problems) [ ] Changes in mood or behavior (ex: decreased sex drive, irritability, or forgetfulness) [ ] Starting new medications (ex: steroids, other medications that lower immune response) [ x]Patient is not experiencing any of the general symptoms above.   Gastrointestinal  Patient is having 2 bowel movements each day.  Is this different from baseline? [ ] Yes [x ]No Are your stools watery or do they have a foul smell? [ ] Yes [ x]No Have you seen blood in your stools? [ ] Yes [ x]No Are your stools dark, tarry, or sticky? [ ] Yes [ x]No Are you having pain or tenderness in your belly? [ ] Yes [ x]No  Skin Does your skin itch? [ ] Yes [ x]No Do you have a rash? [ ] Yes [ x]No Has your skin blistered and/or peeled? [ ] Yes [ x]No Do you have sores in your mouth? [ ] Yes [x ]No  Hepatic Has your urine been dark or tea colored? [ ] Yes [ x]No Have you noticed that your skin or the whites of your eyes are turning yellow? [ ] Yes [ x]No Are you bleeding or bruising more easily than normal? [ ] Yes [x ]No Are you nauseous and/or vomiting? [ ] Yes [ x]No Do you have pain on the right side of your stomach? [ ] Yes [ x]No  Neurologic  Are you having unusual weakness of legs, arms, or face? [ ] Yes [ x]No Are you having numbness or tingling in your hands or feet? [ ] Yes [ x]No  Geofm LITTIE Salt

## 2023-10-16 ENCOUNTER — Telehealth: Payer: Self-pay

## 2023-10-16 ENCOUNTER — Other Ambulatory Visit: Payer: Self-pay | Admitting: Nurse Practitioner

## 2023-10-16 ENCOUNTER — Other Ambulatory Visit: Payer: Self-pay

## 2023-10-16 DIAGNOSIS — C439 Malignant melanoma of skin, unspecified: Secondary | ICD-10-CM

## 2023-10-16 LAB — T4: T4, Total: 7.3 ug/dL (ref 4.5–12.0)

## 2023-10-16 NOTE — Telephone Encounter (Signed)
 Patient gave verbal understanding and had no further questions or concerns

## 2023-10-16 NOTE — Telephone Encounter (Signed)
-----   Message from Lonna Cobb sent at 10/16/2023  2:04 PM EST ----- Please let her know Dr. Truett Perna recommends a referral to Dr. Roland Rack at [facility] for recommendations on treatment options.

## 2023-10-19 ENCOUNTER — Encounter: Payer: Self-pay | Admitting: *Deleted

## 2023-10-19 NOTE — Progress Notes (Signed)
 Faxed referral order, demographics and medical records to Dr. Dan Europe at [facility] # [phone]. Notified radiology to push images over to [facility] system.

## 2023-10-20 DIAGNOSIS — L97412 Non-pressure chronic ulcer of right heel and midfoot with fat layer exposed: Secondary | ICD-10-CM | POA: Diagnosis not present

## 2023-10-30 ENCOUNTER — Telehealth: Payer: Self-pay

## 2023-10-30 NOTE — Telephone Encounter (Signed)
Patient called and request a cream for her groin area. The pain medication is not working. Patient keeping her up at night.

## 2023-11-01 ENCOUNTER — Other Ambulatory Visit: Payer: Self-pay | Admitting: Oncology

## 2023-11-03 ENCOUNTER — Ambulatory Visit (HOSPITAL_BASED_OUTPATIENT_CLINIC_OR_DEPARTMENT_OTHER)
Admission: RE | Admit: 2023-11-03 | Discharge: 2023-11-03 | Disposition: A | Discharge status: Home / Self Care | Payer: PPO | Source: Ambulatory Visit | Attending: Nurse Practitioner | Admitting: Nurse Practitioner

## 2023-11-03 DIAGNOSIS — I700 Atherosclerosis of aorta: Secondary | ICD-10-CM | POA: Diagnosis not present

## 2023-11-03 DIAGNOSIS — C439 Malignant melanoma of skin, unspecified: Secondary | ICD-10-CM | POA: Insufficient documentation

## 2023-11-03 MED ORDER — IOHEXOL 300 MG/ML  SOLN
100.0000 mL | Freq: Once | INTRAMUSCULAR | Status: AC | PRN
  Administered 2023-11-03: 100 mL via INTRAVENOUS

## 2023-11-04 ENCOUNTER — Encounter: Payer: Self-pay | Admitting: *Deleted

## 2023-11-04 NOTE — Progress Notes (Signed)
Notified Vanda in radiology to push over today's CT C/A/P for [facility] to review.

## 2023-11-05 ENCOUNTER — Inpatient Hospital Stay: Payer: PPO

## 2023-11-05 ENCOUNTER — Inpatient Hospital Stay: Payer: PPO | Admitting: Oncology

## 2023-11-05 VITALS — BP 132/70 | HR 79 | Temp 98.2°F | Resp 18 | Ht 64.0 in | Wt 188.8 lb

## 2023-11-05 DIAGNOSIS — C439 Malignant melanoma of skin, unspecified: Secondary | ICD-10-CM | POA: Diagnosis not present

## 2023-11-05 DIAGNOSIS — Z5112 Encounter for antineoplastic immunotherapy: Secondary | ICD-10-CM | POA: Diagnosis not present

## 2023-11-05 LAB — CBC WITH DIFFERENTIAL (CANCER CENTER ONLY)
Abs Immature Granulocytes: 0.02 10*3/uL (ref 0.00–0.07)
Basophils Absolute: 0 10*3/uL (ref 0.0–0.1)
Basophils Relative: 0 %
Eosinophils Absolute: 0.1 10*3/uL (ref 0.0–0.5)
Eosinophils Relative: 3 %
HCT: 42.2 % (ref 36.0–46.0)
Hemoglobin: 13.2 g/dL (ref 12.0–15.0)
Immature Granulocytes: 0 %
Lymphocytes Relative: 22 %
Lymphs Abs: 1 10*3/uL (ref 0.7–4.0)
MCH: 28.8 pg (ref 26.0–34.0)
MCHC: 31.3 g/dL (ref 30.0–36.0)
MCV: 91.9 fL (ref 80.0–100.0)
Monocytes Absolute: 0.4 10*3/uL (ref 0.1–1.0)
Monocytes Relative: 8 %
Neutro Abs: 3 10*3/uL (ref 1.7–7.7)
Neutrophils Relative %: 67 %
Platelet Count: 266 10*3/uL (ref 150–400)
RBC: 4.59 MIL/uL (ref 3.87–5.11)
RDW: 15.3 % (ref 11.5–15.5)
WBC Count: 4.5 10*3/uL (ref 4.0–10.5)
nRBC: 0 % (ref 0.0–0.2)

## 2023-11-05 LAB — CMP (CANCER CENTER ONLY)
ALT: 9 U/L (ref 0–44)
AST: 14 U/L — ABNORMAL LOW (ref 15–41)
Albumin: 4.5 g/dL (ref 3.5–5.0)
Alkaline Phosphatase: 64 U/L (ref 38–126)
Anion gap: 11 (ref 5–15)
BUN: 12 mg/dL (ref 8–23)
CO2: 30 mmol/L (ref 22–32)
Calcium: 9.7 mg/dL (ref 8.9–10.3)
Chloride: 103 mmol/L (ref 98–111)
Creatinine: 0.81 mg/dL (ref 0.44–1.00)
GFR, Estimated: >60 mL/min (ref >60)
Glucose, Bld: 109 mg/dL — ABNORMAL HIGH (ref 70–99)
Potassium: 4 mmol/L (ref 3.5–5.1)
Sodium: 144 mmol/L (ref 135–145)
Total Bilirubin: 0.5 mg/dL (ref 0.0–1.2)
Total Protein: 6.7 g/dL (ref 6.5–8.1)

## 2023-11-05 NOTE — Progress Notes (Signed)
[facility] OFFICE PROGRESS NOTE   Diagnosis: Melanoma  INTERVAL HISTORY:   Destiny [name] completed another treatment with ipilimumab/nivolumab on 10/15/2023.  No rash.  She has occasional diarrhea, but this is not a consistent symptom.  The left groin mass has enlarged and is painful.  There is a burning sensation associated with the left groin mass and nerve pain in the left upper thigh.  She uses Tylenol, ibuprofen, and tramadol.  She takes tramadol in the evening she does not feel well the following day.  She believes the left heel lesion is improving.  No new complaint.  Objective:  Vital signs in last 24 hours:  Blood pressure 132/70, pulse 79, temperature 98.2 F (36.8 C), temperature source Temporal, resp. rate 18, height 5\' 4"  (1.626 m), weight 188 lb 12.8 oz (85.6 kg), SpO2 92%.    Lymphatics: No cervical, supraclavicular, axillary, or right inguinal nodes.  Large firm mass in the left inguinal region Resp: Lungs clear bilaterally Cardio: Regular rate and rhythm GI: No hepatosplenomegal7  Skin: Callus formation at the left heel ulcer, no mass in the left popliteal fossa   Lab Results:  Lab Results  Component Value Date   WBC 4.5 11/05/2023   HGB 13.2 11/05/2023   HCT 42.2 11/05/2023   MCV 91.9 11/05/2023   PLT 266 11/05/2023   NEUTROABS 3.0 11/05/2023    CMP  Lab Results  Component Value Date   NA 144 11/05/2023   K 4.0 11/05/2023   CL 103 11/05/2023   CO2 30 11/05/2023   GLUCOSE 109 (H) 11/05/2023   BUN 12 11/05/2023   CREATININE 0.81 11/05/2023   CALCIUM 9.7 11/05/2023   PROT 6.7 11/05/2023   ALBUMIN 4.5 11/05/2023   AST 14 (L) 11/05/2023   ALT 9 11/05/2023   ALKPHOS 64 11/05/2023   BILITOT 0.5 11/05/2023   GFRNONAA >60 11/05/2023   GFRAA >60 09/24/2017    Lab Results  Component Value Date   CEA 1.61 09/03/2023    Lab Results  Component Value Date   INR 1.0 03/26/2021   LABPROT 13.5 03/26/2021    Imaging:  CT CHEST ABDOMEN  PELVIS W CONTRAST Result Date: 11/03/2023 CLINICAL DATA:  Metastatic melanoma restaging, additional history of rectal cancer status post resection * Tracking Code: BO * EXAM: CT CHEST, ABDOMEN, AND PELVIS WITH CONTRAST TECHNIQUE: Multidetector CT imaging of the chest, abdomen and pelvis was performed following the standard protocol during bolus administration of intravenous contrast. RADIATION DOSE REDUCTION: This exam was performed according to the departmental dose-optimization program which includes automated exposure control, adjustment of the mA and/or kV according to patient size and/or use of iterative reconstruction technique. CONTRAST:  OMNIPAQUE IOHEXOL 300 MG/ML SOLN additional oral enteric contrast COMPARISON:  08/19/2023 FINDINGS: CT CHEST FINDINGS Cardiovascular: Aortic atherosclerosis. Normal heart size. Left coronary artery calcifications. Unchanged small pericardial effusion. Mediastinum/Nodes: No enlarged mediastinal, hilar, or axillary lymph nodes. Small hiatal hernia with intrathoracic position of the gastric fundus. Thyroid gland, trachea, and esophagus demonstrate no significant findings. Lungs/Pleura: Unchanged small calcified subpleural nodule of the superior segment left lower lobe measuring 0.3 cm (series 4, image 38). Unchanged tiny calcified nodule of the posterior right upper lobe (series 4, image 37). Mild bibasilar scarring or atelectasis. No pleural effusion or pneumothorax. Musculoskeletal: No chest wall abnormality. No acute osseous findings. CT ABDOMEN PELVIS FINDINGS Hepatobiliary: No solid liver abnormality is seen. Hepatic steatosis. No gallstones, gallbladder wall thickening, or biliary dilatation. Pancreas: Unremarkable. No pancreatic ductal dilatation  or surrounding inflammatory changes. Spleen: Normal in size without significant abnormality. Adrenals/Urinary Tract: Adrenal glands are unremarkable. Nonobstructive calculus of the inferior pole of the right kidney. No  left-sided calculi, ureteral calculi, or hydronephrosis. Bladder is unremarkable. Stomach/Bowel: Adjustable gastric lap band. Small hiatal hernia. Descending duodenal diverticulum. Appendix appears normal. No evidence of bowel wall thickening, distention, or inflammatory changes. Status post low rectal resection and reanastomosis. Vascular/Lymphatic: Aortic atherosclerosis. Interval enlargement of a bulky, heterogeneously calcified left inguinal mass measuring 7.9 x 7.2 cm, previously 7.0 x 5.4 cm (series 2, image 107). No other enlarged lymph nodes in the abdomen or pelvis. Reproductive: Status post hysterectomy. Other: No abdominal wall hernia or abnormality. No ascites. Musculoskeletal: No acute osseous findings. IMPRESSION: 1. Interval enlargement of a bulky, heterogeneously calcified left inguinal mass measuring 7.9 x 7.2 cm, previously 7.0 x 5.4 cm. 2. No other evidence of lymphadenopathy or metastatic disease in the chest, abdomen, or pelvis. 3. Unchanged small calcified pulmonary nodules, most likely benign and granulomatous. 4. Hepatic steatosis. 5. Nonobstructive right nephrolithiasis. 6. Coronary artery disease. Aortic Atherosclerosis (ICD10-I70.0). Electronically Signed   By: Jearld Lesch M.D.   On: 11/03/2023 14:06    Medications: I have reviewed the patient's current medications.   Assessment/Plan: Rectal cancer Colonoscopy 03/06/2022-nonobstructing mass at 12 cm from the anal verge-biopsy invasive moderately differentiated adenocarcinoma arising within a tubular adenoma with high-grade dysplasia CTs 03/13/2022-no rectal mass seen, no evidence of metastatic disease, small 2-4 mm pulmonary nodules-at least 2 are calcified, likely benign granulomas, nonobstructing stone in the interpolar right renal collecting system MRI pelvis 03/30/2022-tumor at 11-13 cm from the anal verge, T3b, early EMVI?,  No adenopathy Neoadjuvant radiation/capecitabine 04/21/2022-05/28/2022 Xeloda discontinued 05/15/2022 due  to progressive breakdown/ulceration at the left heel. Low anterior resection 08/13/2022-negative for residual cancer,[id number], 0/14 nodes margins negative, no distinct mass, 2.1 x 1.5 cm minimally ulcerated firm area of mucosa   2.   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 (reexcision 04/24/2021 and 05/22/2021, final margins clear,pT3b,pN0, ulceration present, satellitosis absent, lymphovascular invasion absent, neurotropism present, tumor infiltrating lymphocytes-nonbrisk, tumor regression absent Nonhealing left heel ulceration beginning August 2023, followed at the wound clinic Punch biopsy of left heel wound 05/11/2023-malignant melanoma in the dermis extending to the deep margins with no clear connection to the epidermis, consistent with local recurrence of melanoma, BRAF V600 negative FNA biopsy of left inguinal lymph node 05/21/2023-atypical cells present, SOX-10 negative PET 819 2024-moderately FDG avid0.7 cm left popliteal node, and 5.1 x 5.2 cm left groin mass, avidity diffusely involving the left heel melanoma resection site Cycle 1 Pembrolizumab 06/12/2023 Cycle 2 Pembrolizumab 07/03/2023 Cycle 3 pembrolizumab 07/23/2023 Cycle 4 Pembrolizumab 08/13/2023 CTs 08/19/2023-increased size of previously hypermetabolic left inguinal mass, no evidence of new metastatic disease, stable tiny pulmonary nodules-favor benign 09/03/2023 cycle 1 ipilimumab/nivolumab 09/24/2023 cycle 2 ipilimumab/nivolumab 10/15/2023 cycle 3 ipilimumab/nivolumab CTs 11/03/2023-large wound of left inguinal mass, no other evidence of metastatic disease   3.  Diabetes   4.  Laparoscopic gastric band surgery   5.  Left heel ulceration-progressive 05/15/2022.  Xeloda discontinued.  Followed at the wound clinic.      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  She has completed 3 cycles of ipilimumab/nivolumab.  The left groin mass is larger and more painful.  The left heel ulcer appears  improved.  No evidence of systemic disease progression.  She is scheduled to see Dr. Dot Lanes next week.  She may be a candidate for a clinical trial or intralesional  therapy. Ipilimumab/nivolumab will be discontinued.  She will try 25 mg of tramadol as needed for pain.  She will continue Tylenol and ibuprofen.  She will return for an office visit 11/18/2023. Thornton Papas, MD  11/05/2023  9:43 AM

## 2023-11-06 ENCOUNTER — Other Ambulatory Visit: Payer: Self-pay

## 2023-11-09 DIAGNOSIS — C4372 Malignant melanoma of left lower limb, including hip: Secondary | ICD-10-CM | POA: Diagnosis not present

## 2023-11-09 DIAGNOSIS — C19 Malignant neoplasm of rectosigmoid junction: Secondary | ICD-10-CM | POA: Diagnosis not present

## 2023-11-09 DIAGNOSIS — H539 Unspecified visual disturbance: Secondary | ICD-10-CM | POA: Diagnosis not present

## 2023-11-12 ENCOUNTER — Ambulatory Visit (INDEPENDENT_AMBULATORY_CARE_PROVIDER_SITE_OTHER): Payer: PPO | Admitting: Otolaryngology

## 2023-11-12 ENCOUNTER — Encounter (INDEPENDENT_AMBULATORY_CARE_PROVIDER_SITE_OTHER): Payer: Self-pay

## 2023-11-12 VITALS — BP 134/74 | HR 73 | Resp 19 | Ht 63.0 in | Wt 188.0 lb

## 2023-11-12 DIAGNOSIS — J320 Chronic maxillary sinusitis: Secondary | ICD-10-CM | POA: Diagnosis not present

## 2023-11-12 DIAGNOSIS — R0982 Postnasal drip: Secondary | ICD-10-CM | POA: Diagnosis not present

## 2023-11-12 MED ORDER — FLUTICASONE PROPIONATE 50 MCG/ACT NA SUSP: 2.0000 | Freq: Two times a day (BID) | NASAL | 6 refills | Status: DC

## 2023-11-12 NOTE — Progress Notes (Signed)
 Dear Dr. Renne Crigler, Here is my assessment for our mutual patient, Destiny [name]. Thank you for allowing me the opportunity to care for your patient. Please do not hesitate to contact me should you have any other questions. Sincerely, Dr. Jovita Kussmaul  Otolaryngology Clinic Note  HISTORY: Destiny [name] is a 78 y.o. female kindly referred by Dr. Renne Crigler for evaluation of chronic sinusitis.  Initial visit (10/2023):  She reports that she was sent after a MRI brain showed sinus opacification to rule out chronic sinusitis. She reports that she does not think that she has sinus problems. She reports some daily mucoid rhinorrhea bilaterally and then stops during the day. No congestion, obstruction, no facial pressure or pain, discolored drainage from the nose. She reports that her sense of smell is without issue. She reports no sinus infections. She reports no antibiotics or steroids for sinus infection. She felt like she had maybe had the flu or a URI late around thanksgiving. Denies any allergy or typical AR symptoms (itchy nose, seasonal symptoms). No nasal sx triggered by pressure or temp changes.  She reports that besides the imaging, she does not think she has any problems with her sinuses.   She reports that her only real H&N complaint is right eye with some movement of still objects and color (she reports it as "Kaleidoscope eye"). She denies any ocular pain.  She has seen Dr. Frances Furbish from Neuro and Dr. Dione Booze (eye) - no clear explanation  RADIOGRAPHIC EVALUATION AND INDEPENDENT REVIEW OF OTHER RECORDS: See above, extensive chart review CT Head (07/20/2023) and MRI brain 09/18/2023 interpreted with respect to sinuses, agree with read: Mild chronic inflammatory changes in the right maxillary sinus, progressed compared to the 07/20/2023 MRI, otherwise clear. Past Medical History:  Diagnosis Date   Actinic keratoses    Allergy    Ankle dislocation, left, initial encounter 09/23/2017   Arthritis    Blood  transfusion    in 1980   Cancer (HCC)    left heel melanoma   Diabetes mellitus    History of colon polyps    History of kidney stones    Hyperlipidemia    Hypothyroidism    Left carotid artery stenosis    Neuropathy    Neuropathy    Onychomycosis    Osteopenia    Osteoporosis    osteopenia   Penicillin allergy    PONV (postoperative nausea and vomiting)    Proteinuria    Sleep apnea    does not use cpap   Thyroid disease    hypothyroidism   Vitamin D deficiency    Past Surgical History:  Procedure Laterality Date   APPLICATION OF A-CELL OF CHEST/ABDOMEN Left 04/24/2021   Procedure: APPLICATION OF A-CELL LEFT FOOT;  Surgeon: Peggye Form, DO;  Location: [facility];  Service: Plastics;  Laterality: Left;   APPLICATION OF A-CELL OF CHEST/ABDOMEN Left 05/22/2021   Procedure: POSSIBLE APPLICATION OF A-CELL;  Surgeon: Peggye Form, DO;  Location: [facility];  Service: Plastics;  Laterality: Left;   COLONOSCOPY     EXCISION MELANOMA WITH SENTINEL LYMPH NODE BIOPSY Left 03/26/2021   Procedure: WIDE LOCAL EXCISION MELANOMA LEFT HEEL, SENTINEL LYMPH NODE MAPPING AND BIOPSY;  Surgeon: Almond Lint, MD;  Location: [facility];  Service: General;  Laterality: Left;   EXTERNAL FIXATION LEG Left 09/24/2017   Procedure: EXTERNAL FIXATION ankle;  Surgeon: Roby Lofts, MD;  Location: [facility];  Service: Orthopedics;  Laterality: Left;   EXTERNAL FIXATION REMOVAL Left 09/28/2017  Procedure: REMOVAL EXTERNAL FIXATION LEG;  Surgeon: Roby Lofts, MD;  Location: [facility];  Service: Orthopedics;  Laterality: Left;   EYE SURGERY Bilateral    cataract   FLEXIBLE SIGMOIDOSCOPY  08/13/2022   Procedure: FLEXIBLE SIGMOIDOSCOPY;  Surgeon: Romie Levee, MD;  Location: [facility];  Service: General;;   LAPAROSCOPIC GASTRIC BANDING  2010   MELANOMA EXCISION Left 04/24/2021   Procedure: REEXCISION MARGIN LEFT HEEL MELANOMA;  Surgeon: Almond Lint, MD;  Location: [facility];  Service: General;  Laterality:  Left;   MELANOMA EXCISION Left 05/22/2021   Procedure: RE-EXCISION OF LEFT HEEL MELANOMA;  Surgeon: Almond Lint, MD;  Location: [facility];  Service: General;  Laterality: Left;   OPEN REDUCTION INTERNAL FIXATION (ORIF) DISTAL RADIAL FRACTURE Right 07/29/2016   Procedure: RIGHT OPEN REDUCTION INTERNAL FIXATION (ORIF) DISTAL RADIAL FRACTURE;  Surgeon: Betha Loa, MD;  Location: [facility];  Service: Orthopedics;  Laterality: Right;   ORIF ANKLE FRACTURE Left 09/28/2017   Procedure: OPEN REDUCTION INTERNAL FIXATION (ORIF) ANKLE FRACTURE;  Surgeon: Roby Lofts, MD;  Location: [facility];  Service: Orthopedics;  Laterality: Left;   POLYPECTOMY     SKIN DEBRIDEMENT Left 05/22/2021   Procedure: RECONSTRUCTION LEFT HEEL;  Surgeon: Peggye Form, DO;  Location: [facility];  Service: Plastics;  Laterality: Left;   SKIN FULL THICKNESS GRAFT Left 04/24/2021   Procedure: RECONSTRUCTION OF LEFT HEEL;  Surgeon: Peggye Form, DO;  Location: [facility];  Service: Plastics;  Laterality: Left;   SKIN FULL THICKNESS GRAFT Left 05/22/2021   Procedure: POSSIBLE SKIN GRAFT;  Surgeon: Peggye Form, DO;  Location: [facility];  Service: Plastics;  Laterality: Left;   TOTAL ABDOMINAL HYSTERECTOMY W/ BILATERAL SALPINGOOPHORECTOMY  1980   WISDOM TOOTH EXTRACTION     XI ROBOTIC ASSISTED LOWER ANTERIOR RESECTION N/A 08/13/2022   Procedure: XI ROBOTIC ASSISTED LOWER ANTERIOR RESECTION, WITH INTRAOPERATIVE ASSESSMENT OF PERFUSION USING FIREFLY;  Surgeon: Romie Levee, MD;  Location: [facility];  Service: General;  Laterality: N/A;   Family History  Problem Relation Age of Onset   Lung cancer Sister    Ovarian cancer Sister    Throat cancer Brother    Social History   Tobacco Use   Smoking status: Former    Current packs/day: 0.00    Average packs/day: 1 pack/day for 15.0 years (15.0 ttl pk-yrs)    Types: Cigarettes    Start date: 8    Quit date: 1994    Years since quitting: 31.1   Smokeless  tobacco: Never  Substance Use Topics   Alcohol use: No   Allergies  Allergen Reactions   Aspirin Other (See Comments)    Stomach upset   Atorvastatin Other (See Comments)    cramps   Chlorhexidine Gluconate [Chlorhexidine] Itching   Crestor [Rosuvastatin]    Fentanyl Nausea Only   Lisinopril Other (See Comments)    dizziness   Penicillins Swelling    Has patient had a PCN reaction causing immediate rash, facial/tongue/throat swelling, SOB or lightheadedness with hypotension: Yes Has patient had a PCN reaction causing severe rash involving mucus membranes or skin necrosis: unknown Has patient had a PCN reaction that required hospitalization: No Has patient had a PCN reaction occurring within the last 10 years: No If all of the above answers are "NO", then may proceed with Cephalosporin use.    Hydrocodone Other (See Comments)    "Does not like how it makes me feel   Current Outpatient Medications  Medication Sig  Dispense Refill   acetaminophen (TYLENOL) 500 MG tablet Take 1,000 mg by mouth every 6 (six) hours as needed for moderate pain (pain score 4-6).     aspirin EC 81 MG tablet Take 81 mg by mouth in the morning.     bisacodyl (DULCOLAX) 5 MG EC tablet Take 5 mg by mouth daily as needed.     Cholecalciferol (VITAMIN D3) 50 MCG (2000 UT) TABS Take 2,000 Units by mouth in the morning.     denosumab (PROLIA) 60 MG/ML SOSY injection Inject 60 mg into the skin every 6 (six) months.     ezetimibe (ZETIA) 10 MG tablet Take 10 mg by mouth in the morning.     ibuprofen (ADVIL) 600 MG tablet Take 600 mg by mouth every 8 (eight) hours as needed for mild pain (pain score 1-3).     levothyroxine (SYNTHROID) 100 MCG tablet Take 100 mcg by mouth daily before breakfast.     metFORMIN (GLUCOPHAGE-XR) 500 MG 24 hr tablet Take 500 mg by mouth every evening.  2   Multiple Vitamins-Minerals (OCUVITE EXTRA) TABS Take 1 tablet by mouth in the morning and at bedtime.     ONETOUCH ULTRA test strip       pravastatin (PRAVACHOL) 40 MG tablet Take 40 mg by mouth every evening.     Semaglutide,0.25 or 0.5MG /DOS, (OZEMPIC, 0.25 OR 0.5 MG/DOSE,) 2 MG/3ML SOPN Inject 0.5 mg into the skin once a week.     traMADol (ULTRAM) 50 MG tablet Take 1 tablet (50 mg total) by mouth every 12 (twelve) hours as needed. 30 tablet 0   UNABLE TO FIND Take 600 mg by mouth daily. Med Name: Calcium     No current facility-administered medications for this visit.   BP 134/74 (BP Location: Left Arm, Patient Position: Sitting, Cuff Size: Normal)   Pulse 73   Resp 19   Ht 5\' 3"  (1.6 m)   Wt 188 lb (85.3 kg)   SpO2 92%   BMI 33.30 kg/m   PHYSICAL EXAM:  BP 134/74 (BP Location: Left Arm, Patient Position: Sitting, Cuff Size: Normal)   Pulse 73   Resp 19   Ht 5\' 3"  (1.6 m)   Wt 188 lb (85.3 kg)   SpO2 92%   BMI 33.30 kg/m    Salient findings:  CN II-XII intact  Bilateral EAC clear and TM intact with well pneumatized middle ear spaces Nose: Anterior rhinoscopy reveals mild nasal septal deviation, bilateral mild inferior turbinate hypertrophy.  Nasal endoscopy was indicated to better evaluate the nose and paranasal sinuses, given the patient's history and exam findings, and is detailed below. No lesions of oral cavity/oropharynx; No obviously palpable neck masses/lymphadenopathy/thyromegaly No respiratory distress or stridor   PROCEDURE: Diagnostic Nasal Endoscopy Pre-procedure diagnosis: Concern for chronic sinusitis Post-procedure diagnosis: same Indication: See pre-procedure diagnosis and physical exam above Complications: None apparent EBL: 0 mL Anesthesia: Lidocaine 4% and topical decongestant was topically sprayed in each nasal cavity  Description of Procedure:  Patient was identified. A rigid 30 degree endoscope was utilized to evaluate the sinonasal cavities, mucosa, sinus ostia and turbinates and septum.  Overall, signs of mucosal inflammation are not noted. No mucopurulence, polyps, or masses  noted.   Right Middle meatus: clear Right SE Recess: clear Left MM: clear Left SE Recess: clear   Photodocumentation was obtained.  CPT CODE -- 31231 - Mod 25   ASSESSMENT:  79 y.o. with:  1. Right maxillary sinusitis   2. Post-nasal drip  D/w pt about antibiotics for this but deferred given no sx and max findings not significant and would not expect to explain her sx especially eye sx. I do not think her eye symptoms are related to sinusitis and endo is reassuring We've discussed issues and options today.  We reviewed the nasal endoscopy images together.  The risks, benefits and alternatives were discussed and questions answered.  She has elected to proceed with:  1) Start flonase two puffs BID 2) Consider nasal rinses daily 3) given lack of sx, d/w pt f/u, and she opted PRN; certainly if new nasal sx or ocular sx develop  See below regarding exact medications prescribed this encounter including dosages and route:  Thank you for allowing me the opportunity to care for your patient. Please do not hesitate to contact me should you have any other questions.  Sincerely, Jovita Kussmaul, MD Otolaryngologist (ENT), [facility] Phone: [phone] Fax: [phone]  I have personally spent 50 minutes involved in face-to-face and non-face-to-face activities for this patient on the day of the visit.  Professional time spent excludes any procedures performed but includes the following activities, in addition to those noted in the documentation: preparing to see the patient (review of outside documentation and results), performing a medically appropriate examination, counseling, documenting in the electronic health record, independently interpreting results (CT)   12/09/2023, 8:46 PM

## 2023-11-13 DIAGNOSIS — C7989 Secondary malignant neoplasm of other specified sites: Secondary | ICD-10-CM | POA: Diagnosis not present

## 2023-11-13 DIAGNOSIS — R2242 Localized swelling, mass and lump, left lower limb: Secondary | ICD-10-CM | POA: Diagnosis not present

## 2023-11-13 DIAGNOSIS — C4372 Malignant melanoma of left lower limb, including hip: Secondary | ICD-10-CM | POA: Diagnosis not present

## 2023-11-13 DIAGNOSIS — C19 Malignant neoplasm of rectosigmoid junction: Secondary | ICD-10-CM | POA: Diagnosis not present

## 2023-11-16 ENCOUNTER — Telehealth: Payer: Self-pay

## 2023-11-16 DIAGNOSIS — M810 Age-related osteoporosis without current pathological fracture: Secondary | ICD-10-CM | POA: Diagnosis not present

## 2023-11-16 NOTE — Telephone Encounter (Signed)
Spoke to patient regarding her upcoming appt, pt would like to push her appt until after her biopsy results come back. Ok per MD Truett Perna. Pt verbalizes understanding that the scheduling department will contact her.

## 2023-11-18 ENCOUNTER — Other Ambulatory Visit: Payer: Self-pay

## 2023-11-18 ENCOUNTER — Ambulatory Visit: Payer: PPO | Admitting: Oncology

## 2023-11-21 ENCOUNTER — Other Ambulatory Visit: Payer: Self-pay | Admitting: Oncology

## 2023-11-24 DIAGNOSIS — E1140 Type 2 diabetes mellitus with diabetic neuropathy, unspecified: Secondary | ICD-10-CM | POA: Diagnosis not present

## 2023-11-24 DIAGNOSIS — E7800 Pure hypercholesterolemia, unspecified: Secondary | ICD-10-CM | POA: Diagnosis not present

## 2023-11-24 DIAGNOSIS — C4372 Malignant melanoma of left lower limb, including hip: Secondary | ICD-10-CM | POA: Diagnosis not present

## 2023-11-25 ENCOUNTER — Encounter: Payer: Self-pay | Admitting: Nurse Practitioner

## 2023-11-25 ENCOUNTER — Inpatient Hospital Stay: Payer: PPO | Attending: Oncology | Admitting: Nurse Practitioner

## 2023-11-25 VITALS — BP 107/63 | HR 72 | Temp 97.8°F | Resp 18 | Ht 63.0 in | Wt 189.6 lb

## 2023-11-25 DIAGNOSIS — L97429 Non-pressure chronic ulcer of left heel and midfoot with unspecified severity: Secondary | ICD-10-CM | POA: Insufficient documentation

## 2023-11-25 DIAGNOSIS — C20 Malignant neoplasm of rectum: Secondary | ICD-10-CM | POA: Insufficient documentation

## 2023-11-25 DIAGNOSIS — C4372 Malignant melanoma of left lower limb, including hip: Secondary | ICD-10-CM | POA: Insufficient documentation

## 2023-11-25 DIAGNOSIS — E119 Type 2 diabetes mellitus without complications: Secondary | ICD-10-CM | POA: Diagnosis not present

## 2023-11-25 DIAGNOSIS — C439 Malignant melanoma of skin, unspecified: Secondary | ICD-10-CM | POA: Diagnosis not present

## 2023-11-25 DIAGNOSIS — Z5112 Encounter for antineoplastic immunotherapy: Secondary | ICD-10-CM | POA: Insufficient documentation

## 2023-11-25 DIAGNOSIS — Z9884 Bariatric surgery status: Secondary | ICD-10-CM | POA: Insufficient documentation

## 2023-11-25 NOTE — Progress Notes (Signed)
[facility] OFFICE PROGRESS NOTE   Diagnosis:  Melanoma  INTERVAL HISTORY:   Destiny [name] returns to scheduled.  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  She continues to have pain at the left upper leg.  The left heel lesion continues to be improved.  No nausea or vomiting.  No diarrhea.  Objective:  Vital signs in last 24 hours:  Blood pressure 107/63, pulse 72, temperature 97.8 F (36.6 C), temperature source Oral, resp. rate 18, height 5\' 3"  (1.6 m), weight 189 lb 9.6 oz (86 kg), SpO2 95%.    HEENT: No thrush or ulcers. Lymphatics: Large firm mass left inguinal region. Resp: Lungs clear bilaterally. Cardio: Regular rate and rhythm. GI: No hepatosplenomegaly. Vascular: Edema throughout the left leg. Neuro: Alert and oriented. Skin: Left heel is wrapped.   Lab Results:  Lab Results  Component Value Date   WBC 4.5 11/05/2023   HGB 13.2 11/05/2023   HCT 42.2 11/05/2023   MCV 91.9 11/05/2023   PLT 266 11/05/2023   NEUTROABS 3.0 11/05/2023    Imaging:  No results found.  Medications: I have reviewed the patient's current medications.  Assessment/Plan: Rectal cancer Colonoscopy 03/06/2022-nonobstructing mass at 12 cm from the anal verge-biopsy invasive moderately differentiated adenocarcinoma arising within a tubular adenoma with high-grade dysplasia CTs 03/13/2022-no rectal mass seen, no evidence of metastatic disease, small 2-4 mm pulmonary nodules-at least 2 are calcified, likely benign granulomas, nonobstructing stone in the interpolar right renal collecting system MRI pelvis 03/30/2022-tumor at 11-13 cm from the anal verge, T3b, early EMVI?,  No adenopathy Neoadjuvant radiation/capecitabine 04/21/2022-05/28/2022 Xeloda discontinued 05/15/2022 due to progressive breakdown/ulceration at the left heel. Low anterior resection 08/13/2022-negative for residual  cancer,ypT0ypN0, 0/14 nodes margins negative, no distinct mass, 2.1 x 1.5 cm minimally ulcerated firm area of mucosa   2.   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 (reexcision 04/24/2021 and 05/22/2021, final margins clear,pT3b,pN0, ulceration present, satellitosis absent, lymphovascular invasion absent, neurotropism present, tumor infiltrating lymphocytes-nonbrisk, tumor regression absent Nonhealing left heel ulceration beginning August 2023, followed at the wound clinic Punch biopsy of left heel wound 05/11/2023-malignant melanoma in the dermis extending to the deep margins with no clear connection to the epidermis, consistent with local recurrence of melanoma, BRAF V600 negative FNA biopsy of left inguinal lymph node 05/21/2023-atypical cells present, SOX-10 negative PET 819 2024-moderately FDG avid0.7 cm left popliteal node, and 5.1 x 5.2 cm left groin mass, avidity diffusely involving the left heel melanoma resection site Cycle 1 Pembrolizumab 06/12/2023 Cycle 2 Pembrolizumab 07/03/2023 Cycle 3 pembrolizumab 07/23/2023 Cycle 4 Pembrolizumab 08/13/2023 CTs 08/19/2023-increased size of previously hypermetabolic left inguinal mass, no evidence of new metastatic disease, stable tiny pulmonary nodules-favor benign 09/03/2023 cycle 1 ipilimumab/nivolumab 09/24/2023 cycle 2 ipilimumab/nivolumab 10/15/2023 cycle 3 ipilimumab/nivolumab CTs 11/03/2023-large left inguinal mass, no other evidence of metastatic disease Biopsy left groin mass 11/13/2023-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   3.  Diabetes   4.  Laparoscopic gastric band surgery   5.  Left heel ulceration-progressive 05/15/2022.  Xeloda discontinued.  Followed at the wound clinic.    Disposition: Destiny [name] appears stable.  The biopsy of the left groin mass confirms melanoma.  We reviewed this result with her at today's visit.  Dr. Truett Perna spoke with the [facility]  melanoma team while Destiny [name] was in the office today.  She may be a candidate for surgical resection.  We placed a referral to Dr. Lenis Noon at [facility].  We will arrange for follow-up pending the decision regarding surgery.  Patient seen with Dr. Truett Perna.    Lonna Cobb ANP/GNP-BC   11/25/2023  1:19 PM  This was a shared visit with Lonna Cobb.  Destiny [name] was interviewed and examined.  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  I discussed the case with Dr. Steele Sizer when she saw Destiny [name] on 11/09/2023.  I discussed the case with the [facility] myeloma coordinator, Destiny [name], today.  She indicates Destiny [name]'s case was presented at the [facility] melanoma conference today.  The consensus recommendation is to proceed with surgical evaluation to consider resection of the dominant left inguinal mass.  If she is not a surgical candidate enrollment in a radiation clinical trial with a tumor directed agent-activated by radiation.  She is also a potential candidate for TVEC therapy and additional immunotherapy.  Destiny [name] will be referred to Dr. Lenis Noon to consider surgery.   I was present for greater than 50% of today's visit.  I performed medical decision making.  Mancel Bale, MD

## 2023-11-26 ENCOUNTER — Encounter: Payer: Self-pay | Admitting: *Deleted

## 2023-11-26 NOTE — Progress Notes (Signed)
PATIENT NAVIGATOR PROGRESS NOTE  Name: Destiny [name] Date: 11/26/2023 MRN: [id number]  DOB: June 22, 1946   Reason for visit:  F/U appt with Dr Lenis Noon  Comments:  Called AWF and set up appt with Dr Lenis Noon to discuss surgical options for 12/09/23 at 0930   Patient called and informed of appt    Time spent counseling/coordinating care: 30-45 minutes

## 2023-11-26 NOTE — Progress Notes (Signed)
Coordination with [facility], Destiny [name] is going to see surgeon Dr Roselyn Bering on 12/02/23

## 2023-12-01 DIAGNOSIS — M810 Age-related osteoporosis without current pathological fracture: Secondary | ICD-10-CM | POA: Diagnosis not present

## 2023-12-01 DIAGNOSIS — C439 Malignant melanoma of skin, unspecified: Secondary | ICD-10-CM | POA: Diagnosis not present

## 2023-12-01 DIAGNOSIS — E559 Vitamin D deficiency, unspecified: Secondary | ICD-10-CM | POA: Diagnosis not present

## 2023-12-01 DIAGNOSIS — E039 Hypothyroidism, unspecified: Secondary | ICD-10-CM | POA: Diagnosis not present

## 2023-12-01 DIAGNOSIS — E11610 Type 2 diabetes mellitus with diabetic neuropathic arthropathy: Secondary | ICD-10-CM | POA: Diagnosis not present

## 2023-12-01 DIAGNOSIS — E7800 Pure hypercholesterolemia, unspecified: Secondary | ICD-10-CM | POA: Diagnosis not present

## 2023-12-02 DIAGNOSIS — C4370 Malignant melanoma of unspecified lower limb, including hip: Secondary | ICD-10-CM | POA: Diagnosis not present

## 2023-12-04 ENCOUNTER — Encounter: Payer: Self-pay | Admitting: *Deleted

## 2023-12-04 NOTE — Progress Notes (Signed)
 Spoke with Destiny [name] and she is having surgery at [facility] on 12/17/23   Rescheduled appt with Dr Truett Perna to 01/05/24 for follow up

## 2023-12-06 DIAGNOSIS — C439 Malignant melanoma of skin, unspecified: Secondary | ICD-10-CM | POA: Diagnosis not present

## 2023-12-07 DIAGNOSIS — C439 Malignant melanoma of skin, unspecified: Secondary | ICD-10-CM | POA: Diagnosis not present

## 2023-12-17 DIAGNOSIS — C439 Malignant melanoma of skin, unspecified: Secondary | ICD-10-CM | POA: Diagnosis not present

## 2023-12-17 DIAGNOSIS — R1032 Left lower quadrant pain: Secondary | ICD-10-CM | POA: Diagnosis not present

## 2023-12-17 DIAGNOSIS — C4372 Malignant melanoma of left lower limb, including hip: Secondary | ICD-10-CM | POA: Diagnosis not present

## 2023-12-17 DIAGNOSIS — Z85820 Personal history of malignant melanoma of skin: Secondary | ICD-10-CM | POA: Diagnosis not present

## 2023-12-17 DIAGNOSIS — C774 Secondary and unspecified malignant neoplasm of inguinal and lower limb lymph nodes: Secondary | ICD-10-CM | POA: Diagnosis not present

## 2023-12-19 DIAGNOSIS — Z7982 Long term (current) use of aspirin: Secondary | ICD-10-CM | POA: Diagnosis not present

## 2023-12-19 DIAGNOSIS — E1140 Type 2 diabetes mellitus with diabetic neuropathy, unspecified: Secondary | ICD-10-CM | POA: Diagnosis not present

## 2023-12-19 DIAGNOSIS — C4370 Malignant melanoma of unspecified lower limb, including hip: Secondary | ICD-10-CM | POA: Diagnosis not present

## 2023-12-19 DIAGNOSIS — C4372 Malignant melanoma of left lower limb, including hip: Secondary | ICD-10-CM | POA: Diagnosis not present

## 2023-12-19 DIAGNOSIS — C7989 Secondary malignant neoplasm of other specified sites: Secondary | ICD-10-CM | POA: Diagnosis not present

## 2023-12-19 DIAGNOSIS — Z90710 Acquired absence of both cervix and uterus: Secondary | ICD-10-CM | POA: Diagnosis not present

## 2023-12-19 DIAGNOSIS — N200 Calculus of kidney: Secondary | ICD-10-CM | POA: Diagnosis not present

## 2023-12-19 DIAGNOSIS — I2510 Atherosclerotic heart disease of native coronary artery without angina pectoris: Secondary | ICD-10-CM | POA: Diagnosis not present

## 2023-12-19 DIAGNOSIS — K760 Fatty (change of) liver, not elsewhere classified: Secondary | ICD-10-CM | POA: Diagnosis not present

## 2023-12-19 DIAGNOSIS — R1909 Other intra-abdominal and pelvic swelling, mass and lump: Secondary | ICD-10-CM | POA: Diagnosis not present

## 2023-12-20 DIAGNOSIS — E785 Hyperlipidemia, unspecified: Secondary | ICD-10-CM | POA: Diagnosis not present

## 2023-12-20 DIAGNOSIS — T8131XD Disruption of external operation (surgical) wound, not elsewhere classified, subsequent encounter: Secondary | ICD-10-CM | POA: Diagnosis not present

## 2023-12-20 DIAGNOSIS — C774 Secondary and unspecified malignant neoplasm of inguinal and lower limb lymph nodes: Secondary | ICD-10-CM | POA: Diagnosis not present

## 2023-12-20 DIAGNOSIS — Z602 Problems related to living alone: Secondary | ICD-10-CM | POA: Diagnosis not present

## 2023-12-20 DIAGNOSIS — Z85038 Personal history of other malignant neoplasm of large intestine: Secondary | ICD-10-CM | POA: Diagnosis not present

## 2023-12-20 DIAGNOSIS — E119 Type 2 diabetes mellitus without complications: Secondary | ICD-10-CM | POA: Diagnosis not present

## 2023-12-20 DIAGNOSIS — Z9181 History of falling: Secondary | ICD-10-CM | POA: Diagnosis not present

## 2023-12-20 DIAGNOSIS — C4372 Malignant melanoma of left lower limb, including hip: Secondary | ICD-10-CM | POA: Diagnosis not present

## 2023-12-20 DIAGNOSIS — E039 Hypothyroidism, unspecified: Secondary | ICD-10-CM | POA: Diagnosis not present

## 2023-12-20 DIAGNOSIS — Z791 Long term (current) use of non-steroidal anti-inflammatories (NSAID): Secondary | ICD-10-CM | POA: Diagnosis not present

## 2023-12-20 DIAGNOSIS — Z79890 Hormone replacement therapy (postmenopausal): Secondary | ICD-10-CM | POA: Diagnosis not present

## 2023-12-20 DIAGNOSIS — Z483 Aftercare following surgery for neoplasm: Secondary | ICD-10-CM | POA: Diagnosis not present

## 2023-12-20 DIAGNOSIS — I10 Essential (primary) hypertension: Secondary | ICD-10-CM | POA: Diagnosis not present

## 2023-12-20 DIAGNOSIS — Z7982 Long term (current) use of aspirin: Secondary | ICD-10-CM | POA: Diagnosis not present

## 2023-12-21 ENCOUNTER — Telehealth: Payer: Self-pay

## 2023-12-21 NOTE — Transitions of Care (Post Inpatient/ED Visit) (Signed)
 12/21/2023  Name: Destiny [name] MRN: [id number] DOB: 02-10-1946  Today's TOC FU Call Status: Today's TOC FU Call Status:: Successful TOC FU Call Completed TOC FU Call Complete Date: 12/21/23 Patient's Name and Date of Birth confirmed.  Transition Care Management Follow-up Telephone Call Date of Discharge: 12/19/23 Discharge Facility: Other ([facility]) Name of Other ([facility]) Discharge Facility: [facility] Type of Discharge: Inpatient Admission Primary Inpatient Discharge Diagnosis:: Melanoma to the heel How have you been since you were released from the hospital?: Better Any questions or concerns?: No  Items Reviewed: Did you receive and understand the discharge instructions provided?: Yes Medications obtained,verified, and reconciled?: Yes (Medications Reviewed) Any new allergies since your discharge?: No Dietary orders reviewed?: Yes Type of Diet Ordered:: Low Sodium Heart Healthy Do you have support at home?: Yes People in Home: child(ren), adult Name of Support/Comfort Primary Source: Suleima Ohlendorf  Medications Reviewed Today: Medications Reviewed Today     Reviewed by Redge Gainer, RN (Case Manager) on 12/21/23 at 1547  Med List Status: <None>   Medication Order Taking? Sig Documenting Provider Last Dose Status Informant  acetaminophen (TYLENOL) 500 MG tablet [id number] No Take 1,000 mg by mouth every 6 (six) hours as needed for moderate pain (pain score 4-6). [provider] Taking Active Self  aspirin EC 81 MG tablet [id number] No Take 81 mg by mouth in the morning. [provider] Taking Active Self  bisacodyl (DULCOLAX) 5 MG EC tablet [id number] No Take 5 mg by mouth daily as needed. [provider] Taking Active   Cholecalciferol (VITAMIN D3) 50 MCG (2000 UT) TABS [id number] No Take 2,000 Units by mouth in the morning. [provider] Taking Active Self  denosumab (PROLIA) 60 MG/ML SOSY injection [id number] No Inject 60 mg into  the skin every 6 (six) months. [provider] Taking Active Self           Med Note Tobias Alexander Jul 20, 2023  7:57 PM)    ezetimibe (ZETIA) 10 MG tablet [id number] No Take 10 mg by mouth in the morning. [provider] Taking Active Self  ibuprofen (ADVIL) 600 MG tablet [id number] No Take 600 mg by mouth every 8 (eight) hours as needed for mild pain (pain score 1-3). [provider] Taking Active   levothyroxine (SYNTHROID) 100 MCG tablet [id number] No Take 100 mcg by mouth daily before breakfast. [provider] Taking Active Self  metFORMIN (GLUCOPHAGE-XR) 500 MG 24 hr tablet [id number] No Take 500 mg by mouth every evening. [provider] Taking Active Self  Multiple Vitamins-Minerals (OCUVITE EXTRA) TABS [id number] No Take 1 tablet by mouth in the morning and at bedtime. [provider] Taking Active Self  Koren Bound test strip [id number] No  [provider] Taking Active Self  pravastatin (PRAVACHOL) 40 MG tablet [id number] No Take 40 mg by mouth every evening. [provider] Taking Active Self  Semaglutide,0.25 or 0.5MG /DOS, (OZEMPIC, 0.25 OR 0.5 MG/DOSE,) 2 MG/3ML SOPN [id number] No Inject 0.5 mg into the skin once a week. [provider] Taking Active   traMADol (ULTRAM) 50 MG tablet [id number] No Take 1 tablet (50 mg total) by mouth every 12 (twelve) hours as needed. Rana Snare, NP Taking Active   UNABLE TO FIND [id number] No Take 600 mg by mouth daily. Med Name: Calcium [provider] Taking Active             Home Care and Equipment/Supplies: Were Home Health Services Ordered?:  Yes Name of Home Health Agency:: [facility] Has Agency set up a time to come to your home?: No EMR reviewed for Home Health Orders: Orders present/patient has not received call (refer to CM for follow-up) Any new equipment or medical supplies ordered?: No  Functional Questionnaire: Do you need  assistance with bathing/showering or dressing?: No Do you need assistance with meal preparation?: No Do you need assistance with eating?: No Do you have difficulty maintaining continence: No Do you need assistance with getting out of bed/getting out of a chair/moving?: No Do you have difficulty managing or taking your medications?: No  Follow up appointments reviewed: PCP Follow-up appointment confirmed?: NA Specialist Hospital Follow-up appointment confirmed?: Yes Date of Specialist follow-up appointment?: 12/31/23 Follow-Up Specialty Provider:: Dr. Tama Gander Do you need transportation to your follow-up appointment?: No Do you understand care options if your condition(s) worsen?: Yes-patient verbalized understanding  SDOH Interventions Today    Flowsheet Row Most Recent Value  SDOH Interventions   Food Insecurity Interventions Intervention Not Indicated  Housing Interventions Intervention Not Indicated  Transportation Interventions Intervention Not Indicated  Utilities Interventions Intervention Not Indicated      Interventions Today    Flowsheet Row Most Recent Value  Chronic Disease   Chronic disease during today's visit Other  [Cancer]  General Interventions   General Interventions Discussed/Reviewed General Interventions Discussed, General Interventions Reviewed, Doctor Visits  Doctor Visits Discussed/Reviewed Doctor Visits Reviewed  Exercise Interventions   Exercise Discussed/Reviewed Physical Activity  Physical Activity Discussed/Reviewed Physical Activity Reviewed  Education Interventions   Education Provided Provided Education  Provided Verbal Education On When to see the doctor, Medication, Insurance Plans  Pharmacy Interventions   Pharmacy Dicussed/Reviewed Medications and their functions       The patient has been provided with contact information for the care management team and has been advised to call with any health-related questions or concerns. The patient  verbalized understanding with current POC. The patient is directed to their insurance card regarding availability of benefits coverage.  Deidre Ala, BSN, RN [place]  VBCI - [employer] [facility] RN Care Manager [phone]

## 2023-12-24 ENCOUNTER — Ambulatory Visit: Payer: PPO | Admitting: Oncology

## 2023-12-29 DIAGNOSIS — Z483 Aftercare following surgery for neoplasm: Secondary | ICD-10-CM | POA: Diagnosis not present

## 2023-12-29 DIAGNOSIS — E119 Type 2 diabetes mellitus without complications: Secondary | ICD-10-CM | POA: Diagnosis not present

## 2023-12-29 DIAGNOSIS — C774 Secondary and unspecified malignant neoplasm of inguinal and lower limb lymph nodes: Secondary | ICD-10-CM | POA: Diagnosis not present

## 2023-12-31 DIAGNOSIS — E119 Type 2 diabetes mellitus without complications: Secondary | ICD-10-CM | POA: Diagnosis not present

## 2023-12-31 DIAGNOSIS — C4359 Malignant melanoma of other part of trunk: Secondary | ICD-10-CM | POA: Diagnosis not present

## 2023-12-31 DIAGNOSIS — Z4802 Encounter for removal of sutures: Secondary | ICD-10-CM | POA: Diagnosis not present

## 2023-12-31 DIAGNOSIS — T8149XA Infection following a procedure, other surgical site, initial encounter: Secondary | ICD-10-CM | POA: Diagnosis not present

## 2024-01-05 ENCOUNTER — Ambulatory Visit: Payer: PPO | Admitting: Oncology

## 2024-01-05 ENCOUNTER — Other Ambulatory Visit: Payer: Self-pay | Admitting: *Deleted

## 2024-01-05 DIAGNOSIS — Z79899 Other long term (current) drug therapy: Secondary | ICD-10-CM | POA: Diagnosis not present

## 2024-01-05 DIAGNOSIS — C4372 Malignant melanoma of left lower limb, including hip: Secondary | ICD-10-CM | POA: Diagnosis not present

## 2024-01-05 NOTE — Progress Notes (Signed)
 Opened in error

## 2024-01-07 DIAGNOSIS — C4372 Malignant melanoma of left lower limb, including hip: Secondary | ICD-10-CM | POA: Diagnosis not present

## 2024-01-07 DIAGNOSIS — Z09 Encounter for follow-up examination after completed treatment for conditions other than malignant neoplasm: Secondary | ICD-10-CM | POA: Diagnosis not present

## 2024-01-12 ENCOUNTER — Inpatient Hospital Stay: Attending: Oncology | Admitting: Oncology

## 2024-01-12 ENCOUNTER — Telehealth: Payer: Self-pay | Admitting: Oncology

## 2024-01-12 VITALS — BP 101/65 | HR 83 | Temp 98.1°F | Resp 18 | Ht 63.0 in | Wt 188.1 lb

## 2024-01-12 DIAGNOSIS — E11621 Type 2 diabetes mellitus with foot ulcer: Secondary | ICD-10-CM | POA: Insufficient documentation

## 2024-01-12 DIAGNOSIS — Z9884 Bariatric surgery status: Secondary | ICD-10-CM | POA: Insufficient documentation

## 2024-01-12 DIAGNOSIS — C775 Secondary and unspecified malignant neoplasm of intrapelvic lymph nodes: Secondary | ICD-10-CM | POA: Diagnosis not present

## 2024-01-12 DIAGNOSIS — Z9221 Personal history of antineoplastic chemotherapy: Secondary | ICD-10-CM | POA: Diagnosis not present

## 2024-01-12 DIAGNOSIS — Z85048 Personal history of other malignant neoplasm of rectum, rectosigmoid junction, and anus: Secondary | ICD-10-CM | POA: Diagnosis not present

## 2024-01-12 DIAGNOSIS — Z923 Personal history of irradiation: Secondary | ICD-10-CM | POA: Insufficient documentation

## 2024-01-12 DIAGNOSIS — C439 Malignant melanoma of skin, unspecified: Secondary | ICD-10-CM | POA: Insufficient documentation

## 2024-01-12 DIAGNOSIS — R600 Localized edema: Secondary | ICD-10-CM | POA: Insufficient documentation

## 2024-01-12 NOTE — Telephone Encounter (Signed)
 Patient has been scheduled for follow-up visit per 01/12/24 LOS.  Pt aware of appt details

## 2024-01-12 NOTE — Progress Notes (Signed)
 [facility] OFFICE PROGRESS NOTE   Diagnosis: Melanoma  INTERVAL HISTORY:   Destiny [name] underwent radical resection of left inguinal lymph nodes, a femoral vein excision/interposition graft, sartorius flap, and drain placement on 12/17/2023.  The pathology revealed metastatic melanoma being matted lymph nodes in fibroadipose tissue.  The largest tumor deposit measured 130 x 90 mm.  Extracapsular extension is present.  Viable tumor is present at the 3-6:00 soft tissue margin. She reports developing a wound infection and is completing a course of clindamycin.  A wound VAC is scheduled to be placed later today.  A decision was made to abort wide excision of the left heel due to the left groin vascular invasion.  She saw Dr. Dot Lanes on 01/05/2024.  She recommends a restaging PET 02/16/2024.  If there is no evidence of systemic disease progression TVEC is recommended.  If there is systemic disease Dr Dot Lanes commends Opdualag.  Destiny [name] reports developing left leg edema following surgery.  She reports the left heel has not changed. Objective:  Vital signs in last 24 hours:  Blood pressure 101/65, pulse 83, temperature 98.1 F (36.7 C), temperature source Temporal, resp. rate 18, height 5\' 3"  (1.6 m), weight 188 lb 1.6 oz (85.3 kg), SpO2 97%.    Lymphatics: No cervical, supraclavicular, axillary, or right inguinal nodes. Resp: Lungs clear bilaterally Cardio: Regular rate and rhythm with frequent premature beats GI: No hepatosplenomegaly, no mass, left groin incision is open with a packing in place.  No surrounding erythema. Vascular: Edema throughout the left leg  Skin: Gauze wrap in place at the left heel   Lab Results:  Lab Results  Component Value Date   WBC 4.5 11/05/2023   HGB 13.2 11/05/2023   HCT 42.2 11/05/2023   MCV 91.9 11/05/2023   PLT 266 11/05/2023   NEUTROABS 3.0 11/05/2023    CMP  Lab Results  Component Value Date   NA 144 11/05/2023   K 4.0  11/05/2023   CL 103 11/05/2023   CO2 30 11/05/2023   GLUCOSE 109 (H) 11/05/2023   BUN 12 11/05/2023   CREATININE 0.81 11/05/2023   CALCIUM 9.7 11/05/2023   PROT 6.7 11/05/2023   ALBUMIN 4.5 11/05/2023   AST 14 (L) 11/05/2023   ALT 9 11/05/2023   ALKPHOS 64 11/05/2023   BILITOT 0.5 11/05/2023   GFRNONAA >60 11/05/2023   GFRAA >60 09/24/2017    Lab Results  Component Value Date   CEA 1.61 09/03/2023   EKG 01/12/2024: Sinus tachycardia  Medications: I have reviewed the patient's current medications.   Assessment/Plan: Rectal cancer Colonoscopy 03/06/2022-nonobstructing mass at 12 cm from the anal verge-biopsy invasive moderately differentiated adenocarcinoma arising within a tubular adenoma with high-grade dysplasia CTs 03/13/2022-no rectal mass seen, no evidence of metastatic disease, small 2-4 mm pulmonary nodules-at least 2 are calcified, likely benign granulomas, nonobstructing stone in the interpolar right renal collecting system MRI pelvis 03/30/2022-tumor at 11-13 cm from the anal verge, T3b, early EMVI?,  No adenopathy Neoadjuvant radiation/capecitabine 04/21/2022-05/28/2022 Xeloda discontinued 05/15/2022 due to progressive breakdown/ulceration at the left heel. Low anterior resection 08/13/2022-negative for residual cancer,ypT0ypN0, 0/14 nodes margins negative, no distinct mass, 2.1 x 1.5 cm minimally ulcerated firm area of mucosa   2.   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 (reexcision 04/24/2021 and 05/22/2021, final margins clear,pT3b,pN0, ulceration present, satellitosis absent, lymphovascular invasion absent, neurotropism present, tumor infiltrating lymphocytes-nonbrisk, tumor regression absent Nonhealing left heel ulceration beginning August 2023, followed at the wound  clinic Punch biopsy of left heel wound 05/11/2023-malignant melanoma in the dermis extending to the deep margins with no clear connection to the epidermis, consistent with local  recurrence of melanoma, BRAF V600 negative FNA biopsy of left inguinal lymph node 05/21/2023-atypical cells present, SOX-10 negative PET 819 2024-moderately FDG avid0.7 cm left popliteal node, and 5.1 x 5.2 cm left groin mass, avidity diffusely involving the left heel melanoma resection site Cycle 1 Pembrolizumab 06/12/2023 Cycle 2 Pembrolizumab 07/03/2023 Cycle 3 pembrolizumab 07/23/2023 Cycle 4 Pembrolizumab 08/13/2023 CTs 08/19/2023-increased size of previously hypermetabolic left inguinal mass, no evidence of new metastatic disease, stable tiny pulmonary nodules-favor benign 09/03/2023 cycle 1 ipilimumab/nivolumab 09/24/2023 cycle 2 ipilimumab/nivolumab 10/15/2023 cycle 3 ipilimumab/nivolumab CTs 11/03/2023-large left inguinal mass, no other evidence of metastatic disease Biopsy left groin mass 11/13/2023-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 Left inguinal lymph node dissection, femoral vein excision/interposition graft, and sartorius flap 12/17/2023-static melanoma involving fibroadipose tissue and matted lymph nodes, extracapsular extension, positive soft tissue margin   3.  Diabetes   4.  Laparoscopic gastric band surgery   5.  Left heel ulceration-progressive 05/15/2022.  Xeloda discontinued.  Followed at the wound clinic.      Disposition: Destiny [name] is recovering from the left inguinal lymph node dissection.  The lymph nodes were resected including a dominant mass.  The soft tissue margin was positive.  She saw Dr. Dot Lanes following surgery.  A restaging PET is scheduled 02/16/2024.  Dr. Dot Lanes will see her on 02/16/2024 and recommend treatment options, likely TVEC versus Opdualag.  Excision of the left heel is also being considered.  Destiny [name] is scheduled for wound VAC placement later today.  She will return for an office visit 02/24/2024.  Thornton Papas, MD  01/12/2024  10:13 AM

## 2024-01-13 DIAGNOSIS — T8189XA Other complications of procedures, not elsewhere classified, initial encounter: Secondary | ICD-10-CM | POA: Diagnosis not present

## 2024-01-14 DIAGNOSIS — C439 Malignant melanoma of skin, unspecified: Secondary | ICD-10-CM | POA: Diagnosis not present

## 2024-01-15 DIAGNOSIS — M810 Age-related osteoporosis without current pathological fracture: Secondary | ICD-10-CM | POA: Diagnosis not present

## 2024-01-15 DIAGNOSIS — I10 Essential (primary) hypertension: Secondary | ICD-10-CM | POA: Diagnosis not present

## 2024-01-15 DIAGNOSIS — E039 Hypothyroidism, unspecified: Secondary | ICD-10-CM | POA: Diagnosis not present

## 2024-01-18 DIAGNOSIS — T8189XA Other complications of procedures, not elsewhere classified, initial encounter: Secondary | ICD-10-CM | POA: Diagnosis not present

## 2024-01-20 DIAGNOSIS — I890 Lymphedema, not elsewhere classified: Secondary | ICD-10-CM | POA: Diagnosis not present

## 2024-01-20 DIAGNOSIS — R3129 Other microscopic hematuria: Secondary | ICD-10-CM | POA: Diagnosis not present

## 2024-01-20 DIAGNOSIS — G4733 Obstructive sleep apnea (adult) (pediatric): Secondary | ICD-10-CM | POA: Diagnosis not present

## 2024-01-20 DIAGNOSIS — J309 Allergic rhinitis, unspecified: Secondary | ICD-10-CM | POA: Diagnosis not present

## 2024-01-20 DIAGNOSIS — Z0000 Encounter for general adult medical examination without abnormal findings: Secondary | ICD-10-CM | POA: Diagnosis not present

## 2024-01-20 DIAGNOSIS — C439 Malignant melanoma of skin, unspecified: Secondary | ICD-10-CM | POA: Diagnosis not present

## 2024-01-20 DIAGNOSIS — I10 Essential (primary) hypertension: Secondary | ICD-10-CM | POA: Diagnosis not present

## 2024-01-20 DIAGNOSIS — L97525 Non-pressure chronic ulcer of other part of left foot with muscle involvement without evidence of necrosis: Secondary | ICD-10-CM | POA: Diagnosis not present

## 2024-01-20 DIAGNOSIS — E039 Hypothyroidism, unspecified: Secondary | ICD-10-CM | POA: Diagnosis not present

## 2024-01-20 DIAGNOSIS — M810 Age-related osteoporosis without current pathological fracture: Secondary | ICD-10-CM | POA: Diagnosis not present

## 2024-01-20 DIAGNOSIS — E1140 Type 2 diabetes mellitus with diabetic neuropathy, unspecified: Secondary | ICD-10-CM | POA: Diagnosis not present

## 2024-01-20 DIAGNOSIS — I2510 Atherosclerotic heart disease of native coronary artery without angina pectoris: Secondary | ICD-10-CM | POA: Diagnosis not present

## 2024-01-26 DIAGNOSIS — H02834 Dermatochalasis of left upper eyelid: Secondary | ICD-10-CM | POA: Diagnosis not present

## 2024-01-26 DIAGNOSIS — H43393 Other vitreous opacities, bilateral: Secondary | ICD-10-CM | POA: Diagnosis not present

## 2024-01-26 DIAGNOSIS — H04123 Dry eye syndrome of bilateral lacrimal glands: Secondary | ICD-10-CM | POA: Diagnosis not present

## 2024-01-26 DIAGNOSIS — H353132 Nonexudative age-related macular degeneration, bilateral, intermediate dry stage: Secondary | ICD-10-CM | POA: Diagnosis not present

## 2024-01-26 DIAGNOSIS — H1045 Other chronic allergic conjunctivitis: Secondary | ICD-10-CM | POA: Diagnosis not present

## 2024-01-26 DIAGNOSIS — H26491 Other secondary cataract, right eye: Secondary | ICD-10-CM | POA: Diagnosis not present

## 2024-01-26 DIAGNOSIS — E119 Type 2 diabetes mellitus without complications: Secondary | ICD-10-CM | POA: Diagnosis not present

## 2024-01-26 DIAGNOSIS — T8189XA Other complications of procedures, not elsewhere classified, initial encounter: Secondary | ICD-10-CM | POA: Diagnosis not present

## 2024-01-26 DIAGNOSIS — H02831 Dermatochalasis of right upper eyelid: Secondary | ICD-10-CM | POA: Diagnosis not present

## 2024-02-04 DIAGNOSIS — C439 Malignant melanoma of skin, unspecified: Secondary | ICD-10-CM | POA: Diagnosis not present

## 2024-02-09 DIAGNOSIS — S75102D Unspecified injury of femoral vein at hip and thigh level, left leg, subsequent encounter: Secondary | ICD-10-CM | POA: Diagnosis not present

## 2024-02-09 DIAGNOSIS — Z87891 Personal history of nicotine dependence: Secondary | ICD-10-CM | POA: Diagnosis not present

## 2024-02-09 DIAGNOSIS — Z09 Encounter for follow-up examination after completed treatment for conditions other than malignant neoplasm: Secondary | ICD-10-CM | POA: Diagnosis not present

## 2024-02-09 DIAGNOSIS — C7989 Secondary malignant neoplasm of other specified sites: Secondary | ICD-10-CM | POA: Diagnosis not present

## 2024-02-09 DIAGNOSIS — E119 Type 2 diabetes mellitus without complications: Secondary | ICD-10-CM | POA: Diagnosis not present

## 2024-02-09 DIAGNOSIS — Z95828 Presence of other vascular implants and grafts: Secondary | ICD-10-CM | POA: Diagnosis not present

## 2024-02-09 DIAGNOSIS — C439 Malignant melanoma of skin, unspecified: Secondary | ICD-10-CM | POA: Diagnosis not present

## 2024-02-13 DIAGNOSIS — C439 Malignant melanoma of skin, unspecified: Secondary | ICD-10-CM | POA: Diagnosis not present

## 2024-02-16 DIAGNOSIS — Z79899 Other long term (current) drug therapy: Secondary | ICD-10-CM | POA: Diagnosis not present

## 2024-02-16 DIAGNOSIS — K429 Umbilical hernia without obstruction or gangrene: Secondary | ICD-10-CM | POA: Diagnosis not present

## 2024-02-16 DIAGNOSIS — C4372 Malignant melanoma of left lower limb, including hip: Secondary | ICD-10-CM | POA: Diagnosis not present

## 2024-02-16 DIAGNOSIS — I700 Atherosclerosis of aorta: Secondary | ICD-10-CM | POA: Diagnosis not present

## 2024-02-24 ENCOUNTER — Inpatient Hospital Stay: Attending: Oncology | Admitting: Oncology

## 2024-02-24 DIAGNOSIS — C439 Malignant melanoma of skin, unspecified: Secondary | ICD-10-CM

## 2024-02-24 DIAGNOSIS — D649 Anemia, unspecified: Secondary | ICD-10-CM | POA: Diagnosis not present

## 2024-02-24 DIAGNOSIS — Z85820 Personal history of malignant melanoma of skin: Secondary | ICD-10-CM | POA: Diagnosis not present

## 2024-02-24 DIAGNOSIS — Z85038 Personal history of other malignant neoplasm of large intestine: Secondary | ICD-10-CM | POA: Diagnosis not present

## 2024-02-24 DIAGNOSIS — E11621 Type 2 diabetes mellitus with foot ulcer: Secondary | ICD-10-CM | POA: Insufficient documentation

## 2024-02-24 DIAGNOSIS — Z9221 Personal history of antineoplastic chemotherapy: Secondary | ICD-10-CM | POA: Diagnosis not present

## 2024-02-24 NOTE — Progress Notes (Signed)
 [facility] OFFICE PROGRESS NOTE   Diagnosis: Melanoma  INTERVAL HISTORY:   Destiny [name] returns as scheduled.  She reports the left groin wound is healing.  The wound is now being packed.  She was seen at [facility] on 02/16/2024.  A restaging PET revealed no evidence of melanoma.  There were diffuse groundglass opacities in the lungs.  She has occasional dyspnea.  No cough or fever. She feels the left heel lesion is healing.  Objective:  Vital signs in last 24 hours:  There were no vitals taken for this visit.    Lymphatics: No cervical, supraclavicular, axillary, or inguinal nodes Resp: Lungs clear bilaterally Cardio: Regular rate and rhythm GI: No hepatosplenomegaly, left groin wound with pink granulation tissue and a packing in place, firm induration surrounding the wound, 1 cm cutaneous nodular lesion inferior to the wound Vascular: Edema throughout the left leg  Skin: Healing ulceration of the left foot without nodularity   Lab Results:  Lab Results  Component Value Date   WBC 4.5 11/05/2023   HGB 13.2 11/05/2023   HCT 42.2 11/05/2023   MCV 91.9 11/05/2023   PLT 266 11/05/2023   NEUTROABS 3.0 11/05/2023    CMP  Lab Results  Component Value Date   NA 144 11/05/2023   K 4.0 11/05/2023   CL 103 11/05/2023   CO2 30 11/05/2023   GLUCOSE 109 (H) 11/05/2023   BUN 12 11/05/2023   CREATININE 0.81 11/05/2023   CALCIUM  9.7 11/05/2023   PROT 6.7 11/05/2023   ALBUMIN  4.5 11/05/2023   AST 14 (L) 11/05/2023   ALT 9 11/05/2023   ALKPHOS 64 11/05/2023   BILITOT 0.5 11/05/2023   GFRNONAA >60 11/05/2023   GFRAA >60 09/24/2017    Lab Results  Component Value Date   CEA 1.61 09/03/2023    Lab Results  Component Value Date   INR 1.0 03/26/2021   LABPROT 13.5 03/26/2021    Imaging:  No results found.  Medications: I have reviewed the patient's current medications.   Assessment/Plan: Rectal cancer Colonoscopy 03/06/2022-nonobstructing mass at 12 cm  from the anal verge-biopsy invasive moderately differentiated adenocarcinoma arising within a tubular adenoma with high-grade dysplasia CTs 03/13/2022-no rectal mass seen, no evidence of metastatic disease, small 2-4 mm pulmonary nodules-at least 2 are calcified, likely benign granulomas, nonobstructing stone in the interpolar right renal collecting system MRI pelvis 03/30/2022-tumor at 11-13 cm from the anal verge, T3b, early EMVI?,  No adenopathy Neoadjuvant radiation/capecitabine  04/21/2022-05/28/2022 Xeloda  discontinued 05/15/2022 due to progressive breakdown/ulceration at the left heel. Low anterior resection 08/13/2022-negative for residual cancer,ypT0ypN0, 0/14 nodes margins negative, no distinct mass, 2.1 x 1.5 cm minimally ulcerated firm area of mucosa   2.   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 (reexcision 04/24/2021 and 05/22/2021, final margins clear,pT3b,pN0, ulceration present, satellitosis absent, lymphovascular invasion absent, neurotropism present, tumor infiltrating lymphocytes-nonbrisk, tumor regression absent Nonhealing left heel ulceration beginning August 2023, followed at the wound clinic Punch biopsy of left heel wound 05/11/2023-malignant melanoma in the dermis extending to the deep margins with no clear connection to the epidermis, consistent with local recurrence of melanoma, BRAF V600 negative FNA biopsy of left inguinal lymph node 05/21/2023-atypical cells present, SOX-10 negative PET 819 2024-moderately FDG avid0.7 cm left popliteal node, and 5.1 x 5.2 cm left groin mass, avidity diffusely involving the left heel melanoma resection site Cycle 1 Pembrolizumab  06/12/2023 Cycle 2 Pembrolizumab  07/03/2023 Cycle 3 pembrolizumab  07/23/2023 Cycle 4 Pembrolizumab  08/13/2023 CTs 08/19/2023-increased size of  previously hypermetabolic left inguinal mass, no evidence of new metastatic disease, stable tiny pulmonary nodules-favor benign 09/03/2023 cycle 1  ipilimumab /nivolumab  09/24/2023 cycle 2 ipilimumab /nivolumab  10/15/2023 cycle 3 ipilimumab /nivolumab  CTs 11/03/2023-large left inguinal mass, no other evidence of metastatic disease Biopsy left groin mass 11/13/2023-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 Left inguinal lymph node dissection, femoral vein excision/interposition graft, and sartorius flap 12/17/2023-metastatic melanoma involving fibroadipose tissue and matted lymph nodes, extracapsular extension, positive soft tissue margin 02/16/2024: Whole-body PET at [facility]-postsurgical changes at the left inguinal region with scattered areas of moderate uptake in the resection bed (lateral and inferior margin), focus of moderate uptake in the medial thigh musculature of uncertain significance and in the femoral neurovascular bundle just above the knee of uncertain significance   3.  Diabetes   4.  Laparoscopic gastric band surgery   5.  Left heel ulceration-progressive 05/15/2022.  Xeloda  discontinued.  Followed at the wound clinic.       Disposition: Destiny [name] has a history of metastatic melanoma.  She underwent resection of a left groin mass in March.  A restaging PET last week reveals no clear evidence of recurrent/persistent disease.  Uptake at the resection bed is most likely postoperative.  The left groin wound appears to be healing.  The left heel ulcer is also healing.  She continues follow-up with a home nurse. She is scheduled to see Dr.Olilla on 03/10/2024.  She will return for an office visit here in 4 weeks.  She was noted to have anemia on 02/16/2024.  This is likely secondary to the recent surgery.  We will check a CBC when she returns next month.  Coni Deep, MD  02/24/2024  11:13 AM

## 2024-02-29 DIAGNOSIS — E039 Hypothyroidism, unspecified: Secondary | ICD-10-CM | POA: Diagnosis not present

## 2024-03-02 DIAGNOSIS — C774 Secondary and unspecified malignant neoplasm of inguinal and lower limb lymph nodes: Secondary | ICD-10-CM | POA: Diagnosis not present

## 2024-03-02 DIAGNOSIS — E119 Type 2 diabetes mellitus without complications: Secondary | ICD-10-CM | POA: Diagnosis not present

## 2024-03-02 DIAGNOSIS — C4372 Malignant melanoma of left lower limb, including hip: Secondary | ICD-10-CM | POA: Diagnosis not present

## 2024-03-02 DIAGNOSIS — T8131XD Disruption of external operation (surgical) wound, not elsewhere classified, subsequent encounter: Secondary | ICD-10-CM | POA: Diagnosis not present

## 2024-03-03 DIAGNOSIS — N200 Calculus of kidney: Secondary | ICD-10-CM | POA: Diagnosis not present

## 2024-03-03 DIAGNOSIS — R311 Benign essential microscopic hematuria: Secondary | ICD-10-CM | POA: Diagnosis not present

## 2024-03-09 DIAGNOSIS — T8189XA Other complications of procedures, not elsewhere classified, initial encounter: Secondary | ICD-10-CM | POA: Diagnosis not present

## 2024-03-10 DIAGNOSIS — C4372 Malignant melanoma of left lower limb, including hip: Secondary | ICD-10-CM | POA: Diagnosis not present

## 2024-03-29 DIAGNOSIS — Z7984 Long term (current) use of oral hypoglycemic drugs: Secondary | ICD-10-CM | POA: Diagnosis not present

## 2024-03-29 DIAGNOSIS — C4372 Malignant melanoma of left lower limb, including hip: Secondary | ICD-10-CM | POA: Diagnosis not present

## 2024-03-29 DIAGNOSIS — Z87891 Personal history of nicotine dependence: Secondary | ICD-10-CM | POA: Diagnosis not present

## 2024-03-29 DIAGNOSIS — Z880 Allergy status to penicillin: Secondary | ICD-10-CM | POA: Diagnosis not present

## 2024-03-29 DIAGNOSIS — Z7982 Long term (current) use of aspirin: Secondary | ICD-10-CM | POA: Diagnosis not present

## 2024-03-29 DIAGNOSIS — L988 Other specified disorders of the skin and subcutaneous tissue: Secondary | ICD-10-CM | POA: Diagnosis not present

## 2024-03-29 DIAGNOSIS — Z79899 Other long term (current) drug therapy: Secondary | ICD-10-CM | POA: Diagnosis not present

## 2024-03-29 DIAGNOSIS — E119 Type 2 diabetes mellitus without complications: Secondary | ICD-10-CM | POA: Diagnosis not present

## 2024-03-29 DIAGNOSIS — Z7985 Long-term (current) use of injectable non-insulin antidiabetic drugs: Secondary | ICD-10-CM | POA: Diagnosis not present

## 2024-03-29 DIAGNOSIS — E039 Hypothyroidism, unspecified: Secondary | ICD-10-CM | POA: Diagnosis not present

## 2024-03-29 DIAGNOSIS — G4730 Sleep apnea, unspecified: Secondary | ICD-10-CM | POA: Diagnosis not present

## 2024-03-29 DIAGNOSIS — C7989 Secondary malignant neoplasm of other specified sites: Secondary | ICD-10-CM | POA: Diagnosis not present

## 2024-03-29 DIAGNOSIS — Z885 Allergy status to narcotic agent status: Secondary | ICD-10-CM | POA: Diagnosis not present

## 2024-03-29 DIAGNOSIS — E1140 Type 2 diabetes mellitus with diabetic neuropathy, unspecified: Secondary | ICD-10-CM | POA: Diagnosis not present

## 2024-04-06 ENCOUNTER — Ambulatory Visit: Admitting: Oncology

## 2024-04-06 ENCOUNTER — Telehealth: Payer: Self-pay | Admitting: *Deleted

## 2024-04-06 ENCOUNTER — Other Ambulatory Visit

## 2024-04-06 DIAGNOSIS — C4372 Malignant melanoma of left lower limb, including hip: Secondary | ICD-10-CM | POA: Diagnosis not present

## 2024-04-06 NOTE — Telephone Encounter (Signed)
 Call from nurse navigator, Vina that Destiny [name] had surgery per Dr. Tommas and was determined to have metastatic melanoma on her thigh and could not get clear margins on her heel.  Asking for her to be seen again by Dr. Cloretta re: treatment options. Note on MD desk for review.

## 2024-04-07 ENCOUNTER — Inpatient Hospital Stay: Admitting: Oncology

## 2024-04-07 ENCOUNTER — Inpatient Hospital Stay: Attending: Oncology | Admitting: Nurse Practitioner

## 2024-04-07 VITALS — BP 128/60 | HR 78 | Temp 98.0°F | Resp 18 | Ht 63.0 in | Wt 182.5 lb

## 2024-04-07 DIAGNOSIS — Z9884 Bariatric surgery status: Secondary | ICD-10-CM | POA: Insufficient documentation

## 2024-04-07 DIAGNOSIS — C439 Malignant melanoma of skin, unspecified: Secondary | ICD-10-CM | POA: Diagnosis not present

## 2024-04-07 DIAGNOSIS — E11621 Type 2 diabetes mellitus with foot ulcer: Secondary | ICD-10-CM | POA: Insufficient documentation

## 2024-04-07 DIAGNOSIS — C4372 Malignant melanoma of left lower limb, including hip: Secondary | ICD-10-CM | POA: Insufficient documentation

## 2024-04-07 DIAGNOSIS — R2242 Localized swelling, mass and lump, left lower limb: Secondary | ICD-10-CM | POA: Diagnosis not present

## 2024-04-07 DIAGNOSIS — E119 Type 2 diabetes mellitus without complications: Secondary | ICD-10-CM | POA: Insufficient documentation

## 2024-04-07 DIAGNOSIS — R2241 Localized swelling, mass and lump, right lower limb: Secondary | ICD-10-CM | POA: Insufficient documentation

## 2024-04-07 NOTE — Progress Notes (Signed)
 Patient was given Lenvima, pembrolizumab  handout. Scheduled MRI Brain w wo contrast at [facility] 04/16/24 at 1:30 pm. Arrive 30 minutes early, will notify patient via mychart message.

## 2024-04-07 NOTE — Progress Notes (Signed)
 DISCONTINUE ON PATHWAY REGIMEN - Melanoma and Other Skin Cancers     Cycles 1 through 4: A cycle is every 21 days:     Nivolumab       Ipilimumab     Cycles 5 and beyond: A cycle is every 28 days:     Nivolumab    **Always confirm dose/schedule in your pharmacy ordering system**  PRIOR TREATMENT: [id number]: Nivolumab  1 mg/kg + Ipilimumab  3 mg/kg q21 Days x 4 Doses Followed by Nivolumab  480 mg q28 Days Until Progression, Unacceptable Toxicity, or for up to 2 Years Based on Response  START ON PATHWAY REGIMEN - Melanoma and Other Skin Cancers     A cycle is every 21 days:     Pembrolizumab    **Always confirm dose/schedule in your pharmacy ordering system**  Patient Characteristics: Melanoma, Cutaneous/Unknown Primary, Local/In Transit/Nodal Recurrence, Unresectable, Systemic Therapy Indicated, BRAF V600 Wild Type / BRAF V600 Results Pending or Unknown Disease Classification: Melanoma Disease Subtype: Cutaneous BRAF V600 Mutation Status: BRAF V600 Wild Type (No Mutation) Therapeutic Status: Local/In Transit/Nodal Recurrence Type of Therapy: Systemic Therapy Indicated Intent of Therapy: Non-Curative / Palliative Intent, Discussed with Patient

## 2024-04-07 NOTE — Progress Notes (Signed)
 [facility] OFFICE PROGRESS NOTE   Diagnosis: Melanoma  INTERVAL HISTORY:   Destiny [name] returns for follow-up.  She is seen today to discuss treatment with Lenvima/pembrolizumab  per recommendation from [facility].  On 03/29/2024 she underwent wide local excision of left heel melanoma with skin graft from the right thigh and biopsy x 2 left groin nodules.  Left lateral thigh and left medial thigh punch biopsies showed metastatic malignant melanoma; left posterior heel excision showed metastatic melanoma extending to all edges.  She is changing the left heel dressing twice daily.  Appetite was diminished for a few days after the above procedures, better now.  No nausea or vomiting.  Bowels are moving.  She denies bleeding.  Objective:  Vital signs in last 24 hours:  Blood pressure 128/60, pulse 78, temperature 98 F (36.7 C), temperature source Temporal, resp. rate 18, height 5' 3 (1.6 m), weight 182 lb 8 oz (82.8 kg), SpO2 98%.    Resp: Lungs clear bilaterally. Cardio: Regular rate and rhythm. GI: Abdomen soft and nontender. Vascular: Edema throughout the left leg. Lymph nodes: No cervical, supraclavicular, axillary, or inguinal nodes Skin: Left heel has a dressing in place and is wrapped.  Left upper mid thigh 2 to 3 cm raised rounded lesion with suture intact; left upper medial thigh with 2 to 3 cm raised rounded lesion with suture intact.  Clear bandage over the right thigh skin graft site, blood pooled under the bandage.   Left heel  Lab Results:  Lab Results  Component Value Date   WBC 4.5 11/05/2023   HGB 13.2 11/05/2023   HCT 42.2 11/05/2023   MCV 91.9 11/05/2023   PLT 266 11/05/2023   NEUTROABS 3.0 11/05/2023    Imaging:  No results found.  Medications: I have reviewed the patient's current medications.  Assessment/Plan: Rectal cancer Colonoscopy 03/06/2022-nonobstructing mass at 12 cm from the anal verge-biopsy invasive moderately differentiated  adenocarcinoma arising within a tubular adenoma with high-grade dysplasia CTs 03/13/2022-no rectal mass seen, no evidence of metastatic disease, small 2-4 mm pulmonary nodules-at least 2 are calcified, likely benign granulomas, nonobstructing stone in the interpolar right renal collecting system MRI pelvis 03/30/2022-tumor at 11-13 cm from the anal verge, T3b, early EMVI?,  No adenopathy Neoadjuvant radiation/capecitabine  04/21/2022-05/28/2022 Xeloda  discontinued 05/15/2022 due to progressive breakdown/ulceration at the left heel. Low anterior resection 08/13/2022-negative for residual cancer,ypT0ypN0, 0/14 nodes margins negative, no distinct mass, 2.1 x 1.5 cm minimally ulcerated firm area of mucosa   2.   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 (reexcision 04/24/2021 and 05/22/2021, final margins clear,pT3b,pN0, ulceration present, satellitosis absent, lymphovascular invasion absent, neurotropism present, tumor infiltrating lymphocytes-nonbrisk, tumor regression absent Nonhealing left heel ulceration beginning August 2023, followed at the wound clinic Punch biopsy of left heel wound 05/11/2023-malignant melanoma in the dermis extending to the deep margins with no clear connection to the epidermis, consistent with local recurrence of melanoma, BRAF V600 negative FNA biopsy of left inguinal lymph node 05/21/2023-atypical cells present, SOX-10 negative PET 819 2024-moderately FDG avid0.7 cm left popliteal node, and 5.1 x 5.2 cm left groin mass, avidity diffusely involving the left heel melanoma resection site Cycle 1 Pembrolizumab  06/12/2023 Cycle 2 Pembrolizumab  07/03/2023 Cycle 3 pembrolizumab  07/23/2023 Cycle 4 Pembrolizumab  08/13/2023 CTs 08/19/2023-increased size of previously hypermetabolic left inguinal mass, no evidence of new metastatic disease, stable tiny pulmonary nodules-favor benign 09/03/2023 cycle 1 ipilimumab /nivolumab  09/24/2023 cycle 2  ipilimumab /nivolumab  10/15/2023 cycle 3 ipilimumab /nivolumab  CTs 11/03/2023-large left inguinal mass, no other evidence  of metastatic disease Biopsy left groin mass 11/13/2023-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 Left inguinal lymph node dissection, femoral vein excision/interposition graft, and sartorius flap 12/17/2023-metastatic melanoma involving fibroadipose tissue and matted lymph nodes, extracapsular extension, positive soft tissue margin 02/16/2024: Whole-body PET at [facility]-postsurgical changes at the left inguinal region with scattered areas of moderate uptake in the resection bed (lateral and inferior margin), focus of moderate uptake in the medial thigh musculature of uncertain significance and in the femoral neurovascular bundle just above the knee of uncertain significance 03/29/2024-wide local excision of left heel melanoma with skin graft from the right thigh and biopsy x 2 left groin nodules.  Left lateral thigh and left medial thigh punch biopsies showed metastatic malignant melanoma; left posterior heel excision showed metastatic melanoma extending to all edges. Tentative plan for lenvatinib/pembrolizumab    3.  Diabetes   4.  Laparoscopic gastric band surgery   5.  Left heel ulceration-progressive 05/15/2022.  Xeloda  discontinued.  Followed at the wound clinic.    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  She recently developed new skin lesions at the left upper thigh with biopsies confirming metastatic melanoma.  In addition left posterior heel excision showed metastatic melanoma extending to all edges.  She underwent a left heel skin graft 03/29/2024.  Dr. Cloretta reviewed her case with Dr. Gabriella at [facility].  Dr. Gabriella recommends lenvatinib plus Pembrolizumab .  She has had Pembrolizumab  in the past and is familiar with potential side effects.  We reviewed these again today.  We reviewed potential side effects associated with  lenvatinib including hypertension, bleeding, delayed wound healing, GI perforation, fistula formation, diarrhea, hand-foot syndrome, increased risk of blood clots.  She was provided printed information on both lenvatinib and Pembrolizumab .  Brain MRI ordered to evaluate for metastatic disease prior to beginning above treatment.  She will return for follow-up in the next 2 to 3 weeks for further discussion and to reevaluate healing of the left heel excision/skin graft.  Patient seen with Dr. Cloretta.    Olam Ned ANP/GNP-BC   04/07/2024  2:40 PM   This was a shared visit with Olam Ned.  Ms. Bowdish has metastatic melanoma.  She underwent resection of tumor at the left heel and biopsy of nodular areas at the left upper thigh last week.  The pathology confirmed metastatic melanoma with positive margins.  I discussed the case with Dr.Collichio.  She indicates Ms. Carbonell is not a clinical trial candidate due to the history of rectal cancer in 2023.  She recommends systemic therapy with pembrolizumab  and lenvatinib.  Ms. Cammie will be a candidate for site directed therapy with TVAC if she does not respond to the pembrolizumab /lenvatinib. We reviewed the pembrolizumab  and lenvatinib regimen with Ms. Shawn.  She understands the expected response rate of approximately 20%.  We discussed potential toxicities associated with pembrolizumab .  We discussed the hypertension, thromboembolic disease, bleeding, delayed wound healing, rash, diarrhea, and hematologic toxicity associated with lenvatinib.  She agrees to proceed.  She is scheduled for surgical follow-up over the next 2 weeks.  We will see her in 2-3 weeks with the plan to initiate treatment when the left foot wound has healed.  I was present for greater than 50% of today's visit.  I performed medical decision making.  Arvella Cloretta, MD

## 2024-04-08 ENCOUNTER — Other Ambulatory Visit: Payer: Self-pay

## 2024-04-08 DIAGNOSIS — T8189XA Other complications of procedures, not elsewhere classified, initial encounter: Secondary | ICD-10-CM | POA: Diagnosis not present

## 2024-04-09 ENCOUNTER — Other Ambulatory Visit: Payer: Self-pay

## 2024-04-12 DIAGNOSIS — Z79899 Other long term (current) drug therapy: Secondary | ICD-10-CM | POA: Diagnosis not present

## 2024-04-12 DIAGNOSIS — C4372 Malignant melanoma of left lower limb, including hip: Secondary | ICD-10-CM | POA: Diagnosis not present

## 2024-04-12 DIAGNOSIS — C439 Malignant melanoma of skin, unspecified: Secondary | ICD-10-CM | POA: Diagnosis not present

## 2024-04-13 DIAGNOSIS — C4372 Malignant melanoma of left lower limb, including hip: Secondary | ICD-10-CM | POA: Diagnosis not present

## 2024-04-13 DIAGNOSIS — C439 Malignant melanoma of skin, unspecified: Secondary | ICD-10-CM | POA: Diagnosis not present

## 2024-04-13 DIAGNOSIS — Z79899 Other long term (current) drug therapy: Secondary | ICD-10-CM | POA: Diagnosis not present

## 2024-04-14 DIAGNOSIS — T8189XA Other complications of procedures, not elsewhere classified, initial encounter: Secondary | ICD-10-CM | POA: Diagnosis not present

## 2024-04-16 ENCOUNTER — Ambulatory Visit (HOSPITAL_COMMUNITY)
Admission: RE | Admit: 2024-04-16 | Discharge: 2024-04-16 | Disposition: A | Discharge status: Home / Self Care | Source: Ambulatory Visit | Attending: Nurse Practitioner | Admitting: Nurse Practitioner

## 2024-04-16 DIAGNOSIS — C439 Malignant melanoma of skin, unspecified: Secondary | ICD-10-CM | POA: Insufficient documentation

## 2024-04-16 DIAGNOSIS — R9082 White matter disease, unspecified: Secondary | ICD-10-CM | POA: Diagnosis not present

## 2024-04-16 DIAGNOSIS — G319 Degenerative disease of nervous system, unspecified: Secondary | ICD-10-CM | POA: Diagnosis not present

## 2024-04-16 MED ORDER — GADOBUTROL 1 MMOL/ML IV SOLN
9.0000 mL | Freq: Once | INTRAVENOUS | Status: AC | PRN
  Administered 2024-04-16: 9 mL via INTRAVENOUS

## 2024-04-18 ENCOUNTER — Ambulatory Visit: Payer: Self-pay | Admitting: Oncology

## 2024-04-18 NOTE — Telephone Encounter (Signed)
 Notified that MRI was negative for melanoma. She reports having PET scan soon.

## 2024-04-21 DIAGNOSIS — C4372 Malignant melanoma of left lower limb, including hip: Secondary | ICD-10-CM | POA: Diagnosis not present

## 2024-04-21 DIAGNOSIS — E119 Type 2 diabetes mellitus without complications: Secondary | ICD-10-CM | POA: Diagnosis not present

## 2024-04-21 DIAGNOSIS — T8131XD Disruption of external operation (surgical) wound, not elsewhere classified, subsequent encounter: Secondary | ICD-10-CM | POA: Diagnosis not present

## 2024-04-21 DIAGNOSIS — R918 Other nonspecific abnormal finding of lung field: Secondary | ICD-10-CM | POA: Diagnosis not present

## 2024-04-21 DIAGNOSIS — C7989 Secondary malignant neoplasm of other specified sites: Secondary | ICD-10-CM | POA: Diagnosis not present

## 2024-04-21 DIAGNOSIS — C774 Secondary and unspecified malignant neoplasm of inguinal and lower limb lymph nodes: Secondary | ICD-10-CM | POA: Diagnosis not present

## 2024-04-25 DIAGNOSIS — N200 Calculus of kidney: Secondary | ICD-10-CM | POA: Diagnosis not present

## 2024-04-25 DIAGNOSIS — R311 Benign essential microscopic hematuria: Secondary | ICD-10-CM | POA: Diagnosis not present

## 2024-04-26 DIAGNOSIS — Z809 Family history of malignant neoplasm, unspecified: Secondary | ICD-10-CM | POA: Diagnosis not present

## 2024-04-26 DIAGNOSIS — J984 Other disorders of lung: Secondary | ICD-10-CM | POA: Diagnosis not present

## 2024-04-26 DIAGNOSIS — C439 Malignant melanoma of skin, unspecified: Secondary | ICD-10-CM | POA: Diagnosis not present

## 2024-04-26 DIAGNOSIS — C20 Malignant neoplasm of rectum: Secondary | ICD-10-CM | POA: Diagnosis not present

## 2024-04-26 DIAGNOSIS — Z87891 Personal history of nicotine dependence: Secondary | ICD-10-CM | POA: Diagnosis not present

## 2024-04-26 DIAGNOSIS — Z5111 Encounter for antineoplastic chemotherapy: Secondary | ICD-10-CM | POA: Diagnosis not present

## 2024-04-26 DIAGNOSIS — Z7984 Long term (current) use of oral hypoglycemic drugs: Secondary | ICD-10-CM | POA: Diagnosis not present

## 2024-04-26 DIAGNOSIS — Z880 Allergy status to penicillin: Secondary | ICD-10-CM | POA: Diagnosis not present

## 2024-04-26 DIAGNOSIS — Z79899 Other long term (current) drug therapy: Secondary | ICD-10-CM | POA: Diagnosis not present

## 2024-04-26 DIAGNOSIS — Z7982 Long term (current) use of aspirin: Secondary | ICD-10-CM | POA: Diagnosis not present

## 2024-04-26 DIAGNOSIS — Z79620 Long term (current) use of immunosuppressive biologic: Secondary | ICD-10-CM | POA: Diagnosis not present

## 2024-04-26 DIAGNOSIS — C4371 Malignant melanoma of right lower limb, including hip: Secondary | ICD-10-CM | POA: Diagnosis not present

## 2024-04-26 DIAGNOSIS — Z885 Allergy status to narcotic agent status: Secondary | ICD-10-CM | POA: Diagnosis not present

## 2024-04-26 DIAGNOSIS — Z79890 Hormone replacement therapy (postmenopausal): Secondary | ICD-10-CM | POA: Diagnosis not present

## 2024-04-26 DIAGNOSIS — R911 Solitary pulmonary nodule: Secondary | ICD-10-CM | POA: Diagnosis not present

## 2024-04-26 DIAGNOSIS — Z888 Allergy status to other drugs, medicaments and biological substances status: Secondary | ICD-10-CM | POA: Diagnosis not present

## 2024-04-26 DIAGNOSIS — C3491 Malignant neoplasm of unspecified part of right bronchus or lung: Secondary | ICD-10-CM | POA: Diagnosis not present

## 2024-04-26 DIAGNOSIS — Z7985 Long-term (current) use of injectable non-insulin antidiabetic drugs: Secondary | ICD-10-CM | POA: Diagnosis not present

## 2024-04-27 ENCOUNTER — Inpatient Hospital Stay: Attending: Oncology | Admitting: Nurse Practitioner

## 2024-04-28 DIAGNOSIS — E039 Hypothyroidism, unspecified: Secondary | ICD-10-CM | POA: Diagnosis not present

## 2024-05-12 ENCOUNTER — Other Ambulatory Visit: Payer: Self-pay

## 2024-05-12 ENCOUNTER — Telehealth: Payer: Self-pay

## 2024-05-12 DIAGNOSIS — K5730 Diverticulosis of large intestine without perforation or abscess without bleeding: Secondary | ICD-10-CM

## 2024-05-12 NOTE — Telephone Encounter (Signed)
 Dwayne, RN from [facility], contacted us  to inform that the patient had called expressing concerns about increased swelling in the left leg. Dwayne inquired whether [facility] could order a Doppler ultrasound to rule out a deep vein thrombosis (DVT).

## 2024-05-13 ENCOUNTER — Ambulatory Visit: Payer: Self-pay | Admitting: Oncology

## 2024-05-13 ENCOUNTER — Ambulatory Visit (HOSPITAL_BASED_OUTPATIENT_CLINIC_OR_DEPARTMENT_OTHER)
Admission: RE | Admit: 2024-05-13 | Discharge: 2024-05-13 | Disposition: A | Discharge status: Home / Self Care | Source: Ambulatory Visit | Attending: Oncology | Admitting: Oncology

## 2024-05-13 ENCOUNTER — Other Ambulatory Visit: Payer: Self-pay

## 2024-05-13 ENCOUNTER — Ambulatory Visit (HOSPITAL_BASED_OUTPATIENT_CLINIC_OR_DEPARTMENT_OTHER): Admission: RE | Admit: 2024-05-13 | Source: Ambulatory Visit

## 2024-05-13 DIAGNOSIS — R221 Localized swelling, mass and lump, neck: Secondary | ICD-10-CM

## 2024-05-13 DIAGNOSIS — R1909 Other intra-abdominal and pelvic swelling, mass and lump: Secondary | ICD-10-CM | POA: Diagnosis not present

## 2024-05-13 DIAGNOSIS — M79605 Pain in left leg: Secondary | ICD-10-CM | POA: Diagnosis not present

## 2024-05-17 DIAGNOSIS — I10 Essential (primary) hypertension: Secondary | ICD-10-CM | POA: Diagnosis not present

## 2024-05-17 DIAGNOSIS — M810 Age-related osteoporosis without current pathological fracture: Secondary | ICD-10-CM | POA: Diagnosis not present

## 2024-05-17 DIAGNOSIS — E039 Hypothyroidism, unspecified: Secondary | ICD-10-CM | POA: Diagnosis not present

## 2024-05-17 DIAGNOSIS — E785 Hyperlipidemia, unspecified: Secondary | ICD-10-CM | POA: Diagnosis not present

## 2024-05-17 DIAGNOSIS — E1140 Type 2 diabetes mellitus with diabetic neuropathy, unspecified: Secondary | ICD-10-CM | POA: Diagnosis not present

## 2024-05-20 DIAGNOSIS — C4372 Malignant melanoma of left lower limb, including hip: Secondary | ICD-10-CM | POA: Diagnosis not present

## 2024-05-20 DIAGNOSIS — Z79899 Other long term (current) drug therapy: Secondary | ICD-10-CM | POA: Diagnosis not present

## 2024-05-20 DIAGNOSIS — C439 Malignant melanoma of skin, unspecified: Secondary | ICD-10-CM | POA: Diagnosis not present

## 2024-06-03 DIAGNOSIS — I3139 Other pericardial effusion (noninflammatory): Secondary | ICD-10-CM | POA: Diagnosis not present

## 2024-06-03 DIAGNOSIS — C4372 Malignant melanoma of left lower limb, including hip: Secondary | ICD-10-CM | POA: Diagnosis not present

## 2024-06-04 DIAGNOSIS — T8189XA Other complications of procedures, not elsewhere classified, initial encounter: Secondary | ICD-10-CM | POA: Diagnosis not present

## 2024-06-07 ENCOUNTER — Telehealth: Payer: Self-pay | Admitting: Oncology

## 2024-06-07 DIAGNOSIS — C439 Malignant melanoma of skin, unspecified: Secondary | ICD-10-CM | POA: Diagnosis not present

## 2024-06-07 DIAGNOSIS — T8130XA Disruption of wound, unspecified, initial encounter: Secondary | ICD-10-CM | POA: Diagnosis not present

## 2024-06-07 DIAGNOSIS — Z79899 Other long term (current) drug therapy: Secondary | ICD-10-CM | POA: Diagnosis not present

## 2024-06-07 DIAGNOSIS — G893 Neoplasm related pain (acute) (chronic): Secondary | ICD-10-CM | POA: Diagnosis not present

## 2024-06-07 NOTE — Telephone Encounter (Signed)
 Called VM with PT with Appt Details

## 2024-06-08 ENCOUNTER — Telehealth: Payer: Self-pay | Admitting: Pharmacy Technician

## 2024-06-08 ENCOUNTER — Inpatient Hospital Stay: Attending: Oncology | Admitting: Oncology

## 2024-06-08 ENCOUNTER — Encounter: Payer: Self-pay | Admitting: Oncology

## 2024-06-08 ENCOUNTER — Telehealth: Payer: Self-pay | Admitting: Pharmacist

## 2024-06-08 ENCOUNTER — Other Ambulatory Visit (HOSPITAL_COMMUNITY): Payer: Self-pay

## 2024-06-08 VITALS — BP 123/62 | HR 84 | Temp 97.7°F | Resp 18 | Ht 63.0 in | Wt 193.8 lb

## 2024-06-08 DIAGNOSIS — Z9221 Personal history of antineoplastic chemotherapy: Secondary | ICD-10-CM | POA: Insufficient documentation

## 2024-06-08 DIAGNOSIS — R918 Other nonspecific abnormal finding of lung field: Secondary | ICD-10-CM | POA: Insufficient documentation

## 2024-06-08 DIAGNOSIS — Z85048 Personal history of other malignant neoplasm of rectum, rectosigmoid junction, and anus: Secondary | ICD-10-CM | POA: Insufficient documentation

## 2024-06-08 DIAGNOSIS — C439 Malignant melanoma of skin, unspecified: Secondary | ICD-10-CM

## 2024-06-08 DIAGNOSIS — E11621 Type 2 diabetes mellitus with foot ulcer: Secondary | ICD-10-CM | POA: Insufficient documentation

## 2024-06-08 DIAGNOSIS — C4372 Malignant melanoma of left lower limb, including hip: Secondary | ICD-10-CM | POA: Diagnosis not present

## 2024-06-08 DIAGNOSIS — C774 Secondary and unspecified malignant neoplasm of inguinal and lower limb lymph nodes: Secondary | ICD-10-CM | POA: Diagnosis not present

## 2024-06-08 MED ORDER — LENVATINIB (20 MG DAILY DOSE) 2 X 10 MG PO CPPK: 20.0000 mg | ORAL_CAPSULE | Freq: Every day | ORAL | 0 refills | Status: DC

## 2024-06-08 NOTE — Progress Notes (Signed)
 [facility] OFFICE PROGRESS NOTE   Diagnosis: Melanoma  INTERVAL HISTORY:   Destiny [name] completed treatment with TVEC beginning in July.  There is clinical and radiologic evidence of disease progression.  She saw Dr. Gabriella yesterday.  They discussed comfort care versus a trial of salvage systemic therapy.  Destiny [name] decided to begin a trial of pembrolizumab /lenvatinib .  She has increased pain at the left inguinal region.  The pain was not relieved with tramadol .  She has been rotating Tylenol  and ibuprofen without adequate pain relief.  She started oxycodone  yesterday and reports this helped.  The left heel wound is healing.  She continues to have edema throughout the left leg.  She reports a good appetite.  Objective:  Vital signs in last 24 hours:  Blood pressure 123/62, pulse 84, temperature 97.7 F (36.5 C), temperature source Temporal, resp. rate 18, height 5' 3 (1.6 m), weight 193 lb 12.8 oz (87.9 kg), SpO2 100%.    Lymphatics: No cervical, supraclavicular, or axillary nodes.  Large mass in the left groin with ulcerated fungating tumor in the left upper thigh with oozing of blood Resp: Lungs clear bilaterally Cardio: The rate and rhythm GI: No hepatosplenomegaly Vascular: Edema throughout the left leg  Skin: Healing surgical defect with closed skin at the left heel   Lab Results:  Lab Results  Component Value Date   WBC 4.5 11/05/2023   HGB 13.2 11/05/2023   HCT 42.2 11/05/2023   MCV 91.9 11/05/2023   PLT 266 11/05/2023   NEUTROABS 3.0 11/05/2023    CMP  Lab Results  Component Value Date   NA 144 11/05/2023   K 4.0 11/05/2023   CL 103 11/05/2023   CO2 30 11/05/2023   GLUCOSE 109 (H) 11/05/2023   BUN 12 11/05/2023   CREATININE 0.81 11/05/2023   CALCIUM  9.7 11/05/2023   PROT 6.7 11/05/2023   ALBUMIN  4.5 11/05/2023   AST 14 (L) 11/05/2023   ALT 9 11/05/2023   ALKPHOS 64 11/05/2023   BILITOT 0.5 11/05/2023   GFRNONAA >60 11/05/2023    GFRAA >60 09/24/2017    Lab Results  Component Value Date   CEA 1.61 09/03/2023    Lab Results  Component Value Date   INR 1.0 03/26/2021   LABPROT 13.5 03/26/2021    Imaging:  No results found.  Medications: I have reviewed the patient's current medications.   Assessment/Plan: Rectal cancer Colonoscopy 03/06/2022-nonobstructing mass at 12 cm from the anal verge-biopsy invasive moderately differentiated adenocarcinoma arising within a tubular adenoma with high-grade dysplasia CTs 03/13/2022-no rectal mass seen, no evidence of metastatic disease, small 2-4 mm pulmonary nodules-at least 2 are calcified, likely benign granulomas, nonobstructing stone in the interpolar right renal collecting system MRI pelvis 03/30/2022-tumor at 11-13 cm from the anal verge, T3b, early EMVI?,  No adenopathy Neoadjuvant radiation/capecitabine  04/21/2022-05/28/2022 Xeloda  discontinued 05/15/2022 due to progressive breakdown/ulceration at the left heel. Low anterior resection 08/13/2022-negative for residual cancer,ypT0ypN0, 0/14 nodes margins negative, no distinct mass, 2.1 x 1.5 cm minimally ulcerated firm area of mucosa   2.   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 (reexcision 04/24/2021 and 05/22/2021, final margins clear,pT3b,pN0, ulceration present, satellitosis absent, lymphovascular invasion absent, neurotropism present, tumor infiltrating lymphocytes-nonbrisk, tumor regression absent Nonhealing left heel ulceration beginning August 2023, followed at the wound clinic Punch biopsy of left heel wound 05/11/2023-malignant melanoma in the dermis extending to the deep margins with no clear connection to the epidermis, consistent with local recurrence of melanoma,  BRAF V600 negative FNA biopsy of left inguinal lymph node 05/21/2023-atypical cells present, SOX-10 negative PET 819 2024-moderately FDG avid0.7 cm left popliteal node, and 5.1 x 5.2 cm left groin mass, avidity diffusely  involving the left heel melanoma resection site Cycle 1 Pembrolizumab  06/12/2023 Cycle 2 Pembrolizumab  07/03/2023 Cycle 3 pembrolizumab  07/23/2023 Cycle 4 Pembrolizumab  08/13/2023 CTs 08/19/2023-increased size of previously hypermetabolic left inguinal mass, no evidence of new metastatic disease, stable tiny pulmonary nodules-favor benign 09/03/2023 cycle 1 ipilimumab /nivolumab  09/24/2023 cycle 2 ipilimumab /nivolumab  10/15/2023 cycle 3 ipilimumab /nivolumab  CTs 11/03/2023-large left inguinal mass, no other evidence of metastatic disease Biopsy left groin mass 11/13/2023-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 Left inguinal lymph node dissection, femoral vein excision/interposition graft, and sartorius flap 12/17/2023-metastatic melanoma involving fibroadipose tissue and matted lymph nodes, extracapsular extension, positive soft tissue margin 02/16/2024: Whole-body PET at [facility]-postsurgical changes at the left inguinal region with scattered areas of moderate uptake in the resection bed (lateral and inferior margin), focus of moderate uptake in the medial thigh musculature of uncertain significance and in the femoral neurovascular bundle just above the knee of uncertain significance 03/29/2024-wide local excision of left heel melanoma with skin graft from the right thigh and biopsy x 2 left groin nodules.  Left lateral thigh and left medial thigh punch biopsies showed metastatic malignant melanoma; left posterior heel excision showed metastatic melanoma extending to all edges. 17 2025 PET: Increased volume and avidity of soft tissue implants in the left inguinal region, new subcentimeter soft tissue deposit at the anterolateral left thigh, increased conspicuity of focal uptake at the posterior distal left femur, new right upper lobe nodule 04/26/2024 TVEC 06/03/2024 PET: Increased size of multiple hypermetabolic pulmonary nodules, increased size of hypermetabolic left  internal iliac nodes, increased soft tissue nodularity in the left inguinal crease, new hypermetabolic soft tissue implant at the proximal anterolateral soft tissues adjacent to the primary site, increased size of hypermetabolic nodule at the left medial abductor muscle, unchanged hypermetabolic left popliteal lymph node, stable uptake at the left heel, slight increase in moderate pericardial effusion   3.  Diabetes   4.  Laparoscopic gastric band surgery   5.  Left heel ulceration-progressive 05/15/2022.  Xeloda  discontinued.       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  She completed a trial of TVEC at [facility] with clinical and radiologic evidence of disease progression.  A restaging PET 06/03/2024 revealed progressive disease in the left groin and lungs.  She discussed treatment options with Dr. Gabriella yesterday.  She decided to proceed with a trial of pembrolizumab  and lenvatinib . We reviewed potential toxicities associated with this regimen including the chance of a rash, diarrhea, and various autoimmune toxicities.  We discussed the hypertension, bleeding, delayed wound healing, hand/foot syndrome, and rash associated with lenvatinib .  She agrees to proceed.  The plan is to begin pembrolizumab /lenvatinib  on 06/15/2024.  She will return for an office and lab visit on 06/24/2024.  Destiny [name] will continue oxycodone  for pain.  She will increase the oxycodone  to 5 mg as needed.  She has been prescribed topical Flagyl for the fungating left growing tumor. Arley Hof, MD  06/08/2024  12:40 PM

## 2024-06-08 NOTE — Telephone Encounter (Incomplete)
 Oral Oncology Patient Advocate Encounter   New authorization   Received notification that prior authorization for Lenvima  is required.   PA submitted on CMM via [employer] [id number]  Status is pending     Keyairra Kolinski ([name]) [name], CPhT  [facility], [place], [place] Oral Chemotherapy Patient Advocate Specialist III Phone: [phone]  Fax: [phone]

## 2024-06-08 NOTE — Telephone Encounter (Signed)
 Clinical Pharmacist Practitioner Encounter   Received new prescription for Lenvima  (lenvatinib ) for the treatment of metastatic melanoma in conjunction with pembrolizumab , planned duration until disease progression or unacceptable drug toxicity.  **Phase II LEAP-004 Study of Lenvatinib  Plus Pembrolizumab  for Melanoma With Confirmed Progression on a Programmed Cell Death Protein-1 or Programmed Death Ligand 1 Inhibitor Given as Monotherapy or in Combination  CMP, TSH, T4 from 06/07/24 (Care Everywhere) assessed. Patient should have proteinuria assessed periodically during treatment. Prescription dose and frequency assessed.   Current medication list in Epic reviewed, no DDIs with lenvatinib  identified. **Of note, the metronidazole that patient is using, she is applying topical to her wounds, she is not taking systemically so no interaction concerns.    Evaluated chart and no patient barriers to medication adherence identified.   Prescription has been e-scribed to the [facility] for benefits analysis and approval.  Oral Oncology Clinic will continue to follow for insurance authorization, copayment issues, initial counseling and start date.   Cicely Ortner N. Adella Manolis, PharmD, BCOP, CPP Hematology/Oncology Clinical Pharmacist [facility] Oral Chemotherapy Navigation Clinic [phone]  06/08/2024 7:57 PM

## 2024-06-09 ENCOUNTER — Telehealth: Payer: Self-pay | Admitting: Oncology

## 2024-06-09 NOTE — Telephone Encounter (Signed)
 Called PT and left appt details on VM.

## 2024-06-10 ENCOUNTER — Other Ambulatory Visit: Payer: Self-pay | Admitting: *Deleted

## 2024-06-10 ENCOUNTER — Telehealth: Payer: Self-pay | Admitting: Pharmacy Technician

## 2024-06-10 ENCOUNTER — Encounter: Payer: Self-pay | Admitting: Oncology

## 2024-06-10 ENCOUNTER — Other Ambulatory Visit (HOSPITAL_COMMUNITY): Payer: Self-pay

## 2024-06-10 MED ORDER — FERROUS SULFATE 325 (65 FE) MG PO TBEC: 325.0000 mg | DELAYED_RELEASE_TABLET | Freq: Two times a day (BID) | ORAL | Status: AC

## 2024-06-10 NOTE — Progress Notes (Signed)
 Pharmacist Chemotherapy Monitoring - Initial Assessment    Anticipated start date: 06/15/24   The following has been reviewed per standard work regarding the patient's treatment regimen: The patient's diagnosis, treatment plan and drug doses, and organ/hematologic function Lab orders and baseline tests specific to treatment regimen  The treatment plan start date, drug sequencing, and pre-medications Prior authorization status  Patient's documented medication list, including drug-drug interaction screen and prescriptions for anti-emetics and supportive care specific to the treatment regimen The drug concentrations, fluid compatibility, administration routes, and timing of the medications to be used The patient's access for treatment and lifetime cumulative dose history, if applicable  The patient's medication allergies and previous infusion related reactions, if applicable   Changes made to treatment plan:  N/A  Follow up needed:  N/A   Destiny [name], [facility], 06/10/2024  10:29 AM

## 2024-06-10 NOTE — Telephone Encounter (Signed)
 Oral Oncology Patient Advocate Encounter  Prior Authorization for Lenvima  has been approved.    PA# [id number]  Effective dates: 06/09/2024 through 06/09/2025  Patients co-pay is $1,512.73.    Destiny [name] ([name]) [name], CPhT  [facility], [place], [place] Oral Chemotherapy Patient Advocate Specialist III Phone: [phone]  Fax: [phone]

## 2024-06-10 NOTE — Progress Notes (Signed)
 Destiny [name] brought her financial information and signed the patient assistance forms for Lenvima . Forwarded these to oral oncology team.

## 2024-06-10 NOTE — Telephone Encounter (Addendum)
 Oral Oncology Patient Advocate Encounter   Submitted application for assistance for Lenvima  to [facility] Patient Support.   Application submitted via e-fax to [phone]   [facility] Patient Support phone number [phone].    Oral chemo clinic will continue to check the status until final determination.   Elke Holtry ([name]) [name], CPhT  [place], [place], [place] Oral Chemotherapy Patient Advocate Specialist III Phone: [phone]  Fax: [phone]

## 2024-06-10 NOTE — Telephone Encounter (Signed)
 Oral Oncology Patient Advocate Encounter   Began application for assistance for Lenvima  through [facility] Patient Support.   [facility] Patient Support phone number [phone].  Application will be submitted upon completion of necessary supporting documentation.   Devere Leaven, RN, is assisting in obtaining patient's signatures and a copy of the patient's income document.   Farhad Burleson ([name]) [name], CPhT  [facility], [place], [place] Oral Chemotherapy Patient Advocate Specialist III Phone: [phone]  Fax: [phone]

## 2024-06-11 ENCOUNTER — Other Ambulatory Visit: Payer: Self-pay

## 2024-06-12 ENCOUNTER — Other Ambulatory Visit: Payer: Self-pay

## 2024-06-14 NOTE — Telephone Encounter (Signed)
 Received a call from [facility] Patient Support stating they needed to speak with patient to confirm unaffordability of Lenvima . They called patient but have to LVM. I attempted to conference patient into phone call with agent but no answer. LVM with phone number for patient to call the program back.   They will attempt to call her again on 06/16/24.

## 2024-06-15 ENCOUNTER — Inpatient Hospital Stay: Attending: Oncology

## 2024-06-15 ENCOUNTER — Inpatient Hospital Stay

## 2024-06-15 VITALS — BP 137/60 | HR 80 | Temp 98.9°F | Resp 18 | Ht 63.0 in | Wt 194.6 lb

## 2024-06-15 DIAGNOSIS — C439 Malignant melanoma of skin, unspecified: Secondary | ICD-10-CM

## 2024-06-15 DIAGNOSIS — C4372 Malignant melanoma of left lower limb, including hip: Secondary | ICD-10-CM | POA: Diagnosis not present

## 2024-06-15 DIAGNOSIS — Z5112 Encounter for antineoplastic immunotherapy: Secondary | ICD-10-CM | POA: Insufficient documentation

## 2024-06-15 LAB — CMP (CANCER CENTER ONLY)
ALT: 9 U/L (ref 0–44)
AST: 15 U/L (ref 15–41)
Albumin: 3.9 g/dL (ref 3.5–5.0)
Alkaline Phosphatase: 106 U/L (ref 38–126)
Anion gap: 14 (ref 5–15)
BUN: 18 mg/dL (ref 8–23)
CO2: 24 mmol/L (ref 22–32)
Calcium: 9.3 mg/dL (ref 8.9–10.3)
Chloride: 103 mmol/L (ref 98–111)
Creatinine: 0.86 mg/dL (ref 0.44–1.00)
GFR, Estimated: >60 mL/min (ref >60)
Glucose, Bld: 139 mg/dL — ABNORMAL HIGH (ref 70–99)
Potassium: 4 mmol/L (ref 3.5–5.1)
Sodium: 141 mmol/L (ref 135–145)
Total Bilirubin: 0.3 mg/dL (ref 0.0–1.2)
Total Protein: 6.8 g/dL (ref 6.5–8.1)

## 2024-06-15 LAB — CBC WITH DIFFERENTIAL (CANCER CENTER ONLY)
Abs Immature Granulocytes: 0.03 K/uL (ref 0.00–0.07)
Basophils Absolute: 0 K/uL (ref 0.0–0.1)
Basophils Relative: 0 %
Eosinophils Absolute: 0.1 K/uL (ref 0.0–0.5)
Eosinophils Relative: 1 %
HCT: 30.5 % — ABNORMAL LOW (ref 36.0–46.0)
Hemoglobin: 9 g/dL — ABNORMAL LOW (ref 12.0–15.0)
Immature Granulocytes: 1 %
Lymphocytes Relative: 10 %
Lymphs Abs: 0.6 K/uL — ABNORMAL LOW (ref 0.7–4.0)
MCH: 23.2 pg — ABNORMAL LOW (ref 26.0–34.0)
MCHC: 29.5 g/dL — ABNORMAL LOW (ref 30.0–36.0)
MCV: 78.6 fL — ABNORMAL LOW (ref 80.0–100.0)
Monocytes Absolute: 0.4 K/uL (ref 0.1–1.0)
Monocytes Relative: 6 %
Neutro Abs: 4.9 K/uL (ref 1.7–7.7)
Neutrophils Relative %: 82 %
Platelet Count: 299 K/uL (ref 150–400)
RBC: 3.88 MIL/uL (ref 3.87–5.11)
RDW: 21.3 % — ABNORMAL HIGH (ref 11.5–15.5)
WBC Count: 6.1 K/uL (ref 4.0–10.5)
nRBC: 0 % (ref 0.0–0.2)

## 2024-06-15 LAB — FERRITIN: Ferritin: 39 ng/mL (ref 11–307)

## 2024-06-15 MED ORDER — SODIUM CHLORIDE 0.9 % IV SOLN: INTRAVENOUS | Status: DC

## 2024-06-15 MED ORDER — SODIUM CHLORIDE 0.9 % IV SOLN
200.0000 mg | Freq: Once | INTRAVENOUS | Status: AC
  Administered 2024-06-15: 200 mg via INTRAVENOUS
  Filled 2024-06-15: qty 8

## 2024-06-15 NOTE — Telephone Encounter (Signed)
 Patient approved until 10/12/24.

## 2024-06-15 NOTE — Telephone Encounter (Signed)
 Oral Oncology Patient Advocate Encounter  Patient has been approved for Lenvima  Patient Assistance through [place]. They will be sending medication to the patient.  Lucie Lamer, CPhT [place]  [facility] Pharmacy Technician Patient Advocate Specialist II THERESSA [place] Phone: [phone]  Fax: [phone] [email or web address]

## 2024-06-15 NOTE — Patient Instructions (Signed)
 [facility] - A DEPT OF [facility]  Discharge Instructions: Thank you for choosing [facility] to provide your oncology and hematology care.   If you have a lab appointment with the Cancer Center, please go directly to the Cancer Center and check in at the registration area.   Wear comfortable clothing and clothing appropriate for easy access to any Portacath or PICC line.   We strive to give you quality time with your provider. You may need to reschedule your appointment if you arrive late (15 or more minutes).  Arriving late affects you and other patients whose appointments are after yours.  Also, if you miss three or more appointments without notifying the office, you may be dismissed from the clinic at the provider's discretion.      For prescription refill requests, have your pharmacy contact our office and allow 72 hours for refills to be completed.    Today you received the following chemotherapy and/or immunotherapy agents: keytruda       To help prevent nausea and vomiting after your treatment, we encourage you to take your nausea medication as directed.  BELOW ARE SYMPTOMS THAT SHOULD BE REPORTED IMMEDIATELY: *FEVER GREATER THAN 100.4 F (38 C) OR HIGHER *CHILLS OR SWEATING *NAUSEA AND VOMITING THAT IS NOT CONTROLLED WITH YOUR NAUSEA MEDICATION *UNUSUAL SHORTNESS OF BREATH *UNUSUAL BRUISING OR BLEEDING *URINARY PROBLEMS (pain or burning when urinating, or frequent urination) *BOWEL PROBLEMS (unusual diarrhea, constipation, pain near the anus) TENDERNESS IN MOUTH AND THROAT WITH OR WITHOUT PRESENCE OF ULCERS (sore throat, sores in mouth, or a toothache) UNUSUAL RASH, SWELLING OR PAIN  UNUSUAL VAGINAL DISCHARGE OR ITCHING   Items with * indicate a potential emergency and should be followed up as soon as possible or go to the Emergency Department if any problems should occur.  Please show the CHEMOTHERAPY ALERT CARD or IMMUNOTHERAPY  ALERT CARD at check-in to the Emergency Department and triage nurse.  Should you have questions after your visit or need to cancel or reschedule your appointment, please contact [facility] - A DEPT OF [facility]  Dept: [phone]  and follow the prompts.  Office hours are 8:00 a.m. to 4:30 p.m. Monday - Friday. Please note that voicemails left after 4:00 p.m. may not be returned until the following business day.  We are closed weekends and major holidays. You have access to a nurse at all times for urgent questions. Please call the main number to the clinic Dept: [phone] and follow the prompts.   For any non-urgent questions, you may also contact your provider using MyChart. We now offer e-Visits for anyone 38 and older to request care online for non-urgent symptoms. For details visit [email or web address].   Also download the MyChart app! Go to the app store, search MyChart, open the app, select [facility], and log in with your MyChart username and password.

## 2024-06-16 ENCOUNTER — Other Ambulatory Visit: Payer: Self-pay

## 2024-06-16 NOTE — Telephone Encounter (Signed)
 Clinical Pharmacist Practitioner Encounter   Patient is having Lenvima  delivered today 06/16/24. She will start when she has medication in hand.   Patient Education I spoke with patient for overview of new oral chemotherapy medication: Lenvima  (lenvatinib ) for the treatment of metastatic melanoma in conjunction with pembrolizumab , planned duration until disease progression or unacceptable drug toxicity.   **Phase II LEAP-004 Study of Lenvatinib  Plus Pembrolizumab  for Melanoma With Confirmed Progression on a Programmed Cell Death Protein-1 or Programmed Death Ligand 1 Inhibitor Given as Monotherapy or in Combination  Treatment goal: Palliative  Counseled patient on administration, dosing, side effects, monitoring, drug-food interactions, safe handling, storage, and disposal.  Patient will take/receive: Lenvatinib : Take 2 capsules (20 mg total) by mouth daily Pembrolizumab : 200mg  given every 21 days   Side Effects: Side effects include but not limited to:    Lenvatinib : Hand-foot syndrome, diarrhea, N/V, fatigue, mouth sores, increased BP (HTN) Pembrolizumab : diarrhea, rash, changes in thyroid  function   Diarrhea: discussed with patient that depending on which medication is causing the diarrhea, that determines the best management for the diarrhea. They should call the office if diarrhea occurs, they can start loperamide but still needs to call the office. Hand-foot syndrome: patient will order Udderly Smooth Extra Care 20, recommended they keep hands and feet moisturized  HTN: Currently well controlled, reviewed s/sx of HTN Mouth sores: pt instructed to call the office to obtain magic mouthwash if needed N/V: pt reported having antiemetic medication at home to use as needed.   Reviewed with patient importance of keeping a medication schedule and plan for any missed doses.  After discussion with patient no patient barriers to medication adherence identified.   Distress evaluation:  Distress thermometer not completed during telephone call as patient has been on previous lines of therapy.   Communication and Learning Assessment Primary learner: patient Barriers to learning: No barriers Preferred language: English Learning preferences: Listening Reading   Destiny [name] voiced understanding and appreciation. All questions answered. Medication handout provided.  Provided patient with Oral Chemotherapy Navigation Clinic phone number. Patient knows to call the office with questions or concerns. Oral Chemotherapy Navigation Clinic will continue to follow.  Lilou Kneip N. Nixxon Faria, PharmD, BCOP, CPP Hematology/Oncology Clinical Pharmacist [facility] Oral Chemotherapy Navigation Clinic [phone]  06/16/2024 1:25 PM

## 2024-06-17 ENCOUNTER — Telehealth: Payer: Self-pay | Admitting: *Deleted

## 2024-06-17 MED ORDER — GABAPENTIN 100 MG PO CAPS: 200.0000 mg | ORAL_CAPSULE | Freq: Two times a day (BID) | ORAL | 1 refills | Status: DC

## 2024-06-17 NOTE — Telephone Encounter (Addendum)
 Destiny [name] is calling to inquire if anything else can be done for the burning pain she is having at her groin wound site? Per Dr. Cloretta: Try gabapentin  200 mg bid. Destiny [name] agrees to try this. Sent to [facility]

## 2024-06-29 ENCOUNTER — Other Ambulatory Visit: Payer: Self-pay

## 2024-06-30 ENCOUNTER — Telehealth: Payer: Self-pay | Admitting: *Deleted

## 2024-06-30 NOTE — Telephone Encounter (Signed)
 MD agreed with wound care orders and faxing signed orders.

## 2024-06-30 NOTE — Telephone Encounter (Signed)
 Call from home health nurse requesting MD to provide OK for wound care to the left groin wound. It appears to increasing in size w/serous drainage. She consulted with several wound care nurses and this is the treatment recommended: Clean with wound cleanse Pat dry Apply silver calcium  algonate Cover with large ABD pad and tape. She also sent sent fax with these orders for MD to sign if he agrees.

## 2024-07-01 DIAGNOSIS — T8189XA Other complications of procedures, not elsewhere classified, initial encounter: Secondary | ICD-10-CM | POA: Diagnosis not present

## 2024-07-03 ENCOUNTER — Other Ambulatory Visit: Payer: Self-pay | Admitting: Oncology

## 2024-07-04 ENCOUNTER — Other Ambulatory Visit: Payer: Self-pay

## 2024-07-06 ENCOUNTER — Telehealth: Payer: Self-pay

## 2024-07-06 ENCOUNTER — Inpatient Hospital Stay (HOSPITAL_BASED_OUTPATIENT_CLINIC_OR_DEPARTMENT_OTHER): Admitting: Nurse Practitioner

## 2024-07-06 ENCOUNTER — Other Ambulatory Visit: Payer: Self-pay

## 2024-07-06 ENCOUNTER — Inpatient Hospital Stay

## 2024-07-06 ENCOUNTER — Other Ambulatory Visit

## 2024-07-06 ENCOUNTER — Encounter: Payer: Self-pay | Admitting: Nurse Practitioner

## 2024-07-06 VITALS — BP 119/73 | HR 81 | Temp 98.1°F | Resp 18

## 2024-07-06 VITALS — BP 100/69 | HR 77 | Temp 97.7°F | Resp 18 | Ht 63.0 in | Wt 180.5 lb

## 2024-07-06 DIAGNOSIS — C439 Malignant melanoma of skin, unspecified: Secondary | ICD-10-CM | POA: Diagnosis not present

## 2024-07-06 DIAGNOSIS — Z5112 Encounter for antineoplastic immunotherapy: Secondary | ICD-10-CM | POA: Diagnosis not present

## 2024-07-06 DIAGNOSIS — C20 Malignant neoplasm of rectum: Secondary | ICD-10-CM

## 2024-07-06 LAB — CMP (CANCER CENTER ONLY)
ALT: 5 U/L (ref 0–44)
AST: 15 U/L (ref 15–41)
Albumin: 4 g/dL (ref 3.5–5.0)
Alkaline Phosphatase: 75 U/L (ref 38–126)
Anion gap: 14 (ref 5–15)
BUN: 30 mg/dL — ABNORMAL HIGH (ref 8–23)
CO2: 25 mmol/L (ref 22–32)
Calcium: 8.7 mg/dL — ABNORMAL LOW (ref 8.9–10.3)
Chloride: 101 mmol/L (ref 98–111)
Creatinine: 1.01 mg/dL — ABNORMAL HIGH (ref 0.44–1.00)
GFR, Estimated: 56 mL/min — ABNORMAL LOW (ref >60)
Glucose, Bld: 106 mg/dL — ABNORMAL HIGH (ref 70–99)
Potassium: 3.2 mmol/L — ABNORMAL LOW (ref 3.5–5.1)
Sodium: 140 mmol/L (ref 135–145)
Total Bilirubin: 0.5 mg/dL (ref 0.0–1.2)
Total Protein: 6.8 g/dL (ref 6.5–8.1)

## 2024-07-06 LAB — CBC WITH DIFFERENTIAL (CANCER CENTER ONLY)
Abs Immature Granulocytes: 0.03 K/uL (ref 0.00–0.07)
Basophils Absolute: 0 K/uL (ref 0.0–0.1)
Basophils Relative: 0 %
Eosinophils Absolute: 0.1 K/uL (ref 0.0–0.5)
Eosinophils Relative: 1 %
HCT: 37.1 % (ref 36.0–46.0)
Hemoglobin: 11.1 g/dL — ABNORMAL LOW (ref 12.0–15.0)
Immature Granulocytes: 0 %
Lymphocytes Relative: 11 %
Lymphs Abs: 0.8 K/uL (ref 0.7–4.0)
MCH: 25.3 pg — ABNORMAL LOW (ref 26.0–34.0)
MCHC: 29.9 g/dL — ABNORMAL LOW (ref 30.0–36.0)
MCV: 84.7 fL (ref 80.0–100.0)
Monocytes Absolute: 0.5 K/uL (ref 0.1–1.0)
Monocytes Relative: 6 %
Neutro Abs: 6.1 K/uL (ref 1.7–7.7)
Neutrophils Relative %: 82 %
Platelet Count: 252 K/uL (ref 150–400)
RBC: 4.38 MIL/uL (ref 3.87–5.11)
RDW: 26.2 % — ABNORMAL HIGH (ref 11.5–15.5)
WBC Count: 7.5 K/uL (ref 4.0–10.5)
nRBC: 0 % (ref 0.0–0.2)

## 2024-07-06 LAB — SAMPLE TO BLOOD BANK

## 2024-07-06 LAB — FERRITIN: Ferritin: 72 ng/mL (ref 11–307)

## 2024-07-06 MED ORDER — SODIUM CHLORIDE 0.9 % IV SOLN: INTRAVENOUS | Status: AC

## 2024-07-06 MED ORDER — OXYCODONE HCL 5 MG PO TABS: 2.5000 mg | ORAL_TABLET | ORAL | 0 refills | Status: AC | PRN

## 2024-07-06 NOTE — Telephone Encounter (Signed)
 I contacted [facility] to initiate a referral for the patient. The intake representative confirmed that the referral is visible in EPIC and indicated that no further action is required on my part. I have updated the referral in EPIC for [facility]. The hospice nurse is scheduled to visit the patient on July 08, 2024.

## 2024-07-06 NOTE — Patient Instructions (Signed)

## 2024-07-06 NOTE — Progress Notes (Signed)
 Treatment today was cancelled by the provider and getting 1 liter of NS instead. Referred for hospice care.

## 2024-07-06 NOTE — Progress Notes (Signed)
 [facility] OFFICE PROGRESS NOTE   Diagnosis: Melanoma  INTERVAL HISTORY:   Destiny [name] returns as scheduled.  She completed cycle 1 Pembrolizumab  06/15/2024.  She began lenvatinib  several days after the Pembrolizumab .  She had an episode of nausea/vomiting yesterday.  She had a loose stool this morning.  She notes mouth tenderness, no sores.  She is having frequent headaches.  Energy level is poor.  Oral intake overall is poor.  She continues to have left groin and left foot pain.  Objective:  Vital signs in last 24 hours:  Blood pressure 100/69, pulse 77, temperature 97.7 F (36.5 C), temperature source Temporal, resp. rate 18, height 5' 3 (1.6 m), weight 180 lb 8 oz (81.9 kg), SpO2 96%.    HEENT: No thrush or ulcers. Resp: Lungs clear bilaterally. Cardio: Regular rate and rhythm. GI: No hepatosplenomegaly. Vascular: Edema throughout the left leg. Skin: Large mass in the left groin with ulcerated fungating tumor left upper thigh.  Left heel with large necrotic ulcerated wound.  Decreased skin turgor.   Lab Results:  Lab Results  Component Value Date   WBC 7.5 07/06/2024   HGB 11.1 (L) 07/06/2024   HCT 37.1 07/06/2024   MCV 84.7 07/06/2024   PLT 252 07/06/2024   NEUTROABS 6.1 07/06/2024    Imaging:  No results found.  Medications: I have reviewed the patient's current medications.  Assessment/Plan: Rectal cancer Colonoscopy 03/06/2022-nonobstructing mass at 12 cm from the anal verge-biopsy invasive moderately differentiated adenocarcinoma arising within a tubular adenoma with high-grade dysplasia CTs 03/13/2022-no rectal mass seen, no evidence of metastatic disease, small 2-4 mm pulmonary nodules-at least 2 are calcified, likely benign granulomas, nonobstructing stone in the interpolar right renal collecting system MRI pelvis 03/30/2022-tumor at 11-13 cm from the anal verge, T3b, early EMVI?,  No adenopathy Neoadjuvant radiation/capecitabine   04/21/2022-05/28/2022 Xeloda  discontinued 05/15/2022 due to progressive breakdown/ulceration at the left heel. Low anterior resection 08/13/2022-negative for residual cancer,ypT0ypN0, 0/14 nodes margins negative, no distinct mass, 2.1 x 1.5 cm minimally ulcerated firm area of mucosa   2.   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 (reexcision 04/24/2021 and 05/22/2021, final margins clear,pT3b,pN0, ulceration present, satellitosis absent, lymphovascular invasion absent, neurotropism present, tumor infiltrating lymphocytes-nonbrisk, tumor regression absent Nonhealing left heel ulceration beginning August 2023, followed at the wound clinic Punch biopsy of left heel wound 05/11/2023-malignant melanoma in the dermis extending to the deep margins with no clear connection to the epidermis, consistent with local recurrence of melanoma, BRAF V600 negative FNA biopsy of left inguinal lymph node 05/21/2023-atypical cells present, SOX-10 negative PET 819 2024-moderately FDG avid0.7 cm left popliteal node, and 5.1 x 5.2 cm left groin mass, avidity diffusely involving the left heel melanoma resection site Cycle 1 Pembrolizumab  06/12/2023 Cycle 2 Pembrolizumab  07/03/2023 Cycle 3 pembrolizumab  07/23/2023 Cycle 4 Pembrolizumab  08/13/2023 CTs 08/19/2023-increased size of previously hypermetabolic left inguinal mass, no evidence of new metastatic disease, stable tiny pulmonary nodules-favor benign 09/03/2023 cycle 1 ipilimumab /nivolumab  09/24/2023 cycle 2 ipilimumab /nivolumab  10/15/2023 cycle 3 ipilimumab /nivolumab  CTs 11/03/2023-large left inguinal mass, no other evidence of metastatic disease Biopsy left groin mass 11/13/2023-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 Left inguinal lymph node dissection, femoral vein excision/interposition graft, and sartorius flap 12/17/2023-metastatic melanoma involving fibroadipose tissue and matted lymph nodes,  extracapsular extension, positive soft tissue margin 02/16/2024: Whole-body PET at [facility]-postsurgical changes at the left inguinal region with scattered areas of moderate uptake in the resection bed (lateral and inferior margin), focus of moderate  uptake in the medial thigh musculature of uncertain significance and in the femoral neurovascular bundle just above the knee of uncertain significance 03/29/2024-wide local excision of left heel melanoma with skin graft from the right thigh and biopsy x 2 left groin nodules.  Left lateral thigh and left medial thigh punch biopsies showed metastatic malignant melanoma; left posterior heel excision showed metastatic melanoma extending to all edges. 17 2025 PET: Increased volume and avidity of soft tissue implants in the left inguinal region, new subcentimeter soft tissue deposit at the anterolateral left thigh, increased conspicuity of focal uptake at the posterior distal left femur, new right upper lobe nodule 04/26/2024 TV[facility] 06/03/2024 PET: Increased size of multiple hypermetabolic pulmonary nodules, increased size of hypermetabolic left internal iliac nodes, increased soft tissue nodularity in the left inguinal crease, new hypermetabolic soft tissue implant at the proximal anterolateral soft tissues adjacent to the primary site, increased size of hypermetabolic nodule at the left medial abductor muscle, unchanged hypermetabolic left popliteal lymph node, stable uptake at the left heel, slight increase in moderate pericardial effusion 06/15/2024 cycle 1 Pembrolizumab  every 3 weeks plus lenvatinib     3.  Diabetes   4.  Laparoscopic gastric band surgery   5.  Left heel ulceration-progressive 05/15/2022.  Xeloda  discontinued.     Disposition: Destiny [name] has completed 1 cycle of Pembrolizumab  plus lenvatinib .  She has decided to discontinue treatment.  She would like to focus on quality of life, supportive/comfort care approach.  She agrees to a hospice referral.  We  discussed end-of-life issues.  She will be placed on DNR status.  She appears mildly dehydrated.  She will receive IV fluids today.  Oxycodone  prescription was sent to her pharmacy.  She will return for a follow-up appointment in 3 to 4 weeks.  We are available to see her sooner if needed.  Patient seen with Dr. Cloretta.    Olam Ned ANP/GNP-BC   07/06/2024  11:15 AM This was a shared visit with Olam Ned.  Destiny [name] was interviewed and examined.  She has completed 1 cycle of lenvatinib /pembrolizumab .  There has been no apparent clinical improvement in the metastatic tumor involving the left groin.  Destiny [name] has pain related to the tumor.  She decided to discontinue treatment.  She agrees to a hospice referral for home hospice care.  We reviewed CPR and ACLS.  She will be placed on a no CODE BLUE status.  I was present for greater than 50% of today's visit.  I performed medical decision making.  Arvella Cloretta, MD

## 2024-07-06 NOTE — Progress Notes (Signed)
 Patient seen by Olam Ned NP today  Vitals are within treatment parameters:Yes   Labs are within treatment parameters: Yes K+ 3.2  Treatment plan has been signed: Yes   Per physician team, Patient will not be receiving treatment today.Patient will be received 1L of IV fluid.

## 2024-07-07 ENCOUNTER — Other Ambulatory Visit: Payer: Self-pay

## 2024-07-07 DIAGNOSIS — E119 Type 2 diabetes mellitus without complications: Secondary | ICD-10-CM | POA: Diagnosis not present

## 2024-07-07 DIAGNOSIS — C4372 Malignant melanoma of left lower limb, including hip: Secondary | ICD-10-CM | POA: Diagnosis not present

## 2024-07-07 DIAGNOSIS — T8131XD Disruption of external operation (surgical) wound, not elsewhere classified, subsequent encounter: Secondary | ICD-10-CM | POA: Diagnosis not present

## 2024-07-07 DIAGNOSIS — C774 Secondary and unspecified malignant neoplasm of inguinal and lower limb lymph nodes: Secondary | ICD-10-CM | POA: Diagnosis not present

## 2024-07-28 ENCOUNTER — Other Ambulatory Visit

## 2024-07-28 ENCOUNTER — Ambulatory Visit: Admitting: Nurse Practitioner

## 2024-07-28 ENCOUNTER — Ambulatory Visit

## 2024-07-29 ENCOUNTER — Telehealth: Payer: Self-pay

## 2024-07-29 NOTE — Transitions of Care (Post Inpatient/ED Visit) (Signed)
 07/29/2024  Patient ID: Destiny [name], female   DOB: [birth year], 78 y.o.   MRN: [id number]  Patient on [facility] workbook as hospice discharge.  Called to [facility].  States that patient transferred to [facility] as of yesterday.  [facility], states patient started with them on yesterday.     Kendon Sedeno J. Welton Bord RN, MSN [facility], [facility] RN Care Manager Direct Dial: [phone]  Fax: [phone] Website: [email or web address]

## 2024-08-09 ENCOUNTER — Ambulatory Visit: Admitting: Oncology

## 2024-08-18 ENCOUNTER — Other Ambulatory Visit: Payer: Self-pay | Admitting: Oncology

## 2024-08-24 ENCOUNTER — Telehealth: Payer: Self-pay | Admitting: Pharmacy Technician

## 2024-08-24 ENCOUNTER — Telehealth: Payer: Self-pay | Admitting: Oncology

## 2024-08-24 ENCOUNTER — Telehealth: Payer: Self-pay | Admitting: *Deleted

## 2024-08-24 ENCOUNTER — Other Ambulatory Visit: Payer: Self-pay

## 2024-08-24 ENCOUNTER — Other Ambulatory Visit: Payer: Self-pay | Admitting: *Deleted

## 2024-08-24 DIAGNOSIS — C439 Malignant melanoma of skin, unspecified: Secondary | ICD-10-CM

## 2024-08-24 MED ORDER — LENVATINIB (20 MG DAILY DOSE) 2 X 10 MG PO CPPK: 20.0000 mg | ORAL_CAPSULE | Freq: Every day | ORAL | 1 refills | Status: DC

## 2024-08-24 NOTE — Telephone Encounter (Signed)
 Oral Oncology Patient Advocate Encounter   Submitted prescription for assistance for Lenvima  to [facility] Patient Support.   Prescription has been sent to [facility].   [facility] Patient Support phone number [phone].   I will continue to check the status until final determination.   Trude Cansler ([name]) [name], CPhT  [facility], [place], [facility] - Oral Chemotherapy Patient Advocate Specialist III Phone: [phone]  Fax: [phone]

## 2024-08-24 NOTE — Telephone Encounter (Signed)
 Left detailed message on PT voicemail with appt details

## 2024-08-24 NOTE — Telephone Encounter (Signed)
 Oral Oncology Patient Advocate Encounter  Per Devere Leaven, RN, patient is on hospice.   Re-enrollment prescription has been canceled.  Anastacia Reinecke ([name]) [name], CPhT  [facility], [place], [facility] - Oral Chemotherapy Patient Advocate Specialist III Phone: [phone]  Fax: [phone]

## 2024-08-24 NOTE — Telephone Encounter (Signed)
 Received request on Lenvima  refill on patient from oral oncology team. Called patient and confirmed she is Hospice now and no longer on the medication.

## 2024-08-25 ENCOUNTER — Other Ambulatory Visit: Payer: Self-pay

## 2024-09-02 ENCOUNTER — Inpatient Hospital Stay

## 2024-09-02 ENCOUNTER — Inpatient Hospital Stay: Admitting: Nurse Practitioner

## 2024-09-14 ENCOUNTER — Encounter: Payer: Self-pay | Admitting: *Deleted

## 2024-09-14 NOTE — Progress Notes (Signed)
 Received call from [place] at [facility]. She spoke w/son and he states [name] is alive and well. In a facility with Hospice as well. Son appreciated that this office was asking about her.

## 2024-09-15 ENCOUNTER — Other Ambulatory Visit: Payer: Self-pay

## 2024-10-05 ENCOUNTER — Encounter: Payer: Self-pay | Admitting: Oncology

## 2024-10-13 DEATH — deceased

## 2024-10-23 ENCOUNTER — Encounter: Payer: Self-pay | Admitting: Oncology
